# Patient Record
Sex: Female | Born: 1975
Health system: Southern US, Community
[De-identification: ages and names within clinical notes are randomized; demographics above are authoritative.]

## PROBLEM LIST (undated history)

## (undated) DIAGNOSIS — K259 Gastric ulcer, unspecified as acute or chronic, without hemorrhage or perforation: Secondary | ICD-10-CM

## (undated) DIAGNOSIS — R51 Headache: Secondary | ICD-10-CM

## (undated) DIAGNOSIS — K449 Diaphragmatic hernia without obstruction or gangrene: Secondary | ICD-10-CM

## (undated) DIAGNOSIS — J45909 Unspecified asthma, uncomplicated: Secondary | ICD-10-CM

## (undated) DIAGNOSIS — I10 Essential (primary) hypertension: Secondary | ICD-10-CM

## (undated) DIAGNOSIS — M726 Necrotizing fasciitis: Secondary | ICD-10-CM

## (undated) DIAGNOSIS — R519 Headache, unspecified: Secondary | ICD-10-CM

## (undated) DIAGNOSIS — I82409 Acute embolism and thrombosis of unspecified deep veins of unspecified lower extremity: Secondary | ICD-10-CM

## (undated) DIAGNOSIS — F419 Anxiety disorder, unspecified: Secondary | ICD-10-CM

## (undated) DIAGNOSIS — D649 Anemia, unspecified: Secondary | ICD-10-CM

## (undated) DIAGNOSIS — I2699 Other pulmonary embolism without acute cor pulmonale: Secondary | ICD-10-CM

## (undated) DIAGNOSIS — R000 Tachycardia, unspecified: Secondary | ICD-10-CM

## (undated) HISTORY — DX: Headache, unspecified: R51.9

## (undated) HISTORY — PX: CHOLECYSTECTOMY: SHX55

## (undated) HISTORY — DX: Anxiety disorder, unspecified: F41.9

## (undated) HISTORY — DX: Headache: R51

## (undated) SURGERY — IRRIGATION AND DEBRIDEMENT BUTTOCKS
Anesthesia: General | Laterality: Left

---

## 2013-05-11 ENCOUNTER — Emergency Department (HOSPITAL_COMMUNITY)
Admission: EM | Admit: 2013-05-11 | Discharge: 2013-05-11 | Disposition: A | Discharge status: Home / Self Care | Payer: 59 | Attending: Emergency Medicine | Admitting: Emergency Medicine

## 2013-05-11 ENCOUNTER — Encounter (HOSPITAL_COMMUNITY): Payer: Self-pay | Admitting: *Deleted

## 2013-05-11 DIAGNOSIS — Z8711 Personal history of peptic ulcer disease: Secondary | ICD-10-CM | POA: Insufficient documentation

## 2013-05-11 DIAGNOSIS — Z9889 Other specified postprocedural states: Secondary | ICD-10-CM | POA: Insufficient documentation

## 2013-05-11 DIAGNOSIS — I10 Essential (primary) hypertension: Secondary | ICD-10-CM | POA: Insufficient documentation

## 2013-05-11 DIAGNOSIS — Z862 Personal history of diseases of the blood and blood-forming organs and certain disorders involving the immune mechanism: Secondary | ICD-10-CM | POA: Insufficient documentation

## 2013-05-11 DIAGNOSIS — R109 Unspecified abdominal pain: Secondary | ICD-10-CM | POA: Insufficient documentation

## 2013-05-11 DIAGNOSIS — Z872 Personal history of diseases of the skin and subcutaneous tissue: Secondary | ICD-10-CM | POA: Insufficient documentation

## 2013-05-11 DIAGNOSIS — J45909 Unspecified asthma, uncomplicated: Secondary | ICD-10-CM | POA: Insufficient documentation

## 2013-05-11 DIAGNOSIS — K259 Gastric ulcer, unspecified as acute or chronic, without hemorrhage or perforation: Secondary | ICD-10-CM | POA: Insufficient documentation

## 2013-05-11 DIAGNOSIS — Z8719 Personal history of other diseases of the digestive system: Secondary | ICD-10-CM | POA: Insufficient documentation

## 2013-05-11 DIAGNOSIS — Z86711 Personal history of pulmonary embolism: Secondary | ICD-10-CM | POA: Insufficient documentation

## 2013-05-11 DIAGNOSIS — N390 Urinary tract infection, site not specified: Secondary | ICD-10-CM | POA: Insufficient documentation

## 2013-05-11 DIAGNOSIS — Z8679 Personal history of other diseases of the circulatory system: Secondary | ICD-10-CM | POA: Insufficient documentation

## 2013-05-11 DIAGNOSIS — Z9089 Acquired absence of other organs: Secondary | ICD-10-CM | POA: Insufficient documentation

## 2013-05-11 DIAGNOSIS — Z3202 Encounter for pregnancy test, result negative: Secondary | ICD-10-CM | POA: Insufficient documentation

## 2013-05-11 HISTORY — DX: Unspecified asthma, uncomplicated: J45.909

## 2013-05-11 HISTORY — DX: Other pulmonary embolism without acute cor pulmonale: I26.99

## 2013-05-11 HISTORY — DX: Essential (primary) hypertension: I10

## 2013-05-11 HISTORY — DX: Gastric ulcer, unspecified as acute or chronic, without hemorrhage or perforation: K25.9

## 2013-05-11 HISTORY — DX: Diaphragmatic hernia without obstruction or gangrene: K44.9

## 2013-05-11 HISTORY — DX: Tachycardia, unspecified: R00.0

## 2013-05-11 HISTORY — DX: Anemia, unspecified: D64.9

## 2013-05-11 LAB — LIPASE, BLOOD: Lipase: 24 U/L (ref 11–59)

## 2013-05-11 LAB — COMPREHENSIVE METABOLIC PANEL
Albumin: 3.8 g/dL (ref 3.5–5.2)
BUN: 13 mg/dL (ref 6–23)
Creatinine, Ser: 0.63 mg/dL (ref 0.50–1.10)
Total Bilirubin: 0.2 mg/dL — ABNORMAL LOW (ref 0.3–1.2)
Total Protein: 8.2 g/dL (ref 6.0–8.3)

## 2013-05-11 LAB — CBC WITH DIFFERENTIAL/PLATELET
Basophils Relative: 0 % (ref 0–1)
Eosinophils Absolute: 0.4 10*3/uL (ref 0.0–0.7)
HCT: 41.1 % (ref 36.0–46.0)
Hemoglobin: 13.5 g/dL (ref 12.0–15.0)
MCH: 31.8 pg (ref 26.0–34.0)
MCHC: 32.8 g/dL (ref 30.0–36.0)
Monocytes Absolute: 0.7 10*3/uL (ref 0.1–1.0)
Monocytes Relative: 6 % (ref 3–12)

## 2013-05-11 LAB — URINALYSIS, ROUTINE W REFLEX MICROSCOPIC
Ketones, ur: NEGATIVE mg/dL
Leukocytes, UA: NEGATIVE
Nitrite: POSITIVE — AB
Urobilinogen, UA: 0.2 mg/dL (ref 0.0–1.0)
pH: 6 (ref 5.0–8.0)

## 2013-05-11 LAB — URINE MICROSCOPIC-ADD ON

## 2013-05-11 MED ORDER — PROMETHAZINE HCL 25 MG PO TABS: 25.0000 mg | ORAL_TABLET | Freq: Four times a day (QID) | ORAL | Status: DC | PRN

## 2013-05-11 MED ORDER — ONDANSETRON 8 MG PO TBDP
8.0000 mg | ORAL_TABLET | Freq: Once | ORAL | Status: AC
  Administered 2013-05-11: 8 mg via ORAL
  Filled 2013-05-11: qty 1

## 2013-05-11 MED ORDER — PROMETHAZINE HCL 12.5 MG PO TABS
25.0000 mg | ORAL_TABLET | Freq: Once | ORAL | Status: AC
  Administered 2013-05-11: 25 mg via ORAL

## 2013-05-11 MED ORDER — SUCRALFATE 1 GM/10ML PO SUSP
1.0000 g | Freq: Once | ORAL | Status: AC
  Administered 2013-05-11: 1 g via ORAL
  Filled 2013-05-11: qty 10

## 2013-05-11 MED ORDER — CEPHALEXIN 500 MG PO CAPS: 500.0000 mg | ORAL_CAPSULE | Freq: Four times a day (QID) | ORAL | Status: DC

## 2013-05-11 MED ORDER — OXYCODONE-ACETAMINOPHEN 5-325 MG PO TABS
1.0000 | ORAL_TABLET | ORAL | Status: DC | PRN
Start: 2013-05-11 — End: 2014-12-22

## 2013-05-11 MED ORDER — SUCRALFATE 1 GM/10ML PO SUSP: 1.0000 g | Freq: Two times a day (BID) | ORAL | Status: DC

## 2013-05-11 MED ORDER — PROMETHAZINE HCL 25 MG RE SUPP: 25.0000 mg | Freq: Four times a day (QID) | RECTAL | Status: DC | PRN

## 2013-05-11 MED ORDER — OXYCODONE-ACETAMINOPHEN 5-325 MG PO TABS
2.0000 | ORAL_TABLET | Freq: Once | ORAL | Status: AC
  Administered 2013-05-11: 2 via ORAL
  Filled 2013-05-11 (×2): qty 1

## 2013-05-11 MED ORDER — PROMETHAZINE HCL 12.5 MG PO TABS
ORAL_TABLET | ORAL | Status: AC
  Filled 2013-05-11: qty 2

## 2013-05-11 NOTE — ED Notes (Signed)
MD at bedside. 

## 2013-05-11 NOTE — ED Notes (Signed)
Pt c/o nausea abd pain that became worse three days ago, has hx of "stomach ulcers',

## 2013-05-11 NOTE — ED Provider Notes (Signed)
History  This chart was scribed for [name], MD by Era Bumpers, ED scribe. This patient was seen in room APA06/APA06 and the patient's care was started at 1850.  CSN: [id number] Arrival date & time 05/11/13  [id number]  First MD Initiated Contact with Patient 05/11/13 1850     Chief Complaint  Patient presents with  . Abdominal Pain  . Nausea   Patient is a 37 y.o. female presenting with abdominal pain. The history is provided by the patient. No language interpreter was used.  Abdominal Pain This is a recurrent problem. The current episode started more than 2 days ago. The problem occurs constantly. The problem has been gradually worsening. Associated symptoms include abdominal pain (diffuse cramping abdominal pain). Pertinent negatives include no chest pain, no headaches and no shortness of breath. Nothing aggravates the symptoms. Nothing relieves the symptoms. She has tried nothing for the symptoms.   HPI Comments: Megan Ortiz is a 37 y.o. female who presents to the Emergency Department w/hx of stomach ulcers complaining of moderate, diffuse, constant, gradually worsening, cramping abdominal pain onset 3 days ago. She states a hx of ulcers and hx of bleeding ulcers. She states has had signs that her ulcers might be flaring up again since 3 days ago and states diffuse abdominal pain but denies emesis, nausea, black/bloody stool, fever, CP, SOB, cough, dysuria, vaginal bleeding or d/c. She is not on blood thinners (previous h/o PE). She has a hx of cholecystectomy, C-section. She has an upcoming GI appt w/Dr. Collene Mares  Past Medical History  Diagnosis Date  . Gastric ulcer   . Hypertension   . Tachycardia   . PE (pulmonary embolism)   . Asthma   . Anemia   . Hiatal hernia    Past Surgical History  Procedure Laterality Date  . Cholecystectomy    . Cesarean section     No family history on file. History  Substance Use Topics  . Smoking status: Never Smoker   . Smokeless  tobacco: Not on file  . Alcohol Use: Yes   OB History   Grav Para Term Preterm Abortions TAB SAB Ect Mult Living                 Review of Systems  Constitutional: Negative for fever and chills.  HENT: Negative for congestion and rhinorrhea.   Respiratory: Negative for cough and shortness of breath.   Cardiovascular: Negative for chest pain.  Gastrointestinal: Positive for abdominal pain (diffuse cramping abdominal pain). Negative for nausea, vomiting, diarrhea, blood in stool and anal bleeding.  Genitourinary: Negative for dysuria, frequency, flank pain, vaginal bleeding and vaginal discharge.  Skin: Negative for color change and pallor.  Neurological: Negative for syncope, weakness and headaches.  All other systems reviewed and are negative.   A complete 10 system review of systems was obtained and all systems are negative except as noted in the HPI and PMH.   Allergies  Review of patient's allergies indicates no known allergies.  Home Medications  No current outpatient prescriptions on file. Triage Vitals: BP 152/90  Pulse 94  Temp(Src) 97.8 F (36.6 C) (Oral)  Resp 18  Ht 5\' 5"  (1.651 m)  Wt 450 lb (204.119 kg)  BMI 74.88 kg/m2  SpO2 93%  LMP 04/10/2013 BP 141/65  Pulse 65  Temp(Src) 97.8 F (36.6 C) (Oral)  Resp 16  Ht 5\' 5"  (1.651 m)  Wt 450 lb (204.119 kg)  BMI 74.88 kg/m2  SpO2 95%  LMP 04/10/2013  Physical Exam CONSTITUTIONAL: Well developed/well nourished HEAD AND FACE: Normocephalic/atraumatic EYES: EOMI/PERRL ENMT: Mucous membranes moist NECK: supple no meningeal signs SPINE:entire spine nontender CV: S1/S2 noted, no murmurs/rubs/gallops noted LUNGS: Lungs are clear to auscultation bilaterally, no apparent distress ABDOMEN: soft, mild epigastric tenderness, no rebound or guarding, morbidly obese  Well healed scar to RUQ GU:no cva tenderness NEURO: Pt is awake/alert, moves all extremitiesx4 EXTREMITIES: pulses normal, full ROM SKIN: warm, color  normal PSYCH: no abnormalities of mood noted  ED Course  Procedures (including critical care time) DIAGNOSTIC STUDIES: Oxygen Saturation is 93% on room air, adequate by my interpretation.    COORDINATION OF CARE: At 53 PM Discussed treatment plan with patient which includes zofran, blood wok, UA. Patient agrees.   Labs Reviewed  CBC WITH DIFFERENTIAL  COMPREHENSIVE METABOLIC PANEL  LIPASE, BLOOD  URINALYSIS, ROUTINE W REFLEX MICROSCOPIC  LACTIC ACID, PLASMA   Pt improved, no vomiting.  Abdominal pain resolved.  Her abdomen was nontender on repeat assessment She had no lower abdominal tenderness on exam No CP reported She reports similar to prior episodes.  I doubt acute abdominal process She requests phenergen, carafate and pain meds Return precautions given to patient   MDM  Nursing notes including past medical history and social history reviewed and considered in documentation Labs/vital reviewed and considered     Date: 05/11/2013  Rate: 59  Rhythm: sinus bradycardia  QRS Axis: normal  Intervals: normal  ST/T Wave abnormalities: normal  Conduction Disutrbances:none  Narrative Interpretation:   Old EKG Reviewed: none available at time of interpretation    I personally performed the services described in this documentation, which was scribed in my presence. The recorded information has been reviewed and is accurate.        [name], MD 05/12/13 Quentin Mulling

## 2013-05-13 LAB — URINE CULTURE: Colony Count: >=100000

## 2013-05-14 NOTE — ED Notes (Signed)
+   Urine Treated with Cephalexin -STS-Chart appended per protocol MD.

## 2013-06-11 ENCOUNTER — Emergency Department (HOSPITAL_COMMUNITY)
Admission: EM | Admit: 2013-06-11 | Discharge: 2013-06-11 | Disposition: A | Discharge status: Home / Self Care | Payer: 59 | Attending: Emergency Medicine | Admitting: Emergency Medicine

## 2013-06-11 ENCOUNTER — Encounter (HOSPITAL_COMMUNITY): Payer: Self-pay | Admitting: Emergency Medicine

## 2013-06-11 DIAGNOSIS — Z79899 Other long term (current) drug therapy: Secondary | ICD-10-CM | POA: Insufficient documentation

## 2013-06-11 DIAGNOSIS — Z23 Encounter for immunization: Secondary | ICD-10-CM | POA: Insufficient documentation

## 2013-06-11 DIAGNOSIS — Y929 Unspecified place or not applicable: Secondary | ICD-10-CM | POA: Insufficient documentation

## 2013-06-11 DIAGNOSIS — Z8679 Personal history of other diseases of the circulatory system: Secondary | ICD-10-CM | POA: Insufficient documentation

## 2013-06-11 DIAGNOSIS — W260XXA Contact with knife, initial encounter: Secondary | ICD-10-CM | POA: Insufficient documentation

## 2013-06-11 DIAGNOSIS — Z86711 Personal history of pulmonary embolism: Secondary | ICD-10-CM | POA: Insufficient documentation

## 2013-06-11 DIAGNOSIS — Y939 Activity, unspecified: Secondary | ICD-10-CM | POA: Insufficient documentation

## 2013-06-11 DIAGNOSIS — S61209A Unspecified open wound of unspecified finger without damage to nail, initial encounter: Secondary | ICD-10-CM | POA: Insufficient documentation

## 2013-06-11 DIAGNOSIS — I10 Essential (primary) hypertension: Secondary | ICD-10-CM | POA: Insufficient documentation

## 2013-06-11 DIAGNOSIS — S61219A Laceration without foreign body of unspecified finger without damage to nail, initial encounter: Secondary | ICD-10-CM

## 2013-06-11 DIAGNOSIS — Z862 Personal history of diseases of the blood and blood-forming organs and certain disorders involving the immune mechanism: Secondary | ICD-10-CM | POA: Insufficient documentation

## 2013-06-11 DIAGNOSIS — J45909 Unspecified asthma, uncomplicated: Secondary | ICD-10-CM | POA: Insufficient documentation

## 2013-06-11 DIAGNOSIS — Z8719 Personal history of other diseases of the digestive system: Secondary | ICD-10-CM | POA: Insufficient documentation

## 2013-06-11 MED ORDER — TETANUS-DIPHTH-ACELL PERTUSSIS 5-2.5-18.5 LF-MCG/0.5 IM SUSP
0.5000 mL | Freq: Once | INTRAMUSCULAR | Status: AC
  Administered 2013-06-11: 0.5 mL via INTRAMUSCULAR
  Filled 2013-06-11: qty 0.5

## 2013-06-11 MED ORDER — TRAMADOL HCL 50 MG PO TABS: 50.0000 mg | ORAL_TABLET | Freq: Four times a day (QID) | ORAL | Status: DC | PRN

## 2013-06-11 MED ORDER — LIDOCAINE HCL (PF) 1 % IJ SOLN
INTRAMUSCULAR | Status: AC
  Filled 2013-06-11: qty 5

## 2013-06-11 NOTE — ED Provider Notes (Signed)
CSN: [id number]     Arrival date & time 06/11/13  1249 History     First MD Initiated Contact with Patient 06/11/13 1332     Chief Complaint  Patient presents with  . Laceration   (Consider location/radiation/quality/duration/timing/severity/associated sxs/prior Treatment) Patient is a 37 y.o. female presenting with skin laceration. The history is provided by the patient.  Laceration Location:  Finger Finger laceration location:  L thumb Length (cm):  1 cm Depth:  Cutaneous Quality comment:  Flap Bleeding: controlled   Time since incident:  1 hour Laceration mechanism:  Knife Pain details:    Quality:  Throbbing   Severity:  Mild   Timing:  Constant   Progression:  Unchanged Foreign body present:  No foreign bodies Relieved by:  Pressure Worsened by:  Movement Ineffective treatments:  None tried Tetanus status:  Out of date   Past Medical History  Diagnosis Date  . Gastric ulcer   . Hypertension   . Tachycardia   . PE (pulmonary embolism)   . Asthma   . Anemia   . Hiatal hernia    Past Surgical History  Procedure Laterality Date  . Cholecystectomy    . Cesarean section     History reviewed. No pertinent family history. History  Substance Use Topics  . Smoking status: Never Smoker   . Smokeless tobacco: Not on file  . Alcohol Use: Yes     Comment: occa   OB History   Grav Para Term Preterm Abortions TAB SAB Ect Mult Living                 Review of Systems  Constitutional: Negative for fever and chills.  Musculoskeletal: Negative for back pain, joint swelling and arthralgias.  Skin: Positive for wound.       Laceration   Neurological: Negative for dizziness, weakness and numbness.  Hematological: Does not bruise/bleed easily.  All other systems reviewed and are negative.    Allergies  Review of patient's allergies indicates no known allergies.  Home Medications   Current Outpatient Rx  Name  Route  Sig  Dispense  Refill  . albuterol  (VENTOLIN HFA) 108 (90 BASE) MCG/ACT inhaler   Inhalation   Inhale 2 puffs into the lungs every 6 (six) hours as needed for wheezing.         . cephALEXin (KEFLEX) 500 MG capsule   Oral   Take 1 capsule (500 mg total) by mouth 4 (four) times daily.   28 capsule   0   . LISINOPRIL PO   Oral   Take 1 tablet by mouth daily.         . montelukast (SINGULAIR) 10 MG tablet   Oral   Take 10 mg by mouth daily.         . Multiple Vitamin (MULTIVITAMIN WITH MINERALS) TABS   Oral   Take 1 tablet by mouth daily.         Marland Kitchen NORTRIPTYLINE HCL PO   Oral   Take 1 tablet by mouth at bedtime.         Marland Kitchen omeprazole (PRILOSEC) 20 MG capsule   Oral   Take 20-40 mg by mouth 3 (three) times daily.         Marland Kitchen oxyCODONE-acetaminophen (PERCOCET/ROXICET) 5-325 MG per tablet   Oral   Take 1 tablet by mouth every 4 (four) hours as needed for pain.   5 tablet   0   . promethazine (PHENERGAN) 25 MG suppository  Rectal   Place 1 suppository (25 mg total) rectally every 6 (six) hours as needed for nausea.   12 each   0   . promethazine (PHENERGAN) 25 MG tablet   Oral   Take 1 tablet (25 mg total) by mouth every 6 (six) hours as needed for nausea.   30 tablet   0   . sertraline (ZOLOFT) 50 MG tablet   Oral   Take 50 mg by mouth daily.         Marland Kitchen spironolactone (ALDACTONE) 50 MG tablet   Oral   Take 50 mg by mouth daily.         . sucralfate (CARAFATE) 1 GM/10ML suspension   Oral   Take 10 mLs (1 g total) by mouth 2 (two) times daily.   420 mL   0   . Topiramate (TOPAMAX PO)   Oral   Take 2 tablets by mouth at bedtime.          BP 122/80  Pulse 103  Temp(Src) 98.5 F (36.9 C) (Oral)  Resp 20  SpO2 94%  LMP 05/21/2013 Physical Exam  Nursing note and vitals reviewed. Constitutional: She is oriented to person, place, and time. She appears well-developed and well-nourished. No distress.  HENT:  Head: Normocephalic and atraumatic.  Cardiovascular: Normal rate,  regular rhythm, normal heart sounds and intact distal pulses.   No murmur heard. Pulmonary/Chest: Effort normal and breath sounds normal. No respiratory distress.  Musculoskeletal: She exhibits no edema and no tenderness.       Left hand: She exhibits tenderness and laceration. She exhibits normal range of motion, no bony tenderness, normal two-point discrimination, normal capillary refill, no deformity and no swelling. Normal sensation noted. Normal strength noted.       Hands: Neurological: She is alert and oriented to person, place, and time. She exhibits normal muscle tone. Coordination normal.  Skin: Skin is warm. Laceration noted.  See MS exam    ED Course   Procedures (including critical care time)  Labs Reviewed - No data to display No results found. No diagnosis found.  MDM   LACERATION REPAIR Performed by: Taryll Reichenberger L. Authorized by: Hale Bogus Consent: Verbal consent obtained. Risks and benefits: risks, benefits and alternatives were discussed Consent given by: patient Patient identity confirmed: provided demographic data Prepped and Draped in normal sterile fashion Wound explored  Laceration Location:distal left thumb Laceration Length: 1 cm  No Foreign Bodies seen or palpated    Local anesthetic: none   Irrigation method: syringe Amount of cleaning: standard  Skin closure: steri-strips     Patient tolerance: Patient tolerated the procedure well with no immediate complications.   1 cm flap type laceration to the ulnar aspect of distal left thumb.  No edema, NV intact.  Full ROM of the thumb.  Bleeding controlled, dressing applied.  Td updated.     Deontaye Civello L. Reuel Lamadrid, PA-C 06/13/13 1324

## 2013-06-11 NOTE — ED Notes (Signed)
Pt cut left thumb on knife prior to arrival. Bleeding controlled.

## 2013-06-17 NOTE — ED Provider Notes (Signed)
Medical screening examination/treatment/procedure(s) were performed by non-physician practitioner and as supervising physician I was immediately available for consultation/collaboration.  Varney Biles, MD 06/17/13 [phone]

## 2014-12-22 ENCOUNTER — Ambulatory Visit (INDEPENDENT_AMBULATORY_CARE_PROVIDER_SITE_OTHER): Payer: 59 | Admitting: Psychiatry

## 2014-12-22 ENCOUNTER — Encounter (HOSPITAL_COMMUNITY): Payer: Self-pay | Admitting: Psychiatry

## 2014-12-22 VITALS — BP 130/84 | HR 93 | Ht 65.0 in | Wt >= 6400 oz

## 2014-12-22 DIAGNOSIS — F411 Generalized anxiety disorder: Secondary | ICD-10-CM

## 2014-12-22 MED ORDER — SERTRALINE HCL 100 MG PO TABS: 100.0000 mg | ORAL_TABLET | Freq: Two times a day (BID) | ORAL | Status: DC
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
## 2015-01-19 ENCOUNTER — Encounter (HOSPITAL_COMMUNITY): Payer: Self-pay | Admitting: Psychiatry

## 2015-01-19 ENCOUNTER — Ambulatory Visit (INDEPENDENT_AMBULATORY_CARE_PROVIDER_SITE_OTHER): Payer: 59 | Admitting: Psychiatry

## 2015-01-19 VITALS — BP 138/90 | HR 86 | Ht 65.0 in | Wt >= 6400 oz

## 2015-01-19 DIAGNOSIS — F411 Generalized anxiety disorder: Secondary | ICD-10-CM

## 2015-01-19 MED ORDER — SERTRALINE HCL 100 MG PO TABS: 100.0000 mg | ORAL_TABLET | Freq: Two times a day (BID) | ORAL | Status: AC
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
## 2015-02-24 ENCOUNTER — Telehealth (HOSPITAL_COMMUNITY): Payer: Self-pay | Admitting: *Deleted

## 2015-03-19 ENCOUNTER — Ambulatory Visit (HOSPITAL_COMMUNITY): Payer: Self-pay | Admitting: Psychiatry

## 2017-02-15 ENCOUNTER — Encounter (HOSPITAL_COMMUNITY): Payer: Self-pay

## 2017-02-15 ENCOUNTER — Emergency Department (HOSPITAL_COMMUNITY): Payer: Self-pay

## 2017-02-15 ENCOUNTER — Inpatient Hospital Stay (HOSPITAL_COMMUNITY)
Admission: EM | Admit: 2017-02-15 | Discharge: 2017-03-09 | DRG: 853 | Disposition: A | Discharge status: Skilled Nursing Facility | Payer: Self-pay | Attending: Internal Medicine | Admitting: Internal Medicine

## 2017-02-15 DIAGNOSIS — Z8711 Personal history of peptic ulcer disease: Secondary | ICD-10-CM

## 2017-02-15 DIAGNOSIS — Z978 Presence of other specified devices: Secondary | ICD-10-CM

## 2017-02-15 DIAGNOSIS — L304 Erythema intertrigo: Secondary | ICD-10-CM | POA: Diagnosis present

## 2017-02-15 DIAGNOSIS — D509 Iron deficiency anemia, unspecified: Secondary | ICD-10-CM | POA: Diagnosis present

## 2017-02-15 DIAGNOSIS — Z9049 Acquired absence of other specified parts of digestive tract: Secondary | ICD-10-CM

## 2017-02-15 DIAGNOSIS — R6521 Severe sepsis with septic shock: Secondary | ICD-10-CM | POA: Diagnosis present

## 2017-02-15 DIAGNOSIS — E8809 Other disorders of plasma-protein metabolism, not elsewhere classified: Secondary | ICD-10-CM | POA: Diagnosis present

## 2017-02-15 DIAGNOSIS — J9601 Acute respiratory failure with hypoxia: Secondary | ICD-10-CM

## 2017-02-15 DIAGNOSIS — Z818 Family history of other mental and behavioral disorders: Secondary | ICD-10-CM

## 2017-02-15 DIAGNOSIS — T502X5A Adverse effect of carbonic-anhydrase inhibitors, benzothiadiazides and other diuretics, initial encounter: Secondary | ICD-10-CM | POA: Diagnosis present

## 2017-02-15 DIAGNOSIS — F418 Other specified anxiety disorders: Secondary | ICD-10-CM | POA: Diagnosis present

## 2017-02-15 DIAGNOSIS — M726 Necrotizing fasciitis: Secondary | ICD-10-CM | POA: Diagnosis present

## 2017-02-15 DIAGNOSIS — E870 Hyperosmolality and hypernatremia: Secondary | ICD-10-CM | POA: Diagnosis present

## 2017-02-15 DIAGNOSIS — R000 Tachycardia, unspecified: Secondary | ICD-10-CM | POA: Diagnosis present

## 2017-02-15 DIAGNOSIS — E871 Hypo-osmolality and hyponatremia: Secondary | ICD-10-CM | POA: Diagnosis present

## 2017-02-15 DIAGNOSIS — F329 Major depressive disorder, single episode, unspecified: Secondary | ICD-10-CM | POA: Diagnosis present

## 2017-02-15 DIAGNOSIS — K219 Gastro-esophageal reflux disease without esophagitis: Secondary | ICD-10-CM | POA: Diagnosis present

## 2017-02-15 DIAGNOSIS — D72829 Elevated white blood cell count, unspecified: Secondary | ICD-10-CM

## 2017-02-15 DIAGNOSIS — D62 Acute posthemorrhagic anemia: Secondary | ICD-10-CM

## 2017-02-15 DIAGNOSIS — Z811 Family history of alcohol abuse and dependence: Secondary | ICD-10-CM

## 2017-02-15 DIAGNOSIS — E876 Hypokalemia: Secondary | ICD-10-CM | POA: Diagnosis present

## 2017-02-15 DIAGNOSIS — Y95 Nosocomial condition: Secondary | ICD-10-CM | POA: Diagnosis present

## 2017-02-15 DIAGNOSIS — J9602 Acute respiratory failure with hypercapnia: Secondary | ICD-10-CM | POA: Diagnosis present

## 2017-02-15 DIAGNOSIS — Z4659 Encounter for fitting and adjustment of other gastrointestinal appliance and device: Secondary | ICD-10-CM

## 2017-02-15 DIAGNOSIS — E43 Unspecified severe protein-calorie malnutrition: Secondary | ICD-10-CM | POA: Diagnosis present

## 2017-02-15 DIAGNOSIS — E662 Morbid (severe) obesity with alveolar hypoventilation: Secondary | ICD-10-CM | POA: Diagnosis present

## 2017-02-15 DIAGNOSIS — B954 Other streptococcus as the cause of diseases classified elsewhere: Secondary | ICD-10-CM | POA: Diagnosis present

## 2017-02-15 DIAGNOSIS — L03317 Cellulitis of buttock: Secondary | ICD-10-CM | POA: Diagnosis present

## 2017-02-15 DIAGNOSIS — Z6841 Body Mass Index (BMI) 40.0 and over, adult: Secondary | ICD-10-CM

## 2017-02-15 DIAGNOSIS — R739 Hyperglycemia, unspecified: Secondary | ICD-10-CM

## 2017-02-15 DIAGNOSIS — G43909 Migraine, unspecified, not intractable, without status migrainosus: Secondary | ICD-10-CM | POA: Diagnosis present

## 2017-02-15 DIAGNOSIS — L98419 Non-pressure chronic ulcer of buttock with unspecified severity: Secondary | ICD-10-CM | POA: Diagnosis present

## 2017-02-15 DIAGNOSIS — J189 Pneumonia, unspecified organism: Secondary | ICD-10-CM | POA: Diagnosis present

## 2017-02-15 DIAGNOSIS — E872 Acidosis: Secondary | ICD-10-CM | POA: Diagnosis present

## 2017-02-15 DIAGNOSIS — I509 Heart failure, unspecified: Secondary | ICD-10-CM | POA: Diagnosis present

## 2017-02-15 DIAGNOSIS — Z86711 Personal history of pulmonary embolism: Secondary | ICD-10-CM

## 2017-02-15 DIAGNOSIS — A419 Sepsis, unspecified organism: Principal | ICD-10-CM | POA: Diagnosis present

## 2017-02-15 DIAGNOSIS — J9811 Atelectasis: Secondary | ICD-10-CM | POA: Diagnosis present

## 2017-02-15 DIAGNOSIS — Z972 Presence of dental prosthetic device (complete) (partial): Secondary | ICD-10-CM

## 2017-02-15 DIAGNOSIS — Z87891 Personal history of nicotine dependence: Secondary | ICD-10-CM

## 2017-02-15 DIAGNOSIS — F4323 Adjustment disorder with mixed anxiety and depressed mood: Secondary | ICD-10-CM

## 2017-02-15 DIAGNOSIS — J9690 Respiratory failure, unspecified, unspecified whether with hypoxia or hypercapnia: Secondary | ICD-10-CM

## 2017-02-15 DIAGNOSIS — Z79899 Other long term (current) drug therapy: Secondary | ICD-10-CM

## 2017-02-15 DIAGNOSIS — M7989 Other specified soft tissue disorders: Secondary | ICD-10-CM | POA: Diagnosis present

## 2017-02-15 DIAGNOSIS — I10 Essential (primary) hypertension: Secondary | ICD-10-CM

## 2017-02-15 DIAGNOSIS — Z886 Allergy status to analgesic agent status: Secondary | ICD-10-CM

## 2017-02-15 DIAGNOSIS — N179 Acute kidney failure, unspecified: Secondary | ICD-10-CM

## 2017-02-15 DIAGNOSIS — I110 Hypertensive heart disease with heart failure: Secondary | ICD-10-CM | POA: Diagnosis present

## 2017-02-15 DIAGNOSIS — R197 Diarrhea, unspecified: Secondary | ICD-10-CM | POA: Diagnosis present

## 2017-02-15 DIAGNOSIS — J45909 Unspecified asthma, uncomplicated: Secondary | ICD-10-CM

## 2017-02-15 DIAGNOSIS — L0231 Cutaneous abscess of buttock: Secondary | ICD-10-CM | POA: Diagnosis present

## 2017-02-15 LAB — CBC WITH DIFFERENTIAL/PLATELET
BASOS ABS: 0 10*3/uL (ref 0.0–0.1)
Basophils Relative: 0 %
EOS ABS: 0 10*3/uL (ref 0.0–0.7)
EOS PCT: 0 %
HEMATOCRIT: 30.6 % — AB (ref 36.0–46.0)
Hemoglobin: 9.8 g/dL — ABNORMAL LOW (ref 12.0–15.0)
Lymphocytes Relative: 3 %
Lymphs Abs: 1 10*3/uL (ref 0.7–4.0)
MCH: 27.7 pg (ref 26.0–34.0)
MCHC: 32 g/dL (ref 30.0–36.0)
MCV: 86.4 fL (ref 78.0–100.0)
Monocytes Absolute: 1.7 10*3/uL — ABNORMAL HIGH (ref 0.1–1.0)
Monocytes Relative: 4 %
Neutro Abs: 36.6 10*3/uL — ABNORMAL HIGH (ref 1.7–7.7)
Neutrophils Relative %: 93 %
PLATELETS: 343 10*3/uL (ref 150–400)
RBC: 3.54 MIL/uL — AB (ref 3.87–5.11)
RDW: 16.9 % — AB (ref 11.5–15.5)
WBC: 39.2 10*3/uL — AB (ref 4.0–10.5)

## 2017-02-15 LAB — COMPREHENSIVE METABOLIC PANEL
ALBUMIN: 3 g/dL — AB (ref 3.5–5.0)
ALT: 19 U/L (ref 14–54)
ANION GAP: 13 (ref 5–15)
AST: 33 U/L (ref 15–41)
Alkaline Phosphatase: 69 U/L (ref 38–126)
BUN: 31 mg/dL — AB (ref 6–20)
CHLORIDE: 104 mmol/L (ref 101–111)
CO2: 15 mmol/L — AB (ref 22–32)
Calcium: 9 mg/dL (ref 8.9–10.3)
Creatinine, Ser: 2.41 mg/dL — ABNORMAL HIGH (ref 0.44–1.00)
GFR calc Af Amer: 28 mL/min — ABNORMAL LOW (ref >60)
GFR calc non Af Amer: 24 mL/min — ABNORMAL LOW (ref >60)
GLUCOSE: 109 mg/dL — AB (ref 65–99)
POTASSIUM: 3.5 mmol/L (ref 3.5–5.1)
SODIUM: 132 mmol/L — AB (ref 135–145)
TOTAL PROTEIN: 7.3 g/dL (ref 6.5–8.1)
Total Bilirubin: 0.8 mg/dL (ref 0.3–1.2)

## 2017-02-15 LAB — BASIC METABOLIC PANEL
BUN: 31 mg/dL — AB (ref 4–21)
Creatinine: 2.4 mg/dL — AB (ref 0.5–1.1)
GLUCOSE: 109 mg/dL
POTASSIUM: 3.5 mmol/L (ref 3.4–5.3)
SODIUM: 132 mmol/L — AB (ref 137–147)

## 2017-02-15 LAB — CBC AND DIFFERENTIAL
HCT: 31 % — AB (ref 36–46)
HEMOGLOBIN: 9.8 g/dL — AB (ref 12.0–16.0)
Platelets: 343 10*3/uL (ref 150–399)
WBC: 39.2 10^3/mL

## 2017-02-15 LAB — I-STAT BETA HCG BLOOD, ED (MC, WL, AP ONLY): I-stat hCG, quantitative: 8 m[IU]/mL — ABNORMAL HIGH (ref <5)

## 2017-02-15 LAB — HEPATIC FUNCTION PANEL
ALK PHOS: 69 U/L (ref 25–125)
ALT: 19 U/L (ref 7–35)
AST: 33 U/L (ref 13–35)
Bilirubin, Total: 0.8 mg/dL

## 2017-02-15 LAB — I-STAT CG4 LACTIC ACID, ED: Lactic Acid, Venous: 4.69 mmol/L (ref 0.5–1.9)

## 2017-02-15 MED ORDER — SODIUM CHLORIDE 0.9 % IV BOLUS (SEPSIS)
1000.0000 mL | Freq: Once | INTRAVENOUS | Status: AC
  Administered 2017-02-15: 1000 mL via INTRAVENOUS

## 2017-02-15 MED ORDER — VANCOMYCIN HCL IN DEXTROSE 1-5 GM/200ML-% IV SOLN
1000.0000 mg | Freq: Once | INTRAVENOUS | Status: AC
Start: 2017-02-15 — End: 2017-02-15
  Administered 2017-02-15: 1000 mg via INTRAVENOUS
  Filled 2017-02-15: qty 200

## 2017-02-15 MED ORDER — ONDANSETRON 4 MG PO TBDP
ORAL_TABLET | ORAL | Status: AC
  Filled 2017-02-15: qty 1

## 2017-02-15 MED ORDER — NOREPINEPHRINE 4 MG/250ML-% IV SOLN
INTRAVENOUS | Status: AC
  Filled 2017-02-15: qty 250

## 2017-02-15 MED ORDER — HYDROMORPHONE HCL 1 MG/ML IJ SOLN
1.0000 mg | Freq: Once | INTRAMUSCULAR | Status: AC
  Administered 2017-02-15: 1 mg via INTRAVENOUS
  Filled 2017-02-15: qty 1

## 2017-02-15 MED ORDER — CLINDAMYCIN PHOSPHATE 600 MG/50ML IV SOLN
600.0000 mg | Freq: Once | INTRAVENOUS | Status: AC
  Administered 2017-02-15: 600 mg via INTRAVENOUS
  Filled 2017-02-15: qty 50

## 2017-02-15 MED ORDER — ACETAMINOPHEN 500 MG PO TABS
1000.0000 mg | ORAL_TABLET | Freq: Once | ORAL | Status: AC
  Administered 2017-02-15: 1000 mg via ORAL
  Filled 2017-02-15: qty 2

## 2017-02-15 MED ORDER — PIPERACILLIN-TAZOBACTAM 3.375 G IVPB 30 MIN
3.3750 g | Freq: Once | INTRAVENOUS | Status: AC
Start: 2017-02-15 — End: 2017-02-15
  Administered 2017-02-15: 3.375 g via INTRAVENOUS
  Filled 2017-02-15: qty 50

## 2017-02-15 MED ORDER — ONDANSETRON 4 MG PO TBDP
4.0000 mg | ORAL_TABLET | Freq: Once | ORAL | Status: AC | PRN
  Administered 2017-02-15: 4 mg via ORAL

## 2017-02-15 MED ORDER — SODIUM CHLORIDE 0.9 % IV SOLN: INTRAVENOUS | Status: DC | PRN

## 2017-02-15 MED ORDER — DEXTROSE 5 % IV SOLN
0.0000 ug/min | INTRAVENOUS | Status: DC
  Administered 2017-02-16: 2 ug/min via INTRAVENOUS
  Filled 2017-02-15: qty 4

## 2017-02-15 NOTE — Consult Note (Addendum)
SURGICAL CONSULTATION NOTE (initial) - cpt: 99254  HISTORY OF PRESENT ILLNESS (HPI):  41 y.o. supermorbidly obese female presented to [facility] ED with Left buttock pain x 4 days, which she attributed to scrubbing too hard with an abrasive "sugar scrub". 3 days ago, she began experiencing subjective fever and shaking chills. 2 days ago, she first noticed a blister over the painful site, and today she began feeling significantly worse pain, fevers, nausea, and overall poor. The painful blistered site also today began to turn a dark gray/black color, and the affected area grew larger throughout today. Despite weighing 450 lbs, patient denies history of diabetes, and patient likewise denies any prior skin infection except for wound infection following cholecystectomy. Though automated BP cuff initially reported SBP of 63 mmHg, patient denied dizziness or lightheadedness and likewise denied CP, SOB, cough, abdominal pain, or dysuria. ED physician reports CT scan was requested, but patient exceeds the weight limit +/- size limitation of the CT scanner.  Surgery is consulted by ED physician Dr. Oleta Mouse in this context for evaluation and management of likely necrotizing fasciitis.  PAST MEDICAL HISTORY (PMH):  Past Medical History:  Diagnosis Date  . Anemia   . Anxiety   . Asthma   . Gastric ulcer   . Headache   . Hiatal hernia   . Hypertension   . PE (pulmonary embolism)   . Tachycardia      PAST SURGICAL HISTORY [facility]):  Past Surgical History:  Procedure Laterality Date  . CESAREAN SECTION    . CHOLECYSTECTOMY       MEDICATIONS:  Prior to Admission medications   Medication Sig Start Date End Date Taking? Authorizing Provider  acetaminophen (TYLENOL) 500 MG tablet Take 500 mg by mouth every 6 (six) hours as needed for moderate pain.   Yes Historical Provider, MD  albuterol (VENTOLIN HFA) 108 (90 BASE) MCG/ACT inhaler Inhale 2 puffs into the lungs every 6 (six) hours as needed for wheezing.   Yes  Historical Provider, MD  cyclobenzaprine (FLEXERIL) 10 MG tablet Take 10 mg by mouth 3 (three) times daily as needed for muscle spasms.  12/24/16  Yes Historical Provider, MD  diltiazem (CARDIZEM CD) 180 MG 24 hr capsule Take 180 mg by mouth daily. 01/08/15  Yes Historical Provider, MD  lisinopril (PRINIVIL,ZESTRIL) 40 MG tablet Take 80 mg by mouth daily.   Yes Historical Provider, MD  montelukast (SINGULAIR) 10 MG tablet Take 10 mg by mouth daily.   Yes Historical Provider, MD  Multiple Vitamin (MULTIVITAMIN WITH MINERALS) TABS Take 1 tablet by mouth daily.   Yes Historical Provider, MD  nortriptyline (PAMELOR) 25 MG capsule Take 25 mg by mouth at bedtime.  11/24/14  Yes Historical Provider, MD  sertraline (ZOLOFT) 100 MG tablet Take 1 tablet (100 mg total) by mouth 2 (two) times daily. 01/19/15  Yes Cloria Spring, MD  spironolactone (ALDACTONE) 50 MG tablet Take 50 mg by mouth daily.   Yes Historical Provider, MD  sucralfate (CARAFATE) 1 g tablet Take 1 g by mouth 4 (four) times daily.  08/23/16  Yes Historical Provider, MD  topiramate (TOPAMAX) 50 MG tablet Take 50 mg by mouth daily. 01/24/17  Yes Historical Provider, MD     ALLERGIES:  Allergies  Allergen Reactions  . Aspirin     Due to hx of stomach ulcers     SOCIAL HISTORY:  Social History   Social History  . Marital status: Married    Spouse name: N/A  . Number of  children: N/A  . Years of education: N/A   Occupational History  . Not on file.   Social History Main Topics  . Smoking status: Former Research scientist (life sciences)  . Smokeless tobacco: Never Used     Comment: QUIT ABOUT 5 YEARS AGO  . Alcohol use Yes     Comment: occa  . Drug use: No  . Sexual activity: Yes    Birth control/ protection: None, Condom   Other Topics Concern  . Not on file   Social History Narrative  . No narrative on file    The patient currently resides (home / rehab facility / nursing home): Home  The patient normally is (ambulatory / bedbound): Ambulatory    FAMILY HISTORY:  Family History  Problem Relation Age of Onset  . Depression Mother   . Alcohol abuse Mother     REVIEW OF SYSTEMS:  Constitutional: denies weight loss, fever, chills, or sweats  Eyes: denies any other vision changes, history of eye injury  ENT: denies sore throat, hearing problems  Respiratory: denies shortness of breath, wheezing  Cardiovascular: denies chest pain, palpitations  Gastrointestinal: denies abdominal pain or N/V, reports +flatus with some loose BM's over the past few days Genitourinary: denies burning with urination or urinary frequency Musculoskeletal: denies any other joint pains or cramps  Skin: denies any other rashes or skin discolorations except as per HPI Neurological: denies any other headache, dizziness, weakness  Psychiatric: denies any other depression, anxiety   All other review of systems were negative   VITAL SIGNS:  Temp:  [98.1 F (36.7 C)-101.5 F (38.6 C)] 101.5 F (38.6 C) (04/05 1942) Pulse Rate:  [110-132] 129 (04/05 2245) Resp:  [22-31] 24 (04/05 2245) BP: (62-115)/(30-77) 77/43 (04/05 2245) SpO2:  [22 %-100 %] 93 % (04/05 2245) Weight:  [204.1 kg (450 lb)] 204.1 kg (450 lb) (04/05 1928)     Height: 5\' 5"  (165.1 cm) Weight: (!) 204.1 kg (450 lb) BMI (Calculated): 75   INTAKE/OUTPUT:  This shift: Total I/O In: 3050 [IV Piggyback:3050] Out: -   Last 2 shifts: @IOLAST2SHIFTS @   PHYSICAL EXAM:  Constitutional:  -- Supermorbidly obese body habitus  -- Awake, alert, and oriented x3; diaphoretic Eyes:  -- Pupils equally round and reactive to light  -- No scleral icterus  Ear, nose, and throat:  -- No jugular venous distension  Pulmonary:  -- No crackles  -- Equally reduced breath sounds bilaterally, attributable to body habitus -- Breathing non-labored at rest Cardiovascular:  -- S1, S2 present  -- No pericardial rubs Gastrointestinal:  -- Abdomen soft, nontender, supermorbidly obese, no guarding/rebound  -- No  abdominal masses appreciated, pulsatile or otherwise  Musculoskeletal and Integumentary:  -- Wounds or skin discoloration: moderately tender 8 cm x 5 cm grayish-colored Left buttock wound with possibly a small amount of crepitus, though exam is limited by patient's body habitus -- Extremities: B/L UE and LE FROM, hands and feet warm  Neurologic:  -- Motor function: intact and symmetric -- Sensation: intact and symmetric  Pulse/Doppler Exam: (p=palpable; d=doppler signals; 0=none)     Right   Left   Brach  p   p   Rad  p   p   Labs:  CBC:  Lab Results  Component Value Date   WBC 39.2 (H) 02/15/2017   RBC 3.54 (L) 02/15/2017   CMP Latest Ref Rng & Units 02/15/2017 05/11/2013  Glucose 65 - 99 mg/dL 109(H) 105(H)  BUN 6 - 20 mg/dL 31(H) 13  Creatinine  0.44 - 1.00 mg/dL 2.41(H) 0.63  Sodium 135 - 145 mmol/L 132(L) 135  Potassium 3.5 - 5.1 mmol/L 3.5 4.1  Chloride 101 - 111 mmol/L 104 100  CO2 22 - 32 mmol/L 15(L) 22  Calcium 8.9 - 10.3 mg/dL 9.0 9.6  Total Protein 6.5 - 8.1 g/dL 7.3 8.2  Total Bilirubin 0.3 - 1.2 mg/dL 0.8 0.2(L)  Alkaline Phos 38 - 126 U/L 69 75  AST 15 - 41 U/L 33 10  ALT 14 - 54 U/L 19 8    Imaging studies:  No pertinent imaging available to review  Assessment/Plan: (ICD-10's: M72.6, R65.21) 41 y.o. female with septic shock and leukocytosis likely attributable to Left buttock necrotizing soft tissue infection, complicated by pertinent comorbidities including supermorbid obesity (BMI 75), HTN, asthma, prior PE not currently on anticoagulation, GERD with PUD associated with hiatal hernia, and generalized anxiety disorder.   - NPO, IVF rescuscitation, vasopressors as per ED  - all risks, benefits, and alternatives to intra-arterial pressure monitoring catheter discussed with patient, and Right radial intra-arterial catheter sterilely inserted and secured with consistently excellent waveform and SBP of 78 - 84 mmHg  - it was explained to patient and her husband  that necrotizing soft tissue infection can be a fatal problem if not promptly treated and that debridement in the absence of imaging would be both diagnostic/confirmatory and likely therapeutic in this context  - if unable to perform emergent excisional debridement due to anesthesia safety concerns and limited critical care resources, patient will need to be transferred for critical care and surgical management asap  - please call if any questions  - DVT prophylaxis  All of the above findings and recommendations were discussed with the patient, her husband, and ED physician, and all of patient's and her husband's questions were answered to their expressed satisfaction.  Thank you for the opportunity to participate in this patient's care.   -- Marilynne Drivers Rosana Hoes, MD, [place]: [facility] Office: [phone]

## 2017-02-15 NOTE — ED Triage Notes (Signed)
Pt called [facility] for wound on her left buttock that the patient states she noticed 2 days ago.  Pt also c/o nausea and some diarrhea.

## 2017-02-15 NOTE — ED Provider Notes (Addendum)
[place] DEPT Provider Note   CSN: [id number] Arrival date & time: 02/15/17  1925     History   Chief Complaint Chief Complaint  Patient presents with  . Skin Ulcer    HPI Megan Ortiz is a 41 y.o. female.  HPI 41 year old female who presents with right buttock ulcer. She has a history of morbid obesity, migraine headaches, and hypertension. States that 3 days ago she noticed a small boil to her right buttock, and increasing pain. She has been feeling feverish, and yesterday evening began to have night sweats. Denies any nausea or vomiting, abdominal pain, dysuria or urinary frequency, chest pain, difficulty breathing or cough. Has noted mild diarrhea over the past day or 2.   Past Medical History:  Diagnosis Date  . Anemia   . Anxiety   . Asthma   . Gastric ulcer   . Headache   . Hiatal hernia   . Hypertension   . PE (pulmonary embolism)   . Tachycardia     Patient Active Problem List   Diagnosis Date Noted  . Septic shock ([place]) 02/16/2017  . Generalized anxiety disorder 12/22/2014    Past Surgical History:  Procedure Laterality Date  . CESAREAN SECTION    . CHOLECYSTECTOMY      OB History    No data available       Home Medications    Prior to Admission medications   Medication Sig Start Date End Date Taking? Authorizing Provider  acetaminophen (TYLENOL) 500 MG tablet Take 500 mg by mouth every 6 (six) hours as needed for moderate pain.   Yes Historical Provider, MD  albuterol (VENTOLIN HFA) 108 (90 BASE) MCG/ACT inhaler Inhale 2 puffs into the lungs every 6 (six) hours as needed for wheezing.   Yes Historical Provider, MD  cyclobenzaprine (FLEXERIL) 10 MG tablet Take 10 mg by mouth 3 (three) times daily as needed for muscle spasms.  12/24/16  Yes Historical Provider, MD  diltiazem (CARDIZEM CD) 180 MG 24 hr capsule Take 180 mg by mouth daily. 01/08/15  Yes Historical Provider, MD  lisinopril (PRINIVIL,ZESTRIL) 40 MG tablet Take 80 mg by mouth  daily.   Yes Historical Provider, MD  montelukast (SINGULAIR) 10 MG tablet Take 10 mg by mouth daily.   Yes Historical Provider, MD  Multiple Vitamin (MULTIVITAMIN WITH MINERALS) TABS Take 1 tablet by mouth daily.   Yes Historical Provider, MD  nortriptyline (PAMELOR) 25 MG capsule Take 25 mg by mouth at bedtime.  11/24/14  Yes Historical Provider, MD  sertraline (ZOLOFT) 100 MG tablet Take 1 tablet (100 mg total) by mouth 2 (two) times daily. 01/19/15  Yes Cloria Spring, MD  spironolactone (ALDACTONE) 50 MG tablet Take 50 mg by mouth daily.   Yes Historical Provider, MD  sucralfate (CARAFATE) 1 g tablet Take 1 g by mouth 4 (four) times daily.  08/23/16  Yes Historical Provider, MD  topiramate (TOPAMAX) 50 MG tablet Take 50 mg by mouth daily. 01/24/17  Yes Historical Provider, MD    Family History Family History  Problem Relation Age of Onset  . Depression Mother   . Alcohol abuse Mother     Social History Social History  Substance Use Topics  . Smoking status: Former Research scientist (life sciences)  . Smokeless tobacco: Never Used     Comment: QUIT ABOUT 5 YEARS AGO  . Alcohol use Yes     Comment: occa     Allergies   Aspirin   Review of Systems Review of Systems  Constitutional: Positive for fatigue and fever.  HENT: Negative for congestion.   Respiratory: Negative for cough and shortness of breath.   Cardiovascular: Negative for chest pain.  Gastrointestinal: Negative for abdominal pain and vomiting.  Genitourinary: Negative for difficulty urinating and dysuria.  Skin: Positive for wound.  Allergic/Immunologic: Negative for immunocompromised state.  Neurological: Negative for syncope.  Hematological: Does not bruise/bleed easily.  Psychiatric/Behavioral: Negative for confusion.  All other systems reviewed and are negative.    Physical Exam Updated Vital Signs BP (!) 77/43   Pulse (!) 129   Temp (!) 101.5 F (38.6 C) (Rectal)   Resp (!) 24   Ht 5\' 5"  (1.651 m)   Wt (!) 450 lb (204.1  kg)   LMP 02/01/2017 (Approximate)   SpO2 93%   BMI 74.88 kg/m   Physical Exam Physical Exam  Nursing note and vitals reviewed. Constitutional: non-toxic, and in no acute distress Head: Normocephalic and atraumatic.  Mouth/Throat: Oropharynx is clear and moist.  Neck: Normal range of motion. Neck supple.  Cardiovascular: tachycardic rate and regular rhythm.   Pulmonary/Chest: Effort normal and breath sounds normal.  Abdominal: Soft. Morbidly obese. There is no tenderness. There is no rebound and no guarding.  Musculoskeletal: Normal range of motion.  Neurological: Alert, no facial droop, fluent speech, moves all extremities symmetrically Skin: Skin is warm and dry. there is 4 x 4 cm pale colored ulcerated skin , there is surrounding erythema over right buttock Psychiatric: Cooperative   ED Treatments / Results  Labs (all labs ordered are listed, but only abnormal results are displayed) Labs Reviewed  CBC WITH DIFFERENTIAL/PLATELET - Abnormal; Notable for the following:       Result Value   WBC 39.2 (*)    RBC 3.54 (*)    Hemoglobin 9.8 (*)    HCT 30.6 (*)    RDW 16.9 (*)    Neutro Abs 36.6 (*)    Monocytes Absolute 1.7 (*)    All other components within normal limits  COMPREHENSIVE METABOLIC PANEL - Abnormal; Notable for the following:    Sodium 132 (*)    CO2 15 (*)    Glucose, Bld 109 (*)    BUN 31 (*)    Creatinine, Ser 2.41 (*)    Albumin 3.0 (*)    GFR calc non Af Amer 24 (*)    GFR calc Af Amer 28 (*)    All other components within normal limits  LACTIC ACID, PLASMA - Abnormal; Notable for the following:    Lactic Acid, Venous 2.6 (*)    All other components within normal limits  I-STAT CG4 LACTIC ACID, ED - Abnormal; Notable for the following:    Lactic Acid, Venous 4.69 (*)    All other components within normal limits  I-STAT BETA HCG BLOOD, ED (MC, WL, AP ONLY) - Abnormal; Notable for the following:    I-stat hCG, quantitative 8.0 (*)    All other  components within normal limits  CULTURE, BLOOD (ROUTINE X 2)  CULTURE, BLOOD (ROUTINE X 2)  URINALYSIS, ROUTINE W REFLEX MICROSCOPIC  LACTIC ACID, PLASMA  I-STAT CG4 LACTIC ACID, ED    EKG  EKG Interpretation None       Radiology Dg Chest Port 1 View  Result Date: 02/15/2017 CLINICAL DATA:  Possible sepsis. History of asthma, hypertension and pulmonary embolism. History tachycardia. EXAM: PORTABLE CHEST 1 VIEW COMPARISON:  None. FINDINGS: Study limited by the patient's positioning, AP technique and the patient's body habitus. Cardiac silhouette  is mildly enlarged. There is opacity at the left lung base. Mild hazy right mid to lower lung zone opacity is felt be due to the significant overlying soft tissues. No convincing pulmonary edema. No obvious pneumothorax.  No mediastinal or hilar masses. IMPRESSION: 1. Significantly limited study. 2. Mild enlargement of the cardiopericardial silhouette. 3. Possible pneumonia versus atelectasis at the left lung base. Electronically Signed   By: Lajean Manes M.D.   On: 02/15/2017 21:03    Procedures Procedures (including critical care time) Angiocath insertion Performed by: Forde Dandy  Consent: Verbal consent obtained. Risks and benefits: risks, benefits and alternatives were discussed Time out: Immediately prior to procedure a "time out" was called to verify the correct patient, procedure, equipment, support staff and site/side marked as required.  Preparation: Patient was prepped and draped in the usual sterile fashion.  Vein Location: right upper arm  Ultrasound Guided  Gauge: 20G  Normal blood return and flush without difficulty Patient tolerance: Patient tolerated the procedure well with no immediate complications.    CRITICAL CARE Performed by: Forde Dandy   Total critical care time: 60  minutes  Critical care time was exclusive of separately billable procedures and treating other patients.  Critical care was necessary  to treat or prevent imminent or life-threatening deterioration.  Critical care was time spent personally by me on the following activities: development of treatment plan with patient and/or surrogate as well as nursing, discussions with consultants, evaluation of patient's response to treatment, examination of patient, obtaining history from patient or surrogate, ordering and performing treatments and interventions, ordering and review of laboratory studies, ordering and review of radiographic studies, pulse oximetry and re-evaluation of patient's condition.  Medications Ordered in ED Medications  norepinephrine (LEVOPHED) 4 mg in dextrose 5 % 250 mL (0.016 mg/mL) infusion (not administered)  0.9 %  sodium chloride infusion (not administered)  ondansetron (ZOFRAN-ODT) disintegrating tablet 4 mg (4 mg Oral Given 02/15/17 1939)  acetaminophen (TYLENOL) tablet 1,000 mg (1,000 mg Oral Given 02/15/17 2029)  sodium chloride 0.9 % bolus 1,000 mL (0 mLs Intravenous Stopped 02/15/17 2352)  sodium chloride 0.9 % bolus 1,000 mL (0 mLs Intravenous Stopped 02/15/17 2348)  sodium chloride 0.9 % bolus 1,000 mL (0 mLs Intravenous Stopped 02/15/17 2029)  piperacillin-tazobactam (ZOSYN) IVPB 3.375 g (0 g Intravenous Stopped 02/15/17 2131)  vancomycin (VANCOCIN) IVPB 1000 mg/200 mL premix (0 mg Intravenous Stopped 02/15/17 2348)  HYDROmorphone (DILAUDID) injection 1 mg (1 mg Intravenous Given 02/15/17 2029)  sodium chloride 0.9 % bolus 1,000 mL (0 mLs Intravenous Stopped 02/15/17 2345)  clindamycin (CLEOCIN) IVPB 600 mg (0 mg Intravenous Stopped 02/15/17 2347)  sodium chloride 0.9 % bolus 1,000 mL (1,000 mLs Intravenous New Bag/Given 02/15/17 2352)  sodium chloride 0.9 % bolus 1,000 mL (1,000 mLs Intravenous New Bag/Given 02/15/17 2352)     Initial Impression / Assessment and Plan / ED Course  I have reviewed the triage vital signs and the nursing notes.  Pertinent labs & imaging results that were available during my care of the  patient were reviewed by me and considered in my medical decision making (see chart for details).     41 year old female who presents with concern for buttock wound. Presentation concerning for sepsis and cellulitis. She is febrile to 101.5 and tachycardic with heart rate of 129. She is in no respiratory distress. There is significant erythema, induration, and tenderness overlying the right buttock to the groin. There is an area that is 4 x 4  cm that is pale in appearance, but no significant crepitus initially noted.   Covered empirically with vancomycin and Zosyn and given 3 L of IV fluids initially.  On reevaluation of her wound, she has area of necrosis that is enlarging, with no palpable crepitus. Her blood work is concerning for leukocytosis of 39 and lactate of 4. Clindamycin also added.  Concerned that she may be having necrotizing soft tissue infection, and Dr. Rosana Hoes with consultation. Patient unable to fit in CT scanner for evaluation due to weight. He evaluated patient in the emergency department, and plan to take patient to operating room for debridement. However, anesthesia team did not feel comfortable operating on patient here in the emergency department. Spoke with Dr. Patsey Berthold from anesthesia regarding importance of debridment and concern for what delay in source control could mean for her. However, unable to have available anesthesia team who felt comfortable managing patient here.   During ED course does become hypotensive with systolic blood pressures in the 60s to 70s. Discussed with Dr. Rosana Hoes, who requested 2 additional liters of fluids.  She is mentating well, and has strong peripheral pulses. Due to her morbid obesity blood pressure measurements have been taken in her forearm, there is some uncertainty whether this may be true hypotension. After discussion, Dr. Rosana Hoes to place arterial line to obtain true BP before pressors. Initial a-line BPs 80s SBP, and pressors initiated  subsequently.  Spoke with Dr. Grandville Silos from [facility] who has agreed to see her. Discussed with Dr. Jimmy Footman who will admit to ICU at [facility].  Final Clinical Impressions(s) / ED Diagnoses   Final diagnoses:  Septic shock ([place])  Necrotizing soft tissue infection [facility])    New Prescriptions New Prescriptions   No medications on file     Forde Dandy, MD 02/16/17 0101    Forde Dandy, MD 02/16/17 [phone]

## 2017-02-16 ENCOUNTER — Encounter (HOSPITAL_COMMUNITY)
Admission: EM | Disposition: A | Discharge status: Skilled Nursing Facility | Payer: Self-pay | Source: Home / Self Care | Attending: Internal Medicine

## 2017-02-16 ENCOUNTER — Inpatient Hospital Stay (HOSPITAL_COMMUNITY): Payer: Self-pay | Admitting: Certified Registered Nurse Anesthetist

## 2017-02-16 ENCOUNTER — Encounter (HOSPITAL_COMMUNITY): Payer: Self-pay | Admitting: Anesthesiology

## 2017-02-16 ENCOUNTER — Inpatient Hospital Stay (HOSPITAL_COMMUNITY): Payer: Self-pay

## 2017-02-16 DIAGNOSIS — M7989 Other specified soft tissue disorders: Secondary | ICD-10-CM | POA: Diagnosis present

## 2017-02-16 DIAGNOSIS — N179 Acute kidney failure, unspecified: Secondary | ICD-10-CM

## 2017-02-16 DIAGNOSIS — A419 Sepsis, unspecified organism: Principal | ICD-10-CM

## 2017-02-16 DIAGNOSIS — R6521 Severe sepsis with septic shock: Secondary | ICD-10-CM

## 2017-02-16 DIAGNOSIS — L03317 Cellulitis of buttock: Secondary | ICD-10-CM

## 2017-02-16 HISTORY — PX: INCISION AND DRAINAGE ABSCESS: SHX5864

## 2017-02-16 LAB — URINALYSIS, ROUTINE W REFLEX MICROSCOPIC
Bilirubin Urine: NEGATIVE
Glucose, UA: NEGATIVE mg/dL
Hgb urine dipstick: NEGATIVE
Ketones, ur: NEGATIVE mg/dL
LEUKOCYTES UA: NEGATIVE
Nitrite: NEGATIVE
Protein, ur: NEGATIVE mg/dL
SPECIFIC GRAVITY, URINE: 1.026 (ref 1.005–1.030)
pH: 5 (ref 5.0–8.0)

## 2017-02-16 LAB — GLUCOSE, CAPILLARY
GLUCOSE-CAPILLARY: 111 mg/dL — AB (ref 65–99)
GLUCOSE-CAPILLARY: 122 mg/dL — AB (ref 65–99)
GLUCOSE-CAPILLARY: 130 mg/dL — AB (ref 65–99)
GLUCOSE-CAPILLARY: 80 mg/dL (ref 65–99)
Glucose-Capillary: 130 mg/dL — ABNORMAL HIGH (ref 65–99)
Glucose-Capillary: 163 mg/dL — ABNORMAL HIGH (ref 65–99)

## 2017-02-16 LAB — BASIC METABOLIC PANEL
Anion gap: 13 (ref 5–15)
BUN: 32 mg/dL — AB (ref 6–20)
CALCIUM: 8.2 mg/dL — AB (ref 8.9–10.3)
CHLORIDE: 105 mmol/L (ref 101–111)
CO2: 17 mmol/L — ABNORMAL LOW (ref 22–32)
CREATININE: 2.42 mg/dL — AB (ref 0.44–1.00)
GFR, EST AFRICAN AMERICAN: 27 mL/min — AB (ref >60)
GFR, EST NON AFRICAN AMERICAN: 24 mL/min — AB (ref >60)
Glucose, Bld: 95 mg/dL (ref 65–99)
Potassium: 3.7 mmol/L (ref 3.5–5.1)
SODIUM: 135 mmol/L (ref 135–145)

## 2017-02-16 LAB — PROCALCITONIN: PROCALCITONIN: 6.82 ng/mL

## 2017-02-16 LAB — I-STAT CG4 LACTIC ACID, ED: LACTIC ACID, VENOUS: 1.37 mmol/L (ref 0.5–1.9)

## 2017-02-16 LAB — CBC
HCT: 27.5 % — ABNORMAL LOW (ref 36.0–46.0)
HCT: 28.8 % — ABNORMAL LOW (ref 36.0–46.0)
Hemoglobin: 8.5 g/dL — ABNORMAL LOW (ref 12.0–15.0)
Hemoglobin: 8.9 g/dL — ABNORMAL LOW (ref 12.0–15.0)
MCH: 26.9 pg (ref 26.0–34.0)
MCH: 27.4 pg (ref 26.0–34.0)
MCHC: 30.9 g/dL (ref 30.0–36.0)
MCHC: 30.9 g/dL (ref 30.0–36.0)
MCV: 87 fL (ref 78.0–100.0)
MCV: 88.6 fL (ref 78.0–100.0)
PLATELETS: 425 10*3/uL — AB (ref 150–400)
Platelets: 376 10*3/uL (ref 150–400)
RBC: 3.16 MIL/uL — ABNORMAL LOW (ref 3.87–5.11)
RBC: 3.25 MIL/uL — AB (ref 3.87–5.11)
RDW: 17.2 % — AB (ref 11.5–15.5)
RDW: 17.9 % — ABNORMAL HIGH (ref 11.5–15.5)
WBC: 42.6 10*3/uL — ABNORMAL HIGH (ref 4.0–10.5)
WBC: 53.8 10*3/uL (ref 4.0–10.5)

## 2017-02-16 LAB — TRIGLYCERIDES: Triglycerides: 101 mg/dL (ref <150)

## 2017-02-16 LAB — TROPONIN I: TROPONIN I: 0.11 ng/mL — AB (ref <0.03)

## 2017-02-16 LAB — PREPARE RBC (CROSSMATCH)

## 2017-02-16 LAB — PHOSPHORUS: PHOSPHORUS: 2.7 mg/dL (ref 2.5–4.6)

## 2017-02-16 LAB — LACTIC ACID, PLASMA: LACTIC ACID, VENOUS: 2.6 mmol/L — AB (ref 0.5–1.9)

## 2017-02-16 LAB — MAGNESIUM: Magnesium: 1.5 mg/dL — ABNORMAL LOW (ref 1.7–2.4)

## 2017-02-16 LAB — HIV ANTIBODY (ROUTINE TESTING W REFLEX): HIV SCREEN 4TH GENERATION: NONREACTIVE

## 2017-02-16 LAB — MRSA PCR SCREENING: MRSA by PCR: NEGATIVE

## 2017-02-16 LAB — ABO/RH: ABO/RH(D): O POS

## 2017-02-16 SURGERY — INCISION AND DRAINAGE, ABSCESS
Anesthesia: General | Site: Buttocks | Laterality: Left

## 2017-02-16 MED ORDER — LIDOCAINE 2% (20 MG/ML) 5 ML SYRINGE
INTRAMUSCULAR | Status: AC
  Filled 2017-02-16: qty 15

## 2017-02-16 MED ORDER — CLINDAMYCIN PHOSPHATE 600 MG/50ML IV SOLN
600.0000 mg | Freq: Three times a day (TID) | INTRAVENOUS | Status: DC
  Administered 2017-02-16 (×2): 600 mg via INTRAVENOUS
  Filled 2017-02-16 (×3): qty 50

## 2017-02-16 MED ORDER — PROPOFOL 10 MG/ML IV BOLUS
INTRAVENOUS | Status: DC | PRN
  Administered 2017-02-16: 150 mg via INTRAVENOUS
  Administered 2017-02-16: 11:00:00 via INTRAVENOUS

## 2017-02-16 MED ORDER — SODIUM CHLORIDE 0.9 % IV SOLN
80.0000 mg | Freq: Every day | INTRAVENOUS | Status: DC
  Administered 2017-02-16 – 2017-02-21 (×6): 80 mg via INTRAVENOUS
  Filled 2017-02-16 (×10): qty 80

## 2017-02-16 MED ORDER — HEPARIN SODIUM (PORCINE) 5000 UNIT/ML IJ SOLN: 5000.0000 [IU] | Freq: Three times a day (TID) | INTRAMUSCULAR | Status: DC

## 2017-02-16 MED ORDER — PHENYLEPHRINE 40 MCG/ML (10ML) SYRINGE FOR IV PUSH (FOR BLOOD PRESSURE SUPPORT)
PREFILLED_SYRINGE | INTRAVENOUS | Status: AC
  Filled 2017-02-16: qty 50

## 2017-02-16 MED ORDER — METOCLOPRAMIDE HCL 5 MG/ML IJ SOLN
INTRAMUSCULAR | Status: AC
  Filled 2017-02-16: qty 2

## 2017-02-16 MED ORDER — CHLORHEXIDINE GLUCONATE CLOTH 2 % EX PADS
6.0000 | MEDICATED_PAD | Freq: Every day | CUTANEOUS | Status: DC
  Administered 2017-02-16 – 2017-02-20 (×2): 6 via TOPICAL

## 2017-02-16 MED ORDER — NOREPINEPHRINE BITARTRATE 1 MG/ML IV SOLN
0.0000 ug/min | INTRAVENOUS | Status: DC
  Administered 2017-02-16 – 2017-02-17 (×2): 25 ug/min via INTRAVENOUS
  Administered 2017-02-18: 10 ug/min via INTRAVENOUS
  Filled 2017-02-16 (×4): qty 16

## 2017-02-16 MED ORDER — ROCURONIUM BROMIDE 50 MG/5ML IV SOSY
PREFILLED_SYRINGE | INTRAVENOUS | Status: AC
  Filled 2017-02-16: qty 20

## 2017-02-16 MED ORDER — PROPOFOL 500 MG/50ML IV EMUL
INTRAVENOUS | Status: DC | PRN
  Administered 2017-02-16: 50 ug/kg/min via INTRAVENOUS

## 2017-02-16 MED ORDER — SODIUM CHLORIDE 0.9 % IV SOLN
250.0000 mL | INTRAVENOUS | Status: DC | PRN
  Administered 2017-02-16: 07:00:00 via INTRAVENOUS

## 2017-02-16 MED ORDER — NOREPINEPHRINE BITARTRATE 1 MG/ML IV SOLN
0.0000 ug/min | INTRAVENOUS | Status: AC
  Administered 2017-02-16: 4 ug/min via INTRAVENOUS
  Filled 2017-02-16 (×2): qty 4

## 2017-02-16 MED ORDER — PHENYLEPHRINE 40 MCG/ML (10ML) SYRINGE FOR IV PUSH (FOR BLOOD PRESSURE SUPPORT)
PREFILLED_SYRINGE | INTRAVENOUS | Status: DC | PRN
  Administered 2017-02-16: 400 ug via INTRAVENOUS

## 2017-02-16 MED ORDER — ONDANSETRON HCL 4 MG/2ML IJ SOLN
INTRAMUSCULAR | Status: AC
  Filled 2017-02-16: qty 4

## 2017-02-16 MED ORDER — EPHEDRINE 5 MG/ML INJ
INTRAVENOUS | Status: AC
  Filled 2017-02-16: qty 30

## 2017-02-16 MED ORDER — PROPOFOL 10 MG/ML IV BOLUS
INTRAVENOUS | Status: AC
  Filled 2017-02-16: qty 40

## 2017-02-16 MED ORDER — 0.9 % SODIUM CHLORIDE (POUR BTL) OPTIME
TOPICAL | Status: DC | PRN
  Administered 2017-02-16: 1000 mL

## 2017-02-16 MED ORDER — SODIUM CHLORIDE 0.9 % IV SOLN
INTRAVENOUS | Status: DC
  Administered 2017-02-16 (×3): via INTRAVENOUS

## 2017-02-16 MED ORDER — MIDAZOLAM HCL 2 MG/2ML IJ SOLN
INTRAMUSCULAR | Status: DC | PRN
  Administered 2017-02-16 (×2): 1 mg via INTRAVENOUS

## 2017-02-16 MED ORDER — MAGNESIUM SULFATE 2 GM/50ML IV SOLN
2.0000 g | Freq: Once | INTRAVENOUS | Status: AC
  Administered 2017-02-16: 2 g via INTRAVENOUS
  Filled 2017-02-16: qty 50

## 2017-02-16 MED ORDER — CHLORHEXIDINE GLUCONATE 0.12% ORAL RINSE (MEDLINE KIT)
15.0000 mL | Freq: Two times a day (BID) | OROMUCOSAL | Status: DC
Start: 2017-02-16 — End: 2017-03-04
  Administered 2017-02-16 – 2017-03-02 (×22): 15 mL via OROMUCOSAL
  Filled 2017-02-16: qty 15

## 2017-02-16 MED ORDER — VASOPRESSIN 20 UNIT/ML IV SOLN
0.0300 [IU]/min | INTRAVENOUS | Status: DC
  Administered 2017-02-16: 0.03 [IU]/min via INTRAVENOUS

## 2017-02-16 MED ORDER — HYDROMORPHONE HCL 1 MG/ML IJ SOLN
1.0000 mg | INTRAMUSCULAR | Status: DC | PRN
  Administered 2017-02-16: 0.5 mg via INTRAVENOUS
  Filled 2017-02-16: qty 1

## 2017-02-16 MED ORDER — FENTANYL CITRATE (PF) 100 MCG/2ML IJ SOLN
INTRAMUSCULAR | Status: DC | PRN
  Administered 2017-02-16: 50 ug via INTRAVENOUS
  Administered 2017-02-16: 100 ug via INTRAVENOUS
  Administered 2017-02-16 (×2): 50 ug via INTRAVENOUS

## 2017-02-16 MED ORDER — PIPERACILLIN-TAZOBACTAM 3.375 G IVPB
3.3750 g | Freq: Three times a day (TID) | INTRAVENOUS | Status: DC
  Administered 2017-02-16 – 2017-02-22 (×20): 3.375 g via INTRAVENOUS
  Filled 2017-02-16 (×21): qty 50

## 2017-02-16 MED ORDER — SODIUM CHLORIDE 0.9% FLUSH
10.0000 mL | INTRAVENOUS | Status: DC | PRN
  Administered 2017-02-23 – 2017-03-01 (×8): 10 mL
  Administered 2017-03-02: 20 mL
  Filled 2017-02-16 (×9): qty 40

## 2017-02-16 MED ORDER — CLINDAMYCIN PHOSPHATE 900 MG/50ML IV SOLN
900.0000 mg | Freq: Three times a day (TID) | INTRAVENOUS | Status: DC
  Administered 2017-02-16 – 2017-02-20 (×10): 900 mg via INTRAVENOUS
  Filled 2017-02-16 (×12): qty 50

## 2017-02-16 MED ORDER — PROPOFOL 1000 MG/100ML IV EMUL
5.0000 ug/kg/min | INTRAVENOUS | Status: DC
  Administered 2017-02-16: 30 ug/kg/min via INTRAVENOUS
  Administered 2017-02-16: 10 ug/kg/min via INTRAVENOUS
  Administered 2017-02-17: 80 ug/kg/min via INTRAVENOUS
  Administered 2017-02-17: 50 ug/kg/min via INTRAVENOUS
  Administered 2017-02-17 (×2): 40 ug/kg/min via INTRAVENOUS
  Administered 2017-02-17 (×3): 50 ug/kg/min via INTRAVENOUS
  Administered 2017-02-17 (×3): 80 ug/kg/min via INTRAVENOUS
  Administered 2017-02-17: 30 ug/kg/min via INTRAVENOUS
  Administered 2017-02-17: 50 ug/kg/min via INTRAVENOUS
  Administered 2017-02-18 (×2): 20 ug/kg/min via INTRAVENOUS
  Administered 2017-02-18 (×3): 40 ug/kg/min via INTRAVENOUS
  Administered 2017-02-18 (×2): 20 ug/kg/min via INTRAVENOUS
  Administered 2017-02-18: 30 ug/kg/min via INTRAVENOUS
  Administered 2017-02-19 (×2): 15 ug/kg/min via INTRAVENOUS
  Administered 2017-02-19: 13 ug/kg/min via INTRAVENOUS
  Administered 2017-02-19 (×2): 15 ug/kg/min via INTRAVENOUS
  Administered 2017-02-20: 10 ug/kg/min via INTRAVENOUS
  Administered 2017-02-21: 15 ug/kg/min via INTRAVENOUS
  Filled 2017-02-16 (×5): qty 100
  Filled 2017-02-16: qty 200
  Filled 2017-02-16 (×24): qty 100

## 2017-02-16 MED ORDER — LACTATED RINGERS IV SOLN
INTRAVENOUS | Status: DC | PRN
  Administered 2017-02-16 (×2): via INTRAVENOUS

## 2017-02-16 MED ORDER — VANCOMYCIN HCL 10 G IV SOLR
2000.0000 mg | INTRAVENOUS | Status: DC
  Administered 2017-02-16 – 2017-02-19 (×4): 2000 mg via INTRAVENOUS
  Filled 2017-02-16 (×4): qty 2000

## 2017-02-16 MED ORDER — ORAL CARE MOUTH RINSE: 15.0000 mL | Freq: Two times a day (BID) | OROMUCOSAL | Status: DC

## 2017-02-16 MED ORDER — SUCCINYLCHOLINE CHLORIDE 200 MG/10ML IV SOSY
PREFILLED_SYRINGE | INTRAVENOUS | Status: AC
  Filled 2017-02-16: qty 20

## 2017-02-16 MED ORDER — ALBUMIN HUMAN 5 % IV SOLN
INTRAVENOUS | Status: DC | PRN
  Administered 2017-02-16 (×2): via INTRAVENOUS

## 2017-02-16 MED ORDER — SODIUM CHLORIDE 0.9 % IV SOLN
0.0300 [IU]/min | INTRAVENOUS | Status: AC
  Administered 2017-02-16: 0.03 [IU]/min via INTRAVENOUS
  Filled 2017-02-16: qty 2

## 2017-02-16 MED ORDER — CALCIUM CHLORIDE 10 % IV SOLN
INTRAVENOUS | Status: DC | PRN
  Administered 2017-02-16: 800 mg via INTRAVENOUS
  Administered 2017-02-16: 600 mg via INTRAVENOUS
  Administered 2017-02-16 (×3): 200 mg via INTRAVENOUS

## 2017-02-16 MED ORDER — ORAL CARE MOUTH RINSE
15.0000 mL | Freq: Four times a day (QID) | OROMUCOSAL | Status: DC
  Administered 2017-02-16 – 2017-03-03 (×43): 15 mL via OROMUCOSAL

## 2017-02-16 MED ORDER — ROCURONIUM BROMIDE 10 MG/ML (PF) SYRINGE
PREFILLED_SYRINGE | INTRAVENOUS | Status: DC | PRN
  Administered 2017-02-16 (×3): 50 mg via INTRAVENOUS

## 2017-02-16 MED ORDER — CHLORHEXIDINE GLUCONATE CLOTH 2 % EX PADS: 6.0000 | MEDICATED_PAD | Freq: Every day | CUTANEOUS | Status: DC

## 2017-02-16 MED ORDER — SODIUM CHLORIDE 0.9 % IV SOLN
0.0000 ug/min | INTRAVENOUS | Status: DC
  Administered 2017-02-16: 08:00:00 via INTRAVENOUS
  Administered 2017-02-16: 200 ug/min via INTRAVENOUS
  Administered 2017-02-16: 50 ug/min via INTRAVENOUS
  Administered 2017-02-16: 07:00:00 via INTRAVENOUS
  Filled 2017-02-16 (×5): qty 1

## 2017-02-16 MED ORDER — VASOPRESSIN 20 UNIT/ML IV SOLN
0.0300 [IU]/min | INTRAVENOUS | Status: AC
  Administered 2017-02-16: 0.03 [IU]/min via INTRAVENOUS

## 2017-02-16 MED ORDER — SODIUM CHLORIDE 0.9% FLUSH
10.0000 mL | Freq: Two times a day (BID) | INTRAVENOUS | Status: DC
  Administered 2017-02-17 – 2017-02-20 (×2): 10 mL
  Administered 2017-02-20: 20 mL
  Administered 2017-02-21 – 2017-02-24 (×6): 10 mL

## 2017-02-16 MED ORDER — ALBUTEROL SULFATE (2.5 MG/3ML) 0.083% IN NEBU
2.5000 mg | INHALATION_SOLUTION | RESPIRATORY_TRACT | Status: DC | PRN
  Administered 2017-02-18 – 2017-02-20 (×5): 2.5 mg via RESPIRATORY_TRACT
  Filled 2017-02-16 (×5): qty 3

## 2017-02-16 MED ORDER — SODIUM CHLORIDE 0.9 % IV SOLN: Freq: Once | INTRAVENOUS | Status: DC

## 2017-02-16 MED ORDER — SUCCINYLCHOLINE CHLORIDE 200 MG/10ML IV SOSY
PREFILLED_SYRINGE | INTRAVENOUS | Status: DC | PRN
  Administered 2017-02-16: 140 mg via INTRAVENOUS

## 2017-02-16 MED ORDER — ENOXAPARIN SODIUM 100 MG/ML ~~LOC~~ SOLN
0.5000 mg/kg | SUBCUTANEOUS | Status: DC
  Administered 2017-02-17: 100 mg via SUBCUTANEOUS
  Filled 2017-02-16 (×3): qty 1

## 2017-02-16 MED ORDER — FENTANYL CITRATE (PF) 250 MCG/5ML IJ SOLN
INTRAMUSCULAR | Status: AC
  Filled 2017-02-16: qty 5

## 2017-02-16 MED ORDER — MIDAZOLAM HCL 2 MG/2ML IJ SOLN
INTRAMUSCULAR | Status: AC
  Filled 2017-02-16: qty 2

## 2017-02-16 SURGICAL SUPPLY — 31 items
BLADE CLIPPER SURG (BLADE) IMPLANT
BNDG GAUZE ELAST 4 BULKY (GAUZE/BANDAGES/DRESSINGS) ×9 IMPLANT
CANISTER SUCT 3000ML PPV (MISCELLANEOUS) ×3 IMPLANT
CHLORAPREP W/TINT 26ML (MISCELLANEOUS) IMPLANT
COVER SURGICAL LIGHT HANDLE (MISCELLANEOUS) ×3 IMPLANT
DRAPE LAPAROSCOPIC ABDOMINAL (DRAPES) ×3 IMPLANT
DRAPE UTILITY XL STRL (DRAPES) ×6 IMPLANT
ELECT CAUTERY BLADE 6.4 (BLADE) ×3 IMPLANT
ELECT REM PT RETURN 9FT ADLT (ELECTROSURGICAL) ×3
ELECTRODE REM PT RTRN 9FT ADLT (ELECTROSURGICAL) ×1 IMPLANT
GAUZE SPONGE 4X4 12PLY STRL (GAUZE/BANDAGES/DRESSINGS) ×3 IMPLANT
GLOVE BIOGEL PI IND STRL 7.0 (GLOVE) ×1 IMPLANT
GLOVE BIOGEL PI INDICATOR 7.0 (GLOVE) ×2
GLOVE EUDERMIC 7 POWDERFREE (GLOVE) ×6 IMPLANT
GOWN STRL REUS W/ TWL LRG LVL3 (GOWN DISPOSABLE) ×4 IMPLANT
GOWN STRL REUS W/ TWL XL LVL3 (GOWN DISPOSABLE) ×3 IMPLANT
GOWN STRL REUS W/TWL LRG LVL3 (GOWN DISPOSABLE) ×8
GOWN STRL REUS W/TWL XL LVL3 (GOWN DISPOSABLE) ×6
KIT BASIN OR (CUSTOM PROCEDURE TRAY) ×3 IMPLANT
KIT ROOM TURNOVER OR (KITS) ×3 IMPLANT
NS IRRIG 1000ML POUR BTL (IV SOLUTION) ×3 IMPLANT
PACK GENERAL/GYN (CUSTOM PROCEDURE TRAY) ×3 IMPLANT
PAD ABD 8X10 STRL (GAUZE/BANDAGES/DRESSINGS) ×9 IMPLANT
PAD ARMBOARD 7.5X6 YLW CONV (MISCELLANEOUS) ×6 IMPLANT
STAPLER VISISTAT 35W (STAPLE) ×3 IMPLANT
SWAB COLLECTION DEVICE MRSA (MISCELLANEOUS) ×3 IMPLANT
TAPE CLOTH SURG 6X10 WHT LF (GAUZE/BANDAGES/DRESSINGS) ×6 IMPLANT
TOWEL OR 17X24 6PK STRL BLUE (TOWEL DISPOSABLE) ×3 IMPLANT
TOWEL OR 17X26 10 PK STRL BLUE (TOWEL DISPOSABLE) ×3 IMPLANT
TUBE ANAEROBIC SPECIMEN COL (MISCELLANEOUS) ×3 IMPLANT
WATER STERILE IRR 1000ML POUR (IV SOLUTION) ×3 IMPLANT

## 2017-02-16 NOTE — Anesthesia Postprocedure Evaluation (Signed)
Anesthesia Post Note  Patient: Megan Ortiz  Procedure(s) Performed: Procedure(s) (LRB): DEBRIDEMENT LEFT BUTTOCK ABSCESS (Left)  Patient location during evaluation: ICU Anesthesia Type: General Level of consciousness: patient remains intubated per anesthesia plan and obtunded/minimal responses Pain management: pain level controlled Vital Signs Assessment: post-procedure vital signs reviewed and stable Respiratory status: patient remains intubated per anesthesia plan Cardiovascular status: unstable Postop Assessment: no signs of nausea or vomiting Anesthetic complications: no       Last Vitals:  Vitals:   02/16/17 0830 02/16/17 0831  BP:    Pulse: (!) 101 (!) 103  Resp: (!) 25 (!) 25  Temp:      Last Pain:  Vitals:   02/16/17 0540  TempSrc:   PainSc: 5                  Jeromy Borcherding A.

## 2017-02-16 NOTE — Progress Notes (Signed)
RT attempted x2 to insert aline without any success.

## 2017-02-16 NOTE — H&P (Signed)
PULMONARY / CRITICAL CARE MEDICINE   Name: Megan Ortiz MRN: [id number] DOB: December 26, 1975    ADMISSION DATE:  02/15/2017 CONSULTATION DATE:  02/16/2017  REFERRING MD:  Dr. Oleta Mouse, EDP  CHIEF COMPLAINT:  Sepsis   HISTORY OF PRESENT ILLNESS:   41 year old female with PMH of anemia, anxiety/depression, asthma, gastric ulcer, HTN, PE, and morbid obesity. Presents to [facility] ED on 4/5 with hypotension, tachycardia, and temp of 101.5. States that she has had a few days of diarrhea and decreased oral intake. Upon arrival to ED it was revealed that patient had an enlarging necrotizing wound of the buttocks (Patient states she first noted an "blister" about two days ago) with Lactic acid 4 and WBC 39. During Stay in ED patient received 6L NS bolus and started of Levophed. Patient was transferred to [facility] for surgical debridement of wound with Dr.Thompson. PCCM to admit.   PAST MEDICAL HISTORY :  She  has a past medical history of Anemia; Anxiety; Asthma; Gastric ulcer; Headache; Hiatal hernia; Hypertension; PE (pulmonary embolism); and Tachycardia.  PAST SURGICAL HISTORY: She  has a past surgical history that includes Cholecystectomy and Cesarean section.  Allergies  Allergen Reactions  . Aspirin     Due to hx of stomach ulcers    No current facility-administered medications on file prior to encounter.    Current Outpatient Prescriptions on File Prior to Encounter  Medication Sig  . albuterol (VENTOLIN HFA) 108 (90 BASE) MCG/ACT inhaler Inhale 2 puffs into the lungs every 6 (six) hours as needed for wheezing.  . diltiazem (CARDIZEM CD) 180 MG 24 hr capsule Take 180 mg by mouth daily.  . montelukast (SINGULAIR) 10 MG tablet Take 10 mg by mouth daily.  . Multiple Vitamin (MULTIVITAMIN WITH MINERALS) TABS Take 1 tablet by mouth daily.  . nortriptyline (PAMELOR) 25 MG capsule Take 25 mg by mouth at bedtime.   . sertraline (ZOLOFT) 100 MG tablet Take 1 tablet (100 mg total) by mouth 2 (two)  times daily.  Marland Kitchen spironolactone (ALDACTONE) 50 MG tablet Take 50 mg by mouth daily.    FAMILY HISTORY:  Her indicated that the status of her mother is unknown.    SOCIAL HISTORY: She  reports that she has quit smoking. She has never used smokeless tobacco. She reports that she drinks alcohol. She reports that she does not use drugs.  REVIEW OF SYSTEMS:   All negative; except for those that are bolded, which indicate positives.  Constitutional: weight loss, weight gain, night sweats, fevers, chills, fatigue, weakness.  HEENT: headaches, sore throat, sneezing, nasal congestion, post nasal drip, difficulty swallowing, tooth/dental problems, visual complaints, visual changes, ear aches. Neuro: difficulty with speech, weakness, numbness, ataxia. CV:  chest pain, orthopnea, PND, swelling in lower extremities, dizziness, palpitations, syncope.  Resp: cough, hemoptysis, dyspnea, wheezing. GI: heartburn, indigestion, abdominal pain, nausea, vomiting, diarrhea, constipation, change in bowel habits, loss of appetite, hematemesis, melena, hematochezia.  GU: dysuria, change in color of urine, urgency or frequency, flank pain, hematuria. MSK: joint pain or swelling, decreased range of motion. Psych: change in mood or affect, depression, anxiety, suicidal ideations, homicidal ideations. Skin: rash, itching, bruising.   SUBJECTIVE:  Off pressors. Denies Dyspnea. States she has pain to buttocks.   VITAL SIGNS: BP (!) 77/43   Pulse (!) 129   Temp (!) 101.5 F (38.6 C) (Rectal)   Resp (!) 24   Ht 5\' 5"  (1.651 m)   Wt (!) 204.1 kg (450 lb)   LMP  02/01/2017 (Approximate)   SpO2 93%   BMI 74.88 kg/m   HEMODYNAMICS:    VENTILATOR SETTINGS:    INTAKE / OUTPUT: No intake/output data recorded.  PHYSICAL EXAMINATION: General:  Adult female, no distress  Neuro:  Lethargic, oriented, follows commands, moves all extremities  HEENT:  Normocephalic  Cardiovascular:  Tachy, no MRG, NI  S1/S2 Lungs:  Distant breath sounds, non-labored Abdomen:  Obese, non-tender, active bowel sounds  Musculoskeletal:  No acute  Skin:  Warm, dry, open wound to buttocks  LABS:  BMET  Recent Labs Lab 02/15/17 1955  NA 132*  K 3.5  CL 104  CO2 15*  BUN 31*  CREATININE 2.41*  GLUCOSE 109*    Electrolytes  Recent Labs Lab 02/15/17 1955  CALCIUM 9.0    CBC  Recent Labs Lab 02/15/17 1955  WBC 39.2*  HGB 9.8*  HCT 30.6*  PLT 343    Coag's No results for input(s): APTT, INR in the last 168 hours.  Sepsis Markers  Recent Labs Lab 02/15/17 2005 02/15/17 2033  LATICACIDVEN 4.69* 2.6*    ABG No results for input(s): PHART, PCO2ART, PO2ART in the last 168 hours.  Liver Enzymes  Recent Labs Lab 02/15/17 1955  AST 33  ALT 19  ALKPHOS 69  BILITOT 0.8  ALBUMIN 3.0*    Cardiac Enzymes No results for input(s): TROPONINI, PROBNP in the last 168 hours.  Glucose No results for input(s): GLUCAP in the last 168 hours.  Imaging Dg Chest Port 1 View  Result Date: 02/15/2017 CLINICAL DATA:  Possible sepsis. History of asthma, hypertension and pulmonary embolism. History tachycardia. EXAM: PORTABLE CHEST 1 VIEW COMPARISON:  None. FINDINGS: Study limited by the patient's positioning, AP technique and the patient's body habitus. Cardiac silhouette is mildly enlarged. There is opacity at the left lung base. Mild hazy right mid to lower lung zone opacity is felt be due to the significant overlying soft tissues. No convincing pulmonary edema. No obvious pneumothorax.  No mediastinal or hilar masses. IMPRESSION: 1. Significantly limited study. 2. Mild enlargement of the cardiopericardial silhouette. 3. Possible pneumonia versus atelectasis at the left lung base. Electronically Signed   By: Lajean Manes M.D.   On: 02/15/2017 21:03     STUDIES:  CXR 4/5 > Mild enlargement of the cardiopericardial silhouette, pna vs atelectasis at the left base   CULTURES: Blood 4/5  >  ANTIBIOTICS: Clindamycin 4/5 > Vancomycin 4/5 > Zosyn 4/5 >  SIGNIFICANT EVENTS: 4/5 > Presents to ED > sepsis secondary to necrotizing wound   LINES/TUBES: Aline Right Radial 4/6 >  DISCUSSION: 41 year old morbidly obese female presents to ED with septic shock secondary to necrotizing buttock wound. Transfered to [facility] for surgical debridement.   ASSESSMENT / PLAN:  PULMONARY A: H/O Asthma P:   Maintain Oxygen >92 Pulmonary Hygiene  Albuterol PRN  CARDIOVASCULAR A:  Hypotension secondary to septic shock  H/O HTN, PE P:  Cardiac Monitoring  Maintain MAP >65 Wean Neo to achieve MAP goal  Hold home Cardizem, Lisinopril, Spironolactone    RENAL A:   Lactic Acidosis - Clearing  4.69 > 2.6 > 1.37 Acute Kidney Injury  Hyponatremia   P:   Trend BMP Replace electrolytes as needed  NS @ 125 ml/hr   GASTROINTESTINAL A:   Diarrhea  P:   NPO for possible surgery  Send GI panel   HEMATOLOGIC A:   Anemia  P:  Trend CBC Transfuse for Hbg <7  INFECTIOUS A:  Septic Shock secondary to necrotizing wound of buttocks  P:   Trend WBC and Fever Curve  Trend Lactic acid and Procal  Follow culture data Surgery following  Vancomycin, Zosyn, and Clindamycin   ENDOCRINE A:   No issues    P:   Trend Glucose   NEUROLOGIC A:   Acute Pain  H/O Anxiety/Depression  P:   RASS goal: 0 Monitor  PRN Dilaudid   FAMILY  - Updates: husband updated at bedside   - Inter-disciplinary family meet or Palliative Care meeting due by:  4/13  CC Time: 48 minutes   Hayden Pedro, AG-ACNP [facility]  Pgr: [phone]  PCCM Pgr: [phone]

## 2017-02-16 NOTE — Progress Notes (Signed)
Pharmacy Antibiotic Consult Note  Megan Ortiz is a 41 y.o. female admitted on 02/15/2017 with sepsis w/ presumed necrotizing fasciitis.  Pharmacy has been consulted for Vancocin and Zosyn dosing.  Plan: Rec'd vanc 1g and Zosyn 3.375g IV in ED. Vancomycin 2000mg  IV every 24 hours.  Goal trough 15-20 mcg/mL. Zosyn 3.375g IV q8h (4 hour infusion).  Height: 5\' 5"  (165.1 cm) Weight: (!) 450 lb (204.1 kg) IBW/kg (Calculated) : 57  Temp (24hrs), Avg:99.4 F (37.4 C), Min:98.1 F (36.7 C), Max:101.5 F (38.6 C)   Recent Labs Lab 02/15/17 1955 02/15/17 2005 02/15/17 2033 02/16/17 0037  WBC 39.2*  --   --   --   CREATININE 2.41*  --   --   --   LATICACIDVEN  --  4.69* 2.6* 1.37    Estimated Creatinine Clearance: 56.2 mL/min (A) (by C-G formula based on SCr of 2.41 mg/dL (H)).    Allergies  Allergen Reactions  . Aspirin     Due to hx of stomach ulcers    Thank you for allowing pharmacy to be a part of this patient's care.  Wynona Neat, PharmD, BCPS  02/16/2017 3:56 AM

## 2017-02-16 NOTE — Progress Notes (Signed)
General Surgery Attending:  I have interviewed and examined this patient this morning I have reviewed her clinical course I discussed her care with Dr. Georganna Skeans  It appears that she has a complex soft tissue infection of the left gluteal area extending antero-medially This will require debridement in the prone position in the operating room this morning This is high risk due to her body habitus and sepsis The surgery is required to stabilize her condition  I discussed the indications, details, techniques, and numerous risk of surgery with the patient and her husband.  She is aware the risk of bleeding, infection, reoperative surgery over the next day or 2, cardiac pulmonary and thromboembolic problem's.  She understands these issues well.  All of her questions are answered.  She agrees with this plan.  Edsel Petrin. Dalbert Batman, M.D., [facility] General and Minimally invasive Surgery Breast and Colorectal Surgery Office:   [phone] Pager:   [phone]

## 2017-02-16 NOTE — Consult Note (Signed)
Reason for Consult:L buttock infection Referring Physician: Kara Mead  Megan Ortiz is an 41 y.o. female.  HPI: Sakiyah developed pain in her left buttock about 3 days ago. It gradually worsened and she developed blistering on the skin yesterday. She went to the [facility] emergency department for further evaluation. She was found to have a necrotizing soft tissue infection of her left buttock. She was evaluated by Dr. Rosana Hoes from surgery there and in discussions with anesthesia at that location they determined she needed a higher level of care. Subsequently, she became hypotensive and sepsis protocol was begun. She was accepted in transfer by the critical care medicine service at [place]. She has received IV fluid resuscitation and her initial elevated lactate has now cleared. She is awake and alert. She complains of localized pain in this area.  Past Medical History:  Diagnosis Date  . Anemia   . Anxiety   . Asthma   . Gastric ulcer   . Headache   . Hiatal hernia   . Hypertension   . PE (pulmonary embolism)   . Tachycardia     Past Surgical History:  Procedure Laterality Date  . CESAREAN SECTION    . CHOLECYSTECTOMY      Family History  Problem Relation Age of Onset  . Depression Mother   . Alcohol abuse Mother     Social History:  reports that she has quit smoking. She has never used smokeless tobacco. She reports that she drinks alcohol. She reports that she does not use drugs.  Allergies:  Allergies  Allergen Reactions  . Aspirin     Due to hx of stomach ulcers    Medications:  Scheduled: . clindamycin (CLEOCIN) IV  600 mg Intravenous Q8H  . heparin  5,000 Units Subcutaneous Q8H  . piperacillin-tazobactam (ZOSYN)  IV  3.375 g Intravenous Q8H  . vancomycin  2,000 mg Intravenous Q24H   Continuous: . sodium chloride 125 mL/hr at 02/16/17 0417  . phenylephrine (NEO-SYNEPHRINE) Adult infusion 200 mcg/min (02/16/17 0454)   ZOX:WRUEAV chloride, albuterol,  HYDROmorphone (DILAUDID) injection Anti-infectives    Start     Dose/Rate Route Frequency Ordered Stop   02/16/17 0600  clindamycin (CLEOCIN) IVPB 600 mg     600 mg 100 mL/hr over 30 Minutes Intravenous Every 8 hours 02/16/17 0352     02/16/17 0400  piperacillin-tazobactam (ZOSYN) IVPB 3.375 g     3.375 g 12.5 mL/hr over 240 Minutes Intravenous Every 8 hours 02/16/17 0400     02/16/17 0400  vancomycin (VANCOCIN) 2,000 mg in sodium chloride 0.9 % 500 mL IVPB     2,000 mg 250 mL/hr over 120 Minutes Intravenous Every 24 hours 02/16/17 0400     02/15/17 2145  clindamycin (CLEOCIN) IVPB 600 mg     600 mg 100 mL/hr over 30 Minutes Intravenous  Once 02/15/17 2140 02/15/17 2347   02/15/17 2000  piperacillin-tazobactam (ZOSYN) IVPB 3.375 g     3.375 g 100 mL/hr over 30 Minutes Intravenous  Once 02/15/17 1955 02/15/17 2131   02/15/17 2000  vancomycin (VANCOCIN) IVPB 1000 mg/200 mL premix     1,000 mg 200 mL/hr over 60 Minutes Intravenous  Once 02/15/17 1955 02/15/17 2348      Results for orders placed or performed during the hospital encounter of 02/15/17 (from the past 48 hour(s))  CBC with Differential     Status: Abnormal   Collection Time: 02/15/17  7:55 PM  Result Value Ref Range   WBC 39.2 (H)  4.0 - 10.5 K/uL   RBC 3.54 (L) 3.87 - 5.11 MIL/uL   Hemoglobin 9.8 (L) 12.0 - 15.0 g/dL   HCT 30.6 (L) 36.0 - 46.0 %   MCV 86.4 78.0 - 100.0 fL   MCH 27.7 26.0 - 34.0 pg   MCHC 32.0 30.0 - 36.0 g/dL   RDW 16.9 (H) 11.5 - 15.5 %   Platelets 343 150 - 400 K/uL    Comment: SPECIMEN CHECKED FOR CLOTS LARGE PLATELETS PRESENT GIANT PLATELETS SEEN PLATELET COUNT CONFIRMED BY SMEAR    Neutrophils Relative % 93 %   Neutro Abs 36.6 (H) 1.7 - 7.7 K/uL   Lymphocytes Relative 3 %   Lymphs Abs 1.0 0.7 - 4.0 K/uL   Monocytes Relative 4 %   Monocytes Absolute 1.7 (H) 0.1 - 1.0 K/uL   Eosinophils Relative 0 %   Eosinophils Absolute 0.0 0.0 - 0.7 K/uL   Basophils Relative 0 %   Basophils Absolute  0.0 0.0 - 0.1 K/uL   WBC Morphology WHITE COUNT CONFIRMED ON SMEAR     Comment: INCREASED BANDS (>20% BANDS)   RBC Morphology POLYCHROMASIA PRESENT   Comprehensive metabolic panel     Status: Abnormal   Collection Time: 02/15/17  7:55 PM  Result Value Ref Range   Sodium 132 (L) 135 - 145 mmol/L   Potassium 3.5 3.5 - 5.1 mmol/L   Chloride 104 101 - 111 mmol/L   CO2 15 (L) 22 - 32 mmol/L   Glucose, Bld 109 (H) 65 - 99 mg/dL   BUN 31 (H) 6 - 20 mg/dL   Creatinine, Ser 2.41 (H) 0.44 - 1.00 mg/dL   Calcium 9.0 8.9 - 10.3 mg/dL   Total Protein 7.3 6.5 - 8.1 g/dL   Albumin 3.0 (L) 3.5 - 5.0 g/dL   AST 33 15 - 41 U/L   ALT 19 14 - 54 U/L   Alkaline Phosphatase 69 38 - 126 U/L   Total Bilirubin 0.8 0.3 - 1.2 mg/dL   GFR calc non Af Amer 24 (L) >60 mL/min   GFR calc Af Amer 28 (L) >60 mL/min    Comment: (NOTE) The eGFR has been calculated using the CKD EPI equation. This calculation has not been validated in all clinical situations. eGFR's persistently <60 mL/min signify possible Chronic Kidney Disease.    Anion gap 13 5 - 15  I-Stat CG4 Lactic Acid, ED     Status: Abnormal   Collection Time: 02/15/17  8:05 PM  Result Value Ref Range   Lactic Acid, Venous 4.69 (HH) 0.5 - 1.9 mmol/L  Blood culture (routine x 2)     Status: None (Preliminary result)   Collection Time: 02/15/17  8:33 PM  Result Value Ref Range   Specimen Description LEFT ANTECUBITAL    Special Requests      BOTTLES DRAWN AEROBIC AND ANAEROBIC Blood Culture adequate volume   Culture PENDING    Report Status PENDING   Blood culture (routine x 2)     Status: None (Preliminary result)   Collection Time: 02/15/17  8:33 PM  Result Value Ref Range   Specimen Description BLOOD LEFT FOREARM    Special Requests      BOTTLES DRAWN AEROBIC AND ANAEROBIC Blood Culture adequate volume   Culture PENDING    Report Status PENDING   Lactic acid, plasma     Status: Abnormal   Collection Time: 02/15/17  8:33 PM  Result Value Ref  Range   Lactic Acid, Venous 2.6 (HH)  0.5 - 1.9 mmol/L    Comment: CRITICAL RESULT CALLED TO, READ BACK BY AND VERIFIED WITH: NORMAN,B AT 0015 ON 4.6.17 BY ISLEY,B   I-Stat Beta hCG blood, ED ([facility], [facility], [facility] only)     Status: Abnormal   Collection Time: 02/15/17  8:43 PM  Result Value Ref Range   I-stat hCG, quantitative 8.0 (H) <5 mIU/mL   Comment 3            Comment:   GEST. AGE      CONC.  (mIU/mL)   <=1 WEEK        5 - 50     2 WEEKS       50 - 500     3 WEEKS       100 - 10,000     4 WEEKS     1,000 - 30,000        FEMALE AND NON-PREGNANT FEMALE:     LESS THAN 5 mIU/mL   I-Stat CG4 Lactic Acid, ED     Status: None   Collection Time: 02/16/17 12:37 AM  Result Value Ref Range   Lactic Acid, Venous 1.37 0.5 - 1.9 mmol/L  Urinalysis, Routine w reflex microscopic     Status: Abnormal   Collection Time: 02/16/17  1:00 AM  Result Value Ref Range   Color, Urine AMBER (A) YELLOW    Comment: BIOCHEMICALS MAY BE AFFECTED BY COLOR   APPearance HAZY (A) CLEAR   Specific Gravity, Urine 1.026 1.005 - 1.030   pH 5.0 5.0 - 8.0   Glucose, UA NEGATIVE NEGATIVE mg/dL   Hgb urine dipstick NEGATIVE NEGATIVE   Bilirubin Urine NEGATIVE NEGATIVE   Ketones, ur NEGATIVE NEGATIVE mg/dL   Protein, ur NEGATIVE NEGATIVE mg/dL   Nitrite NEGATIVE NEGATIVE   Leukocytes, UA NEGATIVE NEGATIVE  Glucose, capillary     Status: None   Collection Time: 02/16/17  4:15 AM  Result Value Ref Range   Glucose-Capillary 80 65 - 99 mg/dL   Comment 1 Notify RN    Comment 2 Document in Chart     Dg Chest Port 1 View  Result Date: 02/15/2017 CLINICAL DATA:  Possible sepsis. History of asthma, hypertension and pulmonary embolism. History tachycardia. EXAM: PORTABLE CHEST 1 VIEW COMPARISON:  None. FINDINGS: Study limited by the patient's positioning, [facility] technique and the patient's body habitus. Cardiac silhouette is mildly enlarged. There is opacity at the left lung base. Mild hazy right mid to lower lung zone opacity is  felt be due to the significant overlying soft tissues. No convincing pulmonary edema. No obvious pneumothorax.  No mediastinal or hilar masses. IMPRESSION: 1. Significantly limited study. 2. Mild enlargement of the cardiopericardial silhouette. 3. Possible pneumonia versus atelectasis at the left lung base. Electronically Signed   By: Lajean Manes M.D.   On: 02/15/2017 21:03    Review of Systems  Constitutional: Positive for fever and malaise/fatigue.  HENT: Negative.   Eyes: Negative for blurred vision.  Respiratory: Positive for shortness of breath. Negative for cough.   Cardiovascular: Negative for chest pain.  Gastrointestinal: Negative for abdominal pain, nausea and vomiting.  Genitourinary: Negative.   Musculoskeletal:       See HPI  Skin:       See HPI  Neurological: Negative.   Endo/Heme/Allergies: Negative.   Psychiatric/Behavioral: The patient is nervous/anxious.    Blood pressure (!) 69/50, pulse (!) 108, temperature 98.5 F (36.9 C), resp. rate (!) 30, height '5\' 5"'$  (1.651 m), weight Marland Kitchen)  205 kg (452 lb), last menstrual period 02/01/2017, SpO2 91 %. Physical Exam  Constitutional: She is oriented to person, place, and time. She appears well-developed.  HENT:  Head: Normocephalic.  Right Ear: External ear normal.  Left Ear: External ear normal.  Nose: Nose normal.  Mouth/Throat: Oropharynx is clear and moist.  Eyes: EOM are normal. Pupils are equal, round, and reactive to light.  Neck: No tracheal deviation present.  Cardiovascular: Normal heart sounds.   HR 105  Respiratory: She has no wheezes. She has no rales.  RR 30, lungs clear  GI: Soft. She exhibits no distension. There is no tenderness. There is no rebound.  Musculoskeletal:       Legs: 9 cm area of superficial skin necrosis medial left buttock with tenderness and some cellulitis, additionally, more medial down her gluteal cleft is another small area of superficial skin necrosis with intervening cellulitis    Neurological: She is alert and oriented to person, place, and time. She exhibits normal muscle tone.  Skin:  See above  Psychiatric: She has a normal mood and affect.    Assessment/Plan: Necrotizing soft tissue infection of the left buttock - continue IV Zosyn, vancomycin, and clindamycin. This will require surgical debridement this morning. I discussed the procedure, risks, and benefits with her and her husband. I will review her case with Dr. Dalbert Batman and I have made arrangements with the operating room. Morbid obesity - BMI 75.2 Septic shock - agree with ongoing aggressive management by CCM. She is undergoing arterial line placement at this time along with further intravenous access.  Hagen Tidd E 02/16/2017, 5:20 AM

## 2017-02-16 NOTE — Transfer of Care (Signed)
Immediate Anesthesia Transfer of Care Note  Patient: Megan Ortiz  Procedure(s) Performed: Procedure(s): DEBRIDEMENT LEFT BUTTOCK ABSCESS (Left)  Patient Location: ICU   Anesthesia Type:General  Level of Consciousness: sedated and Patient remains intubated per anesthesia plan  Airway & Oxygen Therapy: Patient remains intubated per anesthesia plan and Patient placed on Ventilator (see vital sign flow sheet for setting)  Post-op Assessment: Report given to RN, Post -op Vital signs reviewed and stable and Discussed case with CCM who was at bedside on arrival.   Post vital signs: Reviewed and stable  Last Vitals:  Vitals:   02/16/17 0830 02/16/17 0831  BP:    Pulse: (!) 101 (!) 103  Resp: (!) 25 (!) 25  Temp:      Last Pain:  Vitals:   02/16/17 0540  TempSrc:   PainSc: 5          Complications: No apparent anesthesia complications

## 2017-02-16 NOTE — Procedures (Signed)
INVASIVE PROCEDURE REPORT  DATE OF PROCEDURE: 02/16/2017   ATTENDING SURGEON: Corene Cornea E. Rosana Hoes, MD   ANESTHESIA: None   PRE-PROCEDURAL DIAGNOSIS: Left buttock necrotizing soft tissue infection with septic shock requiring real-time intra-arterial BP monitoring catheter and anticipated repeat arterial blood draws (ICD-10's: M72.6, R65.21)  POST-PROCEDURAL DIAGNOSIS: Left buttock necrotizing soft tissue infection with septic shock requiring real-time intra-arterial BP monitoring catheter and anticipated repeat arterial blood draws (ICD-10's: M72.6, R65.21)  PROCEDURE(S): (cpt: 36561) 1.) Percutaneous access of Right radial artery under ultrasound guidance  2.) Insertion of 34F Right radial intra-arterial monitoring catheter  INTRAOPERATIVE FINDINGS: Patent well-visualized Right radial artery with well-secured intra-arterial monitoring catheter at completion of the procedure  INTRAOPERATIVE FLUIDS: 0 mL crystalloid  ESTIMATED BLOOD LOSS: Minimal (<20 mL)   SPECIMENS: None   IMPLANTS: 34F Right radial intra-arterial monitoring catheter  DRAINS: None   COMPLICATIONS: None apparent   CONDITION AT COMPLETION: Awake, unchanged from pre-procedure, though SBP reads 78 - 84 mmHg via Right radial a-line vs 63 mmHg via B/L upper extremity automated cuff measurement  DISPOSITION: Remaining in ED awaiting transfer to [facility]  INDICATION(S) FOR PROCEDURE:  Patient is a 41 y.o. supermorbidly obese female who presented to [facility] ED with Left buttock pain x 4 days, which she attributed to scrubbing too hard with an abrasive "sugar scrub". 3 days ago, she began experiencing subjective fever and shaking chills. 2 days ago, she first noticed a blister over the painful site, and today she began feeling significantly worse pain, fevers, nausea, and overall poor. The painful blistered site also today began to turn a dark gray/black color, and the affected area grew larger throughout today. Left buttock necrotizing  soft tissue infection is suspected, along with septic shock, and real-time intra-arterial BP monitoring is indicated, particularly considering SBP 63 mmHg via automated BP cuff. All risks, benefits, and alternatives to above elective procedure were discussed with the patient, who elected to proceed, and informed consent was accordingly obtained at that time.  DETAILS OF PROCEDURE:  Accessible Right radial artery access site (due to patient laying on her Left side and unable/unwilling to roll to other side even for exam) was prepped and draped in the usual sterile fashion, and following a brief timeout, limited duplex evaluation of the Right radial artery was performed. Percutaneous Right radial arterial access was obtained under ultrasound guidance using Seldinger technique, by which access needle was inserted under direct ultrasound visualization into the Right radial artery, through which guidewire was advanced, over which access needle was withdrawn. 34F angiocatheter was advance over the guidewire, pulsatile flow was confirmed, and angiocatheter was well-secured with adhesive dressing. Catheter was confirmed to withdraw blood and flush easily, after which arterial BP monitoring tubing was connected and arterial blood pressure was transduced successfully with excellent waveform via Right radial arterial line.  I was present for all aspects of the procedures, and there were no intraprocedural complications apparent.

## 2017-02-16 NOTE — Anesthesia Preprocedure Evaluation (Addendum)
Anesthesia Evaluation  Patient identified by MRN, date of birth, ID band Patient awake    Reviewed: Allergy & Precautions, NPO status , Patient's Chart, lab work & pertinent test results  Airway Mallampati: III       Dental  (+) Edentulous Upper, Edentulous Lower   Pulmonary asthma , former smoker,    Pulmonary exam normal breath sounds clear to auscultation       Cardiovascular hypertension, Normal cardiovascular exam Rhythm:Regular Rate:Normal     Neuro/Psych  Headaches, PSYCHIATRIC DISORDERS Anxiety  Neuromuscular disease    GI/Hepatic Neg liver ROS, hiatal hernia, PUD,   Endo/Other  Morbid obesity  Renal/GU Renal InsufficiencyRenal disease  negative genitourinary   Musculoskeletal Left Buttock abscess   Abdominal (+) + obese,   Peds  Hematology  (+) anemia , Septic Shock   Anesthesia Other Findings   Reproductive/Obstetrics                            Lab Results  Component Value Date   WBC 42.6 (H) 02/16/2017   HGB 8.5 (L) 02/16/2017   HCT 27.5 (L) 02/16/2017   MCV 87.0 02/16/2017   PLT 376 02/16/2017     Chemistry      Component Value Date/Time   NA 135 02/16/2017 0502   K 3.7 02/16/2017 0502   CL 105 02/16/2017 0502   CO2 17 (L) 02/16/2017 0502   BUN 32 (H) 02/16/2017 0502   CREATININE 2.42 (H) 02/16/2017 0502      Component Value Date/Time   CALCIUM 8.2 (L) 02/16/2017 0502   ALKPHOS 69 02/15/2017 1955   AST 33 02/15/2017 1955   ALT 19 02/15/2017 1955   BILITOT 0.8 02/15/2017 1955     EKG 05/12/2013: sinus bradycardia.  Anesthesia Physical Anesthesia Plan  ASA: IV  Anesthesia Plan: General   Post-op Pain Management:    Induction: Intravenous  Airway Management Planned: Oral ETT  Additional Equipment: Arterial line and CVP  Intra-op Plan:   Post-operative Plan: Possible Post-op intubation/ventilation  Informed Consent: I have reviewed the patients  History and Physical, chart, labs and discussed the procedure including the risks, benefits and alternatives for the proposed anesthesia with the patient or authorized representative who has indicated his/her understanding and acceptance.   Dental advisory given  Plan Discussed with: Anesthesiologist, CRNA and Surgeon  Anesthesia Plan Comments:        Anesthesia Quick Evaluation

## 2017-02-16 NOTE — Anesthesia Procedure Notes (Signed)
Central Venous Catheter Insertion Performed by: Duane Boston, anesthesiologist Start/End4/04/2017 7:18 AM, 02/16/2017 7:28 AM Patient location: Pre-op. Preanesthetic checklist: patient identified, IV checked, site marked, risks and benefits discussed, surgical consent, monitors and equipment checked, pre-op evaluation, timeout performed and anesthesia consent Position: Trendelenburg Lidocaine 1% used for infiltration and patient sedated Hand hygiene performed , maximum sterile barriers used  and Seldinger technique used Catheter size: 8 Fr Total catheter length 20. Central line was placed.Triple lumen Procedure performed using ultrasound guided technique. Ultrasound Notes:anatomy identified, needle tip was noted to be adjacent to the nerve/plexus identified, no ultrasound evidence of intravascular and/or intraneural injection and image(s) printed for medical record Attempts: 1 Following insertion, dressing applied, line sutured and Biopatch. Post procedure assessment: blood return through all ports, free fluid flow and no air  Patient tolerated the procedure well with no immediate complications.

## 2017-02-16 NOTE — Op Note (Signed)
Patient Name:           Megan Ortiz   Date of Surgery:        02/16/2017  Pre op Diagnosis:      Necrotizing soft tissue infection left gluteal area and left pararectal space   Post op Diagnosis:    Same  Procedure:                 Debridement of skin, subcutaneous tissue, and muscle left gluteal and pararectal space                                      Obtain cultures  Surgeon:                     Edsel Petrin. Dalbert Batman, M.D., FACS  Assistant:                      OR staff   Indication for Assistant: n/a  Operative Indications:   This is a 41 year old Caucasian female who weighs 450 pounds.  She has a 3 day history of pain and tenderness and swelling in her left buttock.  This has been getting worse.  She was seen in the [facility] emergency department last night.  She was felt to have a necrotizing soft tissue infection.  They felt that she should be transferred to a higher level of care.  She was transferred here and has become hypotensive and sepsis protocol was begun by the critical care medicine service.  Initial lactic acidosis has cleared at this time.  We were asked to see her and noted a complex necrotic soft tissue infection in the left gluteal area extending toward the anus.  The anal sphincters and rectum appeared intact.  She was brought to the operating room for further resuscitation and debridement  Operative Findings:       There appeared to be a complex, synergistic infection involving the scan, subtenons tissue, and medially some of the gluteal muscles.  Tissues were grayish black with foul odor.  Cultures were obtained.  The entire area was debrided back to healthy, well vascularized bleeding tissue.  The dissection came relatively close to the rectum but did not destroy any of the rectal sphincter mechanism.  The wound was 4 cm deep, 12 cm vertically, and 19 cm transversely.  Procedure in Detail:          1.  Progress note or procedure note with a detailed  description of the procedure.(see below and above)  2.  Tool used for debridement (curette, scapel, etc.)  knife, cutting cautery, coagulation cautery.  3.  Frequency of surgical debridement.   Initial debridement  4.  Measurement of total devitalized tissue (wound surface) before and after surgical debridement.    Prior to debridement, wound surface was 19 cm transversely by 8 cm vertically. Following debridement the wound was 4 cm deep by 12 cm vertical by 19 cm transverse   5.  Area and depth of devitalized tissue removed from wound.  12 cm vertically by 19 cm transversely by 4 cm deep  6.  Blood loss and description of tissue removed.  Estimated blood loss 150 cc  7.  Evidence of the progress of the wound's response to treatment.  A.  Current wound volume (current dimensions and depth).  Same as above  B.  Presence (and extent of)  of infection.  Grossly infected with.  Not an necrotic tissue and foul odor  C.  Presence (and extent of) of non viable tissue.  All nonviable tissue debrided  D.  Other material in the wound that is expected to inhibit healing.  N/A  8.  Was there any viable tissue removed (measurements): Minimal  Procedure note:    Following the induction of general endotracheal anesthesia a right internal jugular central venous catheter was placed by anesthesia.  Multiple attempts at arterial line were unsuccessful by anesthesia..     In the operating room the patient was rolled prone and a little bit jackknifed.  This took 10 people because of her large body habitus.  Extensive padding and positioning was performed to protect all pressure points.  The gluteal skin was taped apart.  The gluteal and perianal areas were prepped and draped in a sterile fashion.  Surgical timeout was performed.  She was up-to-date on her sepsis protocol antibiotics.     Using knife and cautery I debrided all of the tissue described above.  Cultures were obtained.  I took the dissection  aggressively down to healthy bleeding tissue in all areas.  I did not see any residual necrotic tissue.  The wound was irrigated.  It was hemostatic.  There was packed with saline Kerlix, ABDs pads and tape.  The patient tolerated the patient's procedure reasonably well was taken back to the intensive care unit sedated and ventilated.  EBL 1 50 mL.  Counts correct.  Complications none.     I informed the patient's husband of the procedure and the extent of debridement.  I informed him that she would need to be returned to the operating room within the next 24-48 hours.  I informed him that she may need a colostomy.  I informed him that this was a life-threatening infection.      Edsel Petrin. Dalbert Batman, M.D., FACS General and Minimally Invasive Surgery Breast and Colorectal Surgery  02/16/2017 9:52 AM

## 2017-02-16 NOTE — Anesthesia Procedure Notes (Addendum)
Procedure Name: Intubation Date/Time: 02/16/2017 8:45 AM Performed by: Mervyn Gay Pre-anesthesia Checklist: Emergency Drugs available, Patient identified, Suction available, Patient being monitored and Timeout performed Patient Re-evaluated:Patient Re-evaluated prior to inductionOxygen Delivery Method: Circle system utilized Preoxygenation: Pre-oxygenation with 100% oxygen Intubation Type: IV induction, Rapid sequence and Cricoid Pressure applied Laryngoscope Size: Mac and 4 Grade View: Grade I Tube type: Subglottic suction tube Tube size: 7.5 mm Number of attempts: 1 Airway Equipment and Method: Stylet Placement Confirmation: ETT inserted through vocal cords under direct vision,  positive ETCO2 and breath sounds checked- equal and bilateral Secured at: 20 cm Tube secured with: Tape Dental Injury: Teeth and Oropharynx as per pre-operative assessment

## 2017-02-16 NOTE — Progress Notes (Signed)
Wasted 0.5mg  of Dilaudid in sink. Witnessed by Ruben Gottron RN.

## 2017-02-17 ENCOUNTER — Inpatient Hospital Stay (HOSPITAL_COMMUNITY): Payer: Self-pay

## 2017-02-17 DIAGNOSIS — J9601 Acute respiratory failure with hypoxia: Secondary | ICD-10-CM

## 2017-02-17 DIAGNOSIS — M726 Necrotizing fasciitis: Secondary | ICD-10-CM

## 2017-02-17 DIAGNOSIS — N179 Acute kidney failure, unspecified: Secondary | ICD-10-CM

## 2017-02-17 LAB — BLOOD GAS, ARTERIAL
ACID-BASE DEFICIT: 11.5 mmol/L — AB (ref 0.0–2.0)
ACID-BASE DEFICIT: 11.8 mmol/L — AB (ref 0.0–2.0)
BICARBONATE: 16.2 mmol/L — AB (ref 20.0–28.0)
Bicarbonate: 16.5 mmol/L — ABNORMAL LOW (ref 20.0–28.0)
DRAWN BY: 25203
DRAWN BY: 44135
FIO2: 60
FIO2: 60
LHR: 25 {breaths}/min
MECHVT: 500 mL
MECHVT: 450 mL
O2 Saturation: 94.1 %
O2 Saturation: 94.8 %
PATIENT TEMPERATURE: 98.6
PEEP/CPAP: 5 cmH2O
PEEP: 5 cmH2O
PH ART: 7.104 — AB (ref 7.350–7.450)
PO2 ART: 85.2 mmHg (ref 83.0–108.0)
Patient temperature: 98.6
RATE: 18 resp/min
pCO2 arterial: 55 mmHg — ABNORMAL HIGH (ref 32.0–48.0)
pCO2 arterial: 54 mmHg — ABNORMAL HIGH (ref 32.0–48.0)
pH, Arterial: 7.104 — CL (ref 7.350–7.450)
pO2, Arterial: 79.9 mmHg — ABNORMAL LOW (ref 83.0–108.0)

## 2017-02-17 LAB — GLUCOSE, CAPILLARY
GLUCOSE-CAPILLARY: 109 mg/dL — AB (ref 65–99)
Glucose-Capillary: 106 mg/dL — ABNORMAL HIGH (ref 65–99)
Glucose-Capillary: 108 mg/dL — ABNORMAL HIGH (ref 65–99)
Glucose-Capillary: 101 mg/dL — ABNORMAL HIGH (ref 65–99)
Glucose-Capillary: 98 mg/dL (ref 65–99)

## 2017-02-17 LAB — BASIC METABOLIC PANEL
Anion gap: 11 (ref 5–15)
BUN: 34 mg/dL — AB (ref 6–20)
CO2: 18 mmol/L — ABNORMAL LOW (ref 22–32)
Calcium: 8.4 mg/dL — ABNORMAL LOW (ref 8.9–10.3)
Chloride: 108 mmol/L (ref 101–111)
Creatinine, Ser: 1.92 mg/dL — ABNORMAL HIGH (ref 0.44–1.00)
GFR, EST AFRICAN AMERICAN: 36 mL/min — AB (ref >60)
GFR, EST NON AFRICAN AMERICAN: 31 mL/min — AB (ref >60)
Glucose, Bld: 106 mg/dL — ABNORMAL HIGH (ref 65–99)
POTASSIUM: 4.3 mmol/L (ref 3.5–5.1)
SODIUM: 137 mmol/L (ref 135–145)

## 2017-02-17 LAB — POCT I-STAT 3, ART BLOOD GAS (G3+)
Acid-base deficit: 13 mmol/L — ABNORMAL HIGH (ref 0.0–2.0)
Acid-base deficit: 11 mmol/L — ABNORMAL HIGH (ref 0.0–2.0)
BICARBONATE: 17 mmol/L — AB (ref 20.0–28.0)
Bicarbonate: 14.7 mmol/L — ABNORMAL LOW (ref 20.0–28.0)
O2 SAT: 98 %
O2 Saturation: 91 %
PCO2 ART: 40.6 mmHg (ref 32.0–48.0)
PCO2 ART: 48 mmHg (ref 32.0–48.0)
PH ART: 7.166 — AB (ref 7.350–7.450)
PO2 ART: 76 mmHg — AB (ref 83.0–108.0)
Patient temperature: 98.6
Patient temperature: 98.4
TCO2: 16 mmol/L (ref 0–100)
TCO2: 18 mmol/L (ref 0–100)
pH, Arterial: 7.158 — CL (ref 7.350–7.450)
pO2, Arterial: 131 mmHg — ABNORMAL HIGH (ref 83.0–108.0)

## 2017-02-17 LAB — CBC
HEMATOCRIT: 27.4 % — AB (ref 36.0–46.0)
Hemoglobin: 8.2 g/dL — ABNORMAL LOW (ref 12.0–15.0)
MCH: 26.7 pg (ref 26.0–34.0)
MCHC: 29.9 g/dL — ABNORMAL LOW (ref 30.0–36.0)
MCV: 89.3 fL (ref 78.0–100.0)
PLATELETS: 386 10*3/uL (ref 150–400)
RBC: 3.07 MIL/uL — AB (ref 3.87–5.11)
RDW: 17.9 % — AB (ref 11.5–15.5)
WBC: 38.9 10*3/uL — AB (ref 4.0–10.5)

## 2017-02-17 LAB — MAGNESIUM: Magnesium: 2 mg/dL (ref 1.7–2.4)

## 2017-02-17 LAB — PROCALCITONIN: PROCALCITONIN: 5.69 ng/mL

## 2017-02-17 LAB — PHOSPHORUS: PHOSPHORUS: 4.8 mg/dL — AB (ref 2.5–4.6)

## 2017-02-17 MED ORDER — SODIUM CHLORIDE 0.9 % IV SOLN
0.0000 ug/kg/h | INTRAVENOUS | Status: DC
  Filled 2017-02-17: qty 2

## 2017-02-17 MED ORDER — SODIUM BICARBONATE 8.4 % IV SOLN
INTRAVENOUS | Status: DC
  Administered 2017-02-17 – 2017-02-20 (×3): via INTRAVENOUS
  Filled 2017-02-17 (×9): qty 100

## 2017-02-17 MED ORDER — IPRATROPIUM BROMIDE 0.02 % IN SOLN
0.5000 mg | Freq: Four times a day (QID) | RESPIRATORY_TRACT | Status: DC
  Administered 2017-02-17 – 2017-02-24 (×27): 0.5 mg via RESPIRATORY_TRACT
  Filled 2017-02-17 (×28): qty 2.5

## 2017-02-17 MED ORDER — SODIUM CHLORIDE 0.9 % IV SOLN: INTRAVENOUS | Status: DC | PRN

## 2017-02-17 MED ORDER — FENTANYL CITRATE (PF) 100 MCG/2ML IJ SOLN
50.0000 ug | INTRAMUSCULAR | Status: DC | PRN
  Administered 2017-02-17: 50 ug via INTRAVENOUS
  Filled 2017-02-17: qty 2

## 2017-02-17 MED ORDER — FENTANYL CITRATE (PF) 100 MCG/2ML IJ SOLN
100.0000 ug | INTRAMUSCULAR | Status: DC | PRN
  Administered 2017-02-18 – 2017-02-20 (×5): 100 ug via INTRAVENOUS
  Filled 2017-02-17: qty 2

## 2017-02-17 MED ORDER — FENTANYL CITRATE (PF) 100 MCG/2ML IJ SOLN
100.0000 ug | INTRAMUSCULAR | Status: DC | PRN
  Administered 2017-02-17 – 2017-02-18 (×2): 100 ug via INTRAVENOUS

## 2017-02-17 MED ORDER — SODIUM BICARBONATE 8.4 % IV SOLN
100.0000 meq | Freq: Once | INTRAVENOUS | Status: AC
  Administered 2017-02-18: 100 meq via INTRAVENOUS
  Filled 2017-02-17: qty 50

## 2017-02-17 MED ORDER — ACETYLCYSTEINE 20 % IN SOLN
4.0000 mL | Freq: Two times a day (BID) | RESPIRATORY_TRACT | Status: DC
  Filled 2017-02-17: qty 4

## 2017-02-17 MED ORDER — FENTANYL 2500MCG IN NS 250ML (10MCG/ML) PREMIX INFUSION
25.0000 ug/h | INTRAVENOUS | Status: DC
  Administered 2017-02-17: 300 ug/h via INTRAVENOUS
  Administered 2017-02-17: 100 ug/h via INTRAVENOUS
  Administered 2017-02-18: 175 ug/h via INTRAVENOUS
  Administered 2017-02-18: 300 ug/h via INTRAVENOUS
  Administered 2017-02-19: 150 ug/h via INTRAVENOUS
  Administered 2017-02-20: 200 ug/h via INTRAVENOUS
  Administered 2017-02-20: 150 ug/h via INTRAVENOUS
  Administered 2017-02-21: 200 ug/h via INTRAVENOUS
  Filled 2017-02-17 (×9): qty 250

## 2017-02-17 MED ORDER — FENTANYL CITRATE (PF) 100 MCG/2ML IJ SOLN: 50.0000 ug | Freq: Once | INTRAMUSCULAR | Status: AC

## 2017-02-17 MED ORDER — BUDESONIDE 0.5 MG/2ML IN SUSP
0.5000 mg | Freq: Two times a day (BID) | RESPIRATORY_TRACT | Status: DC
  Administered 2017-02-17 – 2017-03-09 (×37): 0.5 mg via RESPIRATORY_TRACT
  Filled 2017-02-17 (×40): qty 2

## 2017-02-17 MED ORDER — ENOXAPARIN SODIUM 100 MG/ML ~~LOC~~ SOLN
100.0000 mg | SUBCUTANEOUS | Status: DC
  Administered 2017-02-18 – 2017-03-09 (×19): 100 mg via SUBCUTANEOUS
  Filled 2017-02-17 (×20): qty 1

## 2017-02-17 NOTE — Progress Notes (Signed)
[facility] Progress Note Patient Name: Megan Ortiz DOB: 1975/12/12 MRN: [id number]   Date of Service  02/17/2017  HPI/Events of Note  Ongoing metabolic acidosis with pH 7.16/48/76/17.  On bicarb gtt.  eICU Interventions  Plan: Increase RR on vent to 35 2 amps of bicarb IVP Increase bicarb gtt rate to 125cc/hr Check lactate Recheck ABG in 2 hours Goal pH greater or equal to 7.2     Intervention Category Major Interventions: Acid-Base disturbance - evaluation and management  Joane Postel 02/17/2017, 11:57 PM

## 2017-02-17 NOTE — Progress Notes (Signed)
CRITICAL VALUE ALERT  Critical value received:  pH 7.104  Date of notification:  02/17/2017  Time of notification:  3567  Critical value read back: Yes  Nurse who received alert:  Lonn Georgia RN  MD notified (1st page): Deterding  Time of first page:    MD notified (2nd page):  Time of second page:  Responding MD: Deterding  Time MD responded:  [phone]

## 2017-02-17 NOTE — Progress Notes (Signed)
Arterial line attempted to left radial artery. Unable to thread catheter into artery. Pressure was held with no hematoma.

## 2017-02-17 NOTE — Progress Notes (Signed)
[name] Progress Note 1 Day Post-Op  Subjective: Patient was debrided yesterday.  On Levophed and Propofol.  BP okay currently   Markedly acidotic. BD > 11.  Creatinine 1.92.  On C.diff precautions.    Objective: Vital signs in last 24 hours: Temp:  [98.3 F (36.8 C)-99.1 F (37.3 C)] 98.9 F (37.2 C) (04/07 0345) Pulse Rate:  [98-134] 117 (04/07 0751) Resp:  [16-30] 23 (04/07 0751) BP: (66-139)/(32-84) 126/58 (04/07 0751) SpO2:  [91 %-100 %] 96 % (04/07 0751) FiO2 (%):  [60 %] 60 % (04/07 0751) Weight:  [197.3 kg (435 lb)-200.5 kg (442 lb)] 200.5 kg (442 lb) (04/07 0500) Last BM Date:  (PTA)  Intake/Output from previous day: 04/06 0701 - 04/07 0700 In: 8406.2 [I.V.:6856.2; IV Piggyback:1550] Out: 1850 [Urine:1825; Blood:25] Intake/Output this shift: No intake/output data recorded.  General: Sedated on pressors  Lungs: Clear.  FIO2 60%  Abd: Soft, benign.  No tube feedings.  Extremities/Wound: Large 20 cm diameter wound with most of the necrosis medially towards the anus     Neuro: Sedated.  Lab Results:  @LABLAST2 (wbc:2,hgb:2,hct:2,plt:2) BMET ) Recent Labs  02/16/17 0502 02/17/17 0339  NA 135 137  K 3.7 4.3  CL 105 108  CO2 17* 18*  GLUCOSE 95 106*  BUN 32* 34*  CREATININE 2.42* 1.92*  CALCIUM 8.2* 8.4*   PT/INR No results for input(s): LABPROT, INR in the last 72 hours. ABG  Recent Labs  02/17/17 0415 02/17/17 0645  PHART 7.104* 7.104*  HCO3 16.5* 16.2*    Studies/Results: Dg Chest Port 1 View  Result Date: 02/17/2017 CLINICAL DATA:  Intubation. EXAM: PORTABLE CHEST 1 VIEW COMPARISON:  February 16, 2017 FINDINGS: The ETT has been pulled back somewhat in the interval and terminates 2.8 cm below the thoracic inlet in adequate position. No pneumothorax. A right central line terminates in the central SVC. Increasing bilateral pulmonary opacities, right greater than left suggesting multifocal infiltrate/pneumonia. Cardiomegaly persists. IMPRESSION: 1.  The ET tube has been pulled back but still terminates nearly 3 cm below the thoracic inlet in adequate position. Other support apparatus is stable. 2. Increasing bilateral pulmonary opacities. The appearance is concerning for multifocal pneumonia. Recommend clinical correlation and attention on follow-up. Electronically Signed   By: Dorise Bullion III M.D   On: 02/17/2017 07:44   Dg Chest Port 1 View  Result Date: 02/16/2017 CLINICAL DATA:  Postoperative debridement of gluteal necrotic lesion. Hypoxia. EXAM: PORTABLE CHEST 1 VIEW COMPARISON:  February 15, 2017 FINDINGS: Endotracheal tube tip is 4.7 cm above the carina. Central catheter tip is in the superior vena cava. Nasogastric tube tip is at the gastroesophageal junction. The tube deviates toward the right in the lower chest, likely due to a hiatal hernia. There is no appreciable edema or consolidation. There is right midlung atelectatic change. There is cardiomegaly with pulmonary venous hypertension. No evident adenopathy. No bone lesions. IMPRESSION: Tube and catheter positions as described without pneumothorax. There is pulmonary vascular congestion. There is right midlung atelectasis. No frank edema or consolidation. Electronically Signed   By: Lowella Grip III M.D.   On: 02/16/2017 11:08   Dg Chest Port 1 View  Result Date: 02/15/2017 CLINICAL DATA:  Possible sepsis. History of asthma, hypertension and pulmonary embolism. History tachycardia. EXAM: PORTABLE CHEST 1 VIEW COMPARISON:  None. FINDINGS: Study limited by the patient's positioning, AP technique and the patient's body habitus. Cardiac silhouette is mildly enlarged. There is opacity at the left lung base. Mild hazy right mid to lower  lung zone opacity is felt be due to the significant overlying soft tissues. No convincing pulmonary edema. No obvious pneumothorax.  No mediastinal or hilar masses. IMPRESSION: 1. Significantly limited study. 2. Mild enlargement of the cardiopericardial  silhouette. 3. Possible pneumonia versus atelectasis at the left lung base. Electronically Signed   By: Lajean Manes M.D.   On: 02/15/2017 21:03   Dg Abd Portable 1v  Result Date: 02/16/2017 CLINICAL DATA:  NG tube placement EXAM: PORTABLE ABDOMEN - 1 VIEW COMPARISON:  None. FINDINGS: Nasogastric tube with the tip projecting over the body of the stomach. There is no bowel dilatation to suggest obstruction. There is no evidence of pneumoperitoneum, portal venous gas or pneumatosis. There are no pathologic calcifications along the expected course of the ureters. The osseous structures are unremarkable. IMPRESSION: Nasogastric tube with the tip projecting over the body of the stomach. Electronically Signed   By: Kathreen Devoid   On: 02/16/2017 11:54    Anti-infectives: Anti-infectives    Start     Dose/Rate Route Frequency Ordered Stop   02/16/17 2200  clindamycin (CLEOCIN) IVPB 900 mg     900 mg 100 mL/hr over 30 Minutes Intravenous Every 8 hours 02/16/17 1418     02/16/17 0600  clindamycin (CLEOCIN) IVPB 600 mg  Status:  Discontinued     600 mg 100 mL/hr over 30 Minutes Intravenous Every 8 hours 02/16/17 0352 02/16/17 1418   02/16/17 0400  piperacillin-tazobactam (ZOSYN) IVPB 3.375 g     3.375 g 12.5 mL/hr over 240 Minutes Intravenous Every 8 hours 02/16/17 0400     02/16/17 0400  vancomycin (VANCOCIN) 2,000 mg in sodium chloride 0.9 % 500 mL IVPB     2,000 mg 250 mL/hr over 120 Minutes Intravenous Every 24 hours 02/16/17 0400     02/15/17 2145  clindamycin (CLEOCIN) IVPB 600 mg     600 mg 100 mL/hr over 30 Minutes Intravenous  Once 02/15/17 2140 02/15/17 2347   02/15/17 2000  piperacillin-tazobactam (ZOSYN) IVPB 3.375 g     3.375 g 100 mL/hr over 30 Minutes Intravenous  Once 02/15/17 1955 02/15/17 2131   02/15/17 2000  vancomycin (VANCOCIN) IVPB 1000 mg/200 mL premix     1,000 mg 200 mL/hr over 60 Minutes Intravenous  Once 02/15/17 1955 02/15/17 2348      Assessment/Plan: s/p  Procedure(s): DEBRIDEMENT LEFT BUTTOCK ABSCESS Large necrostic wound of the left gluteal and perianal area without significant tunneling or continued pus.  Will possibly need debridement in the OR again in a few days but the patient is not readdy today, and probably does not require OR tomorrow.  Continue daily dressing changes, but it took three nurses and myself to do the dressing changes.  LOS: 1 day   Kathryne Eriksson. Dahlia Bailiff, MD, FACS [phone] [facility] Surgery 02/17/2017

## 2017-02-17 NOTE — Progress Notes (Signed)
[facility] Progress Note Patient Name: Megan Ortiz DOB: 1976-07-21 MRN: [id number]   Date of Service  02/17/2017  HPI/Events of Note  Mixed resp and metabolic acidosis on vent.  Patient also dyssynchronous with vent.    eICU Interventions  Plan: Increase RR and TV on vent Patient already on max propofol Adjusted prn fentanyl dosing        Eain Mullendore 02/17/2017, 4:57 AM

## 2017-02-17 NOTE — Progress Notes (Signed)
Initial Nutrition Assessment  DOCUMENTATION CODES:  Morbid obesity  INTERVENTION:  On MD authorization, initiate TF via OGT. Due to very high propofol rate and to minimize overfeeding, recommend VHP at goal rate of 35 ml/h (840 ml per day) and Prostat 60  ml TID to provide 1440 kcals, 134 gm protein, 702 ml free water daily.  Would recommend Vit D supplementation given super obesity  NUTRITION DIAGNOSIS:  Increased nutrient needs related to wound healing as evidenced by estimated nutritional requirements for this outcome  GOAL:  Patient will meet greater than or equal to 90% of their needs  MONITOR:  Diet advancement, Vent status, Labs, TF tolerance, Weight trends, Skin  REASON FOR ASSESSMENT:  Ventilator    ASSESSMENT:  41 year old female, PMHx anemia, anxiety/depression, gastric ulcer, HTN, PE and super/morbid obesity. Presented to ED with hypotension, tachycardia and fever. Had few days reported poor PO intake/diarrhea. Found to have septic shock 2/2 enlarging necrotizing wound to buttocks s/p surgical debridement 4/6.   Pt intubated, sedated on fentanyl and very high volume proprol . No family/historians present. RN reports patient likely to be taken back to OR tomorrow. LIkely will remain intubated.   As seen in MD note, pt has extremely large wound. Would benefit from enteral nutrition. Discussed with RN.   Patient is currently intubated on ventilator support MV: 13.4 L/min Temp (24hrs), Avg:98.6 F (37 C), Min:98 F (36.7 C), Max:99.1 F (37.3 C)  Propofol at time of assessment: 41.4 ml/hr =1093 kcals/day  Phsyical exam: Morbidly obese, unable to assess stomach due to amount of adipose tissue  Labs: Glu 90-130mg /dl, renal labs improving, WBC: 38.9, Phos 4.8 Medications: IV abx, PPI, Bicarb, fentanyl, prop, Pressor support: Levo, Neo, Vaso   Recent Labs Lab 02/15/17 1955 02/16/17 0502 02/17/17 0339  NA 132* 135 137  K 3.5 3.7 4.3  CL 104 105 108  CO2 15* 17*  18*  BUN 31* 32* 34*  CREATININE 2.41* 2.42* 1.92*  CALCIUM 9.0 8.2* 8.4*  MG  --  1.5* 2.0  PHOS  --  2.7 4.8*  GLUCOSE 109* 95 106*   Diet Order:  Diet NPO time specified  Skin:  Non pressure wound to left buttock  Last BM:  Unknown  Height:  Ht Readings from Last 1 Encounters:  02/16/17 5\' 5"  (1.651 m)   Weight:  Wt Readings from Last 1 Encounters:  02/17/17 (!) 442 lb (200.5 kg)   Wt Readings from Last 10 Encounters:  02/17/17 (!) 442 lb (200.5 kg)  05/11/13 (!) 450 lb (204.1 kg)  Dosing weight: 435 lbs: 197.73 kg  Ideal Body Weight:  56.82 kg  BMI:  Body mass index is 73.55 kg/m.  Estimated Nutritional Needs:  Kcal:  1250-1420 kcals (22-25 kcal/kg IBW) Protein:  125-140g (2.2-2.5 g/kg ibw) Fluid:  Per MD  EDUCATION NEEDS:  No education needs identified at this time  Burtis Junes RD, LDN, [facility] Pager: [phone] 02/17/2017 12:46 PM

## 2017-02-17 NOTE — ED Notes (Signed)
Lactic acid 4.69 reported to Dr Oleta Mouse.  No new orders

## 2017-02-17 NOTE — Progress Notes (Signed)
RN called regarding patient having abdominal breathing and vent dyssynchrony. Already on 93mcg/kg/min of profolol. Added fentanyl prn.

## 2017-02-17 NOTE — Progress Notes (Signed)
dc'd contact and enteric isolation ordered by Dr Hulen Skains. Patient negative for MRSA. No loose bowel movements.

## 2017-02-17 NOTE — Progress Notes (Signed)
CRITICAL VALUE ALERT  Critical value received:  PH 7.104  Date of notification:  02/17/2017  Time of notification: 0700  Critical value read back: Yes  Nurse who received alert:  Lonn Georgia RN  MD notified (1st page):  Corrie Dandy  Time of first page:    MD notified (2nd page):  Time of second page:  Responding MD:  Corrie Dandy  Time MD responded:  [phone]

## 2017-02-17 NOTE — Progress Notes (Signed)
RT attempted Aline per order, unsuccessfully 2 times. MD aware.

## 2017-02-17 NOTE — Progress Notes (Signed)
PULMONARY / CRITICAL CARE MEDICINE   Name: Megan Ortiz MRN: [id number] DOB: [birth year]    ADMISSION DATE:  02/15/2017 CONSULTATION DATE:  02/16/2017  REFERRING MD:  Dr. Oleta Mouse, EDP  CHIEF COMPLAINT:  Sepsis   HISTORY OF PRESENT ILLNESS:   41 year old female with PMH of anemia, anxiety/depression, asthma, gastric ulcer, HTN, PE, and morbid obesity. Presents to [facility] ED on 4/5 with hypotension, tachycardia, and temp of 101.5. States that she has had a few days of diarrhea and decreased oral intake. Upon arrival to ED it was revealed that patient had an enlarging necrotizing wound of the buttocks (Patient states she first noted an "blister" about two days ago) with Lactic acid 4 and WBC 39. During Stay in ED patient received 6L NS bolus and started of Levophed. Patient was transferred to [facility] for surgical debridement of wound with Dr.Thompson. PCCM to admit.    SUBJECTIVE:  Had surgical debridement.  Still on pressors but less Very uncomfortable on the vent. Air hungry.    VITAL SIGNS: BP (!) 103/37   Pulse (!) 106   Temp 98 F (36.7 C) (Oral)   Resp 18   Ht 5\' 5"  (1.651 m)   Wt (!) 200.5 kg (442 lb)   LMP 02/01/2017 (Approximate)   SpO2 97%   BMI 73.55 kg/m   HEMODYNAMICS:    VENTILATOR SETTINGS: Vent Mode: PRVC FiO2 (%):  [60 %] 60 % Set Rate:  [18 bmp-25 bmp] 20 bmp Vt Set:  [450 mL-600 mL] 600 mL PEEP:  [5 cmH20-8 cmH20] 8 cmH20 Plateau Pressure:  [13 cmH20-25 cmH20] 13 cmH20  INTAKE / OUTPUT: I/O last 3 completed shifts: In: 17784.3 [I.V.:7434.3; IV [id number]:78295] Out: 2120 [Urine:2095; Blood:25]  PHYSICAL EXAMINATION: General:  Adult female, was dysynchronous on the vent. Air hungry.   Neuro: CN grossly intact. Sedated, intubated.  HEENT:  Normocephalic  Cardiovascular:  Tachy, no MRG, NI S1/S2 Lungs:  Distant breath sounds, non-labored Abdomen:  Obese, non-tender, active bowel sounds  Musculoskeletal:  No acute  Skin:  Warm, dry, open wound to  buttocks  LABS:  BMET  Recent Labs Lab 02/15/17 1955 02/16/17 0502 02/17/17 0339  NA 132* 135 137  K 3.5 3.7 4.3  CL 104 105 108  CO2 15* 17* 18*  BUN 31* 32* 34*  CREATININE 2.41* 2.42* 1.92*  GLUCOSE 109* 95 106*    Electrolytes  Recent Labs Lab 02/15/17 1955 02/16/17 0502 02/17/17 0339  CALCIUM 9.0 8.2* 8.4*  MG  --  1.5* 2.0  PHOS  --  2.7 4.8*    CBC  Recent Labs Lab 02/16/17 0502 02/16/17 1315 02/17/17 0339  WBC 42.6* 53.8* 38.9*  HGB 8.5* 8.9* 8.2*  HCT 27.5* 28.8* 27.4*  PLT 376 425* 386    Coag's No results for input(s): APTT, INR in the last 168 hours.  Sepsis Markers  Recent Labs Lab 02/15/17 2005 02/15/17 2033 02/16/17 0037 02/16/17 0502 02/17/17 0339  LATICACIDVEN 4.69* 2.6* 1.37  --   --   PROCALCITON  --   --   --  6.82 5.69    ABG  Recent Labs Lab 02/17/17 0415 02/17/17 0645 02/17/17 1128  PHART 7.104* 7.104* 7.166*  PCO2ART 55.0* 54.0* 40.6  PO2ART 79.9* 85.2 131.0*    Liver Enzymes  Recent Labs Lab 02/15/17 1955  AST 33  ALT 19  ALKPHOS 69  BILITOT 0.8  ALBUMIN 3.0*    Cardiac Enzymes  Recent Labs Lab 02/16/17 0502  TROPONINI 0.11*  Glucose  Recent Labs Lab 02/16/17 1608 02/16/17 2017 02/16/17 2357 02/17/17 0348 02/17/17 0859 02/17/17 1223  GLUCAP 163* 122* 111* 106* 109* 108*    Imaging Dg Chest Port 1 View  Result Date: 02/17/2017 CLINICAL DATA:  Intubation. EXAM: PORTABLE CHEST 1 VIEW COMPARISON:  February 16, 2017 FINDINGS: The ETT has been pulled back somewhat in the interval and terminates 2.8 cm below the thoracic inlet in adequate position. No pneumothorax. A right central line terminates in the central SVC. Increasing bilateral pulmonary opacities, right greater than left suggesting multifocal infiltrate/pneumonia. Cardiomegaly persists. IMPRESSION: 1. The ET tube has been pulled back but still terminates nearly 3 cm below the thoracic inlet in adequate position. Other support  apparatus is stable. 2. Increasing bilateral pulmonary opacities. The appearance is concerning for multifocal pneumonia. Recommend clinical correlation and attention on follow-up. Electronically Signed   By: Dorise Bullion III M.D   On: 02/17/2017 07:44     STUDIES:  CXR 4/5 > Mild enlargement of the cardiopericardial silhouette, pna vs atelectasis at the left base   CULTURES: Blood 4/5 > Wound 4/5 > strep viridans  ANTIBIOTICS: Clindamycin 4/5 > Vancomycin 4/5 > Zosyn 4/5 >  SIGNIFICANT EVENTS: 4/5 > Presents to ED > sepsis secondary to necrotizing wound   LINES/TUBES: Aline Right Radial 4/6 >  DISCUSSION: 41 year old morbidly obese female presents to ED with septic shock secondary to necrotizing buttock wound. Transfered to [facility] for surgical debridement.   ASSESSMENT / PLAN:  PULMONARY A: Acute hypoxemic hypercapnic respiratory failure secondary to sepsis + unable to protect airway + HCAP +/- Pulm edema H/O Asthma P:   She was very dyssynchronous with the vent and was air hungry. Very uncomfortable. I had to sedate her more. Keep her on the ventilator. Not ready for weaning. We'll go back to surgery tomorrow. Maintain Oxygen >90 Pulmonary Hygiene  Neb meds with pulmicort and atrovent   CARDIOVASCULAR A:  Septic shock 2/2 necrotizing fasciitis. H/O HTN, PE P:  Cardiac Monitoring  Maintain MAP >65 Wean Neo to achieve MAP goal > 65 Hold home Cardizem, Lisinopril, Spironolactone     RENAL A:   Acute Kidney Injury  Metabolic acidosis P:   Trend BMP Replace electrolytes as needed  We'll start bicarbonate infusion because of worsening acidosis.   GASTROINTESTINAL A:   Diarrhea  P:   NPO    HEMATOLOGIC A:   Anemia  P:  Trend CBC Transfuse for Hbg <7  INFECTIOUS A:   Septic Shock secondary to necrotizing wound of buttocks. Streptococcus viridans in culture.  S/P debridement of buttocks.  P:   Cont Vancomycin, Zosyn, and Clindamycin  Plan  for repeat debridement in the morning.   ENDOCRINE A:   No issues    P:   Trend Glucose and SSI   NEUROLOGIC A:   Acute Pain  H/O Anxiety/Depression  Vent  dyssynchrony. P:   RASS goal: 0 Will switch to fentanyl drip and Versed pushes.   FAMILY  - Updates: husband updated at bedside on 4/6. No family at bedside today.    I spent  30 minutes of Critical Care time with this patient today.   Monica Becton, MD 02/17/2017, 1:51 PM [facility] Pager [phone] After 3 pm or if no answer, call [phone]

## 2017-02-18 ENCOUNTER — Inpatient Hospital Stay (HOSPITAL_COMMUNITY): Payer: Self-pay

## 2017-02-18 LAB — BLOOD GAS, ARTERIAL
Acid-base deficit: 6.4 mmol/L — ABNORMAL HIGH (ref 0.0–2.0)
Bicarbonate: 18.3 mmol/L — ABNORMAL LOW (ref 20.0–28.0)
Drawn by: 330991
FIO2: 40
O2 Saturation: 96.3 %
PATIENT TEMPERATURE: 98.6
PEEP: 8 cmH2O
RATE: 35 resp/min
VT: 600 mL
pCO2 arterial: 34.8 mmHg (ref 32.0–48.0)
pH, Arterial: 7.34 — ABNORMAL LOW (ref 7.350–7.450)
pO2, Arterial: 79.9 mmHg — ABNORMAL LOW (ref 83.0–108.0)

## 2017-02-18 LAB — BASIC METABOLIC PANEL
ANION GAP: 11 (ref 5–15)
BUN: 31 mg/dL — ABNORMAL HIGH (ref 6–20)
CALCIUM: 8.1 mg/dL — AB (ref 8.9–10.3)
CO2: 19 mmol/L — ABNORMAL LOW (ref 22–32)
Chloride: 109 mmol/L (ref 101–111)
Creatinine, Ser: 1.75 mg/dL — ABNORMAL HIGH (ref 0.44–1.00)
GFR, EST AFRICAN AMERICAN: 41 mL/min — AB (ref >60)
GFR, EST NON AFRICAN AMERICAN: 35 mL/min — AB (ref >60)
GLUCOSE: 123 mg/dL — AB (ref 65–99)
POTASSIUM: 3.2 mmol/L — AB (ref 3.5–5.1)
SODIUM: 139 mmol/L (ref 135–145)

## 2017-02-18 LAB — MAGNESIUM
MAGNESIUM: 2 mg/dL (ref 1.7–2.4)
MAGNESIUM: 1.9 mg/dL (ref 1.7–2.4)
Magnesium: 2.1 mg/dL (ref 1.7–2.4)

## 2017-02-18 LAB — GLUCOSE, CAPILLARY
GLUCOSE-CAPILLARY: 103 mg/dL — AB (ref 65–99)
GLUCOSE-CAPILLARY: 120 mg/dL — AB (ref 65–99)
GLUCOSE-CAPILLARY: 127 mg/dL — AB (ref 65–99)
GLUCOSE-CAPILLARY: 129 mg/dL — AB (ref 65–99)
Glucose-Capillary: 108 mg/dL — ABNORMAL HIGH (ref 65–99)
Glucose-Capillary: 124 mg/dL — ABNORMAL HIGH (ref 65–99)

## 2017-02-18 LAB — PHOSPHORUS
PHOSPHORUS: 2.8 mg/dL (ref 2.5–4.6)
Phosphorus: 2.8 mg/dL (ref 2.5–4.6)
Phosphorus: 2.4 mg/dL — ABNORMAL LOW (ref 2.5–4.6)

## 2017-02-18 LAB — CBC
HCT: 24.5 % — ABNORMAL LOW (ref 36.0–46.0)
Hemoglobin: 7.5 g/dL — ABNORMAL LOW (ref 12.0–15.0)
MCH: 26.2 pg (ref 26.0–34.0)
MCHC: 30.6 g/dL (ref 30.0–36.0)
MCV: 85.7 fL (ref 78.0–100.0)
PLATELETS: 334 10*3/uL (ref 150–400)
RBC: 2.86 MIL/uL — AB (ref 3.87–5.11)
RDW: 17.7 % — AB (ref 11.5–15.5)
WBC: 23.2 10*3/uL — AB (ref 4.0–10.5)

## 2017-02-18 LAB — LACTIC ACID, PLASMA
LACTIC ACID, VENOUS: 1.2 mmol/L (ref 0.5–1.9)
Lactic Acid, Venous: 0.9 mmol/L (ref 0.5–1.9)

## 2017-02-18 LAB — PROCALCITONIN: PROCALCITONIN: 3.3 ng/mL

## 2017-02-18 MED ORDER — ACETYLCYSTEINE 20 % IN SOLN
4.0000 mL | Freq: Two times a day (BID) | RESPIRATORY_TRACT | Status: AC
  Administered 2017-02-18 – 2017-02-20 (×6): 4 mL via RESPIRATORY_TRACT
  Filled 2017-02-18 (×6): qty 4

## 2017-02-18 MED ORDER — SODIUM CHLORIDE 0.9 % IV SOLN
Freq: Once | INTRAVENOUS | Status: AC
  Administered 2017-02-18: 06:00:00 via INTRAVENOUS
  Filled 2017-02-18: qty 1000

## 2017-02-18 MED ORDER — SODIUM BICARBONATE 8.4 % IV SOLN
INTRAVENOUS | Status: AC
  Filled 2017-02-18: qty 50

## 2017-02-18 MED ORDER — VITAL HIGH PROTEIN PO LIQD
1000.0000 mL | ORAL | Status: DC
  Administered 2017-02-18 (×9)
  Administered 2017-02-18: 1000 mL
  Administered 2017-02-19: 09:00:00

## 2017-02-18 MED ORDER — PRO-STAT SUGAR FREE PO LIQD
60.0000 mL | Freq: Three times a day (TID) | ORAL | Status: DC
  Administered 2017-02-18 – 2017-02-19 (×4): 60 mL
  Filled 2017-02-18 (×5): qty 60

## 2017-02-18 NOTE — Progress Notes (Signed)
Brief Nutrition Note  Consult received for enteral/tube feeding initiation and management.  Adult Enteral Nutrition Protocol initiated. Full assessment completed 4/7. This RD placed orders according to recommendations provided.  [facility] RD will follow-up.  Admitting Dx: Septic shock ([place]) [A41.9, R65.21] Sepsis ([place]) [A41.9] Necrotizing soft tissue infection ([place]) [M72.6]  Body mass index is 73.55 kg/m. Pt meets criteria for morbid obesity based on current BMI.  Labs:   Recent Labs Lab 02/16/17 0502 02/17/17 0339 02/18/17 0420  NA 135 137 139  K 3.7 4.3 3.2*  CL 105 108 109  CO2 17* 18* 19*  BUN 32* 34* 31*  CREATININE 2.42* 1.92* 1.75*  CALCIUM 8.2* 8.4* 8.1*  MG 1.5* 2.0 2.0  PHOS 2.7 4.8* 2.8  GLUCOSE 95 106* 123*    Megan Bibles, MS, RD, LDN Pager: [phone] After Hours Pager: [phone]

## 2017-02-18 NOTE — Progress Notes (Signed)
2 Days Post-Op  Subjective: On vent less pressors  Objective: Vital signs in last 24 hours: Temp:  [98 F (36.7 C)-98.7 F (37.1 C)] 98 F (36.7 C) (04/08 0325) Pulse Rate:  [92-117] 97 (04/08 0700) Resp:  [10-35] 35 (04/08 0700) BP: (96-141)/(37-64) 123/55 (04/08 0700) SpO2:  [95 %-100 %] 98 % (04/08 0700) FiO2 (%):  [40 %-60 %] 40 % (04/08 0401) Last BM Date:  (PTA)  Intake/Output from previous day: 04/07 0701 - 04/08 0700 In: 4934.4 [I.V.:4209.4; IV Piggyback:725] Out: 9622 [WLNLG:9211] Intake/Output this shift: No intake/output data recorded.  Incision/Wound:reviewed with nursing staff For change in OR in am   Lab Results:   Recent Labs  02/17/17 0339 02/18/17 0420  WBC 38.9* 23.2*  HGB 8.2* 7.5*  HCT 27.4* 24.5*  PLT 386 334   BMET  Recent Labs  02/17/17 0339 02/18/17 0420  NA 137 139  K 4.3 3.2*  CL 108 109  CO2 18* 19*  GLUCOSE 106* 123*  BUN 34* 31*  CREATININE 1.92* 1.75*  CALCIUM 8.4* 8.1*   PT/INR No results for input(s): LABPROT, INR in the last 72 hours. ABG  Recent Labs  02/17/17 1755 02/18/17 0230  PHART 7.158* 7.340*  HCO3 17.0* 18.3*    Studies/Results: Dg Chest Port 1 View  Result Date: 02/17/2017 CLINICAL DATA:  Intubation. EXAM: PORTABLE CHEST 1 VIEW COMPARISON:  February 16, 2017 FINDINGS: The ETT has been pulled back somewhat in the interval and terminates 2.8 cm below the thoracic inlet in adequate position. No pneumothorax. A right central line terminates in the central SVC. Increasing bilateral pulmonary opacities, right greater than left suggesting multifocal infiltrate/pneumonia. Cardiomegaly persists. IMPRESSION: 1. The ET tube has been pulled back but still terminates nearly 3 cm below the thoracic inlet in adequate position. Other support apparatus is stable. 2. Increasing bilateral pulmonary opacities. The appearance is concerning for multifocal pneumonia. Recommend clinical correlation and attention on follow-up.  Electronically Signed   By: Dorise Bullion III M.D   On: 02/17/2017 07:44   Dg Chest Port 1 View  Result Date: 02/16/2017 CLINICAL DATA:  Postoperative debridement of gluteal necrotic lesion. Hypoxia. EXAM: PORTABLE CHEST 1 VIEW COMPARISON:  February 15, 2017 FINDINGS: Endotracheal tube tip is 4.7 cm above the carina. Central catheter tip is in the superior vena cava. Nasogastric tube tip is at the gastroesophageal junction. The tube deviates toward the right in the lower chest, likely due to a hiatal hernia. There is no appreciable edema or consolidation. There is right midlung atelectatic change. There is cardiomegaly with pulmonary venous hypertension. No evident adenopathy. No bone lesions. IMPRESSION: Tube and catheter positions as described without pneumothorax. There is pulmonary vascular congestion. There is right midlung atelectasis. No frank edema or consolidation. Electronically Signed   By: Lowella Grip III M.D.   On: 02/16/2017 11:08   Dg Abd Portable 1v  Result Date: 02/16/2017 CLINICAL DATA:  NG tube placement EXAM: PORTABLE ABDOMEN - 1 VIEW COMPARISON:  None. FINDINGS: Nasogastric tube with the tip projecting over the body of the stomach. There is no bowel dilatation to suggest obstruction. There is no evidence of pneumoperitoneum, portal venous gas or pneumatosis. There are no pathologic calcifications along the expected course of the ureters. The osseous structures are unremarkable. IMPRESSION: Nasogastric tube with the tip projecting over the body of the stomach. Electronically Signed   By: Kathreen Devoid   On: 02/16/2017 11:54    Anti-infectives: Anti-infectives    Start  Dose/Rate Route Frequency Ordered Stop   02/16/17 2200  clindamycin (CLEOCIN) IVPB 900 mg     900 mg 100 mL/hr over 30 Minutes Intravenous Every 8 hours 02/16/17 1418     02/16/17 0600  clindamycin (CLEOCIN) IVPB 600 mg  Status:  Discontinued     600 mg 100 mL/hr over 30 Minutes Intravenous Every 8 hours  02/16/17 0352 02/16/17 1418   02/16/17 0400  piperacillin-tazobactam (ZOSYN) IVPB 3.375 g     3.375 g 12.5 mL/hr over 240 Minutes Intravenous Every 8 hours 02/16/17 0400     02/16/17 0400  vancomycin (VANCOCIN) 2,000 mg in sodium chloride 0.9 % 500 mL IVPB     2,000 mg 250 mL/hr over 120 Minutes Intravenous Every 24 hours 02/16/17 0400     02/15/17 2145  clindamycin (CLEOCIN) IVPB 600 mg     600 mg 100 mL/hr over 30 Minutes Intravenous  Once 02/15/17 2140 02/15/17 2347   02/15/17 2000  piperacillin-tazobactam (ZOSYN) IVPB 3.375 g     3.375 g 100 mL/hr over 30 Minutes Intravenous  Once 02/15/17 1955 02/15/17 2131   02/15/17 2000  vancomycin (VANCOCIN) IVPB 1000 mg/200 mL premix     1,000 mg 200 mL/hr over 60 Minutes Intravenous  Once 02/15/17 1955 02/15/17 2348      Assessment/Plan: s/p Procedure(s): DEBRIDEMENT LEFT BUTTOCK ABSCESS (Left) OR monday with Dr Donne Hazel for dressing change and possible debridement  Ostomy given her size would be extremely difficult and and may be better off without one  Will see how wound cleans up   LOS: 2 days    Joesphine Schemm A. 02/18/2017

## 2017-02-18 NOTE — Progress Notes (Addendum)
PULMONARY / CRITICAL CARE MEDICINE   Name: Megan Ortiz MRN: [id number] DOB: August 20, 1976    ADMISSION DATE:  02/15/2017 CONSULTATION DATE:  02/16/2017  REFERRING MD:  Dr. Oleta Mouse, EDP  CHIEF COMPLAINT:  Sepsis   HISTORY OF PRESENT ILLNESS:   41 year old female with PMH of anemia, anxiety/depression, asthma, gastric ulcer, HTN, PE, and morbid obesity. Presents to [facility] ED on 4/5 with hypotension, tachycardia, and temp of 101.5. States that she has had a few days of diarrhea and decreased oral intake. Upon arrival to ED it was revealed that patient had an enlarging necrotizing wound of the buttocks (Patient states she first noted an "blister" about two days ago) with Lactic acid 4 and WBC 39. During Stay in ED patient received 6L NS bolus and started of Levophed. Patient was transferred to [facility] for surgical debridement of wound with Dr.Thompson. PCCM to admit.    SUBJECTIVE:  Has been weaned to only Neo; sedated and no longer having vent dyssynchrony or air hunger.    VITAL SIGNS: BP (!) 123/55   Pulse 97   Temp 98 F (36.7 C) (Oral)   Resp (!) 35   Ht 5\' 5"  (1.651 m)   Wt (!) 200.5 kg (442 lb)   LMP 02/01/2017 (Approximate)   SpO2 98%   BMI 73.55 kg/m   HEMODYNAMICS:    VENTILATOR SETTINGS: Vent Mode: PRVC FiO2 (%):  [40 %-60 %] 40 % Set Rate:  [20 bmp-35 bmp] 35 bmp Vt Set:  [600 mL] 600 mL PEEP:  [8 cmH20] 8 cmH20 Plateau Pressure:  [25 OZY24-82 cmH20] 25 cmH20  INTAKE / OUTPUT: I/O last 3 completed shifts: In: 7777.6 [I.V.:6152.6; IV Piggyback:1625] Out: 5003 [BCWUG:8916]  PHYSICAL EXAMINATION: General:  Adult female, NAD, ill appearing   Neuro: Sedated, intubated.  HEENT:  Normocephalic  Cardiovascular:  RRR, no MRG, N S1/S2 Lungs:  Distant breath sounds, non-labored Abdomen:  Obese, non-tender, active bowel sounds  Musculoskeletal:  No acute  Skin:  Warm, dry, open wound to buttocks - not evaluated at this time due to need for multiple  assistance  LABS:  BMET  Recent Labs Lab 02/16/17 0502 02/17/17 0339 02/18/17 0420  NA 135 137 139  K 3.7 4.3 3.2*  CL 105 108 109  CO2 17* 18* 19*  BUN 32* 34* 31*  CREATININE 2.42* 1.92* 1.75*  GLUCOSE 95 106* 123*    Electrolytes  Recent Labs Lab 02/16/17 0502 02/17/17 0339 02/18/17 0420  CALCIUM 8.2* 8.4* 8.1*  MG 1.5* 2.0 2.0  PHOS 2.7 4.8* 2.8    CBC  Recent Labs Lab 02/16/17 1315 02/17/17 0339 02/18/17 0420  WBC 53.8* 38.9* 23.2*  HGB 8.9* 8.2* 7.5*  HCT 28.8* 27.4* 24.5*  PLT 425* 386 334    Coag's No results for input(s): APTT, INR in the last 168 hours.  Sepsis Markers  Recent Labs Lab 02/16/17 0037 02/16/17 0502 02/17/17 0339 02/18/17 0000 02/18/17 0420  LATICACIDVEN 1.37  --   --  0.9 1.2  PROCALCITON  --  6.82 5.69  --  3.30    ABG  Recent Labs Lab 02/17/17 1128 02/17/17 1755 02/18/17 0230  PHART 7.166* 7.158* 7.340*  PCO2ART 40.6 48.0 34.8  PO2ART 131.0* 76.0* 79.9*    Liver Enzymes  Recent Labs Lab 02/15/17 1955  AST 33  ALT 19  ALKPHOS 69  BILITOT 0.8  ALBUMIN 3.0*    Cardiac Enzymes  Recent Labs Lab 02/16/17 0502  TROPONINI 0.11*    Glucose  Recent Labs Lab 02/17/17 0859 02/17/17 1223 02/17/17 1631 02/17/17 1927 02/18/17 0022 02/18/17 0324  GLUCAP 109* 108* 101* 98 103* 108*    Imaging No results found.   STUDIES:  CXR 4/5 > Mild enlargement of the cardiopericardial silhouette, pna vs atelectasis at the left base   CULTURES: Blood 4/5 > Wound 4/5 > strep viridans  ANTIBIOTICS: Clindamycin 4/5 > Vancomycin 4/5 > Zosyn 4/5 >  SIGNIFICANT EVENTS: 4/5 > Presents to ED > sepsis secondary to necrotizing wound  4/6 > OR debridement of wound > remained intubated as plan to go back to OR > 3 pressors; no A line was able to be obtained x7 attempts  LINES/TUBES: CVC 4/6 RIJ  DISCUSSION: 41 year old morbidly obese female presents to ED with septic shock secondary to necrotizing  buttock wound. Transfered to [facility] for surgical debridement.   ASSESSMENT / PLAN:  PULMONARY A: Acute hypoxemic hypercapnic respiratory failure secondary to sepsis + unable to protect airway + HCAP +/- Pulm edema H/O Asthma P:   Intubated FiO2 40, PEEP 8, RR 35, TV 600 - no weaning at this time as going back to OR today likely Maintain Oxygen >90 Pulmonary Hygiene  Neb meds with pulmicort and atrovent   CARDIOVASCULAR A:  Septic shock 2/2 necrotizing fasciitis. H/O HTN, PE P:  Cardiac Monitoring  Maintain MAP >65 Wean Neo to achieve MAP goal > 65 Hold home Cardizem, Lisinopril, Spironolactone     RENAL A:   Acute Kidney Injury  Metabolic acidosis, improving since bicarb added Lactic acid normal this AM Hypokalemia P:   Trend BMP Replace electrolytes as needed  Bicarbonate infusion    GASTROINTESTINAL A:   Diarrhea; no further since admission P:   Tube feeds   HEMATOLOGIC A:   Anemia  Hgb trending down, 7.5 this AM P:  Trend CBC Transfuse for Hbg <7  INFECTIOUS A:   Septic Shock secondary to necrotizing wound of buttocks. Streptococcus viridans in culture.  S/P debridement of buttocks on 4/6.  P:   Cont Vancomycin, Zosyn, and Clindamycin  Plan for repeat debridement today possibly.   ENDOCRINE A:   No issues    P:   Trend Glucose and SSI   NEUROLOGIC A:   Acute Pain  H/O Anxiety/Depression  Vent  dyssynchrony. P:   RASS goal: 0 Fentanyl drip and Versed pushes.  Alphonzo Grieve, MD IMTS - PGY1  ATTENDING NOTE / ATTESTATION NOTE :   I have discussed the case with the resident/APP  Dr. Jari Favre   I agree with the resident/APP's  history, physical examination, assessment, and plans.    I have edited the above note and modified it according to our agreed history, physical examination, assessment and plan.   Patient admitted with septic shock relating to necrotizing fasciitis. Status post fluid resuscitation. Remains critically ill.  Blood pressure is better although still on levo fed. Tachycardic. Sedated. On the ventilator. Morbidly obese. Crackles at the bases. Good S1 and S2. Diminished bowel sounds. Grade 2 edema. Continue ventilatory support. Continue antibiotics. No weaning. Plan for surgery tomorrow or Tuesday. Start tube  feeds. Keep sedated.  I spent  30   minutes of Critical Care time with this patient today. This is my time spent independent of the APP or resident.   Family : No family at bedside.    Monica Becton, MD 02/18/2017, 8:19 PM [facility] Pager [phone] After 3 pm or if no answer,  call [phone]

## 2017-02-18 NOTE — Progress Notes (Signed)
eLink Physician-Brief Progress Note Patient Name: Megan Ortiz DOB: [birth year] MRN: [id number]   Date of Service  02/18/2017  HPI/Events of Note  Hypokalemia  eICU Interventions  Potassium replaced     Intervention Category Intermediate Interventions: Electrolyte abnormality - evaluation and management  Orlinda Slomski 02/18/2017, 5:37 AM

## 2017-02-19 ENCOUNTER — Encounter (HOSPITAL_COMMUNITY): Payer: Self-pay | Admitting: General Surgery

## 2017-02-19 ENCOUNTER — Inpatient Hospital Stay (HOSPITAL_COMMUNITY): Payer: Self-pay | Admitting: Certified Registered Nurse Anesthetist

## 2017-02-19 ENCOUNTER — Encounter (HOSPITAL_COMMUNITY)
Admission: EM | Disposition: A | Discharge status: Skilled Nursing Facility | Payer: Self-pay | Source: Home / Self Care | Attending: Internal Medicine

## 2017-02-19 HISTORY — PX: IRRIGATION AND DEBRIDEMENT BUTTOCKS: SHX6601

## 2017-02-19 LAB — GLUCOSE, CAPILLARY
GLUCOSE-CAPILLARY: 111 mg/dL — AB (ref 65–99)
GLUCOSE-CAPILLARY: 112 mg/dL — AB (ref 65–99)
GLUCOSE-CAPILLARY: 98 mg/dL (ref 65–99)
Glucose-Capillary: 103 mg/dL — ABNORMAL HIGH (ref 65–99)
Glucose-Capillary: 103 mg/dL — ABNORMAL HIGH (ref 65–99)
Glucose-Capillary: 100 mg/dL — ABNORMAL HIGH (ref 65–99)
Glucose-Capillary: 108 mg/dL — ABNORMAL HIGH (ref 65–99)

## 2017-02-19 LAB — CBC
HCT: 20.8 % — ABNORMAL LOW (ref 36.0–46.0)
Hemoglobin: 6.9 g/dL — CL (ref 12.0–15.0)
MCH: 27.7 pg (ref 26.0–34.0)
MCHC: 33.2 g/dL (ref 30.0–36.0)
MCV: 83.5 fL (ref 78.0–100.0)
Platelets: 285 10*3/uL (ref 150–400)
RBC: 2.49 MIL/uL — ABNORMAL LOW (ref 3.87–5.11)
RDW: 17.7 % — AB (ref 11.5–15.5)
WBC: 18.6 10*3/uL — ABNORMAL HIGH (ref 4.0–10.5)

## 2017-02-19 LAB — BASIC METABOLIC PANEL
ANION GAP: 11 (ref 5–15)
BUN: 40 mg/dL — AB (ref 6–20)
CALCIUM: 7.8 mg/dL — AB (ref 8.9–10.3)
CO2: 20 mmol/L — AB (ref 22–32)
Chloride: 110 mmol/L (ref 101–111)
Creatinine, Ser: 1.7 mg/dL — ABNORMAL HIGH (ref 0.44–1.00)
GFR calc Af Amer: 42 mL/min — ABNORMAL LOW (ref >60)
GFR, EST NON AFRICAN AMERICAN: 36 mL/min — AB (ref >60)
GLUCOSE: 99 mg/dL (ref 65–99)
Potassium: 3.2 mmol/L — ABNORMAL LOW (ref 3.5–5.1)
Sodium: 141 mmol/L (ref 135–145)

## 2017-02-19 LAB — PHOSPHORUS
Phosphorus: 2.8 mg/dL (ref 2.5–4.6)
Phosphorus: 4 mg/dL (ref 2.5–4.6)

## 2017-02-19 LAB — POCT I-STAT 3, ART BLOOD GAS (G3+)
ACID-BASE DEFICIT: 4 mmol/L — AB (ref 0.0–2.0)
BICARBONATE: 19.8 mmol/L — AB (ref 20.0–28.0)
O2 Saturation: 97 %
PH ART: 7.391 (ref 7.350–7.450)
TCO2: 21 mmol/L (ref 0–100)
pCO2 arterial: 32.7 mmHg (ref 32.0–48.0)
pO2, Arterial: 91 mmHg (ref 83.0–108.0)

## 2017-02-19 LAB — HEMOGLOBIN AND HEMATOCRIT, BLOOD
HEMATOCRIT: 24.6 % — AB (ref 36.0–46.0)
HEMOGLOBIN: 7.9 g/dL — AB (ref 12.0–15.0)

## 2017-02-19 LAB — MAGNESIUM
Magnesium: 2.1 mg/dL (ref 1.7–2.4)
Magnesium: 2 mg/dL (ref 1.7–2.4)

## 2017-02-19 LAB — PREPARE RBC (CROSSMATCH)

## 2017-02-19 LAB — TRIGLYCERIDES: TRIGLYCERIDES: 143 mg/dL (ref <150)

## 2017-02-19 LAB — VANCOMYCIN, TROUGH: VANCOMYCIN TR: 25 ug/mL — AB (ref 15–20)

## 2017-02-19 SURGERY — IRRIGATION AND DEBRIDEMENT BUTTOCKS
Anesthesia: General | Site: Buttocks | Laterality: Left

## 2017-02-19 MED ORDER — VITAL HIGH PROTEIN PO LIQD
1000.0000 mL | ORAL | Status: DC
  Administered 2017-02-19 (×3): 1000 mL
  Administered 2017-02-19 – 2017-02-20 (×6)

## 2017-02-19 MED ORDER — FENTANYL CITRATE (PF) 250 MCG/5ML IJ SOLN
INTRAMUSCULAR | Status: AC
  Filled 2017-02-19: qty 5

## 2017-02-19 MED ORDER — FUROSEMIDE 10 MG/ML IJ SOLN
40.0000 mg | Freq: Once | INTRAMUSCULAR | Status: AC
  Administered 2017-02-19: 40 mg via INTRAVENOUS
  Filled 2017-02-19: qty 4

## 2017-02-19 MED ORDER — 0.9 % SODIUM CHLORIDE (POUR BTL) OPTIME
TOPICAL | Status: DC | PRN
  Administered 2017-02-19: 1000 mL

## 2017-02-19 MED ORDER — ROCURONIUM BROMIDE 100 MG/10ML IV SOLN
INTRAVENOUS | Status: DC | PRN
  Administered 2017-02-19: 50 mg via INTRAVENOUS

## 2017-02-19 MED ORDER — SALINE SPRAY 0.65 % NA SOLN
1.0000 | NASAL | Status: DC | PRN
  Filled 2017-02-19: qty 44

## 2017-02-19 MED ORDER — FENTANYL CITRATE (PF) 100 MCG/2ML IJ SOLN
INTRAMUSCULAR | Status: DC | PRN
  Administered 2017-02-19: 150 ug via INTRAVENOUS
  Administered 2017-02-19: 100 ug via INTRAVENOUS

## 2017-02-19 MED ORDER — PRO-STAT SUGAR FREE PO LIQD
60.0000 mL | Freq: Four times a day (QID) | ORAL | Status: DC
  Administered 2017-02-19 – 2017-02-20 (×3): 60 mL
  Filled 2017-02-19 (×5): qty 60

## 2017-02-19 MED ORDER — ACETAMINOPHEN 160 MG/5ML PO SOLN
650.0000 mg | Freq: Four times a day (QID) | ORAL | Status: DC | PRN
  Administered 2017-02-19: 650 mg
  Filled 2017-02-19: qty 20.3

## 2017-02-19 MED ORDER — SODIUM CHLORIDE 0.9 % IV SOLN
Freq: Once | INTRAVENOUS | Status: AC
  Administered 2017-02-19: 10:00:00 via INTRAVENOUS

## 2017-02-19 MED ORDER — ROCURONIUM BROMIDE 50 MG/5ML IV SOSY
PREFILLED_SYRINGE | INTRAVENOUS | Status: AC
  Filled 2017-02-19: qty 5

## 2017-02-19 MED ORDER — MIDAZOLAM HCL 2 MG/2ML IJ SOLN
INTRAMUSCULAR | Status: DC | PRN
  Administered 2017-02-19: 2 mg via INTRAVENOUS

## 2017-02-19 MED ORDER — MIDAZOLAM HCL 2 MG/2ML IJ SOLN
INTRAMUSCULAR | Status: AC
  Filled 2017-02-19: qty 2

## 2017-02-19 MED ORDER — LACTATED RINGERS IV SOLN
INTRAVENOUS | Status: DC | PRN
  Administered 2017-02-19: 13:00:00 via INTRAVENOUS

## 2017-02-19 MED ORDER — PROPOFOL 10 MG/ML IV BOLUS
INTRAVENOUS | Status: AC
  Filled 2017-02-19: qty 20

## 2017-02-19 MED ORDER — PHENOL 1.4 % MT LIQD
1.0000 | OROMUCOSAL | Status: DC | PRN
  Administered 2017-02-23: 1 via OROMUCOSAL
  Filled 2017-02-19: qty 177

## 2017-02-19 MED ORDER — SODIUM CHLORIDE 0.9 % IV SOLN
30.0000 meq | Freq: Once | INTRAVENOUS | Status: AC
  Administered 2017-02-19: 30 meq via INTRAVENOUS
  Filled 2017-02-19 (×2): qty 15

## 2017-02-19 SURGICAL SUPPLY — 26 items
COVER SURGICAL LIGHT HANDLE (MISCELLANEOUS) ×3 IMPLANT
DRAPE LAPAROSCOPIC ABDOMINAL (DRAPES) ×3 IMPLANT
DRSG PAD ABDOMINAL 8X10 ST (GAUZE/BANDAGES/DRESSINGS) ×6 IMPLANT
ELECT REM PT RETURN 9FT ADLT (ELECTROSURGICAL) ×3
ELECTRODE REM PT RTRN 9FT ADLT (ELECTROSURGICAL) ×1 IMPLANT
GLOVE BIO SURGEON STRL SZ7 (GLOVE) ×3 IMPLANT
GLOVE BIOGEL PI IND STRL 7.5 (GLOVE) ×1 IMPLANT
GLOVE BIOGEL PI INDICATOR 7.5 (GLOVE) ×2
GOWN STRL REUS W/ TWL LRG LVL3 (GOWN DISPOSABLE) ×2 IMPLANT
GOWN STRL REUS W/TWL LRG LVL3 (GOWN DISPOSABLE) ×4
KIT BASIN OR (CUSTOM PROCEDURE TRAY) ×3 IMPLANT
KIT ROOM TURNOVER OR (KITS) ×3 IMPLANT
NS IRRIG 1000ML POUR BTL (IV SOLUTION) ×3 IMPLANT
PACK SURGICAL SETUP 50X90 (CUSTOM PROCEDURE TRAY) ×3 IMPLANT
PAD ARMBOARD 7.5X6 YLW CONV (MISCELLANEOUS) ×12 IMPLANT
PENCIL BUTTON HOLSTER BLD 10FT (ELECTRODE) ×3 IMPLANT
SPONGE LAP 18X18 X RAY DECT (DISPOSABLE) ×3 IMPLANT
SUT VIC AB 2-0 CT1 27 (SUTURE) ×2
SUT VIC AB 2-0 CT1 TAPERPNT 27 (SUTURE) ×1 IMPLANT
SYR BULB 3OZ (MISCELLANEOUS) ×3 IMPLANT
TAPE CLOTH SURG 6X10 WHT LF (GAUZE/BANDAGES/DRESSINGS) ×3 IMPLANT
TOWEL OR 17X24 6PK STRL BLUE (TOWEL DISPOSABLE) ×3 IMPLANT
TOWEL OR 17X26 10 PK STRL BLUE (TOWEL DISPOSABLE) ×3 IMPLANT
TUBE CONNECTING 12'X1/4 (SUCTIONS) ×1
TUBE CONNECTING 12X1/4 (SUCTIONS) ×2 IMPLANT
YANKAUER SUCT BULB TIP NO VENT (SUCTIONS) ×3 IMPLANT

## 2017-02-19 NOTE — Progress Notes (Signed)
   02/19/17 2316  Respiratory Severity Assessment  Respiratory History 2  Breath Sounds 1  Respiratory Pattern 1  Cough 2  Chest X Ray 3  O2 Requirements 2  Level of Activity 2  Dyspnea 0  Score Total 13  Aerosolized Bronchodilators  Aerosolized bronchodilator indications Aerosolized bronchodilator indicated  Aerosolized bronchodilator goals Reduce WOB;Relief of shortness of SOB;Bronchodilation  Plan of care Hand held neb treatment  Oxygen Therapy  Oxygen therapy indications Oxygen therapy indicated  Oxygen therapy goals Treat hypoxia/hypoxemia  Plan of care (Ventilator)   RT assessed patient per RT protocol assessment. NO changes med to treatments at this time.

## 2017-02-19 NOTE — Op Note (Signed)
Preoperative diagnosis: necrotizing soft tissue infection left gluteal wound s/p debridement Postoperative diagnosis: same as above Procedure: dressing change left gluteal wound, exam under anesthesia Surgeon Dr Serita Grammes  Anesthesia general ebl minimal Complications none Specimens none Drains none dispo to recovery stable  Indications: This is a 42 yof who had gluteal soft tissue infection debrided by Dr Dalbert Batman last week.. She remains in the ICU.  It was recommended by Dr Dalbert Batman due to infection that she return to the or.  She is difficult to examine at bedside given her habitus  Procedure: After informed consent was obtained she was taken to OR. She was on abx.  She was already intubated. She was placed under anesthesia care and then rolled prone and appropriately padded.  She was prepped and draped in standard sterile surgical fashion. Timeout was performed.  I evaluated the entire wound.  This is all viable. I washed this and then packed with a kerlix gauze. Tolerated well.  Will now proceed with bedside dressing changes.

## 2017-02-19 NOTE — Transfer of Care (Signed)
Immediate Anesthesia Transfer of Care Note  Patient: Megan Ortiz  Procedure(s) Performed: Procedure(s): DEBRIDEMENT OF GLUTEAL WOUND (Left)  Patient Location: ICU  Anesthesia Type:General  Level of Consciousness: sedated and unresponsive  Airway & Oxygen Therapy: Patient remains intubated per anesthesia plan and Patient placed on Ventilator (see vital sign flow sheet for setting)  Post-op Assessment: Report given to RN and Post -op Vital signs reviewed and stable  Post vital signs: Reviewed and stable  Last Vitals:  Vitals:   02/19/17 1155 02/19/17 1200  BP:  (!) 101/50  Pulse:  (!) 110  Resp:  (!) 22  Temp: 37.8 C     Last Pain:  Vitals:   02/19/17 1155  TempSrc: Oral  PainSc:          Complications: No apparent anesthesia complications

## 2017-02-19 NOTE — Progress Notes (Signed)
Pharmacy Antibiotic Consult Note  Megan Ortiz is a 41 y.o. female admitted on 02/15/2017 with sepsis w/ presumed necrotizing fasciitis.  Pharmacy has been consulted for Vancocin and Zosyn dosing. Patient is currently afebrile with elevated wbc 18.6 (decreasing). Renal function improving, today's Scr 1.7 correlating to a nCrCl ~50 ml/min. Vancomycin level this morning was 25 (supratherapeutic). Per discussions with Drs. Alva and Svalina, vancomycin will be discontinued today based on wound culture results. Patient is s/p wound debridement 4/6, plans to return to OR today.  Plan: Discontinue Vancomycin  Zosyn 3.375g IV q8h (4 hour infusion) Continue clindamycin for toxin clearance at this time - will readdress tomorrow Monitor culture results and clinical improvement   Height: 5\' 5"  (165.1 cm) Weight: (!) 442 lb (200.5 kg) IBW/kg (Calculated) : 57  Temp (24hrs), Avg:99.9 F (37.7 C), Min:99.1 F (37.3 C), Max:100.1 F (37.8 C)   Recent Labs Lab 02/15/17 1955 02/15/17 2005 02/15/17 2033 02/16/17 0037 02/16/17 0502 02/16/17 1315 02/17/17 0339 02/18/17 0000 02/18/17 0420 02/19/17 0454 02/19/17 0747  WBC 39.2*  --   --   --  42.6* 53.8* 38.9*  --  23.2*  --  18.6*  CREATININE 2.41*  --   --   --  2.42*  --  1.92*  --  1.75* 1.70*  --   LATICACIDVEN  --  4.69* 2.6* 1.37  --   --   --  0.9 1.2  --   --   VANCOTROUGH  --   --   --   --   --   --   --   --   --  25*  --     Estimated Creatinine Clearance: 78.7 mL/min (A) (by C-G formula based on SCr of 1.7 mg/dL (H)).    Allergies  Allergen Reactions  . Aspirin     Due to hx of stomach ulcers    Antimicrobials this admission:  4/5 vanc >> 4/9 4/5 zosyn >> 4/5 clinda>>  Dose adjustments this admission:  4/6 Clindamycin increased to 900 mg TID d/t body habitus, deep tissue involvement 4/9 VT 25 (appropriately drawn) - vanc dc today  Microbiology results:  4/6 MRSA PCR: negative 4/5 BCx x2: ngtd 4/6 wound Cxr: few  viridans strep, possible anaerobe  Thank you for allowing pharmacy to be a part of this patient's care.  Belia Heman, PharmD PGY1 Pharmacy Resident [phone] (Pager) 02/19/2017 1:02 PM

## 2017-02-19 NOTE — Anesthesia Preprocedure Evaluation (Signed)
Anesthesia Evaluation  Patient identified by MRN, date of birth, ID band Patient awake    Reviewed: Allergy & Precautions, NPO status , Patient's Chart, lab work & pertinent test results  Airway Mallampati: Intubated       Dental  (+) Edentulous Upper, Edentulous Lower   Pulmonary asthma , former smoker,    Pulmonary exam normal breath sounds clear to auscultation       Cardiovascular hypertension, Normal cardiovascular exam Rhythm:Regular Rate:Normal     Neuro/Psych  Headaches, PSYCHIATRIC DISORDERS Anxiety  Neuromuscular disease    GI/Hepatic Neg liver ROS, hiatal hernia, PUD,   Endo/Other  Morbid obesity  Renal/GU Renal InsufficiencyRenal disease  negative genitourinary   Musculoskeletal Left Buttock abscess   Abdominal (+) + obese,   Peds  Hematology  (+) anemia , Septic Shock   Anesthesia Other Findings   Reproductive/Obstetrics                             Anesthesia Physical Anesthesia Plan  ASA: IV  Anesthesia Plan: General   Post-op Pain Management:    Induction: Inhalational  Airway Management Planned: Oral ETT  Additional Equipment:   Intra-op Plan:   Post-operative Plan: Post-operative intubation/ventilation  Informed Consent: I have reviewed the patients History and Physical, chart, labs and discussed the procedure including the risks, benefits and alternatives for the proposed anesthesia with the patient or authorized representative who has indicated his/her understanding and acceptance.     Plan Discussed with: CRNA  Anesthesia Plan Comments:         Anesthesia Quick Evaluation

## 2017-02-19 NOTE — Progress Notes (Signed)
Pt returned from OR and was placed back on full vent support

## 2017-02-19 NOTE — Progress Notes (Addendum)
ATTENDING NOTE: I have personally reviewed patient's available data, including medical history, events of note, physical examination and test results as part of my evaluation. I have discussed with resident/NP and other careteam providers such as pharmacist, RN and RRT & co-ordinated with consultants. In addition, I personally evaluated patient and elicited key history of   41 year old morbidly obese woman admitted with necrotizing fasciitis of wound on her left buttock, septic shock and achy I transferred to [facility] for surgical debridement.  On sedated, off pressors, S1-S2 normal, soft nontender abdomen, 1+ bipedal edema, buttock wound as characterized by surgery   Labs- lactate resolved, hyponatremia -improved, decreased leukocytosis, creatinine stable at 1.7-  0.6 in 2014 Chest x-ray-left lower lobe atelectasis/soft tissue shadow Impression/plan  Septic shock- resolved Empiric Zosyn and clindamycin, discontinue vancomycin for cellulitis/necrotizing fasciitis  Acute respiratory failure- ventilator settings were reviewed and adjusted, drop respiratory rate to 25, keep PEEP at 8 AKI - lower bicarbonate drip to 50/hour and discontinue in 24 hours  Hypokalemia will be repleted Anemia- transfuse 1 unit PRBC Updated family at bedside Rest per medical resident whose note is outlined above and that I agree with and edited in full.   The patient is critically ill with multiple organ systems failure and requires high complexity decision making for assessment and support, frequent evaluation and titration of therapies, application of advanced monitoring technologies and extensive interpretation of multiple databases. Critical Care Time devoted to patient care services described in this note independent of resident time is 35 minutes.   Rigoberto Noel MD

## 2017-02-19 NOTE — Progress Notes (Signed)
Nutrition Consult / Follow-up  DOCUMENTATION CODES:   Morbid obesity  INTERVENTION:    Vital High Protein at 10 ml/h (240 ml per day)  Pro-stat 60 ml QID   Provides 1040 kcal, 141 gm protein, 201 ml free water daily  Total intake with Propofol and TF will be 1510 kcal  NUTRITION DIAGNOSIS:   Increased nutrient needs related to wound healing as evidenced by estimated needs.  Ongoing  GOAL:   Provide needs based on ASPEN/SCCM guidelines  Met with TF  MONITOR:   Vent status, TF tolerance, Labs, I & O's, Skin  REASON FOR ASSESSMENT:   Consult Enteral/tube feeding initiation and management  ASSESSMENT:   41 year old female, PMHx anemia, anxiety/depression, gastric ulcer, HTN, PE and super/morbid obesity. Presented to ED with hypotension, tachycardia and fever. Had few days reported poor PO intake/diarrhea. Found to have septic shock 2/2 enlarging necrotizing wound to buttocks s/p surgical debridement 4/6.   Discussed patient in ICU rounds and with RN today. Patient is currently in the OR for debridement of necrotizing soft tissue infection L gluteal wound. Plans for bedside dressing changes from now on. Received MD Consult for TF initiation and management. TF was initiated on 4/8; currently on hold for procedure. Patient is currently intubated on ventilator support Temp (24hrs), Avg:99.9 F (37.7 C), Min:99.1 F (37.3 C), Max:100.1 F (37.8 C)  Propofol: 17.8 ml/hr providing 470 kcal from lipid. Labs reviewed: potassium 3.2 (L) Medications reviewed.  Diet Order:  Diet NPO time specified  Skin:  Wound (see comment) (necrotizing soft tissue infection L gluteal wound)  Last BM:  Unknown  Height:   Ht Readings from Last 1 Encounters:  02/16/17 _0  (1.651 m)    Weight:   Wt Readings from Last 1 Encounters:  02/17/17 (!) 442 lb (200.5 kg)    Ideal Body Weight:  56.82 kg  BMI:  Body mass index is 73.55 kg/m.  Estimated Nutritional Needs:   Kcal:   1250-1420  Protein:  142 gm   Fluid:  2 L  EDUCATION NEEDS:   No education needs identified at this time  Molli Barrows, [address], [place], [place] Pager [phone] After Hours Pager [phone]

## 2017-02-19 NOTE — Progress Notes (Signed)
3 Days Post-Op  Subjective: Intubated sedated  Objective: Vital signs in last 24 hours: Temp:  [98.1 F (36.7 C)-100.1 F (37.8 C)] 99.8 F (37.7 C) (04/09 0421) Pulse Rate:  [95-116] 104 (04/09 0800) Resp:  [28-35] 35 (04/09 0800) BP: (91-121)/(32-52) 112/43 (04/09 0800) SpO2:  [96 %-99 %] 96 % (04/09 0800) FiO2 (%):  [40 %] 40 % (04/09 0800) Last BM Date:  (PTA)  Intake/Output from previous day: 04/08 0701 - 04/09 0700 In: 4138.1 [I.V.:2515.7; NG/GT:809.9; IV Piggyback:812.5] Out: 4166 [Urine:1590] Intake/Output this shift: Total I/O In: 85.3 [I.V.:85.3] Out: 100 [Urine:100]  GI: soft obese  Will evaluate wound in or today  Lab Results:   Recent Labs  02/18/17 0420 02/19/17 0747  WBC 23.2* 18.6*  HGB 7.5* 6.9*  HCT 24.5* 20.8*  PLT 334 285   BMET  Recent Labs  02/18/17 0420 02/19/17 0454  NA 139 141  K 3.2* 3.2*  CL 109 110  CO2 19* 20*  GLUCOSE 123* 99  BUN 31* 40*  CREATININE 1.75* 1.70*  CALCIUM 8.1* 7.8*   PT/INR No results for input(s): LABPROT, INR in the last 72 hours. ABG  Recent Labs  02/17/17 1755 02/18/17 0230  PHART 7.158* 7.340*  HCO3 17.0* 18.3*    Studies/Results: Dg Chest Port 1 View  Result Date: 02/18/2017 CLINICAL DATA:  Respiratory failure  ET tube EXAM: PORTABLE CHEST 1 VIEW COMPARISON:  Chest x-rays dated 02/17/2017 and 02/16/2017. FINDINGS: Endotracheal tube is well positioned with tip just above the level of the carina. Enteric tube passes below the diaphragm. Right IJ central line is stable in position with tip at the level of the upper SVC. Stable cardiomegaly. Overall cardiomediastinal silhouette is stable. Patchy bilateral airspace opacities are stable compared to yesterday's exam, slightly increased compared to the earlier exam of 02/16/2017. No pneumothorax seen. IMPRESSION: 1. Patchy bilateral airspace opacities are stable compared to yesterday's exam, but increased compared to the earlier exam of 02/16/2017,  consistent with multifocal pneumonia versus asymmetric pulmonary edema. 2. Support apparatus, as detailed above. Endotracheal tube may have been advanced slightly since yesterday's study, now well positioned just above the level of the carina. 3. Stable cardiomegaly. Electronically Signed   By: Franki Cabot M.D.   On: 02/18/2017 08:17    Anti-infectives: Anti-infectives    Start     Dose/Rate Route Frequency Ordered Stop   02/16/17 2200  clindamycin (CLEOCIN) IVPB 900 mg     900 mg 100 mL/hr over 30 Minutes Intravenous Every 8 hours 02/16/17 1418     02/16/17 0600  clindamycin (CLEOCIN) IVPB 600 mg  Status:  Discontinued     600 mg 100 mL/hr over 30 Minutes Intravenous Every 8 hours 02/16/17 0352 02/16/17 1418   02/16/17 0400  piperacillin-tazobactam (ZOSYN) IVPB 3.375 g     3.375 g 12.5 mL/hr over 240 Minutes Intravenous Every 8 hours 02/16/17 0400     02/16/17 0400  vancomycin (VANCOCIN) 2,000 mg in sodium chloride 0.9 % 500 mL IVPB     2,000 mg 250 mL/hr over 120 Minutes Intravenous Every 24 hours 02/16/17 0400     02/15/17 2145  clindamycin (CLEOCIN) IVPB 600 mg     600 mg 100 mL/hr over 30 Minutes Intravenous  Once 02/15/17 2140 02/15/17 2347   02/15/17 2000  piperacillin-tazobactam (ZOSYN) IVPB 3.375 g     3.375 g 100 mL/hr over 30 Minutes Intravenous  Once 02/15/17 1955 02/15/17 2131   02/15/17 2000  vancomycin (VANCOCIN) IVPB 1000 mg/200 mL  premix     1,000 mg 200 mL/hr over 60 Minutes Intravenous  Once 02/15/17 1955 02/15/17 2348      Assessment/Plan: POD 3 incision/debridement gluteal wound  Will go to OR today for eua and possible further debridement Needs transfusion due to low hb Continue abx  [facility] 02/19/2017

## 2017-02-19 NOTE — Progress Notes (Signed)
PULMONARY / CRITICAL CARE MEDICINE   Name: Megan Ortiz MRN: [id number] DOB: 03/20/1976    ADMISSION DATE:  02/15/2017 CONSULTATION DATE:  02/16/2017  REFERRING MD:  Dr. Oleta Mouse, EDP  CHIEF COMPLAINT:  Sepsis   HISTORY OF PRESENT ILLNESS:   41 year old female with PMH of anemia, anxiety/depression, asthma, gastric ulcer, HTN, PE, and morbid obesity. Presents to [facility] ED on 4/5 with hypotension, tachycardia, and temp of 101.5. States that she has had a few days of diarrhea and decreased oral intake. Upon arrival to ED it was revealed that patient had an enlarging necrotizing wound of the buttocks (Patient states she first noted an "blister" about two days ago) with Lactic acid 4 and WBC 39. During Stay in ED patient received 6L NS bolus and started of Levophed. Patient was transferred to [facility] for surgical debridement of wound with Dr.Thompson. PCCM to admit.   SUBJECTIVE:  Has been weaned off of pressors. Responding and following some commands.   VITAL SIGNS: BP (!) 112/43 (BP Location: Right Wrist)   Pulse (!) 104   Temp 99.8 F (37.7 C) (Oral)   Resp (!) 35   Ht 5\' 5"  (1.651 m)   Wt (!) 200.5 kg (442 lb)   LMP 02/01/2017 (Approximate)   SpO2 96%   BMI 73.55 kg/m   HEMODYNAMICS:    VENTILATOR SETTINGS: Vent Mode: PRVC FiO2 (%):  [40 %] 40 % Set Rate:  [35 bmp] 35 bmp Vt Set:  [600 mL] 600 mL PEEP:  [8 cmH20] 8 cmH20 Plateau Pressure:  [24 cmH20-26 cmH20] 26 cmH20  INTAKE / OUTPUT: I/O last 3 completed shifts: In: 7736.7 [I.V.:5351.8; NG/GT:809.9; IV Piggyback:1575] Out: 4562 [BWLSL:3734; Emesis/NG output:10]  PHYSICAL EXAMINATION: General:  Adult female, NAD, ill appearing   Neuro: Sedated but able to follow commands, intubated.  HEENT:  Normocephalic  Cardiovascular:  RRR, no MRG, N S1/S2, whole body non pitting edema Lungs:  Distant breath sounds, non-labored Abdomen:  Obese, non-tender, active bowel sounds   Skin:  Warm, dry, open wound to buttocks -  not evaluated at this time due to need for multiple assistance  LABS:  BMET  Recent Labs Lab 02/17/17 0339 02/18/17 0420 02/19/17 0454  NA 137 139 141  K 4.3 3.2* 3.2*  CL 108 109 110  CO2 18* 19* 20*  BUN 34* 31* 40*  CREATININE 1.92* 1.75* 1.70*  GLUCOSE 106* 123* 99    Electrolytes  Recent Labs Lab 02/17/17 0339 02/18/17 0420 02/18/17 0915 02/18/17 1712 02/19/17 0454  CALCIUM 8.4* 8.1*  --   --  7.8*  MG 2.0 2.0 1.9 2.1 2.1  PHOS 4.8* 2.8 2.8 2.4* 2.8    CBC  Recent Labs Lab 02/17/17 0339 02/18/17 0420 02/19/17 0747  WBC 38.9* 23.2* 18.6*  HGB 8.2* 7.5* 6.9*  HCT 27.4* 24.5* 20.8*  PLT 386 334 285    Coag's No results for input(s): APTT, INR in the last 168 hours.  Sepsis Markers  Recent Labs Lab 02/16/17 0037 02/16/17 0502 02/17/17 0339 02/18/17 0000 02/18/17 0420  LATICACIDVEN 1.37  --   --  0.9 1.2  PROCALCITON  --  6.82 5.69  --  3.30    ABG  Recent Labs Lab 02/17/17 1128 02/17/17 1755 02/18/17 0230  PHART 7.166* 7.158* 7.340*  PCO2ART 40.6 48.0 34.8  PO2ART 131.0* 76.0* 79.9*    Liver Enzymes  Recent Labs Lab 02/15/17 1955  AST 33  ALT 19  ALKPHOS 69  BILITOT 0.8  ALBUMIN  3.0*    Cardiac Enzymes  Recent Labs Lab 02/16/17 0502  TROPONINI 0.11*    Glucose  Recent Labs Lab 02/18/17 0838 02/18/17 1251 02/18/17 1658 02/18/17 1928 02/19/17 0028 02/19/17 0420  GLUCAP 129* 124* 127* 120* 112* 103*    Imaging No results found.   STUDIES:  CXR 4/5 > Mild enlargement of the cardiopericardial silhouette, pna vs atelectasis at the left base  CXR 4/8 . Patchy bilateral airspace opacities consistent with multifocal pneumonia vs asymmetric pulm edema  CULTURES: Blood 4/5 > Wound 4/5 > strep viridans  ANTIBIOTICS: Clindamycin 4/5 > Zosyn 4/5 > Vancomycin 4/5 > d/c  SIGNIFICANT EVENTS: 4/5 > Presents to ED > sepsis secondary to necrotizing wound  4/6 > OR debridement of wound > remained intubated as  plan to go back to OR > 3 pressors; no A line was able to be obtained x7 attempts  LINES/TUBES: ETT 4/6 CVC 4/6 RIJ  DISCUSSION: 41 year old morbidly obese female presents to ED with septic shock secondary to necrotizing buttock wound. Transfered to [facility] for surgical debridement.   ASSESSMENT / PLAN:  PULMONARY A: Acute hypoxemic hypercapnic respiratory failure secondary to sepsis + unable to protect airway + HCAP +/- Pulm edema H/O Asthma P:   Intubated as going back to OR for debridement Acidosis resolved today; decrease RR to 25 Maintain Oxygen >90 Pulmonary Hygiene  Neb meds with pulmicort and Atrovent   CARDIOVASCULAR A:  Septic shock 2/2 necrotizing fasciitis. Weaned off of pressors for time being  H/O HTN, PE P:  Cardiac Monitoring  Maintain MAP >65 Hold home Cardizem, Lisinopril, Spironolactone     RENAL A:   Acute Kidney Injury - stable and slowly improving No longer acidotic Hypokalemia P:   Trend BMP Replace electrolytes as needed  Bicarbonate infusion at 72ml/hr for now; likely d/c tomorrow   GASTROINTESTINAL A:   Diarrhea; no further since admission P:   Tube feeds; NPO for now prior to surgery   HEMATOLOGIC A:   Anemia  Hgb trending down, 6.9 this AM likely in setting of sepsis P:  Trend CBC Transfuse for Hbg <7  INFECTIOUS A:   Septic Shock secondary to necrotizing wound of buttocks. Streptococcus viridans in culture.  S/P debridement of buttocks on 4/6.  P:   Cont Zosyn, and Clindamycin  D/c vanc Plan for repeat debridement today.   ENDOCRINE A:   No issues    P:   Trend Glucose and SSI   NEUROLOGIC A:   Acute Pain  H/O Anxiety/Depression  P:   RASS goal: 0 Fentanyl drip and Versed pushes.  Alphonzo Grieve, MD IMTS - PGY1

## 2017-02-20 ENCOUNTER — Encounter (HOSPITAL_COMMUNITY): Payer: Self-pay | Admitting: General Surgery

## 2017-02-20 ENCOUNTER — Inpatient Hospital Stay (HOSPITAL_COMMUNITY): Payer: Self-pay

## 2017-02-20 LAB — CULTURE, BLOOD (ROUTINE X 2)
Culture: NO GROWTH
Culture: NO GROWTH
SPECIAL REQUESTS: ADEQUATE
Special Requests: ADEQUATE

## 2017-02-20 LAB — GLUCOSE, CAPILLARY
GLUCOSE-CAPILLARY: 112 mg/dL — AB (ref 65–99)
GLUCOSE-CAPILLARY: 119 mg/dL — AB (ref 65–99)
Glucose-Capillary: 107 mg/dL — ABNORMAL HIGH (ref 65–99)
Glucose-Capillary: 117 mg/dL — ABNORMAL HIGH (ref 65–99)
Glucose-Capillary: 116 mg/dL — ABNORMAL HIGH (ref 65–99)
Glucose-Capillary: 124 mg/dL — ABNORMAL HIGH (ref 65–99)

## 2017-02-20 LAB — CBC
HCT: 24.2 % — ABNORMAL LOW (ref 36.0–46.0)
HEMOGLOBIN: 7.6 g/dL — AB (ref 12.0–15.0)
MCH: 26.9 pg (ref 26.0–34.0)
MCHC: 31.4 g/dL (ref 30.0–36.0)
MCV: 85.5 fL (ref 78.0–100.0)
Platelets: 320 10*3/uL (ref 150–400)
RBC: 2.83 MIL/uL — AB (ref 3.87–5.11)
RDW: 17.6 % — ABNORMAL HIGH (ref 11.5–15.5)
WBC: 22.7 10*3/uL — ABNORMAL HIGH (ref 4.0–10.5)

## 2017-02-20 LAB — BLOOD GAS, ARTERIAL
ACID-BASE DEFICIT: 2.3 mmol/L — AB (ref 0.0–2.0)
Bicarbonate: 22.2 mmol/L (ref 20.0–28.0)
DRAWN BY: 345601
FIO2: 40
O2 SAT: 95.9 %
PATIENT TEMPERATURE: 98.6
PCO2 ART: 39.7 mmHg (ref 32.0–48.0)
PEEP/CPAP: 8 cmH2O
PH ART: 7.366 (ref 7.350–7.450)
RATE: 25 resp/min
VT: 600 mL
pO2, Arterial: 82.7 mmHg — ABNORMAL LOW (ref 83.0–108.0)

## 2017-02-20 LAB — TYPE AND SCREEN
ABO/RH(D): O POS
ANTIBODY SCREEN: NEGATIVE
UNIT DIVISION: 0
Unit division: 0

## 2017-02-20 LAB — BASIC METABOLIC PANEL
ANION GAP: 10 (ref 5–15)
BUN: 40 mg/dL — ABNORMAL HIGH (ref 6–20)
CHLORIDE: 111 mmol/L (ref 101–111)
CO2: 23 mmol/L (ref 22–32)
Calcium: 7.9 mg/dL — ABNORMAL LOW (ref 8.9–10.3)
Creatinine, Ser: 1.46 mg/dL — ABNORMAL HIGH (ref 0.44–1.00)
GFR calc Af Amer: 51 mL/min — ABNORMAL LOW (ref >60)
GFR calc non Af Amer: 44 mL/min — ABNORMAL LOW (ref >60)
GLUCOSE: 107 mg/dL — AB (ref 65–99)
POTASSIUM: 3.5 mmol/L (ref 3.5–5.1)
Sodium: 144 mmol/L (ref 135–145)

## 2017-02-20 LAB — PHOSPHORUS: Phosphorus: 4.9 mg/dL — ABNORMAL HIGH (ref 2.5–4.6)

## 2017-02-20 LAB — MAGNESIUM: Magnesium: 2.1 mg/dL (ref 1.7–2.4)

## 2017-02-20 LAB — BPAM RBC
BLOOD PRODUCT EXPIRATION DATE: 201805032359
Blood Product Expiration Date: 201805032359
ISSUE DATE / TIME: 201804090952
UNIT TYPE AND RH: 5100
UNIT TYPE AND RH: 5100

## 2017-02-20 MED ORDER — SENNOSIDES-DOCUSATE SODIUM 8.6-50 MG PO TABS
1.0000 | ORAL_TABLET | Freq: Two times a day (BID) | ORAL | Status: DC
  Administered 2017-02-20 – 2017-02-22 (×4): 1
  Filled 2017-02-20 (×9): qty 1

## 2017-02-20 MED ORDER — VITAL HIGH PROTEIN PO LIQD: 1000.0000 mL | ORAL | Status: DC

## 2017-02-20 MED ORDER — FUROSEMIDE 10 MG/ML IJ SOLN
40.0000 mg | Freq: Two times a day (BID) | INTRAMUSCULAR | Status: DC
  Administered 2017-02-20 – 2017-03-02 (×21): 40 mg via INTRAVENOUS
  Filled 2017-02-20 (×23): qty 4

## 2017-02-20 MED ORDER — PRO-STAT SUGAR FREE PO LIQD
60.0000 mL | Freq: Three times a day (TID) | ORAL | Status: DC
  Administered 2017-02-20 – 2017-02-21 (×3): 60 mL
  Filled 2017-02-20 (×5): qty 60

## 2017-02-20 MED ORDER — VITAL HIGH PROTEIN PO LIQD
1000.0000 mL | ORAL | Status: DC
  Administered 2017-02-20: 1000 mL

## 2017-02-20 MED ORDER — POLYETHYLENE GLYCOL 3350 17 G PO PACK
17.0000 g | PACK | Freq: Every day | ORAL | Status: DC
  Filled 2017-02-20 (×4): qty 1

## 2017-02-20 NOTE — Progress Notes (Signed)
1 Day Post-Op  Subjective: Sedated, intubated  Objective: Vital signs in last 24 hours: Temp:  [98.9 F (37.2 C)-100.1 F (37.8 C)] 98.9 F (37.2 C) (04/10 0839) Pulse Rate:  [95-114] 114 (04/10 1030) Resp:  [17-29] 21 (04/10 1030) BP: (101-136)/(34-79) 123/79 (04/10 1030) SpO2:  [88 %-99 %] 88 % (04/10 1030) FiO2 (%):  [40 %-50 %] 50 % (04/10 0823) Weight:  [195 kg (429 lb 14.4 oz)-195 kg (430 lb)] 195 kg (429 lb 14.4 oz) (04/10 0200) Last BM Date:  (PTA)  Intake/Output from previous day: 04/09 0701 - 04/10 0700 In: 4431.4 [I.V.:2532.4; Blood:335; NG/GT:1264.1; IV Piggyback:300] Out: 2935 [Urine:2885; Blood:50] Intake/Output this shift: Total I/O In: 406.3 [I.V.:106.3; NG/GT:300] Out: 600 [Urine:600]  Incision/Wound:large left gluteal wound to just near anus, liquid stool, dressing changed today and all is viable  Lab Results:   Recent Labs  02/19/17 0747 02/19/17 1216 02/20/17 0308  WBC 18.6*  --  22.7*  HGB 6.9* 7.9* 7.6*  HCT 20.8* 24.6* 24.2*  PLT 285  --  320   BMET  Recent Labs  02/19/17 0454 02/20/17 0308  NA 141 144  K 3.2* 3.5  CL 110 111  CO2 20* 23  GLUCOSE 99 107*  BUN 40* 40*  CREATININE 1.70* 1.46*  CALCIUM 7.8* 7.9*   PT/INR No results for input(s): LABPROT, INR in the last 72 hours. ABG  Recent Labs  02/19/17 0856 02/20/17 0300  PHART 7.391 7.366  HCO3 19.8* 22.2    Studies/Results: Dg Chest Port 1 View  Result Date: 02/20/2017 CLINICAL DATA:  Respiratory failure.  ETT placement. EXAM: PORTABLE CHEST 1 VIEW COMPARISON:  02/18/2017.  Global FINDINGS: Stable cardiomegaly. Stable cardiomediastinal silhouette. Unchanged tubes and lines. BILATERAL pulmonary opacities, RIGHT greater than LEFT without significant improvement. IMPRESSION: Stable chest. Electronically Signed   By: Staci Righter M.D.   On: 02/20/2017 07:07    Anti-infectives: Anti-infectives    Start     Dose/Rate Route Frequency Ordered Stop   02/16/17 2200   clindamycin (CLEOCIN) IVPB 900 mg  Status:  Discontinued     900 mg 100 mL/hr over 30 Minutes Intravenous Every 8 hours 02/16/17 1418 02/20/17 0959   02/16/17 0600  clindamycin (CLEOCIN) IVPB 600 mg  Status:  Discontinued     600 mg 100 mL/hr over 30 Minutes Intravenous Every 8 hours 02/16/17 0352 02/16/17 1418   02/16/17 0400  piperacillin-tazobactam (ZOSYN) IVPB 3.375 g     3.375 g 12.5 mL/hr over 240 Minutes Intravenous Every 8 hours 02/16/17 0400     02/16/17 0400  vancomycin (VANCOCIN) 2,000 mg in sodium chloride 0.9 % 500 mL IVPB  Status:  Discontinued     2,000 mg 250 mL/hr over 120 Minutes Intravenous Every 24 hours 02/16/17 0400 02/19/17 0917   02/15/17 2145  clindamycin (CLEOCIN) IVPB 600 mg     600 mg 100 mL/hr over 30 Minutes Intravenous  Once 02/15/17 2140 02/15/17 2347   02/15/17 2000  piperacillin-tazobactam (ZOSYN) IVPB 3.375 g     3.375 g 100 mL/hr over 30 Minutes Intravenous  Once 02/15/17 1955 02/15/17 2131   02/15/17 2000  vancomycin (VANCOCIN) IVPB 1000 mg/200 mL premix     1,000 mg 200 mL/hr over 60 Minutes Intravenous  Once 02/15/17 1955 02/15/17 2348      Assessment/Plan: POD 4/1 debridement of gluteal wound/dressing change under anesthesia- Dr Dalbert Batman  - continue once daily dressing changes  -flexiseal placed today to control contamination -continue abx -does not need return to  or  [facility] 02/20/2017

## 2017-02-20 NOTE — Progress Notes (Signed)
Nutrition Consult / Follow-up  DOCUMENTATION CODES:   Morbid obesity  INTERVENTION:    Vital High Protein at 45 ml/h (1080 ml per day)  Pro-stat 30 ml TID   Provides 1380 kcal, 140 gm protein, 903 ml free water daily  NUTRITION DIAGNOSIS:   Increased nutrient needs related to wound healing as evidenced by estimated needs.  Ongoing  GOAL:   Provide needs based on ASPEN/SCCM guidelines  Met with TF  MONITOR:   Vent status, TF tolerance, Labs, I & O's, Skin  REASON FOR ASSESSMENT:   Consult Enteral/tube feeding initiation and management  ASSESSMENT:   41 year old female, PMHx anemia, anxiety/depression, gastric ulcer, HTN, PE and super/morbid obesity. Presented to ED with hypotension, tachycardia and fever. Had few days reported poor PO intake/diarrhea. Found to have septic shock 2/2 enlarging necrotizing wound to buttocks s/p surgical debridement 4/6.   Discussed patient with RN today. Propofol is off, RD to adjust TF to meet nutrition needs. S/P debridement of necrotizing soft tissue infection L gluteal wound 4/9.  Nutrition-Focused physical exam completed. Findings are no fat depletion, no muscle depletion, and mild edema.   Patient is currently intubated on ventilator support Temp (24hrs), Avg:99.2 F (37.3 C), Min:98.9 F (37.2 C), Max:99.5 F (37.5 C)  Propofol: off Labs reviewed: phosphorus 4.9 (H) CBG's: [phone] Medications reviewed and include Lasix and Miralax.  Diet Order:  Diet NPO time specified  Skin:  Wound (see comment) (necrotizing soft tissue infection L gluteal wound)  Last BM:  Unknown  Height:   Ht Readings from Last 1 Encounters:  02/16/17 '5\' 5"'  (1.651 m)    Weight:   Wt Readings from Last 1 Encounters:  02/20/17 (!) 429 lb 14.4 oz (195 kg)    Ideal Body Weight:  56.82 kg  BMI:  Body mass index is 71.54 kg/m.  Estimated Nutritional Needs:   Kcal:  1250-1420  Protein:  142 gm   Fluid:  2 L  EDUCATION NEEDS:    No education needs identified at this time  Molli Barrows, [place], [place], [place] Pager [phone] After Hours Pager [phone]

## 2017-02-20 NOTE — Progress Notes (Addendum)
PULMONARY / CRITICAL CARE MEDICINE   Name: Megan Ortiz MRN: [id number] DOB: [birth year]    ADMISSION DATE:  02/15/2017 CONSULTATION DATE:  02/16/2017  REFERRING MD:  Dr. Oleta Mouse, EDP  CHIEF COMPLAINT:  Sepsis   HISTORY OF PRESENT ILLNESS:   41 year old female with PMH of anemia, anxiety/depression, asthma, gastric ulcer, HTN, PE, and morbid obesity presented 4/5 with hypotension, tachy, febrile to 101.5, leukocytosis at 39,000 and a large necrotizing wound on left buttocks. Required levophed for pressure support. S/p debridement 4/6 and 4/9. On Clinda + Zosyn since 4/5. Culture growing Strep viridans, possible anerobe?   SUBJECTIVE:  Remains off pressors, still intubated but afebrile overnight. Working on weaning currently. Responding and following commands. Leukocytosis improving 39.2 > 54 > 22.7 today.  VITAL SIGNS: BP (!) 124/55   Pulse (!) 101   Temp 98.9 F (37.2 C) (Oral)   Resp (!) 25   Ht 5\' 5"  (1.651 m)   Wt (!) 429 lb 14.4 oz (195 kg) Comment: Weight from yesterday afternoon; scale reading 155 lbs now  LMP 02/01/2017 (Approximate)   SpO2 91%   BMI 71.54 kg/m   HEMODYNAMICS: CVP:  [9 mmHg-10 mmHg] 9 mmHg  VENTILATOR SETTINGS: Vent Mode: PSV;CPAP FiO2 (%):  [40 %-50 %] 50 % Set Rate:  [25 bmp] 25 bmp Vt Set:  [600 mL] 600 mL PEEP:  [8 cmH20] 8 cmH20 Pressure Support:  [10 cmH20] 10 cmH20 Plateau Pressure:  [18 cmH20-27 cmH20] 22 cmH20  INTAKE / OUTPUT: I/O last 3 completed shifts: In: 6634.1 [I.V.:3832.6; Blood:335; NG/GT:1404.1; IV Piggyback:1062.5] Out: 8756 [Urine:3275; Blood:50]  PHYSICAL EXAMINATION: General:  Adult female, NAD, ill appearing . ETT Neuro: Awake and alert. Responds to questions with head mvmnts HEENT:  Normocephalic  Cardiovascular: RRR, no MRG, N S1/S2, whole body non pitting edema Lungs: Distant breath sounds, non-labored Abdomen:  Obese, non-tender, active bowel sounds   Skin:  Warm, dry, open wound to buttocks - not evaluated at this  time due to need for multiple assistance  LABS:  BMET  Recent Labs Lab 02/18/17 0420 02/19/17 0454 02/20/17 0308  NA 139 141 144  K 3.2* 3.2* 3.5  CL 109 110 111  CO2 19* 20* 23  BUN 31* 40* 40*  CREATININE 1.75* 1.70* 1.46*  GLUCOSE 123* 99 107*    Electrolytes  Recent Labs Lab 02/18/17 0420  02/19/17 0454 02/19/17 1615 02/20/17 0308  CALCIUM 8.1*  --  7.8*  --  7.9*  MG 2.0  < > 2.1 2.0 2.1  PHOS 2.8  < > 2.8 4.0 4.9*  < > = values in this interval not displayed.  CBC  Recent Labs Lab 02/18/17 0420 02/19/17 0747 02/19/17 1216 02/20/17 0308  WBC 23.2* 18.6*  --  22.7*  HGB 7.5* 6.9* 7.9* 7.6*  HCT 24.5* 20.8* 24.6* 24.2*  PLT 334 285  --  320   Coag' No results for input(s): APTT, INR in the last 168 hours.  Sepsis Markers  Recent Labs Lab 02/16/17 0037 02/16/17 0502 02/17/17 0339 02/18/17 0000 02/18/17 0420  LATICACIDVEN 1.37  --   --  0.9 1.2  PROCALCITON  --  6.82 5.69  --  3.30    ABG  Recent Labs Lab 02/18/17 0230 02/19/17 0856 02/20/17 0300  PHART 7.340* 7.391 7.366  PCO2ART 34.8 32.7 39.7  PO2ART 79.9* 91.0 82.7*    Liver Enzymes  Recent Labs Lab 02/15/17 1955  AST 33  ALT 19  ALKPHOS 69  BILITOT 0.8  ALBUMIN  3.0*    Cardiac Enzymes  Recent Labs Lab 02/16/17 0502  TROPONINI 0.11*    Glucose  Recent Labs Lab 02/19/17 1153 02/19/17 1550 02/19/17 1959 02/19/17 2328 02/20/17 0313 02/20/17 0836  GLUCAP 98 100* 108* 111* 107* 117*    Imaging Dg Chest Port 1 View  Result Date: 02/20/2017 CLINICAL DATA:  Respiratory failure.  ETT placement. EXAM: PORTABLE CHEST 1 VIEW COMPARISON:  02/18/2017.  Global FINDINGS: Stable cardiomegaly. Stable cardiomediastinal silhouette. Unchanged tubes and lines. BILATERAL pulmonary opacities, RIGHT greater than LEFT without significant improvement. IMPRESSION: Stable chest. Electronically Signed   By: Staci Righter M.D.   On: 02/20/2017 07:07   STUDIES:  CXR 4/5 > Mild  enlargement of the cardiopericardial silhouette, pna vs atelectasis at the left base  CXR 4/8 . Patchy bilateral airspace opacities consistent with multifocal pneumonia vs asymmetric pulm edema CXR 4/10 > Stable. BL pulmonary opacities, R>L  CULTURES: Blood 4/5 > NGTD Wound 4/5 > strep viridans  ANTIBIOTICS: Clindamycin 4/5 > Zosyn 4/5 > Vancomycin 4/5 x1, d/c  SIGNIFICANT EVENTS: 4/5 > Presents to ED > sepsis secondary to necrotizing wound  4/6 > OR debridement of wound > remained intubated as plan to go back to OR > 3 pressors; no A line was able to be obtained x7 attempts 4/9 > OR debridement of wound 4/10 > attempt weaning  LINES/TUBES: ETT 4/6 CVC 4/6 RIJ  DISCUSSION: 41 year old morbidly obese female presents to ED with septic shock secondary to necrotizing buttock wound. Transfered to [facility] for surgical debridement. Now s/p 2 debridements, weaning off vent. Culture + strep viridans.   ASSESSMENT / PLAN:  PULMONARY A: Acute hypoxemic hypercapnic respiratory failure secondary to sepsis + unable to protect airway + HCAP +/- Pulm edema H/O Asthma P:   Working on weaning today. Was kept on vent due to need for further surg.  Acidosis resolved today Maintain Oxygen >90 Pulmonary Hygiene  Neb meds with pulmicort and Atrovent  CARDIOVASCULAR A:  Septic shock 2/2 necrotizing fasciitis. Weaned off of pressors > 24 hrs H/O HTN, PE P:  Cardiac Monitoring  Maintain MAP >65 Hold home Cardizem, Lisinopril, Spironolactone    RENAL A:   Acute Kidney Injury - stable and slowly improving. Cr 1.4 today, 2.4 on admission No longer acidotic K 3.5, mag 2.1 P:   Trend BMP Replace electrolytes as needed  D/c Bicarbonate infusion as acidosis resolved  GASTROINTESTINAL A:   Diarrhea; no further since admission. Actually, no BM since admission. P:   Tube feeds Start bowel regimen Place rectal tube given pts super morbid obesity, proximity of large wound to rectum.    HEMATOLOGIC A:   Anemia  Hgb 7.6 today s/p transfusion 1 unit yest.  P:  Trend CBC Transfuse for Hbg <7  INFECTIOUS A:   Septic Shock secondary to necrotizing wound of buttocks. Streptococcus viridans in culture.  S/P debridement of buttocks on 4/6, 4/9 Pct 3.3, LA 12 P:   Cont Zosyn, and Clindamycin  D/c vanc Plan for repeat debridement today.   ENDOCRINE A:   No issues    P:   Trend Glucose and SSI   NEUROLOGIC A:   Acute Pain  H/O Anxiety/Depression  P:   RASS goal: 0 Fentanyl drip and Versed pushes.  Alphonzo Grieve, MD IMTS - PGY1   STAFF NOTE: I, Merrie Roof, MD FACP have personally reviewed patient's available data, including medical history, events of note, physical examination and test results as part of my evaluation.  I have discussed with resident/NP and other care providers such as pharmacist, RN and RRT. In addition, I personally evaluated patient and elicited key findings of: awakens and follows commands, wound dressed, abdo soft, Lung sounds anterior clear, fine wet crackles bases, Bp is better over all, assume now toxins are not present - dc clinda, maintain zosyn, bicarb improved, dc bicarb drip, no OR trips expected further , cvp 9, edema , pos balance, likely pulm edema a component to weaning , lasix start, kvo, renal fxn noted likely is ARF ATN, PS 10-12, cpap 8 neede weaning, hope with start of diuresis can improve and reduce needs weaning, I updated pt and husband at bedside in full, repeat pcxr, lytes crt in am on lasix The patient is critically ill with multiple organ systems failure and requires high complexity decision making for assessment and support, frequent evaluation and titration of therapies, application of advanced monitoring technologies and extensive interpretation of multiple databases.   Critical Care Time devoted to patient care services described in this note is 30 Minutes. This time reflects time of care of this signee:  Merrie Roof, MD FACP. This critical care time does not reflect procedure time, or teaching time or supervisory time of PA/NP/Med student/Med Resident etc but could involve care discussion time. Rest per NP/medical resident whose note is outlined above and that I agree with   Lavon Paganini. Titus Mould, MD, [place] Pgr: [facility] 02/20/2017 3:37 PM

## 2017-02-21 ENCOUNTER — Inpatient Hospital Stay (HOSPITAL_COMMUNITY): Payer: Self-pay

## 2017-02-21 LAB — BASIC METABOLIC PANEL
ANION GAP: 8 (ref 5–15)
Anion gap: 10 (ref 5–15)
BUN: 34 mg/dL — AB (ref 6–20)
BUN: 32 mg/dL — AB (ref 6–20)
CALCIUM: 8.3 mg/dL — AB (ref 8.9–10.3)
CHLORIDE: 110 mmol/L (ref 101–111)
CO2: 28 mmol/L (ref 22–32)
CO2: 31 mmol/L (ref 22–32)
CREATININE: 1.25 mg/dL — AB (ref 0.44–1.00)
Calcium: 8.5 mg/dL — ABNORMAL LOW (ref 8.9–10.3)
Chloride: 110 mmol/L (ref 101–111)
Creatinine, Ser: 1.27 mg/dL — ABNORMAL HIGH (ref 0.44–1.00)
GFR calc Af Amer: 60 mL/min — ABNORMAL LOW (ref >60)
GFR calc Af Amer: >60 mL/min (ref >60)
GFR calc non Af Amer: 52 mL/min — ABNORMAL LOW (ref >60)
GFR calc non Af Amer: 53 mL/min — ABNORMAL LOW (ref >60)
GLUCOSE: 118 mg/dL — AB (ref 65–99)
Glucose, Bld: 118 mg/dL — ABNORMAL HIGH (ref 65–99)
POTASSIUM: 2.8 mmol/L — AB (ref 3.5–5.1)
Potassium: 2.6 mmol/L — CL (ref 3.5–5.1)
SODIUM: 146 mmol/L — AB (ref 135–145)
Sodium: 151 mmol/L — ABNORMAL HIGH (ref 135–145)

## 2017-02-21 LAB — CBC
HCT: 25.2 % — ABNORMAL LOW (ref 36.0–46.0)
Hemoglobin: 7.9 g/dL — ABNORMAL LOW (ref 12.0–15.0)
MCH: 27 pg (ref 26.0–34.0)
MCHC: 31.3 g/dL (ref 30.0–36.0)
MCV: 86 fL (ref 78.0–100.0)
PLATELETS: 351 10*3/uL (ref 150–400)
RBC: 2.93 MIL/uL — AB (ref 3.87–5.11)
RDW: 17.5 % — ABNORMAL HIGH (ref 11.5–15.5)
WBC: 24.1 10*3/uL — AB (ref 4.0–10.5)

## 2017-02-21 LAB — PHOSPHORUS: Phosphorus: 3.3 mg/dL (ref 2.5–4.6)

## 2017-02-21 LAB — MAGNESIUM: MAGNESIUM: 1.8 mg/dL (ref 1.7–2.4)

## 2017-02-21 LAB — GLUCOSE, CAPILLARY
GLUCOSE-CAPILLARY: 133 mg/dL — AB (ref 65–99)
GLUCOSE-CAPILLARY: 111 mg/dL — AB (ref 65–99)
Glucose-Capillary: 112 mg/dL — ABNORMAL HIGH (ref 65–99)
Glucose-Capillary: 119 mg/dL — ABNORMAL HIGH (ref 65–99)
Glucose-Capillary: 94 mg/dL (ref 65–99)

## 2017-02-21 LAB — AEROBIC/ANAEROBIC CULTURE W GRAM STAIN (SURGICAL/DEEP WOUND)

## 2017-02-21 LAB — AEROBIC/ANAEROBIC CULTURE (SURGICAL/DEEP WOUND)

## 2017-02-21 MED ORDER — SODIUM CHLORIDE 0.9 % IV SOLN
30.0000 meq | INTRAVENOUS | Status: AC
  Administered 2017-02-21 (×2): 30 meq via INTRAVENOUS
  Filled 2017-02-21 (×2): qty 15

## 2017-02-21 MED ORDER — CHLORHEXIDINE GLUCONATE CLOTH 2 % EX PADS
6.0000 | MEDICATED_PAD | Freq: Every day | CUTANEOUS | Status: DC
  Administered 2017-02-21 – 2017-03-02 (×5): 6 via TOPICAL

## 2017-02-21 MED ORDER — POTASSIUM CHLORIDE 20 MEQ/15ML (10%) PO SOLN
40.0000 meq | Freq: Two times a day (BID) | ORAL | Status: DC
  Administered 2017-02-21 – 2017-02-23 (×4): 40 meq via ORAL
  Filled 2017-02-21 (×6): qty 30

## 2017-02-21 MED ORDER — SODIUM CHLORIDE 0.9 % IV SOLN
30.0000 meq | Freq: Once | INTRAVENOUS | Status: AC
  Administered 2017-02-21: 30 meq via INTRAVENOUS
  Filled 2017-02-21: qty 15

## 2017-02-21 MED ORDER — DEXTROSE 5 % IV SOLN
INTRAVENOUS | Status: DC
  Administered 2017-02-21 – 2017-02-24 (×4): via INTRAVENOUS

## 2017-02-21 MED ORDER — FENTANYL CITRATE (PF) 100 MCG/2ML IJ SOLN
25.0000 ug | INTRAMUSCULAR | Status: DC | PRN
  Administered 2017-02-21 – 2017-02-26 (×34): 25 ug via INTRAVENOUS
  Filled 2017-02-21 (×36): qty 2

## 2017-02-21 NOTE — Progress Notes (Signed)
2 Days Post-Op  Subjective: intubated  Objective: Vital signs in last 24 hours: Temp:  [99 F (37.2 C)-101.1 F (38.4 C)] 100.8 F (38.2 C) (04/11 1124) Pulse Rate:  [98-126] 114 (04/11 1100) Resp:  [12-28] 19 (04/11 1100) BP: (103-142)/(42-83) 120/62 (04/11 1100) SpO2:  [92 %-99 %] 92 % (04/11 1341) FiO2 (%):  [40 %-50 %] 40 % (04/11 1025) Weight:  [216.8 kg (478 lb)-228.2 kg (503 lb)] 216.8 kg (478 lb) (04/11 0330) Last BM Date: 02/21/17  Intake/Output from previous day: 04/10 0701 - 04/11 0700 In: 2826.6 [I.V.:840; NG/GT:1736.6; IV Piggyback:250] Out: 11760 [Urine:10510; Emesis/NG output:1250] Intake/Output this shift: Total I/O In: 463.4 [I.V.:133.4; NG/GT:330] Out: 2350 [Urine:2350]  Wound stable, without infection, dressing changed  Lab Results:   Recent Labs  02/20/17 0308 02/21/17 0342  WBC 22.7* 24.1*  HGB 7.6* 7.9*  HCT 24.2* 25.2*  PLT 320 351   BMET  Recent Labs  02/20/17 0308 02/21/17 0342  NA 144 146*  K 3.5 2.6*  CL 111 110  CO2 23 28  GLUCOSE 107* 118*  BUN 40* 34*  CREATININE 1.46* 1.27*  CALCIUM 7.9* 8.3*   PT/INR No results for input(s): LABPROT, INR in the last 72 hours. ABG  Recent Labs  02/19/17 0856 02/20/17 0300  PHART 7.391 7.366  HCO3 19.8* 22.2    Studies/Results: Dg Chest Port 1 View  Result Date: 02/21/2017 CLINICAL DATA:  41 year old female status post surgical debridement of necrotizing soft tissue infection of the left gluteus. Sepsis. Remains intubated, attempted weaning yesterday. EXAM: PORTABLE CHEST 1 VIEW COMPARISON:  02/20/2017 and earlier. FINDINGS: Portable AP semi upright view at 0836 hours. Endotracheal tube tip projects between the clavicles and carina. Stable right IJ central line and visible enteric tube which courses to the abdomen. The patient is more rotated to the right. Patchy and confluent bibasilar opacity greater on the right, probably not significantly changed since yesterday allowing for  rotation. No pneumothorax. No pulmonary edema. Possible small right pleural effusion. Stable cardiac size and mediastinal contours. IMPRESSION: 1.  Stable lines and tubes. 2. Stable patchy lung base opacity suspicious for pneumonia. Possible small right pleural effusion. Electronically Signed   By: Genevie Ann M.D.   On: 02/21/2017 08:52   Dg Chest Port 1 View  Result Date: 02/20/2017 CLINICAL DATA:  Respiratory failure.  ETT placement. EXAM: PORTABLE CHEST 1 VIEW COMPARISON:  02/18/2017.  Global FINDINGS: Stable cardiomegaly. Stable cardiomediastinal silhouette. Unchanged tubes and lines. BILATERAL pulmonary opacities, RIGHT greater than LEFT without significant improvement. IMPRESSION: Stable chest. Electronically Signed   By: Staci Righter M.D.   On: 02/20/2017 07:07    Anti-infectives: Anti-infectives    Start     Dose/Rate Route Frequency Ordered Stop   02/16/17 2200  clindamycin (CLEOCIN) IVPB 900 mg  Status:  Discontinued     900 mg 100 mL/hr over 30 Minutes Intravenous Every 8 hours 02/16/17 1418 02/20/17 0959   02/16/17 0600  clindamycin (CLEOCIN) IVPB 600 mg  Status:  Discontinued     600 mg 100 mL/hr over 30 Minutes Intravenous Every 8 hours 02/16/17 0352 02/16/17 1418   02/16/17 0400  piperacillin-tazobactam (ZOSYN) IVPB 3.375 g     3.375 g 12.5 mL/hr over 240 Minutes Intravenous Every 8 hours 02/16/17 0400     02/16/17 0400  vancomycin (VANCOCIN) 2,000 mg in sodium chloride 0.9 % 500 mL IVPB  Status:  Discontinued     2,000 mg 250 mL/hr over 120 Minutes Intravenous Every 24 hours 02/16/17  0400 02/19/17 0917   02/15/17 2145  clindamycin (CLEOCIN) IVPB 600 mg     600 mg 100 mL/hr over 30 Minutes Intravenous  Once 02/15/17 2140 02/15/17 2347   02/15/17 2000  piperacillin-tazobactam (ZOSYN) IVPB 3.375 g     3.375 g 100 mL/hr over 30 Minutes Intravenous  Once 02/15/17 1955 02/15/17 2131   02/15/17 2000  vancomycin (VANCOCIN) IVPB 1000 mg/200 mL premix     1,000 mg 200 mL/hr over 60  Minutes Intravenous  Once 02/15/17 1955 02/15/17 2348      Assessment/Plan: POD 5/2 debridement of gluteal wound/dressing change under anesthesia- Dr Dalbert Batman  - continue once daily dressing changes  -flexiseal intact -continue abx -does not need return to or  [facility] 02/21/2017

## 2017-02-21 NOTE — Progress Notes (Signed)
HME and ballard changed

## 2017-02-21 NOTE — Progress Notes (Addendum)
PULMONARY / CRITICAL CARE MEDICINE   Name: Megan Ortiz MRN: [id number] DOB: [birth year]    ADMISSION DATE:  02/15/2017 CONSULTATION DATE:  02/16/2017  REFERRING MD:  Dr. Oleta Mouse, EDP  CHIEF COMPLAINT:  Sepsis   HISTORY OF PRESENT ILLNESS:   41 year old female with PMH of anemia, anxiety/depression, asthma, gastric ulcer, HTN, PE, and morbid obesity presented 4/5 with hypotension, tachy, febrile to 101.5, leukocytosis at 39,000 and a large necrotizing wound on left buttocks. Required levophed for pressure support. S/p debridement 4/6 and 4/9. On Clinda + Zosyn since 4/5, narrowed to just Zosyn 4/10. Culture growing Strep viridans with mixed anerobic flora.   SUBJECTIVE/INTERVAL HISTORY: Remains off pressors. Remains intubated however attempted weaning yesterday. Pt reportedly agitated overnight and sedation was resumed. Working on Careers adviser today.  Diuresed well with addition of lasix yesterday, out 8.7 L yest for a total of +13 L since admission.  -Propofol  -Fentanyl -Vent -Zosyn -TF K 2.6  VITAL SIGNS: BP (!) 116/53   Pulse (!) 111   Temp 100.3 F (37.9 C) (Oral)   Resp (!) 25   Ht 5\' 5"  (1.651 m)   Wt (!) 478 lb (216.8 kg)   LMP 02/01/2017 (Approximate)   SpO2 95%   BMI 79.54 kg/m   VENTILATOR SETTINGS: Vent Mode: PRVC FiO2 (%):  [40 %-50 %] 50 % Set Rate:  [25 bmp] 25 bmp Vt Set:  [600 mL] 600 mL PEEP:  [8 cmH20] 8 cmH20 Pressure Support:  [10 cmH20] 10 cmH20 Plateau Pressure:  [24 cmH20-28 cmH20] 24 cmH20  INTAKE / OUTPUT: I/O last 3 completed shifts: In: 4518.5 [I.V.:1931.9; NG/GT:2036.6; IV Piggyback:550] Out: 78295 [AOZHY:86578; Emesis/NG output:1250]  PHYSICAL EXAMINATION: General:  Adult female, NAD, ill appearing . ETT Neuro: Sedate but arouses to verbal stimuli. Responds to questions with head mvmnts HEENT:  Normocephalic, +ETT, +NG Cardiovascular: RRR, no MRG, N S1/S2, pitting edema BL LE Lungs: Distant breath sounds, non-labored. Some mild coarse breath  sounds. Abdomen:  Obese, non-tender, active bowel sounds . Rectal tube with stool. Skin:  Warm, dry, open wound to buttocks - not evaluated at this time due to need for multiple assistance  LABS:  BMET  Recent Labs Lab 02/19/17 0454 02/20/17 0308 02/21/17 0342  NA 141 144 146*  K 3.2* 3.5 2.6*  CL 110 111 110  CO2 20* 23 28  BUN 40* 40* 34*  CREATININE 1.70* 1.46* 1.27*  GLUCOSE 99 107* 118*   Electrolytes  Recent Labs Lab 02/19/17 0454 02/19/17 1615 02/20/17 0308 02/21/17 0342  CALCIUM 7.8*  --  7.9* 8.3*  MG 2.1 2.0 2.1 1.8  PHOS 2.8 4.0 4.9* 3.3   CBC  Recent Labs Lab 02/19/17 0747 02/19/17 1216 02/20/17 0308 02/21/17 0342  WBC 18.6*  --  22.7* 24.1*  HGB 6.9* 7.9* 7.6* 7.9*  HCT 20.8* 24.6* 24.2* 25.2*  PLT 285  --  320 351   Coag' No results for input(s): APTT, INR in the last 168 hours.  Sepsis Markers  Recent Labs Lab 02/16/17 0037 02/16/17 0502 02/17/17 0339 02/18/17 0000 02/18/17 0420  LATICACIDVEN 1.37  --   --  0.9 1.2  PROCALCITON  --  6.82 5.69  --  3.30    ABG  Recent Labs Lab 02/18/17 0230 02/19/17 0856 02/20/17 0300  PHART 7.340* 7.391 7.366  PCO2ART 34.8 32.7 39.7  PO2ART 79.9* 91.0 82.7*    Liver Enzymes  Recent Labs Lab 02/15/17 1955  AST 33  ALT 19  ALKPHOS 69  BILITOT 0.8  ALBUMIN 3.0*    Cardiac Enzymes  Recent Labs Lab 02/16/17 0502  TROPONINI 0.11*    Glucose  Recent Labs Lab 02/20/17 0313 02/20/17 0836 02/20/17 1217 02/20/17 1617 02/20/17 1941 02/20/17 2335  GLUCAP 107* 117* 116* 124* 119* 112*    Imaging No results found. STUDIES:  CXR 4/5 > Mild enlargement of the cardiopericardial silhouette, pna vs atelectasis at the left base  CXR 4/8 . Patchy bilateral airspace opacities consistent with multifocal pneumonia vs asymmetric pulm edema CXR 4/10 > Stable. BL pulmonary opacities, R>L  CULTURES: Blood 4/5 > NGTD Wound 4/5 > strep viridans + mixed  anerobes  ANTIBIOTICS: Clindamycin 4/5 >4/10 Zosyn 4/5 > Vancomycin 4/5 x1, d/c  SIGNIFICANT EVENTS: 4/5 > Presents to ED > sepsis secondary to necrotizing wound  4/6 > OR debridement of wound > remained intubated as plan to go back to OR > 3 pressors; no A line was able to be obtained x7 attempts 4/9 > OR debridement of wound 4/10 > attempt weaning  LINES/TUBES: ETT 4/6 CVC 4/6 RIJ  DISCUSSION: 41 year old morbidly obese female presents to ED with septic shock secondary to necrotizing buttock wound. Transfered to [facility] for surgical debridement. Now s/p 2 debridements, weaning off vent. Culture + strep viridans + mixed anerobes  ASSESSMENT / PLAN:  PULMONARY A: Acute hypoxemic hypercapnic respiratory failure secondary to sepsis + unable to protect airway + HCAP +/- Pulm edema H/O Asthma  P:   Working on weaning again today. Acidosis resolved Maintain Oxygen >90 Pulmonary Hygiene  Neb meds with pulmicort and Atrovent Zosyn  CARDIOVASCULAR A:  Septic shock 2/2 necrotizing fasciitis. Weaned off of pressors > 48 hrs H/O HTN, PE P:  Cardiac Monitoring  Maintain MAP >65 Hold home Cardizem, Lisinopril, Spironolactone    RENAL A:   Acute Kidney Injury - stable and slowly improving. Cr 1.2 today, 2.4 on admission No longer acidotic, bicarb infusion d/c 4/10 K 2.6, mag 1.8 P:   Trend BMP Replace electrolytes as needed  -44mEq K this AM, then repeat BMET this afternoon. Replace again as needed Holding AM lasix in setting of hypokalemia  GASTROINTESTINAL A:   Rectal tube in place 2/2 patients body habitus, proximity of large wound to rectum and also inability to ambulate currently.  P:   Tube feeds Bowel regimen Place rectal tube given pts super morbid obesity, proximity of large wound to rectum.   HEMATOLOGIC A:   Anemia  Hgb 7.9 today. Transfusion 1 unit 4/9 Leukocytosis 54 > 39 > 23 > 18 > 23> 24 P:  Trend CBC Transfuse for Hbg <7  INFECTIOUS A:    Septic Shock secondary to necrotizing wound of buttocks. Streptococcus viridans + mixed anerobes in culture.  S/P debridement of buttocks on 4/6, 4/9 Febrile to 100.3, leukocytosis 24.  Blood cultures NGTD @ 5 days P:   Cont Zosyn, has broad coverage against strep and anerobes No plans for further debridement at this time  ENDOCRINE A:  No issues    P:   Trend Glucose and SSI  NEUROLOGIC A:   Acute Pain  H/O Anxiety/Depression  P:   RASS goal: 0 Fentanyl, propofol      ATTENDING NOTE: I have personally reviewed patient's available data, including medical history, events of note, physical examination and test results as part of my evaluation. I have discussed with resident/NP and other careteam providers such as pharmacist, RN and RRT &co-ordinated with consultants. In addition, I personally evaluated patient  and elicited key history of   41 year old morbidly obese woman admitted with necrotizing fasciitis of wound on her left buttock, septic shock and AKI transferred to [facility] for surgical debridement.  O/E-  sedated, off pressors, S1-S2 normal, soft nontender abdomen, 1+ bipedal edema, buttock wound as characterized by surgery She diuresed 8 L with Lasix  Labs- lactate resolved,  decreased leukocytosis, creatinine improving to 1.3-  0.6 in 2014, severe hypokalemia 2.6 Chest x-ray-left lower lobe atelectasis/soft tissue shadow Impression/plan  Septic shock- resolved Empiric Zosyn and clindamycin for cellulitis/necrotizing fasciitis  Acute respiratory failure- ventilator settings were reviewed and adjusted, start SBTs, keep PEEP at 8 AKI - resolving, severe hypokalemia will be repleted aggressively before more Lasix was given, goal is negative balance 2 L in the next 24 hours  Propofol and Fent note can be continued for sedation Rest per medical resident whose note is outlined above and that I agree with and edited in full.   The patient is critically ill with  multiple organ systems failure and requires high complexity decision making for assessment and support, frequent evaluation and titration of therapies, application of advanced monitoring technologies and extensive interpretation of multiple databases. Critical Care Time devoted to patient care services described in this note independent of resident time is 32 minutes.   Rigoberto Noel MD

## 2017-02-21 NOTE — Progress Notes (Signed)
CRITICAL VALUE ALERT  Critical value received:  K 2.6  Date of notification:  02/21/2017  Time of notification:  04:35  Critical value read back:Yes.    Nurse who received alert:  BShepherd, RN  MD notified (1st page):  Posey Pronto, [place].  Time of first page:  04:37  MD notified (2nd page):  Time of second page:  Responding MD:  Posey Pronto, Alfonso Patten.  Time MD responded:  04:44

## 2017-02-21 NOTE — Care Management Note (Signed)
Case Management Note  Patient Details  Name: Megan Ortiz MRN: [id number] Date of Birth: [birth year]  Subjective/Objective: Pt admitted with necrotizing soft tissue infection                   Action/Plan:   PTA from home with husband.  Pt was extubated today- remains on tube feeds, IV antibiotics, IV protonix and IV lasix.  CM will continue to follow for discharge needs  Expected Discharge Date:                  Expected Discharge Plan:     In-House Referral:  Clinical Social Work  Discharge planning Services  CM Consult  Post Acute Care Choice:    Choice offered to:     DME Arranged:    DME Agency:     HH Arranged:    [place] Agency:     Status of Service:  In process, will continue to follow  If discussed at Long Length of Stay Meetings, dates discussed:    Additional Comments:  Maryclare Labrador, RN 02/21/2017, 2:23 PM

## 2017-02-21 NOTE — Anesthesia Postprocedure Evaluation (Addendum)
Anesthesia Post Note  Patient: Megan Ortiz  Procedure(s) Performed: Procedure(s) (LRB): DEBRIDEMENT OF GLUTEAL WOUND (Left)  Patient location during evaluation: SICU Anesthesia Type: General Level of consciousness: sedated and patient remains intubated per anesthesia plan Pain management: pain level controlled Vital Signs Assessment: post-procedure vital signs reviewed and stable Respiratory status: patient remains intubated per anesthesia plan Cardiovascular status: stable Anesthetic complications: no       Last Vitals:  Vitals:   02/21/17 0900 02/21/17 1000  BP: (!) 107/54 128/63  Pulse: (!) 109 (!) 113  Resp: (!) 25 17  Temp:      Last Pain:  Vitals:   02/21/17 0849  TempSrc: Oral  PainSc:                  Carmine Carrozza,JAMES TERRILL

## 2017-02-21 NOTE — Progress Notes (Signed)
Fentanytl gtt , 75 mls wasted with Myrla Halsted, RN.

## 2017-02-21 NOTE — Progress Notes (Signed)
[place] Progress Note Patient Name: Megan Ortiz DOB: [birth year] MRN: [id number]   Date of Service  02/21/2017  HPI/Events of Note  Na up k low  eICU Interventions  d5w k     Intervention Category Major Interventions: Electrolyte abnormality - evaluation and management  Raylene Miyamoto. 02/21/2017, 7:24 PM

## 2017-02-21 NOTE — Procedures (Signed)
Extubation Procedure Note  Patient Details:   Name: Megan Ortiz DOB: [birth year] MRN: [id number]   Airway Documentation:     Evaluation  O2 sats: stable throughout Complications: No apparent complications Patient did tolerate procedure well. Bilateral Breath Sounds: Clear, Diminished   Yes  Placed on 6l/min [place], sp02 91-92% Incentive spirometer instructed 1.3L  Revonda Standard 02/21/2017, 12:41 PM

## 2017-02-21 NOTE — Progress Notes (Signed)
CSW engaged with Patient's husband regarding his questions about the disability process as per CSW consult. Per Patient's husband, Patient has been denied in the past and just appealed about 2 weeks ago and he hasn't heard anything back. CSW reports that it can take a few months for the appeal to be processed and that he should contact the [facility] for further updates. CSW encouraged Patient's husband to make sure that he receives release of information so that Disability can obtain information from the hospital and any other medical facilities. Patient's husband presently at dialysis and will not be back to hospital until later this evening. CSW has left information regarding disability process with RN to give to Patient's husband. CSW continues to follow.    Lorrine Kin, MSW, LCSW [facility] ED/20M Clinical Social Worker [phone]

## 2017-02-21 NOTE — Progress Notes (Signed)
[place] Progress Note Patient Name: Megan Ortiz DOB: 02-10-1976 MRN: [id number]   Date of Service  02/21/2017  HPI/Events of Note  Wet cough still  Ice chips okay Get SLP  eICU Interventions       Intervention Category Minor Interventions: Clinical assessment - ordering diagnostic tests  Raylene Miyamoto. 02/21/2017, 4:43 PM

## 2017-02-21 NOTE — Progress Notes (Signed)
Patient with Potassium of 2.8., sodium 151.  Awaiting callback from [facility] to notify MD of results. Will monitor.

## 2017-02-22 LAB — BASIC METABOLIC PANEL
ANION GAP: 8 (ref 5–15)
BUN: 25 mg/dL — AB (ref 6–20)
CALCIUM: 8.2 mg/dL — AB (ref 8.9–10.3)
CO2: 32 mmol/L (ref 22–32)
Chloride: 111 mmol/L (ref 101–111)
Creatinine, Ser: 1.21 mg/dL — ABNORMAL HIGH (ref 0.44–1.00)
GFR calc Af Amer: >60 mL/min (ref >60)
GFR, EST NON AFRICAN AMERICAN: 55 mL/min — AB (ref >60)
Glucose, Bld: 98 mg/dL (ref 65–99)
Potassium: 2.9 mmol/L — ABNORMAL LOW (ref 3.5–5.1)
SODIUM: 151 mmol/L — AB (ref 135–145)

## 2017-02-22 LAB — GLUCOSE, CAPILLARY
GLUCOSE-CAPILLARY: 92 mg/dL (ref 65–99)
GLUCOSE-CAPILLARY: 127 mg/dL — AB (ref 65–99)
GLUCOSE-CAPILLARY: 128 mg/dL — AB (ref 65–99)
GLUCOSE-CAPILLARY: 120 mg/dL — AB (ref 65–99)
Glucose-Capillary: 107 mg/dL — ABNORMAL HIGH (ref 65–99)

## 2017-02-22 LAB — CBC
HCT: 25.2 % — ABNORMAL LOW (ref 36.0–46.0)
Hemoglobin: 7.6 g/dL — ABNORMAL LOW (ref 12.0–15.0)
MCH: 26.6 pg (ref 26.0–34.0)
MCHC: 30.2 g/dL (ref 30.0–36.0)
MCV: 88.1 fL (ref 78.0–100.0)
PLATELETS: 355 10*3/uL (ref 150–400)
RBC: 2.86 MIL/uL — ABNORMAL LOW (ref 3.87–5.11)
RDW: 17.4 % — AB (ref 11.5–15.5)
WBC: 23.3 10*3/uL — AB (ref 4.0–10.5)

## 2017-02-22 MED ORDER — PANTOPRAZOLE SODIUM 40 MG IV SOLR
40.0000 mg | Freq: Every day | INTRAVENOUS | Status: DC
  Administered 2017-02-22 – 2017-02-24 (×3): 40 mg via INTRAVENOUS
  Filled 2017-02-22 (×4): qty 40

## 2017-02-22 MED ORDER — SODIUM CHLORIDE 0.9 % IV SOLN
3.0000 g | Freq: Three times a day (TID) | INTRAVENOUS | Status: DC
  Administered 2017-02-22 – 2017-02-23 (×2): 3 g via INTRAVENOUS
  Filled 2017-02-22 (×5): qty 3

## 2017-02-22 NOTE — Progress Notes (Signed)
Patient transferred to [facility] room 1.  Report given to Faith Rogue, RN.  Pt's husband at bedside.  Will monitor.

## 2017-02-22 NOTE — Progress Notes (Signed)
Pharmacy Antibiotic Consult Note  Megan Ortiz is a 41 y.o. female admitted on 02/15/2017 with sepsis w/ presumed necrotizing fasciitis.  Pharmacy has been consulted for Zosyn dosing, being transitioned to Unasyn. Patient is currently afebrile (Tmax 101.1 in the last 24 hours) with elevated wbc (23.3). Renal function improving, today's Scr 1.21 (improved) correlating to a nCrCl ~70 ml/min. Per discussions with Drs. McQuaid and Molt, Zosyn will be de-escalated to Unasyn.   Plan: Discontinue Zosyn  Unasyn 3 grams IV every 8 hours Monitor renal function, culture results, and clinical improvement Follow-up length of therapy   Height: 5\' 5"  (165.1 cm) Weight: (!) 479 lb (217.3 kg) IBW/kg (Calculated) : 57  Temp (24hrs), Avg:98.6 F (37 C), Min:97.8 F (36.6 C), Max:99.7 F (37.6 C)   Recent Labs Lab 02/15/17 2005 02/15/17 2033 02/16/17 0037  02/18/17 0000 02/18/17 0420 02/19/17 0454 02/19/17 0747 02/20/17 0308 02/21/17 0342 02/21/17 1400 02/22/17 0315  WBC  --   --   --   < >  --  23.2*  --  18.6* 22.7* 24.1*  --  23.3*  CREATININE  --   --   --   < >  --  1.75* 1.70*  --  1.46* 1.27* 1.25* 1.21*  LATICACIDVEN 4.69* 2.6* 1.37  --  0.9 1.2  --   --   --   --   --   --   VANCOTROUGH  --   --   --   --   --   --  25*  --   --   --   --   --   < > = values in this interval not displayed.  Estimated Creatinine Clearance: 117 mL/min (A) (by C-G formula based on SCr of 1.21 mg/dL (H)).    Allergies  Allergen Reactions  . Aspirin     Due to hx of stomach ulcers    Antimicrobials this admission:  4/5 vanc >> 4/9 4/5 zosyn >> 4/12 4/5 clinda>> 4/10 4/12 unasyn >>  Dose adjustments this admission:  4/6 Clindamycin increased to 900 mg TID d/t body habitus, deep tissue involvement 4/9 VT 25 (appropriately drawn) - vanc dc today  Microbiology results:  4/6 MRSA PCR: negative 4/5 BCx x2: ngtd 4/6 wound Cxr: viridans strep, mixed anaerobe coverage  Thank you for allowing  pharmacy to be a part of this patient's care.  Belia Heman, PharmD PGY1 Pharmacy Resident [phone] (Pager) 02/22/2017 11:39 AM

## 2017-02-22 NOTE — Progress Notes (Signed)
3 Days Post-Op  Subjective: extubated  Objective: Vital signs in last 24 hours: Temp:  [97.8 F (36.6 C)-99.7 F (37.6 C)] 99.7 F (37.6 C) (04/12 0835) Pulse Rate:  [87-110] 89 (04/12 0900) Resp:  [13-36] 25 (04/12 0900) BP: (85-133)/(36-86) 133/60 (04/12 0900) SpO2:  [86 %-99 %] 94 % (04/12 0900) Weight:  [217.3 kg (479 lb)] 217.3 kg (479 lb) (04/12 0500) Last BM Date: 02/21/17  Intake/Output from previous day: 04/11 0701 - 04/12 0700 In: 1590.7 [P.O.:90; I.V.:453.4; NG/GT:330; IV Piggyback:717.3] Out: 9450 [TUUEK:8003; Stool:900] Intake/Output this shift: Total I/O In: 172.5 [P.O.:80; I.V.:80; IV Piggyback:12.5] Out: 1400 [Urine:1400]  Incision/Wound:  Lab Results:   Recent Labs  02/21/17 0342 02/22/17 0315  WBC 24.1* 23.3*  HGB 7.9* 7.6*  HCT 25.2* 25.2*  PLT 351 355   BMET  Recent Labs  02/21/17 1400 02/22/17 0315  NA 151* 151*  K 2.8* 2.9*  CL 110 111  CO2 31 32  GLUCOSE 118* 98  BUN 32* 25*  CREATININE 1.25* 1.21*  CALCIUM 8.5* 8.2*   PT/INR No results for input(s): LABPROT, INR in the last 72 hours. ABG  Recent Labs  02/20/17 0300  PHART 7.366  HCO3 22.2    Studies/Results: Dg Chest Port 1 View  Result Date: 02/21/2017 CLINICAL DATA:  41 year old female status post surgical debridement of necrotizing soft tissue infection of the left gluteus. Sepsis. Remains intubated, attempted weaning yesterday. EXAM: PORTABLE CHEST 1 VIEW COMPARISON:  02/20/2017 and earlier. FINDINGS: Portable AP semi upright view at 0836 hours. Endotracheal tube tip projects between the clavicles and carina. Stable right IJ central line and visible enteric tube which courses to the abdomen. The patient is more rotated to the right. Patchy and confluent bibasilar opacity greater on the right, probably not significantly changed since yesterday allowing for rotation. No pneumothorax. No pulmonary edema. Possible small right pleural effusion. Stable cardiac size and  mediastinal contours. IMPRESSION: 1.  Stable lines and tubes. 2. Stable patchy lung base opacity suspicious for pneumonia. Possible small right pleural effusion. Electronically Signed   By: Genevie Ann M.D.   On: 02/21/2017 08:52    Anti-infectives: Anti-infectives    Start     Dose/Rate Route Frequency Ordered Stop   02/16/17 2200  clindamycin (CLEOCIN) IVPB 900 mg  Status:  Discontinued     900 mg 100 mL/hr over 30 Minutes Intravenous Every 8 hours 02/16/17 1418 02/20/17 0959   02/16/17 0600  clindamycin (CLEOCIN) IVPB 600 mg  Status:  Discontinued     600 mg 100 mL/hr over 30 Minutes Intravenous Every 8 hours 02/16/17 0352 02/16/17 1418   02/16/17 0400  piperacillin-tazobactam (ZOSYN) IVPB 3.375 g     3.375 g 12.5 mL/hr over 240 Minutes Intravenous Every 8 hours 02/16/17 0400     02/16/17 0400  vancomycin (VANCOCIN) 2,000 mg in sodium chloride 0.9 % 500 mL IVPB  Status:  Discontinued     2,000 mg 250 mL/hr over 120 Minutes Intravenous Every 24 hours 02/16/17 0400 02/19/17 0917   02/15/17 2145  clindamycin (CLEOCIN) IVPB 600 mg     600 mg 100 mL/hr over 30 Minutes Intravenous  Once 02/15/17 2140 02/15/17 2347   02/15/17 2000  piperacillin-tazobactam (ZOSYN) IVPB 3.375 g     3.375 g 100 mL/hr over 30 Minutes Intravenous  Once 02/15/17 1955 02/15/17 2131   02/15/17 2000  vancomycin (VANCOCIN) IVPB 1000 mg/200 mL premix     1,000 mg 200 mL/hr over 60 Minutes Intravenous  Once 02/15/17  1955 02/15/17 2348      Assessment/Plan: POD 6/3 debridement of gluteal wound/dressing change under anesthesia- Dr Dalbert Batman  - continue once daily dressing changes  -flexiseal intact, not real any soilage to wound, will not do colostomy on her due to risk -continue abx -does not need return to or - eventually may need plastics referral for wound [facility] 02/22/2017

## 2017-02-22 NOTE — Evaluation (Signed)
Clinical/Bedside Swallow Evaluation Patient Details  Name: Rayssa Atha MRN: [id number] Date of Birth: [birth year]  Today's Date: 02/22/2017 Time: SLP Start Time (ACUTE ONLY): 0951 SLP Stop Time (ACUTE ONLY): 1006 SLP Time Calculation (min) (ACUTE ONLY): 15 min  Past Medical History:  Past Medical History:  Diagnosis Date  . Anemia   . Anxiety   . Asthma   . Gastric ulcer   . Headache   . Hiatal hernia   . Hypertension   . PE (pulmonary embolism)   . Tachycardia    Past Surgical History:  Past Surgical History:  Procedure Laterality Date  . CESAREAN SECTION    . CHOLECYSTECTOMY    . INCISION AND DRAINAGE ABSCESS Left 02/16/2017   Procedure: DEBRIDEMENT LEFT BUTTOCK ABSCESS;  Surgeon: Fanny Skates, MD;  Location: [place];  Service: General;  Laterality: Left;  . IRRIGATION AND DEBRIDEMENT BUTTOCKS Left 02/19/2017   Procedure: DEBRIDEMENT OF GLUTEAL WOUND;  Surgeon: Rolm Bookbinder, MD;  Location: [place];  Service: General;  Laterality: Left;   HPI:  41 year old female with PMH of anemia, anxiety/depression, asthma, gastric ulcer, HTN, PE, and morbid obesity presented 4/5 with hypotension, tachy, febrile to 101.5, leukocytosis at 39,000 and a large necrotizing wound on left buttocks. Dx acute hypoxemic hypercapnic respiratory failure secondary to sepsis + unable to protect airway + HCAP +/- Pulm edema, AKI.  ETT 4/6-4/11.   Assessment / Plan / Recommendation Clinical Impression  Pt presents with functional oropharyngeal swallow marked by adequate mastication despite absence of teeth (pt has dentures but doesn't like to wear them), brisk swallow response, adequate coordination of respiratory/swallow cycle with appropriate exhalation post-swallow; no overt s/s of aspiration.  Passed three oz water test.  Recommend resuming PO diet - mechanical soft per pt's preferences.  No SLP f/u warranted.    SLP Visit Diagnosis: Dysphagia, unspecified (R13.10)    Aspiration Risk  No limitations     Diet Recommendation   mechanical soft, thin liquids  Medication Administration: Whole meds with liquid    Other  Recommendations Oral Care Recommendations: Oral care BID   Follow up Recommendations        Frequency and Duration            Prognosis        Swallow Study   General Date of Onset: 02/16/17 HPI: 41 year old female with PMH of anemia, anxiety/depression, asthma, gastric ulcer, HTN, PE, and morbid obesity presented 4/5 with hypotension, tachy, febrile to 101.5, leukocytosis at 39,000 and a large necrotizing wound on left buttocks. Dx acute hypoxemic hypercapnic respiratory failure secondary to sepsis + unable to protect airway + HCAP +/- Pulm edema, AKI.  ETT 4/6-4/11. Type of Study: Bedside Swallow Evaluation Previous Swallow Assessment: no Diet Prior to this Study: NPO Temperature Spikes Noted: No Respiratory Status: Nasal cannula History of Recent Intubation: Yes Length of Intubations (days): 6 days Date extubated: 02/21/17 Behavior/Cognition: Alert;Cooperative Oral Cavity Assessment: Within Functional Limits Oral Cavity - Dentition: Edentulous Vision: Functional for self-feeding Self-Feeding Abilities: Able to feed self Patient Positioning: Partially reclined Baseline Vocal Quality: Low vocal intensity Volitional Cough: Strong Volitional Swallow: Able to elicit    Oral/Motor/Sensory Function Overall Oral Motor/Sensory Function: Within functional limits   Ice Chips Ice chips: Within functional limits   Thin Liquid Thin Liquid: Within functional limits    Nectar Thick Nectar Thick Liquid: Not tested   Honey Thick Honey Thick Liquid: Not tested   Puree Puree: Within functional limits   Solid  GO   Solid: Within functional limits        Juan Quam Laurice 02/22/2017,10:11 AM  Estill Bamberg L. Tivis Ringer, [place] CCC/SLP Pager [phone]

## 2017-02-22 NOTE — Progress Notes (Addendum)
PULMONARY / CRITICAL CARE MEDICINE   Name: Megan Ortiz MRN: [id number] DOB: 1976/03/26    ADMISSION DATE:  02/15/2017 CONSULTATION DATE:  02/16/2017  REFERRING MD:  Dr. Oleta Mouse, EDP  CHIEF COMPLAINT:  Sepsis   HISTORY OF PRESENT ILLNESS:   41 year old female with PMH of anemia, anxiety/depression, asthma, gastric ulcer, HTN, PE, and morbid obesity presented 4/5 with hypotension, tachy, febrile to 101.5, leukocytosis at 39,000 and a large necrotizing wound on left buttocks. Required levophed for pressure support. S/p debridement 4/6 and 4/9. On Clinda + Zosyn since 4/5, narrowed to Zosyn 4/10. Culture growing Strep viridans with mixed anerobic flora.   SUBJECTIVE/INTERVAL HISTORY: Remains off pressors. Successfully extubated yesterday! Sat well on 4L via [place]. Off pressors.  Febrile ON 101.1, leukocytosis 24.  Net out 6.1 L yesterday, now +6.6 L up total since admission.  D5W ordered ON + K replacement (Na 151, K 2.9) -Zosyn  VITAL SIGNS: BP (!) 122/47   Pulse 95   Temp 97.8 F (36.6 C) (Oral)   Resp 14   Ht 5\' 5"  (1.651 m)   Wt (!) 479 lb (217.3 kg)   LMP 02/01/2017 (Approximate)   SpO2 96%   BMI 79.71 kg/m   VENTILATOR SETTINGS: Vent Mode: PSV;CPAP FiO2 (%):  [40 %-50 %] 40 % Set Rate:  [25 bmp] 25 bmp Vt Set:  [600 mL] 600 mL PEEP:  [5 cmH20-8 cmH20] 5 cmH20 Pressure Support:  [5 cmH20] 5 cmH20 Plateau Pressure:  [24 cmH20] 24 cmH20  INTAKE / OUTPUT: I/O last 3 completed shifts: In: 3071.3 [P.O.:90; I.V.:974; NG/GT:1090; IV Piggyback:917.3] Out: 66440 [Urine:9935; Stool:900]  PHYSICAL EXAMINATION: General:  Adult female, NAD, ill appearing . ETT Neuro: Sedate but arouses to verbal stimuli. Responds to questions with head mvmnts HEENT:  Normocephalic, +ETT, +NG Cardiovascular: RRR, no MRG, N S1/S2, pitting edema BL LE Lungs: Distant breath sounds, non-labored. Some mild coarse breath sounds. Abdomen:  Obese, non-tender, active bowel sounds . Rectal tube with  stool. Skin:  Warm, dry, open wound to buttocks - not evaluated at this time due to need for multiple assistance  LABS:  BMET  Recent Labs Lab 02/21/17 0342 02/21/17 1400 02/22/17 0315  NA 146* 151* 151*  K 2.6* 2.8* 2.9*  CL 110 110 111  CO2 28 31 32  BUN 34* 32* 25*  CREATININE 1.27* 1.25* 1.21*  GLUCOSE 118* 118* 98   Electrolytes  Recent Labs Lab 02/19/17 1615 02/20/17 0308 02/21/17 0342 02/21/17 1400 02/22/17 0315  CALCIUM  --  7.9* 8.3* 8.5* 8.2*  MG 2.0 2.1 1.8  --   --   PHOS 4.0 4.9* 3.3  --   --    CBC  Recent Labs Lab 02/20/17 0308 02/21/17 0342 02/22/17 0315  WBC 22.7* 24.1* 23.3*  HGB 7.6* 7.9* 7.6*  HCT 24.2* 25.2* 25.2*  PLT 320 351 355   Coag' No results for input(s): APTT, INR in the last 168 hours.  Sepsis Markers  Recent Labs Lab 02/16/17 0037 02/16/17 0502 02/17/17 0339 02/18/17 0000 02/18/17 0420  LATICACIDVEN 1.37  --   --  0.9 1.2  PROCALCITON  --  6.82 5.69  --  3.30    ABG  Recent Labs Lab 02/18/17 0230 02/19/17 0856 02/20/17 0300  PHART 7.340* 7.391 7.366  PCO2ART 34.8 32.7 39.7  PO2ART 79.9* 91.0 82.7*    Liver Enzymes  Recent Labs Lab 02/15/17 1955  AST 33  ALT 19  ALKPHOS 69  BILITOT 0.8  ALBUMIN 3.0*  Cardiac Enzymes  Recent Labs Lab 02/16/17 0502  TROPONINI 0.11*    Glucose  Recent Labs Lab 02/21/17 0846 02/21/17 1122 02/21/17 1621 02/21/17 2011 02/21/17 2334 02/22/17 0319  GLUCAP 133* 119* 112* 111* 94 92    Imaging Dg Chest Port 1 View  Result Date: 02/21/2017 CLINICAL DATA:  41 year old female status post surgical debridement of necrotizing soft tissue infection of the left gluteus. Sepsis. Remains intubated, attempted weaning yesterday. EXAM: PORTABLE CHEST 1 VIEW COMPARISON:  02/20/2017 and earlier. FINDINGS: Portable AP semi upright view at 0836 hours. Endotracheal tube tip projects between the clavicles and carina. Stable right IJ central line and visible enteric tube  which courses to the abdomen. The patient is more rotated to the right. Patchy and confluent bibasilar opacity greater on the right, probably not significantly changed since yesterday allowing for rotation. No pneumothorax. No pulmonary edema. Possible small right pleural effusion. Stable cardiac size and mediastinal contours. IMPRESSION: 1.  Stable lines and tubes. 2. Stable patchy lung base opacity suspicious for pneumonia. Possible small right pleural effusion. Electronically Signed   By: Genevie Ann M.D.   On: 02/21/2017 08:52   STUDIES:  CXR 4/5 > Mild enlargement of the cardiopericardial silhouette, pna vs atelectasis at the left base  CXR 4/8 . Patchy bilateral airspace opacities consistent with multifocal pneumonia vs asymmetric pulm edema CXR 4/10 > Stable. BL pulmonary opacities, R>L  CULTURES: Blood 4/5 > NGTD Wound 4/5 > strep viridans + mixed anerobes  ANTIBIOTICS: Clindamycin 4/5 >4/10 Zosyn 4/5 > Vancomycin 4/5 x1, d/c  SIGNIFICANT EVENTS: 4/5 > Presents to ED > sepsis secondary to necrotizing wound  4/6 > OR debridement of wound > remained intubated as plan to go back to OR > 3 pressors; no A line was able to be obtained x7 attempts 4/9 > OR debridement of wound 4/10 > attempt weaning 4/11 > Extubated successfully   LINES/TUBES: ETT 4/6 CVC 4/6 RIJ  DISCUSSION: 41 year old morbidly obese female presents to ED with septic shock secondary to necrotizing buttock wound. Transfered to [facility] for [facility] debridement. Now s/p 2 debridements, weaned off vent. Culture + strep viridans + mixed anerobes  ASSESSMENT / PLAN:  PULMONARY A: Acute hypoxemic hypercapnic respiratory failure secondary to sepsis + unable to protect airway + HCAP +/- Pulm edema H/O Asthma  P:   Weaned off vent 4/11, doing well on 4L via [place] Acidosis resolved Maintain Oxygen >90 Pulmonary Hygiene  Neb meds with pulmicort and Atrovent Zosyn  CARDIOVASCULAR A:  Septic shock 2/2 necrotizing  fasciitis. Weaned off of pressors > 48 hrs H/O HTN, PE P:  Cardiac Monitoring  Maintain MAP >65 Hold home Cardizem, Lisinopril, Spironolactone    RENAL A:   Acute Kidney Injury - stable and slowly improving. Cr 1.2 today, 2.4 on admission No longer acidotic, bicarb infusion d/c 4/10 K 2.9 this AM s/p 60 mEq ON, Mag pend. P:   Trend BMP, will repeat this afternoon and replete K + Mg as needed  GASTROINTESTINAL A:   Rectal tube in place 2/2 patients body habitus, proximity of large wound to rectum and also inability to ambulate currently.  P:   Bowel regimen Place rectal tube given pts super morbid obesity, proximity of large wound to rectum.   HEMATOLOGIC A:   Anemia, Hgb 7.6 today. Transfusion 1 unit 4/9 Leukocytosis 54 > 23 P:  Trend CBC Transfuse for Hbg <7  INFECTIOUS A:   Septic Shock secondary to necrotizing wound of buttocks. Streptococcus  viridans + mixed anerobes in culture.  S/P debridement of buttocks on 4/6, 4/9 Febrile to 101.1, leukocytosis 23.  Blood cultures negative FINAL. P:   Cont Zosyn, has broad coverage against strep and anerobes No plans for further debridement at this time Consider narrowing to Unasyn  ENDOCRINE A:  No issues    P:  Trend Glucose and SSI  NEUROLOGIC A:   Acute Pain  H/O Anxiety/Depression  P:  Pain controlled with Fent prn  Bethany Molt, D.O.  Attending:  I have seen and examined the patient with nurse practitioner/resident and agree with the note above.  We formulated the plan together and I elicited the following history.    Ms. Schouten feels better Some pain with turning Eating more  On exam Morbidly obese Lungs clear, normal effort CV: RRR, no mgr Belly soft  Nec fasc: post op wound care per surgery, will narrow antibiotics Asthma: improving Renal: AKI, improving, free water restrict Septic shock: improved  Transfer to SDU, TRH service  Roselie Awkward, MD [facility] Pager: [phone] Cell:  [phone] After 3pm or if no response, call [phone]

## 2017-02-22 NOTE — Progress Notes (Addendum)
Transfer   Arrival Method: stretcher from 65M Mental Orientation: alert and oriented x 4  Telemetry: box 2  Assessment: Completed Skin: see flowsheet  IV: Right IJ  Pain: 10/10 see MAR  Tubes: Flexiseal and foley cath  Safety Measures: Safety Fall Prevention Plan has been discussed  Admission: completed [facility] [place] Orientation: Patient has been orientated to the room, unit and staff.  Family: husband at bedside   Orders have been reviewed and implemented. Will continue to monitor the patient. Call light has been placed within reach and bed alarm has been activated.   Emilio Math, RN [facility]  Phone number: [phone]

## 2017-02-23 LAB — CBC
HEMATOCRIT: 26.1 % — AB (ref 36.0–46.0)
HEMOGLOBIN: 8.2 g/dL — AB (ref 12.0–15.0)
MCH: 27.8 pg (ref 26.0–34.0)
MCHC: 31.4 g/dL (ref 30.0–36.0)
MCV: 88.5 fL (ref 78.0–100.0)
Platelets: 343 10*3/uL (ref 150–400)
RBC: 2.95 MIL/uL — ABNORMAL LOW (ref 3.87–5.11)
RDW: 17.6 % — ABNORMAL HIGH (ref 11.5–15.5)
WBC: 21.9 10*3/uL — AB (ref 4.0–10.5)

## 2017-02-23 LAB — GLUCOSE, CAPILLARY
GLUCOSE-CAPILLARY: 104 mg/dL — AB (ref 65–99)
GLUCOSE-CAPILLARY: 105 mg/dL — AB (ref 65–99)
GLUCOSE-CAPILLARY: 114 mg/dL — AB (ref 65–99)
Glucose-Capillary: 132 mg/dL — ABNORMAL HIGH (ref 65–99)
Glucose-Capillary: 100 mg/dL — ABNORMAL HIGH (ref 65–99)
Glucose-Capillary: 101 mg/dL — ABNORMAL HIGH (ref 65–99)

## 2017-02-23 LAB — BASIC METABOLIC PANEL
ANION GAP: 10 (ref 5–15)
BUN: 19 mg/dL (ref 6–20)
CO2: 32 mmol/L (ref 22–32)
Calcium: 8 mg/dL — ABNORMAL LOW (ref 8.9–10.3)
Chloride: 100 mmol/L — ABNORMAL LOW (ref 101–111)
Creatinine, Ser: 0.96 mg/dL (ref 0.44–1.00)
Glucose, Bld: 103 mg/dL — ABNORMAL HIGH (ref 65–99)
POTASSIUM: 2.5 mmol/L — AB (ref 3.5–5.1)
SODIUM: 142 mmol/L (ref 135–145)

## 2017-02-23 MED ORDER — SODIUM CHLORIDE 0.9 % IV SOLN
3.0000 g | Freq: Three times a day (TID) | INTRAVENOUS | Status: DC
  Administered 2017-02-23 – 2017-02-25 (×7): 3 g via INTRAVENOUS
  Filled 2017-02-23 (×7): qty 3

## 2017-02-23 MED ORDER — POTASSIUM CHLORIDE CRYS ER 20 MEQ PO TBCR
40.0000 meq | EXTENDED_RELEASE_TABLET | Freq: Three times a day (TID) | ORAL | Status: DC
  Administered 2017-02-23 – 2017-03-09 (×40): 40 meq via ORAL
  Filled 2017-02-23 (×45): qty 2

## 2017-02-23 MED ORDER — POTASSIUM CHLORIDE CRYS ER 20 MEQ PO TBCR
40.0000 meq | EXTENDED_RELEASE_TABLET | Freq: Two times a day (BID) | ORAL | Status: DC
  Administered 2017-02-23: 40 meq via ORAL
  Filled 2017-02-23: qty 2

## 2017-02-23 NOTE — Care Management Obs Status (Signed)
[place] NOTIFICATION   Patient Details  Name: Megan Ortiz MRN: [id number] Date of Birth: [birth year]   Medicare Observation Status Notification Given:       Adron Bene, RN 02/23/2017, 5:24 PM

## 2017-02-23 NOTE — Progress Notes (Signed)
PROGRESS NOTE    Megan Ortiz  OAC:[id number] DOB: [birth year] DOA: 02/15/2017 PCP: No PCP Per Patient  Outpatient Specialists:     Brief Narrative:  41 Smoker Bipolar Reflux Iron deficiency anemia Migraine Pulmonary embolism Asthma Super obesity BMI 70 Migraine  Admitted by critical care 02/16/2017 -necrotizing wound of left buttock, admit with MODS [ hypotension, tachycardia, MAXIMUM TEMPERATURE 101, needing sepsis protocol, started on Levaquin 5] History of present illness 13/0.6--31/2.4 and admission Lactic acidosis 4.6, white count 42 hemoglobin 8.5 Transferred for Gen. surgery for debridement-wound grew strep viridans mixed anaerobic flora Eventually diuresed after initial volume resuscitation  Assessment & Plan:   Principal Problem:   Necrotizing soft tissue infection ([place]) Active Problems:   Septic shock ([place])   Acute hypoxemic respiratory failure ([place])   AKI (acute kidney injury) ([place])   Acute type 1/type II respiratory failure secondary to sepsis , underlying diagnosis of asthma and prior smoker   Extubated 4/11 but on 4 L-nebulizer with Pulmicort and Atrovent-no need steroids currently  Buttock abscess/Necrotizing fasciitis with strep viridans, divided 4/6, 02/19/17 + hospital-acquired pneumonia  Continue unasyn started 4/11 [zosyn 4/5-->4/11]. Last x-ray 4/11 and small effusion.  Acute kidney injury-secondary to sepsis physiology Prior hypernatremia Hypokalemia secondary to diuresis  Initially treated with bicarbonate to 4/10 volume resuscitated. Then diuresed. See below.  increasing Kdur 40 bid to TID-labs am, check am magnesium-might need IV K  Probable underlying congestive heart failure versus OHSS as well as pulmonary hypertension  Currently on IV Lasix 40 twice a day. Baseline weight =204 kg 08/2016 and was 204 on arrival to ED 02/15/17.   need continued diuresis to get to goal weight. Creatinine has paradoxically improved since stopping fluid and  diuresis  Anemia secondary to sepsis, blood loss after surgery expected  Transfused 4/9. Stable at present time  Iatrogenic diarrhea  Need to keep stool loose.  Keep flexi-seal-high risk for complications  [place] of OHSS  Monitor for ncoturnal/sleeping O2 sat   Full code Tele Inpatient pending resolution--might need a while for prlim wound healing then plastic surgery inpiut for a flap   Consultants:   surgery  Procedures:   none  Antimicrobials:   Zosyn  unasyn    Subjective:  Awake alert fair In mofd pain Not OOb yet Still on oxygen  Objective: Vitals:   02/22/17 2042 02/23/17 0155 02/23/17 0438 02/23/17 0736  BP: (!) 143/51  (!) 129/49 (!) 123/59  Pulse: 90  83 91  Resp: 20  18 (!) 22  Temp: 98.2 F (36.8 C)  98.4 F (36.9 C) 98.2 F (36.8 C)  TempSrc: Oral  Oral Oral  SpO2: 94% 94% 95% 95%  Weight: (!) 231.3 kg (510 lb)     Height: '5\' 4"'  (1.626 m)       Intake/Output Summary (Last 24 hours) at 02/23/17 0805 Last data filed at 02/23/17 0520  Gross per 24 hour  Intake           1410.5 ml  Output             4250 ml  Net          -2839.5 ml   Filed Weights   02/21/17 0330 02/22/17 0500 02/22/17 2042  Weight: (!) 216.8 kg (478 lb) (!) 217.3 kg (479 lb) (!) 231.3 kg (510 lb)    Examination:  Alert Body mass index is 87.54 kg/m.  No ict no pallor cta b Wound not examined --see Gen surgery pix fromt his am on oxygen  s1 s 2no m/r/g  No kle edeam Flat affect oriented Moving 4 limbs =    Data Reviewed: I have personally reviewed following labs and imaging studies  CBC:  Recent Labs Lab 02/19/17 0747 02/19/17 1216 02/20/17 0308 02/21/17 0342 02/22/17 0315 02/23/17 0507  WBC 18.6*  --  22.7* 24.1* 23.3* 21.9*  HGB 6.9* 7.9* 7.6* 7.9* 7.6* 8.2*  HCT 20.8* 24.6* 24.2* 25.2* 25.2* 26.1*  MCV 83.5  --  85.5 86.0 88.1 88.5  PLT 285  --  320 351 355 542   Basic Metabolic Panel:  Recent Labs Lab 02/18/17 1712 02/19/17 0454  02/19/17 1615 02/20/17 0308 02/21/17 0342 02/21/17 1400 02/22/17 0315 02/23/17 0507  NA  --  141  --  144 146* 151* 151* 142  K  --  3.2*  --  3.5 2.6* 2.8* 2.9* 2.5*  CL  --  110  --  111 110 110 111 100*  CO2  --  20*  --  '23 28 31 ' 32 32  GLUCOSE  --  99  --  107* 118* 118* 98 103*  BUN  --  40*  --  40* 34* 32* 25* 19  CREATININE  --  1.70*  --  1.46* 1.27* 1.25* 1.21* 0.96  CALCIUM  --  7.8*  --  7.9* 8.3* 8.5* 8.2* 8.0*  MG 2.1 2.1 2.0 2.1 1.8  --   --   --   PHOS 2.4* 2.8 4.0 4.9* 3.3  --   --   --    GFR: Estimated Creatinine Clearance: 152.5 mL/min (by C-G formula based on SCr of 0.96 mg/dL). Liver Function Tests: No results for input(s): AST, ALT, ALKPHOS, BILITOT, PROT, ALBUMIN in the last 168 hours. No results for input(s): LIPASE, AMYLASE in the last 168 hours. No results for input(s): AMMONIA in the last 168 hours. Coagulation Profile: No results for input(s): INR, PROTIME in the last 168 hours. Cardiac Enzymes: No results for input(s): CKTOTAL, CKMB, CKMBINDEX, TROPONINI in the last 168 hours. BNP (last 3 results) No results for input(s): PROBNP in the last 8760 hours. HbA1C: No results for input(s): HGBA1C in the last 72 hours. CBG:  Recent Labs Lab 02/22/17 1156 02/22/17 1609 02/22/17 2103 02/23/17 0021 02/23/17 0443  GLUCAP 127* 128* 120* 104* 105*   Lipid Profile: No results for input(s): CHOL, HDL, LDLCALC, TRIG, CHOLHDL, LDLDIRECT in the last 72 hours. Thyroid Function Tests: No results for input(s): TSH, T4TOTAL, FREET4, T3FREE, THYROIDAB in the last 72 hours. Anemia Panel: No results for input(s): VITAMINB12, FOLATE, FERRITIN, TIBC, IRON, RETICCTPCT in the last 72 hours. Urine analysis:    Component Value Date/Time   COLORURINE AMBER (A) 02/16/2017 0100   APPEARANCEUR HAZY (A) 02/16/2017 0100   LABSPEC 1.026 02/16/2017 0100   PHURINE 5.0 02/16/2017 0100   GLUCOSEU NEGATIVE 02/16/2017 0100   HGBUR NEGATIVE 02/16/2017 0100   BILIRUBINUR  NEGATIVE 02/16/2017 0100   KETONESUR NEGATIVE 02/16/2017 0100   PROTEINUR NEGATIVE 02/16/2017 0100   UROBILINOGEN 0.2 05/11/2013 1903   NITRITE NEGATIVE 02/16/2017 0100   LEUKOCYTESUR NEGATIVE 02/16/2017 0100   Sepsis Labs: '@LABRCNTIP' (procalcitonin:4,lacticidven:4)  ) Recent Results (from the past 240 hour(s))  Blood culture (routine x 2)     Status: None   Collection Time: 02/15/17  8:33 PM  Result Value Ref Range Status   Specimen Description LEFT ANTECUBITAL  Final   Special Requests   Final    BOTTLES DRAWN AEROBIC AND ANAEROBIC Blood Culture adequate volume   Culture  NO GROWTH 5 DAYS  Final   Report Status 02/20/2017 FINAL  Final  Blood culture (routine x 2)     Status: None   Collection Time: 02/15/17  8:33 PM  Result Value Ref Range Status   Specimen Description BLOOD LEFT FOREARM  Final   Special Requests   Final    BOTTLES DRAWN AEROBIC AND ANAEROBIC Blood Culture adequate volume   Culture NO GROWTH 5 DAYS  Final   Report Status 02/20/2017 FINAL  Final  MRSA PCR Screening     Status: None   Collection Time: 02/16/17  4:36 AM  Result Value Ref Range Status   MRSA by PCR NEGATIVE NEGATIVE Final    Comment:        The GeneXpert MRSA Assay (FDA approved for NASAL specimens only), is one component of a comprehensive MRSA colonization surveillance program. It is not intended to diagnose MRSA infection nor to guide or monitor treatment for MRSA infections.   Aerobic/Anaerobic Culture (surgical/deep wound)     Status: None   Collection Time: 02/16/17  9:25 AM  Result Value Ref Range Status   Specimen Description ABSCESS  Final   Special Requests LEFT BUTTOCK  Final   Gram Stain   Final    FEW WBC PRESENT, PREDOMINANTLY MONONUCLEAR FEW GRAM POSITIVE COCCI IN PAIRS RARE GRAM NEGATIVE RODS RARE GRAM POSITIVE RODS    Culture   Final    FEW VIRIDANS STREPTOCOCCUS MIXED ANAEROBIC FLORA PRESENT.  CALL LAB IF FURTHER IID REQUIRED.    Report Status 02/21/2017 FINAL   Final         Radiology Studies: Dg Chest Port 1 View  Result Date: 02/21/2017 CLINICAL DATA:  41 year old female status post surgical debridement of necrotizing soft tissue infection of the left gluteus. Sepsis. Remains intubated, attempted weaning yesterday. EXAM: PORTABLE CHEST 1 VIEW COMPARISON:  02/20/2017 and earlier. FINDINGS: Portable AP semi upright view at 0836 hours. Endotracheal tube tip projects between the clavicles and carina. Stable right IJ central line and visible enteric tube which courses to the abdomen. The patient is more rotated to the right. Patchy and confluent bibasilar opacity greater on the right, probably not significantly changed since yesterday allowing for rotation. No pneumothorax. No pulmonary edema. Possible small right pleural effusion. Stable cardiac size and mediastinal contours. IMPRESSION: 1.  Stable lines and tubes. 2. Stable patchy lung base opacity suspicious for pneumonia. Possible small right pleural effusion. Electronically Signed   By: Genevie Ann M.D.   On: 02/21/2017 08:52        Scheduled Meds: . ampicillin-sulbactam (UNASYN) IV  3 g Intravenous Q8H  . budesonide (PULMICORT) nebulizer solution  0.5 mg Nebulization BID  . chlorhexidine gluconate (MEDLINE KIT)  15 mL Mouth Rinse BID  . Chlorhexidine Gluconate Cloth  6 each Topical Q0600  . enoxaparin (LOVENOX) injection  100 mg Subcutaneous Q24H  . furosemide  40 mg Intravenous BID  . ipratropium  0.5 mg Nebulization Q6H  . mouth rinse  15 mL Mouth Rinse QID  . pantoprazole (PROTONIX) IV  40 mg Intravenous QHS  . polyethylene glycol  17 g Per Tube Daily  . potassium chloride  40 mEq Oral BID  . senna-docusate  1 tablet Per Tube BID  . sodium chloride flush  10-40 mL Intracatheter Q12H   Continuous Infusions: . dextrose 30 mL/hr at 02/23/17 0749     LOS: 7 days    Time spent: [place], MD [facility] [phone]  If 7PM-7AM, please contact  night-coverage [email or web address] Password [email or web address] 02/23/2017, 8:05 AM

## 2017-02-23 NOTE — Progress Notes (Signed)
4 Days Post-Op  Subjective: Still leaking from fleixseal    Objective: Vital signs in last 24 hours: Temp:  [98.2 F (36.8 C)-98.5 F (36.9 C)] 98.2 F (36.8 C) (04/13 0736) Pulse Rate:  [83-101] 91 (04/13 0736) Resp:  [16-22] 22 (04/13 0736) BP: (110-143)/(47-64) 123/59 (04/13 0736) SpO2:  [93 %-96 %] 94 % (04/13 0856) Weight:  [231.3 kg (510 lb)] 231.3 kg (510 lb) (04/12 2042) Last BM Date: 02/23/17  Intake/Output from previous day: 04/12 0701 - 04/13 0700 In: 2800 [P.O.:800; I.V.:540.5; IV Piggyback:212.5] Out: 4250 [Urine:4250] Intake/Output this shift: Total I/O In: 239.5 [I.V.:239.5] Out: -   General appearance: alert, cooperative, mild distress and during dressing change Skin: Skin color, texture, turgor normal. No rashes or lesions or see picture     Lab Results:   Recent Labs  02/22/17 0315 02/23/17 0507  WBC 23.3* 21.9*  HGB 7.6* 8.2*  HCT 25.2* 26.1*  PLT 355 343    BMET  Recent Labs  02/22/17 0315 02/23/17 0507  NA 151* 142  K 2.9* 2.5*  CL 111 100*  CO2 32 32  GLUCOSE 98 103*  BUN 25* 19  CREATININE 1.21* 0.96  CALCIUM 8.2* 8.0*   PT/INR No results for input(s): LABPROT, INR in the last 72 hours.  No results for input(s): AST, ALT, ALKPHOS, BILITOT, PROT, ALBUMIN in the last 168 hours.   Lipase     Component Value Date/Time   LIPASE 24 05/11/2013 1912     Studies/Results: No results found.  Medications: . ampicillin-sulbactam (UNASYN) IV  3 g Intravenous Q8H  . budesonide (PULMICORT) nebulizer solution  0.5 mg Nebulization BID  . chlorhexidine gluconate (MEDLINE KIT)  15 mL Mouth Rinse BID  . Chlorhexidine Gluconate Cloth  6 each Topical Q0600  . enoxaparin (LOVENOX) injection  100 mg Subcutaneous Q24H  . furosemide  40 mg Intravenous BID  . ipratropium  0.5 mg Nebulization Q6H  . mouth rinse  15 mL Mouth Rinse QID  . pantoprazole (PROTONIX) IV  40 mg Intravenous QHS  . polyethylene glycol  17 g Per Tube Daily  .  potassium chloride  40 mEq Oral BID  . potassium chloride  40 mEq Oral BID  . senna-docusate  1 tablet Per Tube BID  . sodium chloride flush  10-40 mL Intracatheter Q12H   . dextrose 30 mL/hr at 02/23/17 3491    Assessment/Plan   POD 7/4debridement of gluteal wound/dressing change under anesthesia- Dr Dalbert Batman  - continue once daily dressing changes  -flexiseal intact, not real any soilage to wound, will not do colostomy on her due to risk -continue abx -does not need return to or - eventually may need plastics referral for wound      LOS: 7 days    Kilea Mccarey 02/23/2017 [phone]

## 2017-02-23 NOTE — Progress Notes (Signed)
Physical Therapy Treatment Patient Details Name: Megan Ortiz MRN: [id number] DOB: [birth year] Today's Date: 02/23/2017    History of Present Illness 41 year old female with PMH of anemia, anxiety/depression, asthma, gastric ulcer, HTN, PE, and morbid obesity. Presents to [facility] ED on 4/5 with hypotension, tachycardia, and temp of 101.5. States that she has had a few days of diarrhea and decreased oral intake. Upon arrival to ED it was revealed that patient had an enlarging necrotizing wound of the buttocks (Patient states she first noted an "blister" about two days ago).  Transfered from [facility] to [facility] for further management.  Post I and D of gluteal wound.  Intubated post op, Extubated 4/11.    PT Comments    Assist pt in getting adequately repositioned with a draw sheet under her for further assist as needed.   Follow Up Recommendations  SNF;Other (comment) (will work toward [employer] or HHPT)     Clinical biochemist with 5" wheels    Recommendations for Other Services       Precautions / Restrictions Precautions Precautions: Fall    Mobility  Bed Mobility Overal bed mobility: Needs Assistance Bed Mobility: Rolling Rolling:  (max of 3 or more) Sidelying to sit: Max assist;+2 for physical assistance   Sit to supine: Max assist;Total assist;+2 for physical assistance   General bed mobility comments: pt needed repositioning and boosting up toward [facility] while bed was non functional.  Rolled to add a draw sheet to assist with further rolling and positioning in the bed.  Transfers                 General transfer comment: pt unable to make it all the way to EOB due to buttock pain.  Ambulation/Gait                 Stairs            Wheelchair Mobility    Modified Rankin (Stroke Patients Only)       Balance Overall balance assessment: Needs assistance Sitting-balance support: Bilateral upper extremity supported Sitting  balance-Leahy Scale: Poor Sitting balance - Comments: pt needed full support and bil UE's in sitting due to bed surface, weakness and buttock pain       Standing balance comment: unable to try standing                            Cognition Arousal/Alertness: Awake/alert Behavior During Therapy: Anxious Overall Cognitive Status: Within Functional Limits for tasks assessed                                        Exercises      General Comments General comments (skin integrity, edema, etc.): patient assisted significantly throughout, but still mobility was quite difficult and painful      Pertinent Vitals/Pain Pain Assessment: Faces Faces Pain Scale: Hurts even more Pain Location: L buttock Pain Descriptors / Indicators: Burning;Grimacing;Moaning Pain Intervention(s): Repositioned;Patient requesting pain meds-RN notified    Home Living Family/patient expects to be discharged to:: Private residence Living Arrangements: Spouse/significant other Available Help at Discharge: Family;Available 24 hours/day Type of Home: House Home Access: Stairs to enter   Home Layout: One level Home Equipment: Kasandra Knudsen - single point      Prior Function Level of Independence: Independent      Comments: Did  her ADL's, cooked, light cleaning, no AD, Stayed home most of time   PT Goals (current goals can now be found in the care plan section) Acute Rehab PT Goals Patient Stated Goal: home PT Goal Formulation: With patient Time For Goal Achievement: 03/09/17 Potential to Achieve Goals: Good Progress towards PT goals: Progressing toward goals    Frequency    Min 3X/week      PT Plan Current plan remains appropriate    Co-evaluation             End of Session Equipment Utilized During Treatment: Oxygen;Other (comment) (used paper pads to assist moving without success) Activity Tolerance: Patient limited by fatigue;Patient tolerated treatment well Patient  left: in bed;with call bell/phone within reach;with bed alarm set Nurse Communication: Mobility status PT Visit Diagnosis: Other abnormalities of gait and mobility (R26.89);Muscle weakness (generalized) (M62.81);Difficulty in walking, not elsewhere classified (R26.2)     Time: 4765-4650 PT Time Calculation (min) (ACUTE ONLY): 20 min  Charges:  $Therapeutic Activity: 8-22 mins                    G Codes:       2017-03-10  Megan Ortiz, PT [phone]  (pager)   Megan Ortiz March 10, 2017, 7:28 PM

## 2017-02-23 NOTE — Evaluation (Signed)
Physical Therapy Evaluation Patient Details Name: Megan Ortiz MRN: [id number] DOB: [birth year] Today's Date: 02/23/2017   History of Present Illness  41 year old female with PMH of anemia, anxiety/depression, asthma, gastric ulcer, HTN, PE, and morbid obesity. Presents to [facility] ED on 4/5 with hypotension, tachycardia, and temp of 101.5. States that she has had a few days of diarrhea and decreased oral intake. Upon arrival to ED it was revealed that patient had an enlarging necrotizing wound of the buttocks (Patient states she first noted an "blister" about two days ago).  Transfered from [facility] to [facility] for further management.  Post I and D of gluteal wound.  Intubated post op, Extubated 4/11.  Clinical Impression  Pt admitted with/for above complication.  Pt is unable to move in the bed or up to EOB without significant assist and pain.  Pt currently limited functionally due to the problems listed. ( See problems list.)   Pt will benefit from PT to maximize function and safety in order to get ready for next venue listed below.     Follow Up Recommendations SNF;Other (comment) (but are working toward [employer] or HHPT)    Clinical biochemist with 5" wheels    Recommendations for Other Services       Precautions / Restrictions Precautions Precautions: Fall      Mobility  Bed Mobility Overal bed mobility: Needs Assistance Bed Mobility: Rolling;Sidelying to Sit;Sit to Supine Rolling: Max assist;+2 for physical assistance Sidelying to sit: Max assist;+2 for physical assistance   Sit to supine: Max assist;Total assist;+2 for physical assistance   General bed mobility comments: pt assisted significantly with upper body and alot with Lower boday, but still difficult due to mass in her midsection.  Transfers                 General transfer comment: pt unable to make it all the way to EOB due to buttock pain.  Ambulation/Gait                Stairs            Wheelchair Mobility    Modified Rankin (Stroke Patients Only)       Balance Overall balance assessment: Needs assistance Sitting-balance support: Bilateral upper extremity supported Sitting balance-Leahy Scale: Poor Sitting balance - Comments: pt needed full support and bil UE's in sitting due to bed surface, weakness and buttock pain       Standing balance comment: unable to try standing                             Pertinent Vitals/Pain Pain Assessment: Faces Faces Pain Scale: Hurts even more Pain Location: L buttock Pain Descriptors / Indicators: Burning;Grimacing;Moaning Pain Intervention(s): Limited activity within patient's tolerance;Monitored during session;Repositioned    Home Living Family/patient expects to be discharged to:: Private residence Living Arrangements: Spouse/significant other Available Help at Discharge: Family;Available 24 hours/day Type of Home: House Home Access: Stairs to enter     Home Layout: One level Home Equipment: Kasandra Knudsen - single point      Prior Function Level of Independence: Independent         Comments: Did her ADL's, cooked, light cleaning, no AD, Stayed home most of time     Hand Dominance        Extremity/Trunk Assessment   Upper Extremity Assessment Upper Extremity Assessment: Overall WFL for tasks assessed    Lower Extremity Assessment  Lower Extremity Assessment: RLE deficits/detail;LLE deficits/detail RLE Deficits / Details: decent strength, but limited by pt's weight RLE Coordination: decreased fine motor LLE Deficits / Details: strength grossly >4/5, but difficult to move her mass       Communication   Communication: No difficulties  Cognition Arousal/Alertness: Awake/alert Behavior During Therapy: Anxious Overall Cognitive Status: Within Functional Limits for tasks assessed                                        General Comments General comments (skin integrity,  edema, etc.): pt too tired to fully repostition in supine after session and the bari bed was broken and no power functions worked to assist.    Exercises     Assessment/Plan    PT Assessment Patient needs continued PT services  PT Problem List Decreased strength;Decreased range of motion;Decreased activity tolerance;Decreased balance;Decreased mobility;Obesity;Pain;Decreased skin integrity       PT Treatment Interventions DME instruction;Gait training;Functional mobility training;Therapeutic activities;Balance training;Patient/family education    PT Goals (Current goals can be found in the Care Plan section)  Acute Rehab PT Goals Patient Stated Goal: home PT Goal Formulation: With patient Time For Goal Achievement: 03/09/17 Potential to Achieve Goals: Good    Frequency Min 3X/week   Barriers to discharge        Co-evaluation               End of Session Equipment Utilized During Treatment: Oxygen;Other (comment) (used paper pads to assist moving without success) Activity Tolerance: Patient limited by fatigue;Patient limited by pain Patient left: in bed;with call bell/phone within reach;with bed alarm set Nurse Communication: Mobility status PT Visit Diagnosis: Other abnormalities of gait and mobility (R26.89);Muscle weakness (generalized) (M62.81);Difficulty in walking, not elsewhere classified (R26.2)    Time: 1962-2297 PT Time Calculation (min) (ACUTE ONLY): 39 min   Charges:   PT Evaluation $PT Eval High Complexity: 1 Procedure PT Treatments $Therapeutic Activity: 23-37 mins   PT G Codes:        2017-03-01  Donnella Sham, PT [phone]  (pager)  Tessie Fass Lenix Benoist 2017-03-01, 7:17 PM

## 2017-02-23 NOTE — Progress Notes (Signed)
The patients [place] is listed as a per tube route. MD notified. Will continue to monitor.

## 2017-02-24 DIAGNOSIS — M7989 Other specified soft tissue disorders: Secondary | ICD-10-CM

## 2017-02-24 LAB — COMPREHENSIVE METABOLIC PANEL
ALBUMIN: 1.9 g/dL — AB (ref 3.5–5.0)
ALT: 20 U/L (ref 14–54)
AST: 20 U/L (ref 15–41)
Alkaline Phosphatase: 63 U/L (ref 38–126)
Anion gap: 11 (ref 5–15)
BUN: 14 mg/dL (ref 6–20)
CHLORIDE: 98 mmol/L — AB (ref 101–111)
CO2: 31 mmol/L (ref 22–32)
CREATININE: 0.97 mg/dL (ref 0.44–1.00)
Calcium: 8.2 mg/dL — ABNORMAL LOW (ref 8.9–10.3)
GFR calc Af Amer: >60 mL/min (ref >60)
GFR calc non Af Amer: >60 mL/min (ref >60)
GLUCOSE: 120 mg/dL — AB (ref 65–99)
POTASSIUM: 3 mmol/L — AB (ref 3.5–5.1)
SODIUM: 140 mmol/L (ref 135–145)
Total Bilirubin: 0.5 mg/dL (ref 0.3–1.2)
Total Protein: 6.5 g/dL (ref 6.5–8.1)

## 2017-02-24 LAB — GLUCOSE, CAPILLARY
GLUCOSE-CAPILLARY: 130 mg/dL — AB (ref 65–99)
Glucose-Capillary: 107 mg/dL — ABNORMAL HIGH (ref 65–99)
Glucose-Capillary: 115 mg/dL — ABNORMAL HIGH (ref 65–99)
Glucose-Capillary: 116 mg/dL — ABNORMAL HIGH (ref 65–99)
Glucose-Capillary: 125 mg/dL — ABNORMAL HIGH (ref 65–99)
Glucose-Capillary: 162 mg/dL — ABNORMAL HIGH (ref 65–99)

## 2017-02-24 LAB — MAGNESIUM: MAGNESIUM: 1.5 mg/dL — AB (ref 1.7–2.4)

## 2017-02-24 LAB — CBC
HEMATOCRIT: 27.1 % — AB (ref 36.0–46.0)
Hemoglobin: 8.3 g/dL — ABNORMAL LOW (ref 12.0–15.0)
MCH: 27 pg (ref 26.0–34.0)
MCHC: 30.6 g/dL (ref 30.0–36.0)
MCV: 88.3 fL (ref 78.0–100.0)
Platelets: 345 10*3/uL (ref 150–400)
RBC: 3.07 MIL/uL — ABNORMAL LOW (ref 3.87–5.11)
RDW: 17.3 % — AB (ref 11.5–15.5)
WBC: 18.1 10*3/uL — AB (ref 4.0–10.5)

## 2017-02-24 MED ORDER — SENNOSIDES-DOCUSATE SODIUM 8.6-50 MG PO TABS
1.0000 | ORAL_TABLET | Freq: Every day | ORAL | Status: DC
  Administered 2017-02-26 – 2017-02-28 (×3): 1
  Filled 2017-02-24 (×4): qty 1

## 2017-02-24 MED ORDER — IPRATROPIUM BROMIDE 0.02 % IN SOLN
0.5000 mg | Freq: Three times a day (TID) | RESPIRATORY_TRACT | Status: DC
  Administered 2017-02-25 – 2017-02-26 (×4): 0.5 mg via RESPIRATORY_TRACT
  Filled 2017-02-24 (×4): qty 2.5

## 2017-02-24 MED ORDER — POLYETHYLENE GLYCOL 3350 17 G PO PACK: 17.0000 g | PACK | Freq: Every day | ORAL | Status: DC | PRN

## 2017-02-24 MED ORDER — MAGNESIUM OXIDE 400 (241.3 MG) MG PO TABS
400.0000 mg | ORAL_TABLET | Freq: Two times a day (BID) | ORAL | Status: AC
  Administered 2017-02-24 – 2017-02-25 (×4): 400 mg via ORAL
  Filled 2017-02-24 (×4): qty 1

## 2017-02-24 NOTE — Progress Notes (Signed)
PROGRESS NOTE    Megan Ortiz  BCW:[id number] DOB: 05-07-1976 DOA: 02/15/2017 PCP: No PCP Per Patient  Outpatient Specialists:     Brief Narrative:  64 Smoker Bipolar Reflux Iron deficiency anemia Migraine Pulmonary embolism Asthma Super obesity BMI 70 Migraine  Admitted by critical care 02/16/2017 -necrotizing wound of left buttock, admit with MODS [ hypotension, tachycardia, MAXIMUM TEMPERATURE 101, needing sepsis protocol, started on Levaquin 5] History of present illness 13/0.6--31/2.4 and admission Lactic acidosis 4.6, white count 42 hemoglobin 8.5 Transferred for Gen. surgery for debridement-wound grew strep viridans mixed anaerobic flora Eventually diuresed after initial volume resuscitation  Assessment & Plan:   Principal Problem:   Necrotizing soft tissue infection Active Problems:   Septic shock ([place])   Acute hypoxemic respiratory failure ([place])   AKI (acute kidney injury) ([place])   Acute type 1/type II respiratory failure secondary to sepsis , underlying diagnosis of asthma and prior smoker   Extubated 4/11 but on 4 L-nebulizer with Pulmicort and Atrovent-no need steroids currently--sounds cleasr to chest  Buttock abscess/Necrotizing fasciitis with strep viridans, divided 4/6, 02/19/17 + hospital-acquired pneumonia  Continue unasyn started 4/11 [zosyn 4/5-->4/11]. Last x-ray 4/11 and small effusion.  Monitor-would taper Abx for standard therapy of Aspiraiton 14 days ending 4/19 --de-escalate to PO abx 24-48 hr.  Will ask gen surgery thoughts on the same.  WBc progressively down from 40 on admit-->18  Acute kidney injury-secondary to sepsis physiology Prior hypernatremia Hypokalemia secondary to diuresis  Initially treated with bicarbonate to 4/10 volume resuscitated. Then diuresed. See below.  increasing Kdur 40 bid to TID-labs am,  Magnesium 1.5-started mag ox 40 bid-might need IV K if no better in am  Probable underlying congestive heart failure versus OHSS  as well as pulmonary hypertension  Currently on IV Lasix 40 twice a day. Baseline weight =204 kg 08/2016 and was 204 on arrival to ED 02/15/17.  She is now 231.  Needs relatively aggressive diuresiss need continued diuresis to get to goal weight. Creatinine has paradoxically improved since stopping fluid and diuresis  Anemia secondary to sepsis, blood loss after surgery expected  Transfused 4/9. Stable at present time  Iatrogenic diarrhea  Need to keep stool loose.  Keep flexi-seal-high risk for complications-cutting back laxatives given incontinence as per gen surg  [place] of OHSS  Monitor for ncoturnal/sleeping O2 sat   Full code Tele Inpatient pending resolution--might need a while for prlim wound healing then plastic surgery input for a flap   Consultants:   surgery  Procedures:   none  Antimicrobials:   Zosyn  unasyn    Subjective:  Awake alert fair Pain manageable-wounds bleeding per rn No other real c/o  Asking for indeigestion meds-still on oxygen  Objective: Vitals:   02/23/17 2103 02/24/17 0422 02/24/17 0820 02/24/17 0918  BP: 130/63 130/65 (!) 147/71   Pulse: 93 88 95   Resp: _0 Temp: 98.3 F (36.8 C) 98.8 F (37.1 C) 98.3 F (36.8 C)   TempSrc: Oral Oral Oral   SpO2: 93% 93% 93% 95%  Weight:      Height:        Intake/Output Summary (Last 24 hours) at 02/24/17 1522 Last data filed at 02/24/17 1300  Gross per 24 hour  Intake             1450 ml  Output             5750 ml  Net            -  4300 ml   Filed Weights   02/21/17 0330 02/22/17 0500 02/22/17 2042  Weight: (!) 216.8 kg (478 lb) (!) 217.3 kg (479 lb) (!) 231.3 kg (510 lb)    Examination:  Alert Body mass index is 87.54 kg/m.  No ict no pallor cta b Wound not examined today-had already been dressed on oxygen s1 s 2no m/r/g  No le edeam     Data Reviewed: I have personally reviewed following labs and imaging studies  CBC:  Recent Labs Lab 02/20/17 0308  02/21/17 0342 02/22/17 0315 02/23/17 0507 02/24/17 0457  WBC 22.7* 24.1* 23.3* 21.9* 18.1*  HGB 7.6* 7.9* 7.6* 8.2* 8.3*  HCT 24.2* 25.2* 25.2* 26.1* 27.1*  MCV 85.5 86.0 88.1 88.5 88.3  PLT 320 351 355 343 559   Basic Metabolic Panel:  Recent Labs Lab 02/18/17 1712 02/19/17 0454 02/19/17 1615 02/20/17 0308 02/21/17 0342 02/21/17 1400 02/22/17 0315 02/23/17 0507 02/24/17 0457  NA  --  141  --  144 146* 151* 151* 142 140  K  --  3.2*  --  3.5 2.6* 2.8* 2.9* 2.5* 3.0*  CL  --  110  --  111 110 110 111 100* 98*  CO2  --  20*  --  _0 32 32 31  GLUCOSE  --  99  --  107* 118* 118* 98 103* 120*  BUN  --  40*  --  40* 34* 32* 25* 19 14  CREATININE  --  1.70*  --  1.46* 1.27* 1.25* 1.21* 0.96 0.97  CALCIUM  --  7.8*  --  7.9* 8.3* 8.5* 8.2* 8.0* 8.2*  MG 2.1 2.1 2.0 2.1 1.8  --   --   --  1.5*  PHOS 2.4* 2.8 4.0 4.9* 3.3  --   --   --   --    GFR: Estimated Creatinine Clearance: 151 mL/min (by C-G formula based on SCr of 0.97 mg/dL). Liver Function Tests:  Recent Labs Lab 02/24/17 0457  AST 20  ALT 20  ALKPHOS 63  BILITOT 0.5  PROT 6.5  ALBUMIN 1.9*   No results for input(s): LIPASE, AMYLASE in the last 168 hours. No results for input(s): AMMONIA in the last 168 hours. Coagulation Profile: No results for input(s): INR, PROTIME in the last 168 hours. Cardiac Enzymes: No results for input(s): CKTOTAL, CKMB, CKMBINDEX, TROPONINI in the last 168 hours. BNP (last 3 results) No results for input(s): PROBNP in the last 8760 hours. HbA1C: No results for input(s): HGBA1C in the last 72 hours. CBG:  Recent Labs Lab 02/23/17 2109 02/24/17 0006 02/24/17 0414 02/24/17 0738 02/24/17 1147  GLUCAP 101* 107* 116* 130* 125*   Lipid Profile: No results for input(s): CHOL, HDL, LDLCALC, TRIG, CHOLHDL, LDLDIRECT in the last 72 hours. Thyroid Function Tests: No results for input(s): TSH, T4TOTAL, FREET4, T3FREE, THYROIDAB in the last 72 hours. Anemia Panel: No  results for input(s): VITAMINB12, FOLATE, FERRITIN, TIBC, IRON, RETICCTPCT in the last 72 hours. Urine analysis:    Component Value Date/Time   COLORURINE AMBER (A) 02/16/2017 0100   APPEARANCEUR HAZY (A) 02/16/2017 0100   LABSPEC 1.026 02/16/2017 0100   PHURINE 5.0 02/16/2017 0100   GLUCOSEU NEGATIVE 02/16/2017 0100   HGBUR NEGATIVE 02/16/2017 0100   BILIRUBINUR NEGATIVE 02/16/2017 0100   KETONESUR NEGATIVE 02/16/2017 0100   PROTEINUR NEGATIVE 02/16/2017 0100   UROBILINOGEN 0.2 05/11/2013 1903   NITRITE NEGATIVE 02/16/2017 0100   LEUKOCYTESUR NEGATIVE 02/16/2017 0100   Sepsis Labs: _1 (procalcitonin:4,lacticidven:4)  )  Recent Results (from the past 240 hour(s))  Blood culture (routine x 2)     Status: None   Collection Time: 02/15/17  8:33 PM  Result Value Ref Range Status   Specimen Description LEFT ANTECUBITAL  Final   Special Requests   Final    BOTTLES DRAWN AEROBIC AND ANAEROBIC Blood Culture adequate volume   Culture NO GROWTH 5 DAYS  Final   Report Status 02/20/2017 FINAL  Final  Blood culture (routine x 2)     Status: None   Collection Time: 02/15/17  8:33 PM  Result Value Ref Range Status   Specimen Description BLOOD LEFT FOREARM  Final   Special Requests   Final    BOTTLES DRAWN AEROBIC AND ANAEROBIC Blood Culture adequate volume   Culture NO GROWTH 5 DAYS  Final   Report Status 02/20/2017 FINAL  Final  MRSA PCR Screening     Status: None   Collection Time: 02/16/17  4:36 AM  Result Value Ref Range Status   MRSA by PCR NEGATIVE NEGATIVE Final    Comment:        The GeneXpert MRSA Assay (FDA approved for NASAL specimens only), is one component of a comprehensive MRSA colonization surveillance program. It is not intended to diagnose MRSA infection nor to guide or monitor treatment for MRSA infections.   Aerobic/Anaerobic Culture (surgical/deep wound)     Status: None   Collection Time: 02/16/17  9:25 AM  Result Value Ref Range Status   Specimen  Description ABSCESS  Final   Special Requests LEFT BUTTOCK  Final   Gram Stain   Final    FEW WBC PRESENT, PREDOMINANTLY MONONUCLEAR FEW GRAM POSITIVE COCCI IN PAIRS RARE GRAM NEGATIVE RODS RARE GRAM POSITIVE RODS    Culture   Final    FEW VIRIDANS STREPTOCOCCUS MIXED ANAEROBIC FLORA PRESENT.  CALL LAB IF FURTHER IID REQUIRED.    Report Status 02/21/2017 FINAL  Final         Radiology Studies: No results found.      Scheduled Meds: . ampicillin-sulbactam (UNASYN) IV  3 g Intravenous Q8H  . budesonide (PULMICORT) nebulizer solution  0.5 mg Nebulization BID  . chlorhexidine gluconate (MEDLINE KIT)  15 mL Mouth Rinse BID  . Chlorhexidine Gluconate Cloth  6 each Topical Q0600  . enoxaparin (LOVENOX) injection  100 mg Subcutaneous Q24H  . furosemide  40 mg Intravenous BID  . ipratropium  0.5 mg Nebulization Q6H  . magnesium oxide  400 mg Oral BID  . mouth rinse  15 mL Mouth Rinse QID  . pantoprazole (PROTONIX) IV  40 mg Intravenous QHS  . potassium chloride  40 mEq Oral TID  . [START ON 02/25/2017] senna-docusate  1 tablet Per Tube Daily  . sodium chloride flush  10-40 mL Intracatheter Q12H   Continuous Infusions: . dextrose 30 mL/hr at 02/23/17 0749     LOS: 8 days    Time spent: [place], MD [facility] ([facility]   If 7PM-7AM, please contact night-coverage [email or web address] Password [facility] 02/24/2017, 3:22 PM

## 2017-02-24 NOTE — Progress Notes (Addendum)
SATURATION QUALIFICATIONS: (This note is used to comply with regulatory documentation for home oxygen)  Patient Saturations on Room Air at Rest = 92%  Patient Saturations on Room Air while Ambulating = pt in bed   Patient Saturations on 0 Liters of oxygen while Ambulating = pt in bed   Please briefly explain why patient needs home oxygen:  Pt O2 saturation on RA while at rest is 92% however when pt is asleep and takes off oxygen, O2 saturation drops to 85-88%.

## 2017-02-24 NOTE — Progress Notes (Signed)
[facility] Progress Note  5 Days Post-Op  Subjective: CC: thigh wound Currently receiving dressing change. Pain controlled. Complaining of loose, watery stools that leak around rectal tube with coughing.  Objective: Vital signs in last 24 hours: Temp:  [98.1 F (36.7 C)-98.8 F (37.1 C)] 98.3 F (36.8 C) (04/14 0820) Pulse Rate:  [86-95] 95 (04/14 0820) Resp:  [18-20] 20 (04/14 0820) BP: (120-147)/(49-71) 147/71 (04/14 0820) SpO2:  [93 %-95 %] 95 % (04/14 0918) Last BM Date: 02/23/17  Intake/Output from previous day: 04/13 0701 - 04/14 0700 In: 1449.5 [P.O.:440; I.V.:909.5; IV Piggyback:100] Out: 5926 [Urine:5675; Stool:251] Intake/Output this shift: Total I/O In: 240 [P.O.:240] Out: -   PE: Gen:  Alert, NAD, pleasant Buttock: Skin: Skin color, texture, turgor normal. No rashes or lesions. Wound >95% granulation tissue, bleeding and healing appropriately.  Lab Results:   Recent Labs  02/23/17 0507 02/24/17 0457  WBC 21.9* 18.1*  HGB 8.2* 8.3*  HCT 26.1* 27.1*  PLT 343 345   BMET  Recent Labs  02/23/17 0507 02/24/17 0457  NA 142 140  K 2.5* 3.0*  CL 100* 98*  CO2 32 31  GLUCOSE 103* 120*  BUN 19 14  CREATININE 0.96 0.97  CALCIUM 8.0* 8.2*   PT/INR No results for input(s): LABPROT, INR in the last 72 hours. CMP     Component Value Date/Time   NA 140 02/24/2017 0457   K 3.0 (L) 02/24/2017 0457   CL 98 (L) 02/24/2017 0457   CO2 31 02/24/2017 0457   GLUCOSE 120 (H) 02/24/2017 0457   BUN 14 02/24/2017 0457   CREATININE 0.97 02/24/2017 0457   CALCIUM 8.2 (L) 02/24/2017 0457   PROT 6.5 02/24/2017 0457   ALBUMIN 1.9 (L) 02/24/2017 0457   AST 20 02/24/2017 0457   ALT 20 02/24/2017 0457   ALKPHOS 63 02/24/2017 0457   BILITOT 0.5 02/24/2017 0457   GFRNONAA >60 02/24/2017 0457   GFRAA >60 02/24/2017 0457   Lipase     Component Value Date/Time   LIPASE 24 05/11/2013 1912       Studies/Results: No results  found.  Anti-infectives: Anti-infectives    Start     Dose/Rate Route Frequency Ordered Stop   02/23/17 0800  Ampicillin-Sulbactam (UNASYN) 3 g in sodium chloride 0.9 % 100 mL IVPB     3 g 200 mL/hr over 30 Minutes Intravenous Every 8 hours 02/23/17 0504     02/22/17 1200  Ampicillin-Sulbactam (UNASYN) 3 g in sodium chloride 0.9 % 100 mL IVPB  Status:  Discontinued     3 g 200 mL/hr over 30 Minutes Intravenous Every 8 hours 02/22/17 1149 02/23/17 0504   02/16/17 2200  clindamycin (CLEOCIN) IVPB 900 mg  Status:  Discontinued     900 mg 100 mL/hr over 30 Minutes Intravenous Every 8 hours 02/16/17 1418 02/20/17 0959   02/16/17 0600  clindamycin (CLEOCIN) IVPB 600 mg  Status:  Discontinued     600 mg 100 mL/hr over 30 Minutes Intravenous Every 8 hours 02/16/17 0352 02/16/17 1418   02/16/17 0400  piperacillin-tazobactam (ZOSYN) IVPB 3.375 g  Status:  Discontinued     3.375 g 12.5 mL/hr over 240 Minutes Intravenous Every 8 hours 02/16/17 0400 02/22/17 1148   02/16/17 0400  vancomycin (VANCOCIN) 2,000 mg in sodium chloride 0.9 % 500 mL IVPB  Status:  Discontinued     2,000 mg 250 mL/hr over 120 Minutes Intravenous Every 24 hours 02/16/17 0400 02/19/17 0917   02/15/17 2145  clindamycin (CLEOCIN) IVPB 600 mg     600 mg 100 mL/hr over 30 Minutes Intravenous  Once 02/15/17 2140 02/15/17 2347   02/15/17 2000  piperacillin-tazobactam (ZOSYN) IVPB 3.375 g     3.375 g 100 mL/hr over 30 Minutes Intravenous  Once 02/15/17 1955 02/15/17 2131   02/15/17 2000  vancomycin (VANCOCIN) IVPB 1000 mg/200 mL premix     1,000 mg 200 mL/hr over 60 Minutes Intravenous  Once 02/15/17 1955 02/15/17 2348     Assessment/Plan S/P debridement of gluteal wound  02/16/17 Dr. Dalbert Batman S/P dressing change under anesthesia- 02/19/17 Dr Donne Hazel - continue daily dressing changes, no need for further OR debridement - continue flexiseal, placement of colostomy not recommended in the setting of MMP  - primary team has asked  for surgery team to determine when the patients wound is amenable to consult/intervention by plastic surgery   Will decrease senna/miralax due to watery stool to help decrease leakage    LOS: 8 days    Jill Alexanders , [facility] 02/24/2017, 11:01 AM Pager: [phone] Consults: [phone] Mon-Fri 7:00 am-4:30 pm Sat-Sun 7:00 am-11:30 am

## 2017-02-25 LAB — GLUCOSE, CAPILLARY
GLUCOSE-CAPILLARY: 111 mg/dL — AB (ref 65–99)
GLUCOSE-CAPILLARY: 124 mg/dL — AB (ref 65–99)
Glucose-Capillary: 133 mg/dL — ABNORMAL HIGH (ref 65–99)

## 2017-02-25 MED ORDER — ONDANSETRON HCL 4 MG/2ML IJ SOLN
4.0000 mg | Freq: Four times a day (QID) | INTRAMUSCULAR | Status: DC | PRN
  Administered 2017-02-25 – 2017-03-02 (×9): 4 mg via INTRAVENOUS
  Filled 2017-02-25 (×10): qty 2

## 2017-02-25 MED ORDER — PANTOPRAZOLE SODIUM 40 MG PO TBEC
40.0000 mg | DELAYED_RELEASE_TABLET | Freq: Every day | ORAL | Status: DC
  Administered 2017-02-25 – 2017-03-08 (×12): 40 mg via ORAL
  Filled 2017-02-25 (×14): qty 1

## 2017-02-25 MED ORDER — AMOXICILLIN-POT CLAVULANATE 875-125 MG PO TABS
1.0000 | ORAL_TABLET | Freq: Two times a day (BID) | ORAL | Status: AC
Start: 2017-02-25 — End: 2017-03-01
  Administered 2017-02-25 – 2017-03-01 (×10): 1 via ORAL
  Filled 2017-02-25 (×10): qty 1

## 2017-02-25 NOTE — Consult Note (Signed)
[facility] Nurse wound consult note Reason for Consult: Intertriginous dermatitis (ITD) in the bilateral inframammary and subpannicular skin folds secondary to moisture accumulation. NO evidence of fungal overgrowth at this time Wound type: Moisture associated skin damage Pressure Injury POA: No Measurement:Bilateral inframammary and subpannicular spaces, diffuse, no fungal overgrowth.  Pinpoint open areas Wound KMQ:KMMN, moist Drainage (amount, consistency, odor) scant serous, mild musty odor Periwound:intact, macerated Dressing procedure/placement/frequency:I will provide Nursing with guidance via the orders for use of the product InterDry Ag+ for intertriginous dermatitis.  This moisture wicking, antimicrobial textile will be used in house and 1 package will be sent home so that patient might use as the weather becomes warmer.  She can also obtain this as a consumer without a prescription should she so choose post discharge. [place] nursing team will not follow, but will remain available to this patient, the nursing and medical teams.  Please re-consult if needed. Thanks, Maudie Flakes, MSN, RN, [place], Arther Abbott  Pager# [phone]

## 2017-02-25 NOTE — Progress Notes (Signed)
6 Days Post-Op  Subjective: Alert.  Afebrile.  Pain well controlled. Wound clean and granulating per RN. Stools seem to be slowing down.  Objective: Vital signs in last 24 hours: Temp:  [98 F (36.7 C)-98.7 F (37.1 C)] 98.7 F (37.1 C) (04/15 0830) Pulse Rate:  [88-103] 96 (04/15 0830) Resp:  [18-20] 18 (04/15 0830) BP: (131-146)/(63-78) 131/73 (04/15 0830) SpO2:  [91 %-100 %] 92 % (04/15 0842) Last BM Date: 02/23/17  Intake/Output from previous day: 04/14 0701 - 04/15 0700 In: 1420 [P.O.:960; I.V.:360; IV Piggyback:100] Out: 5250 [Urine:5250] Intake/Output this shift: Total I/O In: 240 [P.O.:240] Out: 750 [Urine:750]    PE: Gen:  Alert, NAD, pleasant Buttock: Skin: Skin color, texture, turgor normal. No rashes or lesions. Wound >95% granulation tissue, bleeding and healing appropriately.   Lab Results:  Results for orders placed or performed during the hospital encounter of 02/15/17 (from the past 24 hour(s))  Glucose, capillary     Status: Abnormal   Collection Time: 02/24/17  4:40 PM  Result Value Ref Range   Glucose-Capillary 162 (H) 65 - 99 mg/dL  Glucose, capillary     Status: Abnormal   Collection Time: 02/24/17  8:20 PM  Result Value Ref Range   Glucose-Capillary 115 (H) 65 - 99 mg/dL  Glucose, capillary     Status: Abnormal   Collection Time: 02/25/17  1:06 AM  Result Value Ref Range   Glucose-Capillary 111 (H) 65 - 99 mg/dL  Glucose, capillary     Status: Abnormal   Collection Time: 02/25/17  4:18 AM  Result Value Ref Range   Glucose-Capillary 124 (H) 65 - 99 mg/dL  Glucose, capillary     Status: Abnormal   Collection Time: 02/25/17  8:18 AM  Result Value Ref Range   Glucose-Capillary 133 (H) 65 - 99 mg/dL   Comment 1 Notify RN    Comment 2 Document in Chart      Studies/Results: No results found.  Marland Kitchen ampicillin-sulbactam (UNASYN) IV  3 g Intravenous Q8H  . budesonide (PULMICORT) nebulizer solution  0.5 mg Nebulization BID  . chlorhexidine  gluconate (MEDLINE KIT)  15 mL Mouth Rinse BID  . Chlorhexidine Gluconate Cloth  6 each Topical Q0600  . enoxaparin (LOVENOX) injection  100 mg Subcutaneous Q24H  . furosemide  40 mg Intravenous BID  . ipratropium  0.5 mg Nebulization TID  . magnesium oxide  400 mg Oral BID  . mouth rinse  15 mL Mouth Rinse QID  . pantoprazole  40 mg Oral QHS  . potassium chloride  40 mEq Oral TID  . senna-docusate  1 tablet Per Tube Daily  . sodium chloride flush  10-40 mL Intracatheter Q12H     Assessment/Plan: s/p Procedure(s): DEBRIDEMENT OF GLUTEAL WOUND  S/P debridement of gluteal wound  02/16/17 Dr. Dalbert Batman S/P dressing change under anesthesia- 02/19/17 Dr Donne Hazel - continue BID  dressing changes, no need for further OR debridement - continue flexiseal, placement of colostomy not recommended in the setting of superobese morbid obesity,  MMP   -We will ask for plastic surgery consultation on Monday to optimize inpatient and outpatient wound care  decreased senna/miralax due to watery stool to help decrease leakage   _0 @  LOS: 9 days    Nelissa Bolduc M 02/25/2017  . .prob

## 2017-02-25 NOTE — Progress Notes (Signed)
PROGRESS NOTE    Megan Ortiz  BJY:[id number] DOB: 21-Nov-1975 DOA: 02/15/2017 PCP: No PCP Per Patient  Outpatient Specialists:     Brief Narrative:  89 Smoker Bipolar Reflux Iron deficiency anemia Migraine Pulmonary embolism Asthma Super obesity BMI 70 Migraine  Admitted by critical care 02/16/2017 -necrotizing wound of left buttock, admit with MODS [ hypotension, tachycardia, MAXIMUM TEMPERATURE 101, needing sepsis protocol, started on Levaquin 5] History of present illness 13/0.6--31/2.4 and admission Lactic acidosis 4.6, white count 42 hemoglobin 8.5 Transferred for Gen. surgery for debridement-wound grew strep viridans mixed anaerobic flora Eventually diuresed after initial volume resuscitation  Assessment & Plan:   Principal Problem:   Necrotizing soft tissue infection Active Problems:   Septic shock ([place])   Acute hypoxemic respiratory failure ([place])   AKI (acute kidney injury) ([place])   Acute type 1/type II respiratory failure secondary to sepsis , underlying diagnosis of asthma and prior smoker   Extubated 4/11 but on 4 L-nebulizer with Pulmicort and Atrovent-no need steroids currently--sounds clear to chest  Buttock abscess/Necrotizing fasciitis with strep viridans, divided 4/6, 02/19/17 + hospital-acquired pneumonia  Continue unasyn started 4/11 [zosyn 4/5-->4/11]. Last x-ray 4/11 and small effusion.  Monitor-would taper Abx for standard therapy of Aspiraiton 14 days ending 4/19 --de-escalate to PO abx 24-48 hr, Augmentin 4/15 WBc progressively down from 40 on admit-->18.  Get cbc am  Acute kidney injury-secondary to sepsis physiology Prior hypernatremia Hypokalemia secondary to diuresis  Initially treated with bicarbonate to 4/10 volume resuscitated. Then diuresed. See below.  increasing Kdur 40 bid to TID-labs am,  Magnesium 1.5-started mag ox 40 bid-rpt labs with Mag in am  Probable underlying congestive heart failure versus OHSS as well as pulmonary  hypertension  Currently on IV Lasix 40 twice a day. Baseline weight =204 kg 08/2016 and was 204 on arrival to ED 02/15/17.  She is now 231.  Needs relatively aggressive diuresiss need continued diuresis to get to goal weight of about 200 Kg. Creatinine has paradoxically improved since stopping fluid and diuresis  Anemia secondary to sepsis, blood loss after surgery expected  Transfused 4/9. Stable at present time  Iatrogenic diarrhea  Need to keep stool loose.  Keep flexi-seal-high risk for complications-cutting back laxatives given incontinence as per gen surg  [place] of OHSS  Monitor for ncoturnal/sleeping O2 sat  Elevated cbg without diagnosis DM  Holding Dextrose.  Hold CBG   Full code Tele Inpatient pending resolution-- plastic surgery 4/16? input for a flap    Consultants:   surgery  Procedures:   none  Antimicrobials:   Zosyn  unasyn    Subjective:  Pleasant Dressing changes-in pain-wound directly visualized-clean bloody Have asked patient to follow 1500 fluid restriction    Objective: Vitals:   02/24/17 2016 02/25/17 0517 02/25/17 0830 02/25/17 0842  BP:  137/78 131/73   Pulse:  (!) 103 96   Resp:  18 18   Temp:  98 F (36.7 C) 98.7 F (37.1 C)   TempSrc:  Oral Oral   SpO2: 100% 91% 95% 92%  Weight:      Height:        Intake/Output Summary (Last 24 hours) at 02/25/17 1159 Last data filed at 02/25/17 0945  Gross per 24 hour  Intake             1420 ml  Output             4750 ml  Net            -  3330 ml   Filed Weights   02/22/17 0500 02/22/17 2042  Weight: (!) 217.3 kg (479 lb) (!) 231.3 kg (510 lb)    Examination:  Alert Body mass index is 87.54 kg/m.  No ict no pallor cta b Wound clean, granulating well.  Flexi-seal in place loose stool on oxygen s1 s 2no m/r/g  No le edeam     Data Reviewed: I have personally reviewed following labs and imaging studies  CBC:  Recent Labs Lab 02/20/17 0308 02/21/17 0342  02/22/17 0315 02/23/17 0507 02/24/17 0457  WBC 22.7* 24.1* 23.3* 21.9* 18.1*  HGB 7.6* 7.9* 7.6* 8.2* 8.3*  HCT 24.2* 25.2* 25.2* 26.1* 27.1*  MCV 85.5 86.0 88.1 88.5 88.3  PLT 320 351 355 343 333   Basic Metabolic Panel:  Recent Labs Lab 02/18/17 1712  02/19/17 0454 02/19/17 1615 02/20/17 0308 02/21/17 0342 02/21/17 1400 02/22/17 0315 02/23/17 0507 02/24/17 0457  NA  --   < > 141  --  144 146* 151* 151* 142 140  K  --   < > 3.2*  --  3.5 2.6* 2.8* 2.9* 2.5* 3.0*  CL  --   < > 110  --  111 110 110 111 100* 98*  CO2  --   < > 20*  --  _0 32 32 31  GLUCOSE  --   < > 99  --  107* 118* 118* 98 103* 120*  BUN  --   < > 40*  --  40* 34* 32* 25* 19 14  CREATININE  --   < > 1.70*  --  1.46* 1.27* 1.25* 1.21* 0.96 0.97  CALCIUM  --   < > 7.8*  --  7.9* 8.3* 8.5* 8.2* 8.0* 8.2*  MG 2.1  --  2.1 2.0 2.1 1.8  --   --   --  1.5*  PHOS 2.4*  --  2.8 4.0 4.9* 3.3  --   --   --   --   < > = values in this interval not displayed. GFR: Estimated Creatinine Clearance: 151 mL/min (by C-G formula based on SCr of 0.97 mg/dL). Liver Function Tests:  Recent Labs Lab 02/24/17 0457  AST 20  ALT 20  ALKPHOS 63  BILITOT 0.5  PROT 6.5  ALBUMIN 1.9*   No results for input(s): LIPASE, AMYLASE in the last 168 hours. No results for input(s): AMMONIA in the last 168 hours. Coagulation Profile: No results for input(s): INR, PROTIME in the last 168 hours. Cardiac Enzymes: No results for input(s): CKTOTAL, CKMB, CKMBINDEX, TROPONINI in the last 168 hours. BNP (last 3 results) No results for input(s): PROBNP in the last 8760 hours. HbA1C: No results for input(s): HGBA1C in the last 72 hours. CBG:  Recent Labs Lab 02/24/17 1640 02/24/17 2020 02/25/17 0106 02/25/17 0418 02/25/17 0818  GLUCAP 162* 115* 111* 124* 133*   Lipid Profile: No results for input(s): CHOL, HDL, LDLCALC, TRIG, CHOLHDL, LDLDIRECT in the last 72 hours. Thyroid Function Tests: No results for input(s): TSH,  T4TOTAL, FREET4, T3FREE, THYROIDAB in the last 72 hours. Anemia Panel: No results for input(s): VITAMINB12, FOLATE, FERRITIN, TIBC, IRON, RETICCTPCT in the last 72 hours. Urine analysis:    Component Value Date/Time   COLORURINE AMBER (A) 02/16/2017 0100   APPEARANCEUR HAZY (A) 02/16/2017 0100   LABSPEC 1.026 02/16/2017 0100   PHURINE 5.0 02/16/2017 0100   GLUCOSEU NEGATIVE 02/16/2017 0100   HGBUR NEGATIVE 02/16/2017 0100   BILIRUBINUR NEGATIVE 02/16/2017 0100  KETONESUR NEGATIVE 02/16/2017 0100   PROTEINUR NEGATIVE 02/16/2017 0100   UROBILINOGEN 0.2 05/11/2013 1903   NITRITE NEGATIVE 02/16/2017 0100   LEUKOCYTESUR NEGATIVE 02/16/2017 0100   Sepsis Labs: _0 (procalcitonin:4,lacticidven:4)  ) Recent Results (from the past 240 hour(s))  Blood culture (routine x 2)     Status: None   Collection Time: 02/15/17  8:33 PM  Result Value Ref Range Status   Specimen Description LEFT ANTECUBITAL  Final   Special Requests   Final    BOTTLES DRAWN AEROBIC AND ANAEROBIC Blood Culture adequate volume   Culture NO GROWTH 5 DAYS  Final   Report Status 02/20/2017 FINAL  Final  Blood culture (routine x 2)     Status: None   Collection Time: 02/15/17  8:33 PM  Result Value Ref Range Status   Specimen Description BLOOD LEFT FOREARM  Final   Special Requests   Final    BOTTLES DRAWN AEROBIC AND ANAEROBIC Blood Culture adequate volume   Culture NO GROWTH 5 DAYS  Final   Report Status 02/20/2017 FINAL  Final  MRSA PCR Screening     Status: None   Collection Time: 02/16/17  4:36 AM  Result Value Ref Range Status   MRSA by PCR NEGATIVE NEGATIVE Final    Comment:        The GeneXpert MRSA Assay (FDA approved for NASAL specimens only), is one component of a comprehensive MRSA colonization surveillance program. It is not intended to diagnose MRSA infection nor to guide or monitor treatment for MRSA infections.   Aerobic/Anaerobic Culture (surgical/deep wound)     Status: None    Collection Time: 02/16/17  9:25 AM  Result Value Ref Range Status   Specimen Description ABSCESS  Final   Special Requests LEFT BUTTOCK  Final   Gram Stain   Final    FEW WBC PRESENT, PREDOMINANTLY MONONUCLEAR FEW GRAM POSITIVE COCCI IN PAIRS RARE GRAM NEGATIVE RODS RARE GRAM POSITIVE RODS    Culture   Final    FEW VIRIDANS STREPTOCOCCUS MIXED ANAEROBIC FLORA PRESENT.  CALL LAB IF FURTHER IID REQUIRED.    Report Status 02/21/2017 FINAL  Final         Radiology Studies: No results found.      Scheduled Meds: . ampicillin-sulbactam (UNASYN) IV  3 g Intravenous Q8H  . budesonide (PULMICORT) nebulizer solution  0.5 mg Nebulization BID  . chlorhexidine gluconate (MEDLINE KIT)  15 mL Mouth Rinse BID  . Chlorhexidine Gluconate Cloth  6 each Topical Q0600  . enoxaparin (LOVENOX) injection  100 mg Subcutaneous Q24H  . furosemide  40 mg Intravenous BID  . ipratropium  0.5 mg Nebulization TID  . magnesium oxide  400 mg Oral BID  . mouth rinse  15 mL Mouth Rinse QID  . pantoprazole  40 mg Oral QHS  . potassium chloride  40 mEq Oral TID  . senna-docusate  1 tablet Per Tube Daily  . sodium chloride flush  10-40 mL Intracatheter Q12H   Continuous Infusions: . dextrose 30 mL/hr at 02/24/17 1838     LOS: 9 days    Time spent: [place], MD [facility] ([facility]   If 7PM-7AM, please contact night-coverage [email or web address] Password [email or web address] 02/25/2017, 11:59 AM

## 2017-02-25 NOTE — Progress Notes (Signed)
Paged MD to inform of vomiting episode, no Prn order for nausea/vomiting.   Will continue to monitor.  Paulla Fore, RN

## 2017-02-26 LAB — COMPREHENSIVE METABOLIC PANEL
ALBUMIN: 2.2 g/dL — AB (ref 3.5–5.0)
ALT: 17 U/L (ref 14–54)
ANION GAP: 11 (ref 5–15)
AST: 21 U/L (ref 15–41)
Alkaline Phosphatase: 59 U/L (ref 38–126)
BILIRUBIN TOTAL: 0.4 mg/dL (ref 0.3–1.2)
BUN: 12 mg/dL (ref 6–20)
CO2: 30 mmol/L (ref 22–32)
Calcium: 9.2 mg/dL (ref 8.9–10.3)
Chloride: 97 mmol/L — ABNORMAL LOW (ref 101–111)
Creatinine, Ser: 1.09 mg/dL — ABNORMAL HIGH (ref 0.44–1.00)
GFR calc Af Amer: >60 mL/min (ref >60)
GFR calc non Af Amer: >60 mL/min (ref >60)
GLUCOSE: 117 mg/dL — AB (ref 65–99)
POTASSIUM: 3.6 mmol/L (ref 3.5–5.1)
SODIUM: 138 mmol/L (ref 135–145)
TOTAL PROTEIN: 7 g/dL (ref 6.5–8.1)

## 2017-02-26 LAB — CBC
HCT: 27.4 % — ABNORMAL LOW (ref 36.0–46.0)
HEMOGLOBIN: 8.6 g/dL — AB (ref 12.0–15.0)
MCH: 27.9 pg (ref 26.0–34.0)
MCHC: 31.4 g/dL (ref 30.0–36.0)
MCV: 89 fL (ref 78.0–100.0)
Platelets: 365 10*3/uL (ref 150–400)
RBC: 3.08 MIL/uL — AB (ref 3.87–5.11)
RDW: 18 % — ABNORMAL HIGH (ref 11.5–15.5)
WBC: 15.6 10*3/uL — ABNORMAL HIGH (ref 4.0–10.5)

## 2017-02-26 LAB — MAGNESIUM: Magnesium: 1.9 mg/dL (ref 1.7–2.4)

## 2017-02-26 LAB — GLUCOSE, CAPILLARY
Glucose-Capillary: 137 mg/dL — ABNORMAL HIGH (ref 65–99)
Glucose-Capillary: 103 mg/dL — ABNORMAL HIGH (ref 65–99)

## 2017-02-26 LAB — PREALBUMIN: PREALBUMIN: 17.2 mg/dL — AB (ref 18–38)

## 2017-02-26 MED ORDER — ENSURE ENLIVE PO LIQD
237.0000 mL | Freq: Three times a day (TID) | ORAL | Status: DC
  Administered 2017-02-26 – 2017-03-08 (×26): 237 mL via ORAL

## 2017-02-26 MED ORDER — IPRATROPIUM-ALBUTEROL 0.5-2.5 (3) MG/3ML IN SOLN: 3.0000 mL | RESPIRATORY_TRACT | Status: DC | PRN

## 2017-02-26 MED ORDER — ACETAMINOPHEN 160 MG/5ML PO SOLN
1000.0000 mg | Freq: Three times a day (TID) | ORAL | Status: DC
  Administered 2017-02-26: 650 mg via ORAL
  Administered 2017-02-27 – 2017-03-04 (×15): 1000 mg via ORAL
  Filled 2017-02-26 (×16): qty 40.6

## 2017-02-26 MED ORDER — SERTRALINE HCL 100 MG PO TABS
100.0000 mg | ORAL_TABLET | Freq: Two times a day (BID) | ORAL | Status: DC
  Administered 2017-02-26 – 2017-03-09 (×22): 100 mg via ORAL
  Filled 2017-02-26 (×22): qty 1

## 2017-02-26 MED ORDER — CHLORHEXIDINE GLUCONATE 0.12 % MT SOLN
OROMUCOSAL | Status: AC
  Administered 2017-02-26: 15 mL
  Filled 2017-02-26: qty 15

## 2017-02-26 MED ORDER — OXYCODONE HCL 5 MG PO TABS
5.0000 mg | ORAL_TABLET | ORAL | Status: DC | PRN
  Administered 2017-02-26 – 2017-02-28 (×9): 10 mg via ORAL
  Administered 2017-02-28 (×2): 5 mg via ORAL
  Administered 2017-03-01 – 2017-03-02 (×7): 10 mg via ORAL
  Administered 2017-03-03: 5 mg via ORAL
  Administered 2017-03-03 – 2017-03-07 (×15): 10 mg via ORAL
  Administered 2017-03-07: 5 mg via ORAL
  Administered 2017-03-07 – 2017-03-09 (×5): 10 mg via ORAL
  Filled 2017-02-26 (×18): qty 2
  Filled 2017-02-26: qty 1
  Filled 2017-02-26: qty 2
  Filled 2017-02-26: qty 1
  Filled 2017-02-26 (×5): qty 2
  Filled 2017-02-26: qty 1
  Filled 2017-02-26 (×13): qty 2

## 2017-02-26 NOTE — Progress Notes (Signed)
PROGRESS NOTE    Deby Adger  CHE:[id number] DOB: 04/12/1976 DOA: 02/15/2017 PCP: No PCP Per Patient  Outpatient Specialists:     Brief Narrative:  34 Smoker Bipolar Reflux Iron deficiency anemia Migraine Pulmonary embolism Asthma Super obesity BMI 70 Migraine  Admitted by critical care 02/16/2017 -necrotizing wound of left buttock, admit with MODS [ hypotension, tachycardia, MAXIMUM TEMPERATURE 101, needing sepsis protocol, started on Levaquin 5] History of present illness 13/0.6--31/2.4 and admission Lactic acidosis 4.6, white count 42 hemoglobin 8.5 Transferred for Gen. surgery for debridement-wound grew strep viridans mixed anaerobic flora Eventually diuresed after initial volume resuscitation  Assessment & Plan:   Principal Problem:   Necrotizing soft tissue infection Active Problems:   Septic shock ([place])   Acute hypoxemic respiratory failure ([place])   AKI (acute kidney injury) ([place])   Acute type 1/type II respiratory failure secondary to sepsis , underlying diagnosis of asthma and prior smoker   Extubated 4/11 but on 4 L-nebulizer with Pulmicort and Atrovent-no need steroids currently--sounds clear to chest Not needing oxygen right now  Buttock abscess/Necrotizing fasciitis with strep viridans, divided 4/6, 02/19/17 + hospital-acquired pneumonia  Continue unasyn started 4/11 [zosyn 4/5-->4/11]. Last x-ray 4/11 and small effusion.  Monitor-would taper Abx for standard therapy of Aspiraiton 14 days ending 4/19 de-escalated Augmentin 4/15 WBc progressively down from 40 on admit-->18-->15.  Acute kidney injury-secondary to sepsis physiology Prior hypernatremia Hypokalemia secondary to diuresis  Initially treated with bicarbonate to 4/10 volume resuscitated. Then diuresed. See below.  increasing Kdur 40 bid to TID-labs am,  Magnesium 1.5-started mag ox 40 bid  Probable underlying congestive heart failure versus OHSS as well as pulmonary hypertension  Currently on IV  Lasix 40 twice a day. Baseline weight =204 kg 08/2016 and was 204 on arrival to ED 02/15/17.  She is now 231.  Needs relatively aggressive diuresiss need continued diuresis to get to goal weight of about 200 Kg. Creatinine has paradoxically improved since stopping fluid and diuresis Needs daily wights-not done for past 3-4 d  Anemia secondary to sepsis, blood loss after surgery expected  Transfused 4/9. Stable at present time  Iatrogenic diarrhea  Need to keep stool loose.  Keep flexi-seal-high risk for complications-cutting back laxatives given incontinence as per gen surg  [place] of OHSS  Monitor for ncoturnal/sleeping O2 sat  Elevated cbg without diagnosis DM  Holding Dextrose.  Hold CBG   Full code Tele Inpatient pending resolution-- plastic surgery 4/16? input for a flap    Consultants:   surgery  Procedures:   none  Antimicrobials:   Zosyn  unasyn   Augemtnin 4/15--->4/19   Subjective:  Fair No acute pain watching Ipad No fever   Objective: Vitals:   02/25/17 1830 02/25/17 2005 02/26/17 0516 02/26/17 0926  BP: (!) 107/59  (!) 125/59 130/64  Pulse: 96  82 94  Resp: '17  17 18  ' Temp: 98.5 F (36.9 C)  98.4 F (36.9 C) 97.6 F (36.4 C)  TempSrc: Oral  Oral Oral  SpO2: 90% 90% 95% 92%  Weight:      Height:        Intake/Output Summary (Last 24 hours) at 02/26/17 1047 Last data filed at 02/26/17 0700  Gross per 24 hour  Intake             1000 ml  Output             3925 ml  Net            -2925 ml  Filed Weights   02/22/17 0500 02/22/17 2042  Weight: (!) 217.3 kg (479 lb) (!) 231.3 kg (510 lb)    Examination:  Alert Body mass index is 87.54 kg/m.  No ict no pallor cta b Wound clean, granulating well.  Flexi-seal in place loose stool on oxygen s1 s 2no m/r/g  No le edeam     Data Reviewed: I have personally reviewed following labs and imaging studies  CBC:  Recent Labs Lab 02/21/17 0342 02/22/17 0315 02/23/17 0507  02/24/17 0457 02/26/17 0452  WBC 24.1* 23.3* 21.9* 18.1* 15.6*  HGB 7.9* 7.6* 8.2* 8.3* 8.6*  HCT 25.2* 25.2* 26.1* 27.1* 27.4*  MCV 86.0 88.1 88.5 88.3 89.0  PLT 351 355 343 345 315   Basic Metabolic Panel:  Recent Labs Lab 02/19/17 1615  02/20/17 0308 02/21/17 0342 02/21/17 1400 02/22/17 0315 02/23/17 0507 02/24/17 0457 02/26/17 0452  NA  --   < > 144 146* 151* 151* 142 140 138  K  --   < > 3.5 2.6* 2.8* 2.9* 2.5* 3.0* 3.6  CL  --   < > 111 110 110 111 100* 98* 97*  CO2  --   < > '23 28 31 ' 32 32 31 30  GLUCOSE  --   < > 107* 118* 118* 98 103* 120* 117*  BUN  --   < > 40* 34* 32* 25* '19 14 12  ' CREATININE  --   < > 1.46* 1.27* 1.25* 1.21* 0.96 0.97 1.09*  CALCIUM  --   < > 7.9* 8.3* 8.5* 8.2* 8.0* 8.2* 9.2  MG 2.0  --  2.1 1.8  --   --   --  1.5* 1.9  PHOS 4.0  --  4.9* 3.3  --   --   --   --   --   < > = values in this interval not displayed. GFR: Estimated Creatinine Clearance: 134.4 mL/min (A) (by C-G formula based on SCr of 1.09 mg/dL (H)). Liver Function Tests:  Recent Labs Lab 02/24/17 0457 02/26/17 0452  AST 20 21  ALT 20 17  ALKPHOS 63 59  BILITOT 0.5 0.4  PROT 6.5 7.0  ALBUMIN 1.9* 2.2*   No results for input(s): LIPASE, AMYLASE in the last 168 hours. No results for input(s): AMMONIA in the last 168 hours. Coagulation Profile: No results for input(s): INR, PROTIME in the last 168 hours. Cardiac Enzymes: No results for input(s): CKTOTAL, CKMB, CKMBINDEX, TROPONINI in the last 168 hours. BNP (last 3 results) No results for input(s): PROBNP in the last 8760 hours. HbA1C: No results for input(s): HGBA1C in the last 72 hours. CBG:  Recent Labs Lab 02/24/17 2020 02/25/17 0106 02/25/17 0418 02/25/17 0818 02/26/17 0750  GLUCAP 115* 111* 124* 133* 137*   Lipid Profile: No results for input(s): CHOL, HDL, LDLCALC, TRIG, CHOLHDL, LDLDIRECT in the last 72 hours. Thyroid Function Tests: No results for input(s): TSH, T4TOTAL, FREET4, T3FREE, THYROIDAB  in the last 72 hours. Anemia Panel: No results for input(s): VITAMINB12, FOLATE, FERRITIN, TIBC, IRON, RETICCTPCT in the last 72 hours. Urine analysis:    Component Value Date/Time   COLORURINE AMBER (A) 02/16/2017 0100   APPEARANCEUR HAZY (A) 02/16/2017 0100   LABSPEC 1.026 02/16/2017 0100   PHURINE 5.0 02/16/2017 0100   GLUCOSEU NEGATIVE 02/16/2017 0100   HGBUR NEGATIVE 02/16/2017 0100   BILIRUBINUR NEGATIVE 02/16/2017 0100   KETONESUR NEGATIVE 02/16/2017 0100   PROTEINUR NEGATIVE 02/16/2017 0100   UROBILINOGEN 0.2 05/11/2013 1903  NITRITE NEGATIVE 02/16/2017 0100   LEUKOCYTESUR NEGATIVE 02/16/2017 0100   Sepsis Labs: '@LABRCNTIP' (procalcitonin:4,lacticidven:4)  ) No results found for this or any previous visit (from the past 240 hour(s)).       Radiology Studies: No results found.      Scheduled Meds: . acetaminophen (TYLENOL) oral liquid 160 mg/5 mL  1,000 mg Oral Q8H  . amoxicillin-clavulanate  1 tablet Oral Q12H  . budesonide (PULMICORT) nebulizer solution  0.5 mg Nebulization BID  . chlorhexidine gluconate (MEDLINE KIT)  15 mL Mouth Rinse BID  . Chlorhexidine Gluconate Cloth  6 each Topical Q0600  . enoxaparin (LOVENOX) injection  100 mg Subcutaneous Q24H  . furosemide  40 mg Intravenous BID  . ipratropium  0.5 mg Nebulization TID  . mouth rinse  15 mL Mouth Rinse QID  . pantoprazole  40 mg Oral QHS  . potassium chloride  40 mEq Oral TID  . senna-docusate  1 tablet Per Tube Daily   Continuous Infusions:    LOS: 10 days    Time spent: [place], MD [facility] ([facility]   If 7PM-7AM, please contact night-coverage [email or web address] Password [facility] 02/26/2017, 10:47 AM

## 2017-02-26 NOTE — Progress Notes (Signed)
Nutrition Follow-up  DOCUMENTATION CODES:   Morbid obesity  INTERVENTION:  Provide Ensure Enlive po TID, each supplement provides 350 kcal and 20 grams of protein.  Encourage adequate PO intake.   NUTRITION DIAGNOSIS:   Increased nutrient needs related to wound healing as evidenced by estimated needs; ongoing  GOAL:   Patient will meet greater than or equal to 90% of their needs; progressing  MONITOR:   PO intake, Supplement acceptance, Labs, Weight trends, Skin, I & O's  REASON FOR ASSESSMENT:   Consult Enteral/tube feeding initiation and management  ASSESSMENT:   41 year old female, PMHx anemia, anxiety/depression, gastric ulcer, HTN, PE and super/morbid obesity. Presented to ED with hypotension, tachycardia and fever. Had few days reported poor PO intake/diarrhea. Found to have septic shock 2/2 enlarging necrotizing wound to buttocks s/p surgical debridement 4/6.  Extubated 4/11.  Pt is currently on a regular diet. Meal completion has been 50-75%. Pt reports she is full vegan however willing and agreeable to consume milk and egg products for increased protein needs to aid in wound healing. RD to order Ensure to aid in caloric and protein needs. Labs and medications reviewed.   Diet Order:  Diet regular Room service appropriate? Yes; Fluid consistency: Thin; Fluid restriction: 1200 mL Fluid  Skin:  Wound (see comment) (Buttock abscess/Necrotizing fasciitis)  Last BM:  4/15  Height:   Ht Readings from Last 1 Encounters:  02/22/17 5\' 4"  (1.626 m)    Weight:   Wt Readings from Last 1 Encounters:  05/11/13 (!) 450 lb (204.1 kg)    Ideal Body Weight:  56.82 kg  BMI:  Body mass index is 87.54 kg/m.  Estimated Nutritional Needs:   Kcal:  2000-2300  Protein:  115-130 grams  Fluid:  1.2 L/day  EDUCATION NEEDS:   Education needs addressed  Corrin Parker, MS, RD, LDN Pager # [phone] After hours/ weekend pager # [phone]

## 2017-02-26 NOTE — Progress Notes (Signed)
Physical Therapy Treatment Patient Details Name: Megan Ortiz MRN: [id number] DOB: 28-Nov-1975 Today's Date: 02/26/2017    History of Present Illness 41 year old female with PMH of anemia, anxiety/depression, asthma, gastric ulcer, HTN, PE, and morbid obesity. Presents to [facility] ED on 4/5 with hypotension, tachycardia, and temp of 101.5. States that she has had a few days of diarrhea and decreased oral intake. Upon arrival to ED it was revealed that patient had an enlarging necrotizing wound of the buttocks (Patient states she first noted an "blister" about two days ago).  Transfered from [facility] to [facility] for further management.  Post I and D of gluteal wound.  Intubated post op, Extubated 4/11.    PT Comments    Pain continues to limit activity tolerance; Will pan to time our ensuing PT sessions not as close to dressing changes; Even with that pain, Megan Ortiz was able to pull to semi-long sit in the bed for brief periods of time;   Took extra time to talk through her PLOF, and how she gets in and out of bed on a normal day; Megan Ortiz does a lot of bed mobility/positioning in bed prone at home (including sleeping); will consider exiting the bed in a prone position next session; Will also consider using the lift to get to [facility], than working on transfers and walking from that chair;   Megan Ortiz was completely independent and walking without an assistive device prior to this hospitalization; if we can manage her pain, I believe walking is achievable;   Took time to validate pt's frustration with the situation, and gain some rapport, with goal of increasing pt's participation in her care; she is sad at the current situation and doesn't feel like herself; I encouraged her to get her husband to bring in her dentures and her makeup, so she could feel more like herself;   Dr. Verlon Au: Megan Ortiz has some questions about her medications for her depression.    Follow Up Recommendations  SNF;Other  (comment) (will work toward [facility] or HHPT)     Clinical biochemist with 5" wheels (bari)    Recommendations for Other Services OT consult;Other (comment) (Will hopefully make some functional progress over next few sessions and then we can request a Rehab Consult)     Precautions / Restrictions Precautions Precautions: Fall    Mobility  Bed Mobility Overal bed mobility: Needs Assistance Bed Mobility: Rolling Rolling: Mod assist;+2 for physical assistance         General bed mobility comments: pt needed repositioning and boosting up toward [facility] while bed was non functional.  Rolled to add a draw sheet to assist with further rolling and positioning in the bed; Megan Ortiz is able to briefly pull shoulders and upper trunk up to almost long sitting for very brief periods of time; also able to attain a L semi-sidelying elbow prop position of relative comfort in the bed  Transfers                 General transfer comment: pt unable to make it all the way to EOB due to buttock pain.  Ambulation/Gait                 Stairs            Wheelchair Mobility    Modified Rankin (Stroke Patients Only)       Balance  Cognition Arousal/Alertness: Awake/alert Behavior During Therapy: Anxious Overall Cognitive Status: Within Functional Limits for tasks assessed                                        Exercises      General Comments        Pertinent Vitals/Pain Pain Assessment: Faces Faces Pain Scale: Hurts whole lot Pain Location: L buttock Pain Descriptors / Indicators: Burning;Grimacing;Moaning Pain Intervention(s): Limited activity within patient's tolerance;Patient requesting pain meds-RN notified;RN gave pain meds during session    Home Living                      Prior Function            PT Goals (current goals can now be found in the care  plan section) Acute Rehab PT Goals Patient Stated Goal: home PT Goal Formulation: With patient Time For Goal Achievement: 03/09/17 Potential to Achieve Goals: Good Progress towards PT goals: Progressing toward goals (slowly)    Frequency    Min 3X/week      PT Plan Current plan remains appropriate    Co-evaluation             End of Session   Activity Tolerance: Patient limited by pain Patient left: in bed;with call bell/phone within reach Nurse Communication: Mobility status;Other (comment) (pt has questions about her depression meds) PT Visit Diagnosis: Other abnormalities of gait and mobility (R26.89);Muscle weakness (generalized) (M62.81);Difficulty in walking, not elsewhere classified (R26.2)     Time: 8182-9937 PT Time Calculation (min) (ACUTE ONLY): 37 min  Charges:  $Therapeutic Activity: 23-37 mins                    G Codes:       Megan Ortiz, PT  Acute Rehabilitation Services Pager [phone] Office [phone]    Megan Ortiz 02/26/2017, 1:03 PM

## 2017-02-26 NOTE — Consult Note (Signed)
Reason for Consult: thigh wound Referring Physician: Dr. Leane Para Date: 4.16.18 Location: [facility]  Megan Ortiz is an 41 y.o. female.  HPI: Morbidly obese female transferred from [facility] on 4.6.18 for necrotiizing infection posterior thigh. Underwent debridement and culture with S. Viridans and anaerobic. On Augmentin orally at this time. No history DM. Prealbumin 17.2. Prior to admission, states independent living with husband, who is dialysis dependent. Has not been OOB since admission. Has rectal tube in place, Foley in place. Has had intermittent diarrhea since admission, surgery has discussed ostomy but given obesity high risk with this. Plastic surgery consulted for coverage. Current wet to dry bid.  Past Medical History:  Diagnosis Date  . Anemia   . Anxiety   . Asthma   . Gastric ulcer   . Headache   . Hiatal hernia   . Hypertension   . PE (pulmonary embolism)   . Tachycardia     Past Surgical History:  Procedure Laterality Date  . CESAREAN SECTION    . CHOLECYSTECTOMY    . INCISION AND DRAINAGE ABSCESS Left 02/16/2017   Procedure: DEBRIDEMENT LEFT BUTTOCK ABSCESS;  Surgeon: Fanny Skates, MD;  Location: [facility];  Service: General;  Laterality: Left;  . IRRIGATION AND DEBRIDEMENT BUTTOCKS Left 02/19/2017   Procedure: DEBRIDEMENT OF GLUTEAL WOUND;  Surgeon: Rolm Bookbinder, MD;  Location: [facility] OR;  Service: General;  Laterality: Left;    Family History  Problem Relation Age of Onset  . Depression Mother   . Alcohol abuse Mother     Social History:  reports that she has quit smoking. She has never used smokeless tobacco. She reports that she drinks alcohol. She reports that she does not use drugs.  Allergies:  Allergies  Allergen Reactions  . Aspirin     Due to hx of stomach ulcers    Medications: I have reviewed the patient's current medications.  Results for orders placed or performed during the hospital encounter of 02/15/17 (from the past 48 hour(s))   Glucose, capillary     Status: Abnormal   Collection Time: 02/24/17  4:40 PM  Result Value Ref Range   Glucose-Capillary 162 (H) 65 - 99 mg/dL  Glucose, capillary     Status: Abnormal   Collection Time: 02/24/17  8:20 PM  Result Value Ref Range   Glucose-Capillary 115 (H) 65 - 99 mg/dL  Glucose, capillary     Status: Abnormal   Collection Time: 02/25/17  1:06 AM  Result Value Ref Range   Glucose-Capillary 111 (H) 65 - 99 mg/dL  Glucose, capillary     Status: Abnormal   Collection Time: 02/25/17  4:18 AM  Result Value Ref Range   Glucose-Capillary 124 (H) 65 - 99 mg/dL  Glucose, capillary     Status: Abnormal   Collection Time: 02/25/17  8:18 AM  Result Value Ref Range   Glucose-Capillary 133 (H) 65 - 99 mg/dL   Comment 1 Notify RN    Comment 2 Document in Chart   Comprehensive metabolic panel     Status: Abnormal   Collection Time: 02/26/17  4:52 AM  Result Value Ref Range   Sodium 138 135 - 145 mmol/L   Potassium 3.6 3.5 - 5.1 mmol/L   Chloride 97 (L) 101 - 111 mmol/L   CO2 30 22 - 32 mmol/L   Glucose, Bld 117 (H) 65 - 99 mg/dL   BUN 12 6 - 20 mg/dL   Creatinine, Ser 1.09 (H) 0.44 - 1.00 mg/dL  Calcium 9.2 8.9 - 10.3 mg/dL   Total Protein 7.0 6.5 - 8.1 g/dL   Albumin 2.2 (L) 3.5 - 5.0 g/dL   AST 21 15 - 41 U/L   ALT 17 14 - 54 U/L   Alkaline Phosphatase 59 38 - 126 U/L   Total Bilirubin 0.4 0.3 - 1.2 mg/dL   GFR calc non Af Amer >60 >60 mL/min   GFR calc Af Amer >60 >60 mL/min    Comment: (NOTE) The eGFR has been calculated using the CKD EPI equation. This calculation has not been validated in all clinical situations. eGFR's persistently <60 mL/min signify possible Chronic Kidney Disease.    Anion gap 11 5 - 15  Magnesium     Status: None   Collection Time: 02/26/17  4:52 AM  Result Value Ref Range   Magnesium 1.9 1.7 - 2.4 mg/dL  CBC     Status: Abnormal   Collection Time: 02/26/17  4:52 AM  Result Value Ref Range   WBC 15.6 (H) 4.0 - 10.5 K/uL   RBC 3.08  (L) 3.87 - 5.11 MIL/uL   Hemoglobin 8.6 (L) 12.0 - 15.0 g/dL   HCT 27.4 (L) 36.0 - 46.0 %   MCV 89.0 78.0 - 100.0 fL   MCH 27.9 26.0 - 34.0 pg   MCHC 31.4 30.0 - 36.0 g/dL   RDW 18.0 (H) 11.5 - 15.5 %   Platelets 365 150 - 400 K/uL  Glucose, capillary     Status: Abnormal   Collection Time: 02/26/17  7:50 AM  Result Value Ref Range   Glucose-Capillary 137 (H) 65 - 99 mg/dL  Prealbumin     Status: Abnormal   Collection Time: 02/26/17  8:33 AM  Result Value Ref Range   Prealbumin 17.2 (L) 18 - 38 mg/dL   ROS Blood pressure 130/64, pulse 94, temperature 97.6 F (36.4 C), temperature source Oral, resp. rate 18, height _0  (1.626 m), weight (!) 231.3 kg (510 lb), last menstrual period 02/01/2017, SpO2 92 %. Physical Exam  Alert, NAD, morbidly obese Left posterior thigh at gluteal crease with approximate open wound 11 x 19 cm with approximately 80 % granulation wound and some minor stool contamination from around rectal tube, no cellulitis Pain with dressing change, moderate drainage on dressing  Assessment/Plan: HbA1c pending.  Counseled patient options healing by secondary intention, attempt to close wound primarily, or skin graft. Latter two options would require surgery. Her habitus, the location wound adjacent to anus, and hygiene will limit her ability to heal or have uncomplicated surgery. Reviewed process of skin graft- reviewed use of VAC or bolster. Given above, I am concerned STSG may not take well as it will be difficult to have VAC placement in this area. Reviewed donor site pain and healing.  Alternative of local wound care- from surgical description has had significant improvement with regards to depth wound already. I expect she would require at least daily dressing change-this includes if she had surgery for closure or graft- and it does not appear her family would be able to assist with this. Will obtain nutrition consult for supplement.   At this time will observe with  local wound care, wrote orders to change to silver alginate daily and PRN contamination. Will check in 3-4 days- revisit skin graft. If patient is ready for discharge at any time this week, I can follow as OP.   Irene Limbo, MD [facility] Plastic [facility] Reconstructive Surgery [phone], pin [phone]

## 2017-02-26 NOTE — Progress Notes (Signed)
[facility] Progress Note  7 Days Post-Op  Subjective: CC: intermittent gluteal pain, gastric reflux Pts pain is improving, currently only taking IV fentanyl for pain. She reports gastric reflux and usually takes nexium BID at home. She reports associated nausea/vomiting yesterday. Denies fever/chills. Patient nervous about the possibility of undergoing general anesthesia if plastic surgery recommends skin graft.  Objective: Vital signs in last 24 hours: Temp:  [97.6 F (36.4 C)-98.5 F (36.9 C)] 97.6 F (36.4 C) (04/16 0926) Pulse Rate:  [82-98] 94 (04/16 0926) Resp:  [17-18] 18 (04/16 0926) BP: (107-130)/(59-64) 130/64 (04/16 0926) SpO2:  [90 %-96 %] 92 % (04/16 0926) Last BM Date: 02/25/17  Intake/Output from previous day: 04/15 0701 - 04/16 0700 In: 1240 [P.O.:980; I.V.:20] Out: 4675 [Urine:4575; Stool:100] Intake/Output this shift: No intake/output data recorded.  PE: Gen:  Alert, NAD, pleasant Card:  Regular rate and rhythm Pulm:  Normal respiratory effort Abd: Soft, obese, non-tender, non-distended  Lab Results:   Recent Labs  02/24/17 0457 02/26/17 0452  WBC 18.1* 15.6*  HGB 8.3* 8.6*  HCT 27.1* 27.4*  PLT 345 365   BMET  Recent Labs  02/24/17 0457 02/26/17 0452  NA 140 138  K 3.0* 3.6  CL 98* 97*  CO2 31 30  GLUCOSE 120* 117*  BUN 14 12  CREATININE 0.97 1.09*  CALCIUM 8.2* 9.2   PT/INR No results for input(s): LABPROT, INR in the last 72 hours. CMP     Component Value Date/Time   NA 138 02/26/2017 0452   K 3.6 02/26/2017 0452   CL 97 (L) 02/26/2017 0452   CO2 30 02/26/2017 0452   GLUCOSE 117 (H) 02/26/2017 0452   BUN 12 02/26/2017 0452   CREATININE 1.09 (H) 02/26/2017 0452   CALCIUM 9.2 02/26/2017 0452   PROT 7.0 02/26/2017 0452   ALBUMIN 2.2 (L) 02/26/2017 0452   AST 21 02/26/2017 0452   ALT 17 02/26/2017 0452   ALKPHOS 59 02/26/2017 0452   BILITOT 0.4 02/26/2017 0452   GFRNONAA >60 02/26/2017 0452   GFRAA >60  02/26/2017 0452   Lipase     Component Value Date/Time   LIPASE 24 05/11/2013 1912       Studies/Results: No results found.  Anti-infectives: Anti-infectives    Start     Dose/Rate Route Frequency Ordered Stop   02/25/17 1300  amoxicillin-clavulanate (AUGMENTIN) 875-125 MG per tablet 1 tablet     1 tablet Oral Every 12 hours 02/25/17 1208     02/23/17 0800  Ampicillin-Sulbactam (UNASYN) 3 g in sodium chloride 0.9 % 100 mL IVPB  Status:  Discontinued     3 g 200 mL/hr over 30 Minutes Intravenous Every 8 hours 02/23/17 0504 02/25/17 1208   02/22/17 1200  Ampicillin-Sulbactam (UNASYN) 3 g in sodium chloride 0.9 % 100 mL IVPB  Status:  Discontinued     3 g 200 mL/hr over 30 Minutes Intravenous Every 8 hours 02/22/17 1149 02/23/17 0504   02/16/17 2200  clindamycin (CLEOCIN) IVPB 900 mg  Status:  Discontinued     900 mg 100 mL/hr over 30 Minutes Intravenous Every 8 hours 02/16/17 1418 02/20/17 0959   02/16/17 0600  clindamycin (CLEOCIN) IVPB 600 mg  Status:  Discontinued     600 mg 100 mL/hr over 30 Minutes Intravenous Every 8 hours 02/16/17 0352 02/16/17 1418   02/16/17 0400  piperacillin-tazobactam (ZOSYN) IVPB 3.375 g  Status:  Discontinued     3.375 g 12.5 mL/hr over 240 Minutes Intravenous Every 8  hours 02/16/17 0400 02/22/17 1148   02/16/17 0400  vancomycin (VANCOCIN) 2,000 mg in sodium chloride 0.9 % 500 mL IVPB  Status:  Discontinued     2,000 mg 250 mL/hr over 120 Minutes Intravenous Every 24 hours 02/16/17 0400 02/19/17 0917   02/15/17 2145  clindamycin (CLEOCIN) IVPB 600 mg     600 mg 100 mL/hr over 30 Minutes Intravenous  Once 02/15/17 2140 02/15/17 2347   02/15/17 2000  piperacillin-tazobactam (ZOSYN) IVPB 3.375 g     3.375 g 100 mL/hr over 30 Minutes Intravenous  Once 02/15/17 1955 02/15/17 2131   02/15/17 2000  vancomycin (VANCOCIN) IVPB 1000 mg/200 mL premix     1,000 mg 200 mL/hr over 60 Minutes Intravenous  Once 02/15/17 1955 02/15/17 2348      Assessment/Plan s/p Procedure(s): DEBRIDEMENT OF GLUTEAL WOUND  S/P debridement of gluteal wound 02/16/17 Dr. Dalbert Batman S/P dressing change under anesthesia- 02/19/17 Dr Donne Hazel - continue BID  dressing changes, no need for further OR debridement - continue flexiseal, placement of colostomy not recommended in the setting of morbid obesity,  MMP   Plan: spoke with Dr. Iran Planas this AM, she agrees to see patient in consultation Schedule tylenol and add PRN oxycodone to maximize PO pain control and decrease frequency of IV fentanyl     LOS: 10 days    Jill Alexanders , [facility] 02/26/2017, 9:55 AM Pager: [phone] Consults: [phone] Mon-Fri 7:00 am-4:30 pm Sat-Sun 7:00 am-11:30 am

## 2017-02-27 DIAGNOSIS — J45909 Unspecified asthma, uncomplicated: Secondary | ICD-10-CM

## 2017-02-27 DIAGNOSIS — F4323 Adjustment disorder with mixed anxiety and depressed mood: Secondary | ICD-10-CM

## 2017-02-27 DIAGNOSIS — R739 Hyperglycemia, unspecified: Secondary | ICD-10-CM

## 2017-02-27 DIAGNOSIS — I10 Essential (primary) hypertension: Secondary | ICD-10-CM

## 2017-02-27 DIAGNOSIS — D72829 Elevated white blood cell count, unspecified: Secondary | ICD-10-CM

## 2017-02-27 DIAGNOSIS — D62 Acute posthemorrhagic anemia: Secondary | ICD-10-CM

## 2017-02-27 LAB — HEMOGLOBIN A1C
HEMOGLOBIN A1C: 5.4 % (ref 4.8–5.6)
MEAN PLASMA GLUCOSE: 108 mg/dL

## 2017-02-27 LAB — GLUCOSE, CAPILLARY: Glucose-Capillary: 116 mg/dL — ABNORMAL HIGH (ref 65–99)

## 2017-02-27 MED ORDER — ALUM & MAG HYDROXIDE-SIMETH 200-200-20 MG/5ML PO SUSP
15.0000 mL | ORAL | Status: DC | PRN
  Administered 2017-03-05 – 2017-03-06 (×2): 15 mL via ORAL
  Filled 2017-02-27 (×2): qty 30

## 2017-02-27 MED ORDER — POLYETHYLENE GLYCOL 3350 17 G PO PACK: 17.0000 g | PACK | Freq: Every day | ORAL | 0 refills | Status: DC | PRN

## 2017-02-27 MED ORDER — SENNOSIDES-DOCUSATE SODIUM 8.6-50 MG PO TABS: 1.0000 | ORAL_TABLET | Freq: Every day | ORAL | Status: DC

## 2017-02-27 MED ORDER — PANTOPRAZOLE SODIUM 40 MG PO TBEC: 40.0000 mg | DELAYED_RELEASE_TABLET | Freq: Every day | ORAL | 0 refills | Status: DC

## 2017-02-27 MED ORDER — OXYCODONE HCL 5 MG PO TABS: 5.0000 mg | ORAL_TABLET | ORAL | 0 refills | Status: DC | PRN

## 2017-02-27 MED ORDER — FUROSEMIDE 40 MG PO TABS: 40.0000 mg | ORAL_TABLET | Freq: Two times a day (BID) | ORAL | Status: DC

## 2017-02-27 NOTE — Progress Notes (Signed)
Pt wound is stable for pt discharge to [facility] or recommended facility from surgical standpoint. She may follow up with plastic surgery for further wound care.   Obie Dredge, PA-C [facility] Pager: [phone] Consults: [phone] Mon-Fri 7:00 am-4:30 pm Sat-Sun 7:00 am-11:30 am

## 2017-02-27 NOTE — Progress Notes (Signed)
Physical Therapy Treatment Patient Details Name: Megan Ortiz MRN: [id number] DOB: 1976/05/14 Today's Date: 02/27/2017    History of Present Illness 41 year old female with PMH of anemia, anxiety/depression, asthma, gastric ulcer, HTN, PE, and morbid obesity. Presents to [facility] ED on 4/5 with hypotension, tachycardia, and temp of 101.5. States that she has had a few days of diarrhea and decreased oral intake. Upon arrival to ED it was revealed that patient had an enlarging necrotizing wound of the buttocks (Patient states she first noted an "blister" about two days ago).  [facility]fered [facility] for further management.  Post I and D of gluteal wound.  Intubated post op, Extubated 4/11.    PT Comments    Continuing work on functional mobility and activity tolerance; Megan Ortiz stood today X 2 and took pivotal steps with RW to recliner; Megan Ortiz to walk  next session; she is young and shows good rehab potential; Husband can give assist at home   Follow Up Recommendations  SNF;Other (comment) (will work toward [employer] or HHPT)     Clinical biochemist with 5" wheels (Bari)    Recommendations for Other Services       Precautions / Restrictions Precautions Precautions: Fall    Mobility  Bed Mobility Overal bed mobility: Needs Assistance Bed Mobility: Supine to Sit     Supine to sit: Mod assist Sit to supine: Max assist;+2 for safety/equipment   General bed mobility comments: Good effort once Mariena decided to get up; Light mod assist to help LEs to clear EOB; max assist to help LEs back in bed to lay down  Transfers Overall transfer level: Needs assistance Equipment used: Rolling walker (2 wheeled) Transfers: Sit to/from Stand Sit to Stand: Min assist;+2 safety/equipment         General transfer comment: Min assist to steady RW during transition as she tends to pull up on RW; stood from bed and chair; very close guard for  safety  Ambulation/Gait Ambulation/Gait assistance: +2 safety/equipment;Min assist Ambulation Distance (Feet):  (Pivotal steps bed to recliner and then back to bed) Assistive device: Rolling walker (2 wheeled) Gait Pattern/deviations: Shuffle     General Gait Details: Cues to self-monitor for activity tolerance; close guard for safety   Stairs            Wheelchair Mobility    Modified Rankin (Stroke Patients Only)       Balance                                            Cognition Arousal/Alertness: Awake/alert Behavior During Therapy: WFL for tasks assessed/performed;Anxious Overall Cognitive Status: Within Functional Limits for tasks assessed                                        Exercises      General Comments General comments (skin integrity, edema, etc.): Did not tolerate sitting in chair and near immediately asked to get back up and back to bed due to buttock pain with sitting      Pertinent Vitals/Pain Pain Assessment: Faces Faces Pain Scale: Hurts little more Pain Location: L buttock Pain Descriptors / Indicators: Grimacing Pain Intervention(s): Monitored during session;Patient requesting pain meds-RN notified;RN gave pain meds during session    Home  Living                      Prior Function            PT Goals (current goals can now be found in the care plan section) Acute Rehab PT Goals Patient Stated Goal: home PT Goal Formulation: With patient Time For Goal Achievement: 03/09/17 Potential to Achieve Goals: Good Progress towards PT goals: Progressing toward goals    Frequency    Min 3X/week      PT Plan Current plan remains appropriate    Co-evaluation             End of Session   Activity Tolerance: Patient limited by pain Patient left: in bed;with call bell/phone within reach Nurse Communication: Mobility status PT Visit Diagnosis: Other abnormalities of gait and mobility  (R26.89);Muscle weakness (generalized) (M62.81);Difficulty in walking, not elsewhere classified (R26.2)     Time: 9242-6834 PT Time Calculation (min) (ACUTE ONLY): 32 min  Charges:  $Gait Training: 8-22 mins $Therapeutic Activity: 8-22 mins                    G Codes:       Roney Marion, PT  Acute Rehabilitation Services Pager [phone] Office [place] 02/27/2017, 4:50 PM

## 2017-02-27 NOTE — Evaluation (Signed)
Occupational Therapy Evaluation Patient Details Name: Megan Ortiz MRN: [id number] DOB: Sep 25, 1976 Today's Date: 02/27/2017    History of Present Illness 41 year old female with PMH of anemia, anxiety/depression, asthma, gastric ulcer, HTN, PE, and morbid obesity. Presents to [facility] ED on 4/5 with hypotension, tachycardia, and temp of 101.5. States that she has had a few days of diarrhea and decreased oral intake. Upon arrival to ED it was revealed that patient had an enlarging necrotizing wound of the buttocks (Patient states she first noted an "blister" about two days ago).  Transfered from [facility] to [facility] for further management.  Post I and D of gluteal wound.  Intubated post op, Extubated 4/11.   Clinical Impression   PTA, pt was living with her husband and was independent with ADLs and IADLs. Pt currently requires Mod A for UB ADLs at bed level and Min A +2 to transfer to recliner using Bari-RW. Pt demonstrates motivation to participate in therapy and would benefit from intense therapy to increase her independence and return to PLOF. Will continue to follow acutely to facilitate safe dc and progress towards established goals. Recommend dc to CIR for further OT to increase independence in ADLs and functional mobility as well as decreased caregiver burden.     Follow Up Recommendations  CIR    Equipment Recommendations  Other (comment) (Defer to next venue)    Recommendations for Other Services Rehab consult     Precautions / Restrictions Precautions Precautions: Fall      Mobility Bed Mobility Overal bed mobility: Needs Assistance Bed Mobility: Supine to Sit;Rolling;Sit to Supine Rolling: Mod assist;+2 for physical assistance   Supine to sit: Mod assist Sit to supine: Max assist;+2 for physical assistance;+2 for safety/equipment   General bed mobility comments: Good effort once Megan Ortiz decided to get up; Light mod assist to help LEs to clear EOB; max assist to help LEs back in  bed to lay down  Transfers Overall transfer level: Needs assistance Equipment used: Rolling walker (2 wheeled) Megan Ortiz Petit) Transfers: Sit to/from [employer] to Stand: [employer] safety/equipment         General transfer comment: Min assist to steady RW during transition as she tends to pull up on RW; stood from bed and chair; very close guard for safety    Balance Overall balance assessment: Needs assistance Sitting-balance support: Bilateral upper extremity supported Sitting balance-Leahy Scale: Poor Sitting balance - Comments: Leaned on Bari-RW for support while at EOB   Standing balance support: During functional activity;Bilateral upper extremity supported Standing balance-Leahy Scale: Poor Standing balance comment: Reliant on RW for UE support                           ADL either performed or assessed with clinical judgement   ADL Overall ADL's : Needs assistance/impaired Eating/Feeding: Set up;Bed level   Grooming: Set up;Bed level Grooming Details (indicate cue type and reason): Pt declined to perform grooming at this time Upper Body Bathing: Moderate assistance;Bed level   Lower Body Bathing: Total assistance;Bed level   Upper Body Dressing : Moderate assistance;Bed level   Lower Body Dressing: Total assistance;Bed level Lower Body Dressing Details (indicate cue type and reason): Pt reports that she wears dresses and converse shoes when at home and does not wear LB clothes.    Toilet Transfer Details (indicate cue type and reason): Flexiseal           General ADL Comments: Pt demonstrated  increased activity tolerance to perform bed mobility and stand pivot transfer to bari-recliner.      Vision         Perception     Praxis      Pertinent Vitals/Pain Pain Assessment: Faces Faces Pain Scale: Hurts little more Pain Location: L buttock Pain Descriptors / Indicators: Grimacing Pain Intervention(s): Monitored during session     Hand  Dominance     Extremity/Trunk Assessment Upper Extremity Assessment Upper Extremity Assessment: Overall WFL for tasks assessed   Lower Extremity Assessment Lower Extremity Assessment: Defer to PT evaluation       Communication Communication Communication: No difficulties   Cognition Arousal/Alertness: Awake/alert Behavior During Therapy: WFL for tasks assessed/performed;Anxious Overall Cognitive Status: Within Functional Limits for tasks assessed                                     General Comments  Did not tolerate sitting in chair and near immediately asked to get back up and back to bed due to buttock pain with sitting    Exercises     Shoulder Instructions      Home Living Family/patient expects to be discharged to:: Private residence Living Arrangements: Spouse/significant other Available Help at Discharge: Family;Available 24 hours/day Type of Home: House Home Access: Stairs to enter     Home Layout: One level     Bathroom Shower/Tub: Corporate investment banker: Standard     Home Equipment: Cane - single point          Prior Functioning/Environment Level of Independence: Independent        Comments: Did her ADL's, cooked, light cleaning, no AD, Stayed home most of time        OT Problem List: Decreased strength;Decreased range of motion;Decreased activity tolerance;Impaired balance (sitting and/or standing);Pain;Decreased knowledge of use of DME or AE;Decreased safety awareness      OT Treatment/Interventions: Self-care/ADL training;Therapeutic exercise;Energy conservation;DME and/or AE instruction;Therapeutic activities;Patient/family education    OT Goals(Current goals can be found in the care plan section) Acute Rehab OT Goals Patient Stated Goal: To go home and be normal again OT Goal Formulation: With patient Time For Goal Achievement: 03/13/17 Potential to Achieve Goals: Good ADL Goals Pt Will Perform  Grooming: with set-up;with supervision;standing Pt Will Perform Upper Body Bathing: with min assist;sitting Pt Will Perform Lower Body Bathing: with mod assist;sit to/from stand Pt Will Perform Upper Body Dressing: with min assist;sitting Pt Will Transfer to Toilet: with min assist;bedside commode;ambulating  OT Frequency: Min 2X/week   Barriers to D/C:            Co-evaluation              End of Session Equipment Utilized During Treatment: Surveyor, mining Communication: Mobility status;Other (comment) (Wound care needed)  Activity Tolerance: Patient tolerated treatment well Patient left: in bed;with call bell/phone within reach;with nursing/sitter in room  OT Visit Diagnosis: Unsteadiness on feet (R26.81);Pain;Muscle weakness (generalized) (M62.81) Pain - Right/Left:  (Buttocks) Pain - part of body:  (Buttocks)                Time: 7124-5809 OT Time Calculation (min): 31 min Charges:  OT General Charges $OT Visit: 1 Procedure OT Evaluation $OT Eval Moderate Complexity: 1 Procedure G-Codes:     Tallia Moehring, OTR/L [phone]  [place] 02/27/2017, 5:09 PM

## 2017-02-27 NOTE — Discharge Summary (Addendum)
Physician Discharge Summary  Megan Ortiz FFM:[id number] DOB: 08/25/1976 DOA: 02/15/2017  PCP: No PCP Per Patient  Admit date: 02/15/2017 Discharge date: 02/27/2017  Time spent: 45 minutes  Recommendations for Outpatient Follow-up:  1. Wound care-Silver Alginate dressings as an OP to area of buttocks  2. Will follow with Plastic surgery as Per AVS  3. Stopped Lisinopril/Cardizem and aldactone this admission 4. Keep Flexiseal in and keep stools loose with mirlax and senna until area of wound does heal 5. New meds of percocet and aldactone this admit  Discharge Diagnoses:  Principal Problem:   Necrotizing soft tissue infection Active Problems:   Septic shock ([place])   Acute hypoxemic respiratory failure (H[facility])   AKI (acute kidney injury) ([place])   Benign essential HTN   Uncomplicated asthma   Super-super obese (HCC)   Adjustment disorder with mixed anxiety and depressed mood   Hyperglycemia   Leukocytosis   Acute blood loss anemia   Discharge Condition: imporved  Diet recommendation:  hh low salt  Filed Weights    History of present illness:  56 Smoker Bipolar Reflux Iron deficiency anemia Migraine Pulmonary embolism Asthma Super obesity BMI 70 Migraine  Admitted by critical care 02/16/2017 -necrotizing wound of left buttock, admit with MODS [hypotension, tachycardia, MAXIMUM TEMPERATURE 101, needing sepsis protocol, started on Levaquin 5] History of present illness 13/0.6--31/2.4 and admission Lactic acidosis 4.6, white count 42 hemoglobin 8.5 Transferred for Gen. surgery for debridement-wound grew strep viridans mixed anaerobic flora Eventually diuresed after initial volume resuscitation  Hospital Course:   Acute type 1/type II respiratory failure secondary to sepsis , underlying diagnosis of asthma and prior smoker.             Extubated 4/11 but on 4 L during initial recovery-weaned to off-nebulizer with Pulmicort and Atrovent-no need steroids currently--sounds  clear to chest Not needing oxygen right now  Buttock abscess/Necrotizing fasciitis with strep viridans, divided 4/6, 02/19/17 + hospital-acquired pneumonia             Continue unasyn started 4/11 [zosyn 4/5-->4/11]. Last x-ray 4/11 and small effusion.  Monitor-would taper Abx for standard therapy of Aspiraiton 14 days ending 4/19 de-escalated Augmentin 4/15 WBc progressively down from 40 on admit-->18-->15. Dress with Silver Alginate daily and whenever contaminated Keep Flexiseal in until reasonably healed over Dr. Iran Planas has indicated would be willing to see patient in 3-4 days at OP follow up and is aware of impending d/c to skilled facility  Acute kidney injury-secondary to sepsis physiology/Iatrogenic 2/2 to ACE/Aldactone Prior hypernatremia Hypokalemia secondary to diuresis             Initially treated with bicarbonate to 4/10 volume resuscitated. Then diuresed. See below.  Increasing Kdur 40 bid to TID-labs am,  Magnesium 1.5-started mag ox 40 bid Follow as OP  Probable underlying congestive heart failure versus OHSS as well as pulmonary hypertension             was on IV Lasix 40 twice a day. Baseline weight =204 kg 08/2016 and was 204 on arrival to ED 02/15/17.  She is now 231.  Needs continued diuresis to get to goal weight of about 200 Kg. Creatinine has paradoxically improved since stopping fluid and diuresis Needs daily wights- at facility  Anemia secondary to sepsis, blood loss after surgery expected             Transfused 4/9. Stable at present time  Iatrogenic diarrhea  Need to keep stool loose.  Keep flexi-seal-high risk for complications-cutting back laxatives given incontinence as per gen surg  [place] of OHSS             Monitor for ncoturnal/sleeping O2 sat  Elevated cbg without diagnosis DM             Holding Dextrose.  Hold CBG  Procedures:   Nec l gluteal space/L pararectal space debridement   Consultations:  Gen Surgery  Plastic  surgery  Discharge Exam: Vitals:   02/27/17 0534 02/27/17 1019  BP: (!) 126/58 132/66  Pulse: 65 (!) 104  Resp: 18 17  Temp: 98.2 F (36.8 C) 98 F (36.7 C)    General: eomi ncat Cardiovascular: s1 s 2no m/r/g Respiratory: clear no added sound, no rales nor rhonchi And soft nt nd nd Wound not reviewed today  Discharge Instructions    Current Discharge Medication List    CONTINUE these medications which have NOT CHANGED   Details  acetaminophen (TYLENOL) 500 MG tablet Take 500 mg by mouth every 6 (six) hours as needed for moderate pain.    albuterol (VENTOLIN HFA) 108 (90 BASE) MCG/ACT inhaler Inhale 2 puffs into the lungs every 6 (six) hours as needed for wheezing.    cyclobenzaprine (FLEXERIL) 10 MG tablet Take 10 mg by mouth 3 (three) times daily as needed for muscle spasms.  Refills: 2    diltiazem (CARDIZEM CD) 180 MG 24 hr capsule Take 180 mg by mouth daily. Refills: 11    lisinopril (PRINIVIL,ZESTRIL) 40 MG tablet Take 80 mg by mouth daily.    montelukast (SINGULAIR) 10 MG tablet Take 10 mg by mouth daily.    Multiple Vitamin (MULTIVITAMIN WITH MINERALS) TABS Take 1 tablet by mouth daily.    nortriptyline (PAMELOR) 25 MG capsule Take 25 mg by mouth at bedtime.     sertraline (ZOLOFT) 100 MG tablet Take 1 tablet (100 mg total) by mouth 2 (two) times daily. Qty: 60 tablet, Refills: 2    spironolactone (ALDACTONE) 50 MG tablet Take 50 mg by mouth daily.    sucralfate (CARAFATE) 1 g tablet Take 1 g by mouth 4 (four) times daily.     topiramate (TOPAMAX) 50 MG tablet Take 50 mg by mouth daily. Refills: 11       Allergies  Allergen Reactions  . Aspirin     Due to hx of stomach ulcers      The results of significant diagnostics from this hospitalization (including imaging, microbiology, ancillary and laboratory) are listed below for reference.    Significant Diagnostic Studies: Dg Chest Port 1 View  Result Date: 02/21/2017 CLINICAL DATA:   41 year old female status post surgical debridement of necrotizing soft tissue infection of the left gluteus. Sepsis. Remains intubated, attempted weaning yesterday. EXAM: PORTABLE CHEST 1 VIEW COMPARISON:  02/20/2017 and earlier. FINDINGS: Portable AP semi upright view at 0836 hours. Endotracheal tube tip projects between the clavicles and carina. Stable right IJ central line and visible enteric tube which courses to the abdomen. The patient is more rotated to the right. Patchy and confluent bibasilar opacity greater on the right, probably not significantly changed since yesterday allowing for rotation. No pneumothorax. No pulmonary edema. Possible small right pleural effusion. Stable cardiac size and mediastinal contours. IMPRESSION: 1.  Stable lines and tubes. 2. Stable patchy lung base opacity suspicious for pneumonia. Possible small right pleural effusion. Electronically Signed   By: Genevie Ann M.D.   On: 02/21/2017 08:52   Dg  Chest Port 1 View  Result Date: 02/20/2017 CLINICAL DATA:  Respiratory failure.  ETT placement. EXAM: PORTABLE CHEST 1 VIEW COMPARISON:  02/18/2017.  Global FINDINGS: Stable cardiomegaly. Stable cardiomediastinal silhouette. Unchanged tubes and lines. BILATERAL pulmonary opacities, RIGHT greater than LEFT without significant improvement. IMPRESSION: Stable chest. Electronically Signed   By: Staci Righter M.D.   On: 02/20/2017 07:07   Dg Chest Port 1 View  Result Date: 02/18/2017 CLINICAL DATA:  Respiratory failure  ET tube EXAM: PORTABLE CHEST 1 VIEW COMPARISON:  Chest x-rays dated 02/17/2017 and 02/16/2017. FINDINGS: Endotracheal tube is well positioned with tip just above the level of the carina. Enteric tube passes below the diaphragm. Right IJ central line is stable in position with tip at the level of the upper SVC. Stable cardiomegaly. Overall cardiomediastinal silhouette is stable. Patchy bilateral airspace opacities are stable compared to yesterday's exam, slightly increased  compared to the earlier exam of 02/16/2017. No pneumothorax seen. IMPRESSION: 1. Patchy bilateral airspace opacities are stable compared to yesterday's exam, but increased compared to the earlier exam of 02/16/2017, consistent with multifocal pneumonia versus asymmetric pulmonary edema. 2. Support apparatus, as detailed above. Endotracheal tube may have been advanced slightly since yesterday's study, now well positioned just above the level of the carina. 3. Stable cardiomegaly. Electronically Signed   By: Franki Cabot M.D.   On: 02/18/2017 08:17   Dg Chest Port 1 View  Result Date: 02/17/2017 CLINICAL DATA:  Intubation. EXAM: PORTABLE CHEST 1 VIEW COMPARISON:  February 16, 2017 FINDINGS: The ETT has been pulled back somewhat in the interval and terminates 2.8 cm below the thoracic inlet in adequate position. No pneumothorax. A right central line terminates in the central SVC. Increasing bilateral pulmonary opacities, right greater than left suggesting multifocal infiltrate/pneumonia. Cardiomegaly persists. IMPRESSION: 1. The ET tube has been pulled back but still terminates nearly 3 cm below the thoracic inlet in adequate position. Other support apparatus is stable. 2. Increasing bilateral pulmonary opacities. The appearance is concerning for multifocal pneumonia. Recommend clinical correlation and attention on follow-up. Electronically Signed   By: Dorise Bullion III M.D   On: 02/17/2017 07:44   Dg Chest Port 1 View  Result Date: 02/16/2017 CLINICAL DATA:  Postoperative debridement of gluteal necrotic lesion. Hypoxia. EXAM: PORTABLE CHEST 1 VIEW COMPARISON:  February 15, 2017 FINDINGS: Endotracheal tube tip is 4.7 cm above the carina. Central catheter tip is in the superior vena cava. Nasogastric tube tip is at the gastroesophageal junction. The tube deviates toward the right in the lower chest, likely due to a hiatal hernia. There is no appreciable edema or consolidation. There is right midlung atelectatic  change. There is cardiomegaly with pulmonary venous hypertension. No evident adenopathy. No bone lesions. IMPRESSION: Tube and catheter positions as described without pneumothorax. There is pulmonary vascular congestion. There is right midlung atelectasis. No frank edema or consolidation. Electronically Signed   By: Lowella Grip III M.D.   On: 02/16/2017 11:08   Dg Chest Port 1 View  Result Date: 02/15/2017 CLINICAL DATA:  Possible sepsis. History of asthma, hypertension and pulmonary embolism. History tachycardia. EXAM: PORTABLE CHEST 1 VIEW COMPARISON:  None. FINDINGS: Study limited by the patient's positioning, AP technique and the patient's body habitus. Cardiac silhouette is mildly enlarged. There is opacity at the left lung base. Mild hazy right mid to lower lung zone opacity is felt be due to the significant overlying soft tissues. No convincing pulmonary edema. No obvious pneumothorax.  No mediastinal or hilar masses. IMPRESSION:  1. Significantly limited study. 2. Mild enlargement of the cardiopericardial silhouette. 3. Possible pneumonia versus atelectasis at the left lung base. Electronically Signed   By: Lajean Manes M.D.   On: 02/15/2017 21:03   Dg Abd Portable 1v  Result Date: 02/16/2017 CLINICAL DATA:  NG tube placement EXAM: PORTABLE ABDOMEN - 1 VIEW COMPARISON:  None. FINDINGS: Nasogastric tube with the tip projecting over the body of the stomach. There is no bowel dilatation to suggest obstruction. There is no evidence of pneumoperitoneum, portal venous gas or pneumatosis. There are no pathologic calcifications along the expected course of the ureters. The osseous structures are unremarkable. IMPRESSION: Nasogastric tube with the tip projecting over the body of the stomach. Electronically Signed   By: Kathreen Devoid   On: 02/16/2017 11:54    Microbiology: No results found for this or any previous visit (from the past 240 hour(s)).   Labs: Basic Metabolic Panel:  Recent Labs Lab  02/21/17 0342 02/21/17 1400 02/22/17 0315 02/23/17 0507 02/24/17 0457 02/26/17 0452  NA 146* 151* 151* 142 140 138  K 2.6* 2.8* 2.9* 2.5* 3.0* 3.6  CL 110 110 111 100* 98* 97*  CO2 28 31 32 32 31 30  GLUCOSE 118* 118* 98 103* 120* 117*  BUN 34* 32* 25* 19 14 12   CREATININE 1.27* 1.25* 1.21* 0.96 0.97 1.09*  CALCIUM 8.3* 8.5* 8.2* 8.0* 8.2* 9.2  MG 1.8  --   --   --  1.5* 1.9  PHOS 3.3  --   --   --   --   --    Liver Function Tests:  Recent Labs Lab 02/24/17 0457 02/26/17 0452  AST 20 21  ALT 20 17  ALKPHOS 63 59  BILITOT 0.5 0.4  PROT 6.5 7.0  ALBUMIN 1.9* 2.2*   No results for input(s): LIPASE, AMYLASE in the last 168 hours. No results for input(s): AMMONIA in the last 168 hours. CBC:  Recent Labs Lab 02/21/17 0342 02/22/17 0315 02/23/17 0507 02/24/17 0457 02/26/17 0452  WBC 24.1* 23.3* 21.9* 18.1* 15.6*  HGB 7.9* 7.6* 8.2* 8.3* 8.6*  HCT 25.2* 25.2* 26.1* 27.1* 27.4*  MCV 86.0 88.1 88.5 88.3 89.0  PLT 351 355 343 345 365   Cardiac Enzymes: No results for input(s): CKTOTAL, CKMB, CKMBINDEX, TROPONINI in the last 168 hours. BNP: BNP (last 3 results) No results for input(s): BNP in the last 8760 hours.  ProBNP (last 3 results) No results for input(s): PROBNP in the last 8760 hours.  CBG:  Recent Labs Lab 02/25/17 0418 02/25/17 0818 02/26/17 0750 02/26/17 2123 02/27/17 0811  GLUCAP 124* 133* 137* 103* 116*       Signed:  Nita Sells MD   [facility] 02/27/2017, 4:01 PM

## 2017-02-27 NOTE — Clinical Social Work Note (Signed)
CSW was advised by nurse case manage Carles Collet that patient is agreeable to ST rehab at a skilled facility. CSW will f/u with patient regarding placement and rehab facilities.  Jacquelyn Antony Givens, MSW, LCSW Licensed Clinical Social Worker [place] [phone]

## 2017-02-27 NOTE — NC FL2 (Signed)
[place] LEVEL OF CARE SCREENING TOOL     IDENTIFICATION  Patient Name: Megan Ortiz Birthdate: Aug 14, 1976 Sex: female Admission Date (Current Location): 02/15/2017  [facility] and [place] Number:  Herbalist and Address:  [facility], [place] [address] [place], [place] [address]      Provider Number: [id number]  Attending Physician Name and Address:  Nita Sells, MD  Relative Name and Phone Number:  [place], [place] [phone] (home), [phone] (mobile); [place],Linda-Mother, [phone] (home)      Current Level of Care: Hospital Recommended Level of Care: [place] Prior Approval Number:    Date Approved/Denied:   PASRR Number:  (02/27/17-Submitted for Juliane Poot HC#[id number])  Discharge Plan: SNF    Current Diagnoses: Patient Active Problem List   Diagnosis Date Noted  . Benign essential HTN   . Uncomplicated asthma   . Super-super obese ([place])   . Adjustment disorder with mixed anxiety and depressed mood   . Hyperglycemia   . Leukocytosis   . Acute blood loss anemia   . Acute hypoxemic respiratory failure ([place])   . AKI (acute kidney injury) ([place])   . Septic shock ([place]) 02/16/2017  . Necrotizing soft tissue infection 02/16/2017  . Generalized anxiety disorder 12/22/2014    Orientation RESPIRATION BLADDER Height & Weight     Self, Time, Situation, Place  Normal Incontinent (Foley catheter inserted) Weight:  (weight on bed scale is way off) Height:  '5\' 4"'  (162.6 cm)  BEHAVIORAL SYMPTOMS/MOOD NEUROLOGICAL BOWEL NUTRITION STATUS      Incontinent (Rectal Tube) Diet (Regular)  AMBULATORY STATUS COMMUNICATION OF NEEDS Skin   Total Care (Patient was unable to ambulate with PT) Verbally Other (Comment) (Necrotiizing infection posterior thigh, Buttock abscess/Necrotizing fasciitis with strep viridans)                       Personal Care Assistance Level of Assistance  Bathing,  Feeding, Dressing Bathing Assistance: Maximum assistance Feeding assistance: Independent Dressing Assistance: Maximum assistance     Functional Limitations Info  Sight, Hearing, Speech Sight Info: Adequate Hearing Info: Adequate Speech Info: Adequate    SPECIAL CARE FACTORS FREQUENCY  PT (By licensed PT), Speech therapy     PT Frequency: Evaluation 4/13 and a minimum of 3X per week therapy recommended       Speech Therapy Frequency: Swallow eval on 4/12      Contractures Contractures Info: Not present    Additional Factors Info  Code Status, Allergies Code Status Info: Full Allergies Info: Aspirin           Current Medications (02/27/2017):  This is the current hospital active medication list Current Facility-Administered Medications  Medication Dose Route Frequency Provider Last Rate Last Dose  . 0.9 %  sodium chloride infusion   Intra-arterial PRN Rush Landmark, MD 10 mL/hr at 02/19/17 1900    . acetaminophen (TYLENOL) solution 1,000 mg  1,000 mg Oral Q8H Darci Current Simaan, PA-C   1,000 mg at 02/27/17 1412  . alum & mag hydroxide-simeth (MAALOX/MYLANTA) 200-200-20 MG/5ML suspension 15 mL  15 mL Oral Q4H PRN Nita Sells, MD      . amoxicillin-clavulanate (AUGMENTIN) 875-125 MG per tablet 1 tablet  1 tablet Oral Q12H Nita Sells, MD   1 tablet at 02/27/17 1034  . budesonide (PULMICORT) nebulizer solution 0.5 mg  0.5 mg Nebulization BID Jose Shirl Harris, MD   0.5 mg at 02/27/17 0800  .  chlorhexidine gluconate (MEDLINE KIT) (PERIDEX) 0.12 % solution 15 mL  15 mL Mouth Rinse BID Kara Mead V, MD   15 mL at 02/26/17 2244  . Chlorhexidine Gluconate Cloth 2 % PADS 6 each  6 each Topical Q0600 Raylene Miyamoto, MD   6 each at 02/22/17 1000  . enoxaparin (LOVENOX) injection 100 mg  100 mg Subcutaneous Q24H Kara Mead V, MD   100 mg at 02/27/17 1121  . feeding supplement (ENSURE ENLIVE) (ENSURE ENLIVE) liquid 237 mL  237 mL Oral TID BM Nita Sells, MD   237 mL at 02/27/17 1412  . fentaNYL (SUBLIMAZE) injection 25 mcg  25 mcg Intravenous Q1H PRN Raylene Miyamoto, MD   25 mcg at 02/26/17 0949  . furosemide (LASIX) injection 40 mg  40 mg Intravenous BID Raylene Miyamoto, MD   40 mg at 02/27/17 1121  . ipratropium-albuterol (DUONEB) 0.5-2.5 (3) MG/3ML nebulizer solution 3 mL  3 mL Nebulization Q4H PRN Nita Sells, MD      . MEDLINE mouth rinse  15 mL Mouth Rinse QID Kara Mead V, MD   15 mL at 02/27/17 1411  . ondansetron (ZOFRAN) injection 4 mg  4 mg Intravenous Q6H PRN Nita Sells, MD   4 mg at 02/26/17 0556  . oxyCODONE (Oxy IR/ROXICODONE) immediate release tablet 5-10 mg  5-10 mg Oral Q4H PRN Jill Alexanders, PA-C   10 mg at 02/27/17 1035  . pantoprazole (PROTONIX) EC tablet 40 mg  40 mg Oral QHS Nita Sells, MD   40 mg at 02/26/17 2209  . phenol (CHLORASEPTIC) mouth spray 1 spray  1 spray Mouth/Throat PRN Earnstine Regal, PA-C   1 spray at 02/23/17 2216  . polyethylene glycol (MIRALAX / GLYCOLAX) packet 17 g  17 g Per Tube Daily PRN Darci Current Simaan, PA-C      . potassium chloride SA (K-DUR,KLOR-CON) CR tablet 40 mEq  40 mEq Oral TID Nita Sells, MD   40 mEq at 02/27/17 1034  . senna-docusate (Senokot-S) tablet 1 tablet  1 tablet Per Tube Daily Jill Alexanders, PA-C   1 tablet at 02/27/17 1035  . sertraline (ZOLOFT) tablet 100 mg  100 mg Oral BID Rhetta Mura Schorr, NP   100 mg at 02/27/17 1035  . sodium chloride (OCEAN) 0.65 % nasal spray 1 spray  1 spray Each Nare PRN Earnstine Regal, PA-C      . sodium chloride flush (NS) 0.9 % injection 10-40 mL  10-40 mL Intracatheter PRN Rigoberto Noel, MD   10 mL at 02/25/17 2139     Discharge Medications: Please see discharge summary for a list of discharge medications.  Relevant Imaging Results:  Relevant Lab Results:   Additional Information [phone].  Patient weighs 510 pounds.  Sable Feil, LCSW

## 2017-02-27 NOTE — Consult Note (Signed)
Physical Medicine and Rehabilitation Consult  Reason for Consult: Debility due to sacral wound and super obesity.   Referring Physician: Dr. Verlon Au.    HPI: Megan Ortiz is a 41 y.o. female with history of HTN, asthma, morbid obesity, anxiety depression, who was admitted via [facility] on 02/17/47 with sepsis due to necrotizing wound on left buttock. She was started on IV antibiotics and required Neo-synephrine as well intubation due to septic shock.  She was taken to OR for  I & D of left gluteal and pararectal space by Dr. Dalbert Batman. Taken back to OR for wound evaluation on 4/9 and tolerated extubation 4/11. Wound is clean but high risk for complication and rectal tube in place and question of diverting colostomy. Dr. Thimmpa/plastics was consulted for input on skin graft v/s VAC. Local care recommended at this time and to revisit input on graft in 3-4 days. Therapy ongoing and working on bed mobility. CIR recommended to decrease burden of care.   She was independent without AD prior to admission. Husband helps with home management and will be able to assist with wound care. No other local family.    Review of Systems  HENT: Negative for hearing loss.   Eyes: Negative for blurred vision and double vision.  Respiratory: Negative for cough, shortness of breath and wheezing.   Cardiovascular: Negative for chest pain.  Gastrointestinal: Positive for diarrhea and heartburn.  Musculoskeletal: Negative for myalgias and neck pain.  Skin: Negative for itching and rash.  Neurological: Positive for weakness and headaches. Negative for sensory change, speech change and focal weakness.  Psychiatric/Behavioral: Positive for depression. The patient is nervous/anxious.   All other systems reviewed and are negative.     Past Medical History:  Diagnosis Date  . Anemia   . Anxiety   . Asthma   . Gastric ulcer   . Headache   . Hiatal hernia   . Hypertension   . PE (pulmonary embolism)   .  Tachycardia     Past Surgical History:  Procedure Laterality Date  . CESAREAN SECTION    . CHOLECYSTECTOMY    . INCISION AND DRAINAGE ABSCESS Left 02/16/2017   Procedure: DEBRIDEMENT LEFT BUTTOCK ABSCESS;  Surgeon: Fanny Skates, MD;  Location: [place];  Service: General;  Laterality: Left;  . IRRIGATION AND DEBRIDEMENT BUTTOCKS Left 02/19/2017   Procedure: DEBRIDEMENT OF GLUTEAL WOUND;  Surgeon: Rolm Bookbinder, MD;  Location[facility];  Service: General;  Laterality: Left;    Family History  Problem Relation Age of Onset  . Depression Mother   . Alcohol abuse Mother     Social History:  Married. Disabled. Husband is dialysis patient. She  reports that she has quit smoking. She has never used smokeless tobacco. She reports that she drinks alcohol 1-2 times a week. She reports that she does not use drugs.    Allergies  Allergen Reactions  . Aspirin     Due to hx of stomach ulcers    Medications Prior to Admission  Medication Sig Dispense Refill  . acetaminophen (TYLENOL) 500 MG tablet Take 500 mg by mouth every 6 (six) hours as needed for moderate pain.    Marland Kitchen albuterol (VENTOLIN HFA) 108 (90 BASE) MCG/ACT inhaler Inhale 2 puffs into the lungs every 6 (six) hours as needed for wheezing.    . cyclobenzaprine (FLEXERIL) 10 MG tablet Take 10 mg by mouth 3 (three) times daily as needed for muscle spasms.   2  . diltiazem (CARDIZEM  CD) 180 MG 24 hr capsule Take 180 mg by mouth daily.  11  . lisinopril (PRINIVIL,ZESTRIL) 40 MG tablet Take 80 mg by mouth daily.    . montelukast (SINGULAIR) 10 MG tablet Take 10 mg by mouth daily.    . Multiple Vitamin (MULTIVITAMIN WITH MINERALS) TABS Take 1 tablet by mouth daily.    . nortriptyline (PAMELOR) 25 MG capsule Take 25 mg by mouth at bedtime.     . sertraline (ZOLOFT) 100 MG tablet Take 1 tablet (100 mg total) by mouth 2 (two) times daily. 60 tablet 2  . spironolactone (ALDACTONE) 50 MG tablet Take 50 mg by mouth daily.    . sucralfate (CARAFATE)  1 g tablet Take 1 g by mouth 4 (four) times daily.     Marland Kitchen topiramate (TOPAMAX) 50 MG tablet Take 50 mg by mouth daily.  11    Home: Home Living Family/patient expects to be discharged to:: Private residence Living Arrangements: Spouse/significant other Available Help at Discharge: Family, Available 24 hours/day Type of Home: House Home Access: Stairs to enter Home Layout: One level Bathroom Shower/Tub: Public librarian, Architectural technologist: Standard Home Equipment: Cane - single point  Functional History: Prior Function Level of Independence: Independent Comments: Did her ADL's, cooked, light cleaning, no AD, Stayed home most of time Functional Status:  Mobility: Bed Mobility Overal bed mobility: Needs Assistance Bed Mobility: Rolling Rolling: Mod assist, +2 for physical assistance Sidelying to sit: Max assist, +2 for physical assistance Sit to supine: Max assist, Total assist, +2 for physical assistance General bed mobility comments: pt needed repositioning and boosting up toward [facility] while bed was non functional.  Rolled to add a draw sheet to assist with further rolling and positioning in the bed; Esti is able to briefly pull shoulders and upper trunk up to almost long sitting for very brief periods of time; also able to attain a L semi-sidelying elbow prop position of relative comfort in the bed Transfers General transfer comment: pt unable to make it all the way to EOB due to buttock pain.      ADL:    Cognition: Cognition Overall Cognitive Status: Within Functional Limits for tasks assessed Orientation Level: Oriented X4 Cognition Arousal/Alertness: Awake/alert Behavior During Therapy: Anxious Overall Cognitive Status: Within Functional Limits for tasks assessed   Blood pressure 132/66, pulse (!) 104, temperature 98 F (36.7 C), temperature source Oral, resp. rate 17, height 5\' 4"  (1.626 m), weight (!) 231.3 kg (510 lb), last menstrual period 02/01/2017, SpO2  92 %. Physical Exam  Nursing note and vitals reviewed. Constitutional: She is oriented to person, place, and time. She appears well-developed.  Obese  HENT:  Head: Normocephalic and atraumatic.  Mouth/Throat: Oropharynx is clear and moist.  edentulous   Eyes: Conjunctivae and EOM are normal. Pupils are equal, round, and reactive to light.  Neck: Normal range of motion. Neck supple.  Right neck with central line  Cardiovascular: Normal rate and regular rhythm.   Respiratory: Effort normal and breath sounds normal. No stridor. No respiratory distress. She has no wheezes.  GI: Soft. Bowel sounds are normal. She exhibits no distension. There is no tenderness.  Large pannus.  Genitourinary:  Genitourinary Comments: Foley and rectal tube in place.   Musculoskeletal: She exhibits no edema or tenderness.  Neurological: She is alert and oriented to person, place, and time.  Speech clear.  Follows commands without difficulty.  Sensation intact to light touch Motor: B/l UE 5/5 proximal to distal B/l LE: HF  3/5, KE 4/5, ADF/PF 5/5  Skin: Skin is warm and dry. No rash noted. No erythema.  Sacral wound not examined  Psychiatric: She has a normal mood and affect. Her behavior is normal.    Results for orders placed or performed during the hospital encounter of 02/15/17 (from the past 24 hour(s))  Glucose, capillary     Status: Abnormal   Collection Time: 02/26/17  9:23 PM  Result Value Ref Range   Glucose-Capillary 103 (H) 65 - 99 mg/dL  Glucose, capillary     Status: Abnormal   Collection Time: 02/27/17  8:11 AM  Result Value Ref Range   Glucose-Capillary 116 (H) 65 - 99 mg/dL   No results found.  Assessment/Plan: Diagnosis: Debility  Labs independently reviewed.  Records reviewed and summated above.  1. Does the need for close, 24 hr/day medical supervision in concert with the patient's rehab needs make it unreasonable for this patient to be served in a less intensive setting?  Potentially  2. Co-Morbidities requiring supervision/potential complications: HTN (monitor and provide prns in accordance with increased physical exertion and pain), asthma (monitor RR and O2 sats with increased mobility), morbid obesity (Body mass index is 87.54 kg/m., diet and exercise education, encourage weight loss to increase endurance and promote overall health), anxiety depression (ensure anxiety and resulting apprehension do not limit functional progress; consider prn medications if warranted), hyperglycemia (Monitor in accordance with exercise and adjust meds as necessary), leukocytosis (cont to monitor for signs and symptoms of infection, further workup if indicated), ABLA (transfuse if necessary to ensure appropriate perfusion for increased activity tolerance) 3. Due to bowel management, safety, skin/wound care, disease management, pain management and patient education, does the patient require 24 hr/day rehab nursing? Yes 4. Does the patient require coordinated care of a physician, rehab nurse, PT (1-2 hrs/day, 5 days/week) and OT (1-2 hrs/day, 5 days/week) to address physical and functional deficits in the context of the above medical diagnosis(es)? Yes Addressing deficits in the following areas: balance, endurance, locomotion, strength, transferring, bowel/bladder control, bathing, dressing and psychosocial support 5. Can the patient actively participate in an intensive therapy program of at least 3 hrs of therapy per day at least 5 days per week? Potentially 6. The potential for patient to make measurable gains while on inpatient rehab is excellent 7. Anticipated functional outcomes upon discharge from inpatient rehab are min assist  with PT, min assist with OT, n/a with SLP. 8. Estimated rehab length of stay to reach the above functional goals is: 10-14 days. 9. Does the patient have adequate social supports and living environment to accommodate these discharge functional goals?  Potentially 10. Anticipated D/C setting: Home 11. Anticipated post D/C treatments: HH therapy and Home excercise program 12. Overall Rehab/Functional Prognosis: good  RECOMMENDATIONS: This patient's condition is appropriate for continued rehabilitative care in the following setting: Will monitor for tolerance of therapies. Anticipate tolerance will improve as pain improves.  If pt continues to have deficits will consider CIR if careiver support avaialbe, once medically stable.  Patient has agreed to participate in recommended program. Yes Note that insurance prior authorization may be required for reimbursement for recommended care.  Comment: Rehab Admissions Coordinator to follow up.  Delice Lesch, MD, [address], [place] 02/27/2017

## 2017-02-27 NOTE — Care Management Note (Addendum)
Case Management Note  Patient Details  Name: Megan Ortiz MRN: [id number] Date of Birth: 1976/06/11  Subjective/Objective:                 [facility] transfer from [facility] for I&D of necrotizing wound to upper posterior thigh/ gluteal region on 02/16/17. Patient mobility currently limited by pain. CIR eval reveals patient unable to tolerate therapies necessary for admission due to pain. If pain lessons and ambulation/ movement becomes tolerable for PT patient could be candidate for CIR. Patient likely to be medically ready for SNF prior to that time.  Currently LTAC not an option due to lack of payer source. Patient requiring BID dressing changes to wound, also requires flexiseal to promote wound healing and skin health. Patient unable to do dressing changes herself to this region of her body. CM will further discuss SNF with patient. Patient in agreement for SNF, notified [place] CSW. She is working on placement. Anticipate DC tomorrow.   Action/Plan:   Expected Discharge Date:                  Expected Discharge Plan:  Skilled Nursing Facility  In-House Referral:  Clinical Social Work  Discharge planning Services  CM Consult  Post Acute Care Choice:    Choice offered to:     DME Arranged:    DME Agency:     HH Arranged:    [place] Agency:     Status of Service:  In process, will continue to follow  If discussed at Long Length of Stay Meetings, dates discussed:    Additional Comments:  Carles Collet, RN 02/27/2017, 3:36 PM

## 2017-02-28 LAB — BASIC METABOLIC PANEL
ANION GAP: 10 (ref 5–15)
BUN: 14 mg/dL (ref 6–20)
CALCIUM: 9 mg/dL (ref 8.9–10.3)
CHLORIDE: 100 mmol/L — AB (ref 101–111)
CO2: 27 mmol/L (ref 22–32)
CREATININE: 1.11 mg/dL — AB (ref 0.44–1.00)
GFR calc non Af Amer: >60 mL/min (ref >60)
Glucose, Bld: 110 mg/dL — ABNORMAL HIGH (ref 65–99)
Potassium: 4.2 mmol/L (ref 3.5–5.1)
Sodium: 137 mmol/L (ref 135–145)

## 2017-02-28 LAB — CBC WITH DIFFERENTIAL/PLATELET
Basophils Absolute: 0 10*3/uL (ref 0.0–0.1)
Basophils Relative: 0 %
EOS PCT: 2 %
Eosinophils Absolute: 0.3 10*3/uL (ref 0.0–0.7)
HEMATOCRIT: 26.5 % — AB (ref 36.0–46.0)
HEMOGLOBIN: 8.3 g/dL — AB (ref 12.0–15.0)
Lymphocytes Relative: 11 %
Lymphs Abs: 1.6 10*3/uL (ref 0.7–4.0)
MCH: 28.2 pg (ref 26.0–34.0)
MCHC: 31.3 g/dL (ref 30.0–36.0)
MCV: 90.1 fL (ref 78.0–100.0)
MONOS PCT: 4 %
Monocytes Absolute: 0.6 10*3/uL (ref 0.1–1.0)
NEUTROS PCT: 83 %
Neutro Abs: 12.2 10*3/uL — ABNORMAL HIGH (ref 1.7–7.7)
Platelets: 448 10*3/uL — ABNORMAL HIGH (ref 150–400)
RBC: 2.94 MIL/uL — AB (ref 3.87–5.11)
RDW: 18.9 % — ABNORMAL HIGH (ref 11.5–15.5)
WBC: 14.7 10*3/uL — AB (ref 4.0–10.5)

## 2017-02-28 LAB — MAGNESIUM: MAGNESIUM: 2.3 mg/dL (ref 1.7–2.4)

## 2017-02-28 NOTE — Progress Notes (Signed)
Inpatient Rehabilitation  Note Dr. Serita Grit consult for full details; however, patient will require long-term medical assist with wound management and 24/7 assist upon discharge, which spouse is unable to provide.  Note team's decision to pursue SNF for post acute rehab.  Will sign off at this time.  Please call if questions.  Carmelia Roller., CCC/SLP Admission Coordinator  [place]  Cell [phone]

## 2017-02-28 NOTE — Clinical Social Work Note (Signed)
CSW talked with Quita Skye, financial counselor with [facility] regarding Ms. Huebsch and learned that they completed paperwork for hospital financial assistance with patient and evaluated her for Medicaid, however they are not sure if patient will meet eligibility criteria for Medicaid.  Patient does not yet have a PASARR number, so cannot discharge until number rec'd, however efforts continue to find placement for Mrs. Hottel. CSW has been unable to secure a skilled facility for patient. Conferred with Surveyor, quantity for social work regarding patient and was advised to contact social work/case Haematologist - Dr. Reynaldo Minium. Call made to him and message left. Attending MD informed that patient not discharging to SNF today. CSW will continue efforts to locate appropriate placement for patient for wound care/rehab.  Kamyra Schroeck [place]ivens, MSW, LCSW Licensed Clinical Social Worker [place] [phone]

## 2017-02-28 NOTE — Progress Notes (Signed)
Bath given, wound care and dressing change done, pt tolerated well.

## 2017-02-28 NOTE — Progress Notes (Signed)
PROGRESS NOTE    Megan Ortiz  ZTI:[id number] DOB: 13-Aug-1976 DOA: 02/15/2017 PCP: No PCP Per Patient    Brief Narrative:  Admitted by critical care 02/16/2017 -necrotizing wound of left buttock, admit with MODS [hypotension, tachycardia, MAXIMUM TEMPERATURE 101, needing sepsis protocol, started on Levaquin 5] History of present illness 13/0.6--31/2.4 and admission Lactic acidosis 4.6, white count 42 hemoglobin 8.5 Transferred for Gen. surgery for debridement-wound grew strep viridans mixed anaerobic flora Eventually diuresed after initial volume resuscitation  Assessment & Plan:   Principal Problem:   Necrotizing soft tissue infection Active Problems:   Septic shock ([place])   Acute hypoxemic respiratory failure (HCC)   AKI (acute kidney injury) ([place])   Benign essential HTN   Uncomplicated asthma   Super-super obese (HCC)   Adjustment disorder with mixed anxiety and depressed mood   Hyperglycemia   Leukocytosis   Acute blood loss anemia  #1 acute respiratory failure secondary to sepsis with underlying diagnosis of asthma and prior tobacco.  Patient extubated 411 but required 4 L during initial recovery and weaned off. Patient on nebulizers of Pulmicort and Atrovent with clinical improvement. Follow.  #2 necrotizing fasciitis with strep very dense/buttocks abscess Patient was followed by general surgery during the hospitalization as well as by plastic surgery. Patient was seen in consultation by general surgery and underwent debridement of skin, subcutaneous tissue, muscle left gluteal and pararectal space and cultures obtained. Patient subsequently underwent dressing change of left gluteal wound under anesthesia per Dr. Donne Hazel on 02/19/2017. Patient was on empiric IV antibiotics IV Zosyn and IV clindamycin and subsequently transitioned to IV Unasyn and then to oral Augmentin. Patient currently afebrile. Patient undergoing wound dressing changes. Patient's WBC has trended down  from 40 on admission down to 14.7. Patient was also seen by plastic surgery and will follow-up in the outpatient setting.  #3 acute kidney injury Secondary to sepsis physiology and in the setting of ACE inhibitor/Aldactone. Patient's acute kidney injury improved and had resolved.  #4 hypokalemia/hypomagnesemia Replete.  #5 CHF vs OHSS versus pulmonary hypertension Patient was placed on Lasix IV with good diuresis. Clinical improvement.  #6 anemia Status post transfusion. H&H stable.  #7 iatrogenic diarrhea Patient needed to keep stools loose. Flexi seal placed to decrease 1 contamination.  #8 habitus of OHSS    DVT prophylaxis: Lovenox Code Status: Full Family Communication: Updated patient. No family at bedside. Disposition Plan: Pending skilled nursing facility placement.   Consultants:   Gen. surgery: Dr. Rosana Hoes 02/15/2017  Trauma surgery Dr. Georganna Skeans 02/16/2017  Plastic surgery Dr. Iran Planas 02/26/2017  Procedures:   Abdominal x-ray 02/16/2017  Chest x-ray 02/21/2017, 02/20/2017, 02/18/2017, 02/17/2017  Debridement of skin, subcutaneous tissue, and muscle left gluteal and pararectal space--02/16/2017 Dr Dalbert Batman -dressing change left gluteal wound, exam under anesthesia Dr Donne Hazel 02/19/2017 Transfuse 1 unit packed red blood cells 02/19/2017  Antimicrobials:   Augmentin 02/25/2017  IV Unasyn 02/22/2017 >>>>02/25/2017  IV Clindamycin 02/16/2017>>>>02/20/2017  IV Zosyn 02/16/2017>>>>> 02/22/2017  IV vancomycin 02/16/2017 x 1   Subjective: Patient with no chest pain. No shortness of breath. No complaints.  Objective: Vitals:   02/27/17 2121 02/28/17 0534 02/28/17 0710 02/28/17 0906  BP:  (!) 119/55  (!) 104/47  Pulse:  67  90  Resp:  17  18  Temp:  98.2 F (36.8 C)  98.1 F (36.7 C)  TempSrc:  Oral  Oral  SpO2: 94% 99% 94% 91%  Height:        Intake/Output Summary (Last 24 hours) at 02/28/17  1230 Last data filed at 02/28/17 1054  Gross per 24 hour    Intake              390 ml  Output             3650 ml  Net            -3260 ml   Filed Weights    Examination:  General exam: Appears calm and comfortable  Respiratory system: Clear to auscultation. Respiratory effort normal. Cardiovascular system: S1 & S2 heard, RRR. No JVD, murmurs, rubs, gallops or clicks. No pedal edema. Gastrointestinal system: Abdomen is nondistended, soft and nontender. No organomegaly or masses felt. Normal bowel sounds heard. Central nervous system: Alert and oriented. No focal neurological deficits. Extremities: Symmetric 5 x 5 power. Skin: Flexion ceiling place. Wound dressing changed.  Psychiatry: Judgement and insight appear normal. Mood & affect appropriate.     Data Reviewed: I have personally reviewed following labs and imaging studies  CBC:  Recent Labs Lab 02/22/17 0315 02/23/17 0507 02/24/17 0457 02/26/17 0452 02/28/17 0908  WBC 23.3* 21.9* 18.1* 15.6* 14.7*  NEUTROABS  --   --   --   --  PENDING  HGB 7.6* 8.2* 8.3* 8.6* 8.3*  HCT 25.2* 26.1* 27.1* 27.4* 26.5*  MCV 88.1 88.5 88.3 89.0 90.1  PLT 355 343 345 365 395*   Basic Metabolic Panel:  Recent Labs Lab 02/22/17 0315 02/23/17 0507 02/24/17 0457 02/26/17 0452 02/28/17 0908  NA 151* 142 140 138 137  K 2.9* 2.5* 3.0* 3.6 4.2  CL 111 100* 98* 97* 100*  CO2 32 32 '31 30 27  ' GLUCOSE 98 103* 120* 117* 110*  BUN 25* '19 14 12 14  ' CREATININE 1.21* 0.96 0.97 1.09* 1.11*  CALCIUM 8.2* 8.0* 8.2* 9.2 9.0  MG  --   --  1.5* 1.9 2.3   GFR: Estimated Creatinine Clearance: 131.9 mL/min (A) (by C-G formula based on SCr of 1.11 mg/dL (H)). Liver Function Tests:  Recent Labs Lab 02/24/17 0457 02/26/17 0452  AST 20 21  ALT 20 17  ALKPHOS 63 59  BILITOT 0.5 0.4  PROT 6.5 7.0  ALBUMIN 1.9* 2.2*   No results for input(s): LIPASE, AMYLASE in the last 168 hours. No results for input(s): AMMONIA in the last 168 hours. Coagulation Profile: No results for input(s): INR, PROTIME in  the last 168 hours. Cardiac Enzymes: No results for input(s): CKTOTAL, CKMB, CKMBINDEX, TROPONINI in the last 168 hours. BNP (last 3 results) No results for input(s): PROBNP in the last 8760 hours. HbA1C:  Recent Labs  02/26/17 0452  HGBA1C 5.4   CBG:  Recent Labs Lab 02/25/17 0418 02/25/17 0818 02/26/17 0750 02/26/17 2123 02/27/17 0811  GLUCAP 124* 133* 137* 103* 116*   Lipid Profile: No results for input(s): CHOL, HDL, LDLCALC, TRIG, CHOLHDL, LDLDIRECT in the last 72 hours. Thyroid Function Tests: No results for input(s): TSH, T4TOTAL, FREET4, T3FREE, THYROIDAB in the last 72 hours. Anemia Panel: No results for input(s): VITAMINB12, FOLATE, FERRITIN, TIBC, IRON, RETICCTPCT in the last 72 hours. Sepsis Labs: No results for input(s): PROCALCITON, LATICACIDVEN in the last 168 hours.  No results found for this or any previous visit (from the past 240 hour(s)).       Radiology Studies: No results found.      Scheduled Meds: . acetaminophen (TYLENOL) oral liquid 160 mg/5 mL  1,000 mg Oral Q8H  . amoxicillin-clavulanate  1 tablet Oral Q12H  . budesonide (PULMICORT) nebulizer  solution  0.5 mg Nebulization BID  . chlorhexidine gluconate (MEDLINE KIT)  15 mL Mouth Rinse BID  . Chlorhexidine Gluconate Cloth  6 each Topical Q0600  . enoxaparin (LOVENOX) injection  100 mg Subcutaneous Q24H  . feeding supplement (ENSURE ENLIVE)  237 mL Oral TID BM  . furosemide  40 mg Intravenous BID  . mouth rinse  15 mL Mouth Rinse QID  . pantoprazole  40 mg Oral QHS  . potassium chloride  40 mEq Oral TID  . senna-docusate  1 tablet Per Tube Daily  . sertraline  100 mg Oral BID   Continuous Infusions: . sodium chloride 10 mL/hr at 02/19/17 1900     LOS: 12 days    Time spent: [place], MD [facility] Pager [phone]  If 7PM-7AM, please contact night-coverage [email or web address] Password [email or web address] 02/28/2017, 12:30 PM

## 2017-02-28 NOTE — Progress Notes (Signed)
V/O Dr. Grandville Silos, hold off on RIJ removal for now.

## 2017-02-28 NOTE — Clinical Social Work Note (Signed)
Clinical Social Work Assessment  Patient Details  Name: Megan Ortiz MRN: [id number] Date of Birth: 11-Feb-1976  Date of referral:  02/27/17               Reason for consult:  Facility Placement                Permission sought to share information with:  Family Supports Permission granted to share information::  Yes, Verbal Permission Granted  Name::     Megan Ortiz   Agency::     Relationship::  Husband  Contact Information:  [phone] (home), [phone] (mobile)   Housing/Transportation Living arrangements for the past 2 months:  Single Family Home Source of Information:  Patient Patient Interpreter Needed:  None Criminal Activity/Legal Involvement Pertinent to Current Situation/Hospitalization:  No - Comment as needed Significant Relationships:  Spouse Lives with:  Spouse Do you feel safe going back to the place where you live?  Yes (Patient feels safe at home post-discharge, but is agreeable to SNF placement for wound care and rehab ) Need for family participation in patient care:  Yes (Comment)  Care giving concerns:  Patient reported that she lives at home with her husband and was independent with mobility prior to hospital admission. Patient is aware that she is weaker and will need assistance with care for her wound at discharge.   Social Worker assessment / plan: CSW talked with patient at the bedside regarding discharge disposition and recommendation of ST rehab.  ST rehab in a skilled facility, not having insurance and her care needs discussed. Mrs. Megan Ortiz understands that [place] may have to [place]ounty, but is hopeful that a facility in [facility] will accept her. Patient reported that her husband receives dialysis 3 times per week since 2016 and is on disability. Mrs. Megan Ortiz also informed CSW that she has applied for SSI before and was denied.  CSW was informed by patient that a man came to her room to complete paperwork, she is just not sure if  it was for disability or Medicaid.  Employment status:  Unemployed Forensic scientist:  Other (Comment Required) (No insurance. Patient reported that someone came to see her and completed a lot of paperwork, but was unable to tell CSW what paperwork was. CSW will f/u with financial counselors to determine if Medicaid and/or Disability application was completed.) PT Recommendations:  [place] / Referral to community resources:  [place] (CSW advised that with no insurance, patient will have to discharge to facility willing to accept her. Patient voiced understanding.)  Patient/Family's Response to care:  No concerns expressed by patient regarding care during hospitalization.  Patient/Family's Understanding of and Emotional Response to Diagnosis, Current Treatment, and Prognosis:  Not discussed.  Emotional Assessment Appearance:  Appears stated age Attitude/Demeanor/Rapport:  Other (Appropriate) Affect (typically observed):  Appropriate, Pleasant Orientation:  Oriented to Self, Oriented to Place, Oriented to  Time, Oriented to Situation Alcohol / Substance use:  Tobacco Use, Alcohol Use, Illicit Drugs (Patient reported that she quit smoking, drinks alcohol and does not use illicit drugs) Psych involvement (Current and /or in the community):  No (Comment)  Discharge Needs  Concerns to be addressed:  Discharge Planning Concerns Readmission within the last 30 days:  No Current discharge risk:  None Barriers to Discharge:  Inadequate or no insurance   Megan Ortiz, [place], LCSW 02/28/2017, 1:27 PM

## 2017-02-28 NOTE — Progress Notes (Signed)
IV team stated RIJ greater than 41 days old.  Paged Dr. Grandville Silos, does he want to remove or replace.

## 2017-03-01 LAB — CBC WITH DIFFERENTIAL/PLATELET
Basophils Absolute: 0 10*3/uL (ref 0.0–0.1)
Basophils Relative: 0 %
Eosinophils Absolute: 0.2 10*3/uL (ref 0.0–0.7)
Eosinophils Relative: 1 %
HEMATOCRIT: 27.1 % — AB (ref 36.0–46.0)
HEMOGLOBIN: 8.1 g/dL — AB (ref 12.0–15.0)
LYMPHS ABS: 1.6 10*3/uL (ref 0.7–4.0)
Lymphocytes Relative: 10 %
MCH: 27.1 pg (ref 26.0–34.0)
MCHC: 29.9 g/dL — AB (ref 30.0–36.0)
MCV: 90.6 fL (ref 78.0–100.0)
Monocytes Absolute: 1 10*3/uL (ref 0.1–1.0)
Monocytes Relative: 6 %
NEUTROS ABS: 13.2 10*3/uL — AB (ref 1.7–7.7)
NEUTROS PCT: 83 %
Platelets: 482 10*3/uL — ABNORMAL HIGH (ref 150–400)
RBC: 2.99 MIL/uL — ABNORMAL LOW (ref 3.87–5.11)
RDW: 19.1 % — ABNORMAL HIGH (ref 11.5–15.5)
WBC: 16.1 10*3/uL — ABNORMAL HIGH (ref 4.0–10.5)

## 2017-03-01 LAB — BASIC METABOLIC PANEL
Anion gap: 10 (ref 5–15)
BUN: 14 mg/dL (ref 6–20)
CHLORIDE: 99 mmol/L — AB (ref 101–111)
CO2: 26 mmol/L (ref 22–32)
CREATININE: 1.15 mg/dL — AB (ref 0.44–1.00)
Calcium: 8.7 mg/dL — ABNORMAL LOW (ref 8.9–10.3)
GFR calc non Af Amer: 58 mL/min — ABNORMAL LOW (ref >60)
Glucose, Bld: 118 mg/dL — ABNORMAL HIGH (ref 65–99)
Potassium: 4.5 mmol/L (ref 3.5–5.1)
Sodium: 135 mmol/L (ref 135–145)

## 2017-03-01 MED ORDER — CHLORHEXIDINE GLUCONATE 0.12 % MT SOLN
OROMUCOSAL | Status: AC
  Filled 2017-03-01: qty 15

## 2017-03-01 NOTE — Progress Notes (Signed)
PROGRESS NOTE    Megan Ortiz  DIY:[id number] DOB: 1976/06/01 DOA: 02/15/2017 PCP: No PCP Per Patient    Brief Narrative:  Admitted by critical care 02/16/2017 -necrotizing wound of left buttock, admit with MODS [hypotension, tachycardia, MAXIMUM TEMPERATURE 101, needing sepsis protocol, started on Levaquin 5] History of present illness 13/0.6--31/2.4 and admission Lactic acidosis 4.6, white count 42 hemoglobin 8.5 Transferred for Gen. surgery for debridement-wound grew strep viridans mixed anaerobic flora Eventually diuresed after initial volume resuscitation  Assessment & Plan:   Principal Problem:   Necrotizing soft tissue infection Active Problems:   Septic shock ([place])   Acute hypoxemic respiratory failure (HCC)   AKI (acute kidney injury) ([place])   Benign essential HTN   Uncomplicated asthma   Super-super obese (HCC)   Adjustment disorder with mixed anxiety and depressed mood   Hyperglycemia   Leukocytosis   Acute blood loss anemia  #1 acute respiratory failure secondary to sepsis with underlying diagnosis of asthma and prior tobacco.  Patient extubated 411 but required 4 L during initial recovery and weaned off. Patient on nebulizers of Pulmicort and Atrovent with clinical improvement. Follow.  #2 necrotizing fasciitis with strep very dense/buttocks abscess Patient was followed by general surgery during the hospitalization as well as by plastic surgery. Patient was seen in consultation by general surgery and underwent debridement of skin, subcutaneous tissue, muscle left gluteal and pararectal space and cultures obtained. Patient subsequently underwent dressing change of left gluteal wound under anesthesia per Dr. Donne Hazel on 02/19/2017. Patient was on empiric IV antibiotics IV Zosyn and IV clindamycin and subsequently transitioned to IV Unasyn and then to oral Augmentin. Patient currently afebrile. Patient undergoing wound dressing changes. Patient's WBC has trended down  from 40 on admission down to 16.1. Patient was also seen by plastic surgery and will follow-up in the outpatient setting.  #3 acute kidney injury Secondary to sepsis physiology and in the setting of ACE inhibitor/Aldactone. Patient's acute kidney injury improved and has resolved.  #4 hypokalemia/hypomagnesemia Repleted.  #5 CHF vs OHSS versus pulmonary hypertension Patient was placed on Lasix IV with good diuresis. Clinical improvement.  #6 anemia Status post transfusion. H&H stable.  #7 iatrogenic diarrhea Patient needed to keep stools loose. Flexi seal placed to decrease wound contamination.  #8 habitus of OHSS    DVT prophylaxis: Lovenox Code Status: Full Family Communication: Updated patient. No family at bedside. Disposition Plan: Skilled nursing facility when bed available.    Consultants:   Gen. surgery: Dr. Rosana Hoes 02/15/2017  Trauma surgery Dr. Georganna Skeans 02/16/2017  Plastic surgery Dr. Iran Planas 02/26/2017  Procedures:   Abdominal x-ray 02/16/2017  Chest x-ray 02/21/2017, 02/20/2017, 02/18/2017, 02/17/2017  Debridement of skin, subcutaneous tissue, and muscle left gluteal and pararectal space--02/16/2017 Dr Dalbert Batman -dressing change left gluteal wound, exam under anesthesia Dr Donne Hazel 02/19/2017 Transfuse 1 unit packed red blood cells 02/19/2017  Antimicrobials:   Augmentin 02/25/2017  IV Unasyn 02/22/2017 >>>>02/25/2017  IV Clindamycin 02/16/2017>>>>02/20/2017  IV Zosyn 02/16/2017>>>>> 02/22/2017  IV vancomycin 02/16/2017 x 1   Subjective: Patient with no chest pain. No shortness of breath. No complaints.  Objective: Vitals:   03/01/17 0826 03/01/17 0831 03/01/17 0941 03/01/17 1041  BP:   (!) 127/53   Pulse:   100   Resp:   18   Temp:   99.1 F (37.3 C)   TempSrc:   Oral   SpO2: (!) 86% 95% (!) 85% 91%  Height:        Intake/Output Summary (Last 24 hours) at 03/01/17  [place] filed at 03/01/17 1000  Gross per 24 hour  Intake               630 ml  Output             1200 ml  Net             -570 ml   Filed Weights    Examination:  General exam: Appears calm and comfortable  Respiratory system: Clear to auscultation. Respiratory effort normal. Cardiovascular system: S1 & S2 heard, RRR. No JVD, murmurs, rubs, gallops or clicks. No pedal edema. Gastrointestinal system: Abdomen is nondistended, soft and nontender. No organomegaly or masses felt. Normal bowel sounds heard. Central nervous system: Alert and oriented. No focal neurological deficits. Extremities: Symmetric 5 x 5 power. Skin: Flexion ceiling place. Wound dressing changed.  Psychiatry: Judgement and insight appear normal. Mood & affect appropriate.     Data Reviewed: I have personally reviewed following labs and imaging studies  CBC:  Recent Labs Lab 02/23/17 0507 02/24/17 0457 02/26/17 0452 02/28/17 0908 03/01/17 0436  WBC 21.9* 18.1* 15.6* 14.7* 16.1*  NEUTROABS  --   --   --  12.2* 13.2*  HGB 8.2* 8.3* 8.6* 8.3* 8.1*  HCT 26.1* 27.1* 27.4* 26.5* 27.1*  MCV 88.5 88.3 89.0 90.1 90.6  PLT 343 345 365 448* 546*   Basic Metabolic Panel:  Recent Labs Lab 02/23/17 0507 02/24/17 0457 02/26/17 0452 02/28/17 0908 03/01/17 0436  NA 142 140 138 137 135  K 2.5* 3.0* 3.6 4.2 4.5  CL 100* 98* 97* 100* 99*  CO2 32 '31 30 27 26  ' GLUCOSE 103* 120* 117* 110* 118*  BUN '19 14 12 14 14  ' CREATININE 0.96 0.97 1.09* 1.11* 1.15*  CALCIUM 8.0* 8.2* 9.2 9.0 8.7*  MG  --  1.5* 1.9 2.3  --    GFR: Estimated Creatinine Clearance: 127.3 mL/min (A) (by C-G formula based on SCr of 1.15 mg/dL (H)). Liver Function Tests:  Recent Labs Lab 02/24/17 0457 02/26/17 0452  AST 20 21  ALT 20 17  ALKPHOS 63 59  BILITOT 0.5 0.4  PROT 6.5 7.0  ALBUMIN 1.9* 2.2*   No results for input(s): LIPASE, AMYLASE in the last 168 hours. No results for input(s): AMMONIA in the last 168 hours. Coagulation Profile: No results for input(s): INR, PROTIME in the last 168  hours. Cardiac Enzymes: No results for input(s): CKTOTAL, CKMB, CKMBINDEX, TROPONINI in the last 168 hours. BNP (last 3 results) No results for input(s): PROBNP in the last 8760 hours. HbA1C: No results for input(s): HGBA1C in the last 72 hours. CBG:  Recent Labs Lab 02/25/17 0418 02/25/17 0818 02/26/17 0750 02/26/17 2123 02/27/17 0811  GLUCAP 124* 133* 137* 103* 116*   Lipid Profile: No results for input(s): CHOL, HDL, LDLCALC, TRIG, CHOLHDL, LDLDIRECT in the last 72 hours. Thyroid Function Tests: No results for input(s): TSH, T4TOTAL, FREET4, T3FREE, THYROIDAB in the last 72 hours. Anemia Panel: No results for input(s): VITAMINB12, FOLATE, FERRITIN, TIBC, IRON, RETICCTPCT in the last 72 hours. Sepsis Labs: No results for input(s): PROCALCITON, LATICACIDVEN in the last 168 hours.  No results found for this or any previous visit (from the past 240 hour(s)).       Radiology Studies: No results found.      Scheduled Meds: . acetaminophen (TYLENOL) oral liquid 160 mg/5 mL  1,000 mg Oral Q8H  . amoxicillin-clavulanate  1 tablet Oral Q12H  . budesonide (PULMICORT) nebulizer solution  0.5  mg Nebulization BID  . chlorhexidine gluconate (MEDLINE KIT)  15 mL Mouth Rinse BID  . Chlorhexidine Gluconate Cloth  6 each Topical Q0600  . enoxaparin (LOVENOX) injection  100 mg Subcutaneous Q24H  . feeding supplement (ENSURE ENLIVE)  237 mL Oral TID BM  . furosemide  40 mg Intravenous BID  . mouth rinse  15 mL Mouth Rinse QID  . pantoprazole  40 mg Oral QHS  . potassium chloride  40 mEq Oral TID  . senna-docusate  1 tablet Per Tube Daily  . sertraline  100 mg Oral BID   Continuous Infusions: . sodium chloride 10 mL/hr at 02/19/17 1900     LOS: 13 days    Time spent: [place], MD [facility] Pager [phone]  If 7PM-7AM, please contact night-coverage [email or web address] Password [email or web address] 03/01/2017, 1:56 PM

## 2017-03-01 NOTE — Progress Notes (Signed)
PT Cancellation Note  Patient Details Name: Shamyia Grandpre MRN: [id number] DOB: [birth year]   Cancelled Treatment:    Reason Eval/Treat Not Completed: Other (comment). Spoke with RN about seeing pt this afternoon. Nursing reports that they just finished changing the patients dressing and requests that PT not see pt at this time. PT will coordinate with nursing next session in order to improve mobility.    Scheryl Marten PT, DPT  [phone]  03/01/2017, 1:29 PM

## 2017-03-01 NOTE — Care Management Note (Addendum)
Case Management Note  Patient Details  Name: Laquinta Hazell MRN: [id number] Date of Birth: 1976/03/04  Subjective/Objective:        CM following for progression and d/c planning.             Action/Plan: 03/01/2017 Attempting to find cost of dressing materials currently being used for this pt as we attempt to find placement for this pt.  Await surgery decision re VAC use. Await pt RN with info re number of Interdry sheets needed per dressing change. Cost per sheet is $35.45.   Expected Discharge Date:               Expected Discharge Plan:  Skilled Nursing Facility  In-House Referral:  Clinical Social Work  Discharge planning Services  CM Consult  Post Acute Care Choice:   NA Choice offered to:   NA  DME Arranged:   NA DME Agency:   NA  HH Arranged:    [place] Agency:     Status of Service:  In process, will continue to follow  If discussed at Long Length of Stay Meetings, dates discussed:    Additional Comments:  Adron Bene, RN 03/01/2017, 3:02 PM

## 2017-03-02 LAB — CREATININE, SERUM
CREATININE: 1.23 mg/dL — AB (ref 0.44–1.00)
GFR calc Af Amer: >60 mL/min (ref >60)
GFR calc non Af Amer: 54 mL/min — ABNORMAL LOW (ref >60)

## 2017-03-02 MED ORDER — FUROSEMIDE 40 MG PO TABS
40.0000 mg | ORAL_TABLET | Freq: Two times a day (BID) | ORAL | Status: DC
  Administered 2017-03-03 – 2017-03-09 (×13): 40 mg via ORAL
  Filled 2017-03-02 (×14): qty 1

## 2017-03-02 MED ORDER — CHLORHEXIDINE GLUCONATE 0.12 % MT SOLN
OROMUCOSAL | Status: AC
  Filled 2017-03-02: qty 15

## 2017-03-02 NOTE — Progress Notes (Addendum)
Physical Therapy Treatment Patient Details Name: Megan Ortiz MRN: [id number] DOB: 07/25/1976 Today's Date: 03/02/2017    History of Present Illness 41 year old female with PMH of anemia, anxiety/depression, asthma, gastric ulcer, HTN, PE, and morbid obesity. Presents to [facility] ED on 4/5 with hypotension, tachycardia, and temp of 101.5. States that she has had a few days of diarrhea and decreased oral intake. Upon arrival to ED it was revealed that patient had an enlarging necrotizing wound of the buttocks (Patient states she first noted an "blister" about two days ago).  Transfered from [facility] to [facility] for further management.  Post I and D of gluteal wound.  Intubated post op, Extubated 4/11.    PT Comments    Pt continues to make progress with therapy. Pt is able to perform short distance gait from bed to recliner and from recliner back to bed. Pt becomes very anxious about sitting edge of recliner due to significant fear of falling. Pt is used to mobilizing without an AD and if fearful of using any equipment on wheels. Pt is reassured that she is making good progress and to continue trying with mobility. O2 sats remain about 90% this session at 92-94% on RA.     Follow Up Recommendations  SNF;Supervision/Assistance - 24 hour     Equipment Recommendations  Rolling walker with 5" wheels (bariatric)    Recommendations for Other Services       Precautions / Restrictions Precautions Precautions: Fall Restrictions Weight Bearing Restrictions: No    Mobility  Bed Mobility Overal bed mobility: Needs Assistance Bed Mobility: Rolling;Supine to Sit;Sit to Sidelying Rolling: Min guard Sidelying to sit: Min assist     Sit to sidelying: Max assist General bed mobility comments: Good efford to get up at EOB, Max A to bring Le's back into bed.   Transfers Overall transfer level: Needs assistance Equipment used: Rolling walker (2 wheeled) (bariatric) Transfers: Sit to/from Stand Sit to  Stand: Min assist;+2 safety/equipment         General transfer comment: Min A to stand from EOB and from [place] chair. Pt becomes anxious with standing and mobility.   Ambulation/Gait Ambulation/Gait assistance: Min assist;+2 physical assistance Ambulation Distance (Feet): 10 Feet (5x2) Assistive device: Rolling walker (2 wheeled) Gait Pattern/deviations: Shuffle;Trunk flexed Gait velocity: decreased Gait velocity interpretation: Below normal speed for age/gender General Gait Details: Cues to stay within walker and to slow down when attempting to sit in recliner. Close guard for safety   Stairs            Wheelchair Mobility    Modified Rankin (Stroke Patients Only)       Balance Overall balance assessment: Needs assistance Sitting-balance support: Single extremity supported;Feet supported Sitting balance-Leahy Scale: Fair     Standing balance support: During functional activity;Bilateral upper extremity supported Standing balance-Leahy Scale: Poor Standing balance comment: Reliant on RW for UE support                            Cognition Arousal/Alertness: Awake/alert Behavior During Therapy: WFL for tasks assessed/performed;Anxious Overall Cognitive Status: Within Functional Limits for tasks assessed                                        Exercises      General Comments        Pertinent Vitals/Pain Pain  Assessment: No/denies pain    Home Living                      Prior Function            PT Goals (current goals can now be found in the care plan section) Acute Rehab PT Goals Patient Stated Goal: To go home and be normal again Progress towards PT goals: Progressing toward goals    Frequency    Min 3X/week      PT Plan Current plan remains appropriate    Co-evaluation             End of Session   Activity Tolerance: Patient tolerated treatment well;Other (comment) (limited due to  anxiety) Patient left: in bed;with call bell/phone within reach;with nursing/sitter in room Nurse Communication: Mobility status PT Visit Diagnosis: Other abnormalities of gait and mobility (R26.89);Muscle weakness (generalized) (M62.81);Difficulty in walking, not elsewhere classified (R26.2)     Time: 0258-5277 PT Time Calculation (min) (ACUTE ONLY): 42 min  Charges:  $Gait Training: 8-22 mins $Therapeutic Activity: 23-37 mins                    G Codes:       Scheryl Marten PT, DPT  [phone]    Jacqulyn Liner Sloan Leiter 03/02/2017, 12:31 PM

## 2017-03-02 NOTE — Progress Notes (Signed)
Dear Doctor: This patient continues to lose her IV's but, her central line IJ needs to be removed. If she will contiue to need intravenous treatment please consider ordering a PICC line. Thanks. [facility] VAS/IV Team

## 2017-03-02 NOTE — Progress Notes (Signed)
  Plastic Surgery 4.20.2018 [place]-inpatient  Temp:  [98.3 F (36.8 C)-99.3 F (37.4 C)] 98.9 F (37.2 C) (04/20 0921) Pulse Rate:  [95-106] 95 (04/20 0921) Resp:  [18-20] 19 (04/20 0921) BP: (122-129)/(53-87) 122/53 (04/20 0921) SpO2:  [90 %-98 %] 94 % (04/20 0921)   Patient alert and cooperative, no distress. Able to get OOB and to chair with PT. Continues with rectal tube, RN states dressing changes have to be done several times per day due to contamination.  PE Wound clean, 100% granulation and flush with skin margins No significant contraction wound Stool contamination wound   A/P left thigh wound, morbid obesity  In setting of multiple dressing changes per day and stool contamination, silver alginate not warranted. Will return to moist dressings daily and PRN.  Discussed with patient surgery. In present setting with multiple BM contamination, a skin graft will likely not be successful and not worth surgical risk to her given her obesity. Patient herself states she feels like a skin graft would be creating a similar problem to what she has. No plan for surgery or VAC at this time.  Patient notes she is a vegan at home and baseline has soft stool once daily. Unclear why she continues with watery stool here, senna d/c today. I discussed that this BM contamination and several BM per day is limiting what can be done with wound care or surgery. If this can resolve to a more manageable 1-2 times of semisolid stool then we can revisit surgery or other wound care options.   Patient preference is to return home. States she feels safer there and has had wounds in past (reports abdominal incision packing post cholecystectomy) and completed care. If patient is able to stand to shower post each BM, and if she and husband can do dressing over wound, she could return home from my perspective. I do not feel she can do that today, but she has made progress over last few days.  Will reexamine  early next week.   Irene Limbo, MD [facility] Plastic & Reconstructive Surgery [phone], pin [phone]

## 2017-03-02 NOTE — Clinical Social Work Note (Signed)
CSW continuing to work on placement for patient. 03/01/17 - continuing conversation with assistant social work Database administrator regarding placement and patient will now be considered difficult to place (DTP). Information transmitted to [place] and [employer] on the 19 th. Followed up with facilities: message left with Tammi at [facility], talked with Suanne Marker, admissions director at [place], admissions director at [facility], and both informed CSW that Edwyna Ready would need to talk with SNF administrator regarding placement.   4/20 - Talked again with Zack regarding results of contacts with [facility], [place] and [employer] and was advised to send information to [employer]. Efforts to locate placement will continue.  Rogelio Waynick Givens, MSW, LCSW Licensed Clinical Social Worker [place] [phone]

## 2017-03-02 NOTE — Progress Notes (Signed)
PROGRESS NOTE    Megan Ortiz  XAJ:[id number] DOB: 05-28-1976 DOA: 02/15/2017 PCP: No PCP Per Patient    Brief Narrative:  Admitted by critical care 02/16/2017 -necrotizing wound of left buttock, admit with MODS [hypotension, tachycardia, MAXIMUM TEMPERATURE 101, needing sepsis protocol, started on Levaquin 5] History of present illness 13/0.6--31/2.4 and admission Lactic acidosis 4.6, white count 42 hemoglobin 8.5 Transferred for Gen. surgery for debridement-wound grew strep viridans mixed anaerobic flora Eventually diuresed after initial volume resuscitation  Assessment & Plan:   Principal Problem:   Necrotizing soft tissue infection Active Problems:   Septic shock ([place])   Acute hypoxemic respiratory failure (HCC)   AKI (acute kidney injury) ([place])   Benign essential HTN   Uncomplicated asthma   Super-super obese (HCC)   Adjustment disorder with mixed anxiety and depressed mood   Hyperglycemia   Leukocytosis   Acute blood loss anemia  #1 acute respiratory failure secondary to sepsis with underlying diagnosis of asthma and prior tobacco.  Patient extubated 411 but required 4 L during initial recovery and weaned off. Patient on nebulizers of Pulmicort and Atrovent with clinical improvement. Follow.  #2 necrotizing fasciitis with strep very dense/buttocks abscess Patient was followed by general surgery during the hospitalization as well as by plastic surgery. Patient was seen in consultation by general surgery and underwent debridement of skin, subcutaneous tissue, muscle left gluteal and pararectal space and cultures obtained. Patient subsequently underwent dressing change of left gluteal wound under anesthesia per Dr. Donne Hazel on 02/19/2017. Patient was on empiric IV antibiotics IV Zosyn and IV clindamycin and subsequently transitioned to IV Unasyn and then to oral Augmentin. Patient currently afebrile. Patient undergoing wound dressing changes. Patient's WBC has trended down  from 40 on admission down to 16.1. Patient was also seen by plastic surgery and will follow-up in the outpatient setting.  #3 acute kidney injury Secondary to sepsis physiology and in the setting of ACE inhibitor/Aldactone. Patient's acute kidney injury improved and has resolved.  #4 hypokalemia/hypomagnesemia Repleted.  #5 CHF vs OHSS versus pulmonary hypertension Patient was placed on Lasix IV with good diuresis. Change IV Lasix to oral Lasix. Clinical improvement.  #6 anemia Status post transfusion. H&H stable.  #7 iatrogenic diarrhea Patient noted to have stools loose. Discontinue stool softener. Flexi seal placed to decrease wound contamination.  #8 habitus of OHSS    DVT prophylaxis: Lovenox Code Status: Full Family Communication: Updated patient. No family at bedside. Disposition Plan: Skilled nursing facility when bed available.    Consultants:   Gen. surgery: Dr. Rosana Hoes 02/15/2017  Trauma surgery Dr. Georganna Skeans 02/16/2017  Plastic surgery Dr. Iran Planas 02/26/2017  Procedures:   Abdominal x-ray 02/16/2017  Chest x-ray 02/21/2017, 02/20/2017, 02/18/2017, 02/17/2017  Debridement of skin, subcutaneous tissue, and muscle left gluteal and pararectal space--02/16/2017 Dr Dalbert Batman -dressing change left gluteal wound, exam under anesthesia Dr Donne Hazel 02/19/2017 Transfuse 1 unit packed red blood cells 02/19/2017  Antimicrobials:   Augmentin 02/25/2017  IV Unasyn 02/22/2017 >>>>02/25/2017  IV Clindamycin 02/16/2017>>>>02/20/2017  IV Zosyn 02/16/2017>>>>> 02/22/2017  IV vancomycin 02/16/2017 x 1   Subjective: Patient with no chest pain. No shortness of breath.   Objective: Vitals:   03/01/17 2005 03/01/17 2205 03/02/17 0624 03/02/17 0921  BP:  127/64 129/87 (!) 122/53  Pulse:  (!) 104 (!) 106 95  Resp:  _0 Temp:  98.7 F (37.1 C) 98.3 F (36.8 C) 98.9 F (37.2 C)  TempSrc:  Oral Oral Oral  SpO2: 94% 90% 98% 94%  Height:  Intake/Output Summary  (Last 24 hours) at 03/02/17 1401 Last data filed at 03/02/17 1011  Gross per 24 hour  Intake              240 ml  Output             2825 ml  Net            -2585 ml   Filed Weights    Examination:  General exam: Appears calm and comfortable  Respiratory system: Clear to auscultation. Respiratory effort normal. Cardiovascular system: S1 & S2 heard, RRR. No JVD, murmurs, rubs, gallops or clicks. No pedal edema. Gastrointestinal system: Abdomen is nondistended, soft and nontender. No organomegaly or masses felt. Normal bowel sounds heard. Central nervous system: Alert and oriented. No focal neurological deficits. Extremities: Symmetric 5 x 5 power. Skin: Flex seal in place. Wound dressing changed.  Psychiatry: Judgement and insight appear normal. Mood & affect appropriate.     Data Reviewed: I have personally reviewed following labs and imaging studies  CBC:  Recent Labs Lab 02/24/17 0457 02/26/17 0452 02/28/17 0908 03/01/17 0436  WBC 18.1* 15.6* 14.7* 16.1*  NEUTROABS  --   --  12.2* 13.2*  HGB 8.3* 8.6* 8.3* 8.1*  HCT 27.1* 27.4* 26.5* 27.1*  MCV 88.3 89.0 90.1 90.6  PLT 345 365 448* 240*   Basic Metabolic Panel:  Recent Labs Lab 02/24/17 0457 02/26/17 0452 02/28/17 0908 03/01/17 0436 03/02/17 0340  NA 140 138 137 135  --   K 3.0* 3.6 4.2 4.5  --   CL 98* 97* 100* 99*  --   CO2 _0 --   GLUCOSE 120* 117* 110* 118*  --   BUN _1 --   CREATININE 0.97 1.09* 1.11* 1.15* 1.23*  CALCIUM 8.2* 9.2 9.0 8.7*  --   MG 1.5* 1.9 2.3  --   --    GFR: Estimated Creatinine Clearance: 119.1 mL/min (A) (by C-G formula based on SCr of 1.23 mg/dL (H)). Liver Function Tests:  Recent Labs Lab 02/24/17 0457 02/26/17 0452  AST 20 21  ALT 20 17  ALKPHOS 63 59  BILITOT 0.5 0.4  PROT 6.5 7.0  ALBUMIN 1.9* 2.2*   No results for input(s): LIPASE, AMYLASE in the last 168 hours. No results for input(s): AMMONIA in the last 168 hours. Coagulation  Profile: No results for input(s): INR, PROTIME in the last 168 hours. Cardiac Enzymes: No results for input(s): CKTOTAL, CKMB, CKMBINDEX, TROPONINI in the last 168 hours. BNP (last 3 results) No results for input(s): PROBNP in the last 8760 hours. HbA1C: No results for input(s): HGBA1C in the last 72 hours. CBG:  Recent Labs Lab 02/25/17 0418 02/25/17 0818 02/26/17 0750 02/26/17 2123 02/27/17 0811  GLUCAP 124* 133* 137* 103* 116*   Lipid Profile: No results for input(s): CHOL, HDL, LDLCALC, TRIG, CHOLHDL, LDLDIRECT in the last 72 hours. Thyroid Function Tests: No results for input(s): TSH, T4TOTAL, FREET4, T3FREE, THYROIDAB in the last 72 hours. Anemia Panel: No results for input(s): VITAMINB12, FOLATE, FERRITIN, TIBC, IRON, RETICCTPCT in the last 72 hours. Sepsis Labs: No results for input(s): PROCALCITON, LATICACIDVEN in the last 168 hours.  No results found for this or any previous visit (from the past 240 hour(s)).       Radiology Studies: No results found.      Scheduled Meds: . acetaminophen (TYLENOL) oral liquid 160 mg/5 mL  1,000 mg Oral Q8H  . budesonide (PULMICORT) nebulizer  solution  0.5 mg Nebulization BID  . chlorhexidine gluconate (MEDLINE KIT)  15 mL Mouth Rinse BID  . Chlorhexidine Gluconate Cloth  6 each Topical Q0600  . enoxaparin (LOVENOX) injection  100 mg Subcutaneous Q24H  . feeding supplement (ENSURE ENLIVE)  237 mL Oral TID BM  . furosemide  40 mg Oral BID  . mouth rinse  15 mL Mouth Rinse QID  . pantoprazole  40 mg Oral QHS  . potassium chloride  40 mEq Oral TID  . senna-docusate  1 tablet Per Tube Daily  . sertraline  100 mg Oral BID   Continuous Infusions: . sodium chloride 10 mL/hr at 02/19/17 1900     LOS: 14 days    Time spent: [place], MD [facility] Pager [phone]  If 7PM-7AM, please contact night-coverage [email or web address] Password [email or web address] 03/02/2017, 2:01 PM

## 2017-03-03 MED ORDER — LOPERAMIDE HCL 2 MG PO CAPS
4.0000 mg | ORAL_CAPSULE | Freq: Once | ORAL | Status: AC
  Administered 2017-03-03: 4 mg via ORAL
  Filled 2017-03-03: qty 2

## 2017-03-03 MED ORDER — ONDANSETRON HCL 4 MG PO TABS
4.0000 mg | ORAL_TABLET | Freq: Three times a day (TID) | ORAL | Status: DC | PRN
  Administered 2017-03-03 – 2017-03-04 (×2): 4 mg via ORAL
  Administered 2017-03-06 – 2017-03-09 (×6): 8 mg via ORAL
  Filled 2017-03-03: qty 1
  Filled 2017-03-03: qty 2
  Filled 2017-03-03: qty 1
  Filled 2017-03-03 (×2): qty 2
  Filled 2017-03-03: qty 1
  Filled 2017-03-03 (×2): qty 2
  Filled 2017-03-03: qty 1
  Filled 2017-03-03: qty 2

## 2017-03-03 NOTE — Progress Notes (Signed)
PROGRESS NOTE    Megan Ortiz  FYB:[id number] DOB: Jan 25, 1976 DOA: 02/15/2017 PCP: No PCP Per Patient    Brief Narrative:  Admitted by critical care 02/16/2017 -necrotizing wound of left buttock, admit with MODS [hypotension, tachycardia, MAXIMUM TEMPERATURE 101, needing sepsis protocol, started on Levaquin 5] History of present illness 13/0.6--31/2.4 and admission Lactic acidosis 4.6, white count 42 hemoglobin 8.5 Transferred for Gen. surgery for debridement-wound grew strep viridans mixed anaerobic flora Eventually diuresed after initial volume resuscitation  Assessment & Plan:   Principal Problem:   Necrotizing soft tissue infection Active Problems:   Septic shock ([place])   Acute hypoxemic respiratory failure (HCC)   AKI (acute kidney injury) ([place])   Benign essential HTN   Uncomplicated asthma   Super-super obese (HCC)   Adjustment disorder with mixed anxiety and depressed mood   Hyperglycemia   Leukocytosis   Acute blood loss anemia  #1 acute respiratory failure secondary to sepsis with underlying diagnosis of asthma and prior tobacco.  Patient extubated 411 but required 4 L during initial recovery and weaned off. Patient on nebulizers of Pulmicort and Atrovent with clinical improvement. Follow.  #2 necrotizing fasciitis with strep very dense/buttocks abscess Patient was followed by general surgery during the hospitalization as well as by plastic surgery. Patient was seen in consultation by general surgery and underwent debridement of skin, subcutaneous tissue, muscle left gluteal and pararectal space and cultures obtained. Patient subsequently underwent dressing change of left gluteal wound under anesthesia per Dr. Donne Hazel on 02/19/2017. Patient was on empiric IV antibiotics IV Zosyn and IV clindamycin and subsequently transitioned to IV Unasyn and then to oral Augmentin. Patient currently afebrile. Patient undergoing wound dressing changes. Patient's WBC has trended down  from 40 on admission down to 16.1. Patient was also seen by plastic surgery and will follow-up in the outpatient setting.  #3 acute kidney injury Secondary to sepsis physiology and in the setting of ACE inhibitor/Aldactone. Patient's acute kidney injury improved and has resolved.  #4 hypokalemia/hypomagnesemia Repleted.  #5 CHF vs OHSS versus pulmonary hypertension Patient was placed on Lasix IV with good diuresis. Changed IV Lasix to oral Lasix. Clinical improvement.  #6 anemia Status post transfusion. H&H stable.  #7 iatrogenic diarrhea Patient noted to have stools loose. Discontinued stool softener. Flexi seal placed to decrease wound contamination. Imodium x 1.  #8 habitus of OHSS    DVT prophylaxis: Lovenox Code Status: Full Family Communication: Updated patient. No family at bedside. Disposition Plan: Skilled nursing facility when bed available.    Consultants:   Gen. surgery: Dr. Rosana Hoes 02/15/2017  Trauma surgery Dr. Georganna Skeans 02/16/2017  Plastic surgery Dr. Iran Planas 02/26/2017  Procedures:   Abdominal x-ray 02/16/2017  Chest x-ray 02/21/2017, 02/20/2017, 02/18/2017, 02/17/2017  Debridement of skin, subcutaneous tissue, and muscle left gluteal and pararectal space--02/16/2017 Dr Dalbert Batman -dressing change left gluteal wound, exam under anesthesia Dr Donne Hazel 02/19/2017 Transfuse 1 unit packed red blood cells 02/19/2017  Antimicrobials:   Augmentin 02/25/2017  IV Unasyn 02/22/2017 >>>>02/25/2017  IV Clindamycin 02/16/2017>>>>02/20/2017  IV Zosyn 02/16/2017>>>>> 02/22/2017  IV vancomycin 02/16/2017 x 1   Subjective: Patient with no chest pain. No shortness of breath.   Objective: Vitals:   03/02/17 2140 03/03/17 0626 03/03/17 0834 03/03/17 0957  BP: (!) 117/42 119/65  (!) 112/54  Pulse: 100 87  90  Resp: '20 16  16  ' Temp: 98.1 F (36.7 C) 97.4 F (36.3 C)  98.2 F (36.8 C)  TempSrc: Oral Oral  Oral  SpO2: 91% 94% 96% 94%  Height:        Intake/Output  Summary (Last 24 hours) at 03/03/17 1429 Last data filed at 03/03/17 0900  Gross per 24 hour  Intake              850 ml  Output              675 ml  Net              175 ml   Filed Weights    Examination:  General exam: Appears calm and comfortable  Respiratory system: Clear to auscultation. Respiratory effort normal. Cardiovascular system: S1 & S2 heard, RRR. No JVD, murmurs, rubs, gallops or clicks. No pedal edema. Gastrointestinal system: Abdomen is nondistended, soft and nontender. No organomegaly or masses felt. Normal bowel sounds heard. Central nervous system: Alert and oriented. No focal neurological deficits. Extremities: Symmetric 5 x 5 power. Skin: Flex seal in place. Wound dressing changed.  Psychiatry: Judgement and insight appear normal. Mood & affect appropriate.     Data Reviewed: I have personally reviewed following labs and imaging studies  CBC:  Recent Labs Lab 02/26/17 0452 02/28/17 0908 03/01/17 0436  WBC 15.6* 14.7* 16.1*  NEUTROABS  --  12.2* 13.2*  HGB 8.6* 8.3* 8.1*  HCT 27.4* 26.5* 27.1*  MCV 89.0 90.1 90.6  PLT 365 448* 212*   Basic Metabolic Panel:  Recent Labs Lab 02/26/17 0452 02/28/17 0908 03/01/17 0436 03/02/17 0340  NA 138 137 135  --   K 3.6 4.2 4.5  --   CL 97* 100* 99*  --   CO2 '30 27 26  ' --   GLUCOSE 117* 110* 118*  --   BUN '12 14 14  ' --   CREATININE 1.09* 1.11* 1.15* 1.23*  CALCIUM 9.2 9.0 8.7*  --   MG 1.9 2.3  --   --    GFR: Estimated Creatinine Clearance: 119.1 mL/min (A) (by C-G formula based on SCr of 1.23 mg/dL (H)). Liver Function Tests:  Recent Labs Lab 02/26/17 0452  AST 21  ALT 17  ALKPHOS 59  BILITOT 0.4  PROT 7.0  ALBUMIN 2.2*   No results for input(s): LIPASE, AMYLASE in the last 168 hours. No results for input(s): AMMONIA in the last 168 hours. Coagulation Profile: No results for input(s): INR, PROTIME in the last 168 hours. Cardiac Enzymes: No results for input(s): CKTOTAL, CKMB,  CKMBINDEX, TROPONINI in the last 168 hours. BNP (last 3 results) No results for input(s): PROBNP in the last 8760 hours. HbA1C: No results for input(s): HGBA1C in the last 72 hours. CBG:  Recent Labs Lab 02/25/17 0418 02/25/17 0818 02/26/17 0750 02/26/17 2123 02/27/17 0811  GLUCAP 124* 133* 137* 103* 116*   Lipid Profile: No results for input(s): CHOL, HDL, LDLCALC, TRIG, CHOLHDL, LDLDIRECT in the last 72 hours. Thyroid Function Tests: No results for input(s): TSH, T4TOTAL, FREET4, T3FREE, THYROIDAB in the last 72 hours. Anemia Panel: No results for input(s): VITAMINB12, FOLATE, FERRITIN, TIBC, IRON, RETICCTPCT in the last 72 hours. Sepsis Labs: No results for input(s): PROCALCITON, LATICACIDVEN in the last 168 hours.  No results found for this or any previous visit (from the past 240 hour(s)).       Radiology Studies: No results found.      Scheduled Meds: . acetaminophen (TYLENOL) oral liquid 160 mg/5 mL  1,000 mg Oral Q8H  . budesonide (PULMICORT) nebulizer solution  0.5 mg Nebulization BID  . chlorhexidine gluconate (MEDLINE KIT)  15 mL Mouth Rinse BID  .  Chlorhexidine Gluconate Cloth  6 each Topical Q0600  . enoxaparin (LOVENOX) injection  100 mg Subcutaneous Q24H  . feeding supplement (ENSURE ENLIVE)  237 mL Oral TID BM  . furosemide  40 mg Oral BID  . mouth rinse  15 mL Mouth Rinse QID  . pantoprazole  40 mg Oral QHS  . potassium chloride  40 mEq Oral TID  . sertraline  100 mg Oral BID   Continuous Infusions:    LOS: 15 days    Time spent: [place], MD [facility] Pager [phone]  If 7PM-7AM, please contact night-coverage [email or web address] Password [facility] 03/03/2017, 2:29 PM

## 2017-03-04 LAB — BASIC METABOLIC PANEL
ANION GAP: 9 (ref 5–15)
BUN: 10 mg/dL (ref 4–21)
BUN: 10 mg/dL (ref 6–20)
CALCIUM: 8.6 mg/dL — AB (ref 8.9–10.3)
CO2: 23 mmol/L (ref 22–32)
CREATININE: 0.9 mg/dL (ref 0.5–1.1)
Chloride: 100 mmol/L — ABNORMAL LOW (ref 101–111)
Creatinine, Ser: 0.89 mg/dL (ref 0.44–1.00)
GFR calc Af Amer: >60 mL/min (ref >60)
GFR calc non Af Amer: >60 mL/min (ref >60)
GLUCOSE: 117 mg/dL — AB (ref 65–99)
Glucose: 117 mg/dL
POTASSIUM: 3.9 mmol/L (ref 3.4–5.3)
POTASSIUM: 3.9 mmol/L (ref 3.5–5.1)
Sodium: 132 mmol/L — AB (ref 137–147)
Sodium: 132 mmol/L — ABNORMAL LOW (ref 135–145)

## 2017-03-04 LAB — CBC
HCT: 27.9 % — ABNORMAL LOW (ref 36.0–46.0)
Hemoglobin: 8.1 g/dL — ABNORMAL LOW (ref 12.0–15.0)
MCH: 26.3 pg (ref 26.0–34.0)
MCHC: 29 g/dL — ABNORMAL LOW (ref 30.0–36.0)
MCV: 90.6 fL (ref 78.0–100.0)
Platelets: 565 10*3/uL — ABNORMAL HIGH (ref 150–400)
RBC: 3.08 MIL/uL — ABNORMAL LOW (ref 3.87–5.11)
RDW: 18.6 % — AB (ref 11.5–15.5)
WBC: 10 10*3/uL (ref 4.0–10.5)

## 2017-03-04 LAB — MAGNESIUM: Magnesium: 2.3 mg/dL (ref 1.7–2.4)

## 2017-03-04 MED ORDER — ORAL CARE MOUTH RINSE
15.0000 mL | Freq: Two times a day (BID) | OROMUCOSAL | Status: DC
  Administered 2017-03-04 – 2017-03-07 (×5): 15 mL via OROMUCOSAL

## 2017-03-04 MED ORDER — ACETAMINOPHEN 500 MG PO TABS
1000.0000 mg | ORAL_TABLET | Freq: Three times a day (TID) | ORAL | Status: DC
  Administered 2017-03-05 – 2017-03-09 (×11): 1000 mg via ORAL
  Filled 2017-03-04 (×11): qty 2

## 2017-03-04 MED ORDER — CHOLESTYRAMINE LIGHT 4 G PO PACK
4.0000 g | PACK | Freq: Two times a day (BID) | ORAL | Status: DC
  Administered 2017-03-05 – 2017-03-07 (×5): 4 g via ORAL
  Filled 2017-03-04 (×7): qty 1

## 2017-03-04 NOTE — Progress Notes (Signed)
PROGRESS NOTE    Megan Ortiz  MEB:[id number] DOB: [birth year] DOA: 02/15/2017 PCP: No PCP Per Patient    Brief Narrative:  Admitted by critical care 02/16/2017 -necrotizing wound of left buttock, admit with MODS [hypotension, tachycardia, MAXIMUM TEMPERATURE 101, needing sepsis protocol, started on Levaquin 5] History of present illness 13/0.6--31/2.4 and admission Lactic acidosis 4.6, white count 42 hemoglobin 8.5 Transferred for Gen. surgery for debridement-wound grew strep viridans mixed anaerobic flora Eventually diuresed after initial volume resuscitation  Assessment & Plan:   Principal Problem:   Necrotizing soft tissue infection Active Problems:   Septic shock ([place])   Acute hypoxemic respiratory failure (HCC)   AKI (acute kidney injury) ([place])   Benign essential HTN   Uncomplicated asthma   Super-super obese (HCC)   Adjustment disorder with mixed anxiety and depressed mood   Hyperglycemia   Leukocytosis   Acute blood loss anemia  #1 acute respiratory failure secondary to sepsis with underlying diagnosis of asthma and prior tobacco.  Patient extubated 411 but required 4 L during initial recovery and weaned off. Patient on nebulizers of Pulmicort and Atrovent with clinical improvement. Follow.  #2 necrotizing fasciitis with strep very dense/buttocks abscess Patient was followed by general surgery during the hospitalization as well as by plastic surgery. Patient was seen in consultation by general surgery and underwent debridement of skin, subcutaneous tissue, muscle left gluteal and pararectal space and cultures obtained. Patient subsequently underwent dressing change of left gluteal wound under anesthesia per Dr. Donne Hazel on 02/19/2017. Patient was on empiric IV antibiotics IV Zosyn and IV clindamycin and subsequently transitioned to IV Unasyn and then to oral Augmentin. Patient currently afebrile. Patient undergoing wound dressing changes. Patient's WBC has trended down  from 40 on admission down to 10. Patient was also seen by plastic surgery and will follow-up in the outpatient setting.  #3 acute kidney injury Secondary to sepsis physiology and in the setting of ACE inhibitor/Aldactone. Patient's acute kidney injury improved and has resolved.  #4 hypokalemia/hypomagnesemia Repleted.  #5 CHF vs OHSS versus pulmonary hypertension Patient was placed on Lasix IV with good diuresis. Changed IV Lasix to oral Lasix. Clinical improvement.  #6 anemia Status post transfusion. H&H stable.  #7 iatrogenic diarrhea Patient noted to have stools loose. Discontinued stool softener. Flexi seal placed to decrease wound contamination. Imodium x 1.Place on questran.  #8 habitus of OHSS    DVT prophylaxis: Lovenox Code Status: Full Family Communication: Updated patient. No family at bedside. Disposition Plan: Skilled nursing facility when bed available.    Consultants:   Gen. surgery: Dr. Rosana Hoes 02/15/2017  Trauma surgery Dr. Georganna Skeans 02/16/2017  Plastic surgery Dr. Iran Planas 02/26/2017  Procedures:   Abdominal x-ray 02/16/2017  Chest x-ray 02/21/2017, 02/20/2017, 02/18/2017, 02/17/2017  Debridement of skin, subcutaneous tissue, and muscle left gluteal and pararectal space--02/16/2017 Dr Dalbert Batman -dressing change left gluteal wound, exam under anesthesia Dr Donne Hazel 02/19/2017 Transfuse 1 unit packed red blood cells 02/19/2017  Antimicrobials:   Augmentin 02/25/2017  IV Unasyn 02/22/2017 >>>>02/25/2017  IV Clindamycin 02/16/2017>>>>02/20/2017  IV Zosyn 02/16/2017>>>>> 02/22/2017  IV vancomycin 02/16/2017 x 1   Subjective: Patient with no chest pain. No shortness of breath. Patient asking whether she could go home instead of SNF.  Objective: Vitals:   03/03/17 2051 03/04/17 0444 03/04/17 0906 03/04/17 1049  BP:  130/69  (!) 122/42  Pulse: 95 92  97  Resp: '16 17  18  ' Temp:  98.5 F (36.9 C)  97.7 F (36.5 C)  TempSrc:  Oral  Oral  SpO2: 95% 96%  94% 92%  Height:        Intake/Output Summary (Last 24 hours) at 03/04/17 1428 Last data filed at 03/04/17 1002  Gross per 24 hour  Intake              240 ml  Output             2050 ml  Net            -1810 ml   Filed Weights    Examination:  General exam: Appears calm and comfortable  Respiratory system: Clear to auscultation. Respiratory effort normal. Cardiovascular system: S1 & S2 heard, RRR. No JVD, murmurs, rubs, gallops or clicks. No pedal edema. Gastrointestinal system: Abdomen is nondistended, soft and nontender. No organomegaly or masses felt. Normal bowel sounds heard. Central nervous system: Alert and oriented. No focal neurological deficits. Extremities: Symmetric 5 x 5 power. Skin: Flex seal in place. Wound dressing changed.  Psychiatry: Judgement and insight appear normal. Mood & affect appropriate.     Data Reviewed: I have personally reviewed following labs and imaging studies  CBC:  Recent Labs Lab 02/26/17 0452 02/28/17 0908 03/01/17 0436 03/04/17 0723  WBC 15.6* 14.7* 16.1* 10.0  NEUTROABS  --  12.2* 13.2*  --   HGB 8.6* 8.3* 8.1* 8.1*  HCT 27.4* 26.5* 27.1* 27.9*  MCV 89.0 90.1 90.6 90.6  PLT 365 448* 482* 128*   Basic Metabolic Panel:  Recent Labs Lab 02/26/17 0452 02/28/17 0908 03/01/17 0436 03/02/17 0340 03/04/17 0723  NA 138 137 135  --  132*  K 3.6 4.2 4.5  --  3.9  CL 97* 100* 99*  --  100*  CO2 '30 27 26  ' --  23  GLUCOSE 117* 110* 118*  --  117*  BUN '12 14 14  ' --  10  CREATININE 1.09* 1.11* 1.15* 1.23* 0.89  CALCIUM 9.2 9.0 8.7*  --  8.6*  MG 1.9 2.3  --   --  2.3   GFR: Estimated Creatinine Clearance: 164.5 mL/min (by C-G formula based on SCr of 0.89 mg/dL). Liver Function Tests:  Recent Labs Lab 02/26/17 0452  AST 21  ALT 17  ALKPHOS 59  BILITOT 0.4  PROT 7.0  ALBUMIN 2.2*   No results for input(s): LIPASE, AMYLASE in the last 168 hours. No results for input(s): AMMONIA in the last 168 hours. Coagulation  Profile: No results for input(s): INR, PROTIME in the last 168 hours. Cardiac Enzymes: No results for input(s): CKTOTAL, CKMB, CKMBINDEX, TROPONINI in the last 168 hours. BNP (last 3 results) No results for input(s): PROBNP in the last 8760 hours. HbA1C: No results for input(s): HGBA1C in the last 72 hours. CBG:  Recent Labs Lab 02/26/17 0750 02/26/17 2123 02/27/17 0811  GLUCAP 137* 103* 116*   Lipid Profile: No results for input(s): CHOL, HDL, LDLCALC, TRIG, CHOLHDL, LDLDIRECT in the last 72 hours. Thyroid Function Tests: No results for input(s): TSH, T4TOTAL, FREET4, T3FREE, THYROIDAB in the last 72 hours. Anemia Panel: No results for input(s): VITAMINB12, FOLATE, FERRITIN, TIBC, IRON, RETICCTPCT in the last 72 hours. Sepsis Labs: No results for input(s): PROCALCITON, LATICACIDVEN in the last 168 hours.  No results found for this or any previous visit (from the past 240 hour(s)).       Radiology Studies: No results found.      Scheduled Meds: . acetaminophen (TYLENOL) oral liquid 160 mg/5 mL  1,000 mg Oral Q8H  . budesonide (PULMICORT) nebulizer  solution  0.5 mg Nebulization BID  . chlorhexidine gluconate (MEDLINE KIT)  15 mL Mouth Rinse BID  . enoxaparin (LOVENOX) injection  100 mg Subcutaneous Q24H  . feeding supplement (ENSURE ENLIVE)  237 mL Oral TID BM  . furosemide  40 mg Oral BID  . pantoprazole  40 mg Oral QHS  . potassium chloride  40 mEq Oral TID  . sertraline  100 mg Oral BID   Continuous Infusions:    LOS: 16 days    Time spent: [place], MD [facility] Pager [phone]  If 7PM-7AM, please contact night-coverage [email or web address] Password [facility] 03/04/2017, 2:28 PM

## 2017-03-05 LAB — BASIC METABOLIC PANEL
Anion gap: 8 (ref 5–15)
BUN: 10 mg/dL (ref 6–20)
CHLORIDE: 101 mmol/L (ref 101–111)
CO2: 27 mmol/L (ref 22–32)
CREATININE: 0.87 mg/dL (ref 0.44–1.00)
Calcium: 8.9 mg/dL (ref 8.9–10.3)
GFR calc Af Amer: >60 mL/min (ref >60)
GFR calc non Af Amer: >60 mL/min (ref >60)
GLUCOSE: 105 mg/dL — AB (ref 65–99)
POTASSIUM: 3.6 mmol/L (ref 3.5–5.1)
Sodium: 136 mmol/L (ref 135–145)

## 2017-03-05 NOTE — Progress Notes (Signed)
Nutrition Follow-up  DOCUMENTATION CODES:   Morbid obesity  INTERVENTION:  Continue Ensure Enlive po TID, each supplement provides 350 kcal and 20 grams of protein.  Encourage adequate PO intake.  NUTRITION DIAGNOSIS:   Increased nutrient needs related to wound healing as evidenced by estimated needs; ongoing  GOAL:   Patient will meet greater than or equal to 90% of their needs; met  MONITOR:   PO intake, Supplement acceptance, Labs, Weight trends, Skin, I & O's  REASON FOR ASSESSMENT:   Consult Enteral/tube feeding initiation and management  ASSESSMENT:   41 year old female, PMHx anemia, anxiety/depression, gastric ulcer, HTN, PE and super/morbid obesity. Presented to ED with hypotension, tachycardia and fever. Had few days reported poor PO intake/diarrhea. Found to have septic shock 2/2 enlarging necrotizing wound to buttocks s/p surgical debridement 4/6.   Meal completion has been 50-100%. Pt reports having a good appetite with no other difficulties. Pt currently has Ensure ordered and has been consuming them. RD to continue with current orders.  Labs and medications reviewed.   Diet Order:  Diet regular Room service appropriate? Yes; Fluid consistency: Thin; Fluid restriction: 1200 mL Fluid Diet - low sodium heart healthy  Skin:  Wound (see comment) (Buttock abscess/Necrotizing fasciitis)  Last BM:  4/23  Height:   Ht Readings from Last 1 Encounters:  02/22/17 '5\' 4"'  (1.626 m)    Weight:   Wt Readings from Last 1 Encounters:  03/05/17 (!) 424 lb (192.3 kg)    Ideal Body Weight:  56.82 kg  BMI:  Body mass index is 72.78 kg/m.  Estimated Nutritional Needs:   Kcal:  2000-2300  Protein:  115-130 grams  Fluid:  1.2 L/day  EDUCATION NEEDS:   Education needs addressed  Corrin Parker, MS, RD, LDN Pager # [phone] After hours/ weekend pager # [phone]

## 2017-03-05 NOTE — Progress Notes (Signed)
Occupational Therapy Treatment Patient Details Name: Megan Ortiz MRN: [id number] DOB: 03-04-1976 Today's Date: 03/05/2017    History of present illness 41 year old female with PMH of anemia, anxiety/depression, asthma, gastric ulcer, HTN, PE, and morbid obesity. Presents to [facility] ED on 4/5 with hypotension, tachycardia, and temp of 101.5. States that she has had a few days of diarrhea and decreased oral intake. Upon arrival to ED it was revealed that patient had an enlarging necrotizing wound of the buttocks (Patient states she first noted an "blister" about two days ago).  Transfered from [facility] to [facility] for further management.  Post I and D of gluteal wound.  Intubated post op, Extubated 4/11.   OT comments  Pt demonstrates progress towards increased activity tolerance as seen by performing functional mobility to sink and back to bed with Min guard A and Bari-RW; pt required three rest breaks during mobility. Focused session on increasing pt's standing tolerance and decrease her anxiety with activity. Pt declined to perform grooming at this time; encouraged pt to have husband bring personal grooming items and makeup to perform ADL in which pt is interested in to increase motivation. Will continue to follow acutely to increase pt's independence in ADLs and functional mobility.    Follow Up Recommendations  CIR    Equipment Recommendations   (Defer to next venue)    Recommendations for Other Services      Precautions / Restrictions Precautions Precautions: Fall Restrictions Weight Bearing Restrictions: No       Mobility Bed Mobility Overal bed mobility: Needs Assistance Bed Mobility: Rolling;Sidelying to Sit;Supine to Sit;Sit to Supine Rolling: Min guard Sidelying to sit: Min guard Supine to sit: Min guard Sit to supine: Mod assist;+2 for physical assistance (Pt required +2 for BLE)   General bed mobility comments: Demonstrates good effort to get EOB. Pt continues to require A  for LB. May benefit from step due to height with EOB >supine  Transfers Overall transfer level: Needs assistance Equipment used: Rolling walker (2 wheeled) (Bari RW) Transfers: Sit to/from Stand Sit to Stand: Min guard;+2 safety/equipment         General transfer comment: Min guard for safety due to increase force to sit<>Stand (Dependent on momentum during tf)    Balance Overall balance assessment: Needs assistance Sitting-balance support: Feet supported;Single extremity supported Sitting balance-Leahy Scale: Fair Sitting balance - Comments: Leaned on Bari-RW for support while at EOB   Standing balance support: During functional activity;Bilateral upper extremity supported Standing balance-Leahy Scale: Poor Standing balance comment: Reliant on RW for UE support                           ADL either performed or assessed with clinical judgement   ADL Overall ADL's : Needs assistance/impaired       Grooming Details (indicate cue type and reason): Pt declined to perform grooming at this time             Lower Body Dressing: Total assistance;Bed level Lower Body Dressing Details (indicate cue type and reason): Donned socks               General ADL Comments: Pt declined to perform ADLs      Vision       Perception     Praxis      Cognition Arousal/Alertness: Awake/alert Behavior During Therapy: WFL for tasks assessed/performed;Anxious Overall Cognitive Status: Within Functional Limits for tasks assessed  Exercises     Shoulder Instructions       General Comments Pt has increase anxiety when feeling unsteady.     Pertinent Vitals/ Pain       Pain Assessment: 0-10 Pain Score: 4  Pain Location: L breast and wound area at back Pain Descriptors / Indicators: Grimacing Pain Intervention(s): Monitored during session  Home Living                                           Prior Functioning/Environment              Frequency  Min 2X/week        Progress Toward Goals  OT Goals(current goals can now be found in the care plan section)  Progress towards OT goals: Progressing toward goals  Acute Rehab OT Goals Patient Stated Goal: To go home and be normal again OT Goal Formulation: With patient Time For Goal Achievement: 03/13/17 Potential to Achieve Goals: Good ADL Goals Pt Will Perform Grooming: with set-up;with supervision;standing Pt Will Perform Upper Body Bathing: with min assist;sitting Pt Will Perform Lower Body Bathing: with mod assist;sit to/from stand Pt Will Perform Upper Body Dressing: with min assist;sitting Pt Will Transfer to Toilet: with min assist;bedside commode;ambulating  Plan Discharge plan remains appropriate    Co-evaluation    PT/OT/SLP Co-Evaluation/Treatment: Yes Reason for Co-Treatment: For patient/therapist safety;To address functional/ADL transfers PT goals addressed during session: Mobility/safety with mobility OT goals addressed during session: ADL's and self-care      End of Session Equipment Utilized During Treatment: Rolling walker  OT Visit Diagnosis: Unsteadiness on feet (R26.81);Muscle weakness (generalized) (M62.81);Pain;Other abnormalities of gait and mobility (R26.89) Pain - Right/Left:  (Wound area) Pain - part of body:  (Back)   Activity Tolerance Patient tolerated treatment well   Patient Left in bed;with call bell/phone within reach   Nurse Communication Mobility status        Time: 3419-3790 OT Time Calculation (min): 31 min  Charges: OT General Charges $OT Visit: 1 Procedure OT Treatments $Therapeutic Activity: 8-22 mins  [place], OTR/L [phone]   [place] 03/05/2017, 10:01 AM

## 2017-03-05 NOTE — Progress Notes (Signed)
PROGRESS NOTE    Megan Ortiz  YYT:[id number] DOB: 17-Apr-1976 DOA: 02/15/2017 PCP: No PCP Per Patient    Brief Narrative:  Admitted by critical care 02/16/2017 -necrotizing wound of left buttock, admit with MODS [hypotension, tachycardia, MAXIMUM TEMPERATURE 101, needing sepsis protocol, started on Levaquin 5] History of present illness 13/0.6--31/2.4 and admission Lactic acidosis 4.6, white count 42 hemoglobin 8.5 Transferred for Gen. surgery for debridement-wound grew strep viridans mixed anaerobic flora Eventually diuresed after initial volume resuscitation  Assessment & Plan:   Principal Problem:   Necrotizing soft tissue infection Active Problems:   Septic shock ([place])   Acute hypoxemic respiratory failure (HCC)   AKI (acute kidney injury) ([place])   Benign essential HTN   Uncomplicated asthma   Super-super obese (HCC)   Adjustment disorder with mixed anxiety and depressed mood   Hyperglycemia   Leukocytosis   Acute blood loss anemia  #1 acute respiratory failure secondary to sepsis with underlying diagnosis of asthma and prior tobacco.  Patient extubated 411 but required 4 L during initial recovery and weaned off. Patient on nebulizers of Pulmicort and Atrovent with clinical improvement. Follow.  #2 necrotizing fasciitis with strep very dense/buttocks abscess Patient was followed by general surgery during the hospitalization as well as by plastic surgery. Patient was seen in consultation by general surgery and underwent debridement of skin, subcutaneous tissue, muscle left gluteal and pararectal space and cultures obtained. Patient subsequently underwent dressing change of left gluteal wound under anesthesia per Dr. Donne Hazel on 02/19/2017. Patient was on empiric IV antibiotics IV Zosyn and IV clindamycin and subsequently transitioned to IV Unasyn and then to oral Augmentin. Patient currently afebrile. Patient undergoing wound dressing changes. Patient's WBC has trended down  from 40 on admission down to 10. Patient was also seen by plastic surgery and will follow-up in the outpatient setting.  #3 acute kidney injury Secondary to sepsis physiology and in the setting of ACE inhibitor/Aldactone. Patient's acute kidney injury improved and has resolved.  #4 hypokalemia/hypomagnesemia Repleted.  #5 CHF vs OHSS versus pulmonary hypertension Patient was placed on Lasix IV with good diuresis. Continue oral Lasix. Clinical improvement.  #6 anemia Status post transfusion. H&H stable.  #7 iatrogenic diarrhea Patient noted to have stools loose. Discontinued stool softener. Flexi seal placed to decrease wound contamination. Imodium x 1.Placed on questran.  #8 habitus of OHSS    DVT prophylaxis: Lovenox Code Status: Full Family Communication: Updated patient. No family at bedside. Disposition Plan: Skilled nursing facility when bed available.    Consultants:   Gen. surgery: Dr. Rosana Hoes 02/15/2017  Trauma surgery Dr. Georganna Skeans 02/16/2017  Plastic surgery Dr. Iran Planas 02/26/2017  Procedures:   Abdominal x-ray 02/16/2017  Chest x-ray 02/21/2017, 02/20/2017, 02/18/2017, 02/17/2017  Debridement of skin, subcutaneous tissue, and muscle left gluteal and pararectal space--02/16/2017 Dr Dalbert Batman -dressing change left gluteal wound, exam under anesthesia Dr Donne Hazel 02/19/2017 Transfuse 1 unit packed red blood cells 02/19/2017  Antimicrobials:   Augmentin 02/25/2017  IV Unasyn 02/22/2017 >>>>02/25/2017  IV Clindamycin 02/16/2017>>>>02/20/2017  IV Zosyn 02/16/2017>>>>> 02/22/2017  IV vancomycin 02/16/2017 x 1   Subjective: Patient with no chest pain. No shortness of breath.   Objective: Vitals:   03/04/17 2024 03/05/17 0500 03/05/17 0521 03/05/17 0825  BP: 134/69  130/74 (!) 128/47  Pulse: 95  94 74  Resp: 18  19 20   Temp: 98.5 F (36.9 C)  98.5 F (36.9 C) 98.7 F (37.1 C)  TempSrc: Oral  Oral   SpO2: 92%  94% 95%  Weight:  Marland Kitchen)  192.3 kg (424 lb)      Height:        Intake/Output Summary (Last 24 hours) at 03/05/17 1239 Last data filed at 03/05/17 0526  Gross per 24 hour  Intake              462 ml  Output             2500 ml  Net            -2038 ml   Filed Weights   03/05/17 0500  Weight: (!) 192.3 kg (424 lb)    Examination:  General exam: Appears calm and comfortable  Respiratory system: Clear to auscultation. Respiratory effort normal. Cardiovascular system: S1 & S2 heard, RRR. No JVD, murmurs, rubs, gallops or clicks. No pedal edema. Gastrointestinal system: Abdomen is nondistended, soft and nontender. No organomegaly or masses felt. Normal bowel sounds heard. Central nervous system: Alert and oriented. No focal neurological deficits. Extremities: Symmetric 5 x 5 power. Skin: Flex seal in place. Wound dressing changed.  Psychiatry: Judgement and insight appear normal. Mood & affect appropriate.     Data Reviewed: I have personally reviewed following labs and imaging studies  CBC:  Recent Labs Lab 02/28/17 0908 03/01/17 0436 03/04/17 0723  WBC 14.7* 16.1* 10.0  NEUTROABS 12.2* 13.2*  --   HGB 8.3* 8.1* 8.1*  HCT 26.5* 27.1* 27.9*  MCV 90.1 90.6 90.6  PLT 448* 482* 629*   Basic Metabolic Panel:  Recent Labs Lab 02/28/17 0908 03/01/17 0436 03/02/17 0340 03/04/17 0723 03/05/17 0619  NA 137 135  --  132* 136  K 4.2 4.5  --  3.9 3.6  CL 100* 99*  --  100* 101  CO2 27 26  --  23 27  GLUCOSE 110* 118*  --  117* 105*  BUN 14 14  --  10 10  CREATININE 1.11* 1.15* 1.23* 0.89 0.87  CALCIUM 9.0 8.7*  --  8.6* 8.9  MG 2.3  --   --  2.3  --    GFR: Estimated Creatinine Clearance: 147.4 mL/min (by C-G formula based on SCr of 0.87 mg/dL). Liver Function Tests: No results for input(s): AST, ALT, ALKPHOS, BILITOT, PROT, ALBUMIN in the last 168 hours. No results for input(s): LIPASE, AMYLASE in the last 168 hours. No results for input(s): AMMONIA in the last 168 hours. Coagulation Profile: No results for  input(s): INR, PROTIME in the last 168 hours. Cardiac Enzymes: No results for input(s): CKTOTAL, CKMB, CKMBINDEX, TROPONINI in the last 168 hours. BNP (last 3 results) No results for input(s): PROBNP in the last 8760 hours. HbA1C: No results for input(s): HGBA1C in the last 72 hours. CBG:  Recent Labs Lab 02/26/17 2123 02/27/17 0811  GLUCAP 103* 116*   Lipid Profile: No results for input(s): CHOL, HDL, LDLCALC, TRIG, CHOLHDL, LDLDIRECT in the last 72 hours. Thyroid Function Tests: No results for input(s): TSH, T4TOTAL, FREET4, T3FREE, THYROIDAB in the last 72 hours. Anemia Panel: No results for input(s): VITAMINB12, FOLATE, FERRITIN, TIBC, IRON, RETICCTPCT in the last 72 hours. Sepsis Labs: No results for input(s): PROCALCITON, LATICACIDVEN in the last 168 hours.  No results found for this or any previous visit (from the past 240 hour(s)).       Radiology Studies: No results found.      Scheduled Meds: . acetaminophen  1,000 mg Oral Q8H  . budesonide (PULMICORT) nebulizer solution  0.5 mg Nebulization BID  . cholestyramine light  4 g Oral BID  .  enoxaparin (LOVENOX) injection  100 mg Subcutaneous Q24H  . feeding supplement (ENSURE ENLIVE)  237 mL Oral TID BM  . furosemide  40 mg Oral BID  . mouth rinse  15 mL Mouth Rinse BID  . pantoprazole  40 mg Oral QHS  . potassium chloride  40 mEq Oral TID  . sertraline  100 mg Oral BID   Continuous Infusions:    LOS: 17 days    Time spent: [place], MD [facility] Pager [phone]  If 7PM-7AM, please contact night-coverage [email or web address] Password [facility] 03/05/2017, 12:39 PM

## 2017-03-05 NOTE — Progress Notes (Signed)
Physical Therapy Treatment Patient Details Name: Megan Ortiz MRN: [id number] DOB: 31-Mar-1976 Today's Date: 03/05/2017    History of Present Illness 41 year old female with PMH of anemia, anxiety/depression, asthma, gastric ulcer, HTN, PE, and morbid obesity. Presents to [facility] ED on 4/5 with hypotension, tachycardia, and temp of 101.5. States that she has had a few days of diarrhea and decreased oral intake. Upon arrival to ED it was revealed that patient had an enlarging necrotizing wound of the buttocks (Patient states she first noted an "blister" about two days ago).  Transfered from [facility] to [facility] for further management.  Post I and D of gluteal wound.  Intubated post op, Extubated 4/11.    PT Comments    Continuing work on functional mobility and activity tolerance; Showing slow, but steady progress, walking a bit more each session; Difficulty feeling secure in bariatric recliner as she has a difficult time getting her hips all the way back in the recliner; Will pay careful attention to getting the seat to its lowest position next session before getting into recliner; Will also consider using the Bari step (lay it down in front of chair after she sits) to give her feet a stable surface to help push her feet back;   Discussed Ms. Barritt's situation with [name], LCSW; Pt is aware of plan for SNF post-acutely, and agreeable to plan in her conversations with SW; She is also very difficult to place at [facility]; During sessions with PT, pt tells me she just wants to get home;  I think it might be worth reconsidering CIR; she is young, was independent managing in her home (and occasionally getting out to festivals) PTA, making progress with PT (slow, but steady), and if given the opportunity for intensive therapies at CIR, would likely make enough functional progress to be able to get home with occasional assist from her husband (he has ESRD and is on HD); If the plan remains for SNF, is there a way to  ensure that she will receive PT and OT there?   Follow Up Recommendations  Other (comment) (Worth re-considering CIR, see above)     Equipment Recommendations  Rolling walker with 5" wheels (bariatric)    Recommendations for Other Services       Precautions / Restrictions Precautions Precautions: Fall Restrictions Weight Bearing Restrictions: No    Mobility  Bed Mobility Overal bed mobility: Needs Assistance Bed Mobility: Rolling;Sidelying to Sit;Supine to Sit;Sit to Supine Rolling: Min guard Sidelying to sit: Min guard Supine to sit: Min guard Sit to supine: Mod assist;+2 for physical assistance (Pt required +2 for BLE)   General bed mobility comments: Demonstrates good effort to get EOB. Noting dependnt on momentum with rolling; Pt continues to require A for LB. May benefit from step due to height with EOB >supine  Transfers Overall transfer level: Needs assistance Equipment used: Rolling walker (2 wheeled) (Bari RW) Transfers: Sit to/from Stand Sit to Stand: Min guard;+2 safety/equipment         General transfer comment: Min guard for safety due to increase dependence on momentum to sit<>Stand; Stabilized RW  Ambulation/Gait Ambulation/Gait assistance: Min assist;+2 physical assistance Ambulation Distance (Feet): 15 Feet (10+5) Assistive device: Rolling walker (2 wheeled) Gait Pattern/deviations: Shuffle;Trunk flexed Gait velocity: decreased Gait velocity interpretation: Below normal speed for age/gender General Gait Details: Cues to stay within walker and to slow down when attempting to sit in recliner. Close guard for safety; two standing rest breaks during which pt propped elbows on  recliner   Stairs            Wheelchair Mobility    Modified Rankin (Stroke Patients Only)       Balance Overall balance assessment: Needs assistance Sitting-balance support: Feet supported;Single extremity supported Sitting balance-Leahy Scale: Fair Sitting  balance - Comments: Leaned on Bari-RW for support while at EOB   Standing balance support: During functional activity;Bilateral upper extremity supported Standing balance-Leahy Scale: Poor Standing balance comment: Reliant on RW for UE support                            Cognition Arousal/Alertness: Awake/alert Behavior During Therapy: WFL for tasks assessed/performed;Anxious Overall Cognitive Status: Within Functional Limits for tasks assessed                                        Exercises      General Comments General comments (skin integrity, edema, etc.): Difficulty getting pt to a position in the [place] when she felt secure      Pertinent Vitals/Pain Pain Assessment: 0-10 Pain Score: 4  Pain Location: L breast and wound area at back Pain Descriptors / Indicators: Grimacing Pain Intervention(s): Monitored during session    Home Living                      Prior Function            PT Goals (current goals can now be found in the care plan section) Acute Rehab PT Goals Patient Stated Goal: To go home and be normal again PT Goal Formulation: With patient Time For Goal Achievement: 03/09/17 Potential to Achieve Goals: Good Progress towards PT goals: Progressing toward goals (Slowly)    Frequency    Min 3X/week      PT Plan Current plan remains appropriate;Other (comment)    Co-evaluation PT/OT/SLP Co-Evaluation/Treatment: Yes Reason for Co-Treatment: For patient/therapist safety PT goals addressed during session: Mobility/safety with mobility OT goals addressed during session: ADL's and self-care     End of Session   Activity Tolerance: Patient tolerated treatment well Patient left: in bed;with call bell/phone within reach Nurse Communication: Mobility status PT Visit Diagnosis: Other abnormalities of gait and mobility (R26.89);Muscle weakness (generalized) (M62.81);Difficulty in walking, not elsewhere  classified (R26.2)     Time: 4235-3614 PT Time Calculation (min) (ACUTE ONLY): 31 min  Charges:  $Gait Training: 8-22 mins                    G Codes:       Roney Marion, [place]  Acute Rehabilitation Services Pager [phone] Office [place] 03/05/2017, 12:02 PM

## 2017-03-06 LAB — BASIC METABOLIC PANEL
Anion gap: 9 (ref 5–15)
BUN: 12 mg/dL (ref 6–20)
CO2: 28 mmol/L (ref 22–32)
Calcium: 8.9 mg/dL (ref 8.9–10.3)
Chloride: 98 mmol/L — ABNORMAL LOW (ref 101–111)
Creatinine, Ser: 0.82 mg/dL (ref 0.44–1.00)
GFR calc Af Amer: >60 mL/min (ref >60)
GFR calc non Af Amer: >60 mL/min (ref >60)
GLUCOSE: 106 mg/dL — AB (ref 65–99)
POTASSIUM: 3.7 mmol/L (ref 3.5–5.1)
Sodium: 135 mmol/L (ref 135–145)

## 2017-03-06 LAB — CBC WITH DIFFERENTIAL/PLATELET
Basophils Absolute: 0 10*3/uL (ref 0.0–0.1)
Basophils Relative: 0 %
EOS PCT: 3 %
Eosinophils Absolute: 0.2 10*3/uL (ref 0.0–0.7)
HCT: 27.2 % — ABNORMAL LOW (ref 36.0–46.0)
Hemoglobin: 7.9 g/dL — ABNORMAL LOW (ref 12.0–15.0)
LYMPHS ABS: 1.5 10*3/uL (ref 0.7–4.0)
Lymphocytes Relative: 19 %
MCH: 26.1 pg (ref 26.0–34.0)
MCHC: 29 g/dL — ABNORMAL LOW (ref 30.0–36.0)
MCV: 89.8 fL (ref 78.0–100.0)
MONO ABS: 0.7 10*3/uL (ref 0.1–1.0)
Monocytes Relative: 9 %
Neutro Abs: 5.4 10*3/uL (ref 1.7–7.7)
Neutrophils Relative %: 69 %
PLATELETS: 576 10*3/uL — AB (ref 150–400)
RBC: 3.03 MIL/uL — ABNORMAL LOW (ref 3.87–5.11)
RDW: 18.2 % — ABNORMAL HIGH (ref 11.5–15.5)
WBC: 7.8 10*3/uL (ref 4.0–10.5)

## 2017-03-06 NOTE — Progress Notes (Signed)
PROGRESS NOTE    Megan Ortiz  RFF:[id number] DOB: [birth year] DOA: 02/15/2017 PCP: No PCP Per Patient    Brief Narrative:  Admitted by critical care 02/16/2017 -necrotizing wound of left buttock, admit with MODS [hypotension, tachycardia, MAXIMUM TEMPERATURE 101, needing sepsis protocol, started on Levaquin 5] History of present illness 13/0.6--31/2.4 and admission Lactic acidosis 4.6, white count 42 hemoglobin 8.5 Transferred for Gen. surgery for debridement-wound grew strep viridans mixed anaerobic flora Eventually diuresed after initial volume resuscitation  Assessment & Plan:   Principal Problem:   Necrotizing soft tissue infection Active Problems:   Septic shock ([place])   Acute hypoxemic respiratory failure (HCC)   AKI (acute kidney injury) ([place])   Benign essential HTN   Uncomplicated asthma   Super-super obese (HCC)   Adjustment disorder with mixed anxiety and depressed mood   Hyperglycemia   Leukocytosis   Acute blood loss anemia  #1 acute respiratory failure secondary to sepsis with underlying diagnosis of asthma and prior tobacco.  Patient extubated 411 but required 4 L during initial recovery and weaned off. Patient on nebulizers of Pulmicort and Atrovent with clinical improvement. Follow.  #2 necrotizing fasciitis with strep very dense/buttocks abscess Patient was followed by general surgery during the hospitalization as well as by plastic surgery. Patient was seen in consultation by general surgery and underwent debridement of skin, subcutaneous tissue, muscle left gluteal and pararectal space and cultures obtained. Patient subsequently underwent dressing change of left gluteal wound under anesthesia per Dr. Donne Hazel on 02/19/2017. Patient was on empiric IV antibiotics IV Zosyn and IV clindamycin and subsequently transitioned to IV Unasyn and then to oral Augmentin. Patient finished course of antibiotics. Patient currently afebrile. Patient undergoing wound dressing  changes. Patient's WBC has trended down from 40 on admission down to 7.8. Patient was also seen by plastic surgery and will follow-up in the outpatient setting.  #3 acute kidney injury Secondary to sepsis physiology and in the setting of ACE inhibitor/Aldactone. Patient's acute kidney injury improved and has resolved.  #4 hypokalemia/hypomagnesemia Repleted.  #5 CHF vs OHSS versus pulmonary hypertension Patient was placed on Lasix IV with good diuresis. Continue oral Lasix. Clinical improvement.  #6 anemia Status post transfusion. H&H stable.  #7 iatrogenic diarrhea Patient noted to have stools loose. Discontinued stool softener. Flexi seal placed to decrease wound contamination. Will give another dose of Imodium. Continue Questran.   #8 habitus of OHSS    DVT prophylaxis: Lovenox Code Status: Full Family Communication: Updated patient and husband at bedside. Disposition Plan: Skilled nursing facility when bed available.    Consultants:   Gen. surgery: Dr. Rosana Hoes 02/15/2017  Trauma surgery Dr. Georganna Skeans 02/16/2017  Plastic surgery Dr. Iran Planas 02/26/2017  Procedures:   Abdominal x-ray 02/16/2017  Chest x-ray 02/21/2017, 02/20/2017, 02/18/2017, 02/17/2017  Debridement of skin, subcutaneous tissue, and muscle left gluteal and pararectal space--02/16/2017 Dr Dalbert Batman -dressing change left gluteal wound, exam under anesthesia Dr Donne Hazel 02/19/2017 Transfuse 1 unit packed red blood cells 02/19/2017  Antimicrobials:   Augmentin 02/25/2017>>>> 03/01/2017  IV Unasyn 02/22/2017 >>>>02/25/2017  IV Clindamycin 02/16/2017>>>>02/20/2017  IV Zosyn 02/16/2017>>>>> 02/22/2017  IV vancomycin 02/16/2017 x 1   Subjective: Patient denies any shortness of breath. Patient denies any chest pain.   Objective: Vitals:   03/05/17 1926 03/05/17 2015 03/06/17 0527 03/06/17 1040  BP: 133/63  (!) 139/59 136/82  Pulse: 85  86 (!) 103  Resp: 17  18 17   Temp: 98.2 F (36.8 C)  97.2 F (36.2  C) 97.9 F (36.6 C)  TempSrc: Oral  Oral Oral  SpO2: 95% 95% 95% 94%  Weight:      Height:        Intake/Output Summary (Last 24 hours) at 03/06/17 1412 Last data filed at 03/06/17 1409  Gross per 24 hour  Intake              840 ml  Output             2650 ml  Net            -1810 ml   Filed Weights   03/05/17 0500  Weight: (!) 192.3 kg (424 lb)    Examination:  General exam: NAD Respiratory system: Clear to auscultation.Distant breath sounds due to body habitus. Respiratory effort normal. Cardiovascular system: S1 & S2 heard, RRR. Distant heart sounds secondary to body habitus. No JVD, murmurs, rubs, gallops or clicks. No pedal edema. Gastrointestinal system: Abdomen is nondistended, soft and nontender. Obese. No organomegaly or masses felt. Normal bowel sounds heard. Central nervous system: Alert and oriented. No focal neurological deficits. Extremities: Symmetric 5 x 5 power. Skin: Flex seal in place. Wound dressing changed.  Psychiatry: Judgement and insight appear normal. Mood & affect appropriate.     Data Reviewed: I have personally reviewed following labs and imaging studies  CBC:  Recent Labs Lab 02/28/17 0908 03/01/17 0436 03/04/17 0723 03/06/17 0330  WBC 14.7* 16.1* 10.0 7.8  NEUTROABS 12.2* 13.2*  --  5.4  HGB 8.3* 8.1* 8.1* 7.9*  HCT 26.5* 27.1* 27.9* 27.2*  MCV 90.1 90.6 90.6 89.8  PLT 448* 482* 565* 073*   Basic Metabolic Panel:  Recent Labs Lab 02/28/17 0908 03/01/17 0436 03/02/17 0340 03/04/17 0723 03/05/17 0619 03/06/17 0330  NA 137 135  --  132* 136 135  K 4.2 4.5  --  3.9 3.6 3.7  CL 100* 99*  --  100* 101 98*  CO2 27 26  --  23 27 28   GLUCOSE 110* 118*  --  117* 105* 106*  BUN 14 14  --  10 10 12   CREATININE 1.11* 1.15* 1.23* 0.89 0.87 0.82  CALCIUM 9.0 8.7*  --  8.6* 8.9 8.9  MG 2.3  --   --  2.3  --   --    GFR: Estimated Creatinine Clearance: 156.4 mL/min (by C-G formula based on SCr of 0.82 mg/dL). Liver Function  Tests: No results for input(s): AST, ALT, ALKPHOS, BILITOT, PROT, ALBUMIN in the last 168 hours. No results for input(s): LIPASE, AMYLASE in the last 168 hours. No results for input(s): AMMONIA in the last 168 hours. Coagulation Profile: No results for input(s): INR, PROTIME in the last 168 hours. Cardiac Enzymes: No results for input(s): CKTOTAL, CKMB, CKMBINDEX, TROPONINI in the last 168 hours. BNP (last 3 results) No results for input(s): PROBNP in the last 8760 hours. HbA1C: No results for input(s): HGBA1C in the last 72 hours. CBG: No results for input(s): GLUCAP in the last 168 hours. Lipid Profile: No results for input(s): CHOL, HDL, LDLCALC, TRIG, CHOLHDL, LDLDIRECT in the last 72 hours. Thyroid Function Tests: No results for input(s): TSH, T4TOTAL, FREET4, T3FREE, THYROIDAB in the last 72 hours. Anemia Panel: No results for input(s): VITAMINB12, FOLATE, FERRITIN, TIBC, IRON, RETICCTPCT in the last 72 hours. Sepsis Labs: No results for input(s): PROCALCITON, LATICACIDVEN in the last 168 hours.  No results found for this or any previous visit (from the past 240 hour(s)).       Radiology Studies: No results found.  Scheduled Meds: . acetaminophen  1,000 mg Oral Q8H  . budesonide (PULMICORT) nebulizer solution  0.5 mg Nebulization BID  . cholestyramine light  4 g Oral BID  . enoxaparin (LOVENOX) injection  100 mg Subcutaneous Q24H  . feeding supplement (ENSURE ENLIVE)  237 mL Oral TID BM  . furosemide  40 mg Oral BID  . mouth rinse  15 mL Mouth Rinse BID  . pantoprazole  40 mg Oral QHS  . potassium chloride  40 mEq Oral TID  . sertraline  100 mg Oral BID   Continuous Infusions:    LOS: 18 days    Time spent: [place], MD [facility] Pager [phone]  If 7PM-7AM, please contact night-coverage [email or web address] Password [email or web address] 03/06/2017, 2:12 PM

## 2017-03-06 NOTE — Progress Notes (Signed)
  Plastic Surgery 4.24.2018 [place]-inpatient  Temp:  [97.2 F (36.2 C)-98.5 F (36.9 C)] 97.9 F (36.6 C) (04/24 1040) Pulse Rate:  [85-103] 103 (04/24 1040) Resp:  [17-18] 17 (04/24 1040) BP: (133-140)/(59-82) 136/82 (04/24 1040) SpO2:  [94 %-96 %] 94 % (04/24 1040)   Patient's husband at bedside. Patient reports still has loose stools, feels less. Completed antibiotic course.   PE Wound clean, 100% granulation and flush with skin margins No significant neoepithelization of wound margins Stool contamination wound   A/P left thigh wound, morbid obesity  Patient much more independent, able to position in bed with minimal assist at this time.   Patient has redundancy soft tissue and much less edema at this time, can manually approximate wound margins. Discussed primary closure wound- my concerns are similar to our discussion of skin graft, namely iIn present setting of multiple BM contamination per day, any surgical closure will be high risk not healing and may not worth surgical risk to her given her obesity.   Continued loose stools - unclear source as off antibiotics, stool softeners for several days.  Patient has progressed with PT considerably, would recommend we target home if patient able to stand to shower post each BM, and if she and husband can do dressing over wound. Encouraged husband to watch staff complete dressing change while here today, and to let team know if he could complete this.  Irene Limbo, MD [facility] Plastic & Reconstructive Surgery [phone], pin [phone]

## 2017-03-06 NOTE — Clinical Social Work Note (Signed)
Discharge recommendation is SNF placement as patient has wounds that she and spouse cannot care for at home. Ms. Meinhart has been classified as "Difficult to Place (DTP)" and CSW continuing to work on an appropriate placement for patient with the assistance of Assistant Social Work Mudlogger, Passenger transport manager and Market researcher Dr. Reynaldo Minium.  Damichael Hofman Givens, MSW, LCSW Licensed Clinical Social Worker [place] [phone]

## 2017-03-06 NOTE — Progress Notes (Addendum)
Physical Therapy Treatment Patient Details Name: Megan Ortiz MRN: [id number] DOB: 1976/09/27 Today's Date: 03/06/2017    History of Present Illness 41 year old female with PMH of anemia, anxiety/depression, asthma, gastric ulcer, HTN, PE, and morbid obesity. Presents to [facility] ED on 4/5 with hypotension, tachycardia, and temp of 101.5. States that she has had a few days of diarrhea and decreased oral intake. Upon arrival to ED it was revealed that patient had an enlarging necrotizing wound of the buttocks (Patient states she first noted an "blister" about two days ago).  Transfered from [facility] to [facility] for further management.  Post I and D of gluteal wound.  Intubated post op, Extubated 4/11.    PT Comments    Continuing work on functional mobility and activity tolerance; Focus on today's session was giving Ms. Drouillard options for positioning other than supine in bed; Initially, Kaliana was hesitant to move, stating she feels lousy, and requesting heartburn medication; After discussion of plans, dc considerations, and gentle encouragement, Lulu agreed to work on transfers; ultimately, we ended session with her sitting at EOB, feet propped on Bari-step, and rolling table close (took some time to problem-solve positioning); Encouraged pt to eat meals sitting up.  I continue to pose that CIR is worth consideration; she is making progress (slow, but steady); per Dr. Para Skeans note on 4/20: "Patient preference is to return home. States she feels safer there and has had wounds in past (reports abdominal incision packing post cholecystectomy) and completed care. If patient is able to stand to shower post each BM, and if she and husband can do dressing over wound, she could return home from my perspective. I do not feel she can do that today [4/20], but she has made progress over last few days." This is a very appropriate functional goal that she could work toward at [employer]   Follow Up  Recommendations  Other (comment) (post-acute rehab)     Equipment Recommendations  Rolling walker with 5" wheels (bariatric)    Recommendations for Other Services       Precautions / Restrictions Precautions Precautions: Fall Precaution Comments: Slso with flexiseal and foley Restrictions Weight Bearing Restrictions: No    Mobility  Bed Mobility Overal bed mobility: Needs Assistance Bed Mobility: Rolling;Sidelying to Sit;Supine to Sit Rolling: Min guard Sidelying to sit: Min guard Supine to sit: Min guard     General bed mobility comments: Demonstrates good effort to get EOB; reliant on rails; Noting dependence on momentum with rolling; Overall good trunk mobility and rotation, given body habitus  Transfers Overall transfer level: Needs assistance Equipment used:  Warden/ranger facing pt, used bilateral poles to stabilize) Transfers: Sit to/from Stand Sit to Stand: Min assist;+2 safety/equipment         General transfer comment: Min assist of 2 and use of bed pad to help with rise of hips; ; stood on bari-step; performed 3 reps; very anxious, but still participating with gentle encouragement  Ambulation/Gait Ambulation/Gait assistance: +2 physical assistance;Min guard Ambulation Distance (Feet):  (Sidesteps on bari-step to head of bed) Assistive device:  (holding poles of bari-chair) Gait Pattern/deviations: Shuffle     General Gait Details: sidestep for better positioning back to bed   Stairs            Wheelchair Mobility    Modified Rankin (Stroke Patients Only)       Balance     Sitting balance-Leahy Scale: Good       Standing balance-Leahy Scale: Poor Standing  balance comment: Reliant on bil UE support                            Cognition Arousal/Alertness: Awake/alert Behavior During Therapy: WFL for tasks assessed/performed;Anxious Overall Cognitive Status: Within Functional Limits for tasks assessed                                         Exercises      General Comments        Pertinent Vitals/Pain Pain Assessment: 0-10 Pain Score: 2  Pain Location: L breast and wound area at back (reports heartburn as well; RN notified) Pain Descriptors / Indicators: Grimacing Pain Intervention(s): Monitored during session    Home Living                      Prior Function            PT Goals (current goals can now be found in the care plan section) Acute Rehab PT Goals PT Goal Formulation: With patient Time For Goal Achievement: 03/09/17 Potential to Achieve Goals: Good Progress towards PT goals: Progressing toward goals (slowly)    Frequency    Min 3X/week      PT Plan Current plan remains appropriate    Co-evaluation             End of Session   Activity Tolerance: Patient tolerated treatment well;Other (comment) (though a bit nauseated and with heartburn) Patient left: in bed;with call bell/phone within reach;Other (comment) (Sitting EOB with feet supoprted on Bari-step) Nurse Communication: Mobility status PT Visit Diagnosis: Other abnormalities of gait and mobility (R26.89);Muscle weakness (generalized) (M62.81);Difficulty in walking, not elsewhere classified (R26.2)     Time: 5681-2751 PT Time Calculation (min) (ACUTE ONLY): 45 min  Charges:  $Therapeutic Activity: 38-52 mins                    G Codes:       Roney Marion, PT  Acute Rehabilitation Services Pager [phone] Office [phone]    [name] 03/06/2017, 10:37 AM

## 2017-03-07 MED ORDER — CHOLESTYRAMINE LIGHT 4 G PO PACK
4.0000 g | PACK | Freq: Four times a day (QID) | ORAL | Status: DC
  Administered 2017-03-07 – 2017-03-09 (×7): 4 g via ORAL
  Filled 2017-03-07 (×10): qty 1

## 2017-03-07 MED ORDER — LOPERAMIDE HCL 2 MG PO CAPS
4.0000 mg | ORAL_CAPSULE | Freq: Once | ORAL | Status: AC
  Administered 2017-03-07: 4 mg via ORAL
  Filled 2017-03-07: qty 2

## 2017-03-07 NOTE — Progress Notes (Signed)
PROGRESS NOTE    Megan Ortiz  ZOX:[id number] DOB: 1976/10/10 DOA: 02/15/2017 PCP: No PCP Per Patient    Brief Narrative:  Admitted by critical care 02/16/2017 -necrotizing wound of left buttock, admit with MODS [hypotension, tachycardia, MAXIMUM TEMPERATURE 101, needing sepsis protocol, started on Levaquin 5] History of present illness 13/0.6--31/2.4 and admission Lactic acidosis 4.6, white count 42 hemoglobin 8.5 Transferred for Gen. surgery for debridement-wound grew strep viridans mixed anaerobic flora Eventually diuresed after initial volume resuscitation  Assessment & Plan:   Principal Problem:   Necrotizing soft tissue infection Active Problems:   Septic shock ([place])   Acute hypoxemic respiratory failure (HCC)   AKI (acute kidney injury) ([place])   Benign essential HTN   Uncomplicated asthma   Super-super obese (HCC)   Adjustment disorder with mixed anxiety and depressed mood   Hyperglycemia   Leukocytosis   Acute blood loss anemia  #1 acute respiratory failure secondary to sepsis with underlying diagnosis of asthma and prior tobacco.  Patient extubated 4/11 but required 4 L during initial recovery and weaned off. Patient on nebulizers and Pulmicort and Atrovent with clinical improvement. Improved. Follow.  #2 necrotizing fasciitis with strep very dense/buttocks abscess Patient was followed by general surgery during the hospitalization as well as by plastic surgery. Patient was seen in consultation by general surgery and underwent debridement of skin, subcutaneous tissue, muscle left gluteal and pararectal space and cultures obtained. Patient subsequently underwent dressing change of left gluteal wound under anesthesia per Dr. Donne Hazel on 02/19/2017. Patient was on empiric IV antibiotics IV Zosyn and IV clindamycin and subsequently transitioned to IV Unasyn and then to oral Augmentin. Patient finished course of antibiotics. Patient currently afebrile. Patient undergoing wound  dressing changes. Patient's WBC has trended down from 40 on admission down to 7.8. Patient was also seen by plastic surgery and will follow-up in the outpatient setting.  #3 acute kidney injury Secondary to sepsis physiology and in the setting of ACE inhibitor/Aldactone. Patient's acute kidney injury improved and has resolved.  #4 hypokalemia/hypomagnesemia Repleted.  #5 CHF vs OHSS versus pulmonary hypertension Patient was placed on Lasix IV with good diuresis. IV Lasix changed to oral Lasix. Clinical improvement.  #6 anemia Status post transfusion. H&H stable.  #7 iatrogenic diarrhea Patient noted to have stools loose. Discontinued stool softener. Flexi seal placed to decrease wound contamination. Will give another dose of Imodium. Continue Questran.   #8 habitus of OHSS    DVT prophylaxis: Lovenox Code Status: Full Family Communication: Updated patient and husband at bedside. Disposition Plan: Skilled nursing facility when bed available.    Consultants:   Gen. surgery: Dr. Rosana Hoes 02/15/2017  Trauma surgery Dr. Georganna Skeans 02/16/2017  Plastic surgery Dr. Iran Planas 02/26/2017  Procedures:   Abdominal x-ray 02/16/2017  Chest x-ray 02/21/2017, 02/20/2017, 02/18/2017, 02/17/2017  Debridement of skin, subcutaneous tissue, and muscle left gluteal and pararectal space--02/16/2017 Dr Dalbert Batman -dressing change left gluteal wound, exam under anesthesia Dr Donne Hazel 02/19/2017 Transfuse 1 unit packed red blood cells 02/19/2017  Antimicrobials:   Augmentin 02/25/2017>>>> 03/01/2017  IV Unasyn 02/22/2017 >>>>02/25/2017  IV Clindamycin 02/16/2017>>>>02/20/2017  IV Zosyn 02/16/2017>>>>> 02/22/2017  IV vancomycin 02/16/2017 x 1   Subjective: Patient denies SOB. No CP.   Objective: Vitals:   03/06/17 2044 03/06/17 2148 03/07/17 0601 03/07/17 0831  BP:  126/85 135/68   Pulse:  78 74   Resp:  18 19   Temp:  98.2 F (36.8 C) 98.6 F (37 C)   TempSrc:  Oral Oral   SpO2:  96% 97%  98% 93%  Weight:      Height:        Intake/Output Summary (Last 24 hours) at 03/07/17 0954 Last data filed at 03/07/17 0601  Gross per 24 hour  Intake              840 ml  Output             1500 ml  Net             -660 ml   Filed Weights   03/05/17 0500  Weight: (!) 192.3 kg (424 lb)    Examination:  General exam: NAD Respiratory system: Clear to auscultation. Distant breath sounds due to body habitus. Respiratory effort normal. Cardiovascular system: S1 & S2 heard, RRR. Distant heart sounds secondary to body habitus. No JVD, murmurs, rubs, gallops or clicks. No pedal edema. Gastrointestinal system: Abdomen is nondistended, soft and nontender. Obese. No organomegaly or masses felt. Normal bowel sounds heard. Central nervous system: Alert and oriented. No focal neurological deficits. Extremities: Symmetric 5 x 5 power. Skin: Flex seal in place. Wound dressing changed.  Psychiatry: Judgement and insight appear normal. Mood & affect appropriate.     Data Reviewed: I have personally reviewed following labs and imaging studies  CBC:  Recent Labs Lab 03/01/17 0436 03/04/17 0723 03/06/17 0330  WBC 16.1* 10.0 7.8  NEUTROABS 13.2*  --  5.4  HGB 8.1* 8.1* 7.9*  HCT 27.1* 27.9* 27.2*  MCV 90.6 90.6 89.8  PLT 482* 565* 048*   Basic Metabolic Panel:  Recent Labs Lab 03/01/17 0436 03/02/17 0340 03/04/17 0723 03/05/17 0619 03/06/17 0330  NA 135  --  132* 136 135  K 4.5  --  3.9 3.6 3.7  CL 99*  --  100* 101 98*  CO2 26  --  23 27 28   GLUCOSE 118*  --  117* 105* 106*  BUN 14  --  10 10 12   CREATININE 1.15* 1.23* 0.89 0.87 0.82  CALCIUM 8.7*  --  8.6* 8.9 8.9  MG  --   --  2.3  --   --    GFR: Estimated Creatinine Clearance: 156.4 mL/min (by C-G formula based on SCr of 0.82 mg/dL). Liver Function Tests: No results for input(s): AST, ALT, ALKPHOS, BILITOT, PROT, ALBUMIN in the last 168 hours. No results for input(s): LIPASE, AMYLASE in the last 168 hours. No  results for input(s): AMMONIA in the last 168 hours. Coagulation Profile: No results for input(s): INR, PROTIME in the last 168 hours. Cardiac Enzymes: No results for input(s): CKTOTAL, CKMB, CKMBINDEX, TROPONINI in the last 168 hours. BNP (last 3 results) No results for input(s): PROBNP in the last 8760 hours. HbA1C: No results for input(s): HGBA1C in the last 72 hours. CBG: No results for input(s): GLUCAP in the last 168 hours. Lipid Profile: No results for input(s): CHOL, HDL, LDLCALC, TRIG, CHOLHDL, LDLDIRECT in the last 72 hours. Thyroid Function Tests: No results for input(s): TSH, T4TOTAL, FREET4, T3FREE, THYROIDAB in the last 72 hours. Anemia Panel: No results for input(s): VITAMINB12, FOLATE, FERRITIN, TIBC, IRON, RETICCTPCT in the last 72 hours. Sepsis Labs: No results for input(s): PROCALCITON, LATICACIDVEN in the last 168 hours.  No results found for this or any previous visit (from the past 240 hour(s)).       Radiology Studies: No results found.      Scheduled Meds: . acetaminophen  1,000 mg Oral Q8H  . budesonide (PULMICORT) nebulizer solution  0.5 mg  Nebulization BID  . cholestyramine light  4 g Oral BID  . enoxaparin (LOVENOX) injection  100 mg Subcutaneous Q24H  . feeding supplement (ENSURE ENLIVE)  237 mL Oral TID BM  . furosemide  40 mg Oral BID  . mouth rinse  15 mL Mouth Rinse BID  . pantoprazole  40 mg Oral QHS  . potassium chloride  40 mEq Oral TID  . sertraline  100 mg Oral BID   Continuous Infusions:    LOS: 19 days    Time spent: S[place], MD [facility] Pager [phone]  If 7PM-7AM, please contact night-coverage [email or web address] Password [facility] 03/07/2017, 9:54 AM

## 2017-03-07 NOTE — Progress Notes (Signed)
Occupational Therapy Treatment Patient Details Name: Megan Ortiz MRN: [id number] DOB: 10/21/1976 Today's Date: 03/07/2017    History of present illness 41 year old female with PMH of anemia, anxiety/depression, asthma, gastric ulcer, HTN, PE, and morbid obesity. Presents to [facility] ED on 4/5 with hypotension, tachycardia, and temp of 101.5. States that she has had a few days of diarrhea and decreased oral intake. Upon arrival to ED it was revealed that patient had an enlarging necrotizing wound of the buttocks (Patient states she first noted an "blister" about two days ago).  Transfered from [facility] to [facility] for further management.  Post I and D of gluteal wound.  Intubated post op, Extubated 4/11.   OT comments  Pt demonstrating progress to increasing occupational performance and participation. Pt performed grooming at sink with set up A while seated on bari-bsc. Pt performed UB bathing while seated at sink with Mod A to bath back. Will continue to follow acutely to increase pt independence with ADLs. Continue to recommend dc to post-acute rehab to increase pt's safety and independence with ADLs and functional mobility.   Follow Up Recommendations  Other (post-acute rehab)    Equipment Recommendations  Other (comment) (Defer to next venue)    Recommendations for Other Services      Precautions / Restrictions Precautions Precautions: Fall Restrictions Weight Bearing Restrictions: No       Mobility Bed Mobility Overal bed mobility: Needs Assistance Bed Mobility: Rolling;Sidelying to Sit;Supine to Sit;Sit to Supine Rolling: Min guard Sidelying to sit: Min guard Supine to sit: Min guard Sit to supine: Mod assist;+2 for physical assistance (Used step to provided pt with BLE support to push herself up)   General bed mobility comments: Demonstrates good effort to get EOB. Pt continues to require A for LB. Used step to A with EOB >supine  Transfers Overall transfer level: Needs  assistance Equipment used: Rolling walker (2 wheeled) (Bari RW) Transfers: Sit to/from Stand Sit to Stand: Min guard;+2 safety/equipment         General transfer comment: Min guard for safety due to increase force to sit<>Stand (Dependent on momentum during tf)    Balance Overall balance assessment: Needs assistance Sitting-balance support: Feet supported;Single extremity supported Sitting balance-Leahy Scale: Fair Sitting balance - Comments: Leaned on Bari-RW for support while at EOB   Standing balance support: During functional activity;Bilateral upper extremity supported Standing balance-Leahy Scale: Poor Standing balance comment: Reliant on RW for UE support                           ADL either performed or assessed with clinical judgement   ADL Overall ADL's : Needs assistance/impaired     Grooming: Sitting;Brushing hair;Oral care;Wash/dry face;Set up;Supervision/safety;Applying deodorant Grooming Details (indicate cue type and reason): Pt performed grooming at sink while seated on Bari-BSC. Pt relied heavily on sink for BUE support during bilateral tasks such as oral care. Pt reports that she feels more secure on BSC than bari-recliner. Pt making steady progress Upper Body Bathing: Moderate assistance;Sitting Upper Body Bathing Details (indicate cue type and reason): Pt performed UB bathing of front while seated on bari-BSC at sink. provided A for back.          Lower Body Dressing: Total assistance;Bed level Lower Body Dressing Details (indicate cue type and reason): Donned socks Toilet Transfer: Min guard;+2 for safety/equipment;Ambulation;RW;BSC Toilet Transfer Details (indicate cue type and reason): Demonstrated sit<>Stand transfer to [facility] using sink at UB support.  Pt did not report feeling fearful on bari-bsc           General ADL Comments: Pt demonstrated progress towards goals and performed groomign and UB bathing at sink. Hope to progress to [facility] in  shower for bathing.     Vision       Perception     Praxis      Cognition Arousal/Alertness: Awake/alert Behavior During Therapy: WFL for tasks assessed/performed;Anxious Overall Cognitive Status: Within Functional Limits for tasks assessed                                          Exercises     Shoulder Instructions       General Comments Pt did not demonstrate anxiety untill performing bed mobility from EOB to supine. Pt states "and this is when I feel like I am going to fall on the floor."    Pertinent Vitals/ Pain       Pain Assessment: Faces Faces Pain Scale: Hurts a little bit Pain Location: L breast and wound area at back Pain Descriptors / Indicators: Grimacing Pain Intervention(s): Monitored during session  Home Living                                          Prior Functioning/Environment              Frequency  Min 2X/week        Progress Toward Goals  OT Goals(current goals can now be found in the care plan section)  Progress towards OT goals: Progressing toward goals  Acute Rehab OT Goals Patient Stated Goal: To go home and be normal again OT Goal Formulation: With patient Time For Goal Achievement: 03/13/17 Potential to Achieve Goals: Good ADL Goals Pt Will Perform Grooming: with set-up;with supervision;standing Pt Will Perform Upper Body Bathing: with min assist;sitting Pt Will Perform Lower Body Bathing: with mod assist;sit to/from stand Pt Will Perform Upper Body Dressing: with min assist;sitting Pt Will Transfer to Toilet: with min assist;bedside commode;ambulating  Plan Discharge plan remains appropriate    Co-evaluation                 End of Session Equipment Utilized During Treatment: Rolling walker  OT Visit Diagnosis: Unsteadiness on feet (R26.81);Muscle weakness (generalized) (M62.81);Pain;Other abnormalities of gait and mobility (R26.89) Pain - Right/Left:  (Wound area) Pain -  part of body:  (Back)   Activity Tolerance Patient tolerated treatment well   Patient Left in bed;with call bell/phone within reach   Nurse Communication Mobility status        Time: 9024-0973 OT Time Calculation (min): 34 min  Charges: OT General Charges $OT Visit: 1 Procedure OT Treatments $Self Care/Home Management : 23-37 mins  [place], OTR/L [place] 03/07/2017, 12:42 PM

## 2017-03-08 LAB — BASIC METABOLIC PANEL
Anion gap: 8 (ref 5–15)
BUN: 10 mg/dL (ref 4–21)
BUN: 10 mg/dL (ref 6–20)
CHLORIDE: 98 mmol/L — AB (ref 101–111)
CO2: 27 mmol/L (ref 22–32)
CREATININE: 0.88 mg/dL (ref 0.44–1.00)
Calcium: 9 mg/dL (ref 8.9–10.3)
Creatinine: 0.9 mg/dL (ref 0.5–1.1)
GFR calc Af Amer: >60 mL/min (ref >60)
GFR calc non Af Amer: >60 mL/min (ref >60)
GLUCOSE: 104 mg/dL
GLUCOSE: 104 mg/dL — AB (ref 65–99)
Potassium: 3.4 mmol/L (ref 3.4–5.3)
Potassium: 3.4 mmol/L — ABNORMAL LOW (ref 3.5–5.1)
Sodium: 133 mmol/L — AB (ref 137–147)
Sodium: 133 mmol/L — ABNORMAL LOW (ref 135–145)

## 2017-03-08 LAB — C DIFFICILE QUICK SCREEN W PCR REFLEX
C Diff antigen: NEGATIVE
C Diff interpretation: NOT DETECTED
C Diff toxin: NEGATIVE

## 2017-03-08 MED ORDER — GUAIFENESIN-DM 100-10 MG/5ML PO SYRP: 5.0000 mL | ORAL_SOLUTION | ORAL | Status: DC | PRN

## 2017-03-08 MED ORDER — LOPERAMIDE HCL 2 MG PO CAPS
2.0000 mg | ORAL_CAPSULE | Freq: Two times a day (BID) | ORAL | Status: DC
  Administered 2017-03-08: 2 mg via ORAL
  Filled 2017-03-08: qty 1

## 2017-03-08 MED ORDER — GUAIFENESIN ER 600 MG PO TB12
600.0000 mg | ORAL_TABLET | Freq: Two times a day (BID) | ORAL | Status: DC
  Administered 2017-03-08 – 2017-03-09 (×3): 600 mg via ORAL
  Filled 2017-03-08 (×3): qty 1

## 2017-03-08 MED ORDER — POTASSIUM CHLORIDE CRYS ER 20 MEQ PO TBCR
20.0000 meq | EXTENDED_RELEASE_TABLET | Freq: Once | ORAL | Status: AC
  Administered 2017-03-08: 20 meq via ORAL
  Filled 2017-03-08: qty 1

## 2017-03-08 NOTE — Clinical Social Work Note (Addendum)
Patient received PASRR number - MUST ID #[id number]; PASRR number [id number] E, effective 4/22 - 04/03/17.  Tourist information centre manager continuing to work more placement for Difficult to Place patient. Patient will be discharged once appropriate SNF placement arranged.  Megan Ortiz, MSW, LCSW Licensed Clinical Social Worker [place] [phone]

## 2017-03-08 NOTE — Evaluation (Signed)
Physical Therapy Evaluation Patient Details Name: Megan Ortiz MRN: [id number] DOB: Apr 16, 1976 Today's Date: 03/08/2017   History of Present Illness  41 year old female with PMH of anemia, anxiety/depression, asthma, gastric ulcer, HTN, PE, and morbid obesity. Presents to [facility] ED on 4/5 with hypotension, tachycardia, and temp of 101.5. States that she has had a few days of diarrhea and decreased oral intake. Upon arrival to ED it was revealed that patient had an enlarging necrotizing wound of the buttocks (Patient states she first noted an "blister" about two days ago).  Transfered from [facility] to [facility] for further management.  Post I and D of gluteal wound.  Intubated post op, Extubated 4/11.  Clinical Impression  Today's therapy session focused on observation and education of caregiver on bed mobility with pt. Pt refused any OOB mobility this session due to fatigue from being moved multiple times throughout the day. Pt was able to sit EOB with support of her spouse who is her PCG. Pt and spouse have managed pt at home multiple times after hospitalizations and husband has set up the home with chairs in proper placement throughout home. Pt and spouse will benefit from additional instruction from therapy on safe transfers, bed mobs, and short distance gait as well as ambulation to bathroom in order to maximize her outcomes if she does return home. Pt also reports that she was vegan prior to hospital admission and has been eating a different diet since being admitted. RN notified and to update MD.    Follow Up Recommendations Home health PT;Supervision/Assistance - 24 hour    Equipment Recommendations  Rolling walker with 5" wheels;3in1 (PT);Other (comment) (bariatic equipment, baratric 3n1 with drop arm. )    Recommendations for Other Services       Precautions / Restrictions Precautions Precautions: Fall Precaution Comments: flexiseal and foley, buttocks wound Restrictions Weight Bearing  Restrictions: No      Mobility  Bed Mobility Overal bed mobility: Needs Assistance Bed Mobility: Rolling;Sidelying to Sit Rolling: Min assist Sidelying to sit: Min assist Supine to sit: Supervision     General bed mobility comments: heavy reliance on rail with caregiver assist to pull pt up, increased time. Pt struggling with the surface of the bariatric bed.  Transfers                    Ambulation/Gait                Stairs            Wheelchair Mobility    Modified Rankin (Stroke Patients Only)       Balance Overall balance assessment: Needs assistance   Sitting balance-Leahy Scale: Fair Sitting balance - Comments: No LOB in static sitting at EOB.                                     Pertinent Vitals/Pain Pain Assessment: No/denies pain    Home Living                        Prior Function                 Hand Dominance        Extremity/Trunk Assessment                Communication      Cognition Arousal/Alertness: Awake/alert Behavior During Therapy: [facility]  for tasks assessed/performed;Anxious Overall Cognitive Status: Within Functional Limits for tasks assessed                                        General Comments General comments (skin integrity, edema, etc.): Husband present throughout session and assisted with bed mobs as he does at home.     Exercises     Assessment/Plan    PT Assessment    PT Problem List         PT Treatment Interventions      PT Goals (Current goals can be found in the Care Plan section)  Acute Rehab PT Goals Patient Stated Goal: To go home and be normal again    Frequency Min 3X/week   Barriers to discharge        Co-evaluation PT/OT/SLP Co-Evaluation/Treatment: Yes Reason for Co-Treatment: For patient/therapist safety PT goals addressed during session: Mobility/safety with mobility OT goals addressed during session: ADL's and  self-care       End of Session   Activity Tolerance: Patient tolerated treatment well;Patient limited by fatigue Patient left: in bed;with call bell/phone within reach;with family/visitor present Nurse Communication: Mobility status PT Visit Diagnosis: Other abnormalities of gait and mobility (R26.89);Muscle weakness (generalized) (M62.81);Difficulty in walking, not elsewhere classified (R26.2)    Time: 7505-1833 PT Time Calculation (min) (ACUTE ONLY): 29 min   Charges:     PT Treatments $Therapeutic Activity: 8-22 mins   PT G Codes:        Scheryl Marten PT, DPT  [phone]   Jacqulyn Liner Sloan Leiter 03/08/2017, 4:44 PM

## 2017-03-08 NOTE — Progress Notes (Signed)
Occupational Therapy Treatment Patient Details Name: Megan Ortiz MRN: [id number] DOB: May 28, 1976 Today's Date: 03/08/2017    History of present illness 41 year old female with PMH of anemia, anxiety/depression, asthma, gastric ulcer, HTN, PE, and morbid obesity. Presents to [facility] ED on 4/5 with hypotension, tachycardia, and temp of 101.5. States that she has had a few days of diarrhea and decreased oral intake. Upon arrival to ED it was revealed that patient had an enlarging necrotizing wound of the buttocks (Patient states she first noted an "blister" about two days ago).  Transfered from [facility] to [facility] for further management.  Post I and D of gluteal wound.  Intubated post op, Extubated 4/11.   OT comments  Pt seen with her caregiver participating in session. Pt willing to demonstrate her ability to perform bed mobility and sit EOB, declined ambulation. Pt and caregiver have managed at home following multiple hospitalizations and are confident about returning home. Educated in use of shower seat for safety and energy conservation. Pt also agreeable to RW for home. Caregiver's only concern is managing pt's wound and requests more education. Pt informing therapy staff that she has been a vegen for 5 years and has not been maintaining this diet since admission which she thinks may be contributing to loose stools. RN notified. Encouraged pt to participate in ADL sitting and standing at EOB instead of at bed level with nursing staff so she may prepare for going home.  Follow Up Recommendations  No OT follow up;Supervision/Assistance - 24 hour    Equipment Recommendations  Tub/shower bench (or drop arm bariatric commode)    Recommendations for Other Services      Precautions / Restrictions Precautions Precautions: Fall Precaution Comments: flexiseal and foley, buttocks wound       Mobility Bed Mobility Overal bed mobility: Needs Assistance Bed Mobility: Rolling;Sidelying to Sit Rolling:  Min assist Sidelying to sit: Min assist Supine to sit: Supervision     General bed mobility comments: heavy reliance on rail with caregiver assist to pull pt up, increased time. Pt struggling with the surface of the bariatric bed.  Transfers                      Balance Overall balance assessment: Needs assistance   Sitting balance-Leahy Scale: Fair Sitting balance - Comments: No LOB in static sitting at EOB.                                   ADL either performed or assessed with clinical judgement   ADL                                         General ADL Comments: Educated pt on importance of participating in bathing and dressing at EOB and in standing with nursing staff. Instructed in benefits of seated showering for safety and energy conservation. Pt is declining a BSC, but does want a RW and seat for showering. Pt's caregiver observed and participated in session.      Vision       Perception     Praxis      Cognition Arousal/Alertness: Awake/alert Behavior During Therapy: WFL for tasks assessed/performed;Anxious Overall Cognitive Status: Within Functional Limits for tasks assessed  Exercises     Shoulder Instructions       General Comments      Pertinent Vitals/ Pain       Pain Assessment: No/denies pain  Home Living                                          Prior Functioning/Environment              Frequency  Min 2X/week        Progress Toward Goals  OT Goals(current goals can now be found in the care plan section)  Progress towards OT goals: Progressing toward goals  Acute Rehab OT Goals Patient Stated Goal: To go home and be normal again Time For Goal Achievement: 03/13/17 Potential to Achieve Goals: Good  Plan Discharge plan needs to be updated    Co-evaluation    PT/OT/SLP Co-Evaluation/Treatment: Yes Reason for  Co-Treatment: For patient/therapist safety   OT goals addressed during session: ADL's and self-care      End of Session    OT Visit Diagnosis: Unsteadiness on feet (R26.81);Muscle weakness (generalized) (M62.81);Pain;Other abnormalities of gait and mobility (R26.89)   Activity Tolerance Patient tolerated treatment well   Patient Left in bed;with call bell/phone within reach;with family/visitor present   Nurse Communication  (pt is a vegan)        Time: 1504-1364 OT Time Calculation (min): 29 min  Charges: OT General Charges $OT Visit: 1 Procedure OT Treatments $Self Care/Home Management : 8-22 mins     Malka So 03/08/2017, 4:22 PM  [phone]

## 2017-03-08 NOTE — Progress Notes (Signed)
PT Cancellation Note  Patient Details Name: Megan Ortiz MRN: [id number] DOB: 1976-04-05   Cancelled Treatment:    Reason Eval/Treat Not Completed: Patient declined, no reason specified. Pt reports that she feels "nasty" and wants to have her dressing changed before therapy. Reports her husband will be in later and requests PT come back when her husband is available. Will check back later as time allows.    Scheryl Marten PT, DPT  [phone]  03/08/2017, 9:38 AM

## 2017-03-08 NOTE — Progress Notes (Signed)
[facility]                                                                              Patient Demographics  Megan Ortiz, is a 41 y.o. female, DOB - Dec 09, 1975, ZJQ:[id number]  Admit date - 02/15/2017   Admitting Physician Rigoberto Noel, MD  Outpatient Primary MD for the patient is No PCP Per Patient  Outpatient specialists:   LOS - 20  days    Chief Complaint  Patient presents with  . Skin Ulcer       Brief summary   Admitted by critical care 02/16/2017 -necrotizing wound of left buttock, admit with MODS [hypotension, tachycardia, MAXIMUM TEMPERATURE 101, needing sepsis protocol, started on Levaquin 5] History of present illness 13/0.6--31/2.4 and admission Lactic acidosis 4.6, white count 42 hemoglobin 8.5 Transferred for Gen. surgery for debridement-wound grew strep viridans mixed anaerobic flora Eventually diuresed after initial volume resuscitation   Assessment & Plan    Principal problem acute respiratory failure secondary to sepsis with underlying diagnosis of asthma and prior tobacco. - Patient was extubated 4/11 but required 4 L during initial recovery and weaned off. Currently O2 sats 96% on room air - Patient on nebulizers and Pulmicort and Atrovent with clinical improvement, follow closely.   Active problems  necrotizing fasciitis with strep very dense/buttocks abscess - Seen by general surgery and plastic surgery during hospitalization  -Underwent debridement of skin, subcutaneous tissue, muscle left gluteal and pararectal space and cultures obtained.  -  underwent dressing change of left gluteal wound under anesthesia per Dr. Donne Hazel on 02/19/2017.  - was on empiric IV antibiotics IV Zosyn and IV clindamycin and subsequently transitioned to IV Unasyn and then to oral Augmentin. Patient finished course of antibiotics. Patient currently afebrile. Patient undergoing wound dressing changes. - Outpatient follow-up with plastic surgery    acute kidney injury - Secondary to sepsis physiology and in the setting of ACE inhibitor/Aldactone.  -Resolved, creatinine function has normalized  hypokalemia/hypomagnesemia Replaced   CHF vs OHSS versus pulmonary hypertension Patient was placed on Lasix IV with good diuresis. IV Lasix changed to oral Lasix. Clinical improvement.  anemia Status post transfusion. H&H stable.   iatrogenic diarrhea Patient noted to have stools loose. Discontinued stool softener. Flexi seal placed to decrease wound contamination. -Will give another dose of Imodium. Continue Questran.    habitus of OHSS  Code Status: Full CODE STATUS DVT Prophylaxis:  Lovenox Family Communication: Discussed in detail with the patient, all imaging results, lab results explained to the patient   Disposition Plan: Awaiting skilled nursing facility  Time Spent in minutes   25 minutes  Procedures:  Debridement of the subcutaneous tissue, muscle left gluteal and pararectal space 02/16/17 Dressing change left gluteal wound, exam under anesthesia Dr. Donne Hazel 4/9  Consultants:   General surgery Marcello Moores surgery Plastic surgery  Antimicrobials:      Medications  Scheduled Meds: . acetaminophen  1,000 mg Oral Q8H  . budesonide (PULMICORT) nebulizer solution  0.5 mg Nebulization BID  . cholestyramine light  4 g Oral QID  . enoxaparin (LOVENOX) injection  100 mg Subcutaneous Q24H  . feeding supplement (ENSURE  ENLIVE)  237 mL Oral TID BM  . furosemide  40 mg Oral BID  . guaiFENesin  600 mg Oral BID  . loperamide  2 mg Oral BID  . pantoprazole  40 mg Oral QHS  . potassium chloride  40 mEq Oral TID  . sertraline  100 mg Oral BID   Continuous Infusions: PRN Meds:.alum & mag hydroxide-simeth, fentaNYL (SUBLIMAZE) injection, guaiFENesin-dextromethorphan, ipratropium-albuterol, ondansetron (ZOFRAN) IV, ondansetron, oxyCODONE, phenol, polyethylene glycol, sodium chloride, sodium chloride flush   Antibiotics    Anti-infectives    Start     Dose/Rate Route Frequency Ordered Stop   02/25/17 1300  amoxicillin-clavulanate (AUGMENTIN) 875-125 MG per tablet 1 tablet     1 tablet Oral Every 12 hours 02/25/17 1208 03/01/17 2126   02/23/17 0800  Ampicillin-Sulbactam (UNASYN) 3 g in sodium chloride 0.9 % 100 mL IVPB  Status:  Discontinued     3 g 200 mL/hr over 30 Minutes Intravenous Every 8 hours 02/23/17 0504 02/25/17 1208   02/22/17 1200  Ampicillin-Sulbactam (UNASYN) 3 g in sodium chloride 0.9 % 100 mL IVPB  Status:  Discontinued     3 g 200 mL/hr over 30 Minutes Intravenous Every 8 hours 02/22/17 1149 02/23/17 0504   02/16/17 2200  clindamycin (CLEOCIN) IVPB 900 mg  Status:  Discontinued     900 mg 100 mL/hr over 30 Minutes Intravenous Every 8 hours 02/16/17 1418 02/20/17 0959   02/16/17 0600  clindamycin (CLEOCIN) IVPB 600 mg  Status:  Discontinued     600 mg 100 mL/hr over 30 Minutes Intravenous Every 8 hours 02/16/17 0352 02/16/17 1418   02/16/17 0400  piperacillin-tazobactam (ZOSYN) IVPB 3.375 g  Status:  Discontinued     3.375 g 12.5 mL/hr over 240 Minutes Intravenous Every 8 hours 02/16/17 0400 02/22/17 1148   02/16/17 0400  vancomycin (VANCOCIN) 2,000 mg in sodium chloride 0.9 % 500 mL IVPB  Status:  Discontinued     2,000 mg 250 mL/hr over 120 Minutes Intravenous Every 24 hours 02/16/17 0400 02/19/17 0917   02/15/17 2145  clindamycin (CLEOCIN) IVPB 600 mg     600 mg 100 mL/hr over 30 Minutes Intravenous  Once 02/15/17 2140 02/15/17 2347   02/15/17 2000  piperacillin-tazobactam (ZOSYN) IVPB 3.375 g     3.375 g 100 mL/hr over 30 Minutes Intravenous  Once 02/15/17 1955 02/15/17 2131   02/15/17 2000  vancomycin (VANCOCIN) IVPB 1000 mg/200 mL premix     1,000 mg 200 mL/hr over 60 Minutes Intravenous  Once 02/15/17 1955 02/15/17 2348        Subjective:   Megan Ortiz was seen and examined today. Patient denies dizziness, chest pain, shortness of breath, abdominal pain, N/V/D/C,  new weakness, numbess, tingling. Still has diarrhea, flex seal eaking  Objective:   Vitals:   03/07/17 1644 03/07/17 2157 03/08/17 0603 03/08/17 0900  BP: 136/72 (!) 131/56 (!) 163/71 (!) 155/65  Pulse: 77 (!) 106 (!) 106 (!) 101  Resp: 20 20 20 20   Temp: 98.7 F (37.1 C) 98 F (36.7 C) 97.7 F (36.5 C) 98.4 F (36.9 C)  TempSrc: Oral Oral Oral Oral  SpO2: 96% 92% 93% 96%  Weight:      Height:        Intake/Output Summary (Last 24 hours) at 03/08/17 1528 Last data filed at 03/08/17 1000  Gross per 24 hour  Intake              840 ml  Output  1325 ml  Net             -485 ml     Wt Readings from Last 3 Encounters:  03/05/17 (!) 192.3 kg (424 lb)  05/11/13 (!) 204.1 kg (450 lb)     Exam  General: Alert and oriented x 3, NAD  HEENT:  PERRLA, EOMI, Anicteric Sclera, mucous membranes moist.   Neck: Supple, no JVD, no masses  Cardiovascular: S1 S2 auscultated, no rubs, murmurs or gallops. Regular rate and rhythm.  Respiratory: Clear to auscultation bilaterally, no wheezing, rales or rhonchi  Gastrointestinal: Morbidly obese Soft, nontender, nondistended, + bowel sounds  Ext: no cyanosis clubbing or edema  Neuro: AAOx3, Cr N's II- XII. Strength 5/5 upper and lower extremities bilaterally  Skin: Flexi seal in place, wound dressings  Psych: Normal affect and demeanor, alert and oriented x3    Data Reviewed:  I have personally reviewed following labs and imaging studies  Micro Results No results found for this or any previous visit (from the past 240 hour(s)).  Radiology Reports Dg Chest Port 1 View  Result Date: 02/21/2017 CLINICAL DATA:  41 year old female status post surgical debridement of necrotizing soft tissue infection of the left gluteus. Sepsis. Remains intubated, attempted weaning yesterday. EXAM: PORTABLE CHEST 1 VIEW COMPARISON:  02/20/2017 and earlier. FINDINGS: Portable AP semi upright view at 0836 hours. Endotracheal tube tip projects  between the clavicles and carina. Stable right IJ central line and visible enteric tube which courses to the abdomen. The patient is more rotated to the right. Patchy and confluent bibasilar opacity greater on the right, probably not significantly changed since yesterday allowing for rotation. No pneumothorax. No pulmonary edema. Possible small right pleural effusion. Stable cardiac size and mediastinal contours. IMPRESSION: 1.  Stable lines and tubes. 2. Stable patchy lung base opacity suspicious for pneumonia. Possible small right pleural effusion. Electronically Signed   By: Genevie Ann M.D.   On: 02/21/2017 08:52   Dg Chest Port 1 View  Result Date: 02/20/2017 CLINICAL DATA:  Respiratory failure.  ETT placement. EXAM: PORTABLE CHEST 1 VIEW COMPARISON:  02/18/2017.  Global FINDINGS: Stable cardiomegaly. Stable cardiomediastinal silhouette. Unchanged tubes and lines. BILATERAL pulmonary opacities, RIGHT greater than LEFT without significant improvement. IMPRESSION: Stable chest. Electronically Signed   By: Staci Righter M.D.   On: 02/20/2017 07:07   Dg Chest Port 1 View  Result Date: 02/18/2017 CLINICAL DATA:  Respiratory failure  ET tube EXAM: PORTABLE CHEST 1 VIEW COMPARISON:  Chest x-rays dated 02/17/2017 and 02/16/2017. FINDINGS: Endotracheal tube is well positioned with tip just above the level of the carina. Enteric tube passes below the diaphragm. Right IJ central line is stable in position with tip at the level of the upper SVC. Stable cardiomegaly. Overall cardiomediastinal silhouette is stable. Patchy bilateral airspace opacities are stable compared to yesterday's exam, slightly increased compared to the earlier exam of 02/16/2017. No pneumothorax seen. IMPRESSION: 1. Patchy bilateral airspace opacities are stable compared to yesterday's exam, but increased compared to the earlier exam of 02/16/2017, consistent with multifocal pneumonia versus asymmetric pulmonary edema. 2. Support apparatus, as  detailed above. Endotracheal tube may have been advanced slightly since yesterday's study, now well positioned just above the level of the carina. 3. Stable cardiomegaly. Electronically Signed   By: Franki Cabot M.D.   On: 02/18/2017 08:17   Dg Chest Port 1 View  Result Date: 02/17/2017 CLINICAL DATA:  Intubation. EXAM: PORTABLE CHEST 1 VIEW COMPARISON:  February 16, 2017 FINDINGS: The  ETT has been pulled back somewhat in the interval and terminates 2.8 cm below the thoracic inlet in adequate position. No pneumothorax. A right central line terminates in the central SVC. Increasing bilateral pulmonary opacities, right greater than left suggesting multifocal infiltrate/pneumonia. Cardiomegaly persists. IMPRESSION: 1. The ET tube has been pulled back but still terminates nearly 3 cm below the thoracic inlet in adequate position. Other support apparatus is stable. 2. Increasing bilateral pulmonary opacities. The appearance is concerning for multifocal pneumonia. Recommend clinical correlation and attention on follow-up. Electronically Signed   By: Dorise Bullion III M.D   On: 02/17/2017 07:44   Dg Chest Port 1 View  Result Date: 02/16/2017 CLINICAL DATA:  Postoperative debridement of gluteal necrotic lesion. Hypoxia. EXAM: PORTABLE CHEST 1 VIEW COMPARISON:  February 15, 2017 FINDINGS: Endotracheal tube tip is 4.7 cm above the carina. Central catheter tip is in the superior vena cava. Nasogastric tube tip is at the gastroesophageal junction. The tube deviates toward the right in the lower chest, likely due to a hiatal hernia. There is no appreciable edema or consolidation. There is right midlung atelectatic change. There is cardiomegaly with pulmonary venous hypertension. No evident adenopathy. No bone lesions. IMPRESSION: Tube and catheter positions as described without pneumothorax. There is pulmonary vascular congestion. There is right midlung atelectasis. No frank edema or consolidation. Electronically Signed   By:  Lowella Grip III M.D.   On: 02/16/2017 11:08   Dg Chest Port 1 View  Result Date: 02/15/2017 CLINICAL DATA:  Possible sepsis. History of asthma, hypertension and pulmonary embolism. History tachycardia. EXAM: PORTABLE CHEST 1 VIEW COMPARISON:  None. FINDINGS: Study limited by the patient's positioning, AP technique and the patient's body habitus. Cardiac silhouette is mildly enlarged. There is opacity at the left lung base. Mild hazy right mid to lower lung zone opacity is felt be due to the significant overlying soft tissues. No convincing pulmonary edema. No obvious pneumothorax.  No mediastinal or hilar masses. IMPRESSION: 1. Significantly limited study. 2. Mild enlargement of the cardiopericardial silhouette. 3. Possible pneumonia versus atelectasis at the left lung base. Electronically Signed   By: Lajean Manes M.D.   On: 02/15/2017 21:03   Dg Abd Portable 1v  Result Date: 02/16/2017 CLINICAL DATA:  NG tube placement EXAM: PORTABLE ABDOMEN - 1 VIEW COMPARISON:  None. FINDINGS: Nasogastric tube with the tip projecting over the body of the stomach. There is no bowel dilatation to suggest obstruction. There is no evidence of pneumoperitoneum, portal venous gas or pneumatosis. There are no pathologic calcifications along the expected course of the ureters. The osseous structures are unremarkable. IMPRESSION: Nasogastric tube with the tip projecting over the body of the stomach. Electronically Signed   By: Kathreen Devoid   On: 02/16/2017 11:54    Lab Data:  CBC:  Recent Labs Lab 03/04/17 0723 03/06/17 0330  WBC 10.0 7.8  NEUTROABS  --  5.4  HGB 8.1* 7.9*  HCT 27.9* 27.2*  MCV 90.6 89.8  PLT 565* 544*   Basic Metabolic Panel:  Recent Labs Lab 03/02/17 0340 03/04/17 0723 03/05/17 0619 03/06/17 0330 03/08/17 0519  NA  --  132* 136 135 133*  K  --  3.9 3.6 3.7 3.4*  CL  --  100* 101 98* 98*  CO2  --  23 27 28 27   GLUCOSE  --  117* 105* 106* 104*  BUN  --  10 10 12 10     CREATININE 1.23* 0.89 0.87 0.82 0.88  CALCIUM  --  8.6* 8.9 8.9 9.0  MG  --  2.3  --   --   --    GFR: Estimated Creatinine Clearance: 145.7 mL/min (by C-G formula based on SCr of 0.88 mg/dL). Liver Function Tests: No results for input(s): AST, ALT, ALKPHOS, BILITOT, PROT, ALBUMIN in the last 168 hours. No results for input(s): LIPASE, AMYLASE in the last 168 hours. No results for input(s): AMMONIA in the last 168 hours. Coagulation Profile: No results for input(s): INR, PROTIME in the last 168 hours. Cardiac Enzymes: No results for input(s): CKTOTAL, CKMB, CKMBINDEX, TROPONINI in the last 168 hours. BNP (last 3 results) No results for input(s): PROBNP in the last 8760 hours. HbA1C: No results for input(s): HGBA1C in the last 72 hours. CBG: No results for input(s): GLUCAP in the last 168 hours. Lipid Profile: No results for input(s): CHOL, HDL, LDLCALC, TRIG, CHOLHDL, LDLDIRECT in the last 72 hours. Thyroid Function Tests: No results for input(s): TSH, T4TOTAL, FREET4, T3FREE, THYROIDAB in the last 72 hours. Anemia Panel: No results for input(s): VITAMINB12, FOLATE, FERRITIN, TIBC, IRON, RETICCTPCT in the last 72 hours. Urine analysis:    Component Value Date/Time   COLORURINE AMBER (A) 02/16/2017 0100   APPEARANCEUR HAZY (A) 02/16/2017 0100   LABSPEC 1.026 02/16/2017 0100   PHURINE 5.0 02/16/2017 0100   GLUCOSEU NEGATIVE 02/16/2017 0100   HGBUR NEGATIVE 02/16/2017 0100   BILIRUBINUR NEGATIVE 02/16/2017 0100   KETONESUR NEGATIVE 02/16/2017 0100   PROTEINUR NEGATIVE 02/16/2017 0100   UROBILINOGEN 0.2 05/11/2013 1903   NITRITE NEGATIVE 02/16/2017 0100   LEUKOCYTESUR NEGATIVE 02/16/2017 0100     Dequante Tremaine M.D. [facility] 03/08/2017, 3:28 PM  Pager: [phone] Between 7am to 7pm - call Pager - [phone]  After 7pm go to [email or web address] - password [email or web address]  Call night coverage person covering after 7pm

## 2017-03-09 ENCOUNTER — Emergency Department (HOSPITAL_COMMUNITY)
Admission: EM | Admit: 2017-03-09 | Discharge: 2017-03-10 | Disposition: A | Discharge status: Home / Self Care | Payer: Self-pay | Attending: Emergency Medicine | Admitting: Emergency Medicine

## 2017-03-09 ENCOUNTER — Encounter (HOSPITAL_COMMUNITY): Payer: Self-pay | Admitting: *Deleted

## 2017-03-09 DIAGNOSIS — I10 Essential (primary) hypertension: Secondary | ICD-10-CM | POA: Insufficient documentation

## 2017-03-09 DIAGNOSIS — L8990 Pressure ulcer of unspecified site, unspecified stage: Secondary | ICD-10-CM

## 2017-03-09 DIAGNOSIS — L89100 Pressure ulcer of unspecified part of back, unstageable: Secondary | ICD-10-CM | POA: Insufficient documentation

## 2017-03-09 DIAGNOSIS — Z87891 Personal history of nicotine dependence: Secondary | ICD-10-CM | POA: Insufficient documentation

## 2017-03-09 LAB — CBC
HCT: 25.3 % — ABNORMAL LOW (ref 36.0–46.0)
Hemoglobin: 7.7 g/dL — ABNORMAL LOW (ref 12.0–15.0)
MCH: 27.1 pg (ref 26.0–34.0)
MCHC: 30.4 g/dL (ref 30.0–36.0)
MCV: 89.1 fL (ref 78.0–100.0)
Platelets: 419 10*3/uL — ABNORMAL HIGH (ref 150–400)
RBC: 2.84 MIL/uL — ABNORMAL LOW (ref 3.87–5.11)
RDW: 18.1 % — ABNORMAL HIGH (ref 11.5–15.5)
WBC: 9.6 10*3/uL (ref 4.0–10.5)

## 2017-03-09 LAB — BASIC METABOLIC PANEL
Anion gap: 9 (ref 5–15)
BUN: 10 mg/dL (ref 4–21)
BUN: 10 mg/dL (ref 6–20)
CHLORIDE: 99 mmol/L — AB (ref 101–111)
CO2: 24 mmol/L (ref 22–32)
CREATININE: 0.73 mg/dL (ref 0.44–1.00)
CREATININE: 0.7 mg/dL (ref 0.5–1.1)
Calcium: 8.9 mg/dL (ref 8.9–10.3)
GFR calc Af Amer: >60 mL/min (ref >60)
GLUCOSE: 100 mg/dL — AB (ref 65–99)
Glucose: 100 mg/dL
Potassium: 4.5 mmol/L (ref 3.5–5.1)
SODIUM: 132 mmol/L — AB (ref 135–145)
SODIUM: 132 mmol/L — AB (ref 137–147)

## 2017-03-09 LAB — CBC AND DIFFERENTIAL: WBC: 9.6 10^3/mL

## 2017-03-09 MED ORDER — SERTRALINE HCL 100 MG PO TABS
100.0000 mg | ORAL_TABLET | Freq: Two times a day (BID) | ORAL | Status: DC
  Administered 2017-03-10 (×2): 100 mg via ORAL
  Filled 2017-03-09 (×3): qty 1

## 2017-03-09 MED ORDER — FUROSEMIDE 20 MG PO TABS
40.0000 mg | ORAL_TABLET | Freq: Two times a day (BID) | ORAL | Status: DC
  Administered 2017-03-10 (×2): 40 mg via ORAL
  Filled 2017-03-09 (×2): qty 2

## 2017-03-09 MED ORDER — OXYCODONE HCL 5 MG PO TABS: 5.0000 mg | ORAL_TABLET | Freq: Four times a day (QID) | ORAL | 0 refills | Status: DC | PRN

## 2017-03-09 MED ORDER — LOPERAMIDE HCL 2 MG PO CAPS: 4.0000 mg | ORAL_CAPSULE | Freq: Two times a day (BID) | ORAL | 0 refills | Status: DC

## 2017-03-09 MED ORDER — FUROSEMIDE 40 MG PO TABS: 40.0000 mg | ORAL_TABLET | Freq: Two times a day (BID) | ORAL | 2 refills | Status: DC

## 2017-03-09 MED ORDER — PANTOPRAZOLE SODIUM 40 MG PO TBEC
40.0000 mg | DELAYED_RELEASE_TABLET | Freq: Every day | ORAL | Status: DC
  Administered 2017-03-10: 40 mg via ORAL
  Filled 2017-03-09: qty 1

## 2017-03-09 MED ORDER — ALBUTEROL SULFATE HFA 108 (90 BASE) MCG/ACT IN AERS: 2.0000 | INHALATION_SPRAY | Freq: Four times a day (QID) | RESPIRATORY_TRACT | Status: DC | PRN

## 2017-03-09 MED ORDER — ACETAMINOPHEN 500 MG PO TABS: 500.0000 mg | ORAL_TABLET | Freq: Four times a day (QID) | ORAL | Status: DC | PRN

## 2017-03-09 MED ORDER — CHOLESTYRAMINE LIGHT 4 G PO PACK: 4.0000 g | PACK | Freq: Four times a day (QID) | ORAL | 1 refills | Status: DC

## 2017-03-09 MED ORDER — OXYCODONE HCL 5 MG PO TABS
5.0000 mg | ORAL_TABLET | Freq: Four times a day (QID) | ORAL | Status: DC | PRN
  Administered 2017-03-10 (×2): 10 mg via ORAL
  Filled 2017-03-09 (×2): qty 2

## 2017-03-09 MED ORDER — LOPERAMIDE HCL 2 MG PO CAPS
4.0000 mg | ORAL_CAPSULE | Freq: Two times a day (BID) | ORAL | Status: DC
  Administered 2017-03-09: 4 mg via ORAL
  Filled 2017-03-09: qty 2

## 2017-03-09 MED ORDER — ONDANSETRON HCL 4 MG PO TABS: 4.0000 mg | ORAL_TABLET | Freq: Three times a day (TID) | ORAL | 0 refills | Status: AC | PRN

## 2017-03-09 MED ORDER — GUAIFENESIN ER 600 MG PO TB12
600.0000 mg | ORAL_TABLET | Freq: Two times a day (BID) | ORAL | 0 refills | Status: DC
Start: 2017-03-09 — End: 2019-05-24

## 2017-03-09 MED ORDER — ONDANSETRON HCL 4 MG PO TABS
4.0000 mg | ORAL_TABLET | Freq: Three times a day (TID) | ORAL | Status: DC | PRN
  Administered 2017-03-10: 4 mg via ORAL
  Filled 2017-03-09: qty 1

## 2017-03-09 MED ORDER — MONTELUKAST SODIUM 10 MG PO TABS
10.0000 mg | ORAL_TABLET | Freq: Every day | ORAL | Status: DC
  Filled 2017-03-09: qty 1

## 2017-03-09 MED ORDER — OXYCODONE-ACETAMINOPHEN 5-325 MG PO TABS
2.0000 | ORAL_TABLET | Freq: Once | ORAL | Status: AC
  Administered 2017-03-09: 2 via ORAL
  Filled 2017-03-09: qty 2

## 2017-03-09 NOTE — ED Notes (Signed)
Pt asking for pain med 

## 2017-03-09 NOTE — Progress Notes (Signed)
Pt d/c today to [facility] after la engthy hospitalization.  Pt's bariatric equipment was delivered to the facility prior to her arrival, with the exception of an overlay mattress.  Pt concerned about being on a regular mattress due to her wound and requested transport back to the [facility] ED.  RNCM consulted.  Pt returning home with HHC vs returning to the NH (upon receipt of overlay) discussed.  It is not clear whether or not pt would be eligible for [facility] (pt uniisured) program at this time, but [facility] [facility] is willing to accept pt back at d/c. [facility] will order overlay for pt's bed at the [facility].  CSW will continue to follow for disposition.

## 2017-03-09 NOTE — Progress Notes (Signed)
Inpatient Rehabilitation  Received a re-consult for IP Rehab from MD.  Please refer to my note from 02/28/17 for background.  Since this time patient with inconsistent participation with therapies and OT is no longer recommending follow-up.  Also note that wound care and management continues to be most significant concern.  Given that patient has received a PASRR number SNF with a longer course for medical management with less intensive therapies is what we continue to recommend.  Will sign off at this time.   Carmelia Roller., CCC/SLP Admission Coordinator  [place]  Cell [phone]

## 2017-03-09 NOTE — Discharge Summary (Addendum)
Physician Discharge Summary   Patient ID: Megan Ortiz MRN: [id number] DOB/AGE: 1976/09/08 41 y.o.  Admit date: 02/15/2017 Discharge date: 03/09/2017  Primary Care Physician:  Larene Beach, MD  Discharge Diagnoses:    . Septic shock ([place]) acute respiratory failure  With hypoxia necrotizing fasciitis with strep /buttocks abscess acute kidney injury Hypokalemia Hypomagnesemia Anemia Iatrogenic diarrhea Obesity hypoventilation syndrome   Moderate to severe protein calorie nutrition, hypo-albuminemia  Consults:   Gen. Surgery Plastic surgery  Recommendations for Outpatient Follow-up:  1. Outpatient follow-up with plastic surgery 2. Please repeat CBC/BMET at next visit 3. Skin care to areas of intertriginous dermatitis in the bilateral inframammary and subpannicular (abdominal) skin folds:  Cleanse with house skin cleanser (Bedside Care) and gently, but thoroughly dry.  Apply house antimicrobial textile according to the instructions below: 4. Measure and cut length of InterDry Ag+ to fit in skin folds that have skin breakdown 5. Tuck InterDry  Ag+ fabric into skin folds in a single layer, allow for 2 inches of overhang from skin edges to allow for wicking to occur 6. May remove to bathe; dry area thoroughly and then tuck into affected areas again  7. Do not apply any creams or ointments when using InterDry Ag+ 8. DO NOT THROW AWAY FOR 5 DAYS unless soiled with stool 9. DO NOT [facility] product, this will inactivate the silver in the material  10. New sheet of Interdry Ag+ should be applied after 5 days of use if patient continues to have skin breakdown 11. Keep flexiseal in until diarrhea stops. Then discontinue cholestyramine and Imodium  12. Please make follow-up appointment with plastic surgery ASAP   DIET: Heart healthy diet,vegan diet     Allergies:   Allergies  Allergen Reactions  . Aspirin     Due to hx of stomach ulcers     DISCHARGE MEDICATIONS: Current  Discharge Medication List    START taking these medications   Details  cholestyramine light (PREVALITE) 4 g packet Take 1 packet (4 g total) by mouth 4 (four) times daily. Continue until diarrhea has stopped Qty: 120 packet, Refills: 1    furosemide (LASIX) 40 MG tablet Take 1 tablet (40 mg total) by mouth 2 (two) times daily. Qty: 60 tablet, Refills: 2    guaiFENesin (MUCINEX) 600 MG 12 hr tablet Take 1 tablet (600 mg total) by mouth 2 (two) times daily. Qty: 30 tablet, Refills: 0    loperamide (IMODIUM) 2 MG capsule Take 2 capsules (4 mg total) by mouth 2 (two) times daily. Continue until the diarrhea stops Qty: 30 capsule, Refills: 0    ondansetron (ZOFRAN) 4 MG tablet Take 1 tablet (4 mg total) by mouth every 8 (eight) hours as needed for nausea or vomiting. Qty: 20 tablet, Refills: 0    oxyCODONE (OXY IR/ROXICODONE) 5 MG immediate release tablet Take 1-2 tablets (5-10 mg total) by mouth every 6 (six) hours as needed for severe pain. Qty: 10 tablet, Refills: 0    pantoprazole (PROTONIX) 40 MG tablet Take 1 tablet (40 mg total) by mouth at bedtime. Qty: 30 tablet, Refills: 0      CONTINUE these medications which have NOT CHANGED   Details  acetaminophen (TYLENOL) 500 MG tablet Take 500 mg by mouth every 6 (six) hours as needed for moderate pain.    albuterol (VENTOLIN HFA) 108 (90 BASE) MCG/ACT inhaler Inhale 2 puffs into the lungs every 6 (six) hours as needed for wheezing.    montelukast (SINGULAIR) 10 MG tablet Take 10  mg by mouth daily.    Multiple Vitamin (MULTIVITAMIN WITH MINERALS) TABS Take 1 tablet by mouth daily.    sertraline (ZOLOFT) 100 MG tablet Take 1 tablet (100 mg total) by mouth 2 (two) times daily. Qty: 60 tablet, Refills: 2    topiramate (TOPAMAX) 50 MG tablet Take 50 mg by mouth daily. Refills: 11      STOP taking these medications     cyclobenzaprine (FLEXERIL) 10 MG tablet      diltiazem (CARDIZEM CD) 180 MG 24 hr capsule      lisinopril  (PRINIVIL,ZESTRIL) 40 MG tablet      nortriptyline (PAMELOR) 25 MG capsule      spironolactone (ALDACTONE) 50 MG tablet      sucralfate (CARAFATE) 1 g tablet          Brief H and P: For complete details please refer to admission H and P, but in brief Admitted by critical care service on 02/16/2017. Briefly 41 year old morbidly obese female admitted for generalized weakness and blister on her left buttock. To be hypotensive febrile with lactate of [place] [facility]. Examination showed enlarging necrotizing wound of left buttock. She was transferred to [facility] for surgical debridement. Patient was found to have  multiorgan dysfunction MODS/ [hypotension, tachycardia, MAXIMUM TEMPERATURE 101, needing sepsis protocol. She was placed on pressors, labs showed renal failure with creatinine of 2.4.  wound grew strep viridans mixed anaerobic flora Eventually diuresed after initial volume resuscitation. Patient was transferred to [facility] hospital[facility] service on 02/22/17   Hospital Course:  Acute respiratory failure secondary to sepsis with underlying diagnosis of asthma andprior tobacco. -Patient was extubated on 4/11 but required 4 L during initial recovery and weaned off. Currently O2 sats 97% on room air - Patient on nebulizers andPulmicort and Atrovent with clinical improvement    necrotizing fasciitis with strep very dense/buttocks abscess - Seen by general surgery and plastic surgery during hospitalization  -Underwent debridement of skin, subcutaneous tissue, muscle left gluteal and pararectal space and cultures obtained.  -  underwent dressing change of left gluteal wound under anesthesia per Dr. Donne Hazel on 02/19/2017.  - was on empiric IV antibiotics with IV Zosyn and IV clindamycin and subsequently transitioned to IV Unasyn and then to oral Augmentin. Patient has finished course of antibiotics. Patient currently afebrile. Patient undergoing wound dressing changes. - Outpatient follow-up  with plastic surgery with Dr Leland Johns. Dr. Iran Planas has indicated, that she would be willing to see patient in 3-4 days at OP follow up and is aware of impending d/c to skilled nursing facility    acute kidney injury - Secondary to sepsis physiology and in the setting of ACE inhibitor/Aldactone.  - Resolved, creatinine function has normalized. Discontinue lisinopril, aldactone.    hypokalemia/hypomagnesemia Replaced  OHSS versus volume overload Patient was placed on Lasix IV with good diuresis. IV Lasix changed tooral Lasix with clinical improvement. Weight 450lbs at the time of admission, down to 425 lbs at discharge, negative balance of 27 L.  anemia Status post transfusion. H&H stable.   iatrogenic diarrhea Patient noted to have stools loose. Discontinued stool softener. Flexi seal placed to decrease wound contamination. -C. difficile negative, placed on scheduled Imodium and continue Questran. Please dc Imodium and Questran once diarrhea is improved    Day of Discharge BP 124/65 (BP Location: Left Wrist)   Pulse 97   Temp 97.8 F (36.6 C) (Oral)   Resp 18   Ht 5\' 4"  (1.626 m)   Wt (!) 192.8  kg (425 lb)   LMP 02/01/2017 (Approximate)   SpO2 97%   BMI 72.95 kg/m   Physical Exam: General: Alert and awake oriented x3 not in any acute distress. HEENT: anicteric sclera, pupils reactive to light and accommodation CVS: S1-S2 clear no murmur rubs or gallops Chest: clear to auscultation bilaterally, no wheezing rales or rhonchi Abdomen:  morbidly obese, soft nontender, nondistended, normal bowel sounds Extremities: no cyanosis, clubbing or edema noted bilaterally Neuro: Cranial nerves II-XII intact, no focal neurological deficits Skin: flexi seal in place, wound dressings on buttocks   The results of significant diagnostics from this hospitalization (including imaging, microbiology, ancillary and laboratory) are listed below for reference.    LAB RESULTS: Basic  Metabolic Panel:  Recent Labs Lab 03/04/17 0723  03/08/17 0519 03/09/17 0752  NA 132*  < > 133* 132*  K 3.9  < > 3.4* 4.5  CL 100*  < > 98* 99*  CO2 23  < > 27 24  GLUCOSE 117*  < > 104* 100*  BUN 10  < > 10 10  CREATININE 0.89  < > 0.88 0.73  CALCIUM 8.6*  < > 9.0 8.9  MG 2.3  --   --   --   < > = values in this interval not displayed. Liver Function Tests: No results for input(s): AST, ALT, ALKPHOS, BILITOT, PROT, ALBUMIN in the last 168 hours. No results for input(s): LIPASE, AMYLASE in the last 168 hours. No results for input(s): AMMONIA in the last 168 hours. CBC:  Recent Labs Lab 03/06/17 0330 03/09/17 1208  WBC 7.8 9.6  NEUTROABS 5.4  --   HGB 7.9* 7.7*  HCT 27.2* 25.3*  MCV 89.8 89.1  PLT 576* 419*   Cardiac Enzymes: No results for input(s): CKTOTAL, CKMB, CKMBINDEX, TROPONINI in the last 168 hours. BNP: Invalid input(s): POCBNP CBG: No results for input(s): GLUCAP in the last 168 hours.  Significant Diagnostic Studies:  Dg Chest Port 1 View  Result Date: 02/16/2017 CLINICAL DATA:  Postoperative debridement of gluteal necrotic lesion. Hypoxia. EXAM: PORTABLE CHEST 1 VIEW COMPARISON:  February 15, 2017 FINDINGS: Endotracheal tube tip is 4.7 cm above the carina. Central catheter tip is in the superior vena cava. Nasogastric tube tip is at the gastroesophageal junction. The tube deviates toward the right in the lower chest, likely due to a hiatal hernia. There is no appreciable edema or consolidation. There is right midlung atelectatic change. There is cardiomegaly with pulmonary venous hypertension. No evident adenopathy. No bone lesions. IMPRESSION: Tube and catheter positions as described without pneumothorax. There is pulmonary vascular congestion. There is right midlung atelectasis. No frank edema or consolidation. Electronically Signed   By: Lowella Grip III M.D.   On: 02/16/2017 11:08   Dg Chest Port 1 View  Result Date: 02/15/2017 CLINICAL DATA:  Possible  sepsis. History of asthma, hypertension and pulmonary embolism. History tachycardia. EXAM: PORTABLE CHEST 1 VIEW COMPARISON:  None. FINDINGS: Study limited by the patient's positioning, AP technique and the patient's body habitus. Cardiac silhouette is mildly enlarged. There is opacity at the left lung base. Mild hazy right mid to lower lung zone opacity is felt be due to the significant overlying soft tissues. No convincing pulmonary edema. No obvious pneumothorax.  No mediastinal or hilar masses. IMPRESSION: 1. Significantly limited study. 2. Mild enlargement of the cardiopericardial silhouette. 3. Possible pneumonia versus atelectasis at the left lung base. Electronically Signed   By: Lajean Manes M.D.   On: 02/15/2017 21:03  Dg Abd Portable 1v  Result Date: 02/16/2017 CLINICAL DATA:  NG tube placement EXAM: PORTABLE ABDOMEN - 1 VIEW COMPARISON:  None. FINDINGS: Nasogastric tube with the tip projecting over the body of the stomach. There is no bowel dilatation to suggest obstruction. There is no evidence of pneumoperitoneum, portal venous gas or pneumatosis. There are no pathologic calcifications along the expected course of the ureters. The osseous structures are unremarkable. IMPRESSION: Nasogastric tube with the tip projecting over the body of the stomach. Electronically Signed   By: Kathreen Devoid   On: 02/16/2017 11:54    2D ECHO:   Disposition and Follow-up: Discharge Instructions    Diet - low sodium heart healthy    Complete by:  As directed    Increase activity slowly    Complete by:  As directed        DISPOSITION: SNF    DISCHARGE FOLLOW-UP Follow-up Information    THIMMAPPA, BRINDA, MD. Schedule an appointment as soon as possible for a visit in 2 week(s).   Specialty:  Plastic Surgery Contact information: [address] [place] [address] [phone]        Larene Beach, MD. Schedule an appointment as soon as possible for a visit in 2 week(s).    Specialty:  Family Medicine Contact information: [address] [place] [address] [phone]            Time spent on Discharge: 45 mins  Signed:   Estill Cotta M.D. [facility] 03/09/2017, 4:08 PM Pager: [phone]  Addendum  Please note per Dr. Gerhard Munch note on 3/20, recommendation by plastic surgery about the wound dressings   left thigh wound/gluteal, morbid obesity In setting of multiple dressing changes per day and stool contamination, silver alginate not warranted. Will return to moist dressings daily and PRN.   Estill Cotta M.D. [facility] 03/10/2017, 12:47 PM  Pager: [phone]

## 2017-03-09 NOTE — Clinical Social Work Note (Addendum)
Received notification from Surveyor, quantity of social work that patient has been accepted at [facility] as a "difficult to place." The bariatric bed and lift have to be delivered and it may not occur today. Surveyor, quantity will follow up with CSW later.  Dayton Scrape, [place] [phone]  3:55 pm Equipment has arrived to SNF. Patient can discharge there today. CSW paged MD.  Dayton Scrape, [place]

## 2017-03-09 NOTE — ED Provider Notes (Signed)
41 year old morbidly obese female who was discharged today after extended stay after septic shock secondary to necrotizing buttocks wound who returns from nursing home after they did not have a large enough that for her. Patient was unable to sit and regular bed facility and they did not have a bariatric bed. For this they sent her back to the emergency department for further management. Social work was available and aware of situation upon arrival in emergency department. They are arranging a bed to come to facility tomorrow and patient will stay in emergency department until a bed is available. No further intervention from emergency medicine standpoint and patient stable at time of transfer of care.   Esaw Grandchild, MD 03/09/17 8826    Virgel Manifold, MD 03/14/17 [phone]

## 2017-03-09 NOTE — Clinical Social Work Note (Signed)
CSW facilitated patient discharge including contacting patient family and facility to confirm patient discharge plans. Clinical information faxed to facility and family agreeable with plan. CSW arranged ambulance transport via [facility] to [facility] at 5:30 pm. RN to call report prior to discharge. [phone])  CSW will sign off for now as social work intervention is no longer needed. Please consult [place] again if new needs arise.  Dayton Scrape, [place]

## 2017-03-09 NOTE — ED Notes (Signed)
The npt wants her flexaseal bag emptied bags irdered from spd

## 2017-03-09 NOTE — Clinical Social Work Placement (Signed)
   CLINICAL SOCIAL WORK PLACEMENT  NOTE  Date:  03/09/2017  Patient Details  Name: Megan Ortiz MRN: [id number] Date of Birth: Sep 09, 1976  Clinical Social Work is seeking post-discharge placement for this patient at the [place] level of care (*CSW will initial, date and re-position this form in  chart as items are completed):      Patient/family provided with [facility] Work Department's list of facilities offering this level of care within the geographic area requested by the patient (or if unable, by the patient's family).      Patient/family informed of their freedom to choose among providers that offer the needed level of care, that participate in Medicare, Medicaid or managed care program needed by the patient, have an available bed and are willing to accept the patient.      Patient/family informed of [facility]'s ownership interest in [facility] and [facility], as well as of the fact that they are under no obligation to receive care at these facilities.  PASRR submitted to EDS on 02/27/17     PASRR number received on       Existing PASRR number confirmed on       FL2 transmitted to all facilities in geographic area requested by pt/family on 02/27/17     FL2 transmitted to all facilities within larger geographic area on       Patient informed that his/her managed care company has contracts with or will negotiate with certain facilities, including the following:        Yes   Patient/family informed of bed offers received.  Patient chooses bed at [facility]     Physician recommends and patient chooses bed at      Patient to be transferred to [facility] on 03/09/17.  Patient to be transferred to facility by [facility]     Patient family notified on 03/09/17 of transfer.  Name of family member notified:        PHYSICIAN       Additional Comment:     _______________________________________________ Candie Chroman, LCSW 03/09/2017, 4:24 PM

## 2017-03-09 NOTE — ED Notes (Signed)
Pain med given 

## 2017-03-09 NOTE — Care Management CHF Note (Addendum)
Patient was discharged today to [facility] by [facility], and upon arrival patient requested that [facility] return to her  [facility] ED stating the mattress was very thin and not suitable for her wound healing.  CM and CSW met with patient and hiusband at bedside to discuss care transition plan and to explain patient is no longer needing acute care and the  options to be discharged home with charity care [facility] services, or She could return to [facility] also discussed  the order for the Bariatric overlay mattress. Patient and husband agreeable to return tomorrow.  Updated EDP and Recardo Evangelist. This CM will handoff to weekend ED CM to contact [facility] in the morning concerning DME needs.

## 2017-03-09 NOTE — ED Triage Notes (Signed)
The pt arrived by [facility]  The pt was discharged from upstairs 1700 today  She went to [facility] and when p[facility] took the pt there she did not think that the bed  Would hold her.  p[tgar brought her back  Because the bed was not adequate for her weight of 400 plus poumds.  Social worker judy called on the pts arrival here.  Pt alert  Foley in p[lace

## 2017-03-09 NOTE — Progress Notes (Signed)
[facility]                                                                              Patient Demographics  Megan Ortiz, is a 41 y.o. female, DOB - [birth year], PZW:[id number]  Admit date - 02/15/2017   Admitting Physician Rigoberto Noel, MD  Outpatient Primary MD for the patient is No PCP Per Patient  Outpatient specialists:   LOS - 21  days    Chief Complaint  Patient presents with  . Skin Ulcer       Brief summary   Admitted by critical care 02/16/2017 -necrotizing wound of left buttock, admit with MODS [hypotension, tachycardia, MAXIMUM TEMPERATURE 101, needing sepsis protocol, started on Levaquin 5] History of present illness 13/0.6--31/2.4 and admission Lactic acidosis 4.6, white count 42 hemoglobin 8.5 Transferred for Gen. surgery for debridement-wound grew strep viridans mixed anaerobic flora Eventually diuresed after initial volume resuscitation   Assessment & Plan    Principal problem acute respiratory failure secondary to sepsis with underlying diagnosis of asthma and prior tobacco. - Patient was extubated 4/11 but required 4 L during initial recovery and weaned off. Currently O2 sats 96% on room air - Patient on nebulizers and Pulmicort and Atrovent with clinical improvement, follow closely.   Active problems  necrotizing fasciitis with strep very dense/buttocks abscess - Seen by general surgery and plastic surgery during hospitalization  -Underwent debridement of skin, subcutaneous tissue, muscle left gluteal and pararectal space and cultures obtained.  -  underwent dressing change of left gluteal wound under anesthesia per Dr. Donne Hazel on 02/19/2017.  - was on empiric IV antibiotics IV Zosyn and IV clindamycin and subsequently transitioned to IV Unasyn and then to oral Augmentin. Patient finished course of antibiotics. Patient currently afebrile. Patient undergoing wound dressing changes. - Outpatient follow-up with plastic surgery    acute kidney injury - Secondary to sepsis physiology and in the setting of ACE inhibitor/Aldactone.  -Resolved, creatinine function has normalized  hypokalemia/hypomagnesemia Replaced   CHF vs OHSS versus pulmonary hypertension Patient was placed on Lasix IV with good diuresis. IV Lasix changed to oral Lasix. Clinical improvement.  anemia Status post transfusion. H&H stable.   iatrogenic diarrhea Patient noted to have stools loose. Discontinued stool softener. Flexi seal placed to decrease wound contamination. -C. difficile negative, placed on scheduled Imodium and continue Questran.    habitus of OHSS  Code Status: Full CODE STATUS DVT Prophylaxis:  Lovenox Family Communication: Discussed in detail with the patient, all imaging results, lab results explained to the patient   Disposition Plan: Awaiting skilled nursing facility, the patient accepted to [facility] from, possible DC today or tomorrow once confirmed by the Education officer, museum. Patient however now wondering why can't she go home and her husband can try to do the dressing changes.  Time Spent in minutes   25 minutes  Procedures:  Debridement of the subcutaneous tissue, muscle left gluteal and pararectal space 02/16/17 Dressing change left gluteal wound, exam under anesthesia Dr. Donne Hazel 4/9  Consultants:   General surgery Marcello Moores surgery Plastic surgery  Antimicrobials:      Medications  Scheduled Meds: . acetaminophen  1,000  mg Oral Q8H  . budesonide (PULMICORT) nebulizer solution  0.5 mg Nebulization BID  . cholestyramine light  4 g Oral QID  . enoxaparin (LOVENOX) injection  100 mg Subcutaneous Q24H  . furosemide  40 mg Oral BID  . guaiFENesin  600 mg Oral BID  . loperamide  4 mg Oral BID  . pantoprazole  40 mg Oral QHS  . potassium chloride  40 mEq Oral TID  . sertraline  100 mg Oral BID   Continuous Infusions: PRN Meds:.alum & mag hydroxide-simeth, fentaNYL (SUBLIMAZE) injection,  guaiFENesin-dextromethorphan, ipratropium-albuterol, ondansetron (ZOFRAN) IV, ondansetron, oxyCODONE, phenol, sodium chloride, sodium chloride flush   Antibiotics   Anti-infectives    Start     Dose/Rate Route Frequency Ordered Stop   02/25/17 1300  amoxicillin-clavulanate (AUGMENTIN) 875-125 MG per tablet 1 tablet     1 tablet Oral Every 12 hours 02/25/17 1208 03/01/17 2126   02/23/17 0800  Ampicillin-Sulbactam (UNASYN) 3 g in sodium chloride 0.9 % 100 mL IVPB  Status:  Discontinued     3 g 200 mL/hr over 30 Minutes Intravenous Every 8 hours 02/23/17 0504 02/25/17 1208   02/22/17 1200  Ampicillin-Sulbactam (UNASYN) 3 g in sodium chloride 0.9 % 100 mL IVPB  Status:  Discontinued     3 g 200 mL/hr over 30 Minutes Intravenous Every 8 hours 02/22/17 1149 02/23/17 0504   02/16/17 2200  clindamycin (CLEOCIN) IVPB 900 mg  Status:  Discontinued     900 mg 100 mL/hr over 30 Minutes Intravenous Every 8 hours 02/16/17 1418 02/20/17 0959   02/16/17 0600  clindamycin (CLEOCIN) IVPB 600 mg  Status:  Discontinued     600 mg 100 mL/hr over 30 Minutes Intravenous Every 8 hours 02/16/17 0352 02/16/17 1418   02/16/17 0400  piperacillin-tazobactam (ZOSYN) IVPB 3.375 g  Status:  Discontinued     3.375 g 12.5 mL/hr over 240 Minutes Intravenous Every 8 hours 02/16/17 0400 02/22/17 1148   02/16/17 0400  vancomycin (VANCOCIN) 2,000 mg in sodium chloride 0.9 % 500 mL IVPB  Status:  Discontinued     2,000 mg 250 mL/hr over 120 Minutes Intravenous Every 24 hours 02/16/17 0400 02/19/17 0917   02/15/17 2145  clindamycin (CLEOCIN) IVPB 600 mg     600 mg 100 mL/hr over 30 Minutes Intravenous  Once 02/15/17 2140 02/15/17 2347   02/15/17 2000  piperacillin-tazobactam (ZOSYN) IVPB 3.375 g     3.375 g 100 mL/hr over 30 Minutes Intravenous  Once 02/15/17 1955 02/15/17 2131   02/15/17 2000  vancomycin (VANCOCIN) IVPB 1000 mg/200 mL premix     1,000 mg 200 mL/hr over 60 Minutes Intravenous  Once 02/15/17 1955 02/15/17  2348        Subjective:   Megan Ortiz was seen and examined today. Patient denies dizziness, chest pain, shortness of breath, abdominal pain, N/V/D/C, new weakness, numbess, tingling. Still has diarrhea. Patient reports that she's ambulating in the room to the bathroom  Objective:   Vitals:   03/08/17 2100 03/09/17 0619 03/09/17 0913 03/09/17 0940  BP: 117/81 139/63  124/65  Pulse: 89 90  97  Resp:  20  18  Temp: 98.8 F (37.1 C) 98.4 F (36.9 C)  97.8 F (36.6 C)  TempSrc: Oral Oral  Oral  SpO2: 98% 93% 93% 97%  Weight: (!) 192.8 kg (425 lb)     Height:        Intake/Output Summary (Last 24 hours) at 03/09/17 1307 Last data filed at 03/09/17 1010  Gross per 24 hour  Intake             1080 ml  Output             2500 ml  Net            -1420 ml     Wt Readings from Last 3 Encounters:  03/08/17 (!) 192.8 kg (425 lb)  05/11/13 (!) 204.1 kg (450 lb)     Exam  General: Alert and oriented x 3, NAD  HEENT:    Neck: Supple, no JVD  Cardiovascular: S1 S2 auscultated, no rubs, murmurs or gallops. Regular rate and rhythm.  Respiratory: Clear to auscultation bilaterally, no wheezing, rales or rhonchi  Gastrointestinal: Morbidly obese Soft, nontender, nondistended, + bowel sounds  Ext: no cyanosis clubbing or edema  Neuro: No new deficits  Skin: Flexi seal in place, wound dressings  Psych: Normal affect and demeanor, alert and oriented x3    Data Reviewed:  I have personally reviewed following labs and imaging studies  Micro Results Recent Results (from the past 240 hour(s))  C difficile quick scan w PCR reflex     Status: None   Collection Time: 03/08/17 11:04 AM  Result Value Ref Range Status   C Diff antigen NEGATIVE NEGATIVE Final   C Diff toxin NEGATIVE NEGATIVE Final   C Diff interpretation No C. difficile detected.  Final    Radiology Reports Dg Chest Port 1 View  Result Date: 02/21/2017 CLINICAL DATA:  41 year old female status post  surgical debridement of necrotizing soft tissue infection of the left gluteus. Sepsis. Remains intubated, attempted weaning yesterday. EXAM: PORTABLE CHEST 1 VIEW COMPARISON:  02/20/2017 and earlier. FINDINGS: Portable AP semi upright view at 0836 hours. Endotracheal tube tip projects between the clavicles and carina. Stable right IJ central line and visible enteric tube which courses to the abdomen. The patient is more rotated to the right. Patchy and confluent bibasilar opacity greater on the right, probably not significantly changed since yesterday allowing for rotation. No pneumothorax. No pulmonary edema. Possible small right pleural effusion. Stable cardiac size and mediastinal contours. IMPRESSION: 1.  Stable lines and tubes. 2. Stable patchy lung base opacity suspicious for pneumonia. Possible small right pleural effusion. Electronically Signed   By: Genevie Ann M.D.   On: 02/21/2017 08:52   Dg Chest Port 1 View  Result Date: 02/20/2017 CLINICAL DATA:  Respiratory failure.  ETT placement. EXAM: PORTABLE CHEST 1 VIEW COMPARISON:  02/18/2017.  Global FINDINGS: Stable cardiomegaly. Stable cardiomediastinal silhouette. Unchanged tubes and lines. BILATERAL pulmonary opacities, RIGHT greater than LEFT without significant improvement. IMPRESSION: Stable chest. Electronically Signed   By: Staci Righter M.D.   On: 02/20/2017 07:07   Dg Chest Port 1 View  Result Date: 02/18/2017 CLINICAL DATA:  Respiratory failure  ET tube EXAM: PORTABLE CHEST 1 VIEW COMPARISON:  Chest x-rays dated 02/17/2017 and 02/16/2017. FINDINGS: Endotracheal tube is well positioned with tip just above the level of the carina. Enteric tube passes below the diaphragm. Right IJ central line is stable in position with tip at the level of the upper SVC. Stable cardiomegaly. Overall cardiomediastinal silhouette is stable. Patchy bilateral airspace opacities are stable compared to yesterday's exam, slightly increased compared to the earlier exam of  02/16/2017. No pneumothorax seen. IMPRESSION: 1. Patchy bilateral airspace opacities are stable compared to yesterday's exam, but increased compared to the earlier exam of 02/16/2017, consistent with multifocal pneumonia versus asymmetric pulmonary edema. 2. Support apparatus, as detailed above.  Endotracheal tube may have been advanced slightly since yesterday's study, now well positioned just above the level of the carina. 3. Stable cardiomegaly. Electronically Signed   By: Franki Cabot M.D.   On: 02/18/2017 08:17   Dg Chest Port 1 View  Result Date: 02/17/2017 CLINICAL DATA:  Intubation. EXAM: PORTABLE CHEST 1 VIEW COMPARISON:  February 16, 2017 FINDINGS: The ETT has been pulled back somewhat in the interval and terminates 2.8 cm below the thoracic inlet in adequate position. No pneumothorax. A right central line terminates in the central SVC. Increasing bilateral pulmonary opacities, right greater than left suggesting multifocal infiltrate/pneumonia. Cardiomegaly persists. IMPRESSION: 1. The ET tube has been pulled back but still terminates nearly 3 cm below the thoracic inlet in adequate position. Other support apparatus is stable. 2. Increasing bilateral pulmonary opacities. The appearance is concerning for multifocal pneumonia. Recommend clinical correlation and attention on follow-up. Electronically Signed   By: Dorise Bullion III M.D   On: 02/17/2017 07:44   Dg Chest Port 1 View  Result Date: 02/16/2017 CLINICAL DATA:  Postoperative debridement of gluteal necrotic lesion. Hypoxia. EXAM: PORTABLE CHEST 1 VIEW COMPARISON:  February 15, 2017 FINDINGS: Endotracheal tube tip is 4.7 cm above the carina. Central catheter tip is in the superior vena cava. Nasogastric tube tip is at the gastroesophageal junction. The tube deviates toward the right in the lower chest, likely due to a hiatal hernia. There is no appreciable edema or consolidation. There is right midlung atelectatic change. There is cardiomegaly with  pulmonary venous hypertension. No evident adenopathy. No bone lesions. IMPRESSION: Tube and catheter positions as described without pneumothorax. There is pulmonary vascular congestion. There is right midlung atelectasis. No frank edema or consolidation. Electronically Signed   By: Lowella Grip III M.D.   On: 02/16/2017 11:08   Dg Chest Port 1 View  Result Date: 02/15/2017 CLINICAL DATA:  Possible sepsis. History of asthma, hypertension and pulmonary embolism. History tachycardia. EXAM: PORTABLE CHEST 1 VIEW COMPARISON:  None. FINDINGS: Study limited by the patient's positioning, AP technique and the patient's body habitus. Cardiac silhouette is mildly enlarged. There is opacity at the left lung base. Mild hazy right mid to lower lung zone opacity is felt be due to the significant overlying soft tissues. No convincing pulmonary edema. No obvious pneumothorax.  No mediastinal or hilar masses. IMPRESSION: 1. Significantly limited study. 2. Mild enlargement of the cardiopericardial silhouette. 3. Possible pneumonia versus atelectasis at the left lung base. Electronically Signed   By: Lajean Manes M.D.   On: 02/15/2017 21:03   Dg Abd Portable 1v  Result Date: 02/16/2017 CLINICAL DATA:  NG tube placement EXAM: PORTABLE ABDOMEN - 1 VIEW COMPARISON:  None. FINDINGS: Nasogastric tube with the tip projecting over the body of the stomach. There is no bowel dilatation to suggest obstruction. There is no evidence of pneumoperitoneum, portal venous gas or pneumatosis. There are no pathologic calcifications along the expected course of the ureters. The osseous structures are unremarkable. IMPRESSION: Nasogastric tube with the tip projecting over the body of the stomach. Electronically Signed   By: Kathreen Devoid   On: 02/16/2017 11:54    Lab Data:  CBC:  Recent Labs Lab 03/04/17 0723 03/06/17 0330  WBC 10.0 7.8  NEUTROABS  --  5.4  HGB 8.1* 7.9*  HCT 27.9* 27.2*  MCV 90.6 89.8  PLT 565* 576*   Basic  Metabolic Panel:  Recent Labs Lab 03/04/17 0723 03/05/17 0619 03/06/17 0330 03/08/17 0519 03/09/17 0752  NA  132* 136 135 133* 132*  K 3.9 3.6 3.7 3.4* 4.5  CL 100* 101 98* 98* 99*  CO2 23 27 28 27 24   GLUCOSE 117* 105* 106* 104* 100*  BUN 10 10 12 10 10   CREATININE 0.89 0.87 0.82 0.88 0.73  CALCIUM 8.6* 8.9 8.9 9.0 8.9  MG 2.3  --   --   --   --    GFR: Estimated Creatinine Clearance: 160.6 mL/min (by C-G formula based on SCr of 0.73 mg/dL). Liver Function Tests: No results for input(s): AST, ALT, ALKPHOS, BILITOT, PROT, ALBUMIN in the last 168 hours. No results for input(s): LIPASE, AMYLASE in the last 168 hours. No results for input(s): AMMONIA in the last 168 hours. Coagulation Profile: No results for input(s): INR, PROTIME in the last 168 hours. Cardiac Enzymes: No results for input(s): CKTOTAL, CKMB, CKMBINDEX, TROPONINI in the last 168 hours. BNP (last 3 results) No results for input(s): PROBNP in the last 8760 hours. HbA1C: No results for input(s): HGBA1C in the last 72 hours. CBG: No results for input(s): GLUCAP in the last 168 hours. Lipid Profile: No results for input(s): CHOL, HDL, LDLCALC, TRIG, CHOLHDL, LDLDIRECT in the last 72 hours. Thyroid Function Tests: No results for input(s): TSH, T4TOTAL, FREET4, T3FREE, THYROIDAB in the last 72 hours. Anemia Panel: No results for input(s): VITAMINB12, FOLATE, FERRITIN, TIBC, IRON, RETICCTPCT in the last 72 hours. Urine analysis:    Component Value Date/Time   COLORURINE AMBER (A) 02/16/2017 0100   APPEARANCEUR HAZY (A) 02/16/2017 0100   LABSPEC 1.026 02/16/2017 0100   PHURINE 5.0 02/16/2017 0100   GLUCOSEU NEGATIVE 02/16/2017 0100   HGBUR NEGATIVE 02/16/2017 0100   BILIRUBINUR NEGATIVE 02/16/2017 0100   KETONESUR NEGATIVE 02/16/2017 0100   PROTEINUR NEGATIVE 02/16/2017 0100   UROBILINOGEN 0.2 05/11/2013 1903   NITRITE NEGATIVE 02/16/2017 0100   LEUKOCYTESUR NEGATIVE 02/16/2017 0100     Loretta Doutt  M.D. [facility] 03/09/2017, 1:07 PM  Pager: [phone] Between 7am to 7pm - call Pager - [phone]  After 7pm go to [email or web address] - password [email or web address]  Call night coverage person covering after 7pm

## 2017-03-09 NOTE — Progress Notes (Signed)
   Events noted. Pending transfer to [facility]. She notes continued loose stools and asks when she can have Foley and rectal tube removed.   Discussed with SW and patient that I am unable to see her in my office as she is over our exam room tables' weight limit. If wound care needs management outside of [facility] recommend [facility] or [facility]. Discussed with patient latter has surgeons on staff. Patient herself appears committed at this time to no surgery and goal to return home.   Irene Limbo, MD [facility] [phone], pin [phone]

## 2017-03-10 MED ORDER — LOPERAMIDE HCL 2 MG PO CAPS
4.0000 mg | ORAL_CAPSULE | Freq: Once | ORAL | Status: AC
  Administered 2017-03-10: 4 mg via ORAL
  Filled 2017-03-10: qty 2

## 2017-03-10 NOTE — ED Notes (Signed)
Spoke with ED resident who saw patient yesterday in the ED. Patients current plan of care that was determined with her, her husband and case management was for her to be discharged to home with home health care and bariatric walker. Pt currently has rectal tube and foley catheter in place, pt concerned wanting to have these removed. MD to follow up.

## 2017-03-10 NOTE — ED Notes (Signed)
Case management at bedside.

## 2017-03-10 NOTE — Progress Notes (Signed)
Spoke with spouse and pt to discuss options and make final disposition plan. Spouse not willing to have pt transferred to [facility] with the bed that is presently at the facility and understands that he is accepting responsibility for her care. He believes he can and will care for her better at home if he can have dressing supplies and Bariatric walker for her to use. Spoke to [place], The rep for [facility] who will speak to the pt and spouse about charity DME/HHRN for wound care as soon as he is able.

## 2017-03-10 NOTE — Progress Notes (Signed)
CSW and RNCM went and spoke with patient's husband regarding plans for disposition. Husband expressed great concern regarding the patient going back to the facility with no additional support. RNCM made husband aware of options available. Husband reported he would like to take the patient home with hopes that he could get homehealth assistance. CSW and RNCM provided brief counseling to the patient and husband. At this time, there are no other CSW needs. CSW is appreciative of the consult and advised that CSW be re-consulted if further CSW needs arise.   CSW to sign off.   Lucius Conn, [place] [facility] Ph: [phone]

## 2017-03-10 NOTE — Care Management Note (Addendum)
Case Management Note  Patient Details  Name: Natayah Warmack MRN: [id number] Date of Birth: 25-Oct-1976  Subjective/Objective:  41 y.o. F returned to the ED 03/10/2017 by Corey Harold after refusing to stay at [facility] where the Bariatric Bed available was found unsuitable for her body Mass of 425 lbs and necrotizing Buttocks wound. Pt also has diarrhea stools which have interfered with wound healing and currently has Flexiseal in place. Understandably unable to tolerate hard inflexible mattress. CM discussed alternative measures such as the possibility that spouse could purchase egg crate mattress like she uses at home to be used at [facility]. Agreeable to return to SNF. CM also suggested Donut to be used for sitting. Discouraged prolonged sitting.                    Action/Plan:CM will sign off for now but will be available should additional discharge needs arise or disposition change. CSW will arrange transport when Donut delivered and Egg Crate purchased by spouse.    Expected Discharge Date:                  Expected Discharge Plan:  [place] (Return to [facility])  In-House Referral:  Clinical Social Work  Discharge planning Services  CM Consult  Post Acute Care Choice:  Durable Medical Equipment Choice offered to:  Patient  DME Arranged:  N/A (consult for Gel overlay for Bariatric Bed to be delivered to SNF) DME Agency:  NA  HH Arranged:  NA HH Agency:  NA  Status of Service:  Completed, signed off  If discussed at [place] of Stay Meetings, dates discussed:    Additional Comments:  Delrae Sawyers, RN 03/10/2017, 10:31 AM

## 2017-03-10 NOTE — ED Notes (Signed)
Changed patient's dressing. Removed rectal tube and foley and cleaned patient with incontinence spray.

## 2017-03-10 NOTE — ED Notes (Signed)
Diet tray ordered for patient.

## 2017-03-10 NOTE — ED Notes (Signed)
Large wound noted to right gluteal. Wound covered in stool leaking from rectal tube. Very small amount of bleeding noted. Foul smell from wound.

## 2017-03-10 NOTE — Progress Notes (Signed)
Investigated situation for this unfortunate pt who was transferred to SNF from hospital on 03/09/2017 only to be returned to ED because pt refused to stay without gel overlay mattress for her Bariatric bed. Pt is "difficult to place" pt and also has buttock wounds, weighs 425 lbs and is near charity care. SW director has been involved in placement and has made it clear that pt can choose between Home or back to [facility] with amenities available. Will work with [employer], [place] to return this pt to SNF or home.

## 2017-03-10 NOTE — Progress Notes (Signed)
CSW and RNCM spoke with patient regarding plans for disposition. Patient is alert and oriented and very pleasant in assessment. Per patient, she was told by the facility to return to the hospital for further assistance. Patient reports concerns with bed at facility. CSW and RNCM provided patient with supportive feedback and expresssed understanding of her feelings. RNCM informed patient that RNCM and CSW have followed up with leadership regarding alternatives. Patient made aware that there are no other options but to return to the facility or return home. Patient expressed great concern about returning home without the assistance needed and putting more work on her husband. RNCM offered suggestions that the patient can recommend at the facility. Patient reports she believes she would like to return back to the facility, and have her husband purchase an egg mattress. CSW and RNCM provided support to patient. Patient reports she will follow up with CSW once she has spoken to her husband. No other concerns to report at this time.   CSW will follow up with Rober Minion to inform. Med Necessity printed and placed in patient room. CSW will assist with patient transport back to facility once ready for discharge. CSW will also continue to follow and provide support to patient and family while in [place], [place] [facility] Ph: [phone]

## 2017-03-10 NOTE — Care Management Note (Signed)
Case Management Note  Patient Details  Name: Megan Ortiz MRN: [id number] Date of Birth: May 06, 1976  Subjective/Objective:  Multiple calls re: Gluteal Wound dressing changes so this can be included in [facility] orders. Discussed with Dr. Tana Coast, MD as she was the attending on the day of discharge to SNF. She has addended  her final DC summary to include Wound Care to Gluteal Area:  Moist dressing changes daily (due to fecal contamination) and prn.                   Action/Plan: Will continue to follow   Expected Discharge Date:                  Expected Discharge Plan:  [place] (Return to [facility])  In-House Referral:  Clinical Social Work  Discharge planning Services  CM Consult  Post Acute Care Choice:  Durable Medical Equipment Choice offered to:  Patient  DME Arranged:  N/A (consult for Gel overlay for Bariatric Bed to be delivered to SNF) DME Agency:  NA  HH Arranged:  NA HH Agency:  NA  Status of Service:  Completed, signed off  If discussed at [place] of Stay Meetings, dates discussed:    Additional Comments:  Delrae Sawyers, RN 03/10/2017, 1:12 PM

## 2017-03-10 NOTE — ED Notes (Signed)
The pt c/o nausea  zofran given

## 2017-03-12 ENCOUNTER — Encounter: Payer: Self-pay | Admitting: Internal Medicine

## 2017-03-12 NOTE — Progress Notes (Signed)
: Provider:  Noah Delaine. Sheppard Coil, MD Location:  [facility] Room Number: [phone] Place of Service:  SNF (31)  PCP: Larene Beach, MD Patient Care Team: Larene Beach, MD as PCP - General (Family Medicine)  Extended Emergency Contact Information Primary Emergency Contact: Brickner,Beattie Address: [address]          [place], [place] [address] Johnnette Litter of [place] Phone: [phone] Mobile Phone: [phone] Relation: Spouse Secondary Emergency Contact: Linda of [place] Phone: [phone] Relation: Mother     Allergies: Aspirin  Chief Complaint  Patient presents with  . New Admit To SNF    Admit to Facility    HPI: Patient is 41 y.o. female who  Past Medical History:  Diagnosis Date  . Anemia   . Anxiety   . Asthma   . Gastric ulcer   . Headache   . Hiatal hernia   . Hypertension   . PE (pulmonary embolism)   . Tachycardia     Past Surgical History:  Procedure Laterality Date  . CESAREAN SECTION    . CHOLECYSTECTOMY    . INCISION AND DRAINAGE ABSCESS Left 02/16/2017   Procedure: DEBRIDEMENT LEFT BUTTOCK ABSCESS;  Surgeon: Fanny Skates, MD;  Location: [facility];  Service: General;  Laterality: Left;  . IRRIGATION AND DEBRIDEMENT BUTTOCKS Left 02/19/2017   Procedure: DEBRIDEMENT OF GLUTEAL WOUND;  Surgeon: Rolm Bookbinder, MD;  Location: [facility];  Service: General;  Laterality: Left;    Allergies as of 03/12/2017      Reactions   Aspirin    Due to hx of stomach ulcers      Medication List       Accurate as of 03/12/17  2:09 PM. Always use your most recent med list.          acetaminophen 500 MG tablet Commonly known as:  TYLENOL Take 500 mg by mouth every 6 (six) hours as needed for moderate pain.   cholestyramine light 4 g packet Commonly known as:  PREVALITE Take 1 packet (4 g total) by mouth 4 (four) times daily. Continue until diarrhea has stopped   furosemide 40 MG  tablet Commonly known as:  LASIX Take 1 tablet (40 mg total) by mouth 2 (two) times daily.   guaiFENesin 600 MG 12 hr tablet Commonly known as:  MUCINEX Take 1 tablet (600 mg total) by mouth 2 (two) times daily.   loperamide 2 MG capsule Commonly known as:  IMODIUM Take 2 capsules (4 mg total) by mouth 2 (two) times daily. Continue until the diarrhea stops   montelukast 10 MG tablet Commonly known as:  SINGULAIR Take 10 mg by mouth daily.   multivitamin with minerals Tabs tablet Take 1 tablet by mouth daily.   ondansetron 4 MG tablet Commonly known as:  ZOFRAN Take 1 tablet (4 mg total) by mouth every 8 (eight) hours as needed for nausea or vomiting.   oxyCODONE 5 MG immediate release tablet Commonly known as:  Oxy IR/ROXICODONE Take 1-2 tablets (5-10 mg total) by mouth every 6 (six) hours as needed for severe pain.   pantoprazole 40 MG tablet Commonly known as:  PROTONIX Take 1 tablet (40 mg total) by mouth at bedtime.   sertraline 100 MG tablet Commonly known as:  ZOLOFT Take 1 tablet (100 mg total) by mouth 2 (two) times daily.   topiramate 50 MG tablet Commonly known as:  TOPAMAX Take 50 mg by mouth daily.   VENTOLIN HFA  108 (90 Base) MCG/ACT inhaler Generic drug:  albuterol Inhale 2 puffs into the lungs every 6 (six) hours as needed for wheezing.       No orders of the defined types were placed in this encounter.   Immunization History  Administered Date(s) Administered  . Influenza,inj,Quad PF,36+ Mos 09/29/2013  . Influenza,inj,quad, With Preservative 08/13/2015  . Td 05/13/2013  . Tdap 06/11/2013    Social History  Substance Use Topics  . Smoking status: Former Research scientist (life sciences)  . Smokeless tobacco: Never Used     Comment: QUIT ABOUT 5 YEARS AGO  . Alcohol use Yes     Comment: occa    Family history is   Family History  Problem Relation Age of Onset  . Depression Mother   . Alcohol abuse Mother       Review of Systems  DATA OBTAINED: from  patient, nurse, medical record, family member GENERAL:  no fevers, fatigue, appetite changes SKIN: No itching, or rash EYES: No eye pain, redness, discharge EARS: No earache, tinnitus, change in hearing NOSE: No congestion, drainage or bleeding  MOUTH/THROAT: No mouth or tooth pain, No sore throat RESPIRATORY: No cough, wheezing, SOB CARDIAC: No chest pain, palpitations, lower extremity edema  GI: No abdominal pain, No N/V/D or constipation, No heartburn or reflux  GU: No dysuria, frequency or urgency, or incontinence  MUSCULOSKELETAL: No unrelieved bone/joint pain NEUROLOGIC: No headache, dizziness or focal weakness PSYCHIATRIC: No c/o anxiety or sadness   There were no vitals filed for this visit.  SpO2 Readings from Last 1 Encounters:  03/10/17 100%   Body mass index is 72.95 kg/m.     Physical Exam  GENERAL APPEARANCE: Alert, conversant,  No acute distress.  SKIN: No diaphoresis rash HEAD: Normocephalic, atraumatic  EYES: Conjunctiva/lids clear. Pupils round, reactive. EOMs intact.  EARS: External exam WNL, canals clear. Hearing grossly normal.  NOSE: No deformity or discharge.  MOUTH/THROAT: Lips w/o lesions  RESPIRATORY: Breathing is even, unlabored. Lung sounds are clear   CARDIOVASCULAR: Heart RRR no murmurs, rubs or gallops. No peripheral edema.   GASTROINTESTINAL: Abdomen is soft, non-tender, not distended w/ normal bowel sounds. GENITOURINARY: Bladder non tender, not distended  MUSCULOSKELETAL: No abnormal joints or musculature NEUROLOGIC:  Cranial nerves 2-12 grossly intact. Moves all extremities  PSYCHIATRIC: Mood and affect appropriate to situation, no behavioral issues  Patient Active Problem List   Diagnosis Date Noted  . Benign essential HTN   . Uncomplicated asthma   . Super-super obese ([facility])   . Adjustment disorder with mixed anxiety and depressed mood   . Hyperglycemia   . Leukocytosis   . Acute blood loss anemia   . Acute hypoxemic respiratory  failure ([facility])   . AKI (acute kidney injury) ([facility])   . Septic shock ([facility]) 02/16/2017  . Necrotizing soft tissue infection 02/16/2017  . Generalized anxiety disorder 12/22/2014      Labs reviewed: Basic Metabolic Panel:    Component Value Date/Time   NA 132 (L) 03/09/2017 0752   NA 132 (A) 03/09/2017   K 4.5 03/09/2017 0752   CL 99 (L) 03/09/2017 0752   CO2 24 03/09/2017 0752   GLUCOSE 100 (H) 03/09/2017 0752   BUN 10 03/09/2017 0752   BUN 10 03/09/2017   CREATININE 0.73 03/09/2017 0752   CALCIUM 8.9 03/09/2017 0752   PROT 7.0 02/26/2017 0452   ALBUMIN 2.2 (L) 02/26/2017 0452   AST 21 02/26/2017 0452   ALT 17 02/26/2017 0452   ALKPHOS 59 02/26/2017 0452  BILITOT 0.4 02/26/2017 0452   GFRNONAA >60 03/09/2017 0752   GFRAA >60 03/09/2017 0752     Recent Labs  02/19/17 1615 02/20/17 0308 02/21/17 0342  02/26/17 0452 02/28/17 0908  03/04/17 0723  03/06/17 0330 03/08/17 03/08/17 0519 03/09/17 03/09/17 0752  NA  --  144 146*  < > 138 137  < > 132*  < > 135 133* 133* 132* 132*  K  --  3.5 2.6*  < > 3.6 4.2  < > 3.9  < > 3.7 3.4 3.4*  --  4.5  CL  --  111 110  < > 97* 100*  < > 100*  < > 98*  --  98*  --  99*  CO2  --  23 28  < > 30 27  < > 23  < > 28  --  27  --  24  GLUCOSE  --  107* 118*  < > 117* 110*  < > 117*  < > 106*  --  104*  --  100*  BUN  --  40* 34*  < > 12 14  < > 10  < > 12 10 10 10 10   CREATININE  --  1.46* 1.27*  < > 1.09* 1.11*  < > 0.89  < > 0.82 0.9 0.88 0.7 0.73  CALCIUM  --  7.9* 8.3*  < > 9.2 9.0  < > 8.6*  < > 8.9  --  9.0  --  8.9  MG 2.0 2.1 1.8  < > 1.9 2.3  --  2.3  --   --   --   --   --   --   PHOS 4.0 4.9* 3.3  --   --   --   --   --   --   --   --   --   --   --   < > = values in this interval not displayed. Liver Function Tests:  Recent Labs  02/15/17 1955 02/24/17 0457 02/26/17 0452  AST 33 20 21  ALT 19 20 17   ALKPHOS 69 63 59  BILITOT 0.8 0.5 0.4  PROT 7.3 6.5 7.0  ALBUMIN 3.0* 1.9* 2.2*   No results for input(s): LIPASE,  AMYLASE in the last 8760 hours. No results for input(s): AMMONIA in the last 8760 hours. CBC:  Recent Labs  02/28/17 0908 03/01/17 0436 03/04/17 0723 03/06/17 0330 03/09/17 03/09/17 1208  WBC 14.7* 16.1* 10.0 7.8 9.6 9.6  NEUTROABS 12.2* 13.2*  --  5.4  --   --   HGB 8.3* 8.1* 8.1* 7.9*  --  7.7*  HCT 26.5* 27.1* 27.9* 27.2*  --  25.3*  MCV 90.1 90.6 90.6 89.8  --  89.1  PLT 448* 482* 565* 576*  --  419*   Lipid  Recent Labs  02/16/17 1228 02/19/17 1216  TRIG 101 143    Cardiac Enzymes:  Recent Labs  02/16/17 0502  TROPONINI 0.11*   BNP: No results for input(s): BNP in the last 8760 hours. No results found for: [facility] Lab Results  Component Value Date   HGBA1C 5.4 02/26/2017   No results found for: TSH No results found for: VITAMINB12 No results found for: FOLATE No results found for: IRON, TIBC, FERRITIN  Imaging and Procedures obtained prior to SNF admission: No results found.   Not all labs, radiology exams or other studies done during hospitalization come through on my EPIC note; however they are reviewed by me.  Assessment and Plan  No problem-specific Assessment & Plan notes found for this encounter.   Noah Delaine. Sheppard Coil, MD   This encounter was created in error - please disregard.

## 2017-04-13 NOTE — Addendum Note (Signed)
Addendum  created 04/13/17 1330 by Rica Koyanagi, MD   Sign clinical note

## 2019-05-24 ENCOUNTER — Emergency Department (HOSPITAL_COMMUNITY): Payer: Self-pay

## 2019-05-24 ENCOUNTER — Other Ambulatory Visit: Payer: Self-pay

## 2019-05-24 ENCOUNTER — Encounter (HOSPITAL_COMMUNITY): Payer: Self-pay | Admitting: Emergency Medicine

## 2019-05-24 ENCOUNTER — Inpatient Hospital Stay (HOSPITAL_COMMUNITY)
Admission: EM | Admit: 2019-05-24 | Discharge: 2019-06-03 | DRG: 299 | Disposition: A | Discharge status: Home Health | Payer: Self-pay | Attending: Internal Medicine | Admitting: Internal Medicine

## 2019-05-24 ENCOUNTER — Inpatient Hospital Stay (HOSPITAL_COMMUNITY): Payer: Self-pay

## 2019-05-24 DIAGNOSIS — I82409 Acute embolism and thrombosis of unspecified deep veins of unspecified lower extremity: Secondary | ICD-10-CM

## 2019-05-24 DIAGNOSIS — E872 Acidosis: Secondary | ICD-10-CM | POA: Diagnosis present

## 2019-05-24 DIAGNOSIS — J45909 Unspecified asthma, uncomplicated: Secondary | ICD-10-CM | POA: Diagnosis present

## 2019-05-24 DIAGNOSIS — G9341 Metabolic encephalopathy: Secondary | ICD-10-CM | POA: Diagnosis present

## 2019-05-24 DIAGNOSIS — D638 Anemia in other chronic diseases classified elsewhere: Secondary | ICD-10-CM

## 2019-05-24 DIAGNOSIS — I82411 Acute embolism and thrombosis of right femoral vein: Principal | ICD-10-CM | POA: Diagnosis present

## 2019-05-24 DIAGNOSIS — D649 Anemia, unspecified: Secondary | ICD-10-CM

## 2019-05-24 DIAGNOSIS — Z87891 Personal history of nicotine dependence: Secondary | ICD-10-CM

## 2019-05-24 DIAGNOSIS — D509 Iron deficiency anemia, unspecified: Secondary | ICD-10-CM | POA: Diagnosis present

## 2019-05-24 DIAGNOSIS — R7989 Other specified abnormal findings of blood chemistry: Secondary | ICD-10-CM | POA: Diagnosis present

## 2019-05-24 DIAGNOSIS — F411 Generalized anxiety disorder: Secondary | ICD-10-CM | POA: Diagnosis present

## 2019-05-24 DIAGNOSIS — E860 Dehydration: Secondary | ICD-10-CM | POA: Diagnosis present

## 2019-05-24 DIAGNOSIS — I82401 Acute embolism and thrombosis of unspecified deep veins of right lower extremity: Secondary | ICD-10-CM | POA: Diagnosis present

## 2019-05-24 DIAGNOSIS — Z86718 Personal history of other venous thrombosis and embolism: Secondary | ICD-10-CM

## 2019-05-24 DIAGNOSIS — F4323 Adjustment disorder with mixed anxiety and depressed mood: Secondary | ICD-10-CM | POA: Diagnosis present

## 2019-05-24 DIAGNOSIS — N179 Acute kidney failure, unspecified: Secondary | ICD-10-CM | POA: Diagnosis present

## 2019-05-24 DIAGNOSIS — Z95828 Presence of other vascular implants and grafts: Secondary | ICD-10-CM

## 2019-05-24 DIAGNOSIS — I2699 Other pulmonary embolism without acute cor pulmonale: Secondary | ICD-10-CM | POA: Diagnosis present

## 2019-05-24 DIAGNOSIS — A419 Sepsis, unspecified organism: Secondary | ICD-10-CM

## 2019-05-24 DIAGNOSIS — Z20828 Contact with and (suspected) exposure to other viral communicable diseases: Secondary | ICD-10-CM | POA: Diagnosis present

## 2019-05-24 DIAGNOSIS — E662 Morbid (severe) obesity with alveolar hypoventilation: Secondary | ICD-10-CM | POA: Diagnosis present

## 2019-05-24 DIAGNOSIS — R933 Abnormal findings on diagnostic imaging of other parts of digestive tract: Secondary | ICD-10-CM

## 2019-05-24 DIAGNOSIS — M79604 Pain in right leg: Secondary | ICD-10-CM | POA: Diagnosis present

## 2019-05-24 DIAGNOSIS — D72829 Elevated white blood cell count, unspecified: Secondary | ICD-10-CM | POA: Diagnosis present

## 2019-05-24 DIAGNOSIS — E162 Hypoglycemia, unspecified: Secondary | ICD-10-CM | POA: Diagnosis not present

## 2019-05-24 DIAGNOSIS — I890 Lymphedema, not elsewhere classified: Secondary | ICD-10-CM | POA: Diagnosis present

## 2019-05-24 DIAGNOSIS — J181 Lobar pneumonia, unspecified organism: Secondary | ICD-10-CM

## 2019-05-24 DIAGNOSIS — Z7901 Long term (current) use of anticoagulants: Secondary | ICD-10-CM

## 2019-05-24 DIAGNOSIS — G9340 Encephalopathy, unspecified: Secondary | ICD-10-CM

## 2019-05-24 DIAGNOSIS — I9589 Other hypotension: Secondary | ICD-10-CM

## 2019-05-24 DIAGNOSIS — E861 Hypovolemia: Secondary | ICD-10-CM

## 2019-05-24 DIAGNOSIS — I10 Essential (primary) hypertension: Secondary | ICD-10-CM | POA: Diagnosis present

## 2019-05-24 DIAGNOSIS — J189 Pneumonia, unspecified organism: Secondary | ICD-10-CM | POA: Diagnosis present

## 2019-05-24 DIAGNOSIS — Z6841 Body Mass Index (BMI) 40.0 and over, adult: Secondary | ICD-10-CM

## 2019-05-24 DIAGNOSIS — K219 Gastro-esophageal reflux disease without esophagitis: Secondary | ICD-10-CM | POA: Diagnosis present

## 2019-05-24 DIAGNOSIS — J9601 Acute respiratory failure with hypoxia: Secondary | ICD-10-CM | POA: Diagnosis not present

## 2019-05-24 DIAGNOSIS — Z86711 Personal history of pulmonary embolism: Secondary | ICD-10-CM

## 2019-05-24 DIAGNOSIS — I959 Hypotension, unspecified: Secondary | ICD-10-CM | POA: Diagnosis present

## 2019-05-24 LAB — CBC WITH DIFFERENTIAL/PLATELET
Abs Immature Granulocytes: 0.21 10*3/uL — ABNORMAL HIGH (ref 0.00–0.07)
Abs Immature Granulocytes: 0.18 10*3/uL — ABNORMAL HIGH (ref 0.00–0.07)
Basophils Absolute: 0.1 10*3/uL (ref 0.0–0.1)
Basophils Absolute: 0.1 10*3/uL (ref 0.0–0.1)
Basophils Relative: 1 %
Basophils Relative: 1 %
Eosinophils Absolute: 0.4 10*3/uL (ref 0.0–0.5)
Eosinophils Absolute: 0.2 10*3/uL (ref 0.0–0.5)
Eosinophils Relative: 2 %
Eosinophils Relative: 2 %
HCT: 27 % — ABNORMAL LOW (ref 36.0–46.0)
HCT: 24.1 % — ABNORMAL LOW (ref 36.0–46.0)
Hemoglobin: 7.2 g/dL — ABNORMAL LOW (ref 12.0–15.0)
Hemoglobin: 6.5 g/dL — CL (ref 12.0–15.0)
Immature Granulocytes: 1 %
Immature Granulocytes: 1 %
Lymphocytes Relative: 10 %
Lymphocytes Relative: 16 %
Lymphs Abs: 1.9 10*3/uL (ref 0.7–4.0)
Lymphs Abs: 2.3 10*3/uL (ref 0.7–4.0)
MCH: 19.9 pg — ABNORMAL LOW (ref 26.0–34.0)
MCH: 19.6 pg — ABNORMAL LOW (ref 26.0–34.0)
MCHC: 26.7 g/dL — ABNORMAL LOW (ref 30.0–36.0)
MCHC: 27 g/dL — ABNORMAL LOW (ref 30.0–36.0)
MCV: 74.8 fL — ABNORMAL LOW (ref 80.0–100.0)
MCV: 72.8 fL — ABNORMAL LOW (ref 80.0–100.0)
Monocytes Absolute: 0.8 10*3/uL (ref 0.1–1.0)
Monocytes Absolute: 0.8 10*3/uL (ref 0.1–1.0)
Monocytes Relative: 5 %
Monocytes Relative: 5 %
Neutro Abs: 14.9 10*3/uL — ABNORMAL HIGH (ref 1.7–7.7)
Neutro Abs: 10.7 10*3/uL — ABNORMAL HIGH (ref 1.7–7.7)
Neutrophils Relative %: 81 %
Neutrophils Relative %: 75 %
Platelets: 386 10*3/uL (ref 150–400)
Platelets: 333 10*3/uL (ref 150–400)
RBC: 3.61 MIL/uL — ABNORMAL LOW (ref 3.87–5.11)
RBC: 3.31 MIL/uL — ABNORMAL LOW (ref 3.87–5.11)
RDW: 20.7 % — ABNORMAL HIGH (ref 11.5–15.5)
RDW: 20.4 % — ABNORMAL HIGH (ref 11.5–15.5)
WBC: 18.3 10*3/uL — ABNORMAL HIGH (ref 4.0–10.5)
WBC: 14.3 10*3/uL — ABNORMAL HIGH (ref 4.0–10.5)
nRBC: 0.4 % — ABNORMAL HIGH (ref 0.0–0.2)
nRBC: 0.3 % — ABNORMAL HIGH (ref 0.0–0.2)

## 2019-05-24 LAB — COMPREHENSIVE METABOLIC PANEL
ALT: 10 U/L (ref 0–44)
AST: 11 U/L — ABNORMAL LOW (ref 15–41)
Albumin: 3.9 g/dL (ref 3.5–5.0)
Alkaline Phosphatase: 60 U/L (ref 38–126)
Anion gap: 12 (ref 5–15)
BUN: 19 mg/dL (ref 6–20)
CO2: 21 mmol/L — ABNORMAL LOW (ref 22–32)
Calcium: 10.3 mg/dL (ref 8.9–10.3)
Chloride: 100 mmol/L (ref 98–111)
Creatinine, Ser: 1.35 mg/dL — ABNORMAL HIGH (ref 0.44–1.00)
GFR calc Af Amer: 55 mL/min — ABNORMAL LOW (ref >60)
GFR calc non Af Amer: 48 mL/min — ABNORMAL LOW (ref >60)
Glucose, Bld: 140 mg/dL — ABNORMAL HIGH (ref 70–99)
Potassium: 4 mmol/L (ref 3.5–5.1)
Sodium: 133 mmol/L — ABNORMAL LOW (ref 135–145)
Total Bilirubin: 0.5 mg/dL (ref 0.3–1.2)
Total Protein: 8.7 g/dL — ABNORMAL HIGH (ref 6.5–8.1)

## 2019-05-24 LAB — SARS CORONAVIRUS 2 BY RT PCR (HOSPITAL ORDER, PERFORMED IN ~~LOC~~ HOSPITAL LAB): SARS Coronavirus 2: NEGATIVE

## 2019-05-24 LAB — LACTIC ACID, PLASMA
Lactic Acid, Venous: 2.9 mmol/L (ref 0.5–1.9)
Lactic Acid, Venous: 1.8 mmol/L (ref 0.5–1.9)

## 2019-05-24 LAB — APTT: aPTT: 26 seconds (ref 24–36)

## 2019-05-24 LAB — D-DIMER, QUANTITATIVE (NOT AT ARMC): D-Dimer, Quant: 17.1 ug/mL-FEU — ABNORMAL HIGH (ref 0.00–0.50)

## 2019-05-24 LAB — PROTIME-INR
INR: 1.1 (ref 0.8–1.2)
Prothrombin Time: 13.8 seconds (ref 11.4–15.2)

## 2019-05-24 MED ORDER — MONTELUKAST SODIUM 10 MG PO TABS
10.0000 mg | ORAL_TABLET | Freq: Every day | ORAL | Status: DC
  Administered 2019-05-24 – 2019-06-03 (×10): 10 mg via ORAL
  Filled 2019-05-24 (×10): qty 1

## 2019-05-24 MED ORDER — FENTANYL CITRATE (PF) 100 MCG/2ML IJ SOLN
100.0000 ug | INTRAMUSCULAR | Status: AC | PRN
  Administered 2019-05-24 (×3): 100 ug via INTRAVENOUS
  Filled 2019-05-24 (×3): qty 2

## 2019-05-24 MED ORDER — HEPARIN (PORCINE) 25000 UT/250ML-% IV SOLN
2000.0000 [IU]/h | INTRAVENOUS | Status: DC
  Administered 2019-05-24: 2000 [IU]/h via INTRAVENOUS
  Filled 2019-05-24: qty 250

## 2019-05-24 MED ORDER — TOPIRAMATE 25 MG PO TABS
50.0000 mg | ORAL_TABLET | Freq: Every day | ORAL | Status: DC
  Administered 2019-05-25 – 2019-06-03 (×9): 50 mg via ORAL
  Filled 2019-05-24 (×9): qty 2

## 2019-05-24 MED ORDER — VANCOMYCIN HCL IN DEXTROSE 1-5 GM/200ML-% IV SOLN
1000.0000 mg | Freq: Once | INTRAVENOUS | Status: AC
  Administered 2019-05-24: 1000 mg via INTRAVENOUS
  Filled 2019-05-24: qty 200

## 2019-05-24 MED ORDER — SODIUM CHLORIDE 0.9% FLUSH
3.0000 mL | Freq: Two times a day (BID) | INTRAVENOUS | Status: DC
  Administered 2019-05-24 – 2019-06-03 (×13): 3 mL via INTRAVENOUS

## 2019-05-24 MED ORDER — ZOLPIDEM TARTRATE 5 MG PO TABS: 5.0000 mg | ORAL_TABLET | Freq: Every evening | ORAL | Status: DC | PRN

## 2019-05-24 MED ORDER — OXYCODONE HCL 5 MG PO TABS
5.0000 mg | ORAL_TABLET | ORAL | Status: DC | PRN
  Administered 2019-05-25 – 2019-05-27 (×8): 10 mg via ORAL
  Filled 2019-05-24 (×8): qty 2

## 2019-05-24 MED ORDER — DILTIAZEM HCL ER BEADS 180 MG PO CP24: 180.0000 mg | ORAL_CAPSULE | Freq: Every day | ORAL | Status: DC

## 2019-05-24 MED ORDER — IOHEXOL 350 MG/ML SOLN
100.0000 mL | Freq: Once | INTRAVENOUS | Status: AC | PRN
  Administered 2019-05-24: 100 mL via INTRAVENOUS

## 2019-05-24 MED ORDER — CEFEPIME HCL 2 G IJ SOLR
2.0000 g | Freq: Once | INTRAMUSCULAR | Status: AC
  Administered 2019-05-24: 2 g via INTRAVENOUS
  Filled 2019-05-24: qty 2

## 2019-05-24 MED ORDER — SODIUM CHLORIDE 0.9 % IV SOLN
50.0000 ug/h | INTRAVENOUS | Status: DC
  Administered 2019-05-24 – 2019-05-26 (×5): 50 ug/h via INTRAVENOUS
  Filled 2019-05-24 (×6): qty 1

## 2019-05-24 MED ORDER — NORTRIPTYLINE HCL 25 MG PO CAPS
25.0000 mg | ORAL_CAPSULE | Freq: Every day | ORAL | Status: DC
  Administered 2019-05-24 – 2019-05-27 (×4): 25 mg via ORAL
  Filled 2019-05-24 (×6): qty 1

## 2019-05-24 MED ORDER — IPRATROPIUM-ALBUTEROL 0.5-2.5 (3) MG/3ML IN SOLN
3.0000 mL | Freq: Three times a day (TID) | RESPIRATORY_TRACT | Status: DC
  Filled 2019-05-24: qty 3

## 2019-05-24 MED ORDER — HEPARIN SODIUM (PORCINE) 5000 UNIT/ML IJ SOLN
4000.0000 [IU] | Freq: Once | INTRAMUSCULAR | Status: AC
  Administered 2019-05-24: 4000 [IU] via INTRAVENOUS

## 2019-05-24 MED ORDER — SERTRALINE HCL 100 MG PO TABS
100.0000 mg | ORAL_TABLET | Freq: Two times a day (BID) | ORAL | Status: DC
  Administered 2019-05-24 – 2019-05-28 (×8): 100 mg via ORAL
  Filled 2019-05-24 (×9): qty 1

## 2019-05-24 MED ORDER — ONDANSETRON HCL 4 MG PO TABS
4.0000 mg | ORAL_TABLET | Freq: Three times a day (TID) | ORAL | Status: DC | PRN
  Administered 2019-05-26 – 2019-06-03 (×8): 4 mg via ORAL
  Filled 2019-05-24 (×8): qty 1

## 2019-05-24 MED ORDER — SODIUM CHLORIDE 0.9% IV SOLUTION
Freq: Once | INTRAVENOUS | Status: AC
  Administered 2019-05-25: 05:00:00 via INTRAVENOUS

## 2019-05-24 MED ORDER — SODIUM CHLORIDE 0.9% FLUSH: 3.0000 mL | INTRAVENOUS | Status: DC | PRN

## 2019-05-24 MED ORDER — SODIUM CHLORIDE 0.9 % IV SOLN
2.0000 g | Freq: Three times a day (TID) | INTRAVENOUS | Status: AC
  Administered 2019-05-24 – 2019-05-27 (×10): 2 g via INTRAVENOUS
  Filled 2019-05-24 (×10): qty 2

## 2019-05-24 MED ORDER — DOCUSATE SODIUM 100 MG PO CAPS
100.0000 mg | ORAL_CAPSULE | Freq: Two times a day (BID) | ORAL | Status: DC
  Administered 2019-05-25 – 2019-06-01 (×10): 100 mg via ORAL
  Filled 2019-05-24 (×15): qty 1

## 2019-05-24 MED ORDER — SODIUM CHLORIDE 0.9 % IV SOLN
INTRAVENOUS | Status: DC
  Administered 2019-05-24 – 2019-05-25 (×3): via INTRAVENOUS

## 2019-05-24 MED ORDER — DILTIAZEM HCL ER COATED BEADS 180 MG PO CP24
180.0000 mg | ORAL_CAPSULE | Freq: Every day | ORAL | Status: DC
  Administered 2019-05-25 – 2019-05-27 (×3): 180 mg via ORAL
  Filled 2019-05-24 (×3): qty 1

## 2019-05-24 MED ORDER — VANCOMYCIN HCL 1.5 G IV SOLR
1500.0000 mg | Freq: Three times a day (TID) | INTRAVENOUS | Status: DC
  Administered 2019-05-24 – 2019-05-25 (×2): 1500 mg via INTRAVENOUS
  Filled 2019-05-24 (×10): qty 1500

## 2019-05-24 MED ORDER — METRONIDAZOLE IN NACL 5-0.79 MG/ML-% IV SOLN
500.0000 mg | Freq: Once | INTRAVENOUS | Status: AC
  Administered 2019-05-24: 500 mg via INTRAVENOUS
  Filled 2019-05-24: qty 100

## 2019-05-24 MED ORDER — PANTOPRAZOLE SODIUM 40 MG PO TBEC
40.0000 mg | DELAYED_RELEASE_TABLET | Freq: Two times a day (BID) | ORAL | Status: DC
  Administered 2019-05-24: 40 mg via ORAL
  Filled 2019-05-24: qty 1

## 2019-05-24 MED ORDER — LISINOPRIL 10 MG PO TABS
20.0000 mg | ORAL_TABLET | Freq: Every day | ORAL | Status: DC
  Administered 2019-05-25 – 2019-05-26 (×2): 20 mg via ORAL
  Filled 2019-05-24 (×2): qty 2

## 2019-05-24 MED ORDER — FENTANYL CITRATE (PF) 100 MCG/2ML IJ SOLN
25.0000 ug | Freq: Once | INTRAMUSCULAR | Status: AC
  Administered 2019-05-24: 25 ug via INTRAVENOUS
  Filled 2019-05-24: qty 2

## 2019-05-24 MED ORDER — ALBUTEROL SULFATE (2.5 MG/3ML) 0.083% IN NEBU
3.0000 mL | INHALATION_SOLUTION | Freq: Four times a day (QID) | RESPIRATORY_TRACT | Status: DC | PRN
  Administered 2019-06-01: 3 mL via RESPIRATORY_TRACT
  Filled 2019-05-24: qty 3

## 2019-05-24 MED ORDER — SODIUM CHLORIDE 0.9 % IV SOLN: 250.0000 mL | INTRAVENOUS | Status: DC | PRN

## 2019-05-24 MED ORDER — SODIUM CHLORIDE 0.9 % IV BOLUS
1000.0000 mL | Freq: Once | INTRAVENOUS | Status: AC
  Administered 2019-05-24: 1000 mL via INTRAVENOUS

## 2019-05-24 MED ORDER — ACETAMINOPHEN 650 MG RE SUPP: 650.0000 mg | Freq: Four times a day (QID) | RECTAL | Status: DC | PRN

## 2019-05-24 MED ORDER — ACETAMINOPHEN 325 MG PO TABS
650.0000 mg | ORAL_TABLET | Freq: Four times a day (QID) | ORAL | Status: DC | PRN
  Administered 2019-05-31 – 2019-06-01 (×5): 650 mg via ORAL
  Filled 2019-05-24 (×5): qty 2

## 2019-05-24 NOTE — ED Notes (Signed)
Hospitalist at bedside 

## 2019-05-24 NOTE — Progress Notes (Signed)
Pt stated she hasn't urinated since this am. Bladder scan showed >65 ml. When asked if she feels any pressure in bladder area, pt stated only when we are pushing for bladder scan. Will continue to monitor.

## 2019-05-24 NOTE — ED Triage Notes (Signed)
Patient states about 2 days ago she was getting out of the shower when she was walking her legs bumped together and she felt a pop in her right leg. Patient complaining of right leg pain. Patient was able to stand and ambulate to the bed. Patient took 2 Ibuprofen at home a few hours ago and states that it did help with pain but pain level is a 5 currently.

## 2019-05-24 NOTE — ED Notes (Signed)
Spoke with Megan Ortiz in [place]. Pt is unsure of her current weight. She was last weighed over a year ago and was 450lbs. She is unable to stand on a scale currently and her bed does not weigh. Megan Ortiz states pt's girth also exceeds limitations of scanner. MD aware.

## 2019-05-24 NOTE — ED Provider Notes (Signed)
[facility] EMERGENCY DEPARTMENT Provider Note   CSN: [id number] Arrival date & time: 05/24/19  0551     History   Chief Complaint Chief Complaint  Patient presents with   Leg Pain    right leg    HPI Megan Ortiz is a 43 y.o. female.     Patient had a history deep vein thrombosis and pulmonary embolus in the past and has an IVC filter.  Approximately 4 days ago patient heard a pop and got pain in the proximal aspect of her knee.  Swelling developed and got bruising and some of the bruising moved down below the knee since then.  Pain got worse today plus patient was feeling fatigued and feeling a little confused.  Patient did receive 1 L of fluid prior to me seeing her and she stated that she felt much better on that.  She did arrive hypotensive.  But blood pressures came up after 1 L up above 90 systolic.  Patient denied any fever or upper respiratory symptoms.  Denied any chest pain or shortness of breath.     Past Medical History:  Diagnosis Date   Anemia    Anxiety    Asthma    Gastric ulcer    Headache    Hiatal hernia    Hypertension    PE (pulmonary embolism)    Tachycardia     Patient Active Problem List   Diagnosis Date Noted   Benign essential HTN    Uncomplicated asthma    Super-super obese ([place])    Adjustment disorder with mixed anxiety and depressed mood    Hyperglycemia    Leukocytosis    Acute blood loss anemia    Acute hypoxemic respiratory failure (HCC)    AKI (acute kidney injury) ([place])    Septic shock ([place]) 02/16/2017   Necrotizing soft tissue infection 02/16/2017   Generalized anxiety disorder 12/22/2014    Past Surgical History:  Procedure Laterality Date   CESAREAN SECTION     CHOLECYSTECTOMY     INCISION AND DRAINAGE ABSCESS Left 02/16/2017   Procedure: DEBRIDEMENT LEFT BUTTOCK ABSCESS;  Surgeon: Fanny Skates, MD;  Location: [place];  Service: General;  Laterality: Left;   IRRIGATION AND DEBRIDEMENT  BUTTOCKS Left 02/19/2017   Procedure: DEBRIDEMENT OF GLUTEAL WOUND;  Surgeon: Rolm Bookbinder, MD;  Location: [place];  Service: General;  Laterality: Left;     OB History   No obstetric history on file.      Home Medications    Prior to Admission medications   Medication Sig Start Date End Date Taking? Authorizing Provider  acetaminophen (TYLENOL) 500 MG tablet Take 500 mg by mouth every 6 (six) hours as needed for moderate pain.   Yes [provider]  albuterol (VENTOLIN HFA) 108 (90 BASE) MCG/ACT inhaler Inhale 2 puffs into the lungs every 6 (six) hours as needed for wheezing.   Yes [provider]  diltiazem (TIAZAC) 180 MG 24 hr capsule Take 180 mg by mouth daily.   Yes [provider]  esomeprazole (NEXIUM) 20 MG capsule Take 20 mg by mouth daily at 12 noon.   Yes [provider]  lisinopril (ZESTRIL) 20 MG tablet Take 20 mg by mouth daily.   Yes [provider]  montelukast (SINGULAIR) 10 MG tablet Take 10 mg by mouth daily.   Yes [provider]  nortriptyline (PAMELOR) 25 MG capsule Take 25 mg by mouth at bedtime.   Yes [provider]  ondansetron (ZOFRAN) 4  MG tablet Take 1 tablet (4 mg total) by mouth every 8 (eight) hours as needed for nausea or vomiting. 03/09/17  Yes Rai, Ripudeep K, MD  sertraline (ZOLOFT) 100 MG tablet Take 1 tablet (100 mg total) by mouth 2 (two) times daily. 01/19/15  Yes Cloria Spring, MD  topiramate (TOPAMAX) 50 MG tablet Take 50 mg by mouth daily. 01/24/17  Yes [provider]  cholestyramine light (PREVALITE) 4 g packet Take 1 packet (4 g total) by mouth 4 (four) times daily. Continue until diarrhea has stopped Patient not taking: Reported on 05/24/2019 03/09/17   Rai, Vernelle Emerald, MD  furosemide (LASIX) 40 MG tablet Take 1 tablet (40 mg total) by mouth 2 (two) times daily. Patient not taking: Reported on 05/24/2019 03/09/17   Rai, Vernelle Emerald, MD  guaiFENesin (MUCINEX) 600 MG 12 hr  tablet Take 1 tablet (600 mg total) by mouth 2 (two) times daily. Patient not taking: Reported on 05/24/2019 03/09/17   Rai, Vernelle Emerald, MD  loperamide (IMODIUM) 2 MG capsule Take 2 capsules (4 mg total) by mouth 2 (two) times daily. Continue until the diarrhea stops Patient not taking: Reported on 05/24/2019 03/09/17   Rai, Vernelle Emerald, MD  oxyCODONE (OXY IR/ROXICODONE) 5 MG immediate release tablet Take 1-2 tablets (5-10 mg total) by mouth every 6 (six) hours as needed for severe pain. Patient not taking: Reported on 05/24/2019 03/09/17   Rai, Vernelle Emerald, MD  pantoprazole (PROTONIX) 40 MG tablet Take 1 tablet (40 mg total) by mouth at bedtime. Patient not taking: Reported on 05/24/2019 02/27/17   Nita Sells, MD    Family History Family History  Problem Relation Age of Onset   Depression Mother    Alcohol abuse Mother     Social History Social History   Tobacco Use   Smoking status: Former Smoker   Smokeless tobacco: Never Used   Tobacco comment: QUIT ABOUT 5 YEARS AGO  Substance Use Topics   Alcohol use: Yes    Comment: occa   Drug use: No     Allergies   Aspirin   Review of Systems Review of Systems  Constitutional: Positive for fatigue. Negative for chills and fever.  HENT: Negative for congestion, rhinorrhea and sore throat.   Eyes: Negative for visual disturbance.  Respiratory: Negative for cough and shortness of breath.   Cardiovascular: Negative for chest pain and leg swelling.  Gastrointestinal: Negative for abdominal pain, diarrhea, nausea and vomiting.  Genitourinary: Negative for dysuria.  Musculoskeletal: Positive for arthralgias. Negative for back pain and neck pain.  Skin: Negative for rash.  Neurological: Negative for dizziness, light-headedness and headaches.  Hematological: Does not bruise/bleed easily.  Psychiatric/Behavioral: Positive for confusion.     Physical Exam Updated Vital Signs BP (!) 100/56    Pulse 84    Temp 98.3 F (36.8  C) (Oral)    Resp 17    Ht 1.651 m (5\' 5" )    Wt (!) 233.5 kg    LMP 05/05/2019 (Approximate)    SpO2 99%    BMI 85.66 kg/m   Physical Exam Vitals signs and nursing note reviewed.  Constitutional:      General: She is not in acute distress.    Appearance: Normal appearance. She is well-developed.  HENT:     Head: Normocephalic and atraumatic.  Eyes:     Extraocular Movements: Extraocular movements intact.     Conjunctiva/sclera: Conjunctivae normal.     Pupils: Pupils are equal, round, and reactive to light.  Neck:     Musculoskeletal: Neck supple.  Cardiovascular:     Rate and Rhythm: Normal rate and regular rhythm.     Heart sounds: No murmur.  Pulmonary:     Effort: Pulmonary effort is normal. No respiratory distress.     Breath sounds: Normal breath sounds.  Abdominal:     Palpations: Abdomen is soft.     Tenderness: There is no abdominal tenderness.  Musculoskeletal:        General: Swelling present.     Comments: Patient morbidly obese.  But does have bruising and skin discoloration around the right knee.  Looks more like a contusion.  But there is maybe a hint of erythema.  No increased warmth.  Not tender to palpation.  Skin:    General: Skin is warm and dry.     Capillary Refill: Capillary refill takes less than 2 seconds.  Neurological:     General: No focal deficit present.     Mental Status: She is alert and oriented to person, place, and time.      ED Treatments / Results  Labs (all labs ordered are listed, but only abnormal results are displayed) Labs Reviewed  COMPREHENSIVE METABOLIC PANEL - Abnormal; Notable for the following components:      Result Value   Sodium 133 (*)    CO2 21 (*)    Glucose, Bld 140 (*)    Creatinine, Ser 1.35 (*)    Total Protein 8.7 (*)    AST 11 (*)    GFR calc non Af Amer 48 (*)    GFR calc Af Amer 55 (*)    All other components within normal limits  CBC WITH DIFFERENTIAL/PLATELET - Abnormal; Notable for the following  components:   WBC 18.3 (*)    RBC 3.61 (*)    Hemoglobin 7.2 (*)    HCT 27.0 (*)    MCV 74.8 (*)    MCH 19.9 (*)    MCHC 26.7 (*)    RDW 20.7 (*)    nRBC 0.4 (*)    Neutro Abs 14.9 (*)    Abs Immature Granulocytes 0.21 (*)    All other components within normal limits  D-DIMER, QUANTITATIVE (NOT AT [facility]) - Abnormal; Notable for the following components:   D-Dimer, Quant 17.10 (*)    All other components within normal limits  LACTIC ACID, PLASMA - Abnormal; Notable for the following components:   Lactic Acid, Venous 2.9 (*)    All other components within normal limits  CULTURE, BLOOD (ROUTINE X 2)  CULTURE, BLOOD (ROUTINE X 2)  SARS CORONAVIRUS 2 (HOSPITAL ORDER, [place] [facility])  URINE CULTURE  LACTIC ACID, PLASMA  PROTIME-INR  APTT  URINALYSIS, ROUTINE W REFLEX MICROSCOPIC  POC URINE PREG, ED    EKG EKG Interpretation  Date/Time:  Saturday May 24 2019 06:30:37 EDT Ventricular Rate:  95 PR Interval:    QRS Duration: 99 QT Interval:  344 QTC Calculation: 433 R Axis:   50 Text Interpretation:  Sinus rhythm Baseline wander in lead(s) V3 Confirmed by Fredia Sorrow [id number]) on 05/24/2019 8:35:45 AM   Radiology US Venous Img Lower Right (dvt Study)  Result Date: 05/24/2019 CLINICAL DATA:  Right leg pain for 2 days. EXAM: Right LOWER EXTREMITY VENOUS DOPPLER ULTRASOUND TECHNIQUE: Gray-scale sonography with graded compression, as well as color Doppler and duplex ultrasound were performed to evaluate the lower extremity deep venous systems from the level of the common femoral vein and including  the common femoral, femoral, profunda femoral, popliteal and calf veins including the posterior tibial, peroneal and gastrocnemius veins when visible. The superficial great saphenous vein was also interrogated. Spectral Doppler was utilized to evaluate flow at rest and with distal augmentation maneuvers in the common femoral, femoral and popliteal veins. COMPARISON:   None. FINDINGS: Contralateral Common Femoral Vein: Noncompressible acute appearing thrombus. Common Femoral Vein: Noncompressible acute appearing thrombus. Saphenofemoral Junction: Noncompressible acute appearing thrombus. Profunda Femoral Vein: Noncompressible acute appearing thrombus. Femoral Vein: Noncompressible acute appearing thrombus. Popliteal Vein: Noncompressible acute appearing thrombus. Calf Veins: Thrombus evident in the posterior tibial and peroneal veins. Superficial Great Saphenous Vein: Evidence of thrombus. Venous Reflux:  None. Other Findings:  None. IMPRESSION: Diffuse acute deep venous thrombosis extending from the right common femoral vein through the calf veins. Electronically Signed   By: Lajean Manes M.D.   On: 05/24/2019 10:07   Dg Chest Port 1 View  Result Date: 05/24/2019 CLINICAL DATA:  Hypotension EXAM: PORTABLE CHEST 1 VIEW COMPARISON:  February 21 2017 FINDINGS: Airspace opacity is noted in the medial left base. There is cardiomegaly with pulmonary vascularity within normal limits. No adenopathy. No bone lesions. IMPRESSION: Opacity medial left base concerning for consolidation/pneumonia. Lungs elsewhere clear. There is cardiomegaly. No adenopathy evident. Electronically Signed   By: Lowella Grip III M.D.   On: 05/24/2019 09:08   Dg Femur Portable Min 2 Views Right  Result Date: 05/24/2019 CLINICAL DATA:  Reason for exam:Patient states about 2 days ago she was getting out of the shower when she was walking her legs bumped together and she felt a pop in her right leg. Patient complaining of right leg pain. Patient was able to stand and ambulate to the bed. Patient took 2 Ibuprofen at home a few hours ago and states that it did help with pain but pain level is a 5 currently EXAM: RIGHT FEMUR PORTABLE 2 VIEW COMPARISON:  None. FINDINGS: Exam limited by patient's body habitus. Allowing for this, there is no convincing fracture. No bone lesion. Hip and knee joints appear  normally aligned. IMPRESSION: Limited exam.  No convincing fracture.  No dislocation. Electronically Signed   By: Lajean Manes M.D.   On: 05/24/2019 08:05    Procedures Procedures (including critical care time)   CRITICAL CARE Performed by: Fredia Sorrow Total critical care time: 30 minutes Critical care time was exclusive of separately billable procedures and treating other patients. Critical care was necessary to treat or prevent imminent or life-threatening deterioration. Critical care was time spent personally by me on the following activities: development of treatment plan with patient and/or surrogate as well as nursing, discussions with consultants, evaluation of patient's response to treatment, examination of patient, obtaining history from patient or surrogate, ordering and performing treatments and interventions, ordering and review of laboratory studies, ordering and review of radiographic studies, pulse oximetry and re-evaluation of patient's condition.  Medications Ordered in ED Medications  metroNIDAZOLE (FLAGYL) IVPB 500 mg (has no administration in time range)  vancomycin (VANCOCIN) IVPB 1000 mg/200 mL premix (has no administration in time range)  Vancomycin (VANCOCIN) 1,500 mg in sodium chloride 0.9 % 500 mL IVPB (has no administration in time range)  ceFEPIme (MAXIPIME) 2 g in sodium chloride 0.9 % 100 mL IVPB (has no administration in time range)  sodium chloride 0.9 % bolus 1,000 mL (0 mLs Intravenous Stopped 05/24/19 0907)  sodium chloride 0.9 % bolus 1,000 mL (1,000 mLs Intravenous New Bag/Given 05/24/19 0840)  fentaNYL (SUBLIMAZE) injection 25  mcg (25 mcg Intravenous Given 05/24/19 0912)  ceFEPIme (MAXIPIME) 2 g in sodium chloride 0.9 % 100 mL IVPB (2 g Intravenous New Bag/Given 05/24/19 0911)  vancomycin (VANCOCIN) IVPB 1000 mg/200 mL premix ( Intravenous Stopped 05/24/19 0936)     Initial Impression / Assessment and Plan / ED Course  I have reviewed the triage  vital signs and the nursing notes.  Pertinent labs & imaging results that were available during my care of the patient were reviewed by me and considered in my medical decision making (see chart for details).      Hospitalist agree with going and starting her on heparin.  Also we are being told that the CT scan can or could handle her size at [facility] so they are planning to admit her at [facility].  Patient with extensive right leg deep vein thrombosis.  Patient with a past history of either pulmonary embolus or DVT.  But not currently on any blood thinners.  Patient never hypoxic but did have hypotension.  So CT Angie of the chest would be helpful to further evaluate possibility of pulmonary embolus.  Patient initially was hypotensive on arrival.  So code sepsis was started based on that.  And due to some questionable erythema at the right leg where there ended up being a DVT being cellulitis or fat necrosis patient was started on broad-spectrum antibiotics.  Patient received a total of 2 L of normal saline.  After  the first liter blood pressures were up above 90.  Patient was given a second liter.  But did not feel the patient warranted the full 30 cc/kg fluid bolus.  Although lactic acid was elevated was less than 4.  And eventually just with the 2 L came back to normal.  I think the main concern has been the DVT.  In addition patient does have an IVC filter.  So making PE unlikely.  Patient does not have complaint of shortness of breath or chest pain.     Final Clinical Impressions(s) / ED Diagnoses   Final diagnoses:  Hypotension, unspecified hypotension type  Sepsis, due to unspecified organism, unspecified whether acute organ dysfunction present [facility])    ED Discharge Orders    None       Fredia Sorrow, MD 05/24/19 1604

## 2019-05-24 NOTE — Progress Notes (Signed)
Pt received from [facility] via [facility]. VSS. Telemetry applied, CHG complete. Pt oriented to room and unit. Call light in reach, will continue to monitor.  Clyde Canterbury, RN

## 2019-05-24 NOTE — ED Notes (Signed)
81/43 BP on L wrist verified twice w/ small cuff & med cuff

## 2019-05-24 NOTE — ED Provider Notes (Signed)
MSE was initiated and I personally evaluated the patient and placed orders (if any) at  6:25 AM on May 24, 2019.  The patient appears stable so that the remainder of the MSE may be completed by another provider.  Patient states she felt a pop in her mid right inner thigh about 2 days ago since then she has had some pain in her right middle thigh and on the medial aspect of her right knee.  She denies any pain below her knee.  She denies fever.  She states she has a history of PE however she is not on blood thinners anymore.  She denies chest pain or shortness of breath.  Patient is morbidly obese.  She is noted to be hypotensive on arrival.  When I examine her right lower extremity it is enlarged compared to the left.  She has some faint bruising seen on the medial right knee.  When I palpate her right thigh there is no obvious indurations or lumpiness.    Patient was given IV fluids for her hypotension.  Pulmonary blood work was done.  Ultrasound was ordered to look for DVT in her leg.  Plain x-rays were done of her femur.  Patient turned over at change of shift to Dr. Rogene Houston.     Megan Porter, MD 05/24/19 [phone]

## 2019-05-24 NOTE — Progress Notes (Signed)
Radiology called RN to report that CT showed possible active hemorrhage. [place], NP paged. Will report to nightshift RN.  Clyde Canterbury, RN

## 2019-05-24 NOTE — Progress Notes (Signed)
Notified provider of need to order more fluid based off of her weight or document a progress note if this is not appropriate for this patient.

## 2019-05-24 NOTE — Plan of Care (Signed)
Poc progressing.  

## 2019-05-24 NOTE — Progress Notes (Signed)
Pharmacy Antibiotic Note  Neelie Welshans is a 43 y.o. female admitted on 05/24/2019 with sepsis.  Pharmacy has been consulted for vancomycin and cefepime dosing.  Plan: Vancomycin 1500mg  IV every 8 hours.  Goal trough 15-20 mcg/mL. cefepime 2gm iv q8h  Height: 5\' 5"  (165.1 cm) Weight: (!) 514 lb 12.4 oz (233.5 kg) IBW/kg (Calculated) : 57  Temp (24hrs), Avg:98.3 F (36.8 C), Min:98.3 F (36.8 C), Max:98.3 F (36.8 C)  Recent Labs  Lab 05/24/19 0629 05/24/19 0644  WBC 18.3*  --   CREATININE 1.35*  --   LATICACIDVEN  --  2.9*    Estimated Creatinine Clearance: 107.1 mL/min (A) (by C-G formula based on SCr of 1.35 mg/dL (H)).    Allergies  Allergen Reactions  . Aspirin     Due to hx of stomach ulcers    Antimicrobials this admission: 7/11 cefepime >>  7/11 vancomycin >>   Microbiology results: 7/11 BCx: sent  7/11 UCx: sent 7/11 Covid 19: sent     Thank you for allowing pharmacy to be a part of this patient's care.  [name] Marleny Faller 05/24/2019 8:55 AM

## 2019-05-24 NOTE — ED Notes (Signed)
Date and time results received: 05/24/19 0721 (use smartphrase ".now" to insert current time)  Test: Lactic Acid Critical Value: 2.9  Name of Provider Notified: Zackowski  Orders Received? Or Actions Taken?: Orders Received - See Orders for details

## 2019-05-24 NOTE — H&P (Signed)
History and Physical  [name] SWH:[id number] DOB: Jul 09, 1976 DOA: 05/24/2019  Referring physician: Rogene Houston MD  PCP: Larene Beach, MD   Chief Complaint: right leg pain   HPI: Megan Ortiz is a 43 y.o. morbidly obese female with a complex past medical history detailed below including history of pulmonary embolus, history of bleeding gastric ulcers, history of necrotizing fasciitis who has been living at home presented to the emergency department complaining of 2 days of right leg pain.  She reports that upon getting out of the shower 2 days ago she noticed a sharp pain in her right leg that was out of the ordinary.  She says that she took ibuprofen to help relieve the symptoms but the pain persisted.  She denies having cough fever chest pain and shortness of breath.  She denies palpitations.  She says that ambulation makes the leg pain worse.  Pain is 9 out of 10 in severity.  ED course: The patient was hypotensive on arrival with an initial BP of 81/43.  She was started on a sepsis protocol and received bolus IV fluids with improvement in blood pressure to normal.  Her right lower extremity was noted to be markedly enlarged compared to the left.  The patient had a markedly elevated d-dimer of 17.10.  X-rays of the leg did not show any obvious fractures.  Patient had an elevated white blood cell count of 18.3.  Her hemoglobin was 7.2.  Her platelet counts 386.  Her initial lactic acid was 2.9 and improved to 1.8 after fluid boluses.  Doppler ultrasound of the lower extremity reveals a extensive DVT in the right leg.  The patient was sent for CTA but due to her size she was not able to fit into the CT scanner at [facility] and arrangements were made for patient to go to [facility] as they have a scanner that will accommodate patient.  The patient was started on IV heparin infusion and the patient is being admitted to [facility] telemetry.  The patient SARS 2 coronavirus test was  negative.  Chest x-ray showed findings of left basilar consolidation/pneumonia.  Given her history of PE there is concern that she does have a recurrent PE in addition to the DVT.  Review of Systems: All systems reviewed and apart from history of presenting illness, are negative.  Past Medical History:  Diagnosis Date   Anemia    Anxiety    Asthma    Gastric ulcer    Headache    Hiatal hernia    Hypertension    PE (pulmonary embolism)    Tachycardia    Past Surgical History:  Procedure Laterality Date   CESAREAN SECTION     CHOLECYSTECTOMY     INCISION AND DRAINAGE ABSCESS Left 02/16/2017   Procedure: DEBRIDEMENT LEFT BUTTOCK ABSCESS;  Surgeon: Fanny Skates, MD;  Location: [place];  Service: General;  Laterality: Left;   IRRIGATION AND DEBRIDEMENT BUTTOCKS Left 02/19/2017   Procedure: DEBRIDEMENT OF GLUTEAL WOUND;  Surgeon: Rolm Bookbinder, MD;  Location: [place];  Service: General;  Laterality: Left;   Social History:  reports that she has quit smoking. She has never used smokeless tobacco. She reports current alcohol use. She reports that she does not use drugs.  Allergies  Allergen Reactions   Aspirin     Due to hx of stomach ulcers    Family History  Problem Relation Age of Onset   Depression Mother    Alcohol abuse Mother  Prior to Admission medications   Medication Sig Start Date End Date Taking? Authorizing Provider  acetaminophen (TYLENOL) 500 MG tablet Take 500 mg by mouth every 6 (six) hours as needed for moderate pain.   Yes [provider]  albuterol (VENTOLIN HFA) 108 (90 BASE) MCG/ACT inhaler Inhale 2 puffs into the lungs every 6 (six) hours as needed for wheezing.   Yes [provider]  diltiazem (TIAZAC) 180 MG 24 hr capsule Take 180 mg by mouth daily.   Yes [provider]  esomeprazole (NEXIUM) 20 MG capsule Take 20 mg by mouth daily at 12 noon.   Yes [provider]  lisinopril (ZESTRIL) 20 MG tablet  Take 20 mg by mouth daily.   Yes [provider]  montelukast (SINGULAIR) 10 MG tablet Take 10 mg by mouth daily.   Yes [provider]  nortriptyline (PAMELOR) 25 MG capsule Take 25 mg by mouth at bedtime.   Yes [provider]  ondansetron (ZOFRAN) 4 MG tablet Take 1 tablet (4 mg total) by mouth every 8 (eight) hours as needed for nausea or vomiting. 03/09/17  Yes Rai, Ripudeep K, MD  sertraline (ZOLOFT) 100 MG tablet Take 1 tablet (100 mg total) by mouth 2 (two) times daily. 01/19/15  Yes Cloria Spring, MD  topiramate (TOPAMAX) 50 MG tablet Take 50 mg by mouth daily. 01/24/17  Yes [provider]   Physical Exam: Vitals:   05/24/19 1100 05/24/19 1130 05/24/19 1200 05/24/19 1230  BP: (!) 105/59 103/63 106/61 97/66  Pulse: 79 85 82 79  Resp: 17 16 19 18   Temp:      TempSrc:      SpO2: 95% 96% 98% 90%  Weight:      Height:         General exam: Morbidly obese female awake alert and oriented, lying comfortably supine on the gurney in no obvious distress.  Head, eyes and ENT: Nontraumatic and normocephalic. Pupils equally reacting to light and accommodation. Oral mucosa moist.  Neck: Supple. No JVD, carotid bruit or thyromegaly.  Lymphatics: No lymphadenopathy.  Respiratory system: Diminished breath sounds in left lower lobe with basilar Rales heard left lower lobe, no increased work of breathing.  Cardiovascular system: S1 and S2 heard, RRR. No JVD, murmurs.  Bilateral pedal edema.  Gastrointestinal system: Abdomen is obese, soft and nontender. Normal bowel sounds heard. No organomegaly or masses appreciated.   Central nervous system: Alert and oriented. No focal neurological deficits.  Extremities: Swollen and tender right lower extremity from knee to ankle with 2+ pedal edema right lower extremity, left lower extremity less swollen, peripheral pulses symmetrically felt.   Skin: No rashes or acute findings.  Musculoskeletal system: Markedly  swollen right lower extremity as noted above.  Psychiatry: Pleasant and cooperative.  Labs on Admission:  Basic Metabolic Panel: Recent Labs  Lab 05/24/19 0629  NA 133*  K 4.0  CL 100  CO2 21*  GLUCOSE 140*  BUN 19  CREATININE 1.35*  CALCIUM 10.3   Liver Function Tests: Recent Labs  Lab 05/24/19 0629  AST 11*  ALT 10  ALKPHOS 60  BILITOT 0.5  PROT 8.7*  ALBUMIN 3.9   No results for input(s): LIPASE, AMYLASE in the last 168 hours. No results for input(s): AMMONIA in the last 168 hours. CBC: Recent Labs  Lab 05/24/19 0629  WBC 18.3*  NEUTROABS 14.9*  HGB 7.2*  HCT 27.0*  MCV 74.8*  PLT 386   Cardiac Enzymes: No results  for input(s): CKTOTAL, CKMB, CKMBINDEX, TROPONINI in the last 168 hours.  BNP (last 3 results) No results for input(s): PROBNP in the last 8760 hours. CBG: No results for input(s): GLUCAP in the last 168 hours.  Radiological Exams on Admission: US Venous Img Lower Right (dvt Study)  Result Date: 05/24/2019 CLINICAL DATA:  Right leg pain for 2 days. EXAM: Right LOWER EXTREMITY VENOUS DOPPLER ULTRASOUND TECHNIQUE: Gray-scale sonography with graded compression, as well as color Doppler and duplex ultrasound were performed to evaluate the lower extremity deep venous systems from the level of the common femoral vein and including the common femoral, femoral, profunda femoral, popliteal and calf veins including the posterior tibial, peroneal and gastrocnemius veins when visible. The superficial great saphenous vein was also interrogated. Spectral Doppler was utilized to evaluate flow at rest and with distal augmentation maneuvers in the common femoral, femoral and popliteal veins. COMPARISON:  None. FINDINGS: Contralateral Common Femoral Vein: Noncompressible acute appearing thrombus. Common Femoral Vein: Noncompressible acute appearing thrombus. Saphenofemoral Junction: Noncompressible acute appearing thrombus. Profunda Femoral Vein: Noncompressible acute  appearing thrombus. Femoral Vein: Noncompressible acute appearing thrombus. Popliteal Vein: Noncompressible acute appearing thrombus. Calf Veins: Thrombus evident in the posterior tibial and peroneal veins. Superficial Great Saphenous Vein: Evidence of thrombus. Venous Reflux:  None. Other Findings:  None. IMPRESSION: Diffuse acute deep venous thrombosis extending from the right common femoral vein through the calf veins. Electronically Signed   By: Lajean Manes M.D.   On: 05/24/2019 10:07   Dg Chest Port 1 View  Result Date: 05/24/2019 CLINICAL DATA:  Hypotension EXAM: PORTABLE CHEST 1 VIEW COMPARISON:  February 21 2017 FINDINGS: Airspace opacity is noted in the medial left base. There is cardiomegaly with pulmonary vascularity within normal limits. No adenopathy. No bone lesions. IMPRESSION: Opacity medial left base concerning for consolidation/pneumonia. Lungs elsewhere clear. There is cardiomegaly. No adenopathy evident. Electronically Signed   By: Lowella Grip III M.D.   On: 05/24/2019 09:08   Dg Femur Portable Min 2 Views Right  Result Date: 05/24/2019 CLINICAL DATA:  Reason for exam:Patient states about 2 days ago she was getting out of the shower when she was walking her legs bumped together and she felt a pop in her right leg. Patient complaining of right leg pain. Patient was able to stand and ambulate to the bed. Patient took 2 Ibuprofen at home a few hours ago and states that it did help with pain but pain level is a 5 currently EXAM: RIGHT FEMUR PORTABLE 2 VIEW COMPARISON:  None. FINDINGS: Exam limited by patient's body habitus. Allowing for this, there is no convincing fracture. No bone lesion. Hip and knee joints appear normally aligned. IMPRESSION: Limited exam.  No convincing fracture.  No dislocation. Electronically Signed   By: Lajean Manes M.D.   On: 05/24/2019 08:05   EKG: Personally reviewed. NSR, no acute ST-T wave abnormalities  Assessment/Plan Principal Problem:   Leg DVT  (deep venous thromboembolism), acute, right (HCC) Active Problems:   AKI (acute kidney injury) ([place])   Benign essential HTN   Morbid Obesity    Adjustment disorder with mixed anxiety and depressed mood   Leukocytosis   Right leg pain   Suspected Pulmonary embolus   Community acquired pneumonia   Positive D dimer   Left lower lobe pneumonia (HCC)   Hypotension  1. Acute painful left leg DVT- patient has extensive DVT in the left leg and she has been started on IV heparin for full anticoagulation.  Patient likely  will need to be on lifetime anticoagulation given her immobility and prior venous thromboembolism.  Monitor closely for bleeding complications.  2. Suspected acute PE -given the patient's prior history and the extensive nature of the right leg DVT I am concerned of recurrent PE.  We were unable to accommodate patient in CT scanner at [facility] but can have it done at [facility].  Patient is being admitted to [facility] so that she can have the CTA chest to rule out PE.  The patient has been fully anticoagulated with heparin per pharmacy.  Continue with supplemental oxygen and supportive therapy.  Pt remains hemodynamically stable at this time.  3. Left lower lobe pneumonia- the patient has been started on broad-spectrum IV antibiotics.  She does have a leukocytosis but denies fever.  Her COVID-19 testing has been negative. 4. Leukocytosis-likely secondary to pneumonia-follow CBC with differential. 5. Hypotension-this has resolved with fluid bolus.  Follow. 6. Sepsis secondary to pneumonia- treating as noted above.  Follow clinically.  Lactic acid has trended down to normal. 7. AKI -likely secondary to dehydration-treated with IV fluid boluses as noted above. 8. Right leg pain-secondary to DVT-IV pain management ordered severe pain.  Oral pain management for moderate pain. 9. Microcytic anemia- patient denies black stools but does have a history of bleeding ulcers.  She reports that this  was many years ago.  She reports that she continues to take proton pump inhibitors and sucralfate.  I do worry about occult GI bleed and I have ordered for Hemoccult testing of her stools.  Also will check an anemia panel.  Protonix ordered for GI protection.    DVT Prophylaxis: Heparin infusion Code Status: Full Family Communication: Patient updated at bedside Disposition Plan: Admit to [facility] telemetry  Time spent: 63 minutes  Irwin Brakeman, MD [facility] How to contact the [facility] Attending or Consulting provider [place] or covering provider during after hours [facility], for this patient?  1. Check the care team in [facility] and look for a) attending/consulting [facility] provider listed and b) the [facility] team listed 2. Log into [email or web address] and use [place]'s universal password to access. If you do not have the password, please contact the hospital operator. 3. Locate the [facility] provider you are looking for under [facility] and page to a number that you can be directly reached. 4. If you still have difficulty reaching the provider, please page the [facility] (Director on Call) for the Hospitalists listed on amion for assistance.

## 2019-05-24 NOTE — Progress Notes (Signed)
CRITICAL VALUE ALERT  Critical Value:  Hemoglobin 6.5  Date & Time Notied:  05/24/2019, 2222  Provider Notified: x Blount NP   Orders Received/Actions taken: order received for type and screen and 2 units PRBC.   Also notified that pt has not urinated since this am, and bladder scan showed >65 ml. Will continue to monitor.

## 2019-05-24 NOTE — Progress Notes (Signed)
ANTICOAGULATION CONSULT NOTE - Initial Consult  Pharmacy Consult for heparin gtt  Indication: DVT  Allergies  Allergen Reactions  . Aspirin     Due to hx of stomach ulcers    Patient Measurements: Height: 5\' 5"  (165.1 cm) Weight: (!) 514 lb 12.4 oz (233.5 kg) IBW/kg (Calculated) : 57 Heparin Dosing Weight:     Vital Signs: Temp: 98.3 F (36.8 C) (07/11 0603) Temp Source: Oral (07/11 0603) BP: 97/66 (07/11 1230) Pulse Rate: 79 (07/11 1230)  Labs: Recent Labs    05/24/19 0629  HGB 7.2*  HCT 27.0*  PLT 386  APTT 26  LABPROT 13.8  INR 1.1  CREATININE 1.35*    Estimated Creatinine Clearance: 107.1 mL/min (A) (by C-G formula based on SCr of 1.35 mg/dL (H)).   Medical History: Past Medical History:  Diagnosis Date  . Anemia   . Anxiety   . Asthma   . Gastric ulcer   . Headache   . Hiatal hernia   . Hypertension   . PE (pulmonary embolism)   . Tachycardia     Medications:  (Not in a hospital admission)  Scheduled:  . heparin  4,000 Units Intravenous Once   Infusions:  . ceFEPime (MAXIPIME) IV    . heparin    . metronidazole 500 mg (05/24/19 1238)  . vancomycin (VANCOCIN) 1500 mg/574mL IVPB (Vial-Mate [employer])    . vancomycin     PRN:  Anti-infectives (From admission, onward)   Start     Dose/Rate Route Frequency Ordered Stop   05/24/19 1600  Vancomycin (VANCOCIN) 1,500 mg in sodium chloride 0.9 % 500 mL IVPB     1,500 mg 250 mL/hr over 120 Minutes Intravenous Every 8 hours 05/24/19 0855     05/24/19 1600  ceFEPIme (MAXIPIME) 2 g in sodium chloride 0.9 % 100 mL IVPB     2 g 200 mL/hr over 30 Minutes Intravenous Every 8 hours 05/24/19 0855     05/24/19 0900  vancomycin (VANCOCIN) IVPB 1000 mg/200 mL premix     1,000 mg 200 mL/hr over 60 Minutes Intravenous  Once 05/24/19 0854     05/24/19 0830  ceFEPIme (MAXIPIME) 2 g in sodium chloride 0.9 % 100 mL IVPB     2 g 200 mL/hr over 30 Minutes Intravenous  Once 05/24/19 0822 05/24/19 0941   05/24/19  0830  metroNIDAZOLE (FLAGYL) IVPB 500 mg     500 mg 100 mL/hr over 60 Minutes Intravenous  Once 05/24/19 0822     05/24/19 0830  vancomycin (VANCOCIN) IVPB 1000 mg/200 mL premix     1,000 mg 200 mL/hr over 60 Minutes Intravenous  Once 05/24/19 4403 05/24/19 4742      Assessment: Megan Ortiz a 43 y.o. female requires anticoagulation with a heparin iv infusion for the indication of  DVT. Heparin gtt will be started following pharmacy protocol per pharmacy consult. Patient is not on previous oral anticoagulant that will require aPTT/HL correlation before transitioning to only HL monitoring.   Per Dr Rogene Houston, start patient on heparin 4000 unit bolus and 2000 units/hr adjust from there, abnormal body composition due to being over 500lbs. Patient also has possible PE per MD, patient likely being transferred to [facility] due to our scanner not being large enough for her.   Goal of Therapy:  Heparin level 0.3-0.7 units/ml Monitor platelets by anticoagulation protocol: Yes   Plan:  Give 2000 units bolus x 1 Start heparin infusion at 2000 units/hr Check anti-Xa level in 6 hours and  daily while on heparin Continue to monitor H&H and platelets  Heparin level to be drawn in 6 hours [name] [name] 05/24/2019,12:58 PM

## 2019-05-24 NOTE — ED Notes (Signed)
ED TO INPATIENT HANDOFF REPORT  ED Nurse Name and Phone #: Gaspar Bidding [phone]  S Name/Age/Gender Megan Ortiz 43 y.o. female Room/Bed: APA19/APA19  Code Status   Code Status: Prior  Home/SNF/Other Home Patient oriented to: self, place, time and situation Is this baseline? Yes   Triage Complete: Triage complete  Chief Complaint leg pain  Triage Note Patient states about 2 days ago she was getting out of the shower when she was walking her legs bumped together and she felt a pop in her right leg. Patient complaining of right leg pain. Patient was able to stand and ambulate to the bed. Patient took 2 Ibuprofen at home a few hours ago and states that it did help with pain but pain level is a 5 currently.   Allergies Allergies  Allergen Reactions  . Aspirin     Due to hx of stomach ulcers    Level of Care/Admitting Diagnosis ED Disposition    ED Disposition Condition [facility] Area: [place] [[id number]]  Level of Care: Telemetry Medical [104]  Covid Evaluation: Confirmed COVID Negative  Diagnosis: Leg DVT (deep venous thromboembolism), acute, right [facility]) [[id number]]  Admitting Physician: Murlean Iba [[id number]]  Attending Physician: Murlean Iba [[id number]]  Estimated length of stay: past midnight tomorrow  Certification:: I certify this patient will need inpatient services for at least 2 midnights  PT Class (Do Not Modify): Inpatient [101]  PT Acc Code (Do Not Modify): Private [1]       B Medical/Surgery History Past Medical History:  Diagnosis Date  . Anemia   . Anxiety   . Asthma   . Gastric ulcer   . Headache   . Hiatal hernia   . Hypertension   . PE (pulmonary embolism)   . Tachycardia    Past Surgical History:  Procedure Laterality Date  . CESAREAN SECTION    . CHOLECYSTECTOMY    . INCISION AND DRAINAGE ABSCESS Left 02/16/2017   Procedure: DEBRIDEMENT LEFT BUTTOCK ABSCESS;  Surgeon: Fanny Skates, MD;  Location:  [facility];  Service: General;  Laterality: Left;  . IRRIGATION AND DEBRIDEMENT BUTTOCKS Left 02/19/2017   Procedure: DEBRIDEMENT OF GLUTEAL WOUND;  Surgeon: Rolm Bookbinder, MD;  Location: [facility];  Service: General;  Laterality: Left;     A IV Location/Drains/Wounds Patient Lines/Drains/Airways Status   Active Line/Drains/Airways    Name:   Placement date:   Placement time:   Site:   Days:   Peripheral IV 05/24/19 Right Arm   05/24/19    0635    Arm   less than 1   Peripheral IV 05/24/19 Left Antecubital   05/24/19    0841    Antecubital   less than 1   External Urinary Catheter   05/24/19    1017    -   less than 1   Wound / Incision (Open or Dehisced) 02/16/17 Non-pressure wound Buttocks Left   02/16/17    0400    Buttocks   827          Intake/Output Last 24 hours  Intake/Output Summary (Last 24 hours) at 05/24/2019 1604 Last data filed at 05/24/2019 1428 Gross per 24 hour  Intake 2477.94 ml  Output -  Net 2477.94 ml    Labs/Imaging Results for orders placed or performed during the hospital encounter of 05/24/19 (from the past 48 hour(s))  Comprehensive metabolic panel     Status: Abnormal   Collection Time: 05/24/19  6:29  AM  Result Value Ref Range   Sodium 133 (L) 135 - 145 mmol/L   Potassium 4.0 3.5 - 5.1 mmol/L   Chloride 100 98 - 111 mmol/L   CO2 21 (L) 22 - 32 mmol/L   Glucose, Bld 140 (H) 70 - 99 mg/dL   BUN 19 6 - 20 mg/dL   Creatinine, Ser 1.35 (H) 0.44 - 1.00 mg/dL   Calcium 10.3 8.9 - 10.3 mg/dL   Total Protein 8.7 (H) 6.5 - 8.1 g/dL   Albumin 3.9 3.5 - 5.0 g/dL   AST 11 (L) 15 - 41 U/L   ALT 10 0 - 44 U/L   Alkaline Phosphatase 60 38 - 126 U/L   Total Bilirubin 0.5 0.3 - 1.2 mg/dL   GFR calc non Af Amer 48 (L) >60 mL/min   GFR calc Af Amer 55 (L) >60 mL/min   Anion gap 12 5 - 15    Comment: Performed at [facility], [address], [place], [place] [address]  CBC with Differential     Status: Abnormal   Collection Time: 05/24/19  6:29 AM  Result Value Ref  Range   WBC 18.3 (H) 4.0 - 10.5 K/uL   RBC 3.61 (L) 3.87 - 5.11 MIL/uL   Hemoglobin 7.2 (L) 12.0 - 15.0 g/dL    Comment: Reticulocyte Hemoglobin testing may be clinically indicated, consider ordering this additional test [id number]    HCT 27.0 (L) 36.0 - 46.0 %   MCV 74.8 (L) 80.0 - 100.0 fL   MCH 19.9 (L) 26.0 - 34.0 pg   MCHC 26.7 (L) 30.0 - 36.0 g/dL   RDW 20.7 (H) 11.5 - 15.5 %   Platelets 386 150 - 400 K/uL   nRBC 0.4 (H) 0.0 - 0.2 %   Neutrophils Relative % 81 %   Neutro Abs 14.9 (H) 1.7 - 7.7 K/uL   Lymphocytes Relative 10 %   Lymphs Abs 1.9 0.7 - 4.0 K/uL   Monocytes Relative 5 %   Monocytes Absolute 0.8 0.1 - 1.0 K/uL   Eosinophils Relative 2 %   Eosinophils Absolute 0.4 0.0 - 0.5 K/uL   Basophils Relative 1 %   Basophils Absolute 0.1 0.0 - 0.1 K/uL   Immature Granulocytes 1 %   Abs Immature Granulocytes 0.21 (H) 0.00 - 0.07 K/uL    Comment: Performed at [facility], [address], [place], [place] [address]  D-dimer, quantitative     Status: Abnormal   Collection Time: 05/24/19  6:29 AM  Result Value Ref Range   D-Dimer, Quant 17.10 (H) 0.00 - 0.50 ug/mL-FEU    Comment: (NOTE) At the manufacturer cut-off of 0.50 ug/mL FEU, this assay has been documented to exclude PE with a sensitivity and negative predictive value of 97 to 99%.  At this time, this assay has not been approved by the FDA to exclude DVT/VTE. Results should be correlated with clinical presentation. Performed at [facility], [address], [place], [place] [address]   Protime-INR     Status: None   Collection Time: 05/24/19  6:29 AM  Result Value Ref Range   Prothrombin Time 13.8 11.4 - 15.2 seconds   INR 1.1 0.8 - 1.2    Comment: (NOTE) INR goal varies based on device and disease states. Performed at [facility], [address] [place], [place] [address]   APTT     Status: None   Collection Time: 05/24/19  6:29 AM  Result Value Ref Range   aPTT 26  24 - 36 seconds    Comment: Performed at  [facility], [address], [place], [place] [address]  Lactic acid, plasma     Status: Abnormal   Collection Time: 05/24/19  6:44 AM  Result Value Ref Range   Lactic Acid, Venous 2.9 (HH) 0.5 - 1.9 mmol/L    Comment: CRITICAL RESULT CALLED TO, READ BACK BY AND VERIFIED WITH: B.[email or web address]  BY MATTHEWS,B 7.11.2020 Performed at [facility], [address] [place], [place] [address]   Lactic acid, plasma     Status: None   Collection Time: 05/24/19  8:23 AM  Result Value Ref Range   Lactic Acid, Venous 1.8 0.5 - 1.9 mmol/L    Comment: Performed at [facility], [address] [place], [place] [address]  Blood Culture (routine x 2)     Status: None (Preliminary result)   Collection Time: 05/24/19  8:35 AM   Specimen: Left Antecubital; Blood  Result Value Ref Range   Specimen Description LEFT ANTECUBITAL Blood Culture adequate volume    Special Requests      BOTTLES DRAWN AEROBIC AND ANAEROBIC Performed at [facility], [address], [place], [place] [address]    Culture PENDING    Report Status PENDING   SARS Coronavirus 2 (CEPHEID - Performed in [employer] hospital lab), Hosp Order     Status: None   Collection Time: 05/24/19  8:35 AM   Specimen: Nasopharyngeal Swab  Result Value Ref Range   SARS Coronavirus 2 NEGATIVE NEGATIVE    Comment: (NOTE) If result is NEGATIVE SARS-CoV-2 target nucleic acids are NOT DETECTED. The SARS-CoV-2 RNA is generally detectable in upper and lower  respiratory specimens during the acute phase of infection. The lowest  concentration of SARS-CoV-2 viral copies this assay can detect is 250  copies / mL. A negative result does not preclude SARS-CoV-2 infection  and should not be used as the sole basis for treatment or other  patient management decisions.  A negative result may occur with  improper specimen collection / handling, submission of specimen other  than nasopharyngeal swab, presence of viral mutation(s) within the  areas targeted by this  assay, and inadequate number of viral copies  (<250 copies / mL). A negative result must be combined with clinical  observations, patient history, and epidemiological information. If result is POSITIVE SARS-CoV-2 target nucleic acids are DETECTED. The SARS-CoV-2 RNA is generally detectable in upper and lower  respiratory specimens dur ing the acute phase of infection.  Positive  results are indicative of active infection with SARS-CoV-2.  Clinical  correlation with patient history and other diagnostic information is  necessary to determine patient infection status.  Positive results do  not rule out bacterial infection or co-infection with other viruses. If result is PRESUMPTIVE POSTIVE SARS-CoV-2 nucleic acids MAY BE PRESENT.   A presumptive positive result was obtained on the submitted specimen  and confirmed on repeat testing.  While 2019 novel coronavirus  (SARS-CoV-2) nucleic acids may be present in the submitted sample  additional confirmatory testing may be necessary for epidemiological  and / or clinical management purposes  to differentiate between  SARS-CoV-2 and other Sarbecovirus currently known to infect humans.  If clinically indicated additional testing with an alternate test  methodology [phone]) is advised. The SARS-CoV-2 RNA is generally  detectable in upper and lower respiratory sp ecimens during the acute  phase of infection. The expected result is Negative. Fact Sheet for Patients:  StrictlyIdeas.no Fact Sheet for Healthcare Providers: BankingDealers.co.za  This test is not yet approved or cleared by the [place] and has been authorized for detection and/or diagnosis of SARS-CoV-2 by FDA under an Emergency Use Authorization (EUA).  This EUA will remain in effect (meaning this test can be used) for the duration of the COVID-19 declaration under Section 564(b)(1) of the Act, 21 U.S.C. section 360bbb-3(b)(1),  unless the authorization is terminated or revoked sooner. Performed at [facility], [address], [place], [place] [address]   Blood Culture (routine x 2)     Status: None (Preliminary result)   Collection Time: 05/24/19  8:36 AM   Specimen: BLOOD LEFT HAND  Result Value Ref Range   Specimen Description BLOOD LEFT HAND Blood Culture adequate volume    Special Requests      BOTTLES DRAWN AEROBIC AND ANAEROBIC Performed at [facility], [address] [place], [place] [address]    Culture PENDING    Report Status PENDING    US Venous Img Lower Right (dvt Study)  Result Date: 05/24/2019 CLINICAL DATA:  Right leg pain for 2 days. EXAM: Right LOWER EXTREMITY VENOUS DOPPLER ULTRASOUND TECHNIQUE: Gray-scale sonography with graded compression, as well as color Doppler and duplex ultrasound were performed to evaluate the lower extremity deep venous systems from the level of the common femoral vein and including the common femoral, femoral, profunda femoral, popliteal and calf veins including the posterior tibial, peroneal and gastrocnemius veins when visible. The superficial great saphenous vein was also interrogated. Spectral Doppler was utilized to evaluate flow at rest and with distal augmentation maneuvers in the common femoral, femoral and popliteal veins. COMPARISON:  None. FINDINGS: Contralateral Common Femoral Vein: Noncompressible acute appearing thrombus. Common Femoral Vein: Noncompressible acute appearing thrombus. Saphenofemoral Junction: Noncompressible acute appearing thrombus. Profunda Femoral Vein: Noncompressible acute appearing thrombus. Femoral Vein: Noncompressible acute appearing thrombus. Popliteal Vein: Noncompressible acute appearing thrombus. Calf Veins: Thrombus evident in the posterior tibial and peroneal veins. Superficial Great Saphenous Vein: Evidence of thrombus. Venous Reflux:  None. Other Findings:  None. IMPRESSION: Diffuse acute deep venous thrombosis extending from the  right common femoral vein through the calf veins. Electronically Signed   By: Lajean Manes M.D.   On: 05/24/2019 10:07   Dg Chest Port 1 View  Result Date: 05/24/2019 CLINICAL DATA:  Hypotension EXAM: PORTABLE CHEST 1 VIEW COMPARISON:  February 21 2017 FINDINGS: Airspace opacity is noted in the medial left base. There is cardiomegaly with pulmonary vascularity within normal limits. No adenopathy. No bone lesions. IMPRESSION: Opacity medial left base concerning for consolidation/pneumonia. Lungs elsewhere clear. There is cardiomegaly. No adenopathy evident. Electronically Signed   By: Lowella Grip III M.D.   On: 05/24/2019 09:08   Dg Femur Portable Min 2 Views Right  Result Date: 05/24/2019 CLINICAL DATA:  Reason for exam:Patient states about 2 days ago she was getting out of the shower when she was walking her legs bumped together and she felt a pop in her right leg. Patient complaining of right leg pain. Patient was able to stand and ambulate to the bed. Patient took 2 Ibuprofen at home a few hours ago and states that it did help with pain but pain level is a 5 currently EXAM: RIGHT FEMUR PORTABLE 2 VIEW COMPARISON:  None. FINDINGS: Exam limited by patient's body habitus. Allowing for this, there is no convincing fracture. No bone lesion. Hip and knee joints appear normally aligned. IMPRESSION: Limited exam.  No convincing fracture.  No dislocation. Electronically Signed   By: Shanon Brow  Ormond M.D.   On: 05/24/2019 08:05    Pending Labs Unresulted Labs (From admission, onward)    Start     Ordered   05/25/19 0500  CBC  Daily,   R     05/24/19 1258   05/25/19 0500  Heparin level (unfractionated)  Daily,   R     05/24/19 1258   05/24/19 1900  Heparin level (unfractionated)  Once-Timed,   STAT     05/24/19 1257   05/24/19 0820  Urinalysis, Routine w reflex microscopic  ONCE - STAT,   STAT     05/24/19 0822   05/24/19 0820  Urine culture  ONCE - STAT,   STAT     05/24/19 0822   Signed and Held   Rapid urine drug screen (hospital performed)  ONCE - STAT,   R     Signed and Held   Signed and Held  HIV antibody (Routine Testing)  Add-on,   R     Signed and Held   Signed and Held  Comprehensive metabolic panel  Daily,   R     Signed and Held   Signed and Held  Magnesium  Daily,   R     Signed and Held   Signed and Held  CBC WITH DIFFERENTIAL  Daily,   R     Signed and Held   Signed and Held  Occult blood card to lab, stool RN will collect  As needed,   R    Question:  Specimen to be collected by?  Answer:  RN will collect   Signed and Held          Vitals/Pain Today's Vitals   05/24/19 1300 05/24/19 1330 05/24/19 1415 05/24/19 1430  BP: 111/62 110/69  (!) 100/58  Pulse: 80 84 86 92  Resp: 18 16 (!) 21 20  Temp:      TempSrc:      SpO2: 93% 92% 94% 97%  Weight:      Height:      PainSc:        Isolation Precautions No active isolations  Medications Medications  Vancomycin (VANCOCIN) 1,500 mg in sodium chloride 0.9 % 500 mL IVPB (has no administration in time range)  ceFEPIme (MAXIPIME) 2 g in sodium chloride 0.9 % 100 mL IVPB (has no administration in time range)  heparin ADULT infusion 100 units/mL (25000 units/275mL sodium chloride 0.45%) (2,000 Units/hr Intravenous New Bag/Given 05/24/19 1336)  fentaNYL (SUBLIMAZE) injection 100 mcg (100 mcg Intravenous Given 05/24/19 1324)  sodium chloride 0.9 % bolus 1,000 mL (0 mLs Intravenous Stopped 05/24/19 0907)  sodium chloride 0.9 % bolus 1,000 mL (0 mLs Intravenous Stopped 05/24/19 1240)  fentaNYL (SUBLIMAZE) injection 25 mcg (25 mcg Intravenous Given 05/24/19 0912)  ceFEPIme (MAXIPIME) 2 g in sodium chloride 0.9 % 100 mL IVPB (0 g Intravenous Stopped 05/24/19 0941)  metroNIDAZOLE (FLAGYL) IVPB 500 mg (0 mg Intravenous Stopped 05/24/19 1338)  vancomycin (VANCOCIN) IVPB 1000 mg/200 mL premix ( Intravenous Stopped 05/24/19 0936)  vancomycin (VANCOCIN) IVPB 1000 mg/200 mL premix (0 mg Intravenous Stopped 05/24/19 1428)  heparin  injection 4,000 Units (4,000 Units Intravenous Given 05/24/19 1337)    Mobility non-ambulatory Low fall risk   Focused Assessments Cardiac Assessment Handoff:  Cardiac Rhythm: Normal sinus rhythm Lab Results  Component Value Date   TROPONINI 0.11 (HH) 02/16/2017   Lab Results  Component Value Date   DDIMER 17.10 (H) 05/24/2019   Does the Patient currently have chest pain? No  ,  Pulmonary Assessment Handoff:  Lung sounds:   O2 Device: Room Air        R Recommendations: See Admitting Provider Note  Report given to:   Additional Notes: Patient obese and will need extra help when being moved.

## 2019-05-25 ENCOUNTER — Inpatient Hospital Stay (HOSPITAL_COMMUNITY): Payer: Self-pay | Admitting: Anesthesiology

## 2019-05-25 ENCOUNTER — Encounter (HOSPITAL_COMMUNITY)
Admission: EM | Disposition: A | Discharge status: Home Health | Payer: Self-pay | Source: Home / Self Care | Attending: Internal Medicine

## 2019-05-25 ENCOUNTER — Inpatient Hospital Stay (HOSPITAL_COMMUNITY): Payer: Self-pay

## 2019-05-25 ENCOUNTER — Encounter (HOSPITAL_COMMUNITY): Payer: Self-pay | Admitting: Radiology

## 2019-05-25 DIAGNOSIS — N179 Acute kidney failure, unspecified: Secondary | ICD-10-CM

## 2019-05-25 DIAGNOSIS — I82409 Acute embolism and thrombosis of unspecified deep veins of unspecified lower extremity: Secondary | ICD-10-CM

## 2019-05-25 DIAGNOSIS — D649 Anemia, unspecified: Secondary | ICD-10-CM

## 2019-05-25 DIAGNOSIS — R12 Heartburn: Secondary | ICD-10-CM

## 2019-05-25 DIAGNOSIS — I959 Hypotension, unspecified: Secondary | ICD-10-CM

## 2019-05-25 DIAGNOSIS — K922 Gastrointestinal hemorrhage, unspecified: Secondary | ICD-10-CM

## 2019-05-25 DIAGNOSIS — K449 Diaphragmatic hernia without obstruction or gangrene: Secondary | ICD-10-CM

## 2019-05-25 DIAGNOSIS — R933 Abnormal findings on diagnostic imaging of other parts of digestive tract: Secondary | ICD-10-CM

## 2019-05-25 DIAGNOSIS — D638 Anemia in other chronic diseases classified elsewhere: Secondary | ICD-10-CM

## 2019-05-25 DIAGNOSIS — I2602 Saddle embolus of pulmonary artery with acute cor pulmonale: Secondary | ICD-10-CM

## 2019-05-25 DIAGNOSIS — I82411 Acute embolism and thrombosis of right femoral vein: Principal | ICD-10-CM

## 2019-05-25 HISTORY — PX: ESOPHAGOGASTRODUODENOSCOPY (EGD) WITH PROPOFOL: SHX5813

## 2019-05-25 LAB — URINALYSIS, ROUTINE W REFLEX MICROSCOPIC
Bilirubin Urine: NEGATIVE
Glucose, UA: NEGATIVE mg/dL
Hgb urine dipstick: NEGATIVE
Ketones, ur: 5 mg/dL — AB
Leukocytes,Ua: NEGATIVE
Nitrite: NEGATIVE
Protein, ur: 30 mg/dL — AB
Specific Gravity, Urine: >1.046 — ABNORMAL HIGH (ref 1.005–1.030)
pH: 5 (ref 5.0–8.0)

## 2019-05-25 LAB — CBC WITH DIFFERENTIAL/PLATELET
Abs Immature Granulocytes: 0.15 10*3/uL — ABNORMAL HIGH (ref 0.00–0.07)
Basophils Absolute: 0.1 10*3/uL (ref 0.0–0.1)
Basophils Relative: 1 %
Eosinophils Absolute: 0.3 10*3/uL (ref 0.0–0.5)
Eosinophils Relative: 2 %
HCT: 25.9 % — ABNORMAL LOW (ref 36.0–46.0)
Hemoglobin: 7.1 g/dL — ABNORMAL LOW (ref 12.0–15.0)
Immature Granulocytes: 1 %
Lymphocytes Relative: 14 %
Lymphs Abs: 1.7 10*3/uL (ref 0.7–4.0)
MCH: 20.2 pg — ABNORMAL LOW (ref 26.0–34.0)
MCHC: 27.4 g/dL — ABNORMAL LOW (ref 30.0–36.0)
MCV: 73.8 fL — ABNORMAL LOW (ref 80.0–100.0)
Monocytes Absolute: 0.8 10*3/uL (ref 0.1–1.0)
Monocytes Relative: 6 %
Neutro Abs: 9 10*3/uL — ABNORMAL HIGH (ref 1.7–7.7)
Neutrophils Relative %: 76 %
Platelets: 295 10*3/uL (ref 150–400)
RBC: 3.51 MIL/uL — ABNORMAL LOW (ref 3.87–5.11)
RDW: 21.2 % — ABNORMAL HIGH (ref 11.5–15.5)
WBC: 11.9 10*3/uL — ABNORMAL HIGH (ref 4.0–10.5)
nRBC: 0.3 % — ABNORMAL HIGH (ref 0.0–0.2)

## 2019-05-25 LAB — RAPID URINE DRUG SCREEN, HOSP PERFORMED
Amphetamines: NOT DETECTED
Barbiturates: NOT DETECTED
Benzodiazepines: NOT DETECTED
Cocaine: NOT DETECTED
Opiates: NOT DETECTED
Tetrahydrocannabinol: NOT DETECTED

## 2019-05-25 LAB — COMPREHENSIVE METABOLIC PANEL
ALT: 11 U/L (ref 0–44)
AST: 14 U/L — ABNORMAL LOW (ref 15–41)
Albumin: 3.2 g/dL — ABNORMAL LOW (ref 3.5–5.0)
Alkaline Phosphatase: 67 U/L (ref 38–126)
Anion gap: 12 (ref 5–15)
BUN: 26 mg/dL — ABNORMAL HIGH (ref 6–20)
CO2: 19 mmol/L — ABNORMAL LOW (ref 22–32)
Calcium: 10.1 mg/dL (ref 8.9–10.3)
Chloride: 102 mmol/L (ref 98–111)
Creatinine, Ser: 2.07 mg/dL — ABNORMAL HIGH (ref 0.44–1.00)
GFR calc Af Amer: 33 mL/min — ABNORMAL LOW (ref >60)
GFR calc non Af Amer: 28 mL/min — ABNORMAL LOW (ref >60)
Glucose, Bld: 155 mg/dL — ABNORMAL HIGH (ref 70–99)
Potassium: 4.4 mmol/L (ref 3.5–5.1)
Sodium: 133 mmol/L — ABNORMAL LOW (ref 135–145)
Total Bilirubin: 1 mg/dL (ref 0.3–1.2)
Total Protein: 7.4 g/dL (ref 6.5–8.1)

## 2019-05-25 LAB — PREPARE RBC (CROSSMATCH)

## 2019-05-25 LAB — MAGNESIUM: Magnesium: 1.7 mg/dL (ref 1.7–2.4)

## 2019-05-25 LAB — CREATININE, URINE, RANDOM: Creatinine, Urine: 178.46 mg/dL

## 2019-05-25 LAB — SODIUM, URINE, RANDOM: Sodium, Ur: 48 mmol/L

## 2019-05-25 LAB — HEMOGLOBIN AND HEMATOCRIT, BLOOD
HCT: 29.2 % — ABNORMAL LOW (ref 36.0–46.0)
Hemoglobin: 8.3 g/dL — ABNORMAL LOW (ref 12.0–15.0)

## 2019-05-25 LAB — HIV ANTIBODY (ROUTINE TESTING W REFLEX): HIV Screen 4th Generation wRfx: NONREACTIVE

## 2019-05-25 SURGERY — ESOPHAGOGASTRODUODENOSCOPY (EGD) WITH PROPOFOL
Anesthesia: General

## 2019-05-25 MED ORDER — PROPOFOL 10 MG/ML IV BOLUS
INTRAVENOUS | Status: DC | PRN
  Administered 2019-05-25: 120 mg via INTRAVENOUS

## 2019-05-25 MED ORDER — PANTOPRAZOLE SODIUM 40 MG PO TBEC
40.0000 mg | DELAYED_RELEASE_TABLET | Freq: Two times a day (BID) | ORAL | Status: DC
  Administered 2019-05-25 – 2019-06-03 (×15): 40 mg via ORAL
  Filled 2019-05-25 (×14): qty 1

## 2019-05-25 MED ORDER — PERFLUTREN LIPID MICROSPHERE
1.0000 mL | INTRAVENOUS | Status: AC | PRN
  Administered 2019-05-25: 2 mL via INTRAVENOUS
  Filled 2019-05-25: qty 10

## 2019-05-25 MED ORDER — FUROSEMIDE 10 MG/ML IJ SOLN
20.0000 mg | Freq: Once | INTRAMUSCULAR | Status: AC
  Administered 2019-05-25: 20 mg via INTRAVENOUS
  Filled 2019-05-25: qty 2

## 2019-05-25 MED ORDER — SODIUM CHLORIDE 0.9% IV SOLUTION
Freq: Once | INTRAVENOUS | Status: AC
  Administered 2019-05-25: 08:00:00 via INTRAVENOUS

## 2019-05-25 MED ORDER — SODIUM CHLORIDE 0.9% FLUSH: 10.0000 mL | INTRAVENOUS | Status: DC | PRN

## 2019-05-25 MED ORDER — SODIUM CHLORIDE 0.9 % IV SOLN
80.0000 mg | Freq: Once | INTRAVENOUS | Status: AC
  Administered 2019-05-25: 80 mg via INTRAVENOUS
  Filled 2019-05-25: qty 80

## 2019-05-25 MED ORDER — SUCCINYLCHOLINE CHLORIDE 20 MG/ML IJ SOLN
INTRAMUSCULAR | Status: DC | PRN
  Administered 2019-05-25: 120 mg via INTRAVENOUS

## 2019-05-25 MED ORDER — PERFLUTREN LIPID MICROSPHERE
INTRAVENOUS | Status: AC
  Filled 2019-05-25: qty 10

## 2019-05-25 MED ORDER — DIPHENHYDRAMINE HCL 25 MG PO CAPS
25.0000 mg | ORAL_CAPSULE | Freq: Once | ORAL | Status: AC
  Administered 2019-05-25: 25 mg via ORAL
  Filled 2019-05-25: qty 1

## 2019-05-25 MED ORDER — LACTATED RINGERS IV SOLN
INTRAVENOUS | Status: DC
  Administered 2019-05-25: 13:00:00 via INTRAVENOUS

## 2019-05-25 MED ORDER — SODIUM CHLORIDE 0.9 % IV SOLN
8.0000 mg/h | INTRAVENOUS | Status: DC
  Administered 2019-05-25: 8 mg/h via INTRAVENOUS
  Filled 2019-05-25: qty 80

## 2019-05-25 MED ORDER — LIDOCAINE 2% (20 MG/ML) 5 ML SYRINGE
INTRAMUSCULAR | Status: DC | PRN
  Administered 2019-05-25: 50 mg via INTRAVENOUS

## 2019-05-25 MED ORDER — ACETAMINOPHEN 325 MG PO TABS
650.0000 mg | ORAL_TABLET | Freq: Once | ORAL | Status: AC
  Administered 2019-05-25: 650 mg via ORAL
  Filled 2019-05-25: qty 2

## 2019-05-25 MED ORDER — SODIUM CHLORIDE 0.9 % IV SOLN: INTRAVENOUS | Status: DC

## 2019-05-25 MED ORDER — PANTOPRAZOLE SODIUM 40 MG IV SOLR: 40.0000 mg | Freq: Two times a day (BID) | INTRAVENOUS | Status: DC

## 2019-05-25 MED ORDER — HEPARIN (PORCINE) 25000 UT/250ML-% IV SOLN
2800.0000 [IU]/h | INTRAVENOUS | Status: DC
  Administered 2019-05-25: 1600 [IU]/h via INTRAVENOUS
  Administered 2019-05-26: 2350 [IU]/h via INTRAVENOUS
  Administered 2019-05-26: 2000 [IU]/h via INTRAVENOUS
  Administered 2019-05-27 – 2019-05-30 (×9): 2650 [IU]/h via INTRAVENOUS
  Administered 2019-05-31 – 2019-06-03 (×8): 2800 [IU]/h via INTRAVENOUS
  Filled 2019-05-25 (×22): qty 250

## 2019-05-25 MED ORDER — SODIUM CHLORIDE 0.9% FLUSH
10.0000 mL | Freq: Two times a day (BID) | INTRAVENOUS | Status: DC
  Administered 2019-05-25 (×2): 20 mL
  Administered 2019-05-26 – 2019-05-27 (×3): 10 mL
  Administered 2019-05-27: 20 mL
  Administered 2019-05-28 – 2019-06-01 (×5): 10 mL

## 2019-05-25 MED ORDER — MAGNESIUM SULFATE 4 GM/100ML IV SOLN
4.0000 g | Freq: Once | INTRAVENOUS | Status: AC
  Administered 2019-05-25: 4 g via INTRAVENOUS
  Filled 2019-05-25: qty 100

## 2019-05-25 SURGICAL SUPPLY — 15 items

## 2019-05-25 NOTE — Progress Notes (Signed)
PROGRESS NOTE    Megan Ortiz  ATF:[id number] DOB: 07/08/1976 DOA: 05/24/2019 PCP: Larene Beach, MD    Brief Narrative:  Patient is a pleasant 43 year old female with morbid obesity, asthma, hypertension, hiatal hernia, bleeding gastric ulcer over 10 years ago while living in [place], history of PE approximately 10 years ago per patient was on anticoagulation that has subsequently been discontinued, history of necrotizing fasciitis who presented to the ED with complaints of 2 days of right leg pain.  Work-up consistent with extensive right lower extremity DVT from right common femoral vein down to the calf veins.  Patient noted to have a leukocytosis of 18.3, hemoglobin of 7.2.  Patient was sent for CT angiogram at [facility] however due to her size unable to fit into the [place] subsequently sent to [facility].  CT angiogram chest which was done was negative for PE however did show a large hiatal hernia with dependent density within the hiatal hernia suggestive of active hemorrhage.  Patient noted to have a prior history of IVC filter placement.  Patient initially placed on heparin however due to concerns for GI bleed heparin discontinued.  Patient noted to have a hemoglobin drop as low as 6.5 and being transfused 2 units of packed red blood cells.  Patient also placed empirically on IV antibiotics due to concern for pneumonia noted on chest x-ray on presentation to the ED.  Patient placed on octreotide drip, started on Protonix drip and GI consulted for further evaluation and management.     Assessment & Plan:   Principal Problem:   Leg DVT (deep venous thromboembolism), acute, right (HCC) Active Problems:   AKI (acute kidney injury) ([place])   Benign essential HTN   Morbid Obesity    Adjustment disorder with mixed anxiety and depressed mood   Leukocytosis   Right leg pain   Suspected Pulmonary embolus   Community acquired pneumonia   Positive D dimer   Left lower lobe  pneumonia (HCC)   Hypotension  1 acute right extensive DVT Patient noted to have presented with right lower extremity swelling and pain, Doppler ultrasound consistent with an extensive DVT extending from the right common femoral vein down to the calf veins.  CT angiogram chest negative for PE however did show a large hiatal hernia with concerns for gastric hemorrhage.  Patient initially placed on IV heparin which has subsequently been discontinued.  Patient noted to have IVC filter placed.  GI consulted for evaluation of concern for gastric hemorrhage and patient with history of gastric ulcers.  Hold off on anticoagulation.  Follow.  2.  Anemia/??  GI hemorrhage Patient noted to be anemic on admission with hemoglobin of 6.5.  CT angiogram of chest which was done for evaluation for PE noted large hiatal hernia with dependent density within the hiatal hernia suggestive of active hemorrhage.  Patient noted to have hemoglobin ranging from 7.7-8.1 from labs in April 2018.  Heparin discontinued due to concern for GI hemorrhage.  GI consulted patient seen in consultation by Dr. Ardis Hughs who is planning on upper endoscopy later on today.  Continue octreotide drip.  Patient has been started on a Protonix drip which we will continue for now.  Keep n.p.o.  Patient currently receiving second unit of packed red blood cells.  Check CBC 2 hours posttransfusion.  Appreciate GI input and recommendations.  3.  Hypotension Patient noted to be hypotensive on admission.  Questionable etiology.  Concern for GI hemorrhage.  CT angiogram of chest which  was done was negative for PE however concerning for possible gastric hemorrhage noted within a large hiatal hernia.  Blood pressure improving with hydration and transfusion.  Place on gentle hydration.  Follow.  4.  Acute renal failure Likely secondary to a prerenal azotemia.  Creatinine at 2.07 from 1.35 on admission.  Patient noted to have some hypotension.  Check a fractional  excretion of sodium.  Check a renal ultrasound.  Patient being transfused packed red blood cells.  Follow.  5. ??  Left lower lobe pneumonia Initially noted on chest x-ray on admission.  CT angiogram chest which was done was negative for any acute infiltrate and PE.  Leukocytosis trending down.  Patient with no respiratory symptoms.  COVID-19 negative.  Discontinue IV vancomycin.  Continue IV cefepime pending blood culture results.   6.  Leukocytosis Questionable etiology.  Likely reactive leukocytosis.  Initial chest x-ray concerning for left lower lobe pneumonia however CT angiogram chest which was done was negative for any acute infiltrate.  Patient with no pulmonary symptoms.  Urinalysis done nitrite negative, leukocytes negative.  Blood cultures pending.  Leukocytosis trending down.  Will discontinue IV vancomycin for now.  Continue empiric IV cefepime pending blood culture results.  If blood cultures remain negative after 3 days we will discontinue IV antibiotics.  7.  Morbid obesity     DVT prophylaxis: SCD Code Status: Full Family Communication: Updated patient.  No family at bedside. Disposition Plan: Likely back home when clinically improved and work-up is completed.   Consultants:   Gastroenterology: Dr. Ardis Hughs 05/25/2019  Procedures:   CT angiogram chest 05/24/2019  2 units packed red blood cell transfusion 05/24/2019>> 05/25/2019  Right lower extremity Doppler 05/24/2019  Chest x-ray 05/24/2019, 05/25/2019  Renal ultrasound pending 05/24/2019  Antimicrobials:   IV cefepime 05/24/2019  IV vancomycin 05/24/2019>>>>> 05/25/2019   Subjective: Patient laying in bed.  Patient denies any chest pain.  No shortness of breath.  Patient denies any melanotic stools prior to admission.  Denies any hematemesis.  Denies any shortness of breath.  No chest pain.  Complains of right upper thigh pain and swelling.  Objective: Vitals:   05/25/19 0501 05/25/19 0507 05/25/19 0745 05/25/19  0820  BP: (!) 111/47  (!) 100/46 (!) 116/57  Pulse: 85 85 89 84  Resp: 17  16 17   Temp: 97.7 F (36.5 C)  97.7 F (36.5 C) 97.7 F (36.5 C)  TempSrc: Oral  Oral Oral  SpO2: 95%  96% 96%  Weight:      Height:        Intake/Output Summary (Last 24 hours) at 05/25/2019 1022 Last data filed at 05/25/2019 0501 Gross per 24 hour  Intake 2726.51 ml  Output 340 ml  Net 2386.51 ml   Filed Weights   05/24/19 0853  Weight: (!) 233.5 kg    Examination:  General exam: No acute distress. Respiratory system: Clear to auscultation.  No wheezes, no crackles, no rhonchi.  Respiratory effort normal. Cardiovascular system: S1 & S2 heard, RRR. No JVD, murmurs, rubs, gallops or clicks.  Right lower extremity swelling. Gastrointestinal system: Abdomen is nondistended, soft and nontender, obese. No organomegaly or masses felt. Normal bowel sounds heard. Central nervous system: Alert and oriented. No focal neurological deficits. Extremities: Right lower extremity swelling greater than left lower extremity.  Symmetric 5 x 5 power. Skin: No rashes, lesions or ulcers Psychiatry: Judgement and insight appear normal. Mood & affect appropriate.     Data Reviewed: I have personally reviewed following  labs and imaging studies  CBC: Recent Labs  Lab 05/24/19 0629 05/24/19 2045 05/25/19 0718  WBC 18.3* 14.3* 11.9*  NEUTROABS 14.9* 10.7* 9.0*  HGB 7.2* 6.5* 7.1*  HCT 27.0* 24.1* 25.9*  MCV 74.8* 72.8* 73.8*  PLT 386 333 376   Basic Metabolic Panel: Recent Labs  Lab 05/24/19 0629 05/25/19 0718  NA 133* 133*  K 4.0 4.4  CL 100 102  CO2 21* 19*  GLUCOSE 140* 155*  BUN 19 26*  CREATININE 1.35* 2.07*  CALCIUM 10.3 10.1  MG  --  1.7   GFR: Estimated Creatinine Clearance: 69.9 mL/min (A) (by C-G formula based on SCr of 2.07 mg/dL (H)). Liver Function Tests: Recent Labs  Lab 05/24/19 0629 05/25/19 0718  AST 11* 14*  ALT 10 11  ALKPHOS 60 67  BILITOT 0.5 1.0  PROT 8.7* 7.4  ALBUMIN  3.9 3.2*   No results for input(s): LIPASE, AMYLASE in the last 168 hours. No results for input(s): AMMONIA in the last 168 hours. Coagulation Profile: Recent Labs  Lab 05/24/19 0629  INR 1.1   Cardiac Enzymes: No results for input(s): CKTOTAL, CKMB, CKMBINDEX, TROPONINI in the last 168 hours. BNP (last 3 results) No results for input(s): PROBNP in the last 8760 hours. HbA1C: No results for input(s): HGBA1C in the last 72 hours. CBG: No results for input(s): GLUCAP in the last 168 hours. Lipid Profile: No results for input(s): CHOL, HDL, LDLCALC, TRIG, CHOLHDL, LDLDIRECT in the last 72 hours. Thyroid Function Tests: No results for input(s): TSH, T4TOTAL, FREET4, T3FREE, THYROIDAB in the last 72 hours. Anemia Panel: No results for input(s): VITAMINB12, FOLATE, FERRITIN, TIBC, IRON, RETICCTPCT in the last 72 hours. Sepsis Labs: Recent Labs  Lab 05/24/19 0644 05/24/19 0823  LATICACIDVEN 2.9* 1.8    Recent Results (from the past 240 hour(s))  Blood Culture (routine x 2)     Status: None (Preliminary result)   Collection Time: 05/24/19  8:35 AM   Specimen: Left Antecubital; Blood  Result Value Ref Range Status   Specimen Description LEFT ANTECUBITAL Blood Culture adequate volume  Final   Special Requests BOTTLES DRAWN AEROBIC AND ANAEROBIC  Final   Culture   Final    NO GROWTH < 24 HOURS Performed at [facility], [address] [place], [place] [address]    Report Status PENDING  Incomplete  SARS Coronavirus 2 (CEPHEID - Performed in [place] hospital lab), Hosp Order     Status: None   Collection Time: 05/24/19  8:35 AM   Specimen: Nasopharyngeal Swab  Result Value Ref Range Status   SARS Coronavirus 2 NEGATIVE NEGATIVE Final    Comment: (NOTE) If result is NEGATIVE SARS-CoV-2 target nucleic acids are NOT DETECTED. The SARS-CoV-2 RNA is generally detectable in upper and lower  respiratory specimens during the acute phase of infection. The lowest  concentration of  SARS-CoV-2 viral copies this assay can detect is 250  copies / mL. A negative result does not preclude SARS-CoV-2 infection  and should not be used as the sole basis for treatment or other  patient management decisions.  A negative result may occur with  improper specimen collection / handling, submission of specimen other  than nasopharyngeal swab, presence of viral mutation(s) within the  areas targeted by this assay, and inadequate number of viral copies  (<250 copies / mL). A negative result must be combined with clinical  observations, patient history, and epidemiological information. If result is POSITIVE SARS-CoV-2 target nucleic acids are DETECTED.  The SARS-CoV-2 RNA is generally detectable in upper and lower  respiratory specimens dur ing the acute phase of infection.  Positive  results are indicative of active infection with SARS-CoV-2.  Clinical  correlation with patient history and other diagnostic information is  necessary to determine patient infection status.  Positive results do  not rule out bacterial infection or co-infection with other viruses. If result is PRESUMPTIVE POSTIVE SARS-CoV-2 nucleic acids MAY BE PRESENT.   A presumptive positive result was obtained on the submitted specimen  and confirmed on repeat testing.  While 2019 novel coronavirus  (SARS-CoV-2) nucleic acids may be present in the submitted sample  additional confirmatory testing may be necessary for epidemiological  and / or clinical management purposes  to differentiate between  SARS-CoV-2 and other Sarbecovirus currently known to infect humans.  If clinically indicated additional testing with an alternate test  methodology [phone]) is advised. The SARS-CoV-2 RNA is generally  detectable in upper and lower respiratory sp ecimens during the acute  phase of infection. The expected result is Negative. Fact Sheet for Patients:  StrictlyIdeas.no Fact Sheet for Healthcare  Providers: BankingDealers.co.za This test is not yet approved or cleared by the [place] FDA and has been authorized for detection and/or diagnosis of SARS-CoV-2 by FDA under an Emergency Use Authorization (EUA).  This EUA will remain in effect (meaning this test can be used) for the duration of the COVID-19 declaration under Section 564(b)(1) of the Act, 21 U.S.C. section 360bbb-3(b)(1), unless the authorization is terminated or revoked sooner. Performed at [facility], [address] [place], [place] [address]   Blood Culture (routine x 2)     Status: None (Preliminary result)   Collection Time: 05/24/19  8:36 AM   Specimen: BLOOD LEFT HAND  Result Value Ref Range Status   Specimen Description BLOOD LEFT HAND Blood Culture adequate volume  Final   Special Requests BOTTLES DRAWN AEROBIC AND ANAEROBIC  Final   Culture   Final    NO GROWTH < 24 HOURS Performed at [facility], [address], [place], [place] [address]    Report Status PENDING  Incomplete         Radiology Studies: Ct Angio Chest Pe W Or Wo Contrast  Result Date: 05/24/2019 CLINICAL DATA:  Chest pain EXAM: CT ANGIOGRAPHY CHEST WITH CONTRAST TECHNIQUE: Multidetector CT imaging of the chest was performed using the standard protocol during bolus administration of intravenous contrast. Multiplanar CT image reconstructions and MIPs were obtained to evaluate the vascular anatomy. CONTRAST:  161mL OMNIPAQUE IOHEXOL 350 MG/ML SOLN COMPARISON:  None. FINDINGS: Cardiovascular: The thoracic aorta shows no aneurysmal dilatation or evidence of dissection. No cardiac enlargement is seen. No coronary calcifications are noted. Pulmonary artery shows a normal branching pattern without intraluminal filling defect to suggest pulmonary embolism. Mediastinum/Nodes: Large hiatal hernia is noted with approximately 50% of the stomach within the chest cavity. Thoracic inlet is within normal limits. No hilar or  mediastinal adenopathy is noted. Lungs/Pleura: Lungs are well aerated bilaterally. No focal infiltrate or sizable effusion is seen. No parenchymal nodules are noted. Upper Abdomen: Visualized upper abdomen shows the large hiatal hernia. There is a focus of increased attenuation identified posteriorly best seen on image number 177 of series 7. It would be difficult to exclude the possibility of gastric hemorrhage based on this density. Musculoskeletal: No acute bony abnormality is noted. Review of the MIP images confirms the above findings. IMPRESSION: No evidence of pulmonary emboli. Large hiatal hernia. There is a  dependent density within the hiatal hernia suspicious for active hemorrhage. Clinical correlation is recommended. These results will be called to the ordering clinician or representative by the Radiologist Assistant, and communication documented in the PACS or zVision Dashboard. Electronically Signed   By: Inez Catalina M.D.   On: 05/24/2019 19:02   US Venous Img Lower Right (dvt Study)  Result Date: 05/24/2019 CLINICAL DATA:  Right leg pain for 2 days. EXAM: Right LOWER EXTREMITY VENOUS DOPPLER ULTRASOUND TECHNIQUE: Gray-scale sonography with graded compression, as well as color Doppler and duplex ultrasound were performed to evaluate the lower extremity deep venous systems from the level of the common femoral vein and including the common femoral, femoral, profunda femoral, popliteal and calf veins including the posterior tibial, peroneal and gastrocnemius veins when visible. The superficial great saphenous vein was also interrogated. Spectral Doppler was utilized to evaluate flow at rest and with distal augmentation maneuvers in the common femoral, femoral and popliteal veins. COMPARISON:  None. FINDINGS: Contralateral Common Femoral Vein: Noncompressible acute appearing thrombus. Common Femoral Vein: Noncompressible acute appearing thrombus. Saphenofemoral Junction: Noncompressible acute appearing  thrombus. Profunda Femoral Vein: Noncompressible acute appearing thrombus. Femoral Vein: Noncompressible acute appearing thrombus. Popliteal Vein: Noncompressible acute appearing thrombus. Calf Veins: Thrombus evident in the posterior tibial and peroneal veins. Superficial Great Saphenous Vein: Evidence of thrombus. Venous Reflux:  None. Other Findings:  None. IMPRESSION: Diffuse acute deep venous thrombosis extending from the right common femoral vein through the calf veins. Electronically Signed   By: Lajean Manes M.D.   On: 05/24/2019 10:07   Portable Chest 1 View  Result Date: 05/25/2019 CLINICAL DATA:  Pneumonia.  Follow-up exam. EXAM: PORTABLE CHEST 1 VIEW COMPARISON:  CTA chest, 05/24/2019. FINDINGS: Exam limited by the semi-erect, rotated AP technique. The left lung base is obscured. The visualized portions of the lungs are clear. IMPRESSION: 1. No acute cardiopulmonary disease. Left lower lung is not well assessed on this limited AP study. Electronically Signed   By: Lajean Manes M.D.   On: 05/25/2019 09:01   Dg Chest Port 1 View  Result Date: 05/24/2019 CLINICAL DATA:  Hypotension EXAM: PORTABLE CHEST 1 VIEW COMPARISON:  February 21 2017 FINDINGS: Airspace opacity is noted in the medial left base. There is cardiomegaly with pulmonary vascularity within normal limits. No adenopathy. No bone lesions. IMPRESSION: Opacity medial left base concerning for consolidation/pneumonia. Lungs elsewhere clear. There is cardiomegaly. No adenopathy evident. Electronically Signed   By: Lowella Grip III M.D.   On: 05/24/2019 09:08   Dg Femur Portable Min 2 Views Right  Result Date: 05/24/2019 CLINICAL DATA:  Reason for exam:Patient states about 2 days ago she was getting out of the shower when she was walking her legs bumped together and she felt a pop in her right leg. Patient complaining of right leg pain. Patient was able to stand and ambulate to the bed. Patient took 2 Ibuprofen at home a few hours ago  and states that it did help with pain but pain level is a 5 currently EXAM: RIGHT FEMUR PORTABLE 2 VIEW COMPARISON:  None. FINDINGS: Exam limited by patient's body habitus. Allowing for this, there is no convincing fracture. No bone lesion. Hip and knee joints appear normally aligned. IMPRESSION: Limited exam.  No convincing fracture.  No dislocation. Electronically Signed   By: Lajean Manes M.D.   On: 05/24/2019 08:05        Scheduled Meds:  sodium chloride   Intravenous Once   acetaminophen  650 mg  Oral Once   diltiazem  180 mg Oral Daily   diphenhydrAMINE  25 mg Oral Once   docusate sodium  100 mg Oral BID   furosemide  20 mg Intravenous Once   lisinopril  20 mg Oral Daily   montelukast  10 mg Oral Daily   nortriptyline  25 mg Oral QHS   [START ON 05/28/2019] pantoprazole  40 mg Intravenous Q12H   sertraline  100 mg Oral BID   sodium chloride flush  10-40 mL Intracatheter Q12H   sodium chloride flush  3 mL Intravenous Q12H   topiramate  50 mg Oral Daily   Continuous Infusions:  sodium chloride     sodium chloride 100 mL/hr at 05/25/19 0952   ceFEPime (MAXIPIME) IV 2 g (05/25/19 0327)   magnesium sulfate bolus IVPB 4 g (05/25/19 1021)   octreotide  (SANDOSTATIN)    IV infusion 50 mcg/hr (05/25/19 1000)   pantoprazole (PROTONIX) IVPB     pantoprozole (PROTONIX) infusion     vancomycin (VANCOCIN) 1500 mg/53mL IVPB (Vial-Mate [employer]) 1,500 mg (05/25/19 0612)     LOS: 1 day    Time spent: 92 minutes    Irine Seal, MD [facility]  If 7PM-7AM, please contact night-coverage [email or web address] 05/25/2019, 10:22 AM

## 2019-05-25 NOTE — Progress Notes (Signed)
First unit of blood started. Pt tolerating well. See flow-sheet for documentation.

## 2019-05-25 NOTE — Plan of Care (Signed)
Poc progressing.  

## 2019-05-25 NOTE — Progress Notes (Signed)
PT Cancellation Note  Patient Details Name: Megan Ortiz MRN: [id number] DOB: 1976/03/30   Cancelled Treatment:    Reason Eval/Treat Not Completed: Other (comment)   Received communication from Dr. Grandville Silos to wait on mobilizing and check back tomorrow;   Roney Marion, [place] Pager [phone] Office [phone]    [name] 05/25/2019, 8:49 AM

## 2019-05-25 NOTE — Progress Notes (Signed)
OT Cancellation Note  Patient Details Name: Megan Ortiz MRN: [id number] DOB: November 05, 1976   Cancelled Treatment:    Reason Eval/Treat Not Completed: Other (comment)-- Received communication from Dr. Grandville Silos to wait on mobilizing and check back tomorrow.    Delight Stare, OT Acute Rehabilitation Services Pager [phone] Office [phone]   Delight Stare 05/25/2019, 9:15 AM

## 2019-05-25 NOTE — Op Note (Signed)
[facility] Patient Name: Megan Ortiz Procedure Date : 05/25/2019 MRN: [id number] Attending MD: [name] , MD Date of Birth: December 06, 1975 CSN: [id number] Age: 43 Admit Type: Inpatient Procedure:                Upper GI endoscopy Indications:              CT angio of chest suggested possible active                            bleeding in her stomach, BMI 87, acute on chronic                            anemia, no overt bleeding clinically Providers:                [name], MD, Vista Lawman, RN, Ladona Ridgel, Technician, Cherylynn Ridges, Technician Referring MD:              Medicines:                General Anesthesia Complications:            No immediate complications. Estimated blood loss:                            None. Estimated Blood Loss:     Estimated blood loss: none. Procedure:                Pre-Anesthesia Assessment:                           - Prior to the procedure, a History and Physical                            was performed, and patient medications and                            allergies were reviewed. The patient's tolerance of                            previous anesthesia was also reviewed. The risks                            and benefits of the procedure and the sedation                            options and risks were discussed with the patient.                            All questions were answered, and informed consent                            was obtained. Prior Anticoagulants: The patient has  taken no previous anticoagulant or antiplatelet                            agents. ASA Grade Assessment: IV - A patient with                            severe systemic disease that is a constant threat                            to life. After reviewing the risks and benefits,                            the patient was deemed in satisfactory condition to    undergo the procedure.                           After obtaining informed consent, the endoscope was                            passed under direct vision. Throughout the                            procedure, the patient's blood pressure, pulse, and                            oxygen saturations were monitored continuously. The                            GIF-H190 ([id number]) Olympus gastroscope was                            introduced through the mouth, and advanced to the                            second part of duodenum. The upper GI endoscopy was                            accomplished without difficulty. The patient                            tolerated the procedure well. Scope In: Scope Out: Findings:      A large hiatal hernia was present which was partially filled with solid       and liquid food. The retained food precluded visualization of the entire       stomach but the majority was visible and there were no findings that       would account for any GI bleeding.      No old or recent blood in the stomach.      Gastric contents freely refluxed up into her mid esophagus.      The mucosa along the region of the diaphragmatic hiatus was chronically       inflamed appearing.      The exam was otherwise without abnormality. Impression:               A large  hiatal hernia was present which was                            partially filled with solid and liquid food. The                            retained food precluded visualization of the entire                            stomach but the majority was visible and there were                            no findings that would account for any GI bleeding.                           No old or recent blood in the stomach.                           Gastric contents freely refluxed up into her mid                            esophagus.                           The mucosa along the region of the diaphragmatic                            hiatus  was chronically inflamed appearing. Recommendation:           - Return patient to hospital ward for ongoing care.                           - I am not sure what the CT angiogram finding                            represents however she's had no clinical GI                            bleeding (melena, hematemesis), there was no blood                            in her stomach and no findings that would account                            for any bleeding.                           - Feel free to use whatever bloodthinners are                            needed to treat her comorbid conditions and call if                            she has overt  GI bleeding. Procedure Code(s):        --- Professional ---                           [phone], Esophagogastroduodenoscopy, flexible,                            transoral; diagnostic, including collection of                            specimen(s) by brushing or washing, when performed                            (separate procedure) Diagnosis Code(s):        --- Professional ---                           K44.9, Diaphragmatic hernia without obstruction or                            gangrene                           R12, Heartburn CPT copyright 2019 American [employer] Association. All rights reserved. The codes documented in this report are preliminary and upon coder review may  be revised to meet current compliance requirements. [name], MD 05/25/2019 1:36:06 PM This report has been signed electronically. Number of Addenda: 0

## 2019-05-25 NOTE — Progress Notes (Signed)
In and out cath was 340 cc. Will continue to monitor.

## 2019-05-25 NOTE — Progress Notes (Signed)
Pt received from PACU. VSS. Call light in reach.  Clyde Canterbury, RN

## 2019-05-25 NOTE — H&P (View-Only) (Signed)
Referring Provider: Dr. Irine Seal Primary Care Physician:  Larene Beach, MD Primary Gastroenterologist:  Althia Forts   Reason for Consultation: Anemia   HPI: Megan Ortiz is a 43 y.o. female with a past medical history of morbid obesity, asthma, anxiety, HTN, necrotizing fasciitis, anemia, heavy menses, hiatal hernia, bleeding gastric ulcer 10 years ago while in living in [place].She was hospitalized with UGI bleed at that time, she was  found to have bleeding gastric ulcers. She was discharged home when stabilized then readmitted later the same day  Chest pain/SOB. She was diagnosed with a pulmonary embolism. She was on anticoagulants for several months. She reported having 5 EGDs over the next few years for follow up. Last EGD was approximately 8 yrs ago, no recurrence of bleeding ulcers. Past C-section and cholecystectomy.  She presented to [facility] ED on 7/11 after feeling a pop in the right mid inner thigh with associated pain.   ED Course: She was hypotensive on arrival to [facility] ED.  BP 81/43.  She received 2 L of normal saline and her blood pressure stabilized.   Labs 7/11: Sodium 133.  Potassium 4.0.  BUN 19.  Creatinine 1.35.  Anion gap 12.  Alk phos 60.  Albumin 3.9.  AST 11.  ALT 10.  Total bili 0.5.  Lactic acid 2.9.  WBC 18.3.  Hemoglobin 7.2.  Hematocrit 27.0.  MCV 74.8.  Platelet 386.  INR 1.1.   SARS coronavirus 2 negative.  Repeat CBC 7/11: WBC 14.3.  Hemoglobin 6.5.  Hematocrit 24.1.Marland Kitchen  Elevated  d-dimer of 17.10.  Labs 7/12: Sodium 133.  Potassium 4.4.  BUN 26.  Creatinine 2.07.  Alk phos 67.  Albumin 3.2.  AST 14.  ALT 11.  Total bili 1.0.  WBC 11.9.  Hemoglobin 7.1.  Hematocrit 25.9.  MCV 73.8.  Platelet 295.  Doppler ultrasound of the lower extremity identified a diffuse acute deep venous thrombosis extending from the right common femoral vein through the calf veins.  IV heparin infusion was initiated.    She was transferred for from [facility] to  [facility].  Chest x-ray showed left bibasilar consolidation/pneumonia.  CTA chest: No evidence of pulmonary emboli. Large hiatal hernia. There is a dependent density within the hiatal hernia suspicious for active hemorrhage. Clinical correlation is Recommended.  Hg 6.5  Heparin infusion dc'd  Octreotide infusion initiated. 2 units of PRBCs ordered, 2nd transfusion infusing now   She takes Nexium bid, infrequent heartburn. No dysphagia. No upper or lower abdominal pain. No NSAID use. She passes a normal BM most days. No rectal bleeding or melena.  Heavy menstrual bleeding 4 to 5 days every 30 days. LMP 2 weeks ago.  Past Medical History:  Diagnosis Date   Anemia    Anxiety    Asthma    Gastric ulcer    Headache    Hiatal hernia    Hypertension    PE (pulmonary embolism)    Tachycardia     Past Surgical History:  Procedure Laterality Date   CESAREAN SECTION     CHOLECYSTECTOMY     INCISION AND DRAINAGE ABSCESS Left 02/16/2017   Procedure: DEBRIDEMENT LEFT BUTTOCK ABSCESS;  Surgeon: Fanny Skates, MD;  Location: [place];  Service: General;  Laterality: Left;   IRRIGATION AND DEBRIDEMENT BUTTOCKS Left 02/19/2017   Procedure: DEBRIDEMENT OF GLUTEAL WOUND;  Surgeon: Rolm Bookbinder, MD;  Location: [place];  Service: General;  Laterality: Left;    Prior to Admission medications  Medication Sig Start Date End Date Taking? Authorizing Provider  acetaminophen (TYLENOL) 500 MG tablet Take 500 mg by mouth every 6 (six) hours as needed for moderate pain.   Yes [provider]  albuterol (VENTOLIN HFA) 108 (90 BASE) MCG/ACT inhaler Inhale 2 puffs into the lungs every 6 (six) hours as needed for wheezing.   Yes [provider]  diltiazem (TIAZAC) 180 MG 24 hr capsule Take 180 mg by mouth daily.   Yes [provider]  esomeprazole (NEXIUM) 20 MG capsule Take 20 mg by mouth daily at 12 noon.   Yes [provider]  lisinopril  (ZESTRIL) 20 MG tablet Take 20 mg by mouth daily.   Yes [provider]  montelukast (SINGULAIR) 10 MG tablet Take 10 mg by mouth daily.   Yes [provider]  nortriptyline (PAMELOR) 25 MG capsule Take 25 mg by mouth at bedtime.   Yes [provider]  ondansetron (ZOFRAN) 4 MG tablet Take 1 tablet (4 mg total) by mouth every 8 (eight) hours as needed for nausea or vomiting. 03/09/17  Yes Rai, Ripudeep K, MD  sertraline (ZOLOFT) 100 MG tablet Take 1 tablet (100 mg total) by mouth 2 (two) times daily. 01/19/15  Yes Cloria Spring, MD  topiramate (TOPAMAX) 50 MG tablet Take 50 mg by mouth daily. 01/24/17  Yes [provider]    Current Facility-Administered Medications  Medication Dose Route Frequency Provider Last Rate Last Dose   0.9 %  sodium chloride infusion  250 mL Intravenous PRN Johnson, Clanford L, MD       0.9 %  sodium chloride infusion   Intravenous Continuous Eugenie Filler, MD 50 mL/hr at 05/24/19 1847     acetaminophen (TYLENOL) tablet 650 mg  650 mg Oral Q6H PRN Johnson, Clanford L, MD       Or   acetaminophen (TYLENOL) suppository 650 mg  650 mg Rectal Q6H PRN Johnson, Clanford L, MD       albuterol (PROVENTIL) (2.5 MG/3ML) 0.083% nebulizer solution 3 mL  3 mL Inhalation Q6H PRN Johnson, Clanford L, MD       ceFEPIme (MAXIPIME) 2 g in sodium chloride 0.9 % 100 mL IVPB  2 g Intravenous Q8H Johnson, Clanford L, MD 200 mL/hr at 05/25/19 0327 2 g at 05/25/19 0327   diltiazem (CARDIZEM CD) 24 hr capsule 180 mg  180 mg Oral Daily Johnson, Clanford L, MD       docusate sodium (COLACE) capsule 100 mg  100 mg Oral BID Johnson, Clanford L, MD       lisinopril (ZESTRIL) tablet 20 mg  20 mg Oral Daily Johnson, Clanford L, MD       montelukast (SINGULAIR) tablet 10 mg  10 mg Oral Daily Johnson, Clanford L, MD   10 mg at 05/24/19 2143   nortriptyline (PAMELOR) capsule 25 mg  25 mg Oral QHS Johnson, Clanford L, MD   25 mg at 05/24/19 2233    octreotide (SANDOSTATIN) 500 mcg in sodium chloride 0.9 % 250 mL (2 mcg/mL) infusion  50 mcg/hr Intravenous Continuous Lovey Newcomer T, NP 25 mL/hr at 05/24/19 2237 50 mcg/hr at 05/24/19 2237   ondansetron (ZOFRAN) tablet 4 mg  4 mg Oral Q8H PRN Johnson, Clanford L, MD       oxyCODONE (Oxy IR/ROXICODONE) immediate release tablet 5-10 mg  5-10 mg Oral Q4H PRN Johnson, Clanford L, MD   10 mg at 05/25/19 0537   pantoprazole (PROTONIX) 80 mg in sodium  chloride 0.9 % 100 mL IVPB  80 mg Intravenous Once Eugenie Filler, MD       pantoprazole (PROTONIX) 80 mg in sodium chloride 0.9 % 250 mL (0.32 mg/mL) infusion  8 mg/hr Intravenous Continuous Eugenie Filler, MD       [START ON 05/28/2019] pantoprazole (PROTONIX) injection 40 mg  40 mg Intravenous Q12H Eugenie Filler, MD       sertraline (ZOLOFT) tablet 100 mg  100 mg Oral BID Wynetta Emery, Clanford L, MD   100 mg at 05/24/19 2142   sodium chloride flush (NS) 0.9 % injection 10-40 mL  10-40 mL Intracatheter Q12H Johnson, Clanford L, MD   20 mL at 05/25/19 0522   sodium chloride flush (NS) 0.9 % injection 10-40 mL  10-40 mL Intracatheter PRN Johnson, Clanford L, MD       sodium chloride flush (NS) 0.9 % injection 3 mL  3 mL Intravenous Q12H Johnson, Clanford L, MD   3 mL at 05/24/19 2147   sodium chloride flush (NS) 0.9 % injection 3 mL  3 mL Intravenous PRN Johnson, Clanford L, MD       topiramate (TOPAMAX) tablet 50 mg  50 mg Oral Daily Johnson, Clanford L, MD       Vancomycin (VANCOCIN) 1,500 mg in sodium chloride 0.9 % 500 mL IVPB  1,500 mg Intravenous Q8H Johnson, Clanford L, MD 250 mL/hr at 05/25/19 0612 1,500 mg at 05/25/19 0612   zolpidem (AMBIEN) tablet 5 mg  5 mg Oral QHS PRN Murlean Iba, MD        Allergies as of 05/24/2019 - Review Complete 05/24/2019  Allergen Reaction Noted   Aspirin  02/15/2017    Family History  Problem Relation Age of Onset   Depression Mother    Alcohol abuse Mother     Social History    Socioeconomic History   Marital status: Married    Spouse name: Not on file   Number of children: Not on file   Years of education: Not on file   Highest education level: Not on file  Occupational History   Not on file  Social Needs   Financial resource strain: Not on file   Food insecurity    Worry: Not on file    Inability: Not on file   Transportation needs    Medical: Not on file    Non-medical: Not on file  Tobacco Use   Smoking status: Former Smoker   Smokeless tobacco: Never Used   Tobacco comment: QUIT ABOUT 5 YEARS AGO  Substance and Sexual Activity   Alcohol use: Yes    Comment: occa   Drug use: No   Sexual activity: Yes    Birth control/protection: None, Condom  Lifestyle   Physical activity    Days per week: Not on file    Minutes per session: Not on file   Stress: Not on file  Relationships   Social connections    Talks on phone: Not on file    Gets together: Not on file    Attends religious service: Not on file    Active member of club or organization: Not on file    Attends meetings of clubs or organizations: Not on file    Relationship status: Not on file   Intimate partner violence    Fear of current or ex partner: Not on file    Emotionally abused: Not on file    Physically abused: Not on file  Forced sexual activity: Not on file  Other Topics Concern   Not on file  Social History Narrative   Not on file    Review of Systems: See HPI, all other systems reviewed and negative   Physical Exam: Vital signs in last 24 hours: Temp:  [97.7 F (36.5 C)-98.4 F (36.9 C)] 97.7 F (36.5 C) (07/12 0820) Pulse Rate:  [79-108] 84 (07/12 0820) Resp:  [16-22] 17 (07/12 0820) BP: (91-116)/(46-69) 116/57 (07/12 0820) SpO2:  [90 %-99 %] 96 % (07/12 0820) Weight:  [233.5 kg] 233.5 kg (07/11 0853) Last BM Date: 05/24/19 General: Morbidly obese female in NAD, Alert.  Head:  Normocephalic and atraumatic. Eyes:  Sclera clear, no  icterus. Conjunctiva pink. Ears:  Normal auditory acuity. Nose:  No deformity, discharge or lesions. Mouth: Poor dentition.  Neck:  Supple. Lungs:  Clear throughout lung fields.  Heart:  RRR, no murmurs. Abdomen:  Obese, soft, nontender, + BS x 4 quads. Rectal:  Patient declined rectal exam. Msk:  Symmetrical without gross deformities. Right thigh edematous with slight purplish discoloration. Pulses:  Normal pulses noted. Bilateral LEs equally warm to touch. Extremities:  Without clubbing or edema. Neurologic:  Alert and  oriented x4;  grossly normal neurologically. Skin:  Intact without significant lesions or rashes.. Psych:  Alert and cooperative. Normal mood and affect.  Intake/Output from previous day: 07/11 0701 - 07/12 0700 In: 3826.5 [I.V.:225.6; Blood:423; IV Piggyback:3177.9] Out: 340 [Urine:340] Intake/Output this shift: No intake/output data recorded.  Lab Results: Recent Labs    05/24/19 0629 05/24/19 2045 05/25/19 0718  WBC 18.3* 14.3* 11.9*  HGB 7.2* 6.5* 7.1*  HCT 27.0* 24.1* 25.9*  PLT 386 333 295   BMET Recent Labs    05/24/19 0629 05/25/19 0718  NA 133* 133*  K 4.0 4.4  CL 100 102  CO2 21* 19*  GLUCOSE 140* 155*  BUN 19 26*  CREATININE 1.35* 2.07*  CALCIUM 10.3 10.1   LFT Recent Labs    05/25/19 0718  PROT 7.4  ALBUMIN 3.2*  AST 14*  ALT 11  ALKPHOS 67  BILITOT 1.0   PT/INR Recent Labs    05/24/19 0629  LABPROT 13.8  INR 1.1   Hepatitis Panel No results for input(s): HEPBSAG, HCVAB, HEPAIGM, HEPBIGM in the last 72 hours.    Studies/Results: Ct Angio Chest Pe W Or Wo Contrast  Result Date: 05/24/2019 CLINICAL DATA:  Chest pain EXAM: CT ANGIOGRAPHY CHEST WITH CONTRAST TECHNIQUE: Multidetector CT imaging of the chest was performed using the standard protocol during bolus administration of intravenous contrast. Multiplanar CT image reconstructions and MIPs were obtained to evaluate the vascular anatomy. CONTRAST:  172m OMNIPAQUE  IOHEXOL 350 MG/ML SOLN COMPARISON:  None. FINDINGS: Cardiovascular: The thoracic aorta shows no aneurysmal dilatation or evidence of dissection. No cardiac enlargement is seen. No coronary calcifications are noted. Pulmonary artery shows a normal branching pattern without intraluminal filling defect to suggest pulmonary embolism. Mediastinum/Nodes: Large hiatal hernia is noted with approximately 50% of the stomach within the chest cavity. Thoracic inlet is within normal limits. No hilar or mediastinal adenopathy is noted. Lungs/Pleura: Lungs are well aerated bilaterally. No focal infiltrate or sizable effusion is seen. No parenchymal nodules are noted. Upper Abdomen: Visualized upper abdomen shows the large hiatal hernia. There is a focus of increased attenuation identified posteriorly best seen on image number 177 of series 7. It would be difficult to exclude the possibility of gastric hemorrhage based on this density. Musculoskeletal: No acute bony abnormality is  noted. Review of the MIP images confirms the above findings. IMPRESSION: No evidence of pulmonary emboli. Large hiatal hernia. There is a dependent density within the hiatal hernia suspicious for active hemorrhage. Clinical correlation is recommended. These results will be called to the ordering clinician or representative by the Radiologist Assistant, and communication documented in the PACS or zVision Dashboard. Electronically Signed   By: Inez Catalina M.D.   On: 05/24/2019 19:02   US Venous Img Lower Right (dvt Study)  Result Date: 05/24/2019 CLINICAL DATA:  Right leg pain for 2 days. EXAM: Right LOWER EXTREMITY VENOUS DOPPLER ULTRASOUND TECHNIQUE: Gray-scale sonography with graded compression, as well as color Doppler and duplex ultrasound were performed to evaluate the lower extremity deep venous systems from the level of the common femoral vein and including the common femoral, femoral, profunda femoral, popliteal and calf veins including the  posterior tibial, peroneal and gastrocnemius veins when visible. The superficial great saphenous vein was also interrogated. Spectral Doppler was utilized to evaluate flow at rest and with distal augmentation maneuvers in the common femoral, femoral and popliteal veins. COMPARISON:  None. FINDINGS: Contralateral Common Femoral Vein: Noncompressible acute appearing thrombus. Common Femoral Vein: Noncompressible acute appearing thrombus. Saphenofemoral Junction: Noncompressible acute appearing thrombus. Profunda Femoral Vein: Noncompressible acute appearing thrombus. Femoral Vein: Noncompressible acute appearing thrombus. Popliteal Vein: Noncompressible acute appearing thrombus. Calf Veins: Thrombus evident in the posterior tibial and peroneal veins. Superficial Great Saphenous Vein: Evidence of thrombus. Venous Reflux:  None. Other Findings:  None. IMPRESSION: Diffuse acute deep venous thrombosis extending from the right common femoral vein through the calf veins. Electronically Signed   By: Lajean Manes M.D.   On: 05/24/2019 10:07   Dg Chest Port 1 View  Result Date: 05/24/2019 CLINICAL DATA:  Hypotension EXAM: PORTABLE CHEST 1 VIEW COMPARISON:  February 21 2017 FINDINGS: Airspace opacity is noted in the medial left base. There is cardiomegaly with pulmonary vascularity within normal limits. No adenopathy. No bone lesions. IMPRESSION: Opacity medial left base concerning for consolidation/pneumonia. Lungs elsewhere clear. There is cardiomegaly. No adenopathy evident. Electronically Signed   By: Lowella Grip III M.D.   On: 05/24/2019 09:08   Dg Femur Portable Min 2 Views Right  Result Date: 05/24/2019 CLINICAL DATA:  Reason for exam:Patient states about 2 days ago she was getting out of the shower when she was walking her legs bumped together and she felt a pop in her right leg. Patient complaining of right leg pain. Patient was able to stand and ambulate to the bed. Patient took 2 Ibuprofen at home a few  hours ago and states that it did help with pain but pain level is a 5 currently EXAM: RIGHT FEMUR PORTABLE 2 VIEW COMPARISON:  None. FINDINGS: Exam limited by patient's body habitus. Allowing for this, there is no convincing fracture. No bone lesion. Hip and knee joints appear normally aligned. IMPRESSION: Limited exam.  No convincing fracture.  No dislocation. Electronically Signed   By: Lajean Manes M.D.   On: 05/24/2019 08:05    IMPRESSION/PLAN:  1.  Acute on Chronic Anemia. CTA chest identified a large hiatal hernia. There is a dependent density within the hiatal hernia suspicious for active hemorrhage. Hg 7.1 up from 6.5. BUN 26. 2 units of PRBCs ordered, 2nd unit infusing now. No hematemesis or melena.  -NPO -EGD, most likely will need intubated for procedure  -continue Octreotide 42m/min -continue PPI infusion  2. DVT to the right lower extremity, off heparin secondary to GIB  3. ? Sepsis, Pneumonia on Cefepime/Vanco/Flagyl  4. Leukocytosis. WBC 11.9 down from 14.3.  5. AKI Cr. 2.07  Further recommendations per Dr. Wilma Flavin Dorathy Daft  05/25/2019, 8:41 AM   ________________________________________________________________________  Velora Heckler GI MD note:  I personally examined the patient, reviewed the data and agree with the assessment and plan described above.  SHe's had no overt bleeding however with extreme obesity (BMI 86) she may not be able to tell if she's been having dark, melenic stools.  CT angio suggests bleeding in her stomach.  After 2 units PRBC her hb only bumped from 6.5 to 7.1 and so I have concern that she truly is having UGI bleeding.  She is on both PPI and octreotide infusions already. I recommended EGD today with anesthesia assistance and she agreed. She understands she is at higher than usual risks for procedure related complications given her serious comorbid conditions.   Owens Loffler, MD [facility] Pager [phone]

## 2019-05-25 NOTE — Interval H&P Note (Signed)
History and Physical Interval Note:  05/25/2019 12:23 PM  Megan Ortiz  has presented today for surgery, with the diagnosis of gi bleed.  The various methods of treatment have been discussed with the patient and family. After consideration of risks, benefits and other options for treatment, the patient has consented to  Procedure(s): ESOPHAGOGASTRODUODENOSCOPY (EGD) WITH PROPOFOL (N/A) as a surgical intervention.  The patient's history has been reviewed, patient examined, no change in status, stable for surgery.  I have reviewed the patient's chart and labs.  Questions were answered to the patient's satisfaction.     [name]

## 2019-05-25 NOTE — Progress Notes (Signed)
  Echocardiogram 2D Echocardiogram has been performed.  Megan Ortiz 05/25/2019, 3:50 PM

## 2019-05-25 NOTE — Anesthesia Postprocedure Evaluation (Signed)
Anesthesia Post Note  Patient: Megan Ortiz  Procedure(s) Performed: ESOPHAGOGASTRODUODENOSCOPY (EGD) WITH PROPOFOL (N/A )     Patient location during evaluation: PACU Anesthesia Type: General Level of consciousness: awake and alert Pain management: pain level controlled Vital Signs Assessment: post-procedure vital signs reviewed and stable Respiratory status: spontaneous breathing, nonlabored ventilation, respiratory function stable and patient connected to nasal cannula oxygen Cardiovascular status: blood pressure returned to baseline and stable Postop Assessment: no apparent nausea or vomiting Anesthetic complications: no    Last Vitals:  Vitals:   05/25/19 1332 05/25/19 1347  BP:  (!) 96/55  Pulse:  (!) 104  Resp:  16  Temp: (!) 36.2 C   SpO2:  97%    Last Pain:  Vitals:   05/25/19 1332  TempSrc:   PainSc: 0-No pain                 Effie Berkshire

## 2019-05-25 NOTE — Progress Notes (Signed)
Text paged on call NP X Blount as pt has not voided yet per pt  She last voided at 0430 yesterday morning. Bladder scan was <60 ml. Pt did say she feels pressure and has urge to void but cant. Received order for in and out cath. Will continue to monitor.

## 2019-05-25 NOTE — Progress Notes (Signed)
  Echocardiogram 2D Echocardiogram was attempted but patient was in Endo.   Jennette Dubin 05/25/2019, 12:33 PM

## 2019-05-25 NOTE — Anesthesia Preprocedure Evaluation (Addendum)
Anesthesia Evaluation  Patient identified by MRN, date of birth, ID band Patient awake    Reviewed: Allergy & Precautions, NPO status , Patient's Chart, lab work & pertinent test results  Airway Mallampati: I  TM Distance: >3 FB Neck ROM: Full    Dental  (+) Edentulous Upper, Edentulous Lower   Pulmonary asthma , former smoker,    breath sounds clear to auscultation       Cardiovascular hypertension, Pt. on medications  Rhythm:Regular Rate:Normal     Neuro/Psych  Headaches, Anxiety    GI/Hepatic Neg liver ROS, hiatal hernia, PUD, GERD  Medicated,  Endo/Other  negative endocrine ROS  Renal/GU      Musculoskeletal   Abdominal (+) + obese,   Peds  Hematology   Anesthesia Other Findings   Reproductive/Obstetrics                            Anesthesia Physical Anesthesia Plan  ASA: IV  Anesthesia Plan: General   Post-op Pain Management:    Induction: Rapid sequence, Cricoid pressure planned and Intravenous  PONV Risk Score and Plan: Ondansetron, Dexamethasone and Midazolam  Airway Management Planned: Oral ETT  Additional Equipment: None  Intra-op Plan:   Post-operative Plan: Extubation in OR  Informed Consent: I have reviewed the patients History and Physical, chart, labs and discussed the procedure including the risks, benefits and alternatives for the proposed anesthesia with the patient or authorized representative who has indicated his/her understanding and acceptance.       Plan Discussed with: CRNA  Anesthesia Plan Comments:        Anesthesia Quick Evaluation

## 2019-05-25 NOTE — Consult Note (Addendum)
Referring Provider: Dr. Irine Seal Primary Care Physician:  Larene Beach, MD Primary Gastroenterologist:  Althia Forts   Reason for Consultation: Anemia   HPI: Megan Ortiz is a 43 y.o. female with a past medical history of morbid obesity, asthma, anxiety, HTN, necrotizing fasciitis, anemia, heavy menses, hiatal hernia, bleeding gastric ulcer 10 years ago while in living in [place].She was hospitalized with UGI bleed at that time, she was  found to have bleeding gastric ulcers. She was discharged home when stabilized then readmitted later the same day  Chest pain/SOB. She was diagnosed with a pulmonary embolism. She was on anticoagulants for several months. She reported having 5 EGDs over the next few years for follow up. Last EGD was approximately 8 yrs ago, no recurrence of bleeding ulcers. Past C-section and cholecystectomy.  She presented to [facility] ED on 7/11 after feeling a pop in the right mid inner thigh with associated pain.   ED Course: She was hypotensive on arrival to [facility] ED.  BP 81/43.  She received 2 L of normal saline and her blood pressure stabilized.   Labs 7/11: Sodium 133.  Potassium 4.0.  BUN 19.  Creatinine 1.35.  Anion gap 12.  Alk phos 60.  Albumin 3.9.  AST 11.  ALT 10.  Total bili 0.5.  Lactic acid 2.9.  WBC 18.3.  Hemoglobin 7.2.  Hematocrit 27.0.  MCV 74.8.  Platelet 386.  INR 1.1.   SARS coronavirus 2 negative.  Repeat CBC 7/11: WBC 14.3.  Hemoglobin 6.5.  Hematocrit 24.1.Marland Kitchen  Elevated  d-dimer of 17.10.  Labs 7/12: Sodium 133.  Potassium 4.4.  BUN 26.  Creatinine 2.07.  Alk phos 67.  Albumin 3.2.  AST 14.  ALT 11.  Total bili 1.0.  WBC 11.9.  Hemoglobin 7.1.  Hematocrit 25.9.  MCV 73.8.  Platelet 295.  Doppler ultrasound of the lower extremity identified a diffuse acute deep venous thrombosis extending from the right common femoral vein through the calf veins.  IV heparin infusion was initiated.    She was transferred for from [facility] to  [facility].  Chest x-ray showed left bibasilar consolidation/pneumonia.  CTA chest: No evidence of pulmonary emboli. Large hiatal hernia. There is a dependent density within the hiatal hernia suspicious for active hemorrhage. Clinical correlation is Recommended.  Hg 6.5  Heparin infusion dc'd  Octreotide infusion initiated. 2 units of PRBCs ordered, 2nd transfusion infusing now   She takes Nexium bid, infrequent heartburn. No dysphagia. No upper or lower abdominal pain. No NSAID use. She passes a normal BM most days. No rectal bleeding or melena.  Heavy menstrual bleeding 4 to 5 days every 30 days. LMP 2 weeks ago.  Past Medical History:  Diagnosis Date   Anemia    Anxiety    Asthma    Gastric ulcer    Headache    Hiatal hernia    Hypertension    PE (pulmonary embolism)    Tachycardia     Past Surgical History:  Procedure Laterality Date   CESAREAN SECTION     CHOLECYSTECTOMY     INCISION AND DRAINAGE ABSCESS Left 02/16/2017   Procedure: DEBRIDEMENT LEFT BUTTOCK ABSCESS;  Surgeon: Fanny Skates, MD;  Location: [place];  Service: General;  Laterality: Left;   IRRIGATION AND DEBRIDEMENT BUTTOCKS Left 02/19/2017   Procedure: DEBRIDEMENT OF GLUTEAL WOUND;  Surgeon: Rolm Bookbinder, MD;  Location: [place];  Service: General;  Laterality: Left;    Prior to Admission medications  Medication Sig Start Date End Date Taking? Authorizing Provider  acetaminophen (TYLENOL) 500 MG tablet Take 500 mg by mouth every 6 (six) hours as needed for moderate pain.   Yes [provider]  albuterol (VENTOLIN HFA) 108 (90 BASE) MCG/ACT inhaler Inhale 2 puffs into the lungs every 6 (six) hours as needed for wheezing.   Yes [provider]  diltiazem (TIAZAC) 180 MG 24 hr capsule Take 180 mg by mouth daily.   Yes [provider]  esomeprazole (NEXIUM) 20 MG capsule Take 20 mg by mouth daily at 12 noon.   Yes [provider]  lisinopril  (ZESTRIL) 20 MG tablet Take 20 mg by mouth daily.   Yes [provider]  montelukast (SINGULAIR) 10 MG tablet Take 10 mg by mouth daily.   Yes [provider]  nortriptyline (PAMELOR) 25 MG capsule Take 25 mg by mouth at bedtime.   Yes [provider]  ondansetron (ZOFRAN) 4 MG tablet Take 1 tablet (4 mg total) by mouth every 8 (eight) hours as needed for nausea or vomiting. 03/09/17  Yes Rai, Ripudeep K, MD  sertraline (ZOLOFT) 100 MG tablet Take 1 tablet (100 mg total) by mouth 2 (two) times daily. 01/19/15  Yes Cloria Spring, MD  topiramate (TOPAMAX) 50 MG tablet Take 50 mg by mouth daily. 01/24/17  Yes [provider]    Current Facility-Administered Medications  Medication Dose Route Frequency Provider Last Rate Last Dose   0.9 %  sodium chloride infusion  250 mL Intravenous PRN Johnson, Clanford L, MD       0.9 %  sodium chloride infusion   Intravenous Continuous Eugenie Filler, MD 50 mL/hr at 05/24/19 1847     acetaminophen (TYLENOL) tablet 650 mg  650 mg Oral Q6H PRN Johnson, Clanford L, MD       Or   acetaminophen (TYLENOL) suppository 650 mg  650 mg Rectal Q6H PRN Johnson, Clanford L, MD       albuterol (PROVENTIL) (2.5 MG/3ML) 0.083% nebulizer solution 3 mL  3 mL Inhalation Q6H PRN Johnson, Clanford L, MD       ceFEPIme (MAXIPIME) 2 g in sodium chloride 0.9 % 100 mL IVPB  2 g Intravenous Q8H Johnson, Clanford L, MD 200 mL/hr at 05/25/19 0327 2 g at 05/25/19 0327   diltiazem (CARDIZEM CD) 24 hr capsule 180 mg  180 mg Oral Daily Johnson, Clanford L, MD       docusate sodium (COLACE) capsule 100 mg  100 mg Oral BID Johnson, Clanford L, MD       lisinopril (ZESTRIL) tablet 20 mg  20 mg Oral Daily Johnson, Clanford L, MD       montelukast (SINGULAIR) tablet 10 mg  10 mg Oral Daily Johnson, Clanford L, MD   10 mg at 05/24/19 2143   nortriptyline (PAMELOR) capsule 25 mg  25 mg Oral QHS Johnson, Clanford L, MD   25 mg at 05/24/19 2233    octreotide (SANDOSTATIN) 500 mcg in sodium chloride 0.9 % 250 mL (2 mcg/mL) infusion  50 mcg/hr Intravenous Continuous Lovey Newcomer T, NP 25 mL/hr at 05/24/19 2237 50 mcg/hr at 05/24/19 2237   ondansetron (ZOFRAN) tablet 4 mg  4 mg Oral Q8H PRN Johnson, Clanford L, MD       oxyCODONE (Oxy IR/ROXICODONE) immediate release tablet 5-10 mg  5-10 mg Oral Q4H PRN Johnson, Clanford L, MD   10 mg at 05/25/19 0537   pantoprazole (PROTONIX) 80 mg in sodium  chloride 0.9 % 100 mL IVPB  80 mg Intravenous Once Eugenie Filler, MD       pantoprazole (PROTONIX) 80 mg in sodium chloride 0.9 % 250 mL (0.32 mg/mL) infusion  8 mg/hr Intravenous Continuous Eugenie Filler, MD       [START ON 05/28/2019] pantoprazole (PROTONIX) injection 40 mg  40 mg Intravenous Q12H Eugenie Filler, MD       sertraline (ZOLOFT) tablet 100 mg  100 mg Oral BID Wynetta Emery, Clanford L, MD   100 mg at 05/24/19 2142   sodium chloride flush (NS) 0.9 % injection 10-40 mL  10-40 mL Intracatheter Q12H Johnson, Clanford L, MD   20 mL at 05/25/19 0522   sodium chloride flush (NS) 0.9 % injection 10-40 mL  10-40 mL Intracatheter PRN Johnson, Clanford L, MD       sodium chloride flush (NS) 0.9 % injection 3 mL  3 mL Intravenous Q12H Johnson, Clanford L, MD   3 mL at 05/24/19 2147   sodium chloride flush (NS) 0.9 % injection 3 mL  3 mL Intravenous PRN Johnson, Clanford L, MD       topiramate (TOPAMAX) tablet 50 mg  50 mg Oral Daily Johnson, Clanford L, MD       Vancomycin (VANCOCIN) 1,500 mg in sodium chloride 0.9 % 500 mL IVPB  1,500 mg Intravenous Q8H Johnson, Clanford L, MD 250 mL/hr at 05/25/19 0612 1,500 mg at 05/25/19 0612   zolpidem (AMBIEN) tablet 5 mg  5 mg Oral QHS PRN Murlean Iba, MD        Allergies as of 05/24/2019 - Review Complete 05/24/2019  Allergen Reaction Noted   Aspirin  02/15/2017    Family History  Problem Relation Age of Onset   Depression Mother    Alcohol abuse Mother     Social History    Socioeconomic History   Marital status: Married    Spouse name: Not on file   Number of children: Not on file   Years of education: Not on file   Highest education level: Not on file  Occupational History   Not on file  Social Needs   Financial resource strain: Not on file   Food insecurity    Worry: Not on file    Inability: Not on file   Transportation needs    Medical: Not on file    Non-medical: Not on file  Tobacco Use   Smoking status: Former Smoker   Smokeless tobacco: Never Used   Tobacco comment: QUIT ABOUT 5 YEARS AGO  Substance and Sexual Activity   Alcohol use: Yes    Comment: occa   Drug use: No   Sexual activity: Yes    Birth control/protection: None, Condom  Lifestyle   Physical activity    Days per week: Not on file    Minutes per session: Not on file   Stress: Not on file  Relationships   Social connections    Talks on phone: Not on file    Gets together: Not on file    Attends religious service: Not on file    Active member of club or organization: Not on file    Attends meetings of clubs or organizations: Not on file    Relationship status: Not on file   Intimate partner violence    Fear of current or ex partner: Not on file    Emotionally abused: Not on file    Physically abused: Not on file  Forced sexual activity: Not on file  Other Topics Concern   Not on file  Social History Narrative   Not on file    Review of Systems: See HPI, all other systems reviewed and negative   Physical Exam: Vital signs in last 24 hours: Temp:  [97.7 F (36.5 C)-98.4 F (36.9 C)] 97.7 F (36.5 C) (07/12 0820) Pulse Rate:  [79-108] 84 (07/12 0820) Resp:  [16-22] 17 (07/12 0820) BP: (91-116)/(46-69) 116/57 (07/12 0820) SpO2:  [90 %-99 %] 96 % (07/12 0820) Weight:  [233.5 kg] 233.5 kg (07/11 0853) Last BM Date: 05/24/19 General: Morbidly obese female in NAD, Alert.  Head:  Normocephalic and atraumatic. Eyes:  Sclera clear, no  icterus. Conjunctiva pink. Ears:  Normal auditory acuity. Nose:  No deformity, discharge or lesions. Mouth: Poor dentition.  Neck:  Supple. Lungs:  Clear throughout lung fields.  Heart:  RRR, no murmurs. Abdomen:  Obese, soft, nontender, + BS x 4 quads. Rectal:  Patient declined rectal exam. Msk:  Symmetrical without gross deformities. Right thigh edematous with slight purplish discoloration. Pulses:  Normal pulses noted. Bilateral LEs equally warm to touch. Extremities:  Without clubbing or edema. Neurologic:  Alert and  oriented x4;  grossly normal neurologically. Skin:  Intact without significant lesions or rashes.. Psych:  Alert and cooperative. Normal mood and affect.  Intake/Output from previous day: 07/11 0701 - 07/12 0700 In: 3826.5 [I.V.:225.6; Blood:423; IV Piggyback:3177.9] Out: 340 [Urine:340] Intake/Output this shift: No intake/output data recorded.  Lab Results: Recent Labs    05/24/19 0629 05/24/19 2045 05/25/19 0718  WBC 18.3* 14.3* 11.9*  HGB 7.2* 6.5* 7.1*  HCT 27.0* 24.1* 25.9*  PLT 386 333 295   BMET Recent Labs    05/24/19 0629 05/25/19 0718  NA 133* 133*  K 4.0 4.4  CL 100 102  CO2 21* 19*  GLUCOSE 140* 155*  BUN 19 26*  CREATININE 1.35* 2.07*  CALCIUM 10.3 10.1   LFT Recent Labs    05/25/19 0718  PROT 7.4  ALBUMIN 3.2*  AST 14*  ALT 11  ALKPHOS 67  BILITOT 1.0   PT/INR Recent Labs    05/24/19 0629  LABPROT 13.8  INR 1.1   Hepatitis Panel No results for input(s): HEPBSAG, HCVAB, HEPAIGM, HEPBIGM in the last 72 hours.    Studies/Results: Ct Angio Chest Pe W Or Wo Contrast  Result Date: 05/24/2019 CLINICAL DATA:  Chest pain EXAM: CT ANGIOGRAPHY CHEST WITH CONTRAST TECHNIQUE: Multidetector CT imaging of the chest was performed using the standard protocol during bolus administration of intravenous contrast. Multiplanar CT image reconstructions and MIPs were obtained to evaluate the vascular anatomy. CONTRAST:  156m OMNIPAQUE  IOHEXOL 350 MG/ML SOLN COMPARISON:  None. FINDINGS: Cardiovascular: The thoracic aorta shows no aneurysmal dilatation or evidence of dissection. No cardiac enlargement is seen. No coronary calcifications are noted. Pulmonary artery shows a normal branching pattern without intraluminal filling defect to suggest pulmonary embolism. Mediastinum/Nodes: Large hiatal hernia is noted with approximately 50% of the stomach within the chest cavity. Thoracic inlet is within normal limits. No hilar or mediastinal adenopathy is noted. Lungs/Pleura: Lungs are well aerated bilaterally. No focal infiltrate or sizable effusion is seen. No parenchymal nodules are noted. Upper Abdomen: Visualized upper abdomen shows the large hiatal hernia. There is a focus of increased attenuation identified posteriorly best seen on image number 177 of series 7. It would be difficult to exclude the possibility of gastric hemorrhage based on this density. Musculoskeletal: No acute bony abnormality is  noted. Review of the MIP images confirms the above findings. IMPRESSION: No evidence of pulmonary emboli. Large hiatal hernia. There is a dependent density within the hiatal hernia suspicious for active hemorrhage. Clinical correlation is recommended. These results will be called to the ordering clinician or representative by the Radiologist Assistant, and communication documented in the PACS or zVision Dashboard. Electronically Signed   By: Inez Catalina M.D.   On: 05/24/2019 19:02   US Venous Img Lower Right (dvt Study)  Result Date: 05/24/2019 CLINICAL DATA:  Right leg pain for 2 days. EXAM: Right LOWER EXTREMITY VENOUS DOPPLER ULTRASOUND TECHNIQUE: Gray-scale sonography with graded compression, as well as color Doppler and duplex ultrasound were performed to evaluate the lower extremity deep venous systems from the level of the common femoral vein and including the common femoral, femoral, profunda femoral, popliteal and calf veins including the  posterior tibial, peroneal and gastrocnemius veins when visible. The superficial great saphenous vein was also interrogated. Spectral Doppler was utilized to evaluate flow at rest and with distal augmentation maneuvers in the common femoral, femoral and popliteal veins. COMPARISON:  None. FINDINGS: Contralateral Common Femoral Vein: Noncompressible acute appearing thrombus. Common Femoral Vein: Noncompressible acute appearing thrombus. Saphenofemoral Junction: Noncompressible acute appearing thrombus. Profunda Femoral Vein: Noncompressible acute appearing thrombus. Femoral Vein: Noncompressible acute appearing thrombus. Popliteal Vein: Noncompressible acute appearing thrombus. Calf Veins: Thrombus evident in the posterior tibial and peroneal veins. Superficial Great Saphenous Vein: Evidence of thrombus. Venous Reflux:  None. Other Findings:  None. IMPRESSION: Diffuse acute deep venous thrombosis extending from the right common femoral vein through the calf veins. Electronically Signed   By: Lajean Manes M.D.   On: 05/24/2019 10:07   Dg Chest Port 1 View  Result Date: 05/24/2019 CLINICAL DATA:  Hypotension EXAM: PORTABLE CHEST 1 VIEW COMPARISON:  February 21 2017 FINDINGS: Airspace opacity is noted in the medial left base. There is cardiomegaly with pulmonary vascularity within normal limits. No adenopathy. No bone lesions. IMPRESSION: Opacity medial left base concerning for consolidation/pneumonia. Lungs elsewhere clear. There is cardiomegaly. No adenopathy evident. Electronically Signed   By: Lowella Grip III M.D.   On: 05/24/2019 09:08   Dg Femur Portable Min 2 Views Right  Result Date: 05/24/2019 CLINICAL DATA:  Reason for exam:Patient states about 2 days ago she was getting out of the shower when she was walking her legs bumped together and she felt a pop in her right leg. Patient complaining of right leg pain. Patient was able to stand and ambulate to the bed. Patient took 2 Ibuprofen at home a few  hours ago and states that it did help with pain but pain level is a 5 currently EXAM: RIGHT FEMUR PORTABLE 2 VIEW COMPARISON:  None. FINDINGS: Exam limited by patient's body habitus. Allowing for this, there is no convincing fracture. No bone lesion. Hip and knee joints appear normally aligned. IMPRESSION: Limited exam.  No convincing fracture.  No dislocation. Electronically Signed   By: Lajean Manes M.D.   On: 05/24/2019 08:05    IMPRESSION/PLAN:  1.  Acute on Chronic Anemia. CTA chest identified a large hiatal hernia. There is a dependent density within the hiatal hernia suspicious for active hemorrhage. Hg 7.1 up from 6.5. BUN 26. 2 units of PRBCs ordered, 2nd unit infusing now. No hematemesis or melena.  -NPO -EGD, most likely will need intubated for procedure  -continue Octreotide 62m/min -continue PPI infusion  2. DVT to the right lower extremity, off heparin secondary to GIB  3. ? Sepsis, Pneumonia on Cefepime/Vanco/Flagyl  4. Leukocytosis. WBC 11.9 down from 14.3.  5. AKI Cr. 2.07  Further recommendations per Dr. Wilma Flavin Dorathy Daft  05/25/2019, 8:41 AM   ________________________________________________________________________  Velora Heckler GI MD note:  I personally examined the patient, reviewed the data and agree with the assessment and plan described above.  SHe's had no overt bleeding however with extreme obesity (BMI 86) she may not be able to tell if she's been having dark, melenic stools.  CT angio suggests bleeding in her stomach.  After 2 units PRBC her hb only bumped from 6.5 to 7.1 and so I have concern that she truly is having UGI bleeding.  She is on both PPI and octreotide infusions already. I recommended EGD today with anesthesia assistance and she agreed. She understands she is at higher than usual risks for procedure related complications given her serious comorbid conditions.   Owens Loffler, MD [facility] Pager [phone]

## 2019-05-25 NOTE — Transfer of Care (Signed)
Immediate Anesthesia Transfer of Care Note  Patient: Megan Ortiz  Procedure(s) Performed: ESOPHAGOGASTRODUODENOSCOPY (EGD) WITH PROPOFOL (N/A )  Patient Location: PACU  Anesthesia Type:General  Level of Consciousness: drowsy and patient cooperative  Airway & Oxygen Therapy: Patient Spontanous Breathing  Post-op Assessment: Report given to RN, Post -op Vital signs reviewed and stable and Patient moving all extremities X 4  Post vital signs: Reviewed and stable  Last Vitals:  Vitals Value Taken Time  BP    Temp    Pulse    Resp    SpO2      Last Pain:  Vitals:   05/25/19 1228  TempSrc: Temporal  PainSc: 0-No pain      Patients Stated Pain Goal: 0 (78/24/23 5361)  Complications: No apparent anesthesia complications

## 2019-05-25 NOTE — Anesthesia Procedure Notes (Signed)
Procedure Name: Intubation Date/Time: 05/25/2019 1:03 PM Performed by: Julieta Bellini, CRNA Pre-anesthesia Checklist: Patient identified, Emergency Drugs available, Suction available and Patient being monitored Patient Re-evaluated:Patient Re-evaluated prior to induction Oxygen Delivery Method: Circle system utilized Preoxygenation: Pre-oxygenation with 100% oxygen Induction Type: IV induction and Rapid sequence Laryngoscope Size: Mac and 3 Grade View: Grade I Tube type: Oral Tube size: 7.5 mm Number of attempts: 1 Airway Equipment and Method: Stylet and Oral airway Placement Confirmation: ETT inserted through vocal cords under direct vision,  positive ETCO2 and breath sounds checked- equal and bilateral Secured at: 22 cm Tube secured with: Tape Dental Injury: Teeth and Oropharynx as per pre-operative assessment

## 2019-05-25 NOTE — Progress Notes (Signed)
ANTICOAGULATION CONSULT NOTE - Initial Consult  Pharmacy Consult for heparin  Indication: DVT  Allergies  Allergen Reactions  . Aspirin     Due to hx of stomach ulcers    Patient Measurements: Height: 5\' 5"  (165.1 cm) Weight: (!) 514 lb 12.4 oz (233.5 kg) IBW/kg (Calculated) : 57 Heparin Dosing Weight: 120kg  Vital Signs: Temp: 97.9 F (36.6 C) (07/12 1412) Temp Source: Oral (07/12 1412) BP: 124/63 (07/12 1600) Pulse Rate: 104 (07/12 1347)  Labs: Recent Labs    05/24/19 0629 05/24/19 2045 05/25/19 0718 05/25/19 1511  HGB 7.2* 6.5* 7.1* 8.3*  HCT 27.0* 24.1* 25.9* 29.2*  PLT 386 333 295  --   APTT 26  --   --   --   LABPROT 13.8  --   --   --   INR 1.1  --   --   --   CREATININE 1.35*  --  2.07*  --     Estimated Creatinine Clearance: 69.9 mL/min (A) (by C-G formula based on SCr of 2.07 mg/dL (H)).   Medical History: Past Medical History:  Diagnosis Date  . Anemia   . Anxiety   . Asthma   . Gastric ulcer   . Headache   . Hiatal hernia   . Hypertension   . PE (pulmonary embolism)   . Tachycardia     Medications:  Medications Prior to Admission  Medication Sig Dispense Refill Last Dose  . acetaminophen (TYLENOL) 500 MG tablet Take 500 mg by mouth every 6 (six) hours as needed for moderate pain.     Marland Kitchen albuterol (VENTOLIN HFA) 108 (90 BASE) MCG/ACT inhaler Inhale 2 puffs into the lungs every 6 (six) hours as needed for wheezing.   05/23/2019 at Unknown time  . diltiazem (TIAZAC) 180 MG 24 hr capsule Take 180 mg by mouth daily.   05/23/2019 at Unknown time  . esomeprazole (NEXIUM) 20 MG capsule Take 20 mg by mouth daily at 12 noon.   05/23/2019 at Unknown time  . lisinopril (ZESTRIL) 20 MG tablet Take 20 mg by mouth daily.   05/23/2019 at Unknown time  . montelukast (SINGULAIR) 10 MG tablet Take 10 mg by mouth daily.   05/23/2019 at Unknown time  . nortriptyline (PAMELOR) 25 MG capsule Take 25 mg by mouth at bedtime.   Past Week at Unknown time  . ondansetron  (ZOFRAN) 4 MG tablet Take 1 tablet (4 mg total) by mouth every 8 (eight) hours as needed for nausea or vomiting. 20 tablet 0   . sertraline (ZOLOFT) 100 MG tablet Take 1 tablet (100 mg total) by mouth 2 (two) times daily. 60 tablet 2 05/23/2019 at Unknown time  . topiramate (TOPAMAX) 50 MG tablet Take 50 mg by mouth daily.  11    Scheduled:  . diltiazem  180 mg Oral Daily  . docusate sodium  100 mg Oral BID  . lisinopril  20 mg Oral Daily  . montelukast  10 mg Oral Daily  . nortriptyline  25 mg Oral QHS  . pantoprazole  40 mg Oral BID AC  . perflutren lipid microspheres (DEFINITY) IV suspension      . sertraline  100 mg Oral BID  . sodium chloride flush  10-40 mL Intracatheter Q12H  . sodium chloride flush  3 mL Intravenous Q12H  . topiramate  50 mg Oral Daily    Assessment: 43 yo female with RE DVT (and history of DVT). There was concern for GIB (hg 6.5 on 7/11)  and she is s/p endoscopy with no bleeding found. Pharmacy consulted for heparin.   Goal of Therapy:  Heparin level 0.3-0.5 Monitor platelets by anticoagulation protocol: Yes   Plan:  -No heparin bolus with recent low Hg -Begin heparin at 1600 units/hr -Heparin level in 6 hours and daily wth CBC daily   Hildred Laser, PharmD Clinical Pharmacist **Pharmacist phone directory can now be found on [email or web address] ([email or web address]).  Listed under [place].

## 2019-05-26 ENCOUNTER — Encounter (HOSPITAL_COMMUNITY): Payer: Self-pay | Admitting: Gastroenterology

## 2019-05-26 LAB — COMPREHENSIVE METABOLIC PANEL
ALT: 13 U/L (ref 0–44)
AST: 12 U/L — ABNORMAL LOW (ref 15–41)
Albumin: 3.2 g/dL — ABNORMAL LOW (ref 3.5–5.0)
Alkaline Phosphatase: 68 U/L (ref 38–126)
Anion gap: 13 (ref 5–15)
BUN: 28 mg/dL — ABNORMAL HIGH (ref 6–20)
CO2: 16 mmol/L — ABNORMAL LOW (ref 22–32)
Calcium: 9.8 mg/dL (ref 8.9–10.3)
Chloride: 104 mmol/L (ref 98–111)
Creatinine, Ser: 1.93 mg/dL — ABNORMAL HIGH (ref 0.44–1.00)
GFR calc Af Amer: 36 mL/min — ABNORMAL LOW (ref >60)
GFR calc non Af Amer: 31 mL/min — ABNORMAL LOW (ref >60)
Glucose, Bld: 150 mg/dL — ABNORMAL HIGH (ref 70–99)
Potassium: 4.5 mmol/L (ref 3.5–5.1)
Sodium: 133 mmol/L — ABNORMAL LOW (ref 135–145)
Total Bilirubin: 0.8 mg/dL (ref 0.3–1.2)
Total Protein: 7.7 g/dL (ref 6.5–8.1)

## 2019-05-26 LAB — PROTIME-INR
INR: 1.2 (ref 0.8–1.2)
Prothrombin Time: 14.8 seconds (ref 11.4–15.2)

## 2019-05-26 LAB — CBC WITH DIFFERENTIAL/PLATELET
Abs Immature Granulocytes: 0.18 10*3/uL — ABNORMAL HIGH (ref 0.00–0.07)
Basophils Absolute: 0.1 10*3/uL (ref 0.0–0.1)
Basophils Relative: 1 %
Eosinophils Absolute: 0.5 10*3/uL (ref 0.0–0.5)
Eosinophils Relative: 3 %
HCT: 29.2 % — ABNORMAL LOW (ref 36.0–46.0)
Hemoglobin: 8.1 g/dL — ABNORMAL LOW (ref 12.0–15.0)
Immature Granulocytes: 1 %
Lymphocytes Relative: 12 %
Lymphs Abs: 1.7 10*3/uL (ref 0.7–4.0)
MCH: 21.1 pg — ABNORMAL LOW (ref 26.0–34.0)
MCHC: 27.7 g/dL — ABNORMAL LOW (ref 30.0–36.0)
MCV: 76 fL — ABNORMAL LOW (ref 80.0–100.0)
Monocytes Absolute: 0.8 10*3/uL (ref 0.1–1.0)
Monocytes Relative: 5 %
Neutro Abs: 11.4 10*3/uL — ABNORMAL HIGH (ref 1.7–7.7)
Neutrophils Relative %: 78 %
Platelets: 325 10*3/uL (ref 150–400)
RBC: 3.84 MIL/uL — ABNORMAL LOW (ref 3.87–5.11)
RDW: 21.5 % — ABNORMAL HIGH (ref 11.5–15.5)
WBC: 14.7 10*3/uL — ABNORMAL HIGH (ref 4.0–10.5)
nRBC: 0.4 % — ABNORMAL HIGH (ref 0.0–0.2)

## 2019-05-26 LAB — URINE CULTURE: Culture: NO GROWTH

## 2019-05-26 LAB — HEPARIN LEVEL (UNFRACTIONATED)
Heparin Unfractionated: <0.1 IU/mL — ABNORMAL LOW (ref 0.30–0.70)
Heparin Unfractionated: 0.19 IU/mL — ABNORMAL LOW (ref 0.30–0.70)
Heparin Unfractionated: 0.22 IU/mL — ABNORMAL LOW (ref 0.30–0.70)

## 2019-05-26 LAB — MAGNESIUM: Magnesium: 2.3 mg/dL (ref 1.7–2.4)

## 2019-05-26 MED ORDER — WARFARIN SODIUM 7.5 MG PO TABS
7.5000 mg | ORAL_TABLET | Freq: Once | ORAL | Status: AC
  Administered 2019-05-26: 7.5 mg via ORAL
  Filled 2019-05-26: qty 1

## 2019-05-26 MED ORDER — SODIUM CHLORIDE 0.9 % IV BOLUS
500.0000 mL | Freq: Once | INTRAVENOUS | Status: AC
  Administered 2019-05-26: 500 mL via INTRAVENOUS

## 2019-05-26 MED ORDER — WARFARIN - PHARMACIST DOSING INPATIENT
Freq: Every day | Status: DC
  Administered 2019-05-26 – 2019-05-28 (×3)

## 2019-05-26 MED ORDER — SODIUM BICARBONATE 8.4 % IV SOLN
INTRAVENOUS | Status: DC
  Administered 2019-05-26 – 2019-05-27 (×4): via INTRAVENOUS
  Filled 2019-05-26 (×4): qty 150

## 2019-05-26 NOTE — Progress Notes (Signed)
  RD consulted for nutrition education.   Body mass index is 85.85 kg/m. Pt meets criteria for morbid obesity based on current BMI.  Emphasized the importance of serving sizes and provided examples of correct portions of common foods. Discussed importance of controlled and consistent intake throughout the day. Provided examples of ways to balance meals/snacks and encouraged intake of high-fiber, whole grain complex carbohydrates. Emphasized the importance of hydration with calorie-free beverages and limiting sugar-sweetened beverages. Teach back method used.  Pt reports her and her husband are "foodies." They enjoy shopping at the international market and to try new foods. She typically eats two meals daily that consist of meats, vegetables, and some type of grain. She snacks 3-4 times daily on mostly fruits. She explains she has tried multiple diets and states "at this point surgery is likely the only thing left to try." RD encouraged pt to continue with her balanced meals and meal prepping.   Expect fair compliance.  Current diet order is heart healthy, patient is consuming approximately 85% of meals at this time. Labs and medications reviewed. No further nutrition interventions warranted at this time. RD contact information provided. If additional nutrition issues arise, please re-consult RD.  Mariana Single RD, LDN Clinical Nutrition Pager # - [phone]'

## 2019-05-26 NOTE — Evaluation (Signed)
Physical Therapy Evaluation Patient Details Name: Megan Ortiz MRN: [id number] DOB: 09-29-1976 Today's Date: 05/26/2019   History of Present Illness  Patient is a pleasant 43 year old female with morbid obesity, asthma, hypertension, hiatal hernia, bleeding gastric ulcer over 10 years ago while living in [place], history of PE approximately 10 years ago per patient was on anticoagulation that has subsequently been discontinued, history of necrotizing fasciitis who presented to the ED with complaints of 2 days of right leg pain.  Work-up consistent with extensive right lower extremity DVT from right common femoral vein down to the calf veins.  Patient noted to have a leukocytosis of 18.3, hemoglobin of 7.2.  Has IVC filter.  Also underwent Esophagogastroduodenoscopy.    Clinical Impression  Pt admitted with above diagnosis. Pt currently with functional limitations due to the deficits listed below (see PT Problem List). Pt was able to stand at side of bed with supervision for safety.  Pt moves fairly well. Will have her husband to help at all times.  Should progress well.   Pt will benefit from skilled PT to increase their independence and safety with mobility to allow discharge to the venue listed below.      Follow Up Recommendations Home health PT;Supervision/Assistance - 24 hour(safety eval)    Equipment Recommendations  None recommended by PT    Recommendations for Other Services       Precautions / Restrictions Precautions Precautions: Fall Restrictions Weight Bearing Restrictions: No      Mobility  Bed Mobility Overal bed mobility: Needs Assistance Bed Mobility: Supine to Sit;Sit to Supine     Supine to sit: Independent Sit to supine: Min assist   General bed mobility comments: uses momentum to come to eOB and was able to perform to EOB without assist.  Needed assist to put on socks.  Pt needed assist to get right LE into bed on return to bed.   Transfers Overall transfer  level: Needs assistance Equipment used: None Transfers: Sit to/from Stand Sit to Stand: Supervision         General transfer comment: Pt stood and took a step forward and then back to bed.  Pt states she did not want to walk to door.  States she moves little at home as well.  Did have her stand back up and take steps to [facility] so that she would be high enough in bed.    Ambulation/Gait                Stairs            Wheelchair Mobility    Modified Rankin (Stroke Patients Only)       Balance Overall balance assessment: Needs assistance Sitting-balance support: No upper extremity supported;Feet supported Sitting balance-Leahy Scale: Fair     Standing balance support: No upper extremity supported;During functional activity Standing balance-Leahy Scale: Fair Standing balance comment: No challenges given but pt can stand statically wihtout UE support                              Pertinent Vitals/Pain Pain Assessment: No/denies pain    Home Living Family/patient expects to be discharged to:: Private residence Living Arrangements: Spouse/significant other Available Help at Discharge: Family;Available 24 hours/day Type of Home: House Home Access: Stairs to enter   [employer] of Steps: 1 Home Layout: One level Home Equipment: Cane - single point;Walker - 2 wheels;Hand held shower head(has bariatric RW)  Prior Function Level of Independence: Independent         Comments: Husband assist pt prn in shower but most of time she showered herself.  The dressed herself as well. States they share cleaning and cooking duties.      Hand Dominance        Extremity/Trunk Assessment   Upper Extremity Assessment Upper Extremity Assessment: Defer to OT evaluation    Lower Extremity Assessment Lower Extremity Assessment: Generalized weakness    Cervical / Trunk Assessment Cervical / Trunk Assessment: Normal  Communication    Communication: No difficulties  Cognition Arousal/Alertness: Awake/alert Behavior During Therapy: WFL for tasks assessed/performed Overall Cognitive Status: Within Functional Limits for tasks assessed                                        General Comments      Exercises General Exercises - Lower Extremity Ankle Circles/Pumps: AROM;Both;5 reps;Supine Heel Slides: AROM;Both;5 reps;Supine   Assessment/Plan    PT Assessment Patient needs continued PT services  PT Problem List Decreased activity tolerance;Decreased balance;Decreased mobility;Decreased knowledge of use of DME;Decreased safety awareness;Decreased knowledge of precautions       PT Treatment Interventions DME instruction;Gait training;Functional mobility training;Therapeutic activities;Therapeutic exercise;Balance training;Patient/family education;Stair training    PT Goals (Current goals can be found in the Care Plan section)  Acute Rehab PT Goals Patient Stated Goal: to go home PT Goal Formulation: With patient Time For Goal Achievement: 06/09/19 Potential to Achieve Goals: Good    Frequency Min 3X/week   Barriers to discharge        Co-evaluation               AM-PAC PT "6 Clicks" Mobility  Outcome Measure Help needed turning from your back to your side while in a flat bed without using bedrails?: None Help needed moving from lying on your back to sitting on the side of a flat bed without using bedrails?: None Help needed moving to and from a bed to a chair (including a wheelchair)?: A Little Help needed standing up from a chair using your arms (e.g., wheelchair or bedside chair)?: A Little Help needed to walk in hospital room?: A Lot Help needed climbing 3-5 steps with a railing? : A Lot 6 Click Score: 18    End of Session Equipment Utilized During Treatment: Gait belt Activity Tolerance: Patient limited by fatigue Patient left: in bed;with call bell/phone within reach Nurse  Communication: Mobility status PT Visit Diagnosis: Unsteadiness on feet (R26.81);Muscle weakness (generalized) (M62.81)    Time: 7408-1448 PT Time Calculation (min) (ACUTE ONLY): 15 min   Charges:   PT Evaluation $PT Eval Moderate Complexity: [place] Pager:  [phone]  Office:  [phone]    Denice Paradise 05/26/2019, 10:35 AM

## 2019-05-26 NOTE — Progress Notes (Signed)
ANTICOAGULATION CONSULT NOTE - Follow Up Consult  Pharmacy Consult for Heparin Indication: DVT  Allergies  Allergen Reactions  . Aspirin     Due to hx of stomach ulcers    Patient Measurements: Height: 5\' 5"  (165.1 cm) Weight: (!) 515 lb 14 oz (234 kg) IBW/kg (Calculated) : 57 Heparin Dosing Weight: 120 kg  Vital Signs: Temp: 97.9 F (36.6 C) (07/13 2033) Temp Source: Oral (07/13 2033) BP: 94/35 (07/13 2000) Pulse Rate: 84 (07/13 2033)  Labs: Recent Labs    05/24/19 0629 05/24/19 2045 05/25/19 0718 05/25/19 1511 05/26/19 0046 05/26/19 1033 05/26/19 1905  HGB 7.2* 6.5* 7.1* 8.3* 8.1*  --   --   HCT 27.0* 24.1* 25.9* 29.2* 29.2*  --   --   PLT 386 333 295  --  325  --   --   APTT 26  --   --   --   --   --   --   LABPROT 13.8  --   --   --   --  14.8  --   INR 1.1  --   --   --   --  1.2  --   HEPARINUNFRC  --   --   --   --  <0.10* 0.19* 0.22*  CREATININE 1.35*  --  2.07*  --  1.93*  --   --     Estimated Creatinine Clearance: 75 mL/min (A) (by C-G formula based on SCr of 1.93 mg/dL (H)).  Assessment: 43 yo female with extensive RLE DVT. Pharmacy consulted for heparin dosing.   Heparin level this evening remains SUBtherapeutic despite a rate increase earlier today (HL 0.22 << 0.19, goal of 0.3-0.5). Per discussion with the RN - no issues with the drip or bleeding have been noted.   Goal of Therapy:  INR 2-3 Heparin level 0.3-0.5 units/ml Monitor platelets by anticoagulation protocol: Yes   Plan:  - Increase Heparin to 2650 units/hr (26.5 ml/hr) - Will continue to monitor for any signs/symptoms of bleeding and will follow up with heparin level in 6 hours   Thank you for allowing pharmacy to be a part of this patient's care.  Alycia Rossetti, PharmD, BCPS Clinical Pharmacist Clinical phone for 05/26/2019: [phone] 05/26/2019 9:33 PM   **Pharmacist phone directory can now be found on [email or web address] ([email or web address]).  Listed under [place].

## 2019-05-26 NOTE — Progress Notes (Signed)
PROGRESS NOTE    Megan Ortiz  WVP:[id number] DOB: 1976/08/21 DOA: 05/24/2019 PCP: Larene Beach, MD    Brief Narrative:  Patient is a pleasant 43 year old female with morbid obesity, asthma, hypertension, hiatal hernia, bleeding gastric ulcer over 10 years ago while living in [place], history of PE approximately 10 years ago per patient was on anticoagulation that has subsequently been discontinued, history of necrotizing fasciitis who presented to the ED with complaints of 2 days of right leg pain.  Work-up consistent with extensive right lower extremity DVT from right common femoral vein down to the calf veins.  Patient noted to have a leukocytosis of 18.3, hemoglobin of 7.2.  Patient was sent for CT angiogram at [facility] however due to her size unable to fit into the [facility] subsequently sent to [facility].  CT angiogram chest which was done was negative for PE however did show a large hiatal hernia with dependent density within the hiatal hernia suggestive of active hemorrhage.  Patient noted to have a prior history of IVC filter placement.  Patient initially placed on heparin however due to concerns for GI bleed heparin discontinued.  Patient noted to have a hemoglobin drop as low as 6.5 and being transfused 2 units of packed red blood cells.  Patient also placed empirically on IV antibiotics due to concern for pneumonia noted on chest x-ray on presentation to the ED.  Patient placed on octreotide drip, started on Protonix drip and GI consulted for further evaluation and management.     Assessment & Plan:   Principal Problem:   Leg DVT (deep venous thromboembolism), acute, right (HCC) Active Problems:   AKI (acute kidney injury) ([place])   Benign essential HTN   Morbid Obesity    Adjustment disorder with mixed anxiety and depressed mood   Leukocytosis   Right leg pain   Suspected Pulmonary embolus   Community acquired pneumonia   Positive D dimer   Left lower lobe  pneumonia ([place])   Hypotension   ARF (acute renal failure) (HCC)   DVT (deep venous thrombosis) (HCC)   Anemia   Morbid obesity (HCC)   Abnormal CT scan, stomach  1 acute right extensive DVT Patient noted to have presented with right lower extremity swelling and pain, Doppler ultrasound consistent with an extensive DVT extending from the right common femoral vein down to the calf veins.  CT angiogram chest negative for PE however did show a large hiatal hernia with concerns for gastric hemorrhage.  Patient initially placed on IV heparin which has subsequently been discontinued.  Patient noted to have IVC filter placed.  GI consulted for evaluation of concern for gastric hemorrhage and patient with history of gastric ulcers.  Patient underwent upper endoscopy on 05/25/2019 which showed a large hiatal hernia filled with solid and liquid food, no old or recent blood noted, gastric contents freely reflux to mid esophagus.  Patient subsequently started on IV heparin yesterday.  Will start patient on Coumadin as patient will require 5-day overlap.  Monitor closely for signs of bleeding.   2.  Anemia/GI hemorrhage ruled out. Patient noted to be anemic on admission with hemoglobin of 6.5.  CT angiogram of chest which was done for evaluation for PE noted large hiatal hernia with dependent density within the hiatal hernia suggestive of active hemorrhage.  Patient noted to have hemoglobin ranging from 7.7-8.1 from labs in April 2018.  Heparin discontinued due to concern for GI hemorrhage.  GI consulted patient seen in consultation by  Dr. Ardis Hughs and patient subsequently underwent upper endoscopy  on 05/25/2019 which showed a large hiatal hernia filled with solid and liquid food, no old or recent blood noted, gastric contents freely reflux to mid esophagus.octreotide drip was discontinued yesterday evening.  Protonix drip can be discontinued yesterday evening.  Continue PPI twice daily.  Status post transfusion of 2  units packed red blood cells with hemoglobin currently at 8.1 from 6.5 on admission.  Follow H&H.  Appreciate GI input and recommendations.  3.  Hypotension Patient noted to be hypotensive on admission.  Questionable etiology.  Concern for GI hemorrhage.  CT angiogram of chest which was done was negative for PE however concerning for possible gastric hemorrhage noted within a large hiatal hernia.  Blood pressure improving with hydration and transfusion.  Continue gentle hydration.  ??  Whether right blood pressure cuff.  Patient currently asymptomatic.  Patient underwent upper endoscopy on 05/25/2019 which was negative for any bleed.    4.  Acute renal failure Likely secondary to a prerenal azotemia.  Creatinine at 1.93 from 2.07 from 1.35 on admission.  Patient noted to have some hypotension.  Urine sodium of 48, urine creatinine of 178.46.  Renal ultrasound with degree of size decrease Krupinski between kidneys, significance of finding uncertain could potentially indicate degree of renal artery stenosis on the left in this regard question whether patient is hypertensive otherwise kidneys are unremarkable.  Continue IV fluids.  Patient transfused a total of 2 units packed red blood cells.  Follow.   5. ??  Left lower lobe pneumonia Initially noted on chest x-ray on admission.  CT angiogram chest which was done was negative for any acute infiltrate and PE.  Leukocytosis fluctuating.  Patient with no respiratory symptoms.  COVID-19 negative.  IV vancomycin has been discontinued.  Blood cultures pending.  Continue IV cefepime.    6.  Leukocytosis Questionable etiology.  Likely reactive leukocytosis.  Initial chest x-ray concerning for left lower lobe pneumonia however CT angiogram chest which was done was negative for any acute infiltrate.  Patient with no pulmonary symptoms.  Urinalysis done nitrite negative, leukocytes negative.  Blood cultures pending.  Leukocytosis fluctuating fluctuating.  Patient  currently afebrile.  IV vancomycin is discontinued.  Continue empiric IV cefepime for now pending blood culture results.   7.  Morbid obesity     DVT prophylaxis: Heparin Code Status: Full Family Communication: Updated patient.  No family at bedside. Disposition Plan: Likely back home when clinically improved and work-up is completed and INR therapeutic versus home with Lovenox bridge..   Consultants:   Gastroenterology: Dr. Ardis Hughs 05/25/2019  Procedures:   CT angiogram chest 05/24/2019  2 units packed red blood cell transfusion 05/24/2019>> 05/25/2019  Right lower extremity Doppler 05/24/2019  Chest x-ray 05/24/2019, 05/25/2019  Renal ultrasound 05/24/2019  Upper endoscopy 05/25/2019 per Dr. Ardis Hughs  Antimicrobials:   IV cefepime 05/24/2019  IV vancomycin 05/24/2019>>>>> 05/25/2019   Subjective: Patient laying in bed.  Denies any chest pain or shortness of breath.  No melanotic stools.  No hematemesis.  States had a little bit of emesis early this morning by very minor.  No chest pain.  No shortness of breath.  Feels right upper thigh pain and swelling is slowly improving.  Patient feels she has not been drinking enough oral liquids.  Objective: Vitals:   05/25/19 1700 05/25/19 1950 05/26/19 0400 05/26/19 0808  BP: 115/64 (!) 100/56 105/61 (!) 102/55  Pulse:  (!) 104 89   Resp: (!) 23 Marland Kitchen)  24 17 16   Temp:  98.3 F (36.8 C) 97.8 F (36.6 C) 97.7 F (36.5 C)  TempSrc:  Oral Oral Oral  SpO2: 93% 90% 94% 97%  Weight:   (!) 234 kg   Height:        Intake/Output Summary (Last 24 hours) at 05/26/2019 0953 Last data filed at 05/26/2019 0600 Gross per 24 hour  Intake 3364.78 ml  Output 0 ml  Net 3364.78 ml   Filed Weights   05/24/19 0853 05/26/19 0400  Weight: (!) 233.5 kg (!) 234 kg    Examination:  General exam: No acute distress. Respiratory system: CTA B.  No wheezes, no crackles, no rhonchi.  Normal respiratory effort.  Speaking in full sentences.  Cardiovascular  system: Regular rate rhythm no murmurs rubs or gallops.  No JVD.  Right lower extremity swelling.   Gastrointestinal system: Abdomen is soft, nontender, nondistended, positive bowel sounds.  No rebound.  No guarding.  Central nervous system: Alert and oriented. No focal neurological deficits. Extremities: Right lower extremity swelling greater than left lower extremity.  Symmetric 5 x 5 power. Skin: No rashes, lesions or ulcers Psychiatry: Judgement and insight appear normal. Mood & affect appropriate.     Data Reviewed: I have personally reviewed following labs and imaging studies  CBC: Recent Labs  Lab 05/24/19 0629 05/24/19 2045 05/25/19 0718 05/25/19 1511 05/26/19 0046  WBC 18.3* 14.3* 11.9*  --  14.7*  NEUTROABS 14.9* 10.7* 9.0*  --  11.4*  HGB 7.2* 6.5* 7.1* 8.3* 8.1*  HCT 27.0* 24.1* 25.9* 29.2* 29.2*  MCV 74.8* 72.8* 73.8*  --  76.0*  PLT 386 333 295  --  683   Basic Metabolic Panel: Recent Labs  Lab 05/24/19 0629 05/25/19 0718 05/26/19 0046  NA 133* 133* 133*  K 4.0 4.4 4.5  CL 100 102 104  CO2 21* 19* 16*  GLUCOSE 140* 155* 150*  BUN 19 26* 28*  CREATININE 1.35* 2.07* 1.93*  CALCIUM 10.3 10.1 9.8  MG  --  1.7 2.3   GFR: Estimated Creatinine Clearance: 75 mL/min (A) (by C-G formula based on SCr of 1.93 mg/dL (H)). Liver Function Tests: Recent Labs  Lab 05/24/19 0629 05/25/19 0718 05/26/19 0046  AST 11* 14* 12*  ALT 10 11 13   ALKPHOS 60 67 68  BILITOT 0.5 1.0 0.8  PROT 8.7* 7.4 7.7  ALBUMIN 3.9 3.2* 3.2*   No results for input(s): LIPASE, AMYLASE in the last 168 hours. No results for input(s): AMMONIA in the last 168 hours. Coagulation Profile: Recent Labs  Lab 05/24/19 0629  INR 1.1   Cardiac Enzymes: No results for input(s): CKTOTAL, CKMB, CKMBINDEX, TROPONINI in the last 168 hours. BNP (last 3 results) No results for input(s): PROBNP in the last 8760 hours. HbA1C: No results for input(s): HGBA1C in the last 72 hours. CBG: No results  for input(s): GLUCAP in the last 168 hours. Lipid Profile: No results for input(s): CHOL, HDL, LDLCALC, TRIG, CHOLHDL, LDLDIRECT in the last 72 hours. Thyroid Function Tests: No results for input(s): TSH, T4TOTAL, FREET4, T3FREE, THYROIDAB in the last 72 hours. Anemia Panel: No results for input(s): VITAMINB12, FOLATE, FERRITIN, TIBC, IRON, RETICCTPCT in the last 72 hours. Sepsis Labs: Recent Labs  Lab 05/24/19 0644 05/24/19 0823  LATICACIDVEN 2.9* 1.8    Recent Results (from the past 240 hour(s))  Blood Culture (routine x 2)     Status: None (Preliminary result)   Collection Time: 05/24/19  8:35 AM   Specimen: Left  Antecubital; Blood  Result Value Ref Range Status   Specimen Description LEFT ANTECUBITAL Blood Culture adequate volume  Final   Special Requests BOTTLES DRAWN AEROBIC AND ANAEROBIC  Final   Culture   Final    NO GROWTH < 24 HOURS Performed at [facility], [address], [place], [place] [address]    Report Status PENDING  Incomplete  SARS Coronavirus 2 (CEPHEID - Performed in [place] hospital lab), Hosp Order     Status: None   Collection Time: 05/24/19  8:35 AM   Specimen: Nasopharyngeal Swab  Result Value Ref Range Status   SARS Coronavirus 2 NEGATIVE NEGATIVE Final    Comment: (NOTE) If result is NEGATIVE SARS-CoV-2 target nucleic acids are NOT DETECTED. The SARS-CoV-2 RNA is generally detectable in upper and lower  respiratory specimens during the acute phase of infection. The lowest  concentration of SARS-CoV-2 viral copies this assay can detect is 250  copies / mL. A negative result does not preclude SARS-CoV-2 infection  and should not be used as the sole basis for treatment or other  patient management decisions.  A negative result may occur with  improper specimen collection / handling, submission of specimen other  than nasopharyngeal swab, presence of viral mutation(s) within the  areas targeted by this assay, and inadequate number of viral  copies  (<250 copies / mL). A negative result must be combined with clinical  observations, patient history, and epidemiological information. If result is POSITIVE SARS-CoV-2 target nucleic acids are DETECTED. The SARS-CoV-2 RNA is generally detectable in upper and lower  respiratory specimens dur ing the acute phase of infection.  Positive  results are indicative of active infection with SARS-CoV-2.  Clinical  correlation with patient history and other diagnostic information is  necessary to determine patient infection status.  Positive results do  not rule out bacterial infection or co-infection with other viruses. If result is PRESUMPTIVE POSTIVE SARS-CoV-2 nucleic acids MAY BE PRESENT.   A presumptive positive result was obtained on the submitted specimen  and confirmed on repeat testing.  While 2019 novel coronavirus  (SARS-CoV-2) nucleic acids may be present in the submitted sample  additional confirmatory testing may be necessary for epidemiological  and / or clinical management purposes  to differentiate between  SARS-CoV-2 and other Sarbecovirus currently known to infect humans.  If clinically indicated additional testing with an alternate test  methodology [phone]) is advised. The SARS-CoV-2 RNA is generally  detectable in upper and lower respiratory sp ecimens during the acute  phase of infection. The expected result is Negative. Fact Sheet for Patients:  StrictlyIdeas.no Fact Sheet for Healthcare Providers: BankingDealers.co.za This test is not yet approved or cleared by the [place] FDA and has been authorized for detection and/or diagnosis of SARS-CoV-2 by FDA under an Emergency Use Authorization (EUA).  This EUA will remain in effect (meaning this test can be used) for the duration of the COVID-19 declaration under Section 564(b)(1) of the Act, 21 U.S.C. section 360bbb-3(b)(1), unless the authorization is terminated  or revoked sooner. Performed at [facility], [address], [place], [place] [address]   Blood Culture (routine x 2)     Status: None (Preliminary result)   Collection Time: 05/24/19  8:36 AM   Specimen: BLOOD LEFT HAND  Result Value Ref Range Status   Specimen Description BLOOD LEFT HAND Blood Culture adequate volume  Final   Special Requests BOTTLES DRAWN AEROBIC AND ANAEROBIC  Final   Culture   Final  NO GROWTH < 24 HOURS Performed at [facility], [address] [place], [place] [address]    Report Status PENDING  Incomplete  Urine Culture     Status: None   Collection Time: 05/25/19  3:50 AM   Specimen: Urine, Catheterized  Result Value Ref Range Status   Specimen Description URINE, CATHETERIZED  Final   Special Requests NONE  Final   Culture   Final    NO GROWTH Performed at [facility], [address] [place], [place] [address]    Report Status 05/26/2019 FINAL  Final         Radiology Studies: Ct Angio Chest Pe W Or Wo Contrast  Result Date: 05/24/2019 CLINICAL DATA:  Chest pain EXAM: CT ANGIOGRAPHY CHEST WITH CONTRAST TECHNIQUE: Multidetector CT imaging of the chest was performed using the standard protocol during bolus administration of intravenous contrast. Multiplanar CT image reconstructions and MIPs were obtained to evaluate the vascular anatomy. CONTRAST:  123mL OMNIPAQUE IOHEXOL 350 MG/ML SOLN COMPARISON:  None. FINDINGS: Cardiovascular: The thoracic aorta shows no aneurysmal dilatation or evidence of dissection. No cardiac enlargement is seen. No coronary calcifications are noted. Pulmonary artery shows a normal branching pattern without intraluminal filling defect to suggest pulmonary embolism. Mediastinum/Nodes: Large hiatal hernia is noted with approximately 50% of the stomach within the chest cavity. Thoracic inlet is within normal limits. No hilar or mediastinal adenopathy is noted. Lungs/Pleura: Lungs are well aerated bilaterally. No focal  infiltrate or sizable effusion is seen. No parenchymal nodules are noted. Upper Abdomen: Visualized upper abdomen shows the large hiatal hernia. There is a focus of increased attenuation identified posteriorly best seen on image number 177 of series 7. It would be difficult to exclude the possibility of gastric hemorrhage based on this density. Musculoskeletal: No acute bony abnormality is noted. Review of the MIP images confirms the above findings. IMPRESSION: No evidence of pulmonary emboli. Large hiatal hernia. There is a dependent density within the hiatal hernia suspicious for active hemorrhage. Clinical correlation is recommended. These results will be called to the ordering clinician or representative by the Radiologist Assistant, and communication documented in the PACS or zVision Dashboard. Electronically Signed   By: Inez Catalina M.D.   On: 05/24/2019 19:02   US Renal  Result Date: 05/25/2019 CLINICAL DATA:  Acute renal failure EXAM: RENAL ULTRASOUND COMPARISON:  None. FINDINGS: Right Kidney: Renal measurements: 13.3 x 5.3 x 5.5 cm = volume: 193.80 mL . Echogenicity and renal cortical thickness are within normal limits. No mass, perinephric fluid, or hydronephrosis visualized. No sonographically demonstrable calculus or ureterectasis. Left Kidney: Renal measurements: 10.8 x 5.3 x 5.7 cm = volume: 169.9 mL. Echogenicity and renal cortical thickness are within normal limits. No mass, perinephric fluid, or hydronephrosis visualized. No sonographically demonstrable calculus or ureterectasis. Bladder: Unable to visualize; suspect empty. IMPRESSION: There appears to be a degree of size discrepancy between the kidneys. Significance of this finding is uncertain. This finding potentially could indicate a degree of renal artery stenosis on left. In this regard, question whether patient is hypertensive. Kidneys otherwise appear unremarkable. Urinary bladder not visualized and presumed empty. Electronically Signed    By: Lowella Grip III M.D.   On: 05/25/2019 10:31   Portable Chest 1 View  Result Date: 05/25/2019 CLINICAL DATA:  Pneumonia.  Follow-up exam. EXAM: PORTABLE CHEST 1 VIEW COMPARISON:  CTA chest, 05/24/2019. FINDINGS: Exam limited by the semi-erect, rotated AP technique. The left lung base is obscured. The visualized portions of the  lungs are clear. IMPRESSION: 1. No acute cardiopulmonary disease. Left lower lung is not well assessed on this limited AP study. Electronically Signed   By: Lajean Manes M.D.   On: 05/25/2019 09:01        Scheduled Meds:  diltiazem  180 mg Oral Daily   docusate sodium  100 mg Oral BID   lisinopril  20 mg Oral Daily   montelukast  10 mg Oral Daily   nortriptyline  25 mg Oral QHS   pantoprazole  40 mg Oral BID AC   sertraline  100 mg Oral BID   sodium chloride flush  10-40 mL Intracatheter Q12H   sodium chloride flush  3 mL Intravenous Q12H   topiramate  50 mg Oral Daily   Continuous Infusions:  sodium chloride     ceFEPime (MAXIPIME) IV 2 g (05/26/19 0443)   heparin 2,000 Units/hr (05/26/19 0856)   octreotide  (SANDOSTATIN)    IV infusion 50 mcg/hr (05/26/19 0802)    sodium bicarbonate  infusion 1000 mL 125 mL/hr at 05/26/19 0900     LOS: 2 days    Time spent: 45 minutes    Irine Seal, MD [facility]  If 7PM-7AM, please contact night-coverage [email or web address] 05/26/2019, 9:53 AM

## 2019-05-26 NOTE — TOC Initial Note (Signed)
Transition of Care (TOC) - Initial/Assessment Note  Marvetta Gibbons RN, BSN Transitions of Care Unit 4E- RN Case Manager [phone]   Patient Details  Name: Megan Ortiz MRN: [id number] Date of Birth: [birth year]  Transition of Care [facility]) CM/SW Contact:    Dawayne Patricia, RN Phone Number: 05/26/2019, 2:30 PM  Clinical Narrative:                 Pt admitted with right leg DVT, referral received for coumadin needs/INR checks post transition home. CM spoke with pt at bedside. Pt lives at home with spouse, per pt she has all needed DME at home and states her husband will provide transport home. Pt reports that she has been going to Dr. Loa Socks for PCP needs and in speaking of other options pt reports she would prefer to stay with Dr. Loa Socks for her INR needs- pt reports that he managed her coumadin in the past and is very comfortable with him. CM will plan to call Dr. Madlyn Frankel office once timing known of when INR check is needed post transition (pt prefers an afternoon appointment if available), pt currently remains on IV heparin.  Pt also reports that she pays out of pocket for medications and cost for coumadin should not be a problem.   Expected Discharge Plan: Home/Self Care Barriers to Discharge: Continued Medical Work up   Patient Goals and CMS Choice Patient states their goals for this hospitalization and ongoing recovery are:: "to get rid of these blood clots and never get them again" CMS Medicare.gov Compare Post Acute Care list provided to:: Patient Choice offered to / list presented to : NA  Expected Discharge Plan and Services Expected Discharge Plan: Home/Self Care   Discharge Planning Services: CM Consult Post Acute Care Choice: NA Living arrangements for the past 2 months: Single Family Home                 DME Arranged: N/A DME Agency: NA       HH Arranged: NA HH Agency: NA        Prior Living Arrangements/Services Living arrangements for the past 2  months: Single Family Home Lives with:: Spouse Patient language and need for interpreter reviewed:: Yes Do you feel safe going back to the place where you live?: Yes      Need for Family Participation in Patient Care: Yes (Comment) Care giver support system in place?: Yes (comment) Current home services: DME Criminal Activity/Legal Involvement Pertinent to Current Situation/Hospitalization: No - Comment as needed  Activities of Daily Living      Permission Sought/Granted Permission sought to share information with : Case Manager Permission granted to share information with : Yes, Verbal Permission Granted              Emotional Assessment Appearance:: Appears stated age Attitude/Demeanor/Rapport: Engaged Affect (typically observed): Appropriate, Pleasant Orientation: : Oriented to Self, Oriented to Place, Oriented to  Time, Oriented to Situation Alcohol / Substance Use: Alcohol Use(former smoker) Psych Involvement: No (comment)  Admission diagnosis:  Hypotension, unspecified hypotension type [I95.9] Sepsis, due to unspecified organism, unspecified whether acute organ dysfunction present [facility]) [A41.9] Patient Active Problem List   Diagnosis Date Noted  . ARF (acute renal failure) ([place])   . DVT (deep venous thrombosis) ([place])   . Anemia   . Morbid obesity ([place])   . Abnormal CT scan, stomach   . Leg DVT (deep venous thromboembolism), acute, right ([place]) 05/24/2019  . Right leg pain 05/24/2019  . Suspected  Pulmonary embolus 05/24/2019  . Community acquired pneumonia 05/24/2019  . Positive D dimer 05/24/2019  . Left lower lobe pneumonia ([place]) 05/24/2019  . Hypotension 05/24/2019  . Benign essential HTN   . Uncomplicated asthma   . Morbid Obesity    . Adjustment disorder with mixed anxiety and depressed mood   . Hyperglycemia   . Leukocytosis   . Acute blood loss anemia   . Acute hypoxemic respiratory failure ([place])   . AKI (acute kidney injury) ([place])   . Septic shock  ([place]) 02/16/2017  . Necrotizing soft tissue infection 02/16/2017  . Generalized anxiety disorder 12/22/2014   PCP:  Larene Beach, MD Pharmacy:   [facility], [place] [address] AT [address] [place] [address] Phone: [phone] Fax: [phone]     Social Determinants of Health (SDOH) Interventions    Readmission Risk Interventions No flowsheet data found.

## 2019-05-26 NOTE — Progress Notes (Signed)
Occupational Therapy Evaluation Patient Details Name: Megan Ortiz MRN: [id number] DOB: 27-Mar-1976 Today's Date: 05/26/2019    History of Present Illness Patient is a pleasant 43 year old female with morbid obesity, asthma, hypertension, hiatal hernia, bleeding gastric ulcer over 10 years ago while living in [place], history of PE approximately 10 years ago per patient was on anticoagulation that has subsequently been discontinued, history of necrotizing fasciitis who presented to the ED with complaints of 2 days of right leg pain.  Work-up consistent with extensive right lower extremity DVT from right common femoral vein down to the calf veins.  Patient noted to have a leukocytosis of 18.3, hemoglobin of 7.2.  Has IVC filter.  Also underwent Esophagogastroduodenoscopy.     Clinical Impression   PTA, pt modified independent with ADL and mobility without an AD. Pt complains of RLE "soreness", but moves well. She adapts and has made modifications to her ADL tasks to compensate for her body habitus. Recommend bariatric BSC for use in her room to work on mobility while in the hospital. Will follow acutely to facilitate safe DC home. Do not anticipate need for follow up OT.     Follow Up Recommendations  No OT follow up;Supervision - Intermittent    Equipment Recommendations  None recommended by OT    Recommendations for Other Services       Precautions / Restrictions Precautions Precautions: Fall Restrictions Weight Bearing Restrictions: No      Mobility Bed Mobility Overal bed mobility: Needs Assistance Bed Mobility: Supine to Sit;Sit to Supine     Supine to sit: Independent       Transfers Overall transfer level: Needs assistance Equipment used: None Transfers: Sit to/from Stand Sit to Stand: Supervision              Balance Overall balance assessment: Needs assistance Sitting-balance support: No upper extremity supported;Feet supported Sitting balance-Leahy  Scale: Fair     Standing balance support: No upper extremity supported;During functional activity Standing balance-Leahy Scale: Fair                             ADL either performed or assessed with clinical judgement   ADL Overall ADL's : Needs assistance/impaired                                     Functional mobility during ADLs: Supervision/safety General ADL Comments: Pt states she "has her way" of doing things and her husband assists her if needed. Does not wear socks and only wears slip on shoes; does not wear panties and only wears dresses; has a reacher  that she uses to retrieve items from floor     Vision         Perception     Praxis      Pertinent Vitals/Pain Pain Assessment: Faces Faces Pain Scale: Hurts a little bit Pain Location: RLE Pain Descriptors / Indicators: Sore Pain Intervention(s): Limited activity within patient's tolerance     Hand Dominance Right   Extremity/Trunk Assessment Upper Extremity Assessment Upper Extremity Assessment: Overall WFL for tasks assessed   Lower Extremity Assessment Lower Extremity Assessment: Defer to PT evaluation(Able to move RLE, complains of it being stiff)   Cervical / Trunk Assessment Cervical / Trunk Assessment: Normal   Communication Communication Communication: No difficulties   Cognition Arousal/Alertness: Awake/alert Behavior During Therapy: WFL for tasks assessed/performed Overall  Cognitive Status: Within Functional Limits for tasks assessed                                     General Comments  Educated pt on the availability of Interdry if needed. Pt states she does not have any skin issues at this time    Exercises Exercises: Other exercises Other Exercises Other Exercises: Educated on ankle pumps, elevating RLE adn moving RLE as tolerated to reduce edema   Shoulder Instructions      Home Living Family/patient expects to be discharged to:: Private  residence Living Arrangements: Spouse/significant other Available Help at Discharge: Family;Available 24 hours/day Type of Home: House Home Access: Stairs to enter [employer] of Steps: 1   Home Layout: One level     Bathroom Shower/Tub: Corporate investment banker: Standard Bathroom Accessibility: Yes How Accessible: Accessible via walker Home Equipment: [place] - single point;Walker - 2 wheels;Hand held shower head(has bariatric RW)          Prior Functioning/Environment Level of Independence: Independent        Comments: Husband assist pt prn in shower but most of time she showered herself.  The dressed herself as well. States they share cleaning and cooking duties. Pt does not wear socks or panties and only wears dresses        OT Problem List: Decreased knowledge of use of DME or AE;Obesity;Pain;Decreased range of motion      OT Treatment/Interventions: Self-care/ADL training;Therapeutic exercise;DME and/or AE instruction;Therapeutic activities;Patient/family education    OT Goals(Current goals can be found in the care plan section) Acute Rehab OT Goals Patient Stated Goal: to go home OT Goal Formulation: With patient Time For Goal Achievement: 06/09/19 Potential to Achieve Goals: Good  OT Frequency: Min 2X/week   Barriers to D/C:            Co-evaluation              AM-PAC OT "6 Clicks" Daily Activity     Outcome Measure Help from another person eating meals?: None Help from another person taking care of personal grooming?: A Little Help from another person toileting, which includes using toliet, bedpan, or urinal?: A Little Help from another person bathing (including washing, rinsing, drying)?: A Little Help from another person to put on and taking off regular upper body clothing?: A Little Help from another person to put on and taking off regular lower body clothing?: A Little 6 Click Score: 19   End of Session Nurse  Communication: Mobility status;Other (comment)(Needs bari BSC for room)  Activity Tolerance: Patient tolerated treatment well Patient left: in bed;with call bell/phone within reach  OT Visit Diagnosis: Unsteadiness on feet (R26.81);Muscle weakness (generalized) (M62.81);Pain Pain - Right/Left: Right Pain - part of body: Leg                Time: 7035-0093 OT Time Calculation (min): 17 min Charges:  OT General Charges $OT Visit: 1 Visit OT Evaluation $OT Eval Moderate Complexity: [place], OT/L   Acute OT Clinical Specialist [place] Pager [phone] Office [phone]   [facility] 05/26/2019, 3:01 PM

## 2019-05-26 NOTE — Progress Notes (Signed)
ANTICOAGULATION CONSULT NOTE - Follow Up Consult  Pharmacy Consult for heparin and warfarin Indication: DVT  Allergies  Allergen Reactions  . Aspirin     Due to hx of stomach ulcers    Patient Measurements: Height: 5\' 5"  (165.1 cm) Weight: (!) 515 lb 14 oz (234 kg) IBW/kg (Calculated) : 57 Heparin Dosing Weight: 120 kg  Vital Signs: Temp: 97.7 F (36.5 C) (07/13 0808) Temp Source: Oral (07/13 0808) BP: 102/55 (07/13 0808) Pulse Rate: 89 (07/13 0400)  Labs: Recent Labs    05/24/19 0629 05/24/19 2045 05/25/19 0718 05/25/19 1511 05/26/19 0046 05/26/19 1033  HGB 7.2* 6.5* 7.1* 8.3* 8.1*  --   HCT 27.0* 24.1* 25.9* 29.2* 29.2*  --   PLT 386 333 295  --  325  --   APTT 26  --   --   --   --   --   LABPROT 13.8  --   --   --   --  14.8  INR 1.1  --   --   --   --  1.2  HEPARINUNFRC  --   --   --   --  <0.10* 0.19*  CREATININE 1.35*  --  2.07*  --  1.93*  --     Estimated Creatinine Clearance: 75 mL/min (A) (by C-G formula based on SCr of 1.93 mg/dL (H)).  Assessment: 43 yo female with extensive RLE DVT. Heparin level of 0.19 on heparin 2000 units/hr is not at goal. Will increase heparin by 3-4 units/kg/hr and will not bolus due to previous concern of GI bleed/anemia.   Anticoag:   - Hgb 8.1, plt stable; IVC filter placement.  - CT angio negative for PE - 2 units PRBC on 7/12, Hgb responded  - On 7/11 heparin was DC due to GIB and resumed 7/12 s/p endoscopy indicating no active bleed - Warfarin is preferable due to obesity   Goal of Therapy:  INR 2-3 Heparin level 0.3-0.5 units/ml Monitor platelets by anticoagulation protocol: Yes   Plan:  Inc heparin to 2350 units/hr  Check heparin level at 1830 on 7/13 Monitor daily heparin level and CBC Start warfarin 7.5mg  x1 on 7/13  Check PT/INR daily   Cristela Felt, PharmD PGY1 Pharmacy Resident [employer]: [phone]  05/26/2019,12:17 PM

## 2019-05-26 NOTE — Progress Notes (Signed)
ANTICOAGULATION CONSULT NOTE - Follow Up Consult  Pharmacy Consult for heparin Indication: DVT in setting of anemia  Allergies  Allergen Reactions  . Aspirin     Due to hx of stomach ulcers   Labs: Recent Labs    05/24/19 0629 05/24/19 2045 05/25/19 0718 05/25/19 1511 05/26/19 0046  HGB 7.2* 6.5* 7.1* 8.3* 8.1*  HCT 27.0* 24.1* 25.9* 29.2* 29.2*  PLT 386 333 295  --  325  APTT 26  --   --   --   --   LABPROT 13.8  --   --   --   --   INR 1.1  --   --   --   --   HEPARINUNFRC  --   --   --   --  <0.10*  CREATININE 1.35*  --  2.07*  --  1.93*    Assessment: 43yo female subtherapeutic on heparin with initial dosing for DVT; no gtt issues or signs of bleeding per RN.  Goal of Therapy:  Heparin level 0.3-0.5 units/ml   Plan:  Will increase heparin gtt by 3 units/kgABW/hr to 2000 units/hr and check level in 6 hours.    Wynona Neat, PharmD, BCPS  05/26/2019,4:47 AM

## 2019-05-26 NOTE — Progress Notes (Signed)
Patient has not had any urine output on the shift, bladder scan showed 10cc, night shift reported very little urine out output, Thompson MD paged, in & out cath done, 400 cc of clear yellow colored urine returned , will continue to monitor.

## 2019-05-26 NOTE — Progress Notes (Signed)
Pt voided very little. Bladder scan <20 cc. Pt stated she doesn't feel any pressure and doesn't feel like she has to void. Will continue to monitor.

## 2019-05-27 LAB — CBC WITH DIFFERENTIAL/PLATELET
Abs Immature Granulocytes: 0 10*3/uL (ref 0.00–0.07)
Basophils Absolute: 0 10*3/uL (ref 0.0–0.1)
Basophils Relative: 0 %
Eosinophils Absolute: 0.4 10*3/uL (ref 0.0–0.5)
Eosinophils Relative: 3 %
HCT: 25.4 % — ABNORMAL LOW (ref 36.0–46.0)
Hemoglobin: 7 g/dL — ABNORMAL LOW (ref 12.0–15.0)
Lymphocytes Relative: 15 %
Lymphs Abs: 2 10*3/uL (ref 0.7–4.0)
MCH: 21.1 pg — ABNORMAL LOW (ref 26.0–34.0)
MCHC: 27.6 g/dL — ABNORMAL LOW (ref 30.0–36.0)
MCV: 76.5 fL — ABNORMAL LOW (ref 80.0–100.0)
Monocytes Absolute: 0.1 10*3/uL (ref 0.1–1.0)
Monocytes Relative: 1 %
Neutro Abs: 10.7 10*3/uL — ABNORMAL HIGH (ref 1.7–7.7)
Neutrophils Relative %: 81 %
Platelets: 344 10*3/uL (ref 150–400)
RBC: 3.32 MIL/uL — ABNORMAL LOW (ref 3.87–5.11)
RDW: 21.8 % — ABNORMAL HIGH (ref 11.5–15.5)
WBC: 13.2 10*3/uL — ABNORMAL HIGH (ref 4.0–10.5)
nRBC: 0 /100 WBC
nRBC: 0.3 % — ABNORMAL HIGH (ref 0.0–0.2)

## 2019-05-27 LAB — PROTEIN / CREATININE RATIO, URINE
Creatinine, Urine: 306.04 mg/dL
Protein Creatinine Ratio: 0.1 mg/mg{Cre} (ref 0.00–0.15)
Total Protein, Urine: 32 mg/dL

## 2019-05-27 LAB — COMPREHENSIVE METABOLIC PANEL
ALT: 13 U/L (ref 0–44)
AST: 13 U/L — ABNORMAL LOW (ref 15–41)
Albumin: 2.7 g/dL — ABNORMAL LOW (ref 3.5–5.0)
Alkaline Phosphatase: 58 U/L (ref 38–126)
Anion gap: 11 (ref 5–15)
BUN: 35 mg/dL — ABNORMAL HIGH (ref 6–20)
CO2: 24 mmol/L (ref 22–32)
Calcium: 9.9 mg/dL (ref 8.9–10.3)
Chloride: 99 mmol/L (ref 98–111)
Creatinine, Ser: 2.75 mg/dL — ABNORMAL HIGH (ref 0.44–1.00)
GFR calc Af Amer: 23 mL/min — ABNORMAL LOW (ref >60)
GFR calc non Af Amer: 20 mL/min — ABNORMAL LOW (ref >60)
Glucose, Bld: 131 mg/dL — ABNORMAL HIGH (ref 70–99)
Potassium: 4.3 mmol/L (ref 3.5–5.1)
Sodium: 134 mmol/L — ABNORMAL LOW (ref 135–145)
Total Bilirubin: 0.7 mg/dL (ref 0.3–1.2)
Total Protein: 6.7 g/dL (ref 6.5–8.1)

## 2019-05-27 LAB — URINALYSIS, ROUTINE W REFLEX MICROSCOPIC
Bilirubin Urine: NEGATIVE
Glucose, UA: NEGATIVE mg/dL
Hgb urine dipstick: NEGATIVE
Ketones, ur: 5 mg/dL — AB
Leukocytes,Ua: NEGATIVE
Nitrite: NEGATIVE
Protein, ur: NEGATIVE mg/dL
Specific Gravity, Urine: 1.032 — ABNORMAL HIGH (ref 1.005–1.030)
pH: 5 (ref 5.0–8.0)

## 2019-05-27 LAB — MAGNESIUM: Magnesium: 2.3 mg/dL (ref 1.7–2.4)

## 2019-05-27 LAB — PROTIME-INR
INR: 1.2 (ref 0.8–1.2)
Prothrombin Time: 14.6 seconds (ref 11.4–15.2)

## 2019-05-27 LAB — HEPARIN LEVEL (UNFRACTIONATED)
Heparin Unfractionated: 0.49 IU/mL (ref 0.30–0.70)
Heparin Unfractionated: 0.58 IU/mL (ref 0.30–0.70)

## 2019-05-27 LAB — CREATININE, URINE, RANDOM: Creatinine, Urine: 302.55 mg/dL

## 2019-05-27 LAB — SODIUM, URINE, RANDOM: Sodium, Ur: 11 mmol/L

## 2019-05-27 LAB — HEMOGLOBIN AND HEMATOCRIT, BLOOD
HCT: 29.4 % — ABNORMAL LOW (ref 36.0–46.0)
Hemoglobin: 8.1 g/dL — ABNORMAL LOW (ref 12.0–15.0)

## 2019-05-27 MED ORDER — SODIUM CHLORIDE 0.9 % IV SOLN
510.0000 mg | Freq: Once | INTRAVENOUS | Status: AC
  Administered 2019-05-27: 510 mg via INTRAVENOUS
  Filled 2019-05-27: qty 17

## 2019-05-27 MED ORDER — SODIUM CHLORIDE 0.9 % IV BOLUS
500.0000 mL | Freq: Once | INTRAVENOUS | Status: AC
  Administered 2019-05-27: 500 mL via INTRAVENOUS

## 2019-05-27 MED ORDER — WARFARIN SODIUM 7.5 MG PO TABS
7.5000 mg | ORAL_TABLET | Freq: Once | ORAL | Status: AC
  Administered 2019-05-27: 7.5 mg via ORAL
  Filled 2019-05-27: qty 1

## 2019-05-27 NOTE — Consult Note (Addendum)
[facility]  INPATIENT CONSULTATION  Reason for Consultation: AKI Requesting Provider: Dr. Grandville Silos  HPI: Megan Ortiz is an 43 y.o. female with morbid obesity, asthma, HTN, hiatal hernia, remote h/o PE not on chronic anticoagulation who is seen for evaluation and management of AKI.   Pt initially presented to [facility] last week with 2d h/o RLE pain and swelling.  D dimer elevated, doppler + extensive DVT and she was initiation on anticoagulation. Initial BP in 80/50s. LL infiltrate on CXR so vanc + cefepime initiated. Unable to have CTA at [facility] due to size so she transferred to [facility] and underwent study on 7/11 with 170mL contrast.  She was anemic to 6.5 that day, BPs as low as 90/50s.  CTA negative for PE but had findings concerning for GIB and in setting of anemia GI consulted and heparin gtt held.  EGD without bleeding (food in hiatal hernia).  Rec'd 2u pRBC. Resumed heparin 7/12 and now started coumadin.   Lisinopril 20mg  was stopped 7/13 but today is 1st day not having dose. Today is last day of cefepime, no vanc levels checked.  It appears she rec'd 1g here on 7/11.    She tells me today she had taken a few NSAIDs prior to presentation but not heavy use.  Due to pain and leg swelling wasn't getting around well and knows she was dehydrated on presentation.  She has no systemic symptoms including oral ulcers, new joint pains, rashes, changes to urine character that preceded presentation.  No h/o kidney issues.   Appetite ok.  Able to hydrate here.   Creatinine trend 7/11 1.35 > 7/12 2.07 > 7/13 1.93 > 7/14 2.75.   Net I/Os for hospitalization +10L - UOP 7/13 500.   7/12 UA 1.046, + protein, neg blood.  Una 48, Ucr 178.  7/12 renal US: R 13.3, L 10.8cm, normal echogenicity and cortical thickness.   PMH: Past Medical History:  Diagnosis Date  . Anemia   . Anxiety   . Asthma   . Gastric ulcer   . Headache   . Hiatal hernia   . Hypertension   . PE (pulmonary embolism)   .  Tachycardia    PSH: Past Surgical History:  Procedure Laterality Date  . CESAREAN SECTION    . CHOLECYSTECTOMY    . ESOPHAGOGASTRODUODENOSCOPY (EGD) WITH PROPOFOL N/A 05/25/2019   Procedure: ESOPHAGOGASTRODUODENOSCOPY (EGD) WITH PROPOFOL;  Surgeon: [name], MD;  Location[facility] ENDOSCOPY;  Service: Endoscopy;  Laterality: N/A;  . INCISION AND DRAINAGE ABSCESS Left 02/16/2017   Procedure: DEBRIDEMENT LEFT BUTTOCK ABSCESS;  Surgeon: Fanny Skates, MD;  Location: [place];  Service: General;  Laterality: Left;  . IRRIGATION AND DEBRIDEMENT BUTTOCKS Left 02/19/2017   Procedure: DEBRIDEMENT OF GLUTEAL WOUND;  Surgeon: Rolm Bookbinder, MD;  Location: [place];  Service: General;  Laterality: Left;    Past Medical History:  Diagnosis Date  . Anemia   . Anxiety   . Asthma   . Gastric ulcer   . Headache   . Hiatal hernia   . Hypertension   . PE (pulmonary embolism)   . Tachycardia     Medications:  I have reviewed the patient's current medications. Cefepime, doculsate, feraheme, heparin gtt, nortriptyline, zofran, oxycodone, protonix, zoloft, Bicarb gtt 125/hr, topamax 50, coumadin  Medications Prior to Admission  Medication Sig Dispense Refill  . acetaminophen (TYLENOL) 500 MG tablet Take 500 mg by mouth every 6 (six) hours as needed for moderate pain.    Marland Kitchen albuterol (  VENTOLIN HFA) 108 (90 BASE) MCG/ACT inhaler Inhale 2 puffs into the lungs every 6 (six) hours as needed for wheezing.    . diltiazem (TIAZAC) 180 MG 24 hr capsule Take 180 mg by mouth daily.    Marland Kitchen esomeprazole (NEXIUM) 20 MG capsule Take 20 mg by mouth daily at 12 noon.    Marland Kitchen lisinopril (ZESTRIL) 20 MG tablet Take 20 mg by mouth daily.    . montelukast (SINGULAIR) 10 MG tablet Take 10 mg by mouth daily.    . nortriptyline (PAMELOR) 25 MG capsule Take 25 mg by mouth at bedtime.    . ondansetron (ZOFRAN) 4 MG tablet Take 1 tablet (4 mg total) by mouth every 8 (eight) hours as needed for nausea or vomiting. 20 tablet 0  .  sertraline (ZOLOFT) 100 MG tablet Take 1 tablet (100 mg total) by mouth 2 (two) times daily. 60 tablet 2  . topiramate (TOPAMAX) 50 MG tablet Take 50 mg by mouth daily.  11    ALLERGIES:   Allergies  Allergen Reactions  . Aspirin     Due to hx of stomach ulcers    FAM HX: Family History  Problem Relation Age of Onset  . Depression Mother   . Alcohol abuse Mother     Social History:   reports that she has quit smoking. She has never used smokeless tobacco. She reports current alcohol use. She reports that she does not use drugs.  ROS: 12 system ROS neg except per HPI above  Blood pressure (!) 105/44, pulse 84, temperature 97.9 F (36.6 C), temperature source Oral, resp. rate 14, height 5\' 5"  (1.651 m), weight (!) 241.7 kg, last menstrual period 05/05/2019, SpO2 95 %. PHYSICAL EXAM: Gen: morbidly obese woman comfortable in bed  Eyes:  anicteric ENT: MM tacky, no oral ulcers Neck: supple, thick CV:  RRR Abd:  Soft, obese Lungs: clear, normal WOB GU: foley with amber urine Extr:  RLE about 2x size of LLE with 2+ pitting edema, trace edema LLE Neuro: nonfocal Skin: no rashes or lesion   Results for orders placed or performed during the hospital encounter of 05/24/19 (from the past 48 hour(s))  Hemoglobin and hematocrit, blood     Status: Abnormal   Collection Time: 05/25/19  3:11 PM  Result Value Ref Range   Hemoglobin 8.3 (L) 12.0 - 15.0 g/dL   HCT 29.2 (L) 36.0 - 46.0 %    Comment: Performed at [facility], [address], [place], [place] [address]  Sodium, urine, random     Status: None   Collection Time: 05/25/19  5:50 PM  Result Value Ref Range   Sodium, Ur 48 mmol/L    Comment: Performed at [facility] [address] [place], [place] [address]  Creatinine, urine, random     Status: None   Collection Time: 05/25/19  5:50 PM  Result Value Ref Range   Creatinine, Urine 178.46 mg/dL    Comment: Performed at [facility] [address],  [place], [place] [address]  Comprehensive metabolic panel     Status: Abnormal   Collection Time: 05/26/19 12:46 AM  Result Value Ref Range   Sodium 133 (L) 135 - 145 mmol/L   Potassium 4.5 3.5 - 5.1 mmol/L   Chloride 104 98 - 111 mmol/L   CO2 16 (L) 22 - 32 mmol/L   Glucose, Bld 150 (H) 70 - 99 mg/dL   BUN 28 (H) 6 - 20 mg/dL  Creatinine, Ser 1.93 (H) 0.44 - 1.00 mg/dL   Calcium 9.8 8.9 - 10.3 mg/dL   Total Protein 7.7 6.5 - 8.1 g/dL   Albumin 3.2 (L) 3.5 - 5.0 g/dL   AST 12 (L) 15 - 41 U/L   ALT 13 0 - 44 U/L   Alkaline Phosphatase 68 38 - 126 U/L   Total Bilirubin 0.8 0.3 - 1.2 mg/dL   GFR calc non Af Amer 31 (L) >60 mL/min   GFR calc Af Amer 36 (L) >60 mL/min   Anion gap 13 5 - 15    Comment: Performed at [facility] [address], [place], [place] [address]  Magnesium     Status: None   Collection Time: 05/26/19 12:46 AM  Result Value Ref Range   Magnesium 2.3 1.7 - 2.4 mg/dL    Comment: Performed at [facility] [address] [place], [place] [address]  CBC WITH DIFFERENTIAL     Status: Abnormal   Collection Time: 05/26/19 12:46 AM  Result Value Ref Range   WBC 14.7 (H) 4.0 - 10.5 K/uL   RBC 3.84 (L) 3.87 - 5.11 MIL/uL   Hemoglobin 8.1 (L) 12.0 - 15.0 g/dL    Comment: Reticulocyte Hemoglobin testing may be clinically indicated, consider ordering this additional test [id number]    HCT 29.2 (L) 36.0 - 46.0 %   MCV 76.0 (L) 80.0 - 100.0 fL   MCH 21.1 (L) 26.0 - 34.0 pg   MCHC 27.7 (L) 30.0 - 36.0 g/dL   RDW 21.5 (H) 11.5 - 15.5 %   Platelets 325 150 - 400 K/uL   nRBC 0.4 (H) 0.0 - 0.2 %   Neutrophils Relative % 78 %   Neutro Abs 11.4 (H) 1.7 - 7.7 K/uL   Lymphocytes Relative 12 %   Lymphs Abs 1.7 0.7 - 4.0 K/uL   Monocytes Relative 5 %   Monocytes Absolute 0.8 0.1 - 1.0 K/uL   Eosinophils Relative 3 %   Eosinophils Absolute 0.5 0.0 - 0.5 K/uL   Basophils Relative 1 %   Basophils Absolute 0.1 0.0 - 0.1 K/uL   Immature Granulocytes 1 %   Abs  Immature Granulocytes 0.18 (H) 0.00 - 0.07 K/uL    Comment: Performed at [facility] [address], [place], [place] [address]  Heparin level (unfractionated)     Status: Abnormal   Collection Time: 05/26/19 12:46 AM  Result Value Ref Range   Heparin Unfractionated <0.10 (L) 0.30 - 0.70 IU/mL    Comment: (NOTE) If heparin results are below expected values, and patient dosage has  been confirmed, suggest follow up testing of antithrombin III levels. Performed at [facility], [place] [address], [place], [place] [address]   Heparin level (unfractionated)     Status: Abnormal   Collection Time: 05/26/19 10:33 AM  Result Value Ref Range   Heparin Unfractionated 0.19 (L) 0.30 - 0.70 IU/mL    Comment: (NOTE) If heparin results are below expected values, and patient dosage has  been confirmed, suggest follow up testing of antithrombin III levels. Performed at [facility], [place] [address], [place],  [address]   Protime-INR     Status: None   Collection Time: 05/26/19 10:33 AM  Result Value Ref Range   Prothrombin Time 14.8 11.4 - 15.2 seconds   INR 1.2 0.8 - 1.2    Comment: (NOTE) INR goal varies based on device and disease states. Performed at [facility], [place] [address] [place], [place] [address]   Heparin level (unfractionated)     Status: Abnormal   Collection Time: 05/26/19  7:05 PM  Result Value Ref Range   Heparin Unfractionated 0.22 (L) 0.30 - 0.70 IU/mL    Comment: (NOTE) If heparin results are below expected values, and patient dosage has  been confirmed, suggest follow up testing of antithrombin III levels. Performed at [facility], [place] [address] [place], [place] [address]   Comprehensive metabolic panel     Status: Abnormal   Collection Time: 05/27/19  4:12 AM  Result Value Ref Range   Sodium 134 (L) 135 - 145 mmol/L   Potassium 4.3 3.5 - 5.1 mmol/L   Chloride 99 98 - 111 mmol/L   CO2 24 22 - 32 mmol/L   Glucose, Bld 131  (H) 70 - 99 mg/dL   BUN 35 (H) 6 - 20 mg/dL   Creatinine, Ser 2.75 (H) 0.44 - 1.00 mg/dL   Calcium 9.9 8.9 - 10.3 mg/dL   Total Protein 6.7 6.5 - 8.1 g/dL   Albumin 2.7 (L) 3.5 - 5.0 g/dL   AST 13 (L) 15 - 41 U/L   ALT 13 0 - 44 U/L   Alkaline Phosphatase 58 38 - 126 U/L   Total Bilirubin 0.7 0.3 - 1.2 mg/dL   GFR calc non Af Amer 20 (L) >60 mL/min   GFR calc Af Amer 23 (L) >60 mL/min   Anion gap 11 5 - 15    Comment: Performed at [facility], [place] [address], [place], [place] [address]  Magnesium     Status: None   Collection Time: 05/27/19  4:12 AM  Result Value Ref Range   Magnesium 2.3 1.7 - 2.4 mg/dL    Comment: Performed at [facility] [address], [place], [place] [address]  CBC WITH DIFFERENTIAL     Status: Abnormal   Collection Time: 05/27/19  4:12 AM  Result Value Ref Range   WBC 13.2 (H) 4.0 - 10.5 K/uL   RBC 3.32 (L) 3.87 - 5.11 MIL/uL   Hemoglobin 7.0 (L) 12.0 - 15.0 g/dL    Comment: Reticulocyte Hemoglobin testing may be clinically indicated, consider ordering this additional test [id number]    HCT 25.4 (L) 36.0 - 46.0 %   MCV 76.5 (L) 80.0 - 100.0 fL   MCH 21.1 (L) 26.0 - 34.0 pg   MCHC 27.6 (L) 30.0 - 36.0 g/dL   RDW 21.8 (H) 11.5 - 15.5 %   Platelets 344 150 - 400 K/uL   nRBC 0.3 (H) 0.0 - 0.2 %   Neutrophils Relative % 81 %   Neutro Abs 10.7 (H) 1.7 - 7.7 K/uL   Lymphocytes Relative 15 %   Lymphs Abs 2.0 0.7 - 4.0 K/uL   Monocytes Relative 1 %   Monocytes Absolute 0.1 0.1 - 1.0 K/uL   Eosinophils Relative 3 %   Eosinophils Absolute 0.4 0.0 - 0.5 K/uL   Basophils Relative 0 %   Basophils Absolute 0.0 0.0 - 0.1 K/uL   nRBC 0 0 /100 WBC   Abs Immature Granulocytes 0.00 0.00 - 0.07 K/uL   Tear Drop Cells PRESENT     Comment: Performed at [facility], [place] [address], [place], [place] [address]  Heparin level (unfractionated)     Status: None   Collection Time: 05/27/19  4:12 AM  Result Value Ref Range   Heparin Unfractionated  0.49 0.30 - 0.70 IU/mL    Comment: (NOTE) If heparin results are below expected values, and patient dosage has  been confirmed, suggest follow up testing of antithrombin III levels. Performed at [facility], [place] [address], [place], [place] [address]   Heparin level (unfractionated)     Status: None   Collection Time: 05/27/19  8:52 AM  Result Value Ref Range   Heparin Unfractionated 0.58 0.30 - 0.70 IU/mL    Comment: (NOTE) If heparin results are below expected values, and patient dosage has  been confirmed, suggest follow up testing of antithrombin III levels. Performed at [facility], [place] [address], [place], [place] [address]   Protime-INR     Status: None   Collection Time: 05/27/19  8:52 AM  Result Value Ref Range   Prothrombin Time 14.6 11.4 - 15.2 seconds   INR 1.2 0.8 - 1.2    Comment: (NOTE) INR goal varies based on device and disease states. Performed at [facility], [place] [address], [place],  [address]     No results found.  Assessment/Plan **AKI:  I suspect this is multifactorial AKI with contributions of volume depletion + modest hypotension + contrast administration + anemia.  She has received adequate volume resuscitation and I don't see a need for ongoing fluids in setting of net + 10L and edema as long as she can maintain oral intake - discussed and she will. UA repeat already ordered. Quantify proteinuria (certainly at risk for secondary FSGS with obesity, but time course doesn't fit that).  I think with time and supportive care this will improve.    **Metabolic acidosis: serum bicarb 7/13 was 16; currently on bicarb gtt but holding IVF today.  Will monitor and can start po bicarb if needed should level trend down.   **DVT:  RLE.  On heparin gtt bridging to coumadin.  Has old IVC filter noted on imaging.    **anemia, microcytic:  Hb appears stable after 2u pRBC here. Has chronic anemia in setting of heavy menstrual cycles.  Receiving  IV iron feraheme here as well.   Justin Mend 05/27/2019, 12:26 PM

## 2019-05-27 NOTE — Progress Notes (Signed)
Patient urinate 100 ml,and  refused in and out cath as ordered by K. Schoor. Total Urine output for 14 hours is 100 ml since the last in and out catheterization.Patient was able to get out of the bed to checked her weight.Will continue to monitor.

## 2019-05-27 NOTE — Discharge Instructions (Signed)

## 2019-05-27 NOTE — Progress Notes (Addendum)
PROGRESS NOTE    Megan Ortiz  TZG:[id number] DOB: [birth year] DOA: 05/24/2019 PCP: Larene Beach, MD    Brief Narrative:  Patient is a pleasant 43 year old female with morbid obesity, asthma, hypertension, hiatal hernia, bleeding gastric ulcer over 10 years ago while living in [place], history of PE approximately 10 years ago per patient was on anticoagulation that has subsequently been discontinued, history of necrotizing fasciitis who presented to the ED with complaints of 2 days of right leg pain.  Work-up consistent with extensive right lower extremity DVT from right common femoral vein down to the calf veins.  Patient noted to have a leukocytosis of 18.3, hemoglobin of 7.2.  Patient was sent for CT angiogram at [facility] however due to her size unable to fit into the [place] subsequently sent to [facility].  CT angiogram chest which was done was negative for PE however did show a large hiatal hernia with dependent density within the hiatal hernia suggestive of active hemorrhage.  Patient noted to have a prior history of IVC filter placement.  Patient initially placed on heparin however due to concerns for GI bleed heparin discontinued.  Patient noted to have a hemoglobin drop as low as 6.5 and being transfused 2 units of packed red blood cells.  Patient also placed empirically on IV antibiotics due to concern for pneumonia noted on chest x-ray on presentation to the ED.  Patient placed on octreotide drip, started on Protonix drip and GI consulted for further evaluation and management.     Assessment & Plan:   Principal Problem:   Leg DVT (deep venous thromboembolism), acute, right (HCC) Active Problems:   AKI (acute kidney injury) ([place])   Benign essential HTN   Morbid Obesity    Adjustment disorder with mixed anxiety and depressed mood   Leukocytosis   Right leg pain   Suspected Pulmonary embolus   Community acquired pneumonia   Positive D dimer   Left lower lobe  pneumonia ([place])   Hypotension   ARF (acute renal failure) (HCC)   DVT (deep venous thrombosis) (HCC)   Anemia   Morbid obesity (HCC)   Abnormal CT scan, stomach  1 acute right extensive DVT Patient noted to have presented with right lower extremity swelling and pain, Doppler ultrasound consistent with an extensive DVT extending from the right common femoral vein down to the calf veins.  CT angiogram chest negative for PE however did show a large hiatal hernia with concerns for gastric hemorrhage.  Patient initially placed on IV heparin which has subsequently been discontinued.  Patient noted to have IVC filter placed.  GI consulted for evaluation of concern for gastric hemorrhage and patient with history of gastric ulcers.  Patient underwent upper endoscopy on 05/25/2019 which showed a large hiatal hernia filled with solid and liquid food, no old or recent blood noted, gastric contents freely reflux to mid esophagus.  Patient subsequently started on IV heparin the evening of 05/25/2019.  Coumadin started yesterday per pharmacy.  Patient will need at least 5 days overlap with heparin and Coumadin.  If continued stability could likely transition to Lovenox bridge in order to expedite discharge once renal function has improved.  INR today 1.2.  Monitor closely for signs of bleeding.  2.  Microcytic anemia/GI hemorrhage ruled out. Patient noted to be anemic on admission with hemoglobin of 6.5.  CT angiogram of chest which was done for evaluation for PE noted large hiatal hernia with dependent density within the hiatal hernia suggestive  of active hemorrhage.  Patient noted to have hemoglobin ranging from 7.7-8.1 from labs in April 2018.  Heparin discontinued due to concern for GI hemorrhage.  GI consulted patient seen in consultation by Dr. Ardis Hughs and patient subsequently underwent upper endoscopy  on 05/25/2019 which showed a large hiatal hernia filled with solid and liquid food, no old or recent blood noted,  gastric contents freely reflux to mid esophagus.octreotide drip was discontinued yesterday evening.  Protonix drip discontinued and patient now on oral PPI twice daily.  Status post 2 units packed red blood cells hemoglobin trended back down may be dilutional effect however currently at 7.0 from 8.1 from 6.5 on admission.  Repeat H&H this afternoon.  Transfusion threshold hemoglobin less than 7.  Patient does state has extremely heavy periods and states has been on oral iron supplementation in the past intermittently however due to GI side effects cannot take them on a daily basis.  Patient transfused and as such unable to get an anemia panel at this time.  Will empirically give a dose of IV Feraheme x1.  GI was following but has subsequently signed off.  3.  Hypotension Patient noted to be hypotensive on admission.  Questionable etiology.  Concern for GI hemorrhage.  CT angiogram of chest which was done was negative for PE however concerning for possible gastric hemorrhage noted within a large hiatal hernia.  Blood pressure improving with hydration and transfusion.  Continue gentle hydration.  ?? right blood pressure cuff.  Patient currently asymptomatic.  Patient underwent upper endoscopy on 05/25/2019 which was negative for any bleed.  Lisinopril discontinued yesterday evening.  Will DC diltiazem.  IV fluids.  Follow.  4.  Acute renal failure Likely secondary to a prerenal azotemia.  Creatinine trending back up and now currently at 2.75 from 1.93 from 2.07 from 1.35 on admission.  Patient noted to have some hypotension.  Urine sodium of 48, urine creatinine of 178.46.  Renal ultrasound with degree of size discrepancy between kidneys, significance of finding uncertain could potentially indicate degree of renal artery stenosis on the left in this regard question whether patient is hypertensive otherwise kidneys are unremarkable.  Continue IV fluids.  Patient transfused a total of 2 units packed red blood  cells.  Renal function worsening.  Repeat UA with cultures and sensitivities.  Check a urine sodium.  Check a urine creatinine.  Patient's lisinopril was discontinued yesterday.  Will discontinue diltiazem as blood pressure borderline.  Place Foley catheter.  Strict I's and O's.  Daily weights.  Due to worsening renal function will consult with nephrology for further evaluation and management.  Follow.   5. ??  Left lower lobe pneumonia Initially noted on chest x-ray on admission.  CT angiogram chest which was done was negative for any acute infiltrate and PE.  Leukocytosis fluctuating.  Patient with no respiratory symptoms.  COVID-19 negative.  IV vancomycin has been discontinued.  Blood cultures pending with no growth today.  Will DC IV cefepime after today's doses.  6.  Leukocytosis Questionable etiology.  Likely reactive leukocytosis.  Initial chest x-ray concerning for left lower lobe pneumonia however CT angiogram chest which was done was negative for any acute infiltrate.  Patient with no pulmonary symptoms.  Urinalysis done nitrite negative, leukocytes negative.  Blood cultures pending with no growth to date.  Leukocytosis fluctuating.  Patient afebrile.  IV vancomycin has been discontinued.  Will DC IV cefepime after today's doses.   7.  Morbid obesity  DVT prophylaxis: Heparin Code Status: Full Family Communication: Updated patient.  No family at bedside. Disposition Plan: Likely back home when clinically improved and work-up is completed and INR therapeutic versus home with Lovenox bridge..   Consultants:   Gastroenterology: Dr. Ardis Hughs 05/25/2019  Procedures:   CT angiogram chest 05/24/2019  2 units packed red blood cell transfusion 05/24/2019>> 05/25/2019  Right lower extremity Doppler 05/24/2019  Chest x-ray 05/24/2019, 05/25/2019  Renal ultrasound 05/24/2019  Upper endoscopy 05/25/2019 per Dr. Ardis Hughs  Antimicrobials:   IV cefepime 05/24/2019>>>>> 05/27/2019  IV  vancomycin 05/24/2019>>>>> 05/25/2019   Subjective: Patient in bed.  Denies any chest pain.  Denies any shortness of breath.  Denies any overt bleeding.  States she usually has very heavy menses last menses was about 2 weeks ago.  States try to ambulate and had some swelling in the right leg.  Feels right lower extremity pain and swelling slowly improving.  Patient states has taken oral iron before in the past however has to take it intermittently due to GI side effects.  Objective: Vitals:   05/27/19 0300 05/27/19 0438 05/27/19 0620 05/27/19 0752  BP:  116/89  (!) 105/44  Pulse:      Resp:  16  14  Temp:  98.1 F (36.7 C)  97.9 F (36.6 C)  TempSrc:  Oral  Oral  SpO2: 94% 94%  95%  Weight:   (!) 241.7 kg   Height:        Intake/Output Summary (Last 24 hours) at 05/27/2019 1017 Last data filed at 05/27/2019 0736 Gross per 24 hour  Intake 3607.35 ml  Output 550 ml  Net 3057.35 ml   Filed Weights   05/24/19 0853 05/26/19 0400 05/27/19 0620  Weight: (!) 233.5 kg (!) 234 kg (!) 241.7 kg    Examination:  General exam: No acute distress. Respiratory system: Lung tract auscultation bilaterally.  No wheezes, no crackles, no rhonchi.  Speaking in full sentences.  Normal respiratory effort. Cardiovascular system: RRR no murmurs rubs or gallops.  No JVD.  Right lower extremity swelling.   Gastrointestinal system: Abdomen is obese, soft, nontender, nondistended, positive bowel sounds.  No rebound.  No guarding.  Central nervous system: Alert and oriented. No focal neurological deficits. Extremities: Right lower extremity swelling greater than left lower extremity.  Symmetric 5 x 5 power. Skin: No rashes, lesions or ulcers Psychiatry: Judgement and insight appear normal. Mood & affect appropriate.     Data Reviewed: I have personally reviewed following labs and imaging studies  CBC: Recent Labs  Lab 05/24/19 0629 05/24/19 2045 05/25/19 0718 05/25/19 1511 05/26/19 0046 05/27/19  0412  WBC 18.3* 14.3* 11.9*  --  14.7* 13.2*  NEUTROABS 14.9* 10.7* 9.0*  --  11.4* 10.7*  HGB 7.2* 6.5* 7.1* 8.3* 8.1* 7.0*  HCT 27.0* 24.1* 25.9* 29.2* 29.2* 25.4*  MCV 74.8* 72.8* 73.8*  --  76.0* 76.5*  PLT 386 333 295  --  325 824   Basic Metabolic Panel: Recent Labs  Lab 05/24/19 0629 05/25/19 0718 05/26/19 0046 05/27/19 0412  NA 133* 133* 133* 134*  K 4.0 4.4 4.5 4.3  CL 100 102 104 99  CO2 21* 19* 16* 24  GLUCOSE 140* 155* 150* 131*  BUN 19 26* 28* 35*  CREATININE 1.35* 2.07* 1.93* 2.75*  CALCIUM 10.3 10.1 9.8 9.9  MG  --  1.7 2.3 2.3   GFR: Estimated Creatinine Clearance: 53.9 mL/min (A) (by C-G formula based on SCr of 2.75 mg/dL (H)). Liver Function  Tests: Recent Labs  Lab 05/24/19 0629 05/25/19 0718 05/26/19 0046 05/27/19 0412  AST 11* 14* 12* 13*  ALT 10 11 13 13   ALKPHOS 60 67 68 58  BILITOT 0.5 1.0 0.8 0.7  PROT 8.7* 7.4 7.7 6.7  ALBUMIN 3.9 3.2* 3.2* 2.7*   No results for input(s): LIPASE, AMYLASE in the last 168 hours. No results for input(s): AMMONIA in the last 168 hours. Coagulation Profile: Recent Labs  Lab 05/24/19 0629 05/26/19 1033 05/27/19 0852  INR 1.1 1.2 1.2   Cardiac Enzymes: No results for input(s): CKTOTAL, CKMB, CKMBINDEX, TROPONINI in the last 168 hours. BNP (last 3 results) No results for input(s): PROBNP in the last 8760 hours. HbA1C: No results for input(s): HGBA1C in the last 72 hours. CBG: No results for input(s): GLUCAP in the last 168 hours. Lipid Profile: No results for input(s): CHOL, HDL, LDLCALC, TRIG, CHOLHDL, LDLDIRECT in the last 72 hours. Thyroid Function Tests: No results for input(s): TSH, T4TOTAL, FREET4, T3FREE, THYROIDAB in the last 72 hours. Anemia Panel: No results for input(s): VITAMINB12, FOLATE, FERRITIN, TIBC, IRON, RETICCTPCT in the last 72 hours. Sepsis Labs: Recent Labs  Lab 05/24/19 0644 05/24/19 0823  LATICACIDVEN 2.9* 1.8    Recent Results (from the past 240 hour(s))  Blood  Culture (routine x 2)     Status: None (Preliminary result)   Collection Time: 05/24/19  8:35 AM   Specimen: Left Antecubital; Blood  Result Value Ref Range Status   Specimen Description LEFT ANTECUBITAL Blood Culture adequate volume  Final   Special Requests BOTTLES DRAWN AEROBIC AND ANAEROBIC  Final   Culture   Final    NO GROWTH 3 DAYS Performed at [facility] [address], [place], [place] [address]    Report Status PENDING  Incomplete  SARS Coronavirus 2 (CEPHEID - Performed in [place] hospital lab), Hosp Order     Status: None   Collection Time: 05/24/19  8:35 AM   Specimen: Nasopharyngeal Swab  Result Value Ref Range Status   SARS Coronavirus 2 NEGATIVE NEGATIVE Final    Comment: (NOTE) If result is NEGATIVE SARS-CoV-2 target nucleic acids are NOT DETECTED. The SARS-CoV-2 RNA is generally detectable in upper and lower  respiratory specimens during the acute phase of infection. The lowest  concentration of SARS-CoV-2 viral copies this assay can detect is 250  copies / mL. A negative result does not preclude SARS-CoV-2 infection  and should not be used as the sole basis for treatment or other  patient management decisions.  A negative result may occur with  improper specimen collection / handling, submission of specimen other  than nasopharyngeal swab, presence of viral mutation(s) within the  areas targeted by this assay, and inadequate number of viral copies  (<250 copies / mL). A negative result must be combined with clinical  observations, patient history, and epidemiological information. If result is POSITIVE SARS-CoV-2 target nucleic acids are DETECTED. The SARS-CoV-2 RNA is generally detectable in upper and lower  respiratory specimens dur ing the acute phase of infection.  Positive  results are indicative of active infection with SARS-CoV-2.  Clinical  correlation with patient history and other diagnostic information is  necessary to determine patient  infection status.  Positive results do  not rule out bacterial infection or co-infection with other viruses. If result is PRESUMPTIVE POSTIVE SARS-CoV-2 nucleic acids MAY BE PRESENT.   A presumptive positive result was obtained on the submitted specimen  and confirmed on repeat testing.  While 2019  novel coronavirus  (SARS-CoV-2) nucleic acids may be present in the submitted sample  additional confirmatory testing may be necessary for epidemiological  and / or clinical management purposes  to differentiate between  SARS-CoV-2 and other Sarbecovirus currently known to infect humans.  If clinically indicated additional testing with an alternate test  methodology [phone]) is advised. The SARS-CoV-2 RNA is generally  detectable in upper and lower respiratory sp ecimens during the acute  phase of infection. The expected result is Negative. Fact Sheet for Patients:  StrictlyIdeas.no Fact Sheet for Healthcare Providers: BankingDealers.co.za This test is not yet approved or cleared by the [place] FDA and has been authorized for detection and/or diagnosis of SARS-CoV-2 by FDA under an Emergency Use Authorization (EUA).  This EUA will remain in effect (meaning this test can be used) for the duration of the COVID-19 declaration under Section 564(b)(1) of the Act, 21 U.S.C. section 360bbb-3(b)(1), unless the authorization is terminated or revoked sooner. Performed at [facility], [address] [place], [place] [address]   Blood Culture (routine x 2)     Status: None (Preliminary result)   Collection Time: 05/24/19  8:36 AM   Specimen: BLOOD LEFT HAND  Result Value Ref Range Status   Specimen Description BLOOD LEFT HAND Blood Culture adequate volume  Final   Special Requests BOTTLES DRAWN AEROBIC AND ANAEROBIC  Final   Culture   Final    NO GROWTH 3 DAYS Performed at [facility], [address] [place], [place] [address]    Report  Status PENDING  Incomplete  Urine Culture     Status: None   Collection Time: 05/25/19  3:50 AM   Specimen: Urine, Catheterized  Result Value Ref Range Status   Specimen Description URINE, CATHETERIZED  Final   Special Requests NONE  Final   Culture   Final    NO GROWTH Performed at [facility] [address], [place], [place] [address]    Report Status 05/26/2019 FINAL  Final         Radiology Studies: US Renal  Result Date: 05/25/2019 CLINICAL DATA:  Acute renal failure EXAM: RENAL ULTRASOUND COMPARISON:  None. FINDINGS: Right Kidney: Renal measurements: 13.3 x 5.3 x 5.5 cm = volume: 193.80 mL . Echogenicity and renal cortical thickness are within normal limits. No mass, perinephric fluid, or hydronephrosis visualized. No sonographically demonstrable calculus or ureterectasis. Left Kidney: Renal measurements: 10.8 x 5.3 x 5.7 cm = volume: 169.9 mL. Echogenicity and renal cortical thickness are within normal limits. No mass, perinephric fluid, or hydronephrosis visualized. No sonographically demonstrable calculus or ureterectasis. Bladder: Unable to visualize; suspect empty. IMPRESSION: There appears to be a degree of size discrepancy between the kidneys. Significance of this finding is uncertain. This finding potentially could indicate a degree of renal artery stenosis on left. In this regard, question whether patient is hypertensive. Kidneys otherwise appear unremarkable. Urinary bladder not visualized and presumed empty. Electronically Signed   By: Lowella Grip III M.D.   On: 05/25/2019 10:31        Scheduled Meds: . diltiazem  180 mg Oral Daily  . docusate sodium  100 mg Oral BID  . montelukast  10 mg Oral Daily  . nortriptyline  25 mg Oral QHS  . pantoprazole  40 mg Oral BID AC  . sertraline  100 mg Oral BID  . sodium chloride flush  10-40 mL Intracatheter Q12H  . sodium chloride flush  3 mL Intravenous Q12H  . topiramate  50 mg Oral Daily  . Warfarin -  Pharmacist Dosing Inpatient   Does not apply q1800   Continuous Infusions: . sodium chloride    . ceFEPime (MAXIPIME) IV 2 g (05/27/19 0303)  . heparin 2,650 Units/hr (05/27/19 0630)  .  sodium bicarbonate  infusion 1000 mL 125 mL/hr at 05/27/19 0630     LOS: 3 days    Time spent: 40 minutes    Irine Seal, MD [facility]  If 7PM-7AM, please contact night-coverage [email or web address] 05/27/2019, 10:17 AM

## 2019-05-27 NOTE — Progress Notes (Signed)
ANTICOAGULATION AND ANTIBIOTIC CONSULT NOTE - Follow Up Consult  Pharmacy Consult for heparin, warfarin, and cefepime Indication: DVT, PNA  Allergies  Allergen Reactions  . Aspirin     Due to hx of stomach ulcers    Patient Measurements: Height: 5\' 5"  (165.1 cm) Weight: (!) 532 lb 13.6 oz (241.7 kg) IBW/kg (Calculated) : 57 Heparin Dosing Weight: 120 kg  Vital Signs: Temp: 97.9 F (36.6 C) (07/14 0752) Temp Source: Oral (07/14 0752) BP: 105/44 (07/14 0752)  Labs: Recent Labs    05/25/19 0718 05/25/19 1511  05/26/19 0046 05/26/19 1033 05/26/19 1905 05/27/19 0412 05/27/19 0852  HGB 7.1* 8.3*  --  8.1*  --   --  7.0*  --   HCT 25.9* 29.2*  --  29.2*  --   --  25.4*  --   PLT 295  --   --  325  --   --  344  --   LABPROT  --   --   --   --  14.8  --   --  14.6  INR  --   --   --   --  1.2  --   --  1.2  HEPARINUNFRC  --   --    < > <0.10* 0.19* 0.22* 0.49 0.58  CREATININE 2.07*  --   --  1.93*  --   --  2.75*  --    < > = values in this interval not displayed.    Estimated Creatinine Clearance: 53.9 mL/min (A) (by C-G formula based on SCr of 2.75 mg/dL (H)).   Assessment: 43 yo female with extensive RLE DVT and concern for pneumonia.  Anticoag:  Heparin level 0.58 on heparin 2650 units/hr. INR 1.2 on day 2 after first dose of warfarin 7.5mg . Hgb decreased from 8.1 to 7.0. Plts stable. No signs of active bleeding.    ID: Concerns for PNA on initial chest x-ray. CT negative for infiltrate. WBC decreased from 14.7 to 13.2. Afebrile. On day 4 of cefepime.   7/11 cefepime >>  7/11 vancomycin >> 7/12  Microbiology results: 7/12 Ucx: ngtd 7/11 BCx: ngtd 7/11 UCx: negative 7/11 Covid 19: negative  Goal of Therapy:  INR 2-3 Heparin level 0.3-0.7 units/ml Monitor platelets by anticoagulation protocol: Yes   Plan:  Continue heparin 2650 units/hr Monitor daily heparin level and CBC Warfarin 7.5mg  x1  Monitor for S&S of bleeding  Follow Dr. Biagio Borg plan to  DC cefepime after last dose on 7/14   Cristela Felt, PharmD PGY1 Pharmacy Resident [employer]: [phone]   05/27/2019,11:36 AM

## 2019-05-27 NOTE — Progress Notes (Signed)
ANTICOAGULATION CONSULT NOTE - Follow Up Consult  Pharmacy Consult for Heparin Indication: DVT  Allergies  Allergen Reactions  . Aspirin     Due to hx of stomach ulcers    Patient Measurements: Height: 5\' 5"  (165.1 cm) Weight: (!) 515 lb 14 oz (234 kg) IBW/kg (Calculated) : 57 Heparin Dosing Weight: 120 kg  Vital Signs: Temp: 98.1 F (36.7 C) (07/14 0438) Temp Source: Oral (07/14 0438) BP: 116/89 (07/14 0438) Pulse Rate: 84 (07/13 2033)  Labs: Recent Labs    05/24/19 0629  05/25/19 0718 05/25/19 1511  05/26/19 0046 05/26/19 1033 05/26/19 1905 05/27/19 0412  HGB 7.2*   < > 7.1* 8.3*  --  8.1*  --   --  7.0*  HCT 27.0*   < > 25.9* 29.2*  --  29.2*  --   --  25.4*  PLT 386   < > 295  --   --  325  --   --  344  APTT 26  --   --   --   --   --   --   --   --   LABPROT 13.8  --   --   --   --   --  14.8  --   --   INR 1.1  --   --   --   --   --  1.2  --   --   HEPARINUNFRC  --   --   --   --    < > <0.10* 0.19* 0.22* 0.49  CREATININE 1.35*  --  2.07*  --   --  1.93*  --   --  2.75*   < > = values in this interval not displayed.    Estimated Creatinine Clearance: 52.7 mL/min (A) (by C-G formula based on SCr of 2.75 mg/dL (H)).  Assessment: 43 yo female with extensive RLE DVT. Pharmacy consulted for heparin dosing.   Heparin level 0.49 units/ml  Goal of Therapy:  INR 2-3 Heparin level 0.3-0.5 units/ml Monitor platelets by anticoagulation protocol: Yes   Plan:  - Continue Heparin at 2650 units/hr (26.5 ml/hr) - Will continue to monitor for any signs/symptoms of bleeding  Thanks for allowing pharmacy to be a part of this patient's care.  Excell Seltzer, PharmD Clinical Pharmacist

## 2019-05-28 LAB — CBC WITH DIFFERENTIAL/PLATELET
Abs Immature Granulocytes: 0 10*3/uL (ref 0.00–0.07)
Basophils Absolute: 0.1 10*3/uL (ref 0.0–0.1)
Basophils Relative: 1 %
Eosinophils Absolute: 0.8 10*3/uL — ABNORMAL HIGH (ref 0.0–0.5)
Eosinophils Relative: 6 %
HCT: 26.8 % — ABNORMAL LOW (ref 36.0–46.0)
Hemoglobin: 7.4 g/dL — ABNORMAL LOW (ref 12.0–15.0)
Lymphocytes Relative: 13 %
Lymphs Abs: 1.8 10*3/uL (ref 0.7–4.0)
MCH: 21.1 pg — ABNORMAL LOW (ref 26.0–34.0)
MCHC: 27.6 g/dL — ABNORMAL LOW (ref 30.0–36.0)
MCV: 76.6 fL — ABNORMAL LOW (ref 80.0–100.0)
Monocytes Absolute: 0 10*3/uL — ABNORMAL LOW (ref 0.1–1.0)
Monocytes Relative: 0 %
Neutro Abs: 10.8 10*3/uL — ABNORMAL HIGH (ref 1.7–7.7)
Neutrophils Relative %: 80 %
Platelets: 369 10*3/uL (ref 150–400)
RBC: 3.5 MIL/uL — ABNORMAL LOW (ref 3.87–5.11)
RDW: 22.5 % — ABNORMAL HIGH (ref 11.5–15.5)
WBC: 13.5 10*3/uL — ABNORMAL HIGH (ref 4.0–10.5)
nRBC: 0 /100 WBC
nRBC: 0.5 % — ABNORMAL HIGH (ref 0.0–0.2)

## 2019-05-28 LAB — BLOOD GAS, ARTERIAL
Acid-base deficit: 4 mmol/L — ABNORMAL HIGH (ref 0.0–2.0)
Bicarbonate: 21.3 mmol/L (ref 20.0–28.0)
Drawn by: 34560
FIO2: 1
O2 Saturation: 100 %
Patient temperature: 98.3
pCO2 arterial: 44.2 mmHg (ref 32.0–48.0)
pH, Arterial: 7.304 — ABNORMAL LOW (ref 7.350–7.450)
pO2, Arterial: 382 mmHg — ABNORMAL HIGH (ref 83.0–108.0)

## 2019-05-28 LAB — TYPE AND SCREEN
ABO/RH(D): O POS
Antibody Screen: NEGATIVE
Unit division: 0
Unit division: 0
Unit division: 0
Unit division: 0

## 2019-05-28 LAB — URINE CULTURE: Culture: NO GROWTH

## 2019-05-28 LAB — COMPREHENSIVE METABOLIC PANEL
ALT: 13 U/L (ref 0–44)
AST: 14 U/L — ABNORMAL LOW (ref 15–41)
Albumin: 2.8 g/dL — ABNORMAL LOW (ref 3.5–5.0)
Alkaline Phosphatase: 66 U/L (ref 38–126)
Anion gap: 12 (ref 5–15)
BUN: 41 mg/dL — ABNORMAL HIGH (ref 6–20)
CO2: 22 mmol/L (ref 22–32)
Calcium: 11 mg/dL — ABNORMAL HIGH (ref 8.9–10.3)
Chloride: 99 mmol/L (ref 98–111)
Creatinine, Ser: 3.84 mg/dL — ABNORMAL HIGH (ref 0.44–1.00)
GFR calc Af Amer: 16 mL/min — ABNORMAL LOW (ref >60)
GFR calc non Af Amer: 13 mL/min — ABNORMAL LOW (ref >60)
Glucose, Bld: 110 mg/dL — ABNORMAL HIGH (ref 70–99)
Potassium: 4.3 mmol/L (ref 3.5–5.1)
Sodium: 133 mmol/L — ABNORMAL LOW (ref 135–145)
Total Bilirubin: 0.9 mg/dL (ref 0.3–1.2)
Total Protein: 7.6 g/dL (ref 6.5–8.1)

## 2019-05-28 LAB — BPAM RBC
Blood Product Expiration Date: 202007142359
Blood Product Expiration Date: 202007182359
Blood Product Expiration Date: 202008122359
Blood Product Expiration Date: 202008122359
ISSUE DATE / TIME: 202007120152
ISSUE DATE / TIME: 202007120758
ISSUE DATE / TIME: 202007140532
Unit Type and Rh: 5100
Unit Type and Rh: 5100
Unit Type and Rh: 5100
Unit Type and Rh: 9500

## 2019-05-28 LAB — PROTIME-INR
INR: 1.1 (ref 0.8–1.2)
Prothrombin Time: 14.5 seconds (ref 11.4–15.2)

## 2019-05-28 LAB — CBC
HCT: 26.2 % — ABNORMAL LOW (ref 36.0–46.0)
Hemoglobin: 7.1 g/dL — ABNORMAL LOW (ref 12.0–15.0)
MCH: 21.1 pg — ABNORMAL LOW (ref 26.0–34.0)
MCHC: 27.1 g/dL — ABNORMAL LOW (ref 30.0–36.0)
MCV: 78 fL — ABNORMAL LOW (ref 80.0–100.0)
Platelets: 341 10*3/uL (ref 150–400)
RBC: 3.36 MIL/uL — ABNORMAL LOW (ref 3.87–5.11)
RDW: 22.9 % — ABNORMAL HIGH (ref 11.5–15.5)
WBC: 15 10*3/uL — ABNORMAL HIGH (ref 4.0–10.5)
nRBC: 0.3 % — ABNORMAL HIGH (ref 0.0–0.2)

## 2019-05-28 LAB — GLUCOSE, CAPILLARY
Glucose-Capillary: 68 mg/dL — ABNORMAL LOW (ref 70–99)
Glucose-Capillary: 96 mg/dL (ref 70–99)
Glucose-Capillary: 67 mg/dL — ABNORMAL LOW (ref 70–99)
Glucose-Capillary: 94 mg/dL (ref 70–99)
Glucose-Capillary: 114 mg/dL — ABNORMAL HIGH (ref 70–99)
Glucose-Capillary: 91 mg/dL (ref 70–99)

## 2019-05-28 LAB — ECHOCARDIOGRAM COMPLETE
Height: 65 in
Weight: 8236.39 oz

## 2019-05-28 LAB — BASIC METABOLIC PANEL
Anion gap: 13 (ref 5–15)
BUN: 43 mg/dL — ABNORMAL HIGH (ref 6–20)
CO2: 20 mmol/L — ABNORMAL LOW (ref 22–32)
Calcium: 10.9 mg/dL — ABNORMAL HIGH (ref 8.9–10.3)
Chloride: 98 mmol/L (ref 98–111)
Creatinine, Ser: 4.25 mg/dL — ABNORMAL HIGH (ref 0.44–1.00)
GFR calc Af Amer: 14 mL/min — ABNORMAL LOW (ref >60)
GFR calc non Af Amer: 12 mL/min — ABNORMAL LOW (ref >60)
Glucose, Bld: 150 mg/dL — ABNORMAL HIGH (ref 70–99)
Potassium: 4.2 mmol/L (ref 3.5–5.1)
Sodium: 131 mmol/L — ABNORMAL LOW (ref 135–145)

## 2019-05-28 LAB — HEPARIN LEVEL (UNFRACTIONATED): Heparin Unfractionated: 0.5 IU/mL (ref 0.30–0.70)

## 2019-05-28 MED ORDER — DEXTROSE 50 % IV SOLN
INTRAVENOUS | Status: AC
  Administered 2019-05-28: 25 mL
  Filled 2019-05-28: qty 50

## 2019-05-28 MED ORDER — DEXTROSE 10 % IV SOLN
INTRAVENOUS | Status: DC
  Administered 2019-05-28 – 2019-06-03 (×11): via INTRAVENOUS

## 2019-05-28 MED ORDER — WARFARIN SODIUM 10 MG PO TABS
10.0000 mg | ORAL_TABLET | Freq: Once | ORAL | Status: AC
  Administered 2019-05-28: 10 mg via ORAL
  Filled 2019-05-28: qty 1

## 2019-05-28 MED ORDER — DEXTROSE-NACL 5-0.9 % IV SOLN
INTRAVENOUS | Status: DC
  Administered 2019-05-28: 18:00:00 via INTRAVENOUS

## 2019-05-28 MED ORDER — DEXTROSE 50 % IV SOLN
INTRAVENOUS | Status: AC
  Administered 2019-05-28: 50 mL
  Filled 2019-05-28: qty 50

## 2019-05-28 NOTE — Progress Notes (Signed)
Patient had 50cc urine output since the beginning of this shift, had about 120cc of water intake,  patient encouraged to increase  water intake,water provided in a pitcher, she verbalized understanding,patient in no distress, will continue to monitor and reinforce teachings .

## 2019-05-28 NOTE — Progress Notes (Signed)
Patients urine output since foley was inserted is 130 ml.,40 cc in the last 5 hours.K. Schoor and Dr. Marval Regal (Neprology on call) made aware without orders made.Will continue to monitor.

## 2019-05-28 NOTE — Progress Notes (Signed)
NP paged by RRRN due to hypoglycemic event accompanied by altered mental status and hypoxia. One amp D50 had already been given and NP ordered another 1/2 amp. Changed IVF to D10. Pt's sugar had returned to normal, but she was still somewhat unresponsive. NP went to bedside. When NP arrived, pt was alert. She was talking to this NP. She knew her name and that she is in the hospital.  -hypoglycemia-continue to check sugars. D10 at 75cc/hr.  -hypoxia-resolved. Probably due to low sugar/altered mental status combined with obesity and hypoventilation syndrome. KJKG, NP [facility]

## 2019-05-28 NOTE — Progress Notes (Signed)
Hypoglycemic Event  CBG: 68 @ 1743  Treatment: 294ml of soda Symptoms: patient was confused, drowsy  Follow-up CBG: Time:1610 CBG Result:96  Possible Reasons for Event: inadequate meal intake  Comments/MD notified  [place] Crane MD   See new orders   Renee Rival

## 2019-05-28 NOTE — Progress Notes (Signed)
PROGRESS NOTE  Megan Ortiz PNT:[id number] DOB: 1976-09-24 DOA: 05/24/2019 PCP: Larene Beach, MD  HPI/Recap of past 24 hours: Patient is a pleasant 43 year old female with morbid obesity, asthma, hypertension, hiatal hernia, bleeding gastric ulcer over 10 years ago while living in [place], history of PE approximately 10 years ago per patient was on anticoagulation that has subsequently been discontinued, history of necrotizing fasciitis who presented to the ED with complaints of 2 days of right leg pain.  Work-up consistent with extensive right lower extremity DVT from right common femoral vein down to the calf veins.  Patient noted to have a leukocytosis of 18.3, hemoglobin of 7.2.  Patient was sent for CT angiogram at [facility] however due to her size unable to fit into the [place] subsequently sent to [facility].  CT angiogram chest which was done was negative for PE however did show a large hiatal hernia with dependent density within the hiatal hernia suggestive of active hemorrhage.  Patient noted to have a prior history of IVC filter placement.  Patient initially placed on heparin however due to concerns for GI bleed heparin discontinued.  Patient noted to have a hemoglobin drop as low as 6.5 and being transfused 2 units of packed red blood cells.  Patient also placed empirically on IV antibiotics due to concern for pneumonia noted on chest x-ray on presentation to the ED.  Patient placed on octreotide drip, started on Protonix drip and GI consulted for further evaluation and management.    05/28/19: Patient seen and examined at her bedside.  Reports left lower extremity tenderness from her DVT.  She denies any chest pain, dyspnea, cough or hemoptysis.   Assessment/Plan: Principal Problem:   Leg DVT (deep venous thromboembolism), acute, right (HCC) Active Problems:   AKI (acute kidney injury) ([place])   Benign essential HTN   Morbid Obesity    Adjustment disorder with mixed  anxiety and depressed mood   Leukocytosis   Right leg pain   Suspected Pulmonary embolus   Community acquired pneumonia   Positive D dimer   Left lower lobe pneumonia (HCC)   Hypotension   ARF (acute renal failure) (HCC)   DVT (deep venous thrombosis) (HCC)   Anemia   Morbid obesity (HCC)   Abnormal CT scan, stomach   Acute left lower extremity DVT Currently on Coumadin started on 05/26/2019 and bridged with heparin drip with subtherapeutic INR Pharmacy following Optimize pain control  Hypotension Maintain map greater than 65 Latest MAP 50, unclear if accurate  AKI with oliguria Presented with creatinine of 1.35 and GFR 48 Creatinine continues to trend up 3.84 on 05/28/2019 Nephrology following Avoid nephrotoxins She has minimal urine output recorded of 200 cc in the last 24 hours She has an indwelling Foley catheter in place Repeat BMP in the morning  Microcytic anemia Hemoglobin this morning 7.4 dropped from yesterday of 8.1 No sign of obvious bleeding MCV 76 Obtain iron studies  Generalized anxiety/depression Continue Zoloft  GERD Continue Protonix p.o. 40 mg twice daily  Severe morbid obesity Patient has a BMI of 88 She would greatly benefit from weight loss  Leukocytosis, persistent No sign of active infective process If persistent may consider procalcitonin and lactic acid in the morning Afebrile WBC 13 K from 13 K yesterday  . ??  Left lower lobe pneumonia Initially noted on chest x-ray on admission.  CT angiogram chest which was done was negative for any acute infiltrate and PE.  Leukocytosis fluctuating.  Patient with  no respiratory symptoms.  COVID-19 negative.  IV vancomycin has been discontinued.  Blood cultures no growth.  cefepime DC'd on 05/27/2019.   DVT prophylaxis: Heparin Code Status: Full Family Communication: Updated patient.  No family at bedside. Disposition Plan: Likely back home when clinically improved and work-up is completed and  INR therapeutic versus home with Lovenox bridge..   Consultants:   Gastroenterology: Dr. Ardis Hughs 05/25/2019  Procedures:   CT angiogram chest 05/24/2019  2 units packed red blood cell transfusion 05/24/2019>> 05/25/2019  Right lower extremity Doppler 05/24/2019  Chest x-ray 05/24/2019, 05/25/2019  Renal ultrasound 05/24/2019  Upper endoscopy 05/25/2019 per Dr. Ardis Hughs  Antimicrobials:   IV cefepime 05/24/2019>>>>> 05/27/2019  IV vancomycin 05/24/2019>>>>> 05/25/2019   Objective: Vitals:   05/27/19 1959 05/27/19 2108 05/28/19 0355 05/28/19 0755  BP: (!) 90/39 (!) 99/40 98/82 (!) 101/55  Pulse: 82     Resp: 16 20 19  (!) 24  Temp: 98.5 F (36.9 C)  98.3 F (36.8 C) 97.7 F (36.5 C)  TempSrc: Oral  Oral Oral  SpO2:   94% 99%  Weight:      Height:        Intake/Output Summary (Last 24 hours) at 05/28/2019 1118 Last data filed at 05/28/2019 0656 Gross per 24 hour  Intake 941.64 ml  Output 100 ml  Net 841.64 ml   Filed Weights   05/24/19 0853 05/26/19 0400 05/27/19 0620  Weight: (!) 233.5 kg (!) 234 kg (!) 241.7 kg    Exam:   General: 43 y.o. year-old female severe morbid obesity alert and oriented x2.   Cardiovascular: Regular rate and rhythm with no rubs or gallops.  No thyromegaly or JVD noted.    Respiratory: Clear to auscultation with no wheezes or rales. Good inspiratory effort.  Abdomen: Severe morbid obesity with hypoactive bowel sounds.  Nontender.  Psychiatry: Mood is appropriate for condition and setting   Data Reviewed: CBC: Recent Labs  Lab 05/24/19 2045 05/25/19 0718 05/25/19 1511 05/26/19 0046 05/27/19 0412 05/27/19 1458 05/28/19 0343  WBC 14.3* 11.9*  --  14.7* 13.2*  --  13.5*  NEUTROABS 10.7* 9.0*  --  11.4* 10.7*  --  10.8*  HGB 6.5* 7.1* 8.3* 8.1* 7.0* 8.1* 7.4*  HCT 24.1* 25.9* 29.2* 29.2* 25.4* 29.4* 26.8*  MCV 72.8* 73.8*  --  76.0* 76.5*  --  76.6*  PLT 333 295  --  325 344  --  182   Basic Metabolic Panel: Recent Labs  Lab  05/24/19 0629 05/25/19 0718 05/26/19 0046 05/27/19 0412 05/28/19 0343  NA 133* 133* 133* 134* 133*  K 4.0 4.4 4.5 4.3 4.3  CL 100 102 104 99 99  CO2 21* 19* 16* 24 22  GLUCOSE 140* 155* 150* 131* 110*  BUN 19 26* 28* 35* 41*  CREATININE 1.35* 2.07* 1.93* 2.75* 3.84*  CALCIUM 10.3 10.1 9.8 9.9 11.0*  MG  --  1.7 2.3 2.3  --    GFR: Estimated Creatinine Clearance: 38.6 mL/min (A) (by C-G formula based on SCr of 3.84 mg/dL (H)). Liver Function Tests: Recent Labs  Lab 05/24/19 0629 05/25/19 0718 05/26/19 0046 05/27/19 0412 05/28/19 0343  AST 11* 14* 12* 13* 14*  ALT 10 11 13 13 13   ALKPHOS 60 67 68 58 66  BILITOT 0.5 1.0 0.8 0.7 0.9  PROT 8.7* 7.4 7.7 6.7 7.6  ALBUMIN 3.9 3.2* 3.2* 2.7* 2.8*   No results for input(s): LIPASE, AMYLASE in the last 168 hours. No results for input(s): AMMONIA  in the last 168 hours. Coagulation Profile: Recent Labs  Lab 05/24/19 0629 05/26/19 1033 05/27/19 0852 05/28/19 0343  INR 1.1 1.2 1.2 1.1   Cardiac Enzymes: No results for input(s): CKTOTAL, CKMB, CKMBINDEX, TROPONINI in the last 168 hours. BNP (last 3 results) No results for input(s): PROBNP in the last 8760 hours. HbA1C: No results for input(s): HGBA1C in the last 72 hours. CBG: No results for input(s): GLUCAP in the last 168 hours. Lipid Profile: No results for input(s): CHOL, HDL, LDLCALC, TRIG, CHOLHDL, LDLDIRECT in the last 72 hours. Thyroid Function Tests: No results for input(s): TSH, T4TOTAL, FREET4, T3FREE, THYROIDAB in the last 72 hours. Anemia Panel: No results for input(s): VITAMINB12, FOLATE, FERRITIN, TIBC, IRON, RETICCTPCT in the last 72 hours. Urine analysis:    Component Value Date/Time   COLORURINE AMBER (A) 05/27/2019 1300   APPEARANCEUR CLOUDY (A) 05/27/2019 1300   LABSPEC 1.032 (H) 05/27/2019 1300   PHURINE 5.0 05/27/2019 1300   GLUCOSEU NEGATIVE 05/27/2019 1300   HGBUR NEGATIVE 05/27/2019 1300   BILIRUBINUR NEGATIVE 05/27/2019 1300   KETONESUR 5  (A) 05/27/2019 1300   PROTEINUR NEGATIVE 05/27/2019 1300   UROBILINOGEN 0.2 05/11/2013 1903   NITRITE NEGATIVE 05/27/2019 1300   LEUKOCYTESUR NEGATIVE 05/27/2019 1300   Sepsis Labs: @LABRCNTIP (procalcitonin:4,lacticidven:4)  ) Recent Results (from the past 240 hour(s))  Blood Culture (routine x 2)     Status: None (Preliminary result)   Collection Time: 05/24/19  8:35 AM   Specimen: Left Antecubital; Blood  Result Value Ref Range Status   Specimen Description LEFT ANTECUBITAL Blood Culture adequate volume  Final   Special Requests BOTTLES DRAWN AEROBIC AND ANAEROBIC  Final   Culture   Final    NO GROWTH 4 DAYS Performed at [facility], [address] [place], [place] [address]    Report Status PENDING  Incomplete  SARS Coronavirus 2 (CEPHEID - Performed in [place] hospital lab), Hosp Order     Status: None   Collection Time: 05/24/19  8:35 AM   Specimen: Nasopharyngeal Swab  Result Value Ref Range Status   SARS Coronavirus 2 NEGATIVE NEGATIVE Final    Comment: (NOTE) If result is NEGATIVE SARS-CoV-2 target nucleic acids are NOT DETECTED. The SARS-CoV-2 RNA is generally detectable in upper and lower  respiratory specimens during the acute phase of infection. The lowest  concentration of SARS-CoV-2 viral copies this assay can detect is 250  copies / mL. A negative result does not preclude SARS-CoV-2 infection  and should not be used as the sole basis for treatment or other  patient management decisions.  A negative result may occur with  improper specimen collection / handling, submission of specimen other  than nasopharyngeal swab, presence of viral mutation(s) within the  areas targeted by this assay, and inadequate number of viral copies  (<250 copies / mL). A negative result must be combined with clinical  observations, patient history, and epidemiological information. If result is POSITIVE SARS-CoV-2 target nucleic acids are DETECTED. The SARS-CoV-2 RNA is generally  detectable in upper and lower  respiratory specimens dur ing the acute phase of infection.  Positive  results are indicative of active infection with SARS-CoV-2.  Clinical  correlation with patient history and other diagnostic information is  necessary to determine patient infection status.  Positive results do  not rule out bacterial infection or co-infection with other viruses. If result is PRESUMPTIVE POSTIVE SARS-CoV-2 nucleic acids MAY BE PRESENT.   A presumptive positive result was obtained on the submitted  specimen  and confirmed on repeat testing.  While 2019 novel coronavirus  (SARS-CoV-2) nucleic acids may be present in the submitted sample  additional confirmatory testing may be necessary for epidemiological  and / or clinical management purposes  to differentiate between  SARS-CoV-2 and other Sarbecovirus currently known to infect humans.  If clinically indicated additional testing with an alternate test  methodology [phone]) is advised. The SARS-CoV-2 RNA is generally  detectable in upper and lower respiratory sp ecimens during the acute  phase of infection. The expected result is Negative. Fact Sheet for Patients:  StrictlyIdeas.no Fact Sheet for Healthcare Providers: BankingDealers.co.za This test is not yet approved or cleared by the [place] FDA and has been authorized for detection and/or diagnosis of SARS-CoV-2 by FDA under an Emergency Use Authorization (EUA).  This EUA will remain in effect (meaning this test can be used) for the duration of the COVID-19 declaration under Section 564(b)(1) of the Act, 21 U.S.C. section 360bbb-3(b)(1), unless the authorization is terminated or revoked sooner. Performed at [facility], [address], [place], [place] [address]   Blood Culture (routine x 2)     Status: None (Preliminary result)   Collection Time: 05/24/19  8:36 AM   Specimen: BLOOD LEFT HAND  Result Value Ref  Range Status   Specimen Description BLOOD LEFT HAND Blood Culture adequate volume  Final   Special Requests BOTTLES DRAWN AEROBIC AND ANAEROBIC  Final   Culture   Final    NO GROWTH 4 DAYS Performed at [facility], [address], [place], [place] [address]    Report Status PENDING  Incomplete  Urine Culture     Status: None   Collection Time: 05/25/19  3:50 AM   Specimen: Urine, Catheterized  Result Value Ref Range Status   Specimen Description URINE, CATHETERIZED  Final   Special Requests NONE  Final   Culture   Final    NO GROWTH Performed at [facility] [address], [place], [place] [address]    Report Status 05/26/2019 FINAL  Final  Urine Culture     Status: None   Collection Time: 05/27/19  8:12 AM   Specimen: Urine, Random  Result Value Ref Range Status   Specimen Description URINE, RANDOM  Final   Special Requests NONE  Final   Culture   Final    NO GROWTH Performed at [facility], [place] [address] [place], [place] [address]    Report Status 05/28/2019 FINAL  Final      Studies: No results found.  Scheduled Meds:  docusate sodium  100 mg Oral BID   montelukast  10 mg Oral Daily   nortriptyline  25 mg Oral QHS   pantoprazole  40 mg Oral BID AC   sertraline  100 mg Oral BID   sodium chloride flush  10-40 mL Intracatheter Q12H   sodium chloride flush  3 mL Intravenous Q12H   topiramate  50 mg Oral Daily   warfarin  10 mg Oral ONCE-1800   Warfarin - Pharmacist Dosing Inpatient   Does not apply q1800    Continuous Infusions:  sodium chloride     heparin 2,650 Units/hr (05/28/19 0646)     LOS: 4 days     Kayleen Memos, MD [facility] Pager [phone]  If 7PM-7AM, please contact night-coverage [email or web address] Password [email or web address] 05/28/2019, 11:18 AM

## 2019-05-28 NOTE — Progress Notes (Addendum)
[facility] Progress Note   Subjective:   Reviewing notes decreased LOC yesterday, poor po intake.  Overnight episode of hypoxia, hypoglycemia, AMS.  She was given dextrose and placed on D10.   I/Os 2.0/  70mL UOP yesterday  Objective Vitals:   05/27/19 2108 05/28/19 0355 05/28/19 0755 05/28/19 1134  BP: (!) 99/40 98/82 (!) 101/55 (!) 101/30  Pulse:    80  Resp: 20 19 (!) 24 12  Temp:  98.3 F (36.8 C) 97.7 F (36.5 C) 97.9 F (36.6 C)  TempSrc:  Oral Oral Oral  SpO2:  94% 99% 94%  Weight:      Height:       Physical Exam Gen: morbidly obese woman in bed in no distress but really only responding to pain Eyes:  anicteric ENT: MM tacky, no oral ulcers, poor dentition Neck: supple, thick CV:  RRR Abd:  Soft, obese Lungs: clear, normal WOB GU: foley with amber urine, appears to be draining Extr:  RLE about 2x size of LLE with 2+ pitting edema, trace edema LLE Neuro: responds to pain and RN says she was saying curse words when IV was placed but she will not answer any questions for me or follow commands Skin: no rashes or lesion  Additional Objective Labs: Basic Metabolic Panel: Recent Labs  Lab 05/26/19 0046 05/27/19 0412 05/28/19 0343  NA 133* 134* 133*  K 4.5 4.3 4.3  CL 104 99 99  CO2 16* 24 22  GLUCOSE 150* 131* 110*  BUN 28* 35* 41*  CREATININE 1.93* 2.75* 3.84*  CALCIUM 9.8 9.9 11.0*   Liver Function Tests: Recent Labs  Lab 05/26/19 0046 05/27/19 0412 05/28/19 0343  AST 12* 13* 14*  ALT 13 13 13   ALKPHOS 68 58 66  BILITOT 0.8 0.7 0.9  PROT 7.7 6.7 7.6  ALBUMIN 3.2* 2.7* 2.8*   No results for input(s): LIPASE, AMYLASE in the last 168 hours. CBC: Recent Labs  Lab 05/24/19 2045 05/25/19 0718  05/26/19 0046 05/27/19 0412 05/27/19 1458 05/28/19 0343  WBC 14.3* 11.9*  --  14.7* 13.2*  --  13.5*  NEUTROABS 10.7* 9.0*  --  11.4* 10.7*  --  10.8*  HGB 6.5* 7.1*   < > 8.1* 7.0* 8.1* 7.4*  HCT 24.1* 25.9*   < > 29.2* 25.4* 29.4*  26.8*  MCV 72.8* 73.8*  --  76.0* 76.5*  --  76.6*  PLT 333 295  --  325 344  --  369   < > = values in this interval not displayed.   Blood Culture    Component Value Date/Time   SDES URINE, RANDOM 05/27/2019 0812   SPECREQUEST NONE 05/27/2019 0812   CULT  05/27/2019 0812    NO GROWTH Performed at [facility], [place] [address] [place], [place] [address]    REPTSTATUS 05/28/2019 FINAL 05/27/2019 7253    Cardiac Enzymes: No results for input(s): CKTOTAL, CKMB, CKMBINDEX, TROPONINI in the last 168 hours. CBG: No results for input(s): GLUCAP in the last 168 hours. Iron Studies: No results for input(s): IRON, TIBC, TRANSFERRIN, FERRITIN in the last 72 hours. @lablastinr3 @ Studies/Results: No results found. Medications: . sodium chloride    . heparin 2,650 Units/hr (05/28/19 0646)   . docusate sodium  100 mg Oral BID  . montelukast  10 mg Oral Daily  . nortriptyline  25 mg Oral QHS  . pantoprazole  40 mg Oral BID AC  . sertraline  100 mg Oral BID  .  sodium chloride flush  10-40 mL Intracatheter Q12H  . sodium chloride flush  3 mL Intravenous Q12H  . topiramate  50 mg Oral Daily  . warfarin  10 mg Oral ONCE-1800  . Warfarin - Pharmacist Dosing Inpatient   Does not apply q1800    Assessment/Plan: **AKI:  I suspect this is multifactorial AKI with contributions of volume depletion + modest hypotension + contrast administration + anemia.  UA repeat shows no blood or protein, Up/C 0.1.  Her renal US did show discrepant size of kidneys (~13 and ~10cm) but I don't this this is causing an acute issue -- likely it's congenital or at least a chronic issue; no indication for RAS stenosis at this time).    In light of poor po intake and AMS she's receiving additional IV NS boluses this AM and I've added NS 125/hr through the day.  Will recheck a FeNa today in light of oliguria - may support additional IVF administration.   **AMS:  Really only responding to pain at this time.  Not  uremia - BUN only in 40s.  No opioids since 7/14.  D/w primary - she plans to obtain ABG, considering head CT given anticoagulation.    **Metabolic acidosis: resolved, now off bicarb gtt.  CTM.   **DVT:  RLE.  On heparin gtt bridging to coumadin.  In light of worsening renal function and possible need for catheter placement will hold coumadin; d/w hospitalist.  Has old IVC filter noted on imaging.    **anemia, microcytic:  Hb appears stable after 2u pRBC here. Has chronic anemia in setting of heavy menstrual cycles.  Receiving IV iron feraheme here as well.  No indication for ESA given this is AKI.    **super morbid obesity: BMI 89.   Jannifer Hick MD 05/28/2019, 12:19 PM  [facility] Pager: [phone]

## 2019-05-28 NOTE — Progress Notes (Signed)
Physical Therapy Treatment Patient Details Name: Megan Ortiz MRN: [id number] DOB: 07-09-1976 Today's Date: 05/28/2019    History of Present Illness Patient is a pleasant 43 year old female with morbid obesity, asthma, hypertension, hiatal hernia, bleeding gastric ulcer over 10 years ago while living in [place], history of PE approximately 10 years ago per patient was on anticoagulation that has subsequently been discontinued, history of necrotizing fasciitis who presented to the ED with complaints of 2 days of right leg pain.  Work-up consistent with extensive right lower extremity DVT from right common femoral vein down to the calf veins.  Patient noted to have a leukocytosis of 18.3, hemoglobin of 7.2.  Has IVC filter.  Also underwent Esophagogastroduodenoscopy.      PT Comments    Pt found supine in bed with container of salad on chest eating pieces of lettuce. Pt with minimal response to questions from PT. PT encouraged pt to get to recliner to eat, however pt refused. Pt eventually agreed to get to EoB to eat. Pt then came to longsitting in bed. Attempted to move legs and then laid back down. With increased encouragement pt again came to longsitting and then became very tearful and laid back down. Pt reports she "I don't know what is happening to me.". Pt came to longsitting again and again laid back down. On 4th attempt pt rolled over on side and propped herself up and reached for bedside table. PT set up lunch and placed pillows behind back for support.   Follow Up Recommendations  Home health PT;Supervision/Assistance - 24 hour(safety eval)     Equipment Recommendations  None recommended by PT       Precautions / Restrictions Precautions Precautions: Fall Restrictions Weight Bearing Restrictions: No    Mobility  Bed Mobility Overal bed mobility: Needs Assistance Bed Mobility: Supine to Sit     Supine to sit: Independent     General bed mobility comments: independent with  longsitting in bed x4 refused additional movement to EoB                             Balance Overall balance assessment: Needs assistance Sitting-balance support: No upper extremity supported;Feet supported Sitting balance-Leahy Scale: Fair                                      Cognition Arousal/Alertness: Awake/alert Behavior During Therapy: WFL for tasks assessed/performed Overall Cognitive Status: Difficult to assess                                 General Comments: slow to respond to questions, tearful, repeating "I don't know what's happening."         General Comments  VSS      Pertinent Vitals/Pain Pain Assessment: Faces Faces Pain Scale: Hurts a little bit Pain Location: RLE Pain Descriptors / Indicators: Sore Pain Intervention(s): Monitored during session;Repositioned           PT Goals (current goals can now be found in the care plan section) Acute Rehab PT Goals Patient Stated Goal: to go home PT Goal Formulation: With patient Time For Goal Achievement: 06/09/19 Potential to Achieve Goals: Good Progress towards PT goals: PT to reassess next treatment    Frequency    Min 3X/week      PT  Plan Current plan remains appropriate       AM-PAC PT "6 Clicks" Mobility   Outcome Measure  Help needed turning from your back to your side while in a flat bed without using bedrails?: None Help needed moving from lying on your back to sitting on the side of a flat bed without using bedrails?: None Help needed moving to and from a bed to a chair (including a wheelchair)?: A Little Help needed standing up from a chair using your arms (e.g., wheelchair or bedside chair)?: A Little Help needed to walk in hospital room?: A Lot Help needed climbing 3-5 steps with a railing? : A Lot 6 Click Score: 18    End of Session Equipment Utilized During Treatment: Gait belt Activity Tolerance: Patient limited by fatigue Patient  left: in bed;with call bell/phone within reach Nurse Communication: Mobility status PT Visit Diagnosis: Unsteadiness on feet (R26.81);Muscle weakness (generalized) (M62.81)     Time: 8616-8372 PT Time Calculation (min) (ACUTE ONLY): 10 min  Charges:  $Therapeutic Activity: 8-22 mins                     Jaylean Buenaventura B. Migdalia Dk PT, DPT Acute Rehabilitation Services Pager [phone] Office [phone]    [place] 05/28/2019, 5:30 PM

## 2019-05-28 NOTE — Progress Notes (Signed)
ANTICOAGULATION AND ANTIBIOTIC CONSULT NOTE - Follow Up Consult  Pharmacy Consult for heparin, warfarin, and cefepime Indication: DVT, PNA  Allergies  Allergen Reactions  . Aspirin     Due to hx of stomach ulcers    Patient Measurements: Height: 5\' 5"  (165.1 cm) Weight: (!) 532 lb 13.6 oz (241.7 kg) IBW/kg (Calculated) : 57 Heparin Dosing Weight: 120 kg  Vital Signs: Temp: 97.7 F (36.5 C) (07/15 0755) Temp Source: Oral (07/15 0755) BP: 101/55 (07/15 0755)  Labs: Recent Labs    05/26/19 0046 05/26/19 1033  05/27/19 0412 05/27/19 0852 05/27/19 1458 05/28/19 0343  HGB 8.1*  --   --  7.0*  --  8.1* 7.4*  HCT 29.2*  --   --  25.4*  --  29.4* 26.8*  PLT 325  --   --  344  --   --  369  LABPROT  --  14.8  --   --  14.6  --  14.5  INR  --  1.2  --   --  1.2  --  1.1  HEPARINUNFRC <0.10* 0.19*   < > 0.49 0.58  --  0.50  CREATININE 1.93*  --   --  2.75*  --   --  3.84*   < > = values in this interval not displayed.    Estimated Creatinine Clearance: 38.6 mL/min (A) (by C-G formula based on SCr of 3.84 mg/dL (H)).   Assessment: 43 yo female with extensive RLE DVT and concern for pneumonia.  Anticoag:  Heparin level 0.50 on heparin 2650 units/hr. INR 1.1 on day 3 after second dose of warfarin 7.5mg . Hgb decreased from 8.1 to 7.4. Plts stable. No signs of active bleeding.    ID: Concerns for PNA on initial chest x-ray. CT negative for infiltrate. WBC 13.5. Afebrile.  7/11 cefepime >> 7/14 7/11 vancomycin >> 7/12  Microbiology results: 7/12 Ucx: ngtd 7/11 BCx: ngtd 7/11 UCx: negative 7/11 Covid 19: negative  Goal of Therapy:  INR 2-3 Heparin level 0.3-0.7 units/ml Monitor platelets by anticoagulation protocol: Yes   Plan:  Continue heparin 2650 units/hr Monitor daily heparin level and CBC Warfarin 10 mg x1  Monitor for S&S of bleeding    Lorel Monaco, PharmD PGY1 Ambulatory Care Resident [employer] # [phone]   05/28/2019,9:18 AM

## 2019-05-28 NOTE — Significant Event (Signed)
Rapid Response Event Note  Overview: Time Called: 2152 Arrival Time: 2158 Event Type: Neurologic, Other (Comment)(hypoglycemic, AMS)  Called by bedside RN for pt with AMS, CBG 67, 1/2 amp d50 given.   Initial Focused Assessment: On arrival, pt opens eyes to voice, was able to say her name only. With minimal command following. She did have good strength BUE and could move BLE without difficulty. spO2 placed on pt showing 76% on RA. Placed on NRB with improvement to 95%. abg 7.3/44/382/21. Pt began to wake up more and became more verbal, repeat CBG 96. Placed on [place]. Provider called and ordered another amp D50 and changed fluids to D10. Pt slowly more alert and answering questions. TRH NP at bedside to assess.   Interventions:  O2, abg, D50, change fluids to D10, BMP, CBC  Plan of Care (if not transferred): Continue to monitor pt neurologic status. Continue to check CBGs regularly.   Event Summary:  called  at  2152    event ended at  [place]

## 2019-05-28 NOTE — Progress Notes (Signed)
Occupational Therapy Treatment Patient Details Name: Megan Ortiz MRN: [id number] DOB: 02-10-1976 Today's Date: 05/28/2019    History of present illness Patient is a pleasant 43 year old female with morbid obesity, asthma, hypertension, hiatal hernia, bleeding gastric ulcer over 10 years ago while living in [place], history of PE approximately 10 years ago per patient was on anticoagulation that has subsequently been discontinued, history of necrotizing fasciitis who presented to the ED with complaints of 2 days of right leg pain.  Work-up consistent with extensive right lower extremity DVT from right common femoral vein down to the calf veins.  Patient noted to have a leukocytosis of 18.3, hemoglobin of 7.2.  Has IVC filter.  Also underwent Esophagogastroduodenoscopy.     OT comments  Pt not progressing towards OT goals due to decrease in cognition. Pt unable to follow one step commands in conjunction with AE education (Pt provided with AE kit including toilet aide). Pt unable to answer questions appropriately, unable to state where she was, blank stare. RN and NT notified. In room with Patient at end of session. Pt will need continued education for use of AE as Pt likely did not retain any of it from this session.    Follow Up Recommendations  No OT follow up;Supervision - Intermittent    Equipment Recommendations  Other (comment)(provided with AE kit)    Recommendations for Other Services      Precautions / Restrictions Precautions Precautions: Fall Restrictions Weight Bearing Restrictions: No       Mobility Bed Mobility Overal bed mobility: Needs Assistance Bed Mobility: Supine to Sit     Supine to sit: Independent     General bed mobility comments: declined this session  Transfers                 General transfer comment: deferred    Balance Overall balance assessment: Needs assistance Sitting-balance support: No upper extremity supported;Feet  supported Sitting balance-Leahy Scale: Fair                                     ADL either performed or assessed with clinical judgement   ADL Overall ADL's : Needs assistance/impaired             Lower Body Bathing: Minimal assistance;With adaptive equipment;Bed level Lower Body Bathing Details (indicate cue type and reason): educated in long handle sponge Upper Body Dressing : Moderate assistance;Bed level   Lower Body Dressing: Maximal assistance;Bed level Lower Body Dressing Details (indicate cue type and reason): educated in [employer] fast streamer, sock aide (bari), long handle shoe horn,      Toileting- Clothing Manipulation and Hygiene: Maximal assistance Toileting - Clothing Manipulation Details (indicate cue type and reason): educated in toilet aide       General ADL Comments: educated in [place] (including toilet aide), Pt with varying levels of cognition throughout session. Unsure of retention     Vision       Perception     Praxis      Cognition Arousal/Alertness: Awake/alert Behavior During Therapy: Flat affect Overall Cognitive Status: Impaired/Different from baseline Area of Impairment: Orientation;Attention;Following commands;Safety/judgement;Awareness;Problem solving                 Orientation Level: Disoriented to;Place;Time;Situation Current Attention Level: Focused   Following Commands: Follows one step commands inconsistently Safety/Judgement: Decreased awareness of safety;Decreased awareness of deficits Awareness: Intellectual Problem Solving: Slow processing;Decreased initiation;Difficulty sequencing;Requires verbal cues;Requires tactile  cues General Comments: Pt repeating therapist, answering questions inaccurately, unable to follow directions        Exercises     Shoulder Instructions       General Comments      Pertinent Vitals/ Pain       Pain Assessment: Faces Faces Pain Scale: Hurts a little bit Pain  Location: RLE Pain Descriptors / Indicators: Sore Pain Intervention(s): Monitored during session;Limited activity within patient's tolerance  Home Living                                          Prior Functioning/Environment              Frequency  Min 2X/week        Progress Toward Goals  OT Goals(current goals can now be found in the care plan section)  Progress towards OT goals: Progressing toward goals(change in cognition)  Acute Rehab OT Goals Patient Stated Goal: unable to state at this time  Plan Discharge plan remains appropriate    Co-evaluation                 AM-PAC OT "6 Clicks" Daily Activity     Outcome Measure   Help from another person eating meals?: None Help from another person taking care of personal grooming?: A Little Help from another person toileting, which includes using toliet, bedpan, or urinal?: A Lot Help from another person bathing (including washing, rinsing, drying)?: A Lot Help from another person to put on and taking off regular upper body clothing?: A Little Help from another person to put on and taking off regular lower body clothing?: A Lot 6 Click Score: 16    End of Session    OT Visit Diagnosis: Unsteadiness on feet (R26.81);Muscle weakness (generalized) (M62.81);Pain Pain - Right/Left: Right Pain - part of body: Leg   Activity Tolerance Other (comment)(treatment limited by change in cognition)   Patient Left in bed;with call bell/phone within reach;with nursing/sitter in room   Nurse Communication Other (comment)(significant change in cognition)        Time: 2536-6440 OT Time Calculation (min): 21 min  Charges: OT General Charges $OT Visit: 1 Visit OT Treatments $Self Care/Home Management : 8-22 mins  Megan Ortiz OTR/L Acute Rehabilitation Services Pager: [phone] Office: [place] 05/28/2019, 5:39 PM

## 2019-05-29 ENCOUNTER — Inpatient Hospital Stay (HOSPITAL_COMMUNITY): Payer: Self-pay

## 2019-05-29 LAB — BLOOD GAS, ARTERIAL
Acid-base deficit: 4.9 mmol/L — ABNORMAL HIGH (ref 0.0–2.0)
Bicarbonate: 20.5 mmol/L (ref 20.0–28.0)
Drawn by: 441371
FIO2: 21
O2 Saturation: 89.6 %
Patient temperature: 96.8
pCO2 arterial: 41.6 mmHg (ref 32.0–48.0)
pH, Arterial: 7.307 — ABNORMAL LOW (ref 7.350–7.450)
pO2, Arterial: 60.9 mmHg — ABNORMAL LOW (ref 83.0–108.0)

## 2019-05-29 LAB — CBC WITH DIFFERENTIAL/PLATELET
Abs Immature Granulocytes: 0.65 10*3/uL — ABNORMAL HIGH (ref 0.00–0.07)
Basophils Absolute: 0.1 10*3/uL (ref 0.0–0.1)
Basophils Relative: 1 %
Eosinophils Absolute: 0.8 10*3/uL — ABNORMAL HIGH (ref 0.0–0.5)
Eosinophils Relative: 5 %
HCT: 27.2 % — ABNORMAL LOW (ref 36.0–46.0)
Hemoglobin: 7.4 g/dL — ABNORMAL LOW (ref 12.0–15.0)
Immature Granulocytes: 4 %
Lymphocytes Relative: 13 %
Lymphs Abs: 2 10*3/uL (ref 0.7–4.0)
MCH: 21.3 pg — ABNORMAL LOW (ref 26.0–34.0)
MCHC: 27.2 g/dL — ABNORMAL LOW (ref 30.0–36.0)
MCV: 78.2 fL — ABNORMAL LOW (ref 80.0–100.0)
Monocytes Absolute: 1 10*3/uL (ref 0.1–1.0)
Monocytes Relative: 7 %
Neutro Abs: 10.7 10*3/uL — ABNORMAL HIGH (ref 1.7–7.7)
Neutrophils Relative %: 70 %
Platelets: 388 10*3/uL (ref 150–400)
RBC: 3.48 MIL/uL — ABNORMAL LOW (ref 3.87–5.11)
RDW: 23 % — ABNORMAL HIGH (ref 11.5–15.5)
WBC: 15.3 10*3/uL — ABNORMAL HIGH (ref 4.0–10.5)
nRBC: 0.5 % — ABNORMAL HIGH (ref 0.0–0.2)

## 2019-05-29 LAB — PROTIME-INR
INR: 1.6 — ABNORMAL HIGH (ref 0.8–1.2)
Prothrombin Time: 19.2 seconds — ABNORMAL HIGH (ref 11.4–15.2)

## 2019-05-29 LAB — COMPREHENSIVE METABOLIC PANEL
ALT: 13 U/L (ref 0–44)
AST: 11 U/L — ABNORMAL LOW (ref 15–41)
Albumin: 2.6 g/dL — ABNORMAL LOW (ref 3.5–5.0)
Alkaline Phosphatase: 65 U/L (ref 38–126)
Anion gap: 14 (ref 5–15)
BUN: 45 mg/dL — ABNORMAL HIGH (ref 6–20)
CO2: 21 mmol/L — ABNORMAL LOW (ref 22–32)
Calcium: 11 mg/dL — ABNORMAL HIGH (ref 8.9–10.3)
Chloride: 97 mmol/L — ABNORMAL LOW (ref 98–111)
Creatinine, Ser: 4.78 mg/dL — ABNORMAL HIGH (ref 0.44–1.00)
GFR calc Af Amer: 12 mL/min — ABNORMAL LOW (ref >60)
GFR calc non Af Amer: 10 mL/min — ABNORMAL LOW (ref >60)
Glucose, Bld: 106 mg/dL — ABNORMAL HIGH (ref 70–99)
Potassium: 4.2 mmol/L (ref 3.5–5.1)
Sodium: 132 mmol/L — ABNORMAL LOW (ref 135–145)
Total Bilirubin: 0.4 mg/dL (ref 0.3–1.2)
Total Protein: 7.2 g/dL (ref 6.5–8.1)

## 2019-05-29 LAB — IRON AND TIBC
Iron: 91 ug/dL (ref 28–170)
Saturation Ratios: 30 % (ref 10.4–31.8)
TIBC: 307 ug/dL (ref 250–450)
UIBC: 216 ug/dL

## 2019-05-29 LAB — GLUCOSE, CAPILLARY
Glucose-Capillary: 60 mg/dL — ABNORMAL LOW (ref 70–99)
Glucose-Capillary: 73 mg/dL (ref 70–99)
Glucose-Capillary: 79 mg/dL (ref 70–99)
Glucose-Capillary: 85 mg/dL (ref 70–99)
Glucose-Capillary: 96 mg/dL (ref 70–99)
Glucose-Capillary: 99 mg/dL (ref 70–99)

## 2019-05-29 LAB — CULTURE, BLOOD (ROUTINE X 2)
Culture: NO GROWTH
Culture: NO GROWTH
Specimen Description: ADEQUATE
Specimen Description: ADEQUATE

## 2019-05-29 LAB — RETICULOCYTES
Immature Retic Fract: 55.3 % — ABNORMAL HIGH (ref 2.3–15.9)
RBC.: 3.19 MIL/uL — ABNORMAL LOW (ref 3.87–5.11)
Retic Count, Absolute: 78.8 10*3/uL (ref 19.0–186.0)
Retic Ct Pct: 2.5 % (ref 0.4–3.1)

## 2019-05-29 LAB — LACTIC ACID, PLASMA
Lactic Acid, Venous: 0.9 mmol/L (ref 0.5–1.9)
Lactic Acid, Venous: 1 mmol/L (ref 0.5–1.9)

## 2019-05-29 LAB — PROCALCITONIN: Procalcitonin: 0.46 ng/mL

## 2019-05-29 LAB — CREATININE, URINE, RANDOM: Creatinine, Urine: 349.63 mg/dL

## 2019-05-29 LAB — SODIUM, URINE, RANDOM: Sodium, Ur: 14 mmol/L

## 2019-05-29 LAB — LIPASE, BLOOD: Lipase: 34 U/L (ref 11–51)

## 2019-05-29 LAB — VITAMIN B12: Vitamin B-12: 910 pg/mL (ref 180–914)

## 2019-05-29 LAB — FERRITIN: Ferritin: 127 ng/mL (ref 11–307)

## 2019-05-29 LAB — HEPARIN LEVEL (UNFRACTIONATED): Heparin Unfractionated: 0.59 IU/mL (ref 0.30–0.70)

## 2019-05-29 LAB — AMMONIA: Ammonia: 19 umol/L (ref 9–35)

## 2019-05-29 LAB — MRSA PCR SCREENING: MRSA by PCR: NEGATIVE

## 2019-05-29 MED ORDER — VANCOMYCIN HCL 10 G IV SOLR: 2000.0000 mg | INTRAVENOUS | Status: DC

## 2019-05-29 MED ORDER — WARFARIN SODIUM 7.5 MG PO TABS
7.5000 mg | ORAL_TABLET | Freq: Once | ORAL | Status: AC
  Filled 2019-05-29: qty 1

## 2019-05-29 MED ORDER — SODIUM CHLORIDE 0.9 % IV BOLUS
250.0000 mL | Freq: Once | INTRAVENOUS | Status: AC
  Administered 2019-05-29: 08:00:00 250 mL via INTRAVENOUS

## 2019-05-29 MED ORDER — SODIUM CHLORIDE 0.9 % IV SOLN
INTRAVENOUS | Status: DC
  Administered 2019-05-29 (×2): via INTRAVENOUS

## 2019-05-29 MED ORDER — FERROUS SULFATE 325 (65 FE) MG PO TABS
325.0000 mg | ORAL_TABLET | Freq: Two times a day (BID) | ORAL | Status: DC
  Administered 2019-05-30 – 2019-06-03 (×8): 325 mg via ORAL
  Filled 2019-05-29 (×6): qty 1

## 2019-05-29 MED ORDER — SODIUM CHLORIDE 0.9 % IV BOLUS
1000.0000 mL | Freq: Once | INTRAVENOUS | Status: AC
  Administered 2019-05-29: 1000 mL via INTRAVENOUS

## 2019-05-29 MED ORDER — PHENYLEPHRINE HCL-NACL 10-0.9 MG/250ML-% IV SOLN
0.0000 ug/min | INTRAVENOUS | Status: DC
  Filled 2019-05-29: qty 250

## 2019-05-29 MED ORDER — ALBUMIN HUMAN 25 % IV SOLN
50.0000 g | Freq: Four times a day (QID) | INTRAVENOUS | Status: AC
  Administered 2019-05-29 – 2019-05-30 (×4): 50 g via INTRAVENOUS
  Filled 2019-05-29 (×4): qty 200

## 2019-05-29 MED ORDER — PIPERACILLIN-TAZOBACTAM 3.375 G IVPB
3.3750 g | Freq: Three times a day (TID) | INTRAVENOUS | Status: DC
  Administered 2019-05-30 – 2019-06-01 (×8): 3.375 g via INTRAVENOUS
  Filled 2019-05-29 (×6): qty 50

## 2019-05-29 MED ORDER — VANCOMYCIN HCL 10 G IV SOLR
2500.0000 mg | Freq: Once | INTRAVENOUS | Status: DC
  Filled 2019-05-29: qty 2500

## 2019-05-29 MED ORDER — SODIUM CHLORIDE 0.9 % IV BOLUS
500.0000 mL | Freq: Once | INTRAVENOUS | Status: AC
  Administered 2019-05-29: 500 mL via INTRAVENOUS

## 2019-05-29 NOTE — Progress Notes (Signed)
Patient's oxygen saturation is 93-99% RA. Consulted RT after blood gas results. Patient is encouraged to keep 2L [place] on. Pt is confused and continues to take  off. Spoke to husband on the phone informing him that pt is still neurologically same. Husband requests for MD to call him. Will continue to monitor.  Fransico Michael, RN

## 2019-05-29 NOTE — Progress Notes (Signed)
Called and given updates to the patient's husband via phone. All questions answered to his satisfaction.

## 2019-05-29 NOTE — Progress Notes (Signed)
Patient is still confused only oriented to self,blood sugar is 99,UO is 30 cc since 1900. K. Baltazar Najjar is updated about the patient.Will continue to monitor.

## 2019-05-29 NOTE — Progress Notes (Signed)
Paged CT. Bariatric table unavailable for stat CT at this time. Will call back when it is available

## 2019-05-29 NOTE — Progress Notes (Addendum)
PROGRESS NOTE  Megan Ortiz JTT:[id number] DOB: [birth year] DOA: 05/24/2019 PCP: Larene Beach, MD  HPI/Recap of past 24 hours: Patient is a pleasant 43 year old female with morbid obesity, asthma, hypertension, hiatal hernia, bleeding gastric ulcer over 10 years ago while living in [place], history of PE approximately 10 years ago per patient was on anticoagulation that has subsequently been discontinued, history of necrotizing fasciitis who presented to the ED with complaints of 2 days of right leg pain.  Work-up consistent with extensive right lower extremity DVT from right common femoral vein down to the calf veins.  Patient noted to have a leukocytosis of 18.3, hemoglobin of 7.2.  Patient was sent for CT angiogram at [facility] however due to her size unable to fit into the [place] subsequently sent to [facility].  CT angiogram chest which was done was negative for PE however did show a large hiatal hernia with dependent density within the hiatal hernia suggestive of active hemorrhage.  Patient noted to have a prior history of IVC filter placement.  Patient initially placed on heparin however due to concerns for GI bleed heparin discontinued.  Patient noted to have a hemoglobin drop as low as 6.5 and being transfused 2 units of packed red blood cells.  Patient also placed empirically on IV antibiotics due to concern for pneumonia noted on chest x-ray on presentation to the ED.  Patient placed on octreotide drip, started on Protonix drip and GI consulted for further evaluation and management.    05/29/19: Patient was seen and examined at her bedside this morning.  She is obtunded and hypotensive.  Given IV fluid boluses and started on IV albumin 50 g x 4 doses.  Worsening renal function, discussed with nephrology, change in mentation is less likely secondary to uremia with a BUN of 43.  Subtherapeutic INR 1.6 on Coumadin.    Assessment/Plan: Principal Problem:   Leg DVT (deep venous  thromboembolism), acute, right (HCC) Active Problems:   AKI (acute kidney injury) ([place])   Benign essential HTN   Morbid Obesity    Adjustment disorder with mixed anxiety and depressed mood   Leukocytosis   Right leg pain   Suspected Pulmonary embolus   Community acquired pneumonia   Positive D dimer   Left lower lobe pneumonia (HCC)   Hypotension   ARF (acute renal failure) (HCC)   DVT (deep venous thrombosis) (HCC)   Anemia   Morbid obesity (HCC)   Abnormal CT scan, stomach   Persistent Acute metabolic encephalopathy of unclear etiology Possibly secondary to hypotension Pt is obtunded No focal deficits on exam BUN is 43, less likely uremia Ammonia negative UA negative Given IV fluid boluses but BP still low Started concentrated IV albumin 50 g x 4 doses Continue normal saline at 100 cc/h x 1 day per nephrology No significant improvement with iv fluid and albumin Will start pressors to Maintain map greater than 65 Transfer to progressive, medical CT head, chest and abd/pelvis pending EEG ordered  Severe hypotension of unclear etiology Management as stated above  Acute hypoxic respiratory failure O2 saturation 93% on 3 L ABG reviewed and mainly shows hypoxia with no hypercarbia. Maintain O2 sat >92%  Hx gastric ulcer GI consulted patient seen in consultation by Dr. Ardis Hughs and patient subsequently underwent upper endoscopy  on 05/25/2019 which showed a large hiatal hernia filled with solid and liquid food, no old or recent blood noted, gastric contents freely reflux to mid esophagus.octreotide drip was discontinued yesterday evening.  Protonix drip discontinued and patient now on oral PPI twice daily.  Status post 2 units packed red blood cells hemoglobin trended back down may be dilutional effect however currently at 7.0 from 8.1 from 6.5 on admission  Leukocytosis, unclear etiology Pt is obtunded Will obtain CT scans WBC 15 K Lactic acid and procalcitonin negative  Will obtain blood cultures x 2 and start IV abx empirically  Acute left lower extremity DVT Currently on Coumadin started on 05/26/2019 and being bridged with heparin drip  Subtherapeutic INR 1.6  Pharmacy following Optimize pain control  Subtherapeutic INR Goal INR between 2 and 3 Pharmacy managing  Persistent hypotension Management as stated above Maintain map greater than 65  Worsening AKI with oliguria, likely multifactorial Presented with creatinine of 1.35 and GFR 48 Creatinine continues to trend up 3.84>> .25>>4.78 Renal ultrasound unrevealing No proteinuria on UA 90 cc urine output recorded in the last 24 hours Nephrology following.  Highly appreciated.  Microcytic anemia Stable 7.4 from 7.1 yesterday MCV 78 Obtain iron studies  Transfuse 1 unit PRBC  Generalized anxiety/depression Continue Zoloft  GERD Continue Protonix p.o. 40 mg twice daily  Severe morbid obesity Patient has a BMI of 88 She would greatly benefit from weight loss  Questionable Left lower lobe pneumonia Initially noted on chest x-ray on admission.  CT angiogram chest which was done was negative for any acute infiltrate and PE.  Leukocytosis elevated.  Patient with no respiratory symptoms.  COVID-19 negative. IV zosyn and IV vanc empirically. Reassess in the am.  DVT prophylaxis: Heparin drip and Coumadin Code Status: Full Family Communication:  Updated the patient's husband via phone on 05/29/19. Disposition Plan:  undetermined at this time.   Consultants:   Gastroenterology: Dr. Ardis Hughs 05/25/2019  Nephrology  Procedures:   CT angiogram chest 05/24/2019  2 units packed red blood cell transfusion 05/24/2019>> 05/25/2019  Right lower extremity Doppler 05/24/2019  Chest x-ray 05/24/2019, 05/25/2019  Renal ultrasound 05/24/2019  Upper endoscopy 05/25/2019 per Dr. Ardis Hughs  Antimicrobials:   IV cefepime 05/24/2019>>>>> 05/27/2019  IV vancomycin 05/24/2019>>>>> 05/25/2019  IV  vancomycin 05/29/19>>  IV zosyn 05/29/19>>   Objective: Vitals:   05/28/19 2221 05/28/19 2315 05/29/19 0355 05/29/19 0802  BP: (!) 117/94 (!) 99/38 (!) 102/42 (!) 110/52  Pulse: 87   82  Resp: 11 14 16 18   Temp: 98.3 F (36.8 C)  97.9 F (36.6 C) (!) 96.8 F (36 C)  TempSrc: Axillary  Oral Axillary  SpO2: 94% 98% 98% 94%  Weight:      Height:        Intake/Output Summary (Last 24 hours) at 05/29/2019 0853 Last data filed at 05/29/2019 0649 Gross per 24 hour  Intake 1953.93 ml  Output 90 ml  Net 1863.93 ml   Filed Weights   05/24/19 0853 05/26/19 0400 05/27/19 0620  Weight: (!) 233.5 kg (!) 234 kg (!) 241.7 kg    Exam:  . General: 43 y.o. year-old female severe morbid obesity obtunded . Cardiovascular: Regular rate and rhythm with no rubs or gallops.  No JVD or thyromegaly noted.   Marland Kitchen Respiratory: No rales or wheezes.  Poor inspiratory effort. . Abdomen: Severe morbid obesity.  Nontender.  Hypoactive bowel sounds.. Psychiatry: Unable to assess mood due to obtundation.  Data Reviewed: CBC: Recent Labs  Lab 05/25/19 0718  05/26/19 0046 05/27/19 0412 05/27/19 1458 05/28/19 0343 05/28/19 2300 05/29/19 0551  WBC 11.9*  --  14.7* 13.2*  --  13.5* 15.0* 15.3*  NEUTROABS 9.0*  --  11.4* 10.7*  --  10.8*  --  10.7*  HGB 7.1*   < > 8.1* 7.0* 8.1* 7.4* 7.1* 7.4*  HCT 25.9*   < > 29.2* 25.4* 29.4* 26.8* 26.2* 27.2*  MCV 73.8*  --  76.0* 76.5*  --  76.6* 78.0* 78.2*  PLT 295  --  325 344  --  369 341 388   < > = values in this interval not displayed.   Basic Metabolic Panel: Recent Labs  Lab 05/25/19 0718 05/26/19 0046 05/27/19 0412 05/28/19 0343 05/28/19 2300 05/29/19 0551  NA 133* 133* 134* 133* 131* 132*  K 4.4 4.5 4.3 4.3 4.2 4.2  CL 102 104 99 99 98 97*  CO2 19* 16* 24 22 20* 21*  GLUCOSE 155* 150* 131* 110* 150* 106*  BUN 26* 28* 35* 41* 43* 45*  CREATININE 2.07* 1.93* 2.75* 3.84* 4.25* 4.78*  CALCIUM 10.1 9.8 9.9 11.0* 10.9* 11.0*  MG 1.7 2.3 2.3  --    --   --    GFR: Estimated Creatinine Clearance: 31 mL/min (A) (by C-G formula based on SCr of 4.78 mg/dL (H)). Liver Function Tests: Recent Labs  Lab 05/25/19 0718 05/26/19 0046 05/27/19 0412 05/28/19 0343 05/29/19 0551  AST 14* 12* 13* 14* 11*  ALT 11 13 13 13 13   ALKPHOS 67 68 58 66 65  BILITOT 1.0 0.8 0.7 0.9 0.4  PROT 7.4 7.7 6.7 7.6 7.2  ALBUMIN 3.2* 3.2* 2.7* 2.8* 2.6*   No results for input(s): LIPASE, AMYLASE in the last 168 hours. Recent Labs  Lab 05/29/19 0551  AMMONIA 19   Coagulation Profile: Recent Labs  Lab 05/24/19 0629 05/26/19 1033 05/27/19 0852 05/28/19 0343 05/29/19 0551  INR 1.1 1.2 1.2 1.1 1.6*   Cardiac Enzymes: No results for input(s): CKTOTAL, CKMB, CKMBINDEX, TROPONINI in the last 168 hours. BNP (last 3 results) No results for input(s): PROBNP in the last 8760 hours. HbA1C: No results for input(s): HGBA1C in the last 72 hours. CBG: Recent Labs  Lab 05/28/19 2243 05/28/19 2311 05/29/19 0020 05/29/19 0358 05/29/19 0806  GLUCAP 91 114* 96 99 85   Lipid Profile: No results for input(s): CHOL, HDL, LDLCALC, TRIG, CHOLHDL, LDLDIRECT in the last 72 hours. Thyroid Function Tests: No results for input(s): TSH, T4TOTAL, FREET4, T3FREE, THYROIDAB in the last 72 hours. Anemia Panel: No results for input(s): VITAMINB12, FOLATE, FERRITIN, TIBC, IRON, RETICCTPCT in the last 72 hours. Urine analysis:    Component Value Date/Time   COLORURINE AMBER (A) 05/27/2019 1300   APPEARANCEUR CLOUDY (A) 05/27/2019 1300   LABSPEC 1.032 (H) 05/27/2019 1300   PHURINE 5.0 05/27/2019 1300   GLUCOSEU NEGATIVE 05/27/2019 1300   HGBUR NEGATIVE 05/27/2019 1300   BILIRUBINUR NEGATIVE 05/27/2019 1300   KETONESUR 5 (A) 05/27/2019 1300   PROTEINUR NEGATIVE 05/27/2019 1300   UROBILINOGEN 0.2 05/11/2013 1903   NITRITE NEGATIVE 05/27/2019 1300   LEUKOCYTESUR NEGATIVE 05/27/2019 1300   Sepsis Labs: @LABRCNTIP (procalcitonin:4,lacticidven:4)  ) Recent Results  (from the past 240 hour(s))  Blood Culture (routine x 2)     Status: None   Collection Time: 05/24/19  8:35 AM   Specimen: Left Antecubital; Blood  Result Value Ref Range Status   Specimen Description LEFT ANTECUBITAL Blood Culture adequate volume  Final   Special Requests BOTTLES DRAWN AEROBIC AND ANAEROBIC  Final   Culture   Final    NO GROWTH 5 DAYS Performed at [facility], [address], [place], [place] [address]    Report Status  05/29/2019 FINAL  Final  SARS Coronavirus 2 (CEPHEID - Performed in [place] hospital lab), Hosp Order     Status: None   Collection Time: 05/24/19  8:35 AM   Specimen: Nasopharyngeal Swab  Result Value Ref Range Status   SARS Coronavirus 2 NEGATIVE NEGATIVE Final    Comment: (NOTE) If result is NEGATIVE SARS-CoV-2 target nucleic acids are NOT DETECTED. The SARS-CoV-2 RNA is generally detectable in upper and lower  respiratory specimens during the acute phase of infection. The lowest  concentration of SARS-CoV-2 viral copies this assay can detect is 250  copies / mL. A negative result does not preclude SARS-CoV-2 infection  and should not be used as the sole basis for treatment or other  patient management decisions.  A negative result may occur with  improper specimen collection / handling, submission of specimen other  than nasopharyngeal swab, presence of viral mutation(s) within the  areas targeted by this assay, and inadequate number of viral copies  (<250 copies / mL). A negative result must be combined with clinical  observations, patient history, and epidemiological information. If result is POSITIVE SARS-CoV-2 target nucleic acids are DETECTED. The SARS-CoV-2 RNA is generally detectable in upper and lower  respiratory specimens dur ing the acute phase of infection.  Positive  results are indicative of active infection with SARS-CoV-2.  Clinical  correlation with patient history and other diagnostic information is  necessary to  determine patient infection status.  Positive results do  not rule out bacterial infection or co-infection with other viruses. If result is PRESUMPTIVE POSTIVE SARS-CoV-2 nucleic acids MAY BE PRESENT.   A presumptive positive result was obtained on the submitted specimen  and confirmed on repeat testing.  While 2019 novel coronavirus  (SARS-CoV-2) nucleic acids may be present in the submitted sample  additional confirmatory testing may be necessary for epidemiological  and / or clinical management purposes  to differentiate between  SARS-CoV-2 and other Sarbecovirus currently known to infect humans.  If clinically indicated additional testing with an alternate test  methodology [phone]) is advised. The SARS-CoV-2 RNA is generally  detectable in upper and lower respiratory sp ecimens during the acute  phase of infection. The expected result is Negative. Fact Sheet for Patients:  StrictlyIdeas.no Fact Sheet for Healthcare Providers: BankingDealers.co.za This test is not yet approved or cleared by the [place] FDA and has been authorized for detection and/or diagnosis of SARS-CoV-2 by FDA under an Emergency Use Authorization (EUA).  This EUA will remain in effect (meaning this test can be used) for the duration of the COVID-19 declaration under Section 564(b)(1) of the Act, 21 U.S.C. section 360bbb-3(b)(1), unless the authorization is terminated or revoked sooner. Performed at [facility], [address], [place], [place] [address]   Blood Culture (routine x 2)     Status: None   Collection Time: 05/24/19  8:36 AM   Specimen: BLOOD LEFT HAND  Result Value Ref Range Status   Specimen Description BLOOD LEFT HAND Blood Culture adequate volume  Final   Special Requests BOTTLES DRAWN AEROBIC AND ANAEROBIC  Final   Culture   Final    NO GROWTH 5 DAYS Performed at [facility], [address] [place], [place] [address]    Report Status  05/29/2019 FINAL  Final  Urine Culture     Status: None   Collection Time: 05/25/19  3:50 AM   Specimen: Urine, Catheterized  Result Value Ref Range Status   Specimen Description URINE, CATHETERIZED  Final  Special Requests NONE  Final   Culture   Final    NO GROWTH Performed at [facility], [place] [address], [place], [place] [address]    Report Status 05/26/2019 FINAL  Final  Urine Culture     Status: None   Collection Time: 05/27/19  8:12 AM   Specimen: Urine, Random  Result Value Ref Range Status   Specimen Description URINE, RANDOM  Final   Special Requests NONE  Final   Culture   Final    NO GROWTH Performed at [facility], [place] [address], [place], [place] [address]    Report Status 05/28/2019 FINAL  Final      Studies: No results found.  Scheduled Meds: . docusate sodium  100 mg Oral BID  . montelukast  10 mg Oral Daily  . nortriptyline  25 mg Oral QHS  . pantoprazole  40 mg Oral BID AC  . sertraline  100 mg Oral BID  . sodium chloride flush  10-40 mL Intracatheter Q12H  . sodium chloride flush  3 mL Intravenous Q12H  . topiramate  50 mg Oral Daily  . warfarin  7.5 mg Oral ONCE-1800  . Warfarin - Pharmacist Dosing Inpatient   Does not apply q1800    Continuous Infusions: . sodium chloride    . sodium chloride    . albumin human    . dextrose 50 mL/hr at 05/29/19 0557  . heparin 2,650 Units/hr (05/29/19 0653)  . sodium chloride       LOS: 5 days     Kayleen Memos, MD [facility] Pager [phone]  If 7PM-7AM, please contact night-coverage [email or web address] Password [email or web address] 05/29/2019, 8:53 AM

## 2019-05-29 NOTE — Progress Notes (Addendum)
Patient husband was updated for the event happened,patient husband is concerned about the orientation of the patient after talking  to her.(different from her baseline) and wants to talk to physician on call. Patient is only oriented to self,able to identify name and date of birth other than that she is disoriented. Tylene Fantasia made aware about the patient and husband concern.This RN call back the patient husband and explained and answers his concerned and tell him that I already talk to Tylene Fantasia NP and assure that we will monitor his wife closely and update him if some significant changes happened.We will checked patient blood sugar.Will continue  to monitor.

## 2019-05-29 NOTE — Progress Notes (Signed)
Unable to see any urine on bladder scan. Pt's mental status has not improved. Megan Ortiz BP 102/48 HR 89. Pt is continuing to receive fluids as ordered. No urine output in foley bag. Pt is using accessory muscles while breathing. Pt is feeling discomfort and unable to sit still. Paged Dr. [place] Crane & [facility] Response. Will continue to monitor.   Megan Michael, RN

## 2019-05-29 NOTE — Progress Notes (Addendum)
ANTICOAGULATION AND ANTIBIOTIC CONSULT NOTE - Follow Up Consult  Pharmacy Consult for heparin and warfarin Indication: DVT, PNA  Allergies  Allergen Reactions  . Aspirin     Due to hx of stomach ulcers    Patient Measurements: Height: 5\' 5"  (165.1 cm) Weight: (patient cant stand,bed is not zeroed.) IBW/kg (Calculated) : 57 Heparin Dosing Weight: 120 kg  Vital Signs: Temp: 97.9 F (36.6 C) (07/16 0355) Temp Source: Oral (07/16 0355) BP: 102/42 (07/16 0355) Pulse Rate: 87 (07/15 2221)  Labs: Recent Labs    05/27/19 0852  05/28/19 0343 05/28/19 2300 05/29/19 0551  HGB  --    < > 7.4* 7.1* 7.4*  HCT  --    < > 26.8* 26.2* 27.2*  PLT  --   --  369 341 388  LABPROT 14.6  --  14.5  --  19.2*  INR 1.2  --  1.1  --  1.6*  HEPARINUNFRC 0.58  --  0.50  --  0.59  CREATININE  --   --  3.84* 4.25* 4.78*   < > = values in this interval not displayed.    Estimated Creatinine Clearance: 31 mL/min (A) (by C-G formula based on SCr of 4.78 mg/dL (H)).   Assessment: 43 yo female with extensive RLE DVT  Anticoag:  Heparin level 0.59 on heparin 2650 units/hr. INR 1.6 on day 4 after third dose of warfarin 10 mg. Hgb decreased from 8.1 to 7.4. Plts stable. No signs of active bleeding.    Goal of Therapy:  INR 2-3 Heparin level 0.3-0.7 units/ml Monitor platelets by anticoagulation protocol: Yes   Plan:  Continue heparin 2650 units/hr Monitor daily heparin level and CBC Warfarin 7.5 mg x1  Monitor for S&S of bleeding    Lorel Monaco, PharmD PGY1 Ambulatory Care Resident [employer] # [phone]   05/29/2019,7:57 AM

## 2019-05-29 NOTE — Progress Notes (Addendum)
OT Cancellation Note  Patient Details Name: Rissa Turley MRN: [id number] DOB: 23-Dec-1975   Cancelled Treatment:    Reason Eval/Treat Not Completed: Fatigue/lethargy limiting ability to participate; checked in with RN and pt continuing to have decrease in mental status. Pt often staring off/falling asleep, responds to name but unable to maintain focus, will squeeze therapist's hands but otherwise unresponsive to simple commands. Will follow up for OT treatment as schedule permits and as pt is able to participate.   Lou Cal, OT [facility] Pager [phone] Office [phone]   Raymondo Band 05/29/2019, 2:24 PM

## 2019-05-29 NOTE — Progress Notes (Signed)
Pharmacy Antibiotic Note  Megan Ortiz is a 43 y.o. female admitted on 05/24/2019 with sepsis.  Pharmacy has been consulted for Zosyn dosing.  WBC (15.3) elevated. Afebrile. Worsening renal function (Scr 1.93 >> 4.78)  Plan: Zosyn 3.375g IV q8h (4 hour infusion).  Monitor renal function, clinical signs of infection  Height: 5\' 5"  (165.1 cm) Weight: (patient cant stand,bed is not zeroed.) IBW/kg (Calculated) : 57  Temp (24hrs), Avg:97.4 F (36.3 C), Min:96.2 F (35.7 C), Max:98.3 F (36.8 C)  Recent Labs  Lab 05/24/19 0644 05/24/19 0823  05/26/19 0046 05/27/19 0412 05/28/19 0343 05/28/19 2300 05/29/19 0551 05/29/19 0825  WBC  --   --    < > 14.7* 13.2* 13.5* 15.0* 15.3*  --   CREATININE  --   --    < > 1.93* 2.75* 3.84* 4.25* 4.78*  --   LATICACIDVEN 2.9* 1.8  --   --   --   --   --  0.9 1.0   < > = values in this interval not displayed.    Estimated Creatinine Clearance: 31 mL/min (A) (by C-G formula based on SCr of 4.78 mg/dL (H)).    Allergies  Allergen Reactions  . Aspirin     Due to hx of stomach ulcers    Antimicrobials this admission: Zosyn 7/16 >> Vancomycin 7/11 >> 7/12 Cefepime 7/11 >> 7/14 Flagyl 7/11  Microbiology results: 7/11 BCx: ng5d 7/14 UCx: ng  7/12 UCx: ng 7/11 COVID: neg  Thank you for allowing pharmacy to be a part of this patient's care.  Richardine Service, PharmD PGY1 Pharmacy Resident Direct Line: [phone]  05/29/2019 5:57 PM

## 2019-05-29 NOTE — Progress Notes (Signed)
Pt very lethargic, not eating or drinking, alert to some stimuli. Per day shift they have been checking her sugars to be cautions. Pt on d10 @50 . I checked pt sugar just to check. CBG 60 > paged on call tri[facility] X [place]. orderd to increase Dextrose to 143ml/hr. WIll continue to monitor.   Pt not transferring to ICU as of now. Pt BP 90s/50s.

## 2019-05-29 NOTE — Significant Event (Signed)
Rapid Response Event Note  Overview: Time Called: 2904 Arrival Time: [id number] Event Type: Neurologic  Initial Focused Assessment: Paged to room by RN pt with ongoing AMS  Interventions: Pt able to follow simple commands, oriented to name only, able to arouse with verbal stimuli but quickly goes back to sleep, bilateral grips equal, pt keeps removing her [place]. RN wanted me to consult CCM, I explained this was a dr to dr consult. Dr. [place] Crane had previously been in to see pt. I suggested the RN to re page MD  Plan of Care (if not transferred): Continue to monitor Event Summary: Name of Physician Notified: DR. Nevada Crane at 1330    at    Outcome: Stayed in room and stabalized     [place], Elida

## 2019-05-30 ENCOUNTER — Inpatient Hospital Stay (HOSPITAL_COMMUNITY): Payer: Self-pay

## 2019-05-30 DIAGNOSIS — G92 Toxic encephalopathy: Secondary | ICD-10-CM

## 2019-05-30 LAB — PROTIME-INR
INR: 2 — ABNORMAL HIGH (ref 0.8–1.2)
Prothrombin Time: 22.7 seconds — ABNORMAL HIGH (ref 11.4–15.2)

## 2019-05-30 LAB — RENAL FUNCTION PANEL
Albumin: 3.8 g/dL (ref 3.5–5.0)
Anion gap: 13 (ref 5–15)
BUN: 46 mg/dL — ABNORMAL HIGH (ref 6–20)
CO2: 19 mmol/L — ABNORMAL LOW (ref 22–32)
Calcium: 10.3 mg/dL (ref 8.9–10.3)
Chloride: 99 mmol/L (ref 98–111)
Creatinine, Ser: 4.67 mg/dL — ABNORMAL HIGH (ref 0.44–1.00)
GFR calc Af Amer: 12 mL/min — ABNORMAL LOW (ref >60)
GFR calc non Af Amer: 11 mL/min — ABNORMAL LOW (ref >60)
Glucose, Bld: 93 mg/dL (ref 70–99)
Phosphorus: 6.7 mg/dL — ABNORMAL HIGH (ref 2.5–4.6)
Potassium: 4.4 mmol/L (ref 3.5–5.1)
Sodium: 131 mmol/L — ABNORMAL LOW (ref 135–145)

## 2019-05-30 LAB — BLOOD GAS, ARTERIAL
Acid-base deficit: 5.4 mmol/L — ABNORMAL HIGH (ref 0.0–2.0)
Bicarbonate: 18.9 mmol/L — ABNORMAL LOW (ref 20.0–28.0)
Drawn by: 24686
FIO2: 21
O2 Saturation: 96.2 %
Patient temperature: 98.6
pCO2 arterial: 33.4 mmHg (ref 32.0–48.0)
pH, Arterial: 7.371 (ref 7.350–7.450)
pO2, Arterial: 79.4 mmHg — ABNORMAL LOW (ref 83.0–108.0)

## 2019-05-30 LAB — CBC
HCT: 22.9 % — ABNORMAL LOW (ref 36.0–46.0)
Hemoglobin: 6.7 g/dL — CL (ref 12.0–15.0)
MCH: 22.4 pg — ABNORMAL LOW (ref 26.0–34.0)
MCHC: 29.3 g/dL — ABNORMAL LOW (ref 30.0–36.0)
MCV: 76.6 fL — ABNORMAL LOW (ref 80.0–100.0)
Platelets: 340 10*3/uL (ref 150–400)
RBC: 2.99 MIL/uL — ABNORMAL LOW (ref 3.87–5.11)
RDW: 23.3 % — ABNORMAL HIGH (ref 11.5–15.5)
WBC: 12.8 10*3/uL — ABNORMAL HIGH (ref 4.0–10.5)
nRBC: 0.2 % (ref 0.0–0.2)

## 2019-05-30 LAB — VITAMIN B12: Vitamin B-12: 1249 pg/mL — ABNORMAL HIGH (ref 180–914)

## 2019-05-30 LAB — GLUCOSE, CAPILLARY
Glucose-Capillary: 72 mg/dL (ref 70–99)
Glucose-Capillary: 77 mg/dL (ref 70–99)
Glucose-Capillary: 82 mg/dL (ref 70–99)
Glucose-Capillary: 87 mg/dL (ref 70–99)

## 2019-05-30 LAB — HEPARIN LEVEL (UNFRACTIONATED): Heparin Unfractionated: 0.31 IU/mL (ref 0.30–0.70)

## 2019-05-30 LAB — HEMOGLOBIN AND HEMATOCRIT, BLOOD
HCT: 29 % — ABNORMAL LOW (ref 36.0–46.0)
Hemoglobin: 8.4 g/dL — ABNORMAL LOW (ref 12.0–15.0)

## 2019-05-30 LAB — FOLATE RBC
Folate, Hemolysate: 278 ng/mL
Folate, RBC: 1219 ng/mL (ref >498)
Hematocrit: 22.8 % — ABNORMAL LOW (ref 34.0–46.6)

## 2019-05-30 LAB — TSH: TSH: 0.194 u[IU]/mL — ABNORMAL LOW (ref 0.350–4.500)

## 2019-05-30 LAB — PREPARE RBC (CROSSMATCH)

## 2019-05-30 MED ORDER — LORAZEPAM 2 MG/ML IJ SOLN
1.0000 mg | Freq: Once | INTRAMUSCULAR | Status: AC | PRN
  Administered 2019-05-30: 1 mg via INTRAVENOUS
  Filled 2019-05-30: qty 1

## 2019-05-30 MED ORDER — SODIUM CHLORIDE 0.9% IV SOLUTION: Freq: Once | INTRAVENOUS | Status: DC

## 2019-05-30 NOTE — Progress Notes (Signed)
PROGRESS NOTE  Megan Ortiz LKJ:[id number] DOB: [birth year] DOA: 05/24/2019 PCP: Larene Beach, MD  HPI/Recap of past 24 hours: Patient is a pleasant 43 year old female with morbid obesity, asthma, hypertension, hiatal hernia, bleeding gastric ulcer over 10 years ago while living in [place], history of PE approximately 10 years ago per patient was on anticoagulation that has subsequently been discontinued, history of necrotizing fasciitis who presented to the ED with complaints of 2 days of right leg pain.  Work-up consistent with extensive right lower extremity DVT from right common femoral vein down to the calf veins.  Patient noted to have a leukocytosis of 18.3, hemoglobin of 7.2.  Patient was sent for CT angiogram at [facility] however due to her size unable to fit into the [place] subsequently sent to [facility].  CT angiogram chest which was done was negative for PE however did show a large hiatal hernia with dependent density within the hiatal hernia suggestive of active hemorrhage.  Patient noted to have a prior history of IVC filter placement.  Patient initially placed on heparin however due to concerns for GI bleed heparin discontinued.  Patient noted to have a hemoglobin drop as low as 6.5 and being transfused 2 units of packed red blood cells.  Patient also placed empirically on IV antibiotics due to concern for pneumonia noted on chest x-ray on presentation to the ED.  Patient placed on octreotide drip, started on Protonix drip and GI consulted for further evaluation and management.    Hospital course complicated by acute metabolic encephalopathy with obtundation, severe hypotension requiring several boluses of IV fluid and concentrated IV albumin. CT head, chest, abdomen and pelvis essentially unrevealing.  Ammonia level negative.  No clear source of infection with elevated white count up to 15 point 3K.  Started on IV antibiotics empirically on 7/16.  05/30/19: Patient was  seen and examined at her bedside this morning.  She is lethargic however she is easily arousable to voices.  When asked if she is hurting she nods no.  Blood pressure is improved post initiation of IV antibiotics.  Minimal urine output with AKI, nephrology is following.  Coumadin is on hold due to possible procedure in light of severe AKI.     Assessment/Plan: Principal Problem:   Leg DVT (deep venous thromboembolism), acute, right (HCC) Active Problems:   AKI (acute kidney injury) ([place])   Benign essential HTN   Morbid Obesity    Adjustment disorder with mixed anxiety and depressed mood   Leukocytosis   Right leg pain   Suspected Pulmonary embolus   Community acquired pneumonia   Positive D dimer   Left lower lobe pneumonia ([place])   Hypotension   ARF (acute renal failure) (HCC)   DVT (deep venous thrombosis) (HCC)   Anemia   Morbid obesity (HCC)   Abnormal CT scan, stomach   Persistent Acute metabolic encephalopathy of unclear etiology Possibly multifactorial, secondary to hypotension versus others Per husband patient had one episode of possible seizure activity with facial twitching more than 18 years ago. EEG has been ordered and is pending Still lethargic this morning Ammonia level, UA, CT head, chest, abdomen and pelvis nonrevealing  Worsening AKI with oliguria, likely multifactorial Presented with creatinine of 1.35 and GFR 48 Creatinine continues to trend up 3.84>> .25>>4.78>> 4.63 Renal ultrasound unrevealing No proteinuria on UA Needs strict I's and O's and daily weight to be recorded Nephrology following.  Highly appreciated.  Leukocytosis of unclear etiology Started IV Zosyn on  05/29/2019, continue No clear source of infection UA, chest x-ray unrevealing Procalcitonin 0.46 05/29/19 lactic acid negative White count is trending down Wbc 15K>> 12K Monitor fever and WBC Repeat CBC with differential AM  Symptomatic anemia/possibly acute blood loss anemia in the  setting of chronic microcytic anemia Hemoglobin dropped from 7.4 >> 6.7 FOBT is pending 2 Units PRBC ordered to be transfused Obtain H&H post transfusion  Resolving severe hypotension of unclear etiology Blood pressures are improving this morning  Maintain map greater than 65  Continue to closely monitor vital signs  Resolving acute hypoxic respiratory failure O2 saturation 93% on 3 L ABG reviewed and mainly shows hypoxia with no hypercarbia. Maintain O2 sat >92% O2 Saturation 98% on room air this morning  Hx gastric ulcer GI consulted patient seen in consultation by Dr. Ardis Hughs and patient subsequently underwent upper endoscopy  on 05/25/2019 which showed a large hiatal hernia filled with solid and liquid food, no old or recent blood noted, gastric contents freely reflux to mid esophagus.octreotide drip was discontinued yesterday evening.  Protonix drip discontinued and patient now on oral PPI twice daily.  Status post 2 units packed red blood cells hemoglobin trended back down may be dilutional effect however currently at 7.0 from 8.1 from 6.5 on admission  Acute left lower extremity DVT Coumadin started on 05/26/2019 and bridged with heparin drip  INR 2.0 on 05/30/2019 Coumadin on hold due to possible procedure with nephrology in the setting of severe AKI Pharmacy managing anticoagulant Optimize pain control  Generalized anxiety/depression Continue Zoloft  GERD Continue Protonix p.o. 40 mg twice daily  Severe morbid obesity Patient has a BMI of 88 She would greatly benefit from weight loss  Questionable Left lower lobe pneumonia Initially noted on chest x-ray on admission.  CT angiogram chest which was done was negative for any acute infiltrate and PE.  Leukocytosis elevated.  Patient with no respiratory symptoms.  COVID-19 negative. IV zosyn empirically. Reassess in the am.  DVT prophylaxis: Heparin drip and Coumadin Code Status: Full Family Communication:  Updated the  patient's husband via phone on 05/29/19. Disposition Plan:  undetermined at this time.  Discharge planning once the patient is more alert.   Consultants:   Gastroenterology: Dr. Ardis Hughs 05/25/2019  Nephrology  Procedures:   CT angiogram chest 05/24/2019  2 units packed red blood cell transfusion 05/24/2019>> 05/25/2019  Right lower extremity Doppler 05/24/2019  Chest x-ray 05/24/2019, 05/25/2019  Renal ultrasound 05/24/2019  Upper endoscopy 05/25/2019 per Dr. Ardis Hughs  Antimicrobials:   IV cefepime 05/24/2019>>>>> 05/27/2019  IV vancomycin 05/24/2019>>>>> 05/25/2019  IV zosyn 05/29/19>>   Objective: Vitals:   05/30/19 0500 05/30/19 0948 05/30/19 1015 05/30/19 1033  BP:  (!) 107/54 (!) 105/43 (!) 105/43  Pulse:  88 90 90  Resp:  14 13 13   Temp:  98 F (36.7 C) 97.9 F (36.6 C) 97.9 F (36.6 C)  TempSrc:  Oral  Oral  SpO2:  97%  97%  Weight: (!) 239.5 kg     Height:        Intake/Output Summary (Last 24 hours) at 05/30/2019 1123 Last data filed at 05/30/2019 1026 Gross per 24 hour  Intake 3973.79 ml  Output --  Net 3973.79 ml   Filed Weights   05/27/19 0620 05/30/19 0002 05/30/19 0500  Weight: (!) 241.7 kg (!) 239.5 kg (!) 239.5 kg    Exam:   General: 43 y.o. year-old female severe morbid obesity, lethargic but easily arousable to voices.  Cardiovascular: Regular rate and  rhythm no rubs or gallops.  No JVD or thyromegaly.  Respiratory: No rales no wheezes.  Poor inspiratory effort.  Abdomen: Severe morbid obesity.  Nontender.  Hypoactive bowel sounds..  Psychiatry: Unable to assess due to lethargy  Data Reviewed: CBC: Recent Labs  Lab 05/25/19 0718  05/26/19 0046 05/27/19 0412 05/27/19 1458 05/28/19 0343 05/28/19 2300 05/29/19 0551 05/30/19 0425  WBC 11.9*  --  14.7* 13.2*  --  13.5* 15.0* 15.3* 12.8*  NEUTROABS 9.0*  --  11.4* 10.7*  --  10.8*  --  10.7*  --   HGB 7.1*   < > 8.1* 7.0* 8.1* 7.4* 7.1* 7.4* 6.7*  HCT 25.9*   < > 29.2* 25.4* 29.4*  26.8* 26.2* 27.2* 22.9*  MCV 73.8*  --  76.0* 76.5*  --  76.6* 78.0* 78.2* 76.6*  PLT 295  --  325 344  --  369 341 388 340   < > = values in this interval not displayed.   Basic Metabolic Panel: Recent Labs  Lab 05/25/19 0718 05/26/19 0046 05/27/19 0412 05/28/19 0343 05/28/19 2300 05/29/19 0551 05/30/19 0425  NA 133* 133* 134* 133* 131* 132* 131*  K 4.4 4.5 4.3 4.3 4.2 4.2 4.4  CL 102 104 99 99 98 97* 99  CO2 19* 16* 24 22 20* 21* 19*  GLUCOSE 155* 150* 131* 110* 150* 106* 93  BUN 26* 28* 35* 41* 43* 45* 46*  CREATININE 2.07* 1.93* 2.75* 3.84* 4.25* 4.78* 4.67*  CALCIUM 10.1 9.8 9.9 11.0* 10.9* 11.0* 10.3  MG 1.7 2.3 2.3  --   --   --   --   PHOS  --   --   --   --   --   --  6.7*   GFR: Estimated Creatinine Clearance: 31.5 mL/min (A) (by C-G formula based on SCr of 4.67 mg/dL (H)). Liver Function Tests: Recent Labs  Lab 05/25/19 0718 05/26/19 0046 05/27/19 0412 05/28/19 0343 05/29/19 0551 05/30/19 0425  AST 14* 12* 13* 14* 11*  --   ALT 11 13 13 13 13   --   ALKPHOS 67 68 58 66 65  --   BILITOT 1.0 0.8 0.7 0.9 0.4  --   PROT 7.4 7.7 6.7 7.6 7.2  --   ALBUMIN 3.2* 3.2* 2.7* 2.8* 2.6* 3.8   Recent Labs  Lab 05/29/19 1920  LIPASE 34   Recent Labs  Lab 05/29/19 0551  AMMONIA 19   Coagulation Profile: Recent Labs  Lab 05/26/19 1033 05/27/19 0852 05/28/19 0343 05/29/19 0551 05/30/19 0425  INR 1.2 1.2 1.1 1.6* 2.0*   Cardiac Enzymes: No results for input(s): CKTOTAL, CKMB, CKMBINDEX, TROPONINI in the last 168 hours. BNP (last 3 results) No results for input(s): PROBNP in the last 8760 hours. HbA1C: No results for input(s): HGBA1C in the last 72 hours. CBG: Recent Labs  Lab 05/29/19 1116 05/29/19 1554 05/29/19 2026 05/30/19 0007 05/30/19 0437  GLUCAP 79 73 60* 82 72   Lipid Profile: No results for input(s): CHOL, HDL, LDLCALC, TRIG, CHOLHDL, LDLDIRECT in the last 72 hours. Thyroid Function Tests: No results for input(s): TSH, T4TOTAL,  FREET4, T3FREE, THYROIDAB in the last 72 hours. Anemia Panel: Recent Labs    05/29/19 1030  VITAMINB12 910  FERRITIN 127  TIBC 307  IRON 91  RETICCTPCT 2.5   Urine analysis:    Component Value Date/Time   COLORURINE AMBER (A) 05/27/2019 1300   APPEARANCEUR CLOUDY (A) 05/27/2019 1300   LABSPEC 1.032 (H) 05/27/2019 1300  PHURINE 5.0 05/27/2019 1300   GLUCOSEU NEGATIVE 05/27/2019 1300   HGBUR NEGATIVE 05/27/2019 1300   BILIRUBINUR NEGATIVE 05/27/2019 1300   KETONESUR 5 (A) 05/27/2019 1300   PROTEINUR NEGATIVE 05/27/2019 1300   UROBILINOGEN 0.2 05/11/2013 1903   NITRITE NEGATIVE 05/27/2019 1300   LEUKOCYTESUR NEGATIVE 05/27/2019 1300   Sepsis Labs: @LABRCNTIP (procalcitonin:4,lacticidven:4)  ) Recent Results (from the past 240 hour(s))  Blood Culture (routine x 2)     Status: None   Collection Time: 05/24/19  8:35 AM   Specimen: Left Antecubital; Blood  Result Value Ref Range Status   Specimen Description LEFT ANTECUBITAL Blood Culture adequate volume  Final   Special Requests BOTTLES DRAWN AEROBIC AND ANAEROBIC  Final   Culture   Final    NO GROWTH 5 DAYS Performed at [facility], [address] [place], [place] [address]    Report Status 05/29/2019 FINAL  Final  SARS Coronavirus 2 (CEPHEID - Performed in [place] hospital lab), Hosp Order     Status: None   Collection Time: 05/24/19  8:35 AM   Specimen: Nasopharyngeal Swab  Result Value Ref Range Status   SARS Coronavirus 2 NEGATIVE NEGATIVE Final    Comment: (NOTE) If result is NEGATIVE SARS-CoV-2 target nucleic acids are NOT DETECTED. The SARS-CoV-2 RNA is generally detectable in upper and lower  respiratory specimens during the acute phase of infection. The lowest  concentration of SARS-CoV-2 viral copies this assay can detect is 250  copies / mL. A negative result does not preclude SARS-CoV-2 infection  and should not be used as the sole basis for treatment or other  patient management decisions.  A  negative result may occur with  improper specimen collection / handling, submission of specimen other  than nasopharyngeal swab, presence of viral mutation(s) within the  areas targeted by this assay, and inadequate number of viral copies  (<250 copies / mL). A negative result must be combined with clinical  observations, patient history, and epidemiological information. If result is POSITIVE SARS-CoV-2 target nucleic acids are DETECTED. The SARS-CoV-2 RNA is generally detectable in upper and lower  respiratory specimens dur ing the acute phase of infection.  Positive  results are indicative of active infection with SARS-CoV-2.  Clinical  correlation with patient history and other diagnostic information is  necessary to determine patient infection status.  Positive results do  not rule out bacterial infection or co-infection with other viruses. If result is PRESUMPTIVE POSTIVE SARS-CoV-2 nucleic acids MAY BE PRESENT.   A presumptive positive result was obtained on the submitted specimen  and confirmed on repeat testing.  While 2019 novel coronavirus  (SARS-CoV-2) nucleic acids may be present in the submitted sample  additional confirmatory testing may be necessary for epidemiological  and / or clinical management purposes  to differentiate between  SARS-CoV-2 and other Sarbecovirus currently known to infect humans.  If clinically indicated additional testing with an alternate test  methodology [phone]) is advised. The SARS-CoV-2 RNA is generally  detectable in upper and lower respiratory sp ecimens during the acute  phase of infection. The expected result is Negative. Fact Sheet for Patients:  StrictlyIdeas.no Fact Sheet for Healthcare Providers: BankingDealers.co.za This test is not yet approved or cleared by the [place] FDA and has been authorized for detection and/or diagnosis of SARS-CoV-2 by FDA under an Emergency Use  Authorization (EUA).  This EUA will remain in effect (meaning this test can be used) for the duration of the COVID-19 declaration under Section 564(b)(1) of  the Act, 21 U.S.C. section 360bbb-3(b)(1), unless the authorization is terminated or revoked sooner. Performed at [facility], [address], [place], [place] [address]   Blood Culture (routine x 2)     Status: None   Collection Time: 05/24/19  8:36 AM   Specimen: BLOOD LEFT HAND  Result Value Ref Range Status   Specimen Description BLOOD LEFT HAND Blood Culture adequate volume  Final   Special Requests BOTTLES DRAWN AEROBIC AND ANAEROBIC  Final   Culture   Final    NO GROWTH 5 DAYS Performed at [facility], [address] [place], [place] [address]    Report Status 05/29/2019 FINAL  Final  Urine Culture     Status: None   Collection Time: 05/25/19  3:50 AM   Specimen: Urine, Catheterized  Result Value Ref Range Status   Specimen Description URINE, CATHETERIZED  Final   Special Requests NONE  Final   Culture   Final    NO GROWTH Performed at [place] [address] [place], [place] [address]    Report Status 05/26/2019 FINAL  Final  Urine Culture     Status: None   Collection Time: 05/27/19  8:12 AM   Specimen: Urine, Random  Result Value Ref Range Status   Specimen Description URINE, RANDOM  Final   Special Requests NONE  Final   Culture   Final    NO GROWTH Performed at [facility], [place] [address] [place], [place] [address]    Report Status 05/28/2019 FINAL  Final  MRSA PCR Screening     Status: None   Collection Time: 05/29/19  6:46 PM   Specimen: Nasal Mucosa; Nasopharyngeal  Result Value Ref Range Status   MRSA by PCR NEGATIVE NEGATIVE Final    Comment:        The GeneXpert MRSA Assay (FDA approved for NASAL specimens only), is one component of a comprehensive MRSA colonization surveillance program. It is not intended to diagnose MRSA infection nor to guide or monitor treatment  for MRSA infections. Performed at [facility], [place] [address] [place], [place] [address]       Studies: Ct Abdomen Pelvis Wo Contrast  Result Date: 05/29/2019 CLINICAL DATA:  Encephalopathy. EXAM: CT CHEST, ABDOMEN AND PELVIS WITHOUT CONTRAST TECHNIQUE: Multidetector CT imaging of the chest, abdomen and pelvis was performed following the standard protocol without IV contrast. COMPARISON:  CTA chest 05/24/2019 FINDINGS: CT CHEST FINDINGS Cardiovascular: The heart size is normal. No substantial pericardial effusion. No thoracic aortic aneurysm. Mediastinum/Nodes: No mediastinal lymphadenopathy. No evidence for gross hilar lymphadenopathy although assessment is limited by the lack of intravenous contrast on today's study. The esophagus has normal imaging features. There is no axillary lymphadenopathy. Large hiatal hernia. Lungs/Pleura: No suspicious pulmonary nodule or mass. No focal airspace consolidation. Minimal atelectasis noted in the posterior bases, left greater than right. No pleural effusion. Musculoskeletal: No worrisome lytic or sclerotic osseous abnormality. CT ABDOMEN PELVIS FINDINGS Hepatobiliary: No focal abnormality in the liver on this study without intravenous contrast. Gallbladder is surgically absent. No intrahepatic or extrahepatic biliary dilation. Pancreas: Pancreas is poorly visualized and a degree of pancreatic and peripancreatic edema cannot be excluded. Spleen: No splenomegaly. No focal mass lesion. Adrenals/Urinary Tract: No adrenal nodule or mass. No gross renal mass or hydronephrosis. No definite hydroureter. And time a pelvis obscured by artifact from large body habitus. Stomach/Bowel: Large hiatal hernia. No small bowel dilatation to suggest obstruction. No colonic dilatation. Small bowel  loops and colonic segments of the pelvis are obscured. Vascular/Lymphatic: No abdominal aortic aneurysm. IVC filter noted in situ. No bulky abdominal lymphadenopathy. Pelvic sidewalls  are obscured. Reproductive: Obscured. Other: No gross free fluid in the abdomen.  Pelvis is obscured. Musculoskeletal: No worrisome lytic or sclerotic osseous abnormality. IMPRESSION: 1. Markedly limited study due to technical factors related to the patient's large body habitus. 2. Pancreas is not well seen but intra pancreatic and peripancreatic is suggested although this may be artifactual. As such correlation for pancreatitis recommended. 3. Large hiatal hernia. Electronically Signed   By: Misty Stanley M.D.   On: 05/29/2019 18:42   Ct Head Wo Contrast  Result Date: 05/29/2019 CLINICAL DATA:  Encephalopathy.  Morbid obesity EXAM: CT HEAD WITHOUT CONTRAST TECHNIQUE: Contiguous axial images were obtained from the base of the skull through the vertex without intravenous contrast. COMPARISON:  None. FINDINGS: Brain: Examination is markedly limited due to motion. The patient was not able to fit into the scanner with arms down therefore the arms above the head causing significant artifact. Ventricle size normal. No hemorrhage mass or midline shift. Early ischemic infarction or brain edema would be difficult to identify on the study. IMPRESSION: Markedly limited study.  No acute abnormality. Electronically Signed   By: Franchot Gallo M.D.   On: 05/29/2019 18:54   Ct Chest Wo Contrast  Result Date: 05/29/2019 CLINICAL DATA:  Encephalopathy. EXAM: CT CHEST, ABDOMEN AND PELVIS WITHOUT CONTRAST TECHNIQUE: Multidetector CT imaging of the chest, abdomen and pelvis was performed following the standard protocol without IV contrast. COMPARISON:  CTA chest 05/24/2019 FINDINGS: CT CHEST FINDINGS Cardiovascular: The heart size is normal. No substantial pericardial effusion. No thoracic aortic aneurysm. Mediastinum/Nodes: No mediastinal lymphadenopathy. No evidence for gross hilar lymphadenopathy although assessment is limited by the lack of intravenous contrast on today's study. The esophagus has normal imaging features.  There is no axillary lymphadenopathy. Large hiatal hernia. Lungs/Pleura: No suspicious pulmonary nodule or mass. No focal airspace consolidation. Minimal atelectasis noted in the posterior bases, left greater than right. No pleural effusion. Musculoskeletal: No worrisome lytic or sclerotic osseous abnormality. CT ABDOMEN PELVIS FINDINGS Hepatobiliary: No focal abnormality in the liver on this study without intravenous contrast. Gallbladder is surgically absent. No intrahepatic or extrahepatic biliary dilation. Pancreas: Pancreas is poorly visualized and a degree of pancreatic and peripancreatic edema cannot be excluded. Spleen: No splenomegaly. No focal mass lesion. Adrenals/Urinary Tract: No adrenal nodule or mass. No gross renal mass or hydronephrosis. No definite hydroureter. And time a pelvis obscured by artifact from large body habitus. Stomach/Bowel: Large hiatal hernia. No small bowel dilatation to suggest obstruction. No colonic dilatation. Small bowel loops and colonic segments of the pelvis are obscured. Vascular/Lymphatic: No abdominal aortic aneurysm. IVC filter noted in situ. No bulky abdominal lymphadenopathy. Pelvic sidewalls are obscured. Reproductive: Obscured. Other: No gross free fluid in the abdomen.  Pelvis is obscured. Musculoskeletal: No worrisome lytic or sclerotic osseous abnormality. IMPRESSION: 1. Markedly limited study due to technical factors related to the patient's large body habitus. 2. Pancreas is not well seen but intra pancreatic and peripancreatic is suggested although this may be artifactual. As such correlation for pancreatitis recommended. 3. Large hiatal hernia. Electronically Signed   By: Misty Stanley M.D.   On: 05/29/2019 18:42    Scheduled Meds:  sodium chloride   Intravenous Once   docusate sodium  100 mg Oral BID   ferrous sulfate  325 mg Oral BID WC   montelukast  10 mg Oral  Daily   pantoprazole  40 mg Oral BID AC   sodium chloride flush  10-40 mL  Intracatheter Q12H   sodium chloride flush  3 mL Intravenous Q12H   topiramate  50 mg Oral Daily   warfarin  7.5 mg Oral ONCE-1800   Warfarin - Pharmacist Dosing Inpatient   Does not apply q1800    Continuous Infusions:  sodium chloride     sodium chloride 100 mL/hr at 05/29/19 2243   dextrose 100 mL/hr at 05/30/19 0208   heparin 2,650 Units/hr (05/30/19 0209)   phenylephrine (NEO-SYNEPHRINE) Adult infusion     piperacillin-tazobactam (ZOSYN)  IV 3.375 g (05/30/19 0345)     LOS: 6 days     Kayleen Memos, MD [facility] Pager [phone]  If 7PM-7AM, please contact night-coverage [email or web address] Password [facility] 05/30/2019, 11:23 AM

## 2019-05-30 NOTE — Progress Notes (Signed)
Bedside EEG completed, results pending. 

## 2019-05-30 NOTE — Consult Note (Addendum)
Neurology Consultation  Reason for Consult: Concern for abnormal EEG, progressively worsening altered mental status Referring Physician: Dr. Francia Greaves  CC: Concern for abnormal EEG done because of progressively worsening altered mental status  History is obtained from: Chart review  HPI: Megan Ortiz is a 43 y.o. female who has a significant past medical history- morbid obesity, DVT, PE, currently on Coumadin, hypertension, headaches, history of necrotizing fasciitis, bleeding gastric ulcers presenting to the emergency room on 05/24/2019 with sharp pain in her right leg that was out of ordinary for anything she is ever had. She had no preceding illnesses or sicknesses.  She was evaluated by the medicine team and admitted to the hospital.  Found to have a large DVT in her right leg along with elevated D-dimers. Hemoglobin was low and dropped further requiring PRBC transfusions. Her renal function also deteriorated significantly with creatinine on admission 1.35 and BUN of 19 with creatinine of 4.67 today and a BUN of 46. Neurology was consulted when EEG was obtained for her progressive altered mental status, much worse over the past 2 days. 2 days ago, she was able to play video games on her iPad and today she looks very confused with eyes open, not talking much and not following commands consistently. She is unable to provide any history at this time  TGY:BWLSLH to obtain due to altered mental status.   Past Medical History:  Diagnosis Date  . Anemia   . Anxiety   . Asthma   . Gastric ulcer   . Headache   . Hiatal hernia   . Hypertension   . PE (pulmonary embolism)   . Tachycardia     Family History  Problem Relation Age of Onset  . Depression Mother   . Alcohol abuse Mother    Social History:   reports that she has quit smoking. She has never used smokeless tobacco. She reports current alcohol use. She reports that she does not use drugs.  Medications  Current  Facility-Administered Medications:  .  0.9 %  sodium chloride infusion (Manually program via Guardrails IV Fluids), , Intravenous, Once, Hall, Carole N, DO .  0.9 %  sodium chloride infusion, 250 mL, Intravenous, PRN, [name], MD .  0.9 %  sodium chloride infusion, , Intravenous, Continuous, Justin Mend, MD, Last Rate: 100 mL/hr at 05/29/19 2243 .  acetaminophen (TYLENOL) tablet 650 mg, 650 mg, Oral, Q6H PRN **OR** acetaminophen (TYLENOL) suppository 650 mg, 650 mg, Rectal, Q6H PRN, [name], MD .  albuterol (PROVENTIL) (2.5 MG/3ML) 0.083% nebulizer solution 3 mL, 3 mL, Inhalation, Q6H PRN, [name], MD .  dextrose 10 % infusion, , Intravenous, Continuous, Blount, Xenia T, NP, Last Rate: 100 mL/hr at 05/30/19 1220 .  docusate sodium (COLACE) capsule 100 mg, 100 mg, Oral, BID, [name], MD, 100 mg at 05/30/19 0945 .  ferrous sulfate tablet 325 mg, 325 mg, Oral, BID WC, Hall, Carole N, DO, 325 mg at 05/30/19 0945 .  heparin ADULT infusion 100 units/mL (25000 units/25mL sodium chloride 0.45%), 2,650 Units/hr, Intravenous, Continuous, Rolla Flatten, [facility], Last Rate: 26.5 mL/hr at 05/30/19 1224, 2,650 Units/hr at 05/30/19 1224 .  LORazepam (ATIVAN) injection 1 mg, 1 mg, Intravenous, Once PRN, Amie Portland, MD .  montelukast (SINGULAIR) tablet 10 mg, 10 mg, Oral, Daily, [name], MD, 10 mg at 05/30/19 0945 .  ondansetron (ZOFRAN) tablet 4 mg, 4 mg, Oral, Q8H PRN, [name], MD, 4 mg at 05/27/19  1823 .  pantoprazole (PROTONIX) EC tablet 40 mg, 40 mg, Oral, BID AC, Eugenie Filler, MD, 40 mg at 05/30/19 0945 .  phenylephrine (NEOSYNEPHRINE) 10-0.9 MG/250ML-% infusion, 0-400 mcg/min, Intravenous, Titrated, Hall, Carole N, DO .  piperacillin-tazobactam (ZOSYN) IVPB 3.375 g, 3.375 g, Intravenous, Q8H, Ko, Christine, RPH, Last Rate: 12.5 mL/hr at 05/30/19 1222, 3.375 g at 05/30/19 1222 .  sodium chloride flush (NS) 0.9 % injection 10-40 mL, 10-40  mL, Intracatheter, Q12H, [name], MD, 10 mL at 05/30/19 1025 .  sodium chloride flush (NS) 0.9 % injection 10-40 mL, 10-40 mL, Intracatheter, PRN, [name], MD .  sodium chloride flush (NS) 0.9 % injection 3 mL, 3 mL, Intravenous, Q12H, [name], MD, 3 mL at 05/30/19 1026 .  sodium chloride flush (NS) 0.9 % injection 3 mL, 3 mL, Intravenous, PRN, [name], MD .  topiramate (TOPAMAX) tablet 50 mg, 50 mg, Oral, Daily, [name], MD, 50 mg at 05/30/19 0945 .  warfarin (COUMADIN) tablet 7.5 mg, 7.5 mg, Oral, ONCE-1800, Nwogu, Ivy A, RPH .  Warfarin - Pharmacist Dosing Inpatient, , Does not apply, q1800, Henri Medal, [facility]   Exam: Current vital signs: BP (!) 129/52   Pulse 92   Temp 97.9 F (36.6 C) (Oral)   Resp 16   Ht 5\' 5"  (1.651 m)   Wt (!) 239.5 kg   LMP 05/05/2019 (Approximate)   SpO2 98%   BMI 87.86 kg/m  Vital signs in last 24 hours: Temp:  [96.2 F (35.7 C)-98 F (36.7 C)] 97.9 F (36.6 C) (07/17 1438) Pulse Rate:  [87-97] 92 (07/17 1438) Resp:  [13-20] 16 (07/17 1438) BP: (87-129)/(43-102) 129/52 (07/17 1438) SpO2:  [95 %-100 %] 98 % (07/17 1438) Weight:  [239.5 kg] 239.5 kg (07/17 0500) General: Morbidly obese woman laying rather uncomfortably in bed but exhibits no facial expression of discomfort. HEENT: Normocephalic atraumatic Lungs: Very difficult to auscultate due to size CVS: Very difficult to hear heart sounds due to size Abdomen: Morbidly obese, nontender Extremities: Swollen right extremity, morbidly obese left extremity. Neurological exam She is awake, alert. She is able to tell me her name. She is unable to follow much of any commands and requires a lot of redirection otherwise starts gazing in all directions purposelessly. Difficult assess if she has any dysarthria or not. Cranial nerves: Pupils equal round react light, extraocular movements appear intact based on her looking around, blinks to threat from both  sides, face appears symmetric, tongue midline. Motor exam: She is antigravity in both her upper extremities with mild asterixis.  For both lower extremities she is at least able to attempt to raise her right lower extremity antigravity- 3/5 and may be similarly on the left lower extremity. Sensory exam: Grimaces to noxious stimulation in all fours Coordination difficult to assess that she does not follow commands consistently Gait not tested for her safety due to altered mentation.  Labs I have reviewed labs in epic and the results pertinent to this consultation are:  CBC    Component Value Date/Time   WBC 12.8 (H) 05/30/2019 0425   RBC 2.99 (L) 05/30/2019 0425   HGB 6.7 (LL) 05/30/2019 0425   HCT 22.9 (L) 05/30/2019 0425   HCT 22.8 (L) 05/29/2019 1030   PLT 340 05/30/2019 0425   MCV 76.6 (L) 05/30/2019 0425   MCH 22.4 (L) 05/30/2019 0425   MCHC 29.3 (L) 05/30/2019 0425   RDW 23.3 (H) 05/30/2019 0425  LYMPHSABS 2.0 05/29/2019 0551   MONOABS 1.0 05/29/2019 0551   EOSABS 0.8 (H) 05/29/2019 0551   BASOSABS 0.1 05/29/2019 0551    CMP     Component Value Date/Time   NA 131 (L) 05/30/2019 0425   NA 132 (A) 03/09/2017   K 4.4 05/30/2019 0425   CL 99 05/30/2019 0425   CO2 19 (L) 05/30/2019 0425   GLUCOSE 93 05/30/2019 0425   BUN 46 (H) 05/30/2019 0425   BUN 10 03/09/2017   CREATININE 4.67 (H) 05/30/2019 0425   CALCIUM 10.3 05/30/2019 0425   PROT 7.2 05/29/2019 0551   ALBUMIN 3.8 05/30/2019 0425   AST 11 (L) 05/29/2019 0551   ALT 13 05/29/2019 0551   ALKPHOS 65 05/29/2019 0551   BILITOT 0.4 05/29/2019 0551   GFRNONAA 11 (L) 05/30/2019 0425   GFRAA 12 (L) 05/30/2019 0425  From 05/26/2021 today creatinine is jumped up from 2.75-4.67.  BUN is up from 35-46. Ammonia was normal yesterday.  Imaging I have reviewed the images obtained: CT-scan of the brain-extremely poor quality and motion riddled-although read as no gross abnormality, I am not sure if any gross abnormality can  be ruled out with such a poor quality scan.   Assessment:  43 year old woman significant past medical history of morbid obesity, DVT, PE, currently on anticoagulation, hypertension, headaches, history of necrotizing fasciitis and bleeding gastric ulcers presenting to the emergency room with sharp pain in her right leg, found to have a DVT, being treated for DVT in house, started having deranged renal function worsening and over the past 2 days also had severe decline in her mentation-appearing more confused. On my examination she is severely encephalopathic with mild asterixis on outstretched arms with no other focal findings. EEG done today was reviewed with Dr. Doy Mince and it does not reveal any seizures-within the constraints of some artifact, it appears more most consistent with metabolic encephalopathy. But my exam and the EEG findings point towards a toxic metabolic reason for her underlying encephalopathy and I would imagine that it is the severely deranged renal function, such acutely, that has caused her mental status to decompensate. That said, she arrived extremely hypotensive and bilateral watershed infarcts can also be an underlying cause and I would like that evaluated further. Other differentials due to her morbid obesity and altered mental status could be obstructive sleep apnea and or OHS.  Impression: Evaluate for toxic metabolic encephalopathy Evaluate for bilateral watershed strokes versus hemorrhagic stroke as she is anticoagulated Needs outpatient evaluation for obstructive sleep apnea and/or OHS.  Recommendations: Management of the elevated creatinine and regional function per primary team and nephrology.  Repeat the CT scan- it would be okay to try a small amount of sedation like Ativan 1 mg IV to try to get a better picture of her brain. Keep sedating medications minimized as you have already. I would also check another ammonia level, ABG and TSH along with B12 levels as  well. I will follow-up after imaging is completed. -- Amie Portland, MD [facility] Pager: [phone] If 7pm to 7am, please call on call as listed on AMION.

## 2019-05-30 NOTE — Procedures (Signed)
ELECTROENCEPHALOGRAM REPORT   Patient: Megan Ortiz       Room #: 1G62I  Age: 43 y.o.        Sex: female Referring Physician: Nevada Crane Report Date:  05/30/2019        Interpreting Physician: Alexis Goodell  History: Aaliyana Fredericks is an 43 y.o. female with altered mental status  Medications:  Coumadin, Topamax, Zosyn, Heparin   Conditions of Recording:  This is a 21 channel routine scalp EEG performed with bipolar and monopolar montages arranged in accordance to the international 10/20 system of electrode placement. One channel was dedicated to EKG recording.  The patient is in the altered and uncooperative state.  Description:  Artifact is prominent during the recording often obscuring the background rhythm. When able to be visualized the background is slow and poorly organized.   It consists of a moderate to high voltage mixture of delta and theta activity.  This slow, irregular activity is diffusely distributed and persistent throughout the recording.  There are also intermittent, frequent discharges of triphasic morphology that at independently noted over both hemispheres, again in a disorganized array.  Hyperventilation and intermittent photic stimulation were not performed  IMPRESSION: This is an abnormal electroencephalogram due to a disorganized, slow background with frequent triphasic wave activity.  This is most consistent with an encephalopathy.  Although most often seen with hepatic encephalopathies, it is not isolated to this clinical scenario.   Alexis Goodell, MD Neurology [phone] 05/30/2019, 2:59 PM

## 2019-05-30 NOTE — Progress Notes (Addendum)
ANTICOAGULATION AND ANTIBIOTIC CONSULT NOTE - Follow Up Consult  Pharmacy Consult for heparin and warfarin Indication: DVT  Allergies  Allergen Reactions  . Aspirin     Due to hx of stomach ulcers    Patient Measurements: Height: 5\' 5"  (165.1 cm) Weight: (!) 528 lb (239.5 kg) IBW/kg (Calculated) : 57 Heparin Dosing Weight: 120 kg  Vital Signs: Temp: 97.9 F (36.6 C) (07/17 1033) Temp Source: Oral (07/17 1033) BP: 105/43 (07/17 1033) Pulse Rate: 90 (07/17 1033)  Labs: Recent Labs    05/28/19 0343 05/28/19 2300 05/29/19 0551 05/30/19 0425  HGB 7.4* 7.1* 7.4* 6.7*  HCT 26.8* 26.2* 27.2* 22.9*  PLT 369 341 388 340  LABPROT 14.5  --  19.2* 22.7*  INR 1.1  --  1.6* 2.0*  HEPARINUNFRC 0.50  --  0.59 0.31  CREATININE 3.84* 4.25* 4.78* 4.67*    Estimated Creatinine Clearance: 31.5 mL/min (A) (by C-G formula based on SCr of 4.67 mg/dL (H)).   Assessment: 43 yo female with extensive RLE DVT  Anticoag:  Heparin level 0.31, remains therapeutic on IV heparin 2650 units/hr.  INR 2.0 on day 5 of warfarin therapy. Warfarin dose ordered for 05/29/19 was not charted as given. Hgb decreased to 6.7.  Plts stable. No signs of active bleeding.  Orders per MD to transfuse 2 units PRBCs today. - Warfarin -nephrology noted 7/15 that in light of worsening renal function and possible need for catheter placement will hold coumadin. I will follow up with Dr. Nevada Crane on her plan for warfarin.     Goal of Therapy:  INR 2-3 Heparin level 0.3-0.7 units/ml Monitor platelets by anticoagulation protocol: Yes   Plan:  Continue heparin 2650 units/hr Monitor daily heparin level, INR and CBC -will f/u with Dr. Nevada Crane if plan is to hold warfarin as nephrology noted on 7/15 Monitor for S&S of bleeding    Nicole Cella, [place] Pharmacist [phone] Please check AMION for all [place] phone numbers After 10:00 PM, call [place] 05/30/2019,10:43 AM   ADDENDUM:   Dr. Nevada Crane said to  Hold warfarin  PLAN:  Discontinued warfarin and pharmacy warfarin consult for now.   - f/u with Dr. Nevada Crane when okay to restart warfarin if renal function improves.    Nicole Cella, RPh Clinical Pharmacist 05/30/2019 5:02 PM

## 2019-05-30 NOTE — Progress Notes (Signed)
Occupational Therapy Treatment Patient Details Name: Megan Ortiz MRN: [id number] DOB: 08-Nov-1976 Today's Date: 05/30/2019    History of present illness Patient is a pleasant 43 year old female with morbid obesity, asthma, hypertension, hiatal hernia, bleeding gastric ulcer over 10 years ago while living in [place], history of PE approximately 10 years ago per patient was on anticoagulation that has subsequently been discontinued, history of necrotizing fasciitis who presented to the ED with complaints of 2 days of right leg pain.  Work-up consistent with extensive right lower extremity DVT from right common femoral vein down to the calf veins.  Patient noted to have a leukocytosis of 18.3, hemoglobin of 7.2.  Has IVC filter.  Also underwent Esophagogastroduodenoscopy.     OT comments  RN approved therapy session. Pt received supine in bed asleep. Pt awoke pleasantly to knocking. Pt limited this session secondary to level of arousal. Pt inconsistently followed one step commands. She currently requires minA to initiation of grooming task and totalA to sip water. Pt educated on importance of participation during session. RN aware of pt's cognition. D/C plan updated this session, recommend SNF level therapy at d/c to maximize safety and independence with ADL. Pt will continue to benefit from skilled OT services to maximize safety and independence with ADL/IADL and functional mobility. Will continue to follow acutely and progress as tolerated.    Follow Up Recommendations  SNF;Supervision/Assistance - 24 hour    Equipment Recommendations  Hospital bed;3 in 1 bedside commode    Recommendations for Other Services Speech consult    Precautions / Restrictions Precautions Precautions: Fall Restrictions Weight Bearing Restrictions: No       Mobility Bed Mobility               General bed mobility comments: deferred secondary to arousal  Transfers                 General  transfer comment: deferred    Balance                                           ADL either performed or assessed with clinical judgement   ADL Overall ADL's : Needs assistance/impaired Eating/Feeding: Total assistance Eating/Feeding Details (indicate cue type and reason): provided pt with cup of water to drink, pt did not engage in actively holding cup Grooming: Wash/dry face;Minimal assistance Grooming Details (indicate cue type and reason): minA for task initiation                               General ADL Comments: deferred functional mobiltiy secondary to pt's level of arousal     Vision       Perception     Praxis      Cognition Arousal/Alertness: Awake/alert Behavior During Therapy: Flat affect Overall Cognitive Status: Difficult to assess Area of Impairment: Orientation;Attention;Following commands;Safety/judgement;Awareness;Problem solving                 Orientation Level: Disoriented to;Place;Time;Situation Current Attention Level: Focused   Following Commands: Follows one step commands inconsistently Safety/Judgement: Decreased awareness of safety;Decreased awareness of deficits Awareness: Intellectual Problem Solving: Slow processing;Decreased initiation;Difficulty sequencing;Requires verbal cues;Requires tactile cues General Comments: Pt with limited engagement during session;would inconsistently follow commands;pt did not state name or birthdate;pt intermittently responded to yes/no questions;required max vc to open eyes;pt would appear to  stare past OT when looking at OT, pt would engage in eye contact when encouraged to look at therapist     Upper Extremity Assessment RUE: no AROM shoulder flexion this date;limited PROM shoulder flexion due to pain;full AROM elbow flexion/extension;limited AROM digit extension;grip strength 4+/5 TOI:ZTIW AROM elbow flexion/extension;full PROM shoulder flexion;WNL digit  flexion/extension;grip strength 4+/5   Exercises Exercises: Low Level/ICU Other Exercises Other Exercises: PROM UE exercises performed   Shoulder Instructions       General Comments educated pt on importance of participating in session, pt appeared to engage more immediately after this education, but quickly disengaged and closed eyes;RN aware of pt's level of arousal    Pertinent Vitals/ Pain       Pain Assessment: Faces Faces Pain Scale: Hurts little more Pain Location: RUE with ROM Pain Descriptors / Indicators: Grimacing Pain Intervention(s): Limited activity within patient's tolerance;Monitored during session  Home Living                                          Prior Functioning/Environment              Frequency  Min 2X/week        Progress Toward Goals  OT Goals(current goals can now be found in the care plan section)  Progress towards OT goals: Not progressing toward goals - comment(pt's level of arousal)  Acute Rehab OT Goals Patient Stated Goal: pt did not sate at this time OT Goal Formulation: With patient Time For Goal Achievement: 06/09/19 Potential to Achieve Goals: Good ADL Goals Pt Will Perform Lower Body Bathing: with modified independence;sit to/from stand Pt Will Perform Upper Body Dressing: with modified independence;sitting Pt Will Perform Lower Body Dressing: with modified independence;sit to/from stand Pt Will Transfer to Toilet: with modified independence;bedside commode;ambulating Pt/caregiver will Perform Home Exercise Program: With theraband;Both right and left upper extremity;Independently;With written HEP provided;Increased strength  Plan Discharge plan needs to be updated    Co-evaluation                 AM-PAC OT "6 Clicks" Daily Activity     Outcome Measure   Help from another person eating meals?: Total Help from another person taking care of personal grooming?: A Little Help from another person  toileting, which includes using toliet, bedpan, or urinal?: Total Help from another person bathing (including washing, rinsing, drying)?: Total Help from another person to put on and taking off regular upper body clothing?: Total Help from another person to put on and taking off regular lower body clothing?: Total 6 Click Score: 8    End of Session    OT Visit Diagnosis: Unsteadiness on feet (R26.81);Muscle weakness (generalized) (M62.81);Pain Pain - Right/Left: Right Pain - part of body: Shoulder   Activity Tolerance Other (comment)(session limited due to pt's level of arousal)   Patient Left in bed;with call bell/phone within reach;with bed alarm set   Nurse Communication Mobility status;Other (comment)(pts level of arousal)        Time: 5809-9833 OT Time Calculation (min): 13 min  Charges: OT General Charges $OT Visit: 1 Visit OT Treatments $Self Care/Home Management : 8-22 mins  Dorinda Hill OTR/L Acute Rehabilitation Services Office: [place] 05/30/2019, 11:43 AM

## 2019-05-30 NOTE — Progress Notes (Signed)
PT Cancellation Note  Patient Details Name: Cameren Odwyer MRN: [id number] DOB: 1976/03/01   Cancelled Treatment:    Reason Eval/Treat Not Completed: Medical issues which prohibited therapy Pt with Hgb 6.7. will attempt treatment at later time as appropriate  DONAWERTH,KAREN 05/30/2019, 9:15 AM

## 2019-05-30 NOTE — Progress Notes (Signed)
CRITICAL VALUE ALERT  Critical Value:  hgb 6.7  Date & Time Notied:  5.08am 05/30/19  Provider Notified: X blount [facility]  Orders Received/Actions taken: Pt needs type and screen hers expired on 05/27/19. Order for type and screen placed by X blount. No further orders at this time

## 2019-05-30 NOTE — Progress Notes (Signed)
[facility]    NEPHROLOGY PROGRESS NOTE  SUBJECTIVE:   Patient seen and examined.  Patient with eyes open, moves head to voice.  Answer simple questions.  Does not follow commands.   OBJECTIVE:  Vitals:   05/30/19 1100 05/30/19 1213  BP: (!) 115/102 (!) 109/48  Pulse: 93 92  Resp: 14 15  Temp:  97.8 F (36.6 C)  SpO2: 98% 100%    Intake/Output Summary (Last 24 hours) at 05/30/2019 1339 Last data filed at 05/30/2019 1213 Gross per 24 hour  Intake 3993.79 ml  Output -  Net 3993.79 ml      General:  AAOx3 NAD HEENT: MMM [place] AT anicteric sclera Neck:  No JVD, no adenopathy CV:  Heart RRR  Lungs:  L/S CTA bilaterally Abd:  distended with normal BS GU:  Bladder non-palpable Extremities:  (+)3 bilateral LE edema. Skin:  No skin rash  MEDICATIONS:  . sodium chloride   Intravenous Once  . docusate sodium  100 mg Oral BID  . ferrous sulfate  325 mg Oral BID WC  . montelukast  10 mg Oral Daily  . pantoprazole  40 mg Oral BID AC  . sodium chloride flush  10-40 mL Intracatheter Q12H  . sodium chloride flush  3 mL Intravenous Q12H  . topiramate  50 mg Oral Daily  . warfarin  7.5 mg Oral ONCE-1800  . Warfarin - Pharmacist Dosing Inpatient   Does not apply q1800       LABS:   CBC Latest Ref Rng & Units 05/30/2019 05/29/2019 05/29/2019  WBC 4.0 - 10.5 K/uL 12.8(H) 15.3(H) -  Hemoglobin 12.0 - 15.0 g/dL 6.7(LL) 7.4(L) -  Hematocrit 36.0 - 46.0 % 22.9(L) 22.8(L) 27.2(L)  Platelets 150 - 400 K/uL 340 388 -    CMP Latest Ref Rng & Units 05/30/2019 05/29/2019 05/28/2019  Glucose 70 - 99 mg/dL 93 106(H) 150(H)  BUN 6 - 20 mg/dL 46(H) 45(H) 43(H)  Creatinine 0.44 - 1.00 mg/dL 4.67(H) 4.78(H) 4.25(H)  Sodium 135 - 145 mmol/L 131(L) 132(L) 131(L)  Potassium 3.5 - 5.1 mmol/L 4.4 4.2 4.2  Chloride 98 - 111 mmol/L 99 97(L) 98  CO2 22 - 32 mmol/L 19(L) 21(L) 20(L)  Calcium 8.9 - 10.3 mg/dL 10.3 11.0(H) 10.9(H)  Total Protein 6.5 - 8.1 g/dL - 7.2 -  Total Bilirubin 0.3  - 1.2 mg/dL - 0.4 -  Alkaline Phos 38 - 126 U/L - 65 -  AST 15 - 41 U/L - 11(L) -  ALT 0 - 44 U/L - 13 -    Lab Results  Component Value Date   CALCIUM 10.3 05/30/2019   PHOS 6.7 (H) 05/30/2019       Component Value Date/Time   COLORURINE AMBER (A) 05/27/2019 1300   APPEARANCEUR CLOUDY (A) 05/27/2019 1300   LABSPEC 1.032 (H) 05/27/2019 1300   PHURINE 5.0 05/27/2019 1300   GLUCOSEU NEGATIVE 05/27/2019 1300   HGBUR NEGATIVE 05/27/2019 1300   BILIRUBINUR NEGATIVE 05/27/2019 1300   KETONESUR 5 (A) 05/27/2019 1300   PROTEINUR NEGATIVE 05/27/2019 1300   UROBILINOGEN 0.2 05/11/2013 1903   NITRITE NEGATIVE 05/27/2019 1300   LEUKOCYTESUR NEGATIVE 05/27/2019 1300      Component Value Date/Time   PHART 7.307 (L) 05/29/2019 0959   PCO2ART 41.6 05/29/2019 0959   PO2ART 60.9 (L) 05/29/2019 0959   HCO3 20.5 05/29/2019 0959   TCO2 21 02/19/2017 0856   ACIDBASEDEF 4.9 (H) 05/29/2019 0959   O2SAT 89.6 05/29/2019 0959  Component Value Date/Time   IRON 91 05/29/2019 1030   TIBC 307 05/29/2019 1030   FERRITIN 127 05/29/2019 1030   IRONPCTSAT 30 05/29/2019 1030       ASSESSMENT/PLAN:    **AKI: Likely multifactorial AKI with contributions of volume depletion +modest hypotension + contrast administration + anemia. UA repeat shows no blood or protein, Up/C 0.1.  Her renal US did show discrepant size of kidneys (~13 and ~10cm) but I don't this this is causing an acute issue -- likely it's congenital or at least a chronic issue; no indication for RAS workup at this time).  UOP and creat improving.    **AMS:  Really only responding to pain at this time.  Not uremia - BUN only in 40s.  No opioids since 7/14.  workup per primary.  Doubt related to renal disease    **Metabolic acidosis: improved, now off bicarb gtt.    **DVT: RLE. On heparin gtt bridging to coumadin.  In light of worsening renal function and possible need for catheter placement will hold coumadin; d/w hospitalist.  Has old IVC filter noted on imaging.   **anemia, microcytic: s/p pRBC. Has chronic anemia in setting of heavy menstrual cycles. Receiving IV iron feraheme here as well.  No indication for ESA given this is AKI.    **morbid obesity: BMI 89.     Ezel, DO, [place]

## 2019-05-31 DIAGNOSIS — G9340 Encephalopathy, unspecified: Secondary | ICD-10-CM

## 2019-05-31 DIAGNOSIS — D500 Iron deficiency anemia secondary to blood loss (chronic): Secondary | ICD-10-CM

## 2019-05-31 LAB — TYPE AND SCREEN
ABO/RH(D): O POS
Antibody Screen: NEGATIVE
Unit division: 0
Unit division: 0

## 2019-05-31 LAB — CBC
HCT: 29.7 % — ABNORMAL LOW (ref 36.0–46.0)
Hemoglobin: 8.6 g/dL — ABNORMAL LOW (ref 12.0–15.0)
MCH: 22.6 pg — ABNORMAL LOW (ref 26.0–34.0)
MCHC: 29 g/dL — ABNORMAL LOW (ref 30.0–36.0)
MCV: 78 fL — ABNORMAL LOW (ref 80.0–100.0)
Platelets: 344 10*3/uL (ref 150–400)
RBC: 3.81 MIL/uL — ABNORMAL LOW (ref 3.87–5.11)
RDW: 23.4 % — ABNORMAL HIGH (ref 11.5–15.5)
WBC: 13.1 10*3/uL — ABNORMAL HIGH (ref 4.0–10.5)
nRBC: 0 % (ref 0.0–0.2)

## 2019-05-31 LAB — BASIC METABOLIC PANEL
Anion gap: 15 (ref 5–15)
BUN: 47 mg/dL — ABNORMAL HIGH (ref 6–20)
CO2: 16 mmol/L — ABNORMAL LOW (ref 22–32)
Calcium: 10.2 mg/dL (ref 8.9–10.3)
Chloride: 99 mmol/L (ref 98–111)
Creatinine, Ser: 3.82 mg/dL — ABNORMAL HIGH (ref 0.44–1.00)
GFR calc Af Amer: 16 mL/min — ABNORMAL LOW (ref >60)
GFR calc non Af Amer: 14 mL/min — ABNORMAL LOW (ref >60)
Glucose, Bld: 101 mg/dL — ABNORMAL HIGH (ref 70–99)
Potassium: 4.2 mmol/L (ref 3.5–5.1)
Sodium: 130 mmol/L — ABNORMAL LOW (ref 135–145)

## 2019-05-31 LAB — BPAM RBC
Blood Product Expiration Date: 202008082359
Blood Product Expiration Date: 202008122359
ISSUE DATE / TIME: 202007171008
ISSUE DATE / TIME: 202007171505
Unit Type and Rh: 5100
Unit Type and Rh: 5100

## 2019-05-31 LAB — PROTIME-INR
INR: 2.4 — ABNORMAL HIGH (ref 0.8–1.2)
Prothrombin Time: 25.8 seconds — ABNORMAL HIGH (ref 11.4–15.2)

## 2019-05-31 LAB — HEPARIN LEVEL (UNFRACTIONATED): Heparin Unfractionated: 0.26 IU/mL — ABNORMAL LOW (ref 0.30–0.70)

## 2019-05-31 NOTE — Progress Notes (Signed)
[place] for Heparin Indication: DVT  Allergies  Allergen Reactions  . Aspirin     Due to hx of stomach ulcers    Patient Measurements: Height: 5\' 5"  (165.1 cm) Weight: (!) 528 lb (239.5 kg) IBW/kg (Calculated) : 57 Heparin Dosing Weight: 120 kg  Vital Signs: Temp: 97.8 F (36.6 C) (07/17 2300) Temp Source: Oral (07/17 2300) BP: 105/45 (07/18 0100) Pulse Rate: 88 (07/17 2300)  Labs: Recent Labs    05/29/19 0551  05/30/19 0425 05/30/19 2010 05/31/19 0243  HGB 7.4*  --  6.7* 8.4* 8.6*  HCT 27.2*   < > 22.9* 29.0* 29.7*  PLT 388  --  340  --  344  LABPROT 19.2*  --  22.7*  --  25.8*  INR 1.6*  --  2.0*  --  2.4*  HEPARINUNFRC 0.59  --  0.31  --  0.26*  CREATININE 4.78*  --  4.67*  --  3.82*   < > = values in this interval not displayed.    Estimated Creatinine Clearance: 38.6 mL/min (A) (by C-G formula based on SCr of 3.82 mg/dL (H)).  Assessment: 43 yo female with extensive RLE DVT for heparin  Goal of Therapy:  Heparin level 0.3-0.7 Monitor platelets by anticoagulation protocol: Yes   Plan:  Increase Heparin 2800 units/hr  Phillis Knack, PharmD, BCPS

## 2019-05-31 NOTE — Progress Notes (Signed)
PROGRESS NOTE    Megan Ortiz  DEY:[id number]  DOB: 03-29-1976  DOA: 05/24/2019 PCP: Larene Beach, MD  Brief Narrative:  43 year old female with morbid obesity, asthma, hypertension, hiatal hernia, bleeding gastric ulcer over 10 years ago while living in [place], history of PE approximately 10 years ago per patient was on anticoagulation that has subsequently been discontinued, history of necrotizing fasciitis who presented to the ED with complaints of 2 days of right leg pain. Work-up consistent with extensive right lower extremity DVT from right common femoral vein down to the calf veins. Patient noted to have a leukocytosis of 18.3, hemoglobin of 7.2. Patient was sent for CT angiogram at [facility] however due to her size unable to fit into the [place] subsequently sent to [facility]. CT angiogram chest which was done was negative for PE however did show a large hiatal hernia with dependent density within the hiatal hernia suggestive of active hemorrhage. Patient noted to have a prior history of IVC filter placement. Patient initially placed on heparin however due to concerns for GI bleed heparin discontinued. Patient noted to have a hemoglobin drop as low as 6.5 and being transfused 2 units of packed red blood cells. Patient also placed empirically on IV antibiotics due to concern for pneumonia noted on chest x-ray on presentation to the ED. Patient placed on octreotide drip, started on Protonix drip and GI consulted for further evaluation and management.   Hospital course complicated by acute metabolic encephalopathy with obtundation, severe hypotension requiring several boluses of IV fluid and concentrated IV albumin. CT head, chest, abdomen and pelvis essentially unrevealing.  Ammonia level negative.  No clear source of infection with elevated white count up to 15 point 3K.  Started on IV antibiotics empirically on 7/16. Consultants:  Gastroenterology: Dr. Ardis Hughs  05/25/2019  Nephrology  Procedures:  CT angiogram chest 05/24/2019  2 units packed red blood cell transfusion 05/24/2019>> 05/25/2019  Right lower extremity Doppler 05/24/2019  Chest x-ray 05/24/2019, 05/25/2019  Renal ultrasound 05/24/2019  Upper endoscopy 05/25/2019 per Dr. Ardis Hughs  Antimicrobials:   IV cefepime 05/24/2019>>>>>05/27/2019  IV vancomycin 05/24/2019>>>>> 05/25/2019  IV zosyn 05/29/19>>   Subjective:  Patient awake and alert today.  Communicating well.  She is complaining of right lower extremity swelling and tightness.  Remains on heparin drip.  Blood pressure improved to systolic 314H today.  Unreliable blood pressure readings given morbid obesity  Objective: Vitals:   05/31/19 0100 05/31/19 0353 05/31/19 1222 05/31/19 1713  BP: (!) 105/45 (!) 106/41 (!) 100/49 (!) 101/43  Pulse:  90 (!) 104 98  Resp: 15 15 16 16   Temp:  97.8 F (36.6 C) 97.7 F (36.5 C) 97.9 F (36.6 C)  TempSrc:  Oral Oral Oral  SpO2: 95% 96% 95% 98%  Weight:  (!) 247.7 kg    Height:        Intake/Output Summary (Last 24 hours) at 05/31/2019 1958 Last data filed at 05/31/2019 7026 Gross per 24 hour  Intake 3471.16 ml  Output 2825 ml  Net 646.16 ml   Filed Weights   05/30/19 0002 05/30/19 0500 05/31/19 0353  Weight: (!) 239.5 kg (!) 239.5 kg (!) 247.7 kg    Physical Examination:  General exam: Morbidly obese female, appears awake and alert  Respiratory system: Distant breath sounds, clear to auscultation. Respiratory effort normal. Cardiovascular system: S1 & S2 heard, RRR. No JVD, murmurs, rubs, gallops or clicks. No pedal edema. Gastrointestinal system: Abdomen is obese, nondistended, soft and nontender. No organomegaly  or masses felt. Normal bowel sounds heard. Central nervous system: Alert and oriented. No focal neurological deficits. Extremities: Acute on chronic leg edema right greater than left Skin: No rashes, lesions or ulcers Psychiatry: Judgement and insight appear  normal. Mood & affect appropriate.     Data Reviewed: I have personally reviewed following labs and imaging studies  CBC: Recent Labs  Lab 05/25/19 0718  05/26/19 0046 05/27/19 0412  05/28/19 0343 05/28/19 2300 05/29/19 0551 05/29/19 1030 05/30/19 0425 05/30/19 2010 05/31/19 0243  WBC 11.9*  --  14.7* 13.2*  --  13.5* 15.0* 15.3*  --  12.8*  --  13.1*  NEUTROABS 9.0*  --  11.4* 10.7*  --  10.8*  --  10.7*  --   --   --   --   HGB 7.1*   < > 8.1* 7.0*   < > 7.4* 7.1* 7.4*  --  6.7* 8.4* 8.6*  HCT 25.9*   < > 29.2* 25.4*   < > 26.8* 26.2* 27.2* 22.8* 22.9* 29.0* 29.7*  MCV 73.8*  --  76.0* 76.5*  --  76.6* 78.0* 78.2*  --  76.6*  --  78.0*  PLT 295  --  325 344  --  369 341 388  --  340  --  344   < > = values in this interval not displayed.   Basic Metabolic Panel: Recent Labs  Lab 05/25/19 0718 05/26/19 0046 05/27/19 0412 05/28/19 0343 05/28/19 2300 05/29/19 0551 05/30/19 0425 05/31/19 0243  NA 133* 133* 134* 133* 131* 132* 131* 130*  K 4.4 4.5 4.3 4.3 4.2 4.2 4.4 4.2  CL 102 104 99 99 98 97* 99 99  CO2 19* 16* 24 22 20* 21* 19* 16*  GLUCOSE 155* 150* 131* 110* 150* 106* 93 101*  BUN 26* 28* 35* 41* 43* 45* 46* 47*  CREATININE 2.07* 1.93* 2.75* 3.84* 4.25* 4.78* 4.67* 3.82*  CALCIUM 10.1 9.8 9.9 11.0* 10.9* 11.0* 10.3 10.2  MG 1.7 2.3 2.3  --   --   --   --   --   PHOS  --   --   --   --   --   --  6.7*  --    GFR: Estimated Creatinine Clearance: 39.5 mL/min (A) (by C-G formula based on SCr of 3.82 mg/dL (H)). Liver Function Tests: Recent Labs  Lab 05/25/19 0718 05/26/19 0046 05/27/19 0412 05/28/19 0343 05/29/19 0551 05/30/19 0425  AST 14* 12* 13* 14* 11*  --   ALT 11 13 13 13 13   --   ALKPHOS 67 68 58 66 65  --   BILITOT 1.0 0.8 0.7 0.9 0.4  --   PROT 7.4 7.7 6.7 7.6 7.2  --   ALBUMIN 3.2* 3.2* 2.7* 2.8* 2.6* 3.8   Recent Labs  Lab 05/29/19 1920  LIPASE 34   Recent Labs  Lab 05/29/19 0551  AMMONIA 19   Coagulation Profile: Recent Labs   Lab 05/27/19 0852 05/28/19 0343 05/29/19 0551 05/30/19 0425 05/31/19 0243  INR 1.2 1.1 1.6* 2.0* 2.4*   Cardiac Enzymes: No results for input(s): CKTOTAL, CKMB, CKMBINDEX, TROPONINI in the last 168 hours. BNP (last 3 results) No results for input(s): PROBNP in the last 8760 hours. HbA1C: No results for input(s): HGBA1C in the last 72 hours. CBG: Recent Labs  Lab 05/29/19 2026 05/30/19 0007 05/30/19 0437 05/30/19 1150 05/30/19 1647  GLUCAP 60* 82 72 77 87   Lipid Profile: No results for input(s): CHOL,  HDL, LDLCALC, TRIG, CHOLHDL, LDLDIRECT in the last 72 hours. Thyroid Function Tests: Recent Labs    05/30/19 1823  TSH 0.194*   Anemia Panel: Recent Labs    05/29/19 1030 05/30/19 1823  VITAMINB12 910 1,249*  FERRITIN 127  --   TIBC 307  --   IRON 91  --   RETICCTPCT 2.5  --    Sepsis Labs: Recent Labs  Lab 05/29/19 0551 05/29/19 0825  PROCALCITON 0.46  --   LATICACIDVEN 0.9 1.0    Recent Results (from the past 240 hour(s))  Blood Culture (routine x 2)     Status: None   Collection Time: 05/24/19  8:35 AM   Specimen: Left Antecubital; Blood  Result Value Ref Range Status   Specimen Description LEFT ANTECUBITAL Blood Culture adequate volume  Final   Special Requests BOTTLES DRAWN AEROBIC AND ANAEROBIC  Final   Culture   Final    NO GROWTH 5 DAYS Performed at [facility], [address], [place], [place] [address]    Report Status 05/29/2019 FINAL  Final  SARS Coronavirus 2 (CEPHEID - Performed in [place] hospital lab), Hosp Order     Status: None   Collection Time: 05/24/19  8:35 AM   Specimen: Nasopharyngeal Swab  Result Value Ref Range Status   SARS Coronavirus 2 NEGATIVE NEGATIVE Final    Comment: (NOTE) If result is NEGATIVE SARS-CoV-2 target nucleic acids are NOT DETECTED. The SARS-CoV-2 RNA is generally detectable in upper and lower  respiratory specimens during the acute phase of infection. The lowest  concentration of SARS-CoV-2 viral  copies this assay can detect is 250  copies / mL. A negative result does not preclude SARS-CoV-2 infection  and should not be used as the sole basis for treatment or other  patient management decisions.  A negative result may occur with  improper specimen collection / handling, submission of specimen other  than nasopharyngeal swab, presence of viral mutation(s) within the  areas targeted by this assay, and inadequate number of viral copies  (<250 copies / mL). A negative result must be combined with clinical  observations, patient history, and epidemiological information. If result is POSITIVE SARS-CoV-2 target nucleic acids are DETECTED. The SARS-CoV-2 RNA is generally detectable in upper and lower  respiratory specimens dur ing the acute phase of infection.  Positive  results are indicative of active infection with SARS-CoV-2.  Clinical  correlation with patient history and other diagnostic information is  necessary to determine patient infection status.  Positive results do  not rule out bacterial infection or co-infection with other viruses. If result is PRESUMPTIVE POSTIVE SARS-CoV-2 nucleic acids MAY BE PRESENT.   A presumptive positive result was obtained on the submitted specimen  and confirmed on repeat testing.  While 2019 novel coronavirus  (SARS-CoV-2) nucleic acids may be present in the submitted sample  additional confirmatory testing may be necessary for epidemiological  and / or clinical management purposes  to differentiate between  SARS-CoV-2 and other Sarbecovirus currently known to infect humans.  If clinically indicated additional testing with an alternate test  methodology [phone]) is advised. The SARS-CoV-2 RNA is generally  detectable in upper and lower respiratory sp ecimens during the acute  phase of infection. The expected result is Negative. Fact Sheet for Patients:  StrictlyIdeas.no Fact Sheet for Healthcare Providers:  BankingDealers.co.za This test is not yet approved or cleared by the [place] FDA and has been authorized for detection and/or diagnosis of SARS-CoV-2 by FDA under  an Emergency Use Authorization (EUA).  This EUA will remain in effect (meaning this test can be used) for the duration of the COVID-19 declaration under Section 564(b)(1) of the Act, 21 U.S.C. section 360bbb-3(b)(1), unless the authorization is terminated or revoked sooner. Performed at [facility], [address], [place], [place] [address]   Blood Culture (routine x 2)     Status: None   Collection Time: 05/24/19  8:36 AM   Specimen: BLOOD LEFT HAND  Result Value Ref Range Status   Specimen Description BLOOD LEFT HAND Blood Culture adequate volume  Final   Special Requests BOTTLES DRAWN AEROBIC AND ANAEROBIC  Final   Culture   Final    NO GROWTH 5 DAYS Performed at [facility], [address], [place], [place] [address]    Report Status 05/29/2019 FINAL  Final  Urine Culture     Status: None   Collection Time: 05/25/19  3:50 AM   Specimen: Urine, Catheterized  Result Value Ref Range Status   Specimen Description URINE, CATHETERIZED  Final   Special Requests NONE  Final   Culture   Final    NO GROWTH Performed at [place] [address] [place], [place] [address]    Report Status 05/26/2019 FINAL  Final  Urine Culture     Status: None   Collection Time: 05/27/19  8:12 AM   Specimen: Urine, Random  Result Value Ref Range Status   Specimen Description URINE, RANDOM  Final   Special Requests NONE  Final   Culture   Final    NO GROWTH Performed at [facility], [place] [address], [place], [place] [address]    Report Status 05/28/2019 FINAL  Final  MRSA PCR Screening     Status: None   Collection Time: 05/29/19  6:46 PM   Specimen: Nasal Mucosa; Nasopharyngeal  Result Value Ref Range Status   MRSA by PCR NEGATIVE NEGATIVE Final    Comment:        The GeneXpert MRSA  Assay (FDA approved for NASAL specimens only), is one component of a comprehensive MRSA colonization surveillance program. It is not intended to diagnose MRSA infection nor to guide or monitor treatment for MRSA infections. Performed at [facility], [place] [address], [place], [place] [address]   Culture, blood (routine x 2)     Status: None (Preliminary result)   Collection Time: 05/29/19  7:15 PM   Specimen: BLOOD  Result Value Ref Range Status   Specimen Description BLOOD RIGHT ANTECUBITAL  Final   Special Requests   Final    BOTTLES DRAWN AEROBIC AND ANAEROBIC Blood Culture adequate volume   Culture   Final    NO GROWTH 2 DAYS Performed at [facility], [place] [address], [place], [place] [address]    Report Status PENDING  Incomplete  Culture, blood (routine x 2)     Status: None (Preliminary result)   Collection Time: 05/29/19  7:20 PM   Specimen: BLOOD RIGHT WRIST  Result Value Ref Range Status   Specimen Description BLOOD RIGHT WRIST  Final   Special Requests   Final    BOTTLES DRAWN AEROBIC ONLY Blood Culture results may not be optimal due to an inadequate volume of blood received in culture bottles   Culture   Final    NO GROWTH 2 DAYS Performed at [facility], [place] [address] [place], [place] [address]    Report Status PENDING  Incomplete  Radiology Studies: Ct Head Wo Contrast  Result Date: 05/30/2019 CLINICAL DATA:  Encephalopathy EXAM: CT HEAD WITHOUT CONTRAST TECHNIQUE: Contiguous axial images were obtained from the base of the skull through the vertex without intravenous contrast. COMPARISON:  CT head 05/29/2019 FINDINGS: Brain: Extensive streak artifact from the patient's upper extremities resultant beam hardening and may obscure detection of subtle abnormalities. No evidence of acute infarction, hemorrhage, hydrocephalus, extra-axial collection or mass lesion/mass effect. Patchy areas of white matter hypoattenuation are most compatible with  chronic microvascular angiopathy. Vascular: Atherosclerotic calcification of the carotid siphons. No hyperdense vessel. Skull: Normal. Negative for fracture or focal lesion. Sinuses/Orbits: Nonspecific mineralization of the bilateral retinal with sparing of the optic discs. Mild mural thickening in the right maxillary sinus. Mostly edentulous. Other: Initial acquisition includes and much of the left shoulder soft tissues. Features of calcific tendinopathy with mild glenohumeral and acromioclavicular arthrosis. IMPRESSION: Extensive streak artifact from the patient's upper extremities with resultant beam hardening and may limit detection of subtle abnormalities. No acute intracranial abnormality. Age advanced white matter microvascular changes. Nonspecific bilateral retinal mineralization. Correlate with ocular history. Left shoulder arthropathy and calcific tendinosis. Electronically Signed   By: Lovena Le M.D.   On: 05/30/2019 18:58        Scheduled Meds: . sodium chloride   Intravenous Once  . docusate sodium  100 mg Oral BID  . ferrous sulfate  325 mg Oral BID WC  . montelukast  10 mg Oral Daily  . pantoprazole  40 mg Oral BID AC  . sodium chloride flush  10-40 mL Intracatheter Q12H  . sodium chloride flush  3 mL Intravenous Q12H  . topiramate  50 mg Oral Daily   Continuous Infusions: . sodium chloride    . sodium chloride 100 mL/hr at 05/29/19 2243  . dextrose 100 mL/hr at 05/31/19 1822  . heparin 2,800 Units/hr (05/31/19 1820)  . piperacillin-tazobactam (ZOSYN)  IV 3.375 g (05/31/19 1133)    Assessment & Plan:    1.  Hypotension/AKI: Appreciate renal evaluation.  Likely multifactorial with fluctuating blood pressures, anemia.  Improving with IV hydration.  Renal ultrasonogram showed discrepant size of kidneys, likely chronic and no indication for renal artery stenosis work-up per nephrology.  Watch for worsening leg swellings on IV hydration.  2.  Acute metabolic encephalopathy:  Seen by neurology.  CT head negative.  EEG was abnormal showing mostly triphasic waves indicating a toxic-metabolic effect.    Now resolved with improved blood pressure/anemia.  3.  Acute on chronic anemia: Status post 2 units of PRBC on admission and 2 units on July 17.  Patient seen by GI and underwent upper endoscopy on 05/25/2019 which showed a large hiatal hernia filled with solid and liquid food, no old or recent blood noted, gastric contents freely reflux to mid esophagus.octreotide drip was discontinued yesterday evening. Protonix drip discontinued and patient now on oral PPI twice daily.  Patient also receiving IV iron through renal.  Continue to monitor on heparin drip.  4.  Extensive right lower extremity DVT: Venous Doppler on July 11 showed diffuse acute deep venous thrombosis extending from the right common femoral vein through the calf veins.  Patient has chronic venous stasis/leg edema/lymphedema.  Now worse on the right side.  Coumadin on hold in anticipation of renal intervention.  5.  Acute hypoxic respiratory failure: Present on admission requiring 3 L O2.  Likely related to obesity hypoventilation syndrome chest atelectasis.  ABG did show hypoxia but no hypercapnia.   CT  chest negative for PE or infiltrate.  Currently saturating well on room air.  Continue incentive spirometry.  6. Leukocytosis: Unclear etiology.  Treated for questionable left lower lobe pneumonia on presentation but CT chest did not show any evidence of PE or infiltrate.  COVID-19 negative.?  Related to phlebitis.  Patient started on Zosyn.  7. Generalized anxiety/depression: Continue Zoloft  8. GERD: Continue Protonix p.o. 40 mg twice daily\  9.  Severe morbid obesity: BMI of 89.  Will need definitive management/bariatric clinic referral through PCP upon discharge  DVT prophylaxis: Heparin Code Status: Full code Family / Patient Communication: Discussed with patient.  Husband is next of kin Disposition  Plan: TBD     LOS: 7 days    Time spent: 35 minutes    Guilford Shi, MD [facility] Pager [phone]  If 7PM-7AM, please contact night-coverage [email or web address] Password [facility] 05/31/2019, 7:58 PM

## 2019-05-31 NOTE — Progress Notes (Signed)
[facility]    NEPHROLOGY PROGRESS NOTE  SUBJECTIVE:   Patient seen and examined.  More interactive today.  Answers questions and is aware of where she is.  Review of systems grossly negative.  OBJECTIVE:  Vitals:   05/31/19 0100 05/31/19 0353  BP: (!) 105/45 (!) 106/41  Pulse:  90  Resp: 15 15  Temp:  97.8 F (36.6 C)  SpO2: 95% 96%    Intake/Output Summary (Last 24 hours) at 05/31/2019 1132 Last data filed at 05/31/2019 0840 Gross per 24 hour  Intake 3316.16 ml  Output 1700 ml  Net 1616.16 ml      General:  AAOx3 NAD HEENT: MMM [place] AT anicteric sclera Neck:  No JVD, no adenopathy CV:  Heart RRR  Lungs:  L/S CTA bilaterally Abd:  distended with normal BS, obese GU:  Bladder non-palpable Extremities:  (+)3 bilateral LE edema. Skin:  No skin rash  MEDICATIONS:  . sodium chloride   Intravenous Once  . docusate sodium  100 mg Oral BID  . ferrous sulfate  325 mg Oral BID WC  . montelukast  10 mg Oral Daily  . pantoprazole  40 mg Oral BID AC  . sodium chloride flush  10-40 mL Intracatheter Q12H  . sodium chloride flush  3 mL Intravenous Q12H  . topiramate  50 mg Oral Daily       LABS:   CBC Latest Ref Rng & Units 05/31/2019 05/30/2019 05/30/2019  WBC 4.0 - 10.5 K/uL 13.1(H) - 12.8(H)  Hemoglobin 12.0 - 15.0 g/dL 8.6(L) 8.4(L) 6.7(LL)  Hematocrit 36.0 - 46.0 % 29.7(L) 29.0(L) 22.9(L)  Platelets 150 - 400 K/uL 344 - 340    CMP Latest Ref Rng & Units 05/31/2019 05/30/2019 05/29/2019  Glucose 70 - 99 mg/dL 101(H) 93 106(H)  BUN 6 - 20 mg/dL 47(H) 46(H) 45(H)  Creatinine 0.44 - 1.00 mg/dL 3.82(H) 4.67(H) 4.78(H)  Sodium 135 - 145 mmol/L 130(L) 131(L) 132(L)  Potassium 3.5 - 5.1 mmol/L 4.2 4.4 4.2  Chloride 98 - 111 mmol/L 99 99 97(L)  CO2 22 - 32 mmol/L 16(L) 19(L) 21(L)  Calcium 8.9 - 10.3 mg/dL 10.2 10.3 11.0(H)  Total Protein 6.5 - 8.1 g/dL - - 7.2  Total Bilirubin 0.3 - 1.2 mg/dL - - 0.4  Alkaline Phos 38 - 126 U/L - - 65  AST 15 - 41 U/L - -  11(L)  ALT 0 - 44 U/L - - 13    Lab Results  Component Value Date   CALCIUM 10.2 05/31/2019   PHOS 6.7 (H) 05/30/2019       Component Value Date/Time   COLORURINE AMBER (A) 05/27/2019 1300   APPEARANCEUR CLOUDY (A) 05/27/2019 1300   LABSPEC 1.032 (H) 05/27/2019 1300   PHURINE 5.0 05/27/2019 1300   GLUCOSEU NEGATIVE 05/27/2019 1300   HGBUR NEGATIVE 05/27/2019 1300   BILIRUBINUR NEGATIVE 05/27/2019 1300   KETONESUR 5 (A) 05/27/2019 1300   PROTEINUR NEGATIVE 05/27/2019 1300   UROBILINOGEN 0.2 05/11/2013 1903   NITRITE NEGATIVE 05/27/2019 1300   LEUKOCYTESUR NEGATIVE 05/27/2019 1300      Component Value Date/Time   PHART 7.371 05/30/2019 1655   PCO2ART 33.4 05/30/2019 1655   PO2ART 79.4 (L) 05/30/2019 1655   HCO3 18.9 (L) 05/30/2019 1655   TCO2 21 02/19/2017 0856   ACIDBASEDEF 5.4 (H) 05/30/2019 1655   O2SAT 96.2 05/30/2019 1655       Component Value Date/Time   IRON 91 05/29/2019 1030   TIBC 307 05/29/2019 1030  FERRITIN 127 05/29/2019 1030   IRONPCTSAT 30 05/29/2019 1030       ASSESSMENT/PLAN:    **AKI: Likely multifactorial AKI with contributions of volume depletion +modest hypotension + contrast administration + anemia. UA repeat shows no blood or protein, Up/C 0.1.  Her renal US did show discrepant size of kidneys (~13 and ~10cm) but I don't this this is causing an acute issue -- likely it's congenital or at least a chronic issue; no indication for RAS workup at this time).  UOP and creat improving.    Suspect phosphorus will decrease as renal function improves.  **AMS:   Improvement in mental status noted.  Neurology following.  **Metabolic acidosis: improved, now off bicarb gtt.    **DVT: RLE. On heparin gtt bridging to coumadin.  In light of worsening renal function and possible need for catheter placement will hold coumadin; d/w hospitalist. Has old IVC filter noted on imaging.   **anemia, microcytic: s/p pRBC. Has chronic anemia in setting of heavy  menstrual cycles. Receiving IV iron feraheme here as well.  No indication for ESA given this is AKI.    **morbid obesity: BMI 89.     [place], DO, [place]

## 2019-05-31 NOTE — Progress Notes (Addendum)
Brief History 43yr old lady who is morbidly obese with multiple comorbidities. She was admitted for DVT, PE. On coumadin. Had bleeding problems, h/h dropped and she was transfused. Renal function also significantly deteriorated with Crt starting at 1.35 to 4.67. She developed progressive AMS. EEG was abnormal showing mostly triphasic waves indicating a toxic-metabolic effect.    Subjective Today she is A&Ox4, playing on her iPad when I enter room. She did c/o nausea and had small amt of vomit noted on her.   Past Medical History Past Medical History:  Diagnosis Date  . Anemia   . Anxiety   . Asthma   . Gastric ulcer   . Headache   . Hiatal hernia   . Hypertension   . PE (pulmonary embolism)   . Tachycardia     Past Surgical History Past Surgical History:  Procedure Laterality Date  . CESAREAN SECTION    . CHOLECYSTECTOMY    . ESOPHAGOGASTRODUODENOSCOPY (EGD) WITH PROPOFOL N/A 05/25/2019   Procedure: ESOPHAGOGASTRODUODENOSCOPY (EGD) WITH PROPOFOL;  Surgeon: [name], MD;  Location: [facility] ENDOSCOPY;  Service: Endoscopy;  Laterality: N/A;  . INCISION AND DRAINAGE ABSCESS Left 02/16/2017   Procedure: DEBRIDEMENT LEFT BUTTOCK ABSCESS;  Surgeon: Fanny Skates, MD;  Location: [place];  Service: General;  Laterality: Left;  . IRRIGATION AND DEBRIDEMENT BUTTOCKS Left 02/19/2017   Procedure: DEBRIDEMENT OF GLUTEAL WOUND;  Surgeon: Rolm Bookbinder, MD;  Location: [place];  Service: General;  Laterality: Left;    Allergies Allergies  Allergen Reactions  . Aspirin     Due to hx of stomach ulcers    Home Medications Medications Prior to Admission  Medication Sig Dispense Refill  . acetaminophen (TYLENOL) 500 MG tablet Take 500 mg by mouth every 6 (six) hours as needed for moderate pain.    Marland Kitchen albuterol (VENTOLIN HFA) 108 (90 BASE) MCG/ACT inhaler Inhale 2 puffs into the lungs every 6 (six) hours as needed for wheezing.    . diltiazem (TIAZAC) 180 MG 24 hr capsule Take 180 mg by mouth  daily.    Marland Kitchen esomeprazole (NEXIUM) 20 MG capsule Take 20 mg by mouth daily at 12 noon.    Marland Kitchen lisinopril (ZESTRIL) 20 MG tablet Take 20 mg by mouth daily.    . montelukast (SINGULAIR) 10 MG tablet Take 10 mg by mouth daily.    . nortriptyline (PAMELOR) 25 MG capsule Take 25 mg by mouth at bedtime.    . ondansetron (ZOFRAN) 4 MG tablet Take 1 tablet (4 mg total) by mouth every 8 (eight) hours as needed for nausea or vomiting. 20 tablet 0  . sertraline (ZOLOFT) 100 MG tablet Take 1 tablet (100 mg total) by mouth 2 (two) times daily. 60 tablet 2  . topiramate (TOPAMAX) 50 MG tablet Take 50 mg by mouth daily.  [facility] Medications . sodium chloride   Intravenous Once  . docusate sodium  100 mg Oral BID  . ferrous sulfate  325 mg Oral BID WC  . montelukast  10 mg Oral Daily  . pantoprazole  40 mg Oral BID AC  . sodium chloride flush  10-40 mL Intracatheter Q12H  . sodium chloride flush  3 mL Intravenous Q12H  . topiramate  50 mg Oral Daily     Objective  Intake/Output from previous day: 07/17 0701 - 07/18 0700 In: 3316.2 [I.V.:3029.2; Blood:287] Out: 1700 [Urine:1700] Intake/Output this shift: Total I/O In: 13 [I.V.:13] Out: -  Nutritional status:  Diet Order  Diet Heart Room service appropriate? Yes; Fluid consistency: Thin  Diet effective now               Physical Exam -  Vitals:   05/30/19 2300 05/31/19 0000 05/31/19 0100 05/31/19 0353  BP: (!) 129/48 (!) 119/36 (!) 105/45 (!) 106/41  Pulse: 88   90  Resp: 15 18 15 15   Temp: 97.8 F (36.6 C)   97.8 F (36.6 C)  TempSrc: Oral   Oral  SpO2: 93% 92% 95% 96%  Weight:    (!) 247.7 kg  Height:       General - morbidly obese, disheveled appearance.  Heart - Regular rate and rhythm - no murmer Lungs - unable to fully auscultation due size Abdomen - Soft - non tender Extremities - Distal pulses intact - no edema Skin - Warm and dry  Neurologic Exam:   Mental Status:  Alert, oriented, thought  content appropriate, she is somewhat slow with responses and speech. Speech without evidence of dysarthria or aphasia. Able to follow 3 step commands without difficulty.  Cranial Nerves:  II-bilateral visual fields intact III/IV/VI-Pupils were equal and reacted. Extraocular movements were full.  V/VII-no facial numbness and no facial weakness.  VIII-hearing normal.  X-normal speech and symmetrical palatal movement.  XII-midline tongue extension  Motor: Freely moving both arms over head, grips are wnl. unable to lift her very large legs off the bed or hold against gravity. She does make attempt, and says that is "as good as they get". Foot push is 4/5 bilat.  Sensory: Intact to light touch in all extremities. Deep Tendon Reflexes: unable to obtain d/t size Plantars: Downgoing bilaterally  Cerebellar: Normal finger to nose and heel to shin bilaterally. Gait: not tested; she tells me she doesn't really walk at baseline   LABORATORY RESULTS:  Basic Metabolic Panel: Recent Labs  Lab 05/25/19 0718 05/26/19 0046 05/27/19 0412 05/28/19 0343 05/28/19 2300 05/29/19 0551 05/30/19 0425 05/31/19 0243  NA 133* 133* 134* 133* 131* 132* 131* 130*  K 4.4 4.5 4.3 4.3 4.2 4.2 4.4 4.2  CL 102 104 99 99 98 97* 99 99  CO2 19* 16* 24 22 20* 21* 19* 16*  GLUCOSE 155* 150* 131* 110* 150* 106* 93 101*  BUN 26* 28* 35* 41* 43* 45* 46* 47*  CREATININE 2.07* 1.93* 2.75* 3.84* 4.25* 4.78* 4.67* 3.82*  CALCIUM 10.1 9.8 9.9 11.0* 10.9* 11.0* 10.3 10.2  MG 1.7 2.3 2.3  --   --   --   --   --   PHOS  --   --   --   --   --   --  6.7*  --     Liver Function Tests: Recent Labs  Lab 05/25/19 0718 05/26/19 0046 05/27/19 0412 05/28/19 0343 05/29/19 0551 05/30/19 0425  AST 14* 12* 13* 14* 11*  --   ALT 11 13 13 13 13   --   ALKPHOS 67 68 58 66 65  --   BILITOT 1.0 0.8 0.7 0.9 0.4  --   PROT 7.4 7.7 6.7 7.6 7.2  --   ALBUMIN 3.2* 3.2* 2.7* 2.8* 2.6* 3.8   Recent Labs  Lab 05/29/19 1920  LIPASE 34    Recent Labs  Lab 05/29/19 0551  AMMONIA 19    CBC: Recent Labs  Lab 05/25/19 0718  05/26/19 0046 05/27/19 0412  05/28/19 0343 05/28/19 2300 05/29/19 0551 05/29/19 1030 05/30/19 0425 05/30/19 2010 05/31/19 0243  WBC 11.9*  --  14.7* 13.2*  --  13.5* 15.0* 15.3*  --  12.8*  --  13.1*  NEUTROABS 9.0*  --  11.4* 10.7*  --  10.8*  --  10.7*  --   --   --   --   HGB 7.1*   < > 8.1* 7.0*   < > 7.4* 7.1* 7.4*  --  6.7* 8.4* 8.6*  HCT 25.9*   < > 29.2* 25.4*   < > 26.8* 26.2* 27.2* 22.8* 22.9* 29.0* 29.7*  MCV 73.8*  --  76.0* 76.5*  --  76.6* 78.0* 78.2*  --  76.6*  --  78.0*  PLT 295  --  325 344  --  369 341 388  --  340  --  344   < > = values in this interval not displayed.    Cardiac Enzymes: No results for input(s): CKTOTAL, CKMB, CKMBINDEX, TROPONINI in the last 168 hours.  Lipid Panel: No results for input(s): CHOL, TRIG, HDL, CHOLHDL, VLDL, LDLCALC in the last 168 hours.  CBG: Recent Labs  Lab 05/29/19 2026 05/30/19 0007 05/30/19 0437 05/30/19 1150 05/30/19 1647  GLUCAP 60* 82 72 77 87    Microbiology:   Coagulation Studies: Recent Labs    05/29/19 0551 05/30/19 0425 05/31/19 0243  LABPROT 19.2* 22.7* 25.8*  INR 1.6* 2.0* 2.4*    Miscellaneous Labs:   IMAGING RESULTS Ct Abdomen Pelvis Wo Contrast  Result Date: 05/29/2019 CLINICAL DATA:  Encephalopathy. EXAM: CT CHEST, ABDOMEN AND PELVIS WITHOUT CONTRAST TECHNIQUE: Multidetector CT imaging of the chest, abdomen and pelvis was performed following the standard protocol without IV contrast. COMPARISON:  CTA chest 05/24/2019 FINDINGS: CT CHEST FINDINGS Cardiovascular: The heart size is normal. No substantial pericardial effusion. No thoracic aortic aneurysm. Mediastinum/Nodes: No mediastinal lymphadenopathy. No evidence for gross hilar lymphadenopathy although assessment is limited by the lack of intravenous contrast on today's study. The esophagus has normal imaging features. There is no axillary  lymphadenopathy. Large hiatal hernia. Lungs/Pleura: No suspicious pulmonary nodule or mass. No focal airspace consolidation. Minimal atelectasis noted in the posterior bases, left greater than right. No pleural effusion. Musculoskeletal: No worrisome lytic or sclerotic osseous abnormality. CT ABDOMEN PELVIS FINDINGS Hepatobiliary: No focal abnormality in the liver on this study without intravenous contrast. Gallbladder is surgically absent. No intrahepatic or extrahepatic biliary dilation. Pancreas: Pancreas is poorly visualized and a degree of pancreatic and peripancreatic edema cannot be excluded. Spleen: No splenomegaly. No focal mass lesion. Adrenals/Urinary Tract: No adrenal nodule or mass. No gross renal mass or hydronephrosis. No definite hydroureter. And time a pelvis obscured by artifact from large body habitus. Stomach/Bowel: Large hiatal hernia. No small bowel dilatation to suggest obstruction. No colonic dilatation. Small bowel loops and colonic segments of the pelvis are obscured. Vascular/Lymphatic: No abdominal aortic aneurysm. IVC filter noted in situ. No bulky abdominal lymphadenopathy. Pelvic sidewalls are obscured. Reproductive: Obscured. Other: No gross free fluid in the abdomen.  Pelvis is obscured. Musculoskeletal: No worrisome lytic or sclerotic osseous abnormality. IMPRESSION: 1. Markedly limited study due to technical factors related to the patient's large body habitus. 2. Pancreas is not well seen but intra pancreatic and peripancreatic is suggested although this may be artifactual. As such correlation for pancreatitis recommended. 3. Large hiatal hernia. Electronically Signed   By: Misty Stanley M.D.   On: 05/29/2019 18:42   Ct Head Wo Contrast  Result Date: 05/30/2019 CLINICAL DATA:  Encephalopathy EXAM: CT HEAD WITHOUT CONTRAST TECHNIQUE: Contiguous axial images were obtained from the base  of the skull through the vertex without intravenous contrast. COMPARISON:  CT head 05/29/2019  FINDINGS: Brain: Extensive streak artifact from the patient's upper extremities resultant beam hardening and may obscure detection of subtle abnormalities. No evidence of acute infarction, hemorrhage, hydrocephalus, extra-axial collection or mass lesion/mass effect. Patchy areas of white matter hypoattenuation are most compatible with chronic microvascular angiopathy. Vascular: Atherosclerotic calcification of the carotid siphons. No hyperdense vessel. Skull: Normal. Negative for fracture or focal lesion. Sinuses/Orbits: Nonspecific mineralization of the bilateral retinal with sparing of the optic discs. Mild mural thickening in the right maxillary sinus. Mostly edentulous. Other: Initial acquisition includes and much of the left shoulder soft tissues. Features of calcific tendinopathy with mild glenohumeral and acromioclavicular arthrosis. IMPRESSION: Extensive streak artifact from the patient's upper extremities with resultant beam hardening and may limit detection of subtle abnormalities. No acute intracranial abnormality. Age advanced white matter microvascular changes. Nonspecific bilateral retinal mineralization. Correlate with ocular history. Left shoulder arthropathy and calcific tendinosis. Electronically Signed   By: Lovena Le M.D.   On: 05/30/2019 18:58   Ct Head Wo Contrast  Result Date: 05/29/2019 CLINICAL DATA:  Encephalopathy.  Morbid obesity EXAM: CT HEAD WITHOUT CONTRAST TECHNIQUE: Contiguous axial images were obtained from the base of the skull through the vertex without intravenous contrast. COMPARISON:  None. FINDINGS: Brain: Examination is markedly limited due to motion. The patient was not able to fit into the scanner with arms down therefore the arms above the head causing significant artifact. Ventricle size normal. No hemorrhage mass or midline shift. Early ischemic infarction or brain edema would be difficult to identify on the study. IMPRESSION: Markedly limited study.  No acute  abnormality. Electronically Signed   By: Franchot Gallo M.D.   On: 05/29/2019 18:54   Ct Chest Wo Contrast  Result Date: 05/29/2019 CLINICAL DATA:  Encephalopathy. EXAM: CT CHEST, ABDOMEN AND PELVIS WITHOUT CONTRAST TECHNIQUE: Multidetector CT imaging of the chest, abdomen and pelvis was performed following the standard protocol without IV contrast. COMPARISON:  CTA chest 05/24/2019 FINDINGS: CT CHEST FINDINGS Cardiovascular: The heart size is normal. No substantial pericardial effusion. No thoracic aortic aneurysm. Mediastinum/Nodes: No mediastinal lymphadenopathy. No evidence for gross hilar lymphadenopathy although assessment is limited by the lack of intravenous contrast on today's study. The esophagus has normal imaging features. There is no axillary lymphadenopathy. Large hiatal hernia. Lungs/Pleura: No suspicious pulmonary nodule or mass. No focal airspace consolidation. Minimal atelectasis noted in the posterior bases, left greater than right. No pleural effusion. Musculoskeletal: No worrisome lytic or sclerotic osseous abnormality. CT ABDOMEN PELVIS FINDINGS Hepatobiliary: No focal abnormality in the liver on this study without intravenous contrast. Gallbladder is surgically absent. No intrahepatic or extrahepatic biliary dilation. Pancreas: Pancreas is poorly visualized and a degree of pancreatic and peripancreatic edema cannot be excluded. Spleen: No splenomegaly. No focal mass lesion. Adrenals/Urinary Tract: No adrenal nodule or mass. No gross renal mass or hydronephrosis. No definite hydroureter. And time a pelvis obscured by artifact from large body habitus. Stomach/Bowel: Large hiatal hernia. No small bowel dilatation to suggest obstruction. No colonic dilatation. Small bowel loops and colonic segments of the pelvis are obscured. Vascular/Lymphatic: No abdominal aortic aneurysm. IVC filter noted in situ. No bulky abdominal lymphadenopathy. Pelvic sidewalls are obscured. Reproductive: Obscured.  Other: No gross free fluid in the abdomen.  Pelvis is obscured. Musculoskeletal: No worrisome lytic or sclerotic osseous abnormality. IMPRESSION: 1. Markedly limited study due to technical factors related to the patient's large body habitus. 2. Pancreas is not well  seen but intra pancreatic and peripancreatic is suggested although this may be artifactual. As such correlation for pancreatitis recommended. 3. Large hiatal hernia. Electronically Signed   By: Misty Stanley M.D.   On: 05/29/2019 18:42     EEG: This is an abnormal electroencephalogram due to a disorganized, slow background with frequent triphasic wave activity.  This is most consistent with an encephalopathy.  Although most often seen with hepatic encephalopathies, it is not isolated to this clinical scenario.  Assessment/Plan: 43yr old lady who is morbidly obese with multiple comorbidities. She was admitted for DVT, PE. On coumadin. Had bleeding problems, h/h dropped and she was transfused. Renal function also significantly deteriorated with Crt starting at 1.35 to 4.67. She developed progressive AMS. EEG was abnormal showing mostly triphasic waves indicating a toxic-metabolic effect.  # Toxic-metabolic encephalopathy- EEG is c/w this. She is now clearing and mentation seems to be near baseline. She is A&Ox4, but still a bit slow w/responses. She doesn't fit in CT scanner or MRI machine to have completed further wk up. At this time, since she has improved so much with max medical support, will not seek further neuroimaging.   RECS: Con't current support We will s/o. Please call back if needed.   Desiree Metzger-Cihelka, ARNP-C, ANVP-BC Pager: [phone]   Attending Neurohospitalist Addendum Patient seen and examined with APP/Resident. Agree with the history and physical as documented above. Agree with the plan as documented, which I helped formulate. I have independently reviewed the chart, obtained history, review of systems and  examined the patient.I have personally reviewed pertinent head/neck/spine imaging (CT/MRI).  Extremely motion riddled nearly nondiagnostic quality of CT scans but no large bleed identified within the constraints of the artifactual image  Discussed my plan with the patient's nephrologist on the unit Neurology services will be available as needed. Please feel free to call with any questions. --- Amie Portland, MD [facility] Pager: [phone]  If 7pm to 7am, please call on call as listed on AMION.

## 2019-06-01 LAB — CBC
HCT: 29.8 % — ABNORMAL LOW (ref 36.0–46.0)
Hemoglobin: 8.7 g/dL — ABNORMAL LOW (ref 12.0–15.0)
MCH: 23 pg — ABNORMAL LOW (ref 26.0–34.0)
MCHC: 29.2 g/dL — ABNORMAL LOW (ref 30.0–36.0)
MCV: 78.8 fL — ABNORMAL LOW (ref 80.0–100.0)
Platelets: 366 10*3/uL (ref 150–400)
RBC: 3.78 MIL/uL — ABNORMAL LOW (ref 3.87–5.11)
RDW: 24.7 % — ABNORMAL HIGH (ref 11.5–15.5)
WBC: 12.1 10*3/uL — ABNORMAL HIGH (ref 4.0–10.5)
nRBC: 0 % (ref 0.0–0.2)

## 2019-06-01 LAB — BASIC METABOLIC PANEL
Anion gap: 14 (ref 5–15)
BUN: 38 mg/dL — ABNORMAL HIGH (ref 6–20)
CO2: 17 mmol/L — ABNORMAL LOW (ref 22–32)
Calcium: 10.2 mg/dL (ref 8.9–10.3)
Chloride: 101 mmol/L (ref 98–111)
Creatinine, Ser: 2.54 mg/dL — ABNORMAL HIGH (ref 0.44–1.00)
GFR calc Af Amer: 26 mL/min — ABNORMAL LOW (ref >60)
GFR calc non Af Amer: 22 mL/min — ABNORMAL LOW (ref >60)
Glucose, Bld: 120 mg/dL — ABNORMAL HIGH (ref 70–99)
Potassium: 3.7 mmol/L (ref 3.5–5.1)
Sodium: 132 mmol/L — ABNORMAL LOW (ref 135–145)

## 2019-06-01 LAB — PROTIME-INR
INR: 2.2 — ABNORMAL HIGH (ref 0.8–1.2)
Prothrombin Time: 24.1 seconds — ABNORMAL HIGH (ref 11.4–15.2)

## 2019-06-01 LAB — HEPARIN LEVEL (UNFRACTIONATED): Heparin Unfractionated: 0.4 IU/mL (ref 0.30–0.70)

## 2019-06-01 MED ORDER — POLYVINYL ALCOHOL 1.4 % OP SOLN
1.0000 [drp] | OPHTHALMIC | Status: DC | PRN
  Administered 2019-06-02: 1 [drp] via OPHTHALMIC
  Filled 2019-06-01: qty 15

## 2019-06-01 MED ORDER — SODIUM BICARBONATE 650 MG PO TABS
650.0000 mg | ORAL_TABLET | Freq: Two times a day (BID) | ORAL | Status: DC
  Administered 2019-06-01 – 2019-06-03 (×5): 650 mg via ORAL
  Filled 2019-06-01 (×5): qty 1

## 2019-06-01 MED ORDER — AMOXICILLIN-POT CLAVULANATE 875-125 MG PO TABS
1.0000 | ORAL_TABLET | Freq: Two times a day (BID) | ORAL | Status: DC
  Administered 2019-06-01 – 2019-06-03 (×5): 1 via ORAL
  Filled 2019-06-01 (×5): qty 1

## 2019-06-01 NOTE — Progress Notes (Signed)
Pharmacy Antibiotic Note  Megan Ortiz is a 43 y.o. female admitted on 05/24/2019 with extensive RLE DVT and hypotension. Concerns for PNA on initial chest x-ray. CT negative for infiltrate. WBC stable. Afebrile. Pharmacy has been consulted for Zosyn dosing.   Plan: Zosyn 3.375g IV q8h (4 hour infusion).  Length of Therapy? Possibly de-escalate to Ceftriaxone?    Height: 5\' 5"  (165.1 cm) Weight: (!) 554 lb (251.3 kg) IBW/kg (Calculated) : 57  Temp (24hrs), Avg:97.9 F (36.6 C), Min:97.7 F (36.5 C), Max:98.2 F (36.8 C)  Recent Labs  Lab 05/28/19 0343 05/28/19 2300 05/29/19 0551 05/29/19 0825 05/30/19 0425 05/31/19 0243 06/01/19 0850  WBC 13.5* 15.0* 15.3*  --  12.8* 13.1* 12.1*  CREATININE 3.84* 4.25* 4.78*  --  4.67* 3.82*  --   LATICACIDVEN  --   --  0.9 1.0  --   --   --     Estimated Creatinine Clearance: 40 mL/min (A) (by C-G formula based on SCr of 3.82 mg/dL (H)).    Allergies  Allergen Reactions  . Aspirin     Due to hx of stomach ulcers    Antimicrobials this admission: 7/11 cefepime >>7/14  7/11 vancomycin >> 7/12 7/16 Zosyn >>  Microbiology results: 7/11 BCx: Negative  7/11 Covid 19: negative  7/12 UCx: negative 7/14 UCx: negative 7/16 BCx: negative 7/16 MRSA PCR: neg  Thank you for allowing pharmacy to be a part of this patient's care.  Lorel Monaco, PharmD PGY1 Ambulatory Care Resident [employer] # [phone]

## 2019-06-01 NOTE — Progress Notes (Signed)
[facility]    NEPHROLOGY PROGRESS NOTE  SUBJECTIVE:   Patient seen and examined.  More interactive today.  Is able to hold a logical conversation.  Denies any headaches, fevers, chills, chest pain, shortness of breath, nausea, vomiting, diarrhea or dysuria.  All other review of systems are negative.  OBJECTIVE:  Vitals:   06/01/19 0030 06/01/19 0406  BP: (!) 94/48 (!) 103/45  Pulse: (!) 103 88  Resp: (!) 29 14  Temp: 98.1 F (36.7 C) 98.2 F (36.8 C)  SpO2: 97% 95%    Intake/Output Summary (Last 24 hours) at 06/01/2019 1138 Last data filed at 06/01/2019 1100 Gross per 24 hour  Intake 2103 ml  Output 3750 ml  Net -1647 ml      General:  AAOx3 NAD HEENT: [employer] AT anicteric sclera Neck:  No JVD, no adenopathy CV:  Heart RRR  Lungs:  L/S CTA bilaterally Abd:  distended with normal BS, obese GU:  Bladder non-palpable Extremities:  (+)3 bilateral LE edema. Skin:  No skin rash  MEDICATIONS:  . sodium chloride   Intravenous Once  . docusate sodium  100 mg Oral BID  . ferrous sulfate  325 mg Oral BID WC  . montelukast  10 mg Oral Daily  . pantoprazole  40 mg Oral BID AC  . sodium chloride flush  10-40 mL Intracatheter Q12H  . sodium chloride flush  3 mL Intravenous Q12H  . topiramate  50 mg Oral Daily       LABS:   CBC Latest Ref Rng & Units 06/01/2019 05/31/2019 05/30/2019  WBC 4.0 - 10.5 K/uL 12.1(H) 13.1(H) -  Hemoglobin 12.0 - 15.0 g/dL 8.7(L) 8.6(L) 8.4(L)  Hematocrit 36.0 - 46.0 % 29.8(L) 29.7(L) 29.0(L)  Platelets 150 - 400 K/uL 366 344 -    CMP Latest Ref Rng & Units 06/01/2019 05/31/2019 05/30/2019  Glucose 70 - 99 mg/dL 120(H) 101(H) 93  BUN 6 - 20 mg/dL 38(H) 47(H) 46(H)  Creatinine 0.44 - 1.00 mg/dL 2.54(H) 3.82(H) 4.67(H)  Sodium 135 - 145 mmol/L 132(L) 130(L) 131(L)  Potassium 3.5 - 5.1 mmol/L 3.7 4.2 4.4  Chloride 98 - 111 mmol/L 101 99 99  CO2 22 - 32 mmol/L 17(L) 16(L) 19(L)  Calcium 8.9 - 10.3 mg/dL 10.2 10.2 10.3  Total Protein  6.5 - 8.1 g/dL - - -  Total Bilirubin 0.3 - 1.2 mg/dL - - -  Alkaline Phos 38 - 126 U/L - - -  AST 15 - 41 U/L - - -  ALT 0 - 44 U/L - - -    Lab Results  Component Value Date   CALCIUM 10.2 06/01/2019   PHOS 6.7 (H) 05/30/2019       Component Value Date/Time   COLORURINE AMBER (A) 05/27/2019 1300   APPEARANCEUR CLOUDY (A) 05/27/2019 1300   LABSPEC 1.032 (H) 05/27/2019 1300   PHURINE 5.0 05/27/2019 1300   GLUCOSEU NEGATIVE 05/27/2019 1300   HGBUR NEGATIVE 05/27/2019 1300   BILIRUBINUR NEGATIVE 05/27/2019 1300   KETONESUR 5 (A) 05/27/2019 1300   PROTEINUR NEGATIVE 05/27/2019 1300   UROBILINOGEN 0.2 05/11/2013 1903   NITRITE NEGATIVE 05/27/2019 1300   LEUKOCYTESUR NEGATIVE 05/27/2019 1300      Component Value Date/Time   PHART 7.371 05/30/2019 1655   PCO2ART 33.4 05/30/2019 1655   PO2ART 79.4 (L) 05/30/2019 1655   HCO3 18.9 (L) 05/30/2019 1655   TCO2 21 02/19/2017 0856   ACIDBASEDEF 5.4 (H) 05/30/2019 1655   O2SAT 96.2 05/30/2019 1655  Component Value Date/Time   IRON 91 05/29/2019 1030   TIBC 307 05/29/2019 1030   FERRITIN 127 05/29/2019 1030   IRONPCTSAT 30 05/29/2019 1030       ASSESSMENT/PLAN:    **AKI: Likely multifactorial AKI with contributions of volume depletion +modest hypotension + contrast administration + anemia. UA repeat shows no blood or protein, Up/C 0.1.  Her renal US did show discrepant size of kidneys (~13 and ~10cm) but I don't this this is causing an acute issue -- likely it's congenital or at least a chronic issue; no indication for RAS workup at this time).  UOP and creat improving.    Suspect phosphorus will decrease as renal function improves.  No indication for dialysis.  Okay to remove Foley.  **AMS:   Improvement in mental status noted.  Neurology following.  **Metabolic acidosis: We will add sodium bicarbonate tablets.  **DVT: RLE. On heparin gtt bridging to coumadin.  In light of worsening renal function and possible need for  catheter placement will hold coumadin; d/w hospitalist. Has old IVC filter noted on imaging.   **anemia, microcytic: s/p pRBC. Has chronic anemia in setting of heavy menstrual cycles. Receiving IV iron feraheme here as well.  No indication for ESA given this is AKI.    **morbid obesity: BMI 89.     [place], DO, [place]

## 2019-06-01 NOTE — Progress Notes (Signed)
PROGRESS NOTE    Megan Ortiz  TYO:[id number]  DOB: 09-07-1976  DOA: 05/24/2019 PCP: Larene Beach, MD  Brief Narrative:  43 year old female with morbid obesity, asthma, hypertension, hiatal hernia, bleeding gastric ulcer over 10 years ago while living in [place], history of PE approximately 10 years ago per patient was on anticoagulation that has subsequently been discontinued, history of necrotizing fasciitis who presented to the ED with complaints of 2 days of right leg pain. Work-up consistent with extensive right lower extremity DVT from right common femoral vein down to the calf veins. Patient noted to have a leukocytosis of 18.3, hemoglobin of 7.2. Patient was sent for CT angiogram at [facility] however due to her size unable to fit into the [place] subsequently sent to [facility]. CT angiogram chest which was done was negative for PE however did show a large hiatal hernia with dependent density within the hiatal hernia suggestive of active hemorrhage. Patient noted to have a prior history of IVC filter placement. Patient initially placed on heparin however due to concerns for GI bleed heparin discontinued. Patient noted to have a hemoglobin drop as low as 6.5 and being transfused 2 units of packed red blood cells. Patient also placed empirically on IV antibiotics due to concern for pneumonia noted on chest x-ray on presentation to the ED. Patient placed on octreotide drip, started on Protonix drip and GI consulted for further evaluation and management.   Hospital course complicated by acute metabolic encephalopathy with obtundation, severe hypotension requiring several boluses of IV fluid and concentrated IV albumin. CT head, chest, abdomen and pelvis essentially unrevealing.  Ammonia level negative.  No clear source of infection with elevated white count up to 15 point 3K.  Started on IV antibiotics empirically on 7/16. Consultants:  Gastroenterology: Dr. Ardis Hughs  05/25/2019  Nephrology  Procedures:  CT angiogram chest 05/24/2019  2 units packed red blood cell transfusion 05/24/2019>> 05/25/2019  Right lower extremity Doppler 05/24/2019  Chest x-ray 05/24/2019, 05/25/2019  Renal ultrasound 05/24/2019  Upper endoscopy 05/25/2019 per Dr. Ardis Hughs  Antimicrobials:   IV cefepime 05/24/2019>>>>>05/27/2019  IV vancomycin 05/24/2019>>>>> 05/25/2019  IV zosyn 05/29/19>>   Subjective:  Patient awake and alert now.  Remains on heparin drip. She states she tends to have heavy periods and asking if she could medications to stop her next menstrual cycle. Blood pressure improved to systolic 741S now.  Unreliable blood pressure readings given morbid obesity  Objective: Vitals:   05/31/19 1713 05/31/19 2009 06/01/19 0030 06/01/19 0406  BP: (!) 101/43 (!) 106/52 (!) 94/48 (!) 103/45  Pulse: 98 97 (!) 103 88  Resp: 16 18 (!) 29 14  Temp: 97.9 F (36.6 C) 97.7 F (36.5 C) 98.1 F (36.7 C) 98.2 F (36.8 C)  TempSrc: Oral Oral Oral Oral  SpO2: 98% 96% 97% 95%  Weight:    (!) 251.3 kg  Height:        Intake/Output Summary (Last 24 hours) at 06/01/2019 1140 Last data filed at 06/01/2019 1100 Gross per 24 hour  Intake 2103 ml  Output 3750 ml  Net -1647 ml   Filed Weights   05/30/19 0500 05/31/19 0353 06/01/19 0406  Weight: (!) 239.5 kg (!) 247.7 kg (!) 251.3 kg    Physical Examination:  General exam: Morbidly obese female, appears awake and alert  Respiratory system: Distant breath sounds, clear to auscultation. Respiratory effort normal. Cardiovascular system: S1 & S2 heard, RRR. No JVD, murmurs, rubs, gallops or clicks. No pedal edema. Gastrointestinal system:  Abdomen is obese, nondistended, soft and nontender. No organomegaly or masses felt. Normal bowel sounds heard. Central nervous system: Alert and oriented. No focal neurological deficits. Extremities: Acute on chronic leg edema right greater than left Skin: No rashes, lesions or ulcers  Psychiatry: Judgement and insight appear normal. Mood & affect appropriate.     Data Reviewed: I have personally reviewed following labs and imaging studies  CBC: Recent Labs  Lab 05/26/19 0046 05/27/19 0412  05/28/19 0343 05/28/19 2300 05/29/19 0551 05/29/19 1030 05/30/19 0425 05/30/19 2010 05/31/19 0243 06/01/19 0850  WBC 14.7* 13.2*  --  13.5* 15.0* 15.3*  --  12.8*  --  13.1* 12.1*  NEUTROABS 11.4* 10.7*  --  10.8*  --  10.7*  --   --   --   --   --   HGB 8.1* 7.0*   < > 7.4* 7.1* 7.4*  --  6.7* 8.4* 8.6* 8.7*  HCT 29.2* 25.4*   < > 26.8* 26.2* 27.2* 22.8* 22.9* 29.0* 29.7* 29.8*  MCV 76.0* 76.5*  --  76.6* 78.0* 78.2*  --  76.6*  --  78.0* 78.8*  PLT 325 344  --  369 341 388  --  340  --  344 366   < > = values in this interval not displayed.   Basic Metabolic Panel: Recent Labs  Lab 05/26/19 0046 05/27/19 0412  05/28/19 2300 05/29/19 0551 05/30/19 0425 05/31/19 0243 06/01/19 0850  NA 133* 134*   < > 131* 132* 131* 130* 132*  K 4.5 4.3   < > 4.2 4.2 4.4 4.2 3.7  CL 104 99   < > 98 97* 99 99 101  CO2 16* 24   < > 20* 21* 19* 16* 17*  GLUCOSE 150* 131*   < > 150* 106* 93 101* 120*  BUN 28* 35*   < > 43* 45* 46* 47* 38*  CREATININE 1.93* 2.75*   < > 4.25* 4.78* 4.67* 3.82* 2.54*  CALCIUM 9.8 9.9   < > 10.9* 11.0* 10.3 10.2 10.2  MG 2.3 2.3  --   --   --   --   --   --   PHOS  --   --   --   --   --  6.7*  --   --    < > = values in this interval not displayed.   GFR: Estimated Creatinine Clearance: 60.1 mL/min (A) (by C-G formula based on SCr of 2.54 mg/dL (H)). Liver Function Tests: Recent Labs  Lab 05/26/19 0046 05/27/19 0412 05/28/19 0343 05/29/19 0551 05/30/19 0425  AST 12* 13* 14* 11*  --   ALT 13 13 13 13   --   ALKPHOS 68 58 66 65  --   BILITOT 0.8 0.7 0.9 0.4  --   PROT 7.7 6.7 7.6 7.2  --   ALBUMIN 3.2* 2.7* 2.8* 2.6* 3.8   Recent Labs  Lab 05/29/19 1920  LIPASE 34   Recent Labs  Lab 05/29/19 0551  AMMONIA 19   Coagulation  Profile: Recent Labs  Lab 05/28/19 0343 05/29/19 0551 05/30/19 0425 05/31/19 0243 06/01/19 0850  INR 1.1 1.6* 2.0* 2.4* 2.2*   Cardiac Enzymes: No results for input(s): CKTOTAL, CKMB, CKMBINDEX, TROPONINI in the last 168 hours. BNP (last 3 results) No results for input(s): PROBNP in the last 8760 hours. HbA1C: No results for input(s): HGBA1C in the last 72 hours. CBG: Recent Labs  Lab 05/29/19 2026 05/30/19 0007 05/30/19 0437 05/30/19 1150  05/30/19 1647  GLUCAP 60* 82 72 77 87   Lipid Profile: No results for input(s): CHOL, HDL, LDLCALC, TRIG, CHOLHDL, LDLDIRECT in the last 72 hours. Thyroid Function Tests: Recent Labs    05/30/19 1823  TSH 0.194*   Anemia Panel: Recent Labs    05/30/19 1823  VITAMINB12 1,249*   Sepsis Labs: Recent Labs  Lab 05/29/19 0551 05/29/19 0825  PROCALCITON 0.46  --   LATICACIDVEN 0.9 1.0    Recent Results (from the past 240 hour(s))  Blood Culture (routine x 2)     Status: None   Collection Time: 05/24/19  8:35 AM   Specimen: Left Antecubital; Blood  Result Value Ref Range Status   Specimen Description LEFT ANTECUBITAL Blood Culture adequate volume  Final   Special Requests BOTTLES DRAWN AEROBIC AND ANAEROBIC  Final   Culture   Final    NO GROWTH 5 DAYS Performed at [facility], [address], [place], [place] [address]    Report Status 05/29/2019 FINAL  Final  SARS Coronavirus 2 (CEPHEID - Performed in [place] hospital lab), Hosp Order     Status: None   Collection Time: 05/24/19  8:35 AM   Specimen: Nasopharyngeal Swab  Result Value Ref Range Status   SARS Coronavirus 2 NEGATIVE NEGATIVE Final    Comment: (NOTE) If result is NEGATIVE SARS-CoV-2 target nucleic acids are NOT DETECTED. The SARS-CoV-2 RNA is generally detectable in upper and lower  respiratory specimens during the acute phase of infection. The lowest  concentration of SARS-CoV-2 viral copies this assay can detect is 250  copies / mL. A negative  result does not preclude SARS-CoV-2 infection  and should not be used as the sole basis for treatment or other  patient management decisions.  A negative result may occur with  improper specimen collection / handling, submission of specimen other  than nasopharyngeal swab, presence of viral mutation(s) within the  areas targeted by this assay, and inadequate number of viral copies  (<250 copies / mL). A negative result must be combined with clinical  observations, patient history, and epidemiological information. If result is POSITIVE SARS-CoV-2 target nucleic acids are DETECTED. The SARS-CoV-2 RNA is generally detectable in upper and lower  respiratory specimens dur ing the acute phase of infection.  Positive  results are indicative of active infection with SARS-CoV-2.  Clinical  correlation with patient history and other diagnostic information is  necessary to determine patient infection status.  Positive results do  not rule out bacterial infection or co-infection with other viruses. If result is PRESUMPTIVE POSTIVE SARS-CoV-2 nucleic acids MAY BE PRESENT.   A presumptive positive result was obtained on the submitted specimen  and confirmed on repeat testing.  While 2019 novel coronavirus  (SARS-CoV-2) nucleic acids may be present in the submitted sample  additional confirmatory testing may be necessary for epidemiological  and / or clinical management purposes  to differentiate between  SARS-CoV-2 and other Sarbecovirus currently known to infect humans.  If clinically indicated additional testing with an alternate test  methodology [phone]) is advised. The SARS-CoV-2 RNA is generally  detectable in upper and lower respiratory sp ecimens during the acute  phase of infection. The expected result is Negative. Fact Sheet for Patients:  StrictlyIdeas.no Fact Sheet for Healthcare Providers: BankingDealers.co.za This test is not yet  approved or cleared by the [place] FDA and has been authorized for detection and/or diagnosis of SARS-CoV-2 by FDA under an Emergency Use Authorization (EUA).  This EUA will  remain in effect (meaning this test can be used) for the duration of the COVID-19 declaration under Section 564(b)(1) of the Act, 21 U.S.C. section 360bbb-3(b)(1), unless the authorization is terminated or revoked sooner. Performed at [facility], [address] [place], [place] [address]   Blood Culture (routine x 2)     Status: None   Collection Time: 05/24/19  8:36 AM   Specimen: BLOOD LEFT HAND  Result Value Ref Range Status   Specimen Description BLOOD LEFT HAND Blood Culture adequate volume  Final   Special Requests BOTTLES DRAWN AEROBIC AND ANAEROBIC  Final   Culture   Final    NO GROWTH 5 DAYS Performed at [facility], [address], [place], [place] [address]    Report Status 05/29/2019 FINAL  Final  Urine Culture     Status: None   Collection Time: 05/25/19  3:50 AM   Specimen: Urine, Catheterized  Result Value Ref Range Status   Specimen Description URINE, CATHETERIZED  Final   Special Requests NONE  Final   Culture   Final    NO GROWTH Performed at [facility] [address], [place], [place] [address]    Report Status 05/26/2019 FINAL  Final  Urine Culture     Status: None   Collection Time: 05/27/19  8:12 AM   Specimen: Urine, Random  Result Value Ref Range Status   Specimen Description URINE, RANDOM  Final   Special Requests NONE  Final   Culture   Final    NO GROWTH Performed at [facility], [place] [address], [place], [place] [address]    Report Status 05/28/2019 FINAL  Final  MRSA PCR Screening     Status: None   Collection Time: 05/29/19  6:46 PM   Specimen: Nasal Mucosa; Nasopharyngeal  Result Value Ref Range Status   MRSA by PCR NEGATIVE NEGATIVE Final    Comment:        The GeneXpert MRSA Assay (FDA approved for NASAL specimens only), is one component of  a comprehensive MRSA colonization surveillance program. It is not intended to diagnose MRSA infection nor to guide or monitor treatment for MRSA infections. Performed at [facility], [place] [address], [place], [place] [address]   Culture, blood (routine x 2)     Status: None (Preliminary result)   Collection Time: 05/29/19  7:15 PM   Specimen: BLOOD  Result Value Ref Range Status   Specimen Description BLOOD RIGHT ANTECUBITAL  Final   Special Requests   Final    BOTTLES DRAWN AEROBIC AND ANAEROBIC Blood Culture adequate volume   Culture   Final    NO GROWTH 3 DAYS Performed at [facility], [place] [address], [place], [place] [address]    Report Status PENDING  Incomplete  Culture, blood (routine x 2)     Status: None (Preliminary result)   Collection Time: 05/29/19  7:20 PM   Specimen: BLOOD RIGHT WRIST  Result Value Ref Range Status   Specimen Description BLOOD RIGHT WRIST  Final   Special Requests   Final    BOTTLES DRAWN AEROBIC ONLY Blood Culture results may not be optimal due to an inadequate volume of blood received in culture bottles   Culture   Final    NO GROWTH 3 DAYS Performed at [facility], [place] [address], [place],  [address]    Report Status PENDING  Incomplete      Radiology Studies: Ct Head  Wo Contrast  Result Date: 05/30/2019 CLINICAL DATA:  Encephalopathy EXAM: CT HEAD WITHOUT CONTRAST TECHNIQUE: Contiguous axial images were obtained from the base of the skull through the vertex without intravenous contrast. COMPARISON:  CT head 05/29/2019 FINDINGS: Brain: Extensive streak artifact from the patient's upper extremities resultant beam hardening and may obscure detection of subtle abnormalities. No evidence of acute infarction, hemorrhage, hydrocephalus, extra-axial collection or mass lesion/mass effect. Patchy areas of white matter hypoattenuation are most compatible with chronic microvascular angiopathy. Vascular: Atherosclerotic  calcification of the carotid siphons. No hyperdense vessel. Skull: Normal. Negative for fracture or focal lesion. Sinuses/Orbits: Nonspecific mineralization of the bilateral retinal with sparing of the optic discs. Mild mural thickening in the right maxillary sinus. Mostly edentulous. Other: Initial acquisition includes and much of the left shoulder soft tissues. Features of calcific tendinopathy with mild glenohumeral and acromioclavicular arthrosis. IMPRESSION: Extensive streak artifact from the patient's upper extremities with resultant beam hardening and may limit detection of subtle abnormalities. No acute intracranial abnormality. Age advanced white matter microvascular changes. Nonspecific bilateral retinal mineralization. Correlate with ocular history. Left shoulder arthropathy and calcific tendinosis. Electronically Signed   By: Lovena Le M.D.   On: 05/30/2019 18:58        Scheduled Meds: . sodium chloride   Intravenous Once  . docusate sodium  100 mg Oral BID  . ferrous sulfate  325 mg Oral BID WC  . montelukast  10 mg Oral Daily  . pantoprazole  40 mg Oral BID AC  . sodium chloride flush  10-40 mL Intracatheter Q12H  . sodium chloride flush  3 mL Intravenous Q12H  . topiramate  50 mg Oral Daily   Continuous Infusions: . sodium chloride    . sodium chloride 100 mL/hr at 05/29/19 2243  . dextrose 100 mL/hr at 06/01/19 0427  . heparin 2,800 Units/hr (06/01/19 1117)  . piperacillin-tazobactam (ZOSYN)  IV 3.375 g (06/01/19 1119)    Assessment & Plan:    1.  Hypotension/AKI: Improving. Appreciate renal evaluation.  Likely multifactorial with fluctuating blood pressures, anemia.  Improving with IV hydration.  Renal ultrasonogram showed discrepant size of kidneys, likely chronic and no indication for renal artery stenosis work-up per nephrology.  Watch for worsening leg swellings on IV hydration. Will d/w renal if ?okay to resume coumadin as held in anticipation for possible HD  catheter needs  2.  Acute metabolic encephalopathy: Seen by neurology.  CT head negative.  EEG was abnormal showing mostly triphasic waves indicating a toxic-metabolic effect.    Now resolved with improved blood pressure/anemia.  3.  Acute on chronic anemia: Status post 2 units of PRBC on admission and 2 units on July 17. Hgb still unstable.  Patient seen by GI and underwent upper endoscopy on 05/25/2019 which showed a large hiatal hernia filled with solid and liquid food, no old or recent blood noted, gastric contents freely reflux to mid esophagus.octreotide drip was discontinued yesterday evening. Protonix drip discontinued and patient now on oral PPI twice daily.  Patient also receiving IV iron through renal.  Continue to monitor on heparin drip. Patient reports heavy menstrual cycles even when she was not on anticoagulation. Advised outpatient Gyn f/u, not a candidate for hormone therapy currently due to active DVT.  4.  Extensive right lower extremity DVT: Venous Doppler on July 11 showed diffuse acute deep venous thrombosis extending from the right common femoral vein through the calf veins.  Patient has chronic venous stasis/leg edema/lymphedema.  Now worse on the right  side.  Coumadin on hold in anticipation of renal intervention.  5.  Acute hypoxic respiratory failure: Resolved. Present on admission requiring 3 L O2.  Likely related to obesity hypoventilation syndrome chest atelectasis.  ABG did show hypoxia but no hypercapnia.   CT chest negative for PE or infiltrate.  Currently saturating well on room air.  Continue incentive spirometry.  6. Leukocytosis: Unclear etiology. Down trending on labs today. Treated for questionable left lower lobe pneumonia on presentation but CT chest did not show any evidence of PE or infiltrate.  COVID-19 negative.?  Related to phlebitis.  Patient on Zosyn, will transition to augmentin and monitor.Blood cx from 7/16 -ve.  7. Generalized anxiety/depression:  Continue Zoloft  8. GERD: Continue Protonix p.o. 40 mg twice daily\  9.  Severe morbid obesity: BMI of 89.  Will need definitive management/bariatric clinic referral through PCP upon discharge  DVT prophylaxis: Heparin Code Status: Full code Family / Patient Communication: Discussed with patient.  Husband is next of kin Disposition Plan: PT eval, she prefers to go home     LOS: 8 days    Time spent: 35 minutes    Guilford Shi, MD [facility] Pager [phone]  If 7PM-7AM, please contact night-coverage [email or web address] Password [email or web address] 06/01/2019, 11:40 AM

## 2019-06-01 NOTE — Progress Notes (Signed)
[place] for Heparin Indication: DVT  Allergies  Allergen Reactions  . Aspirin     Due to hx of stomach ulcers    Patient Measurements: Height: 5\' 5"  (165.1 cm) Weight: (!) 554 lb (251.3 kg) IBW/kg (Calculated) : 57 Heparin Dosing Weight: 120 kg  Vital Signs: Temp: 98.2 F (36.8 C) (07/19 0406) Temp Source: Oral (07/19 0406) BP: 103/45 (07/19 0406) Pulse Rate: 88 (07/19 0406)  Labs: Recent Labs    05/30/19 0425 05/30/19 2010 05/31/19 0243 06/01/19 0850  HGB 6.7* 8.4* 8.6* 8.7*  HCT 22.9* 29.0* 29.7* 29.8*  PLT 340  --  344 366  LABPROT 22.7*  --  25.8* 24.1*  INR 2.0*  --  2.4* 2.2*  HEPARINUNFRC 0.31  --  0.26* 0.40  CREATININE 4.67*  --  3.82*  --     Estimated Creatinine Clearance: 40 mL/min (A) (by C-G formula based on SCr of 3.82 mg/dL (H)).  Assessment: 43 yo female with extensive RLE DVT for heparin. Warfarin on hold due to worsening renal function and possible need for cath placement for dialysis.  Heparin level therapeutic at 0.40; INR 2.2, but expected to trend down - last dose 7/15. CBC stable - no signs of bleeding.   Goal of Therapy:  Heparin level 0.3-0.7 Monitor platelets by anticoagulation protocol: Yes   Plan:  -Heparin 2800 units/hr -Daily HL and INR -Monitor for s/s of bleeding  Lorel Monaco, PharmD PGY1 Ambulatory Care Resident [employer] # [phone]

## 2019-06-02 LAB — BASIC METABOLIC PANEL
Anion gap: 10 (ref 5–15)
BUN: 31 mg/dL — ABNORMAL HIGH (ref 6–20)
CO2: 20 mmol/L — ABNORMAL LOW (ref 22–32)
Calcium: 10 mg/dL (ref 8.9–10.3)
Chloride: 104 mmol/L (ref 98–111)
Creatinine, Ser: 1.85 mg/dL — ABNORMAL HIGH (ref 0.44–1.00)
GFR calc Af Amer: 38 mL/min — ABNORMAL LOW (ref >60)
GFR calc non Af Amer: 33 mL/min — ABNORMAL LOW (ref >60)
Glucose, Bld: 121 mg/dL — ABNORMAL HIGH (ref 70–99)
Potassium: 3.5 mmol/L (ref 3.5–5.1)
Sodium: 134 mmol/L — ABNORMAL LOW (ref 135–145)

## 2019-06-02 LAB — HEPARIN LEVEL (UNFRACTIONATED): Heparin Unfractionated: 0.35 IU/mL (ref 0.30–0.70)

## 2019-06-02 LAB — CBC
HCT: 27.5 % — ABNORMAL LOW (ref 36.0–46.0)
Hemoglobin: 8.2 g/dL — ABNORMAL LOW (ref 12.0–15.0)
MCH: 23.2 pg — ABNORMAL LOW (ref 26.0–34.0)
MCHC: 29.8 g/dL — ABNORMAL LOW (ref 30.0–36.0)
MCV: 77.7 fL — ABNORMAL LOW (ref 80.0–100.0)
Platelets: 338 10*3/uL (ref 150–400)
RBC: 3.54 MIL/uL — ABNORMAL LOW (ref 3.87–5.11)
RDW: 24.8 % — ABNORMAL HIGH (ref 11.5–15.5)
WBC: 12.4 10*3/uL — ABNORMAL HIGH (ref 4.0–10.5)
nRBC: 0 % (ref 0.0–0.2)

## 2019-06-02 LAB — PROTIME-INR
INR: 2 — ABNORMAL HIGH (ref 0.8–1.2)
Prothrombin Time: 22.6 seconds — ABNORMAL HIGH (ref 11.4–15.2)

## 2019-06-02 MED ORDER — WARFARIN - PHARMACIST DOSING INPATIENT: Freq: Every day | Status: DC

## 2019-06-02 MED ORDER — WARFARIN SODIUM 7.5 MG PO TABS: 7.5000 mg | ORAL_TABLET | Freq: Once | ORAL | Status: DC

## 2019-06-02 MED ORDER — BISMUTH SUBSALICYLATE 262 MG/15ML PO SUSP
30.0000 mL | ORAL | Status: DC | PRN
  Administered 2019-06-02: 30 mL via ORAL
  Filled 2019-06-02: qty 236

## 2019-06-02 MED ORDER — TRAMADOL-ACETAMINOPHEN 37.5-325 MG PO TABS
1.0000 | ORAL_TABLET | Freq: Four times a day (QID) | ORAL | Status: DC | PRN
  Administered 2019-06-02 – 2019-06-03 (×3): 1 via ORAL
  Filled 2019-06-02 (×4): qty 1

## 2019-06-02 NOTE — Progress Notes (Signed)
[facility]    NEPHROLOGY PROGRESS NOTE  SUBJECTIVE:   Patient seen and examined.  Denies any headaches, fevers, chills, chest pain, shortness of breath, nausea, vomiting, diarrhea or dysuria.  Is complaining of some mild left hip pain and menstrual pain.  All other review of systems are negative.  OBJECTIVE:  Vitals:   06/02/19 0700 06/02/19 0900  BP:    Pulse:    Resp: 20 16  Temp:    SpO2:      Intake/Output Summary (Last 24 hours) at 06/02/2019 1204 Last data filed at 06/02/2019 0900 Gross per 24 hour  Intake 3612.92 ml  Output 4225 ml  Net -612.08 ml      General:  AAOx3 NAD HEENT: MMM [place] AT anicteric sclera Neck:  No JVD, no adenopathy CV:  Heart RRR  Lungs:  L/S CTA bilaterally Abd:  distended with normal BS, obese GU:  Bladder non-palpable Extremities:  (+)3 bilateral LE edema. Skin:  No skin rash  MEDICATIONS:  . sodium chloride   Intravenous Once  . amoxicillin-clavulanate  1 tablet Oral Q12H  . docusate sodium  100 mg Oral BID  . ferrous sulfate  325 mg Oral BID WC  . montelukast  10 mg Oral Daily  . pantoprazole  40 mg Oral BID AC  . sodium bicarbonate  650 mg Oral BID  . sodium chloride flush  10-40 mL Intracatheter Q12H  . sodium chloride flush  3 mL Intravenous Q12H  . topiramate  50 mg Oral Daily  . warfarin  7.5 mg Oral ONCE-1800  . Warfarin - Pharmacist Dosing Inpatient   Does not apply q1800       LABS:   CBC Latest Ref Rng & Units 06/02/2019 06/01/2019 05/31/2019  WBC 4.0 - 10.5 K/uL 12.4(H) 12.1(H) 13.1(H)  Hemoglobin 12.0 - 15.0 g/dL 8.2(L) 8.7(L) 8.6(L)  Hematocrit 36.0 - 46.0 % 27.5(L) 29.8(L) 29.7(L)  Platelets 150 - 400 K/uL 338 366 344    CMP Latest Ref Rng & Units 06/02/2019 06/01/2019 05/31/2019  Glucose 70 - 99 mg/dL 121(H) 120(H) 101(H)  BUN 6 - 20 mg/dL 31(H) 38(H) 47(H)  Creatinine 0.44 - 1.00 mg/dL 1.85(H) 2.54(H) 3.82(H)  Sodium 135 - 145 mmol/L 134(L) 132(L) 130(L)  Potassium 3.5 - 5.1 mmol/L 3.5 3.7 4.2   Chloride 98 - 111 mmol/L 104 101 99  CO2 22 - 32 mmol/L 20(L) 17(L) 16(L)  Calcium 8.9 - 10.3 mg/dL 10.0 10.2 10.2  Total Protein 6.5 - 8.1 g/dL - - -  Total Bilirubin 0.3 - 1.2 mg/dL - - -  Alkaline Phos 38 - 126 U/L - - -  AST 15 - 41 U/L - - -  ALT 0 - 44 U/L - - -    Lab Results  Component Value Date   CALCIUM 10.0 06/02/2019   PHOS 6.7 (H) 05/30/2019       Component Value Date/Time   COLORURINE AMBER (A) 05/27/2019 1300   APPEARANCEUR CLOUDY (A) 05/27/2019 1300   LABSPEC 1.032 (H) 05/27/2019 1300   PHURINE 5.0 05/27/2019 1300   GLUCOSEU NEGATIVE 05/27/2019 1300   HGBUR NEGATIVE 05/27/2019 1300   BILIRUBINUR NEGATIVE 05/27/2019 1300   KETONESUR 5 (A) 05/27/2019 1300   PROTEINUR NEGATIVE 05/27/2019 1300   UROBILINOGEN 0.2 05/11/2013 1903   NITRITE NEGATIVE 05/27/2019 1300   LEUKOCYTESUR NEGATIVE 05/27/2019 1300      Component Value Date/Time   PHART 7.371 05/30/2019 1655   PCO2ART 33.4 05/30/2019 1655   PO2ART 79.4 (L) 05/30/2019 1655  HCO3 18.9 (L) 05/30/2019 1655   TCO2 21 02/19/2017 0856   ACIDBASEDEF 5.4 (H) 05/30/2019 1655   O2SAT 96.2 05/30/2019 1655       Component Value Date/Time   IRON 91 05/29/2019 1030   TIBC 307 05/29/2019 1030   FERRITIN 127 05/29/2019 1030   IRONPCTSAT 30 05/29/2019 1030       ASSESSMENT/PLAN:    **AKI: Likely multifactorial AKI with contributions of volume depletion +modest hypotension + contrast administration + anemia. UA repeat shows no blood or protein, Up/C 0.1.  Her renal US did show discrepant size of kidneys (~13 and ~10cm) but I don't this this is causing an acute issue -- likely it's congenital or at least a chronic issue; no indication for RAS workup at this time).  UOP and creat improving.    No indication for further renal intervention.  **AMS:   Improvement in mental status noted.  Neurology following.  **Metabolic acidosis: Continue sodium bicarb tablets for now.  Hopefully can discontinue in the next several  days.    **DVT: RLE. On heparin gtt bridging to coumadin.    **anemia, microcytic: s/p pRBC. Has chronic anemia in setting of heavy menstrual cycles. Receiving IV iron feraheme here as well.  No indication for ESA given this is AKI.    **morbid obesity: BMI 89.   No further renal intervention necessary.  Will sign off.  Please call with ?s.  Thanks  [employer], DO, [place]

## 2019-06-02 NOTE — Progress Notes (Signed)
Physical Therapy Treatment Patient Details Name: Megan Ortiz MRN: [id number] DOB: [birth year] Today's Date: 06/02/2019    History of Present Illness Patient is a pleasant 43 year old female with morbid obesity, asthma, hypertension, hiatal hernia, bleeding gastric ulcer over 10 years ago while living in [place], history of PE approximately 10 years ago per patient was on anticoagulation that has subsequently been discontinued, history of necrotizing fasciitis who presented to the ED with complaints of 2 days of right leg pain.  Work-up consistent with extensive right lower extremity DVT from right common femoral vein down to the calf veins.  Patient noted to have a leukocytosis of 18.3, hemoglobin of 7.2.  Has IVC filter.  Also underwent Esophagogastroduodenoscopy.      PT Comments    Pt requests to attempt to urinate on BSC.  Pt requires min A for balance with transfers and min A for standing balance while performing hygiene. Pt with forward flexed trunk and decreased glute strength noted. Pt continues to require assist for LEs back into bed at end of session.  Pt much more engaged and conversational this session.    Follow Up Recommendations  Home health PT;Supervision/Assistance - 24 hour     Equipment Recommendations  None recommended by PT    Recommendations for Other Services       Precautions / Restrictions Precautions Precautions: Fall Restrictions Weight Bearing Restrictions: No    Mobility  Bed Mobility Overal bed mobility: Needs Assistance       Supine to sit: Modified independent (Device/Increase time) Sit to supine: Mod assist   General bed mobility comments: assist for bilat LEs into bed and for positioning once in supine  Transfers Overall transfer level: Needs assistance Equipment used: None Transfers: Sit to/from [facility] Sit to Stand: Min assist Stand pivot transfers: Min assist       General transfer comment: pt requires min A  and min/mod cuing for safety wiht stand pivot transfer to [facility]  Ambulation/Gait                 Stairs             Wheelchair Mobility    Modified Rankin (Stroke Patients Only)       Balance             Standing balance-Leahy Scale: Fair Standing balance comment: min A for standing balance for hygiene after toileting                            Cognition Arousal/Alertness: Awake/alert Behavior During Therapy: WFL for tasks assessed/performed Overall Cognitive Status: Within Functional Limits for tasks assessed                                 General Comments: pt more engaged this session. more eye contact and able to carry on a conversation      Exercises      General Comments        Pertinent Vitals/Pain Faces Pain Scale: No hurt    Home Living                      Prior Function            PT Goals (current goals can now be found in the care plan section) Progress towards PT goals: Progressing toward goals    Frequency  Min 3X/week      PT Plan Current plan remains appropriate    Co-evaluation              AM-PAC PT "6 Clicks" Mobility   Outcome Measure  Help needed turning from your back to your side while in a flat bed without using bedrails?: A Little Help needed moving from lying on your back to sitting on the side of a flat bed without using bedrails?: A Little Help needed moving to and from a bed to a chair (including a wheelchair)?: A Little Help needed standing up from a chair using your arms (e.g., wheelchair or bedside chair)?: A Little Help needed to walk in hospital room?: A Lot Help needed climbing 3-5 steps with a railing? : A Lot 6 Click Score: 16    End of Session   Activity Tolerance: Patient tolerated treatment well Patient left: in bed;with call bell/phone within reach Nurse Communication: Mobility status PT Visit Diagnosis: Unsteadiness on feet (R26.81);Muscle  weakness (generalized) (M62.81)     Time: 1030-1057 PT Time Calculation (min) (ACUTE ONLY): 27 min  Charges:  $Therapeutic Activity: 23-37 mins                     Isabelle Course, PT, DPT   DONAWERTH,KAREN 06/02/2019, 1:41 PM

## 2019-06-02 NOTE — Progress Notes (Signed)
PROGRESS NOTE    Megan Ortiz  BPZ:[id number]  DOB: [birth year]  DOA: 05/24/2019 PCP: Larene Beach, MD  Brief Narrative:  43 year old female with morbid obesity, asthma, hypertension, hiatal hernia, bleeding gastric ulcer over 10 years ago while living in [place], history of PE approximately 10 years ago per patient was on anticoagulation that has subsequently been discontinued, history of necrotizing fasciitis who presented to the ED with complaints of 2 days of right leg pain. Work-up consistent with extensive right lower extremity DVT from right common femoral vein down to the calf veins. Patient noted to have a leukocytosis of 18.3, hemoglobin of 7.2. Patient was sent for CT angiogram at [facility] however due to her size unable to fit into the [place] subsequently sent to [facility]. CT angiogram chest which was done was negative for PE however did show a large hiatal hernia with dependent density within the hiatal hernia suggestive of active hemorrhage. Patient noted to have a prior history of IVC filter placement. Patient initially placed on heparin however due to concerns for GI bleed heparin discontinued. Patient noted to have a hemoglobin drop as low as 6.5 and being transfused 2 units of packed red blood cells. Patient also placed empirically on IV antibiotics due to concern for pneumonia noted on chest x-ray on presentation to the ED. Patient placed on octreotide drip, started on Protonix drip and GI consulted for further evaluation and management.   Hospital course complicated by acute metabolic encephalopathy with obtundation, severe hypotension requiring several boluses of IV fluid and concentrated IV albumin. CT head, chest, abdomen and pelvis essentially unrevealing.  Ammonia level negative.  No clear source of infection with elevated white count up to 15 point 3K.  Started on IV antibiotics empirically on 7/16. Consultants:  Gastroenterology: Dr. Ardis Hughs  05/25/2019  Nephrology for AKI   Neurology for AMS  Procedures:  CT angiogram chest 05/24/2019  2 units packed red blood cell transfusion 05/24/2019>> 05/25/2019  Right lower extremity Doppler 05/24/2019  Chest x-ray 05/24/2019, 05/25/2019  Renal ultrasound 05/24/2019  Upper endoscopy 05/25/2019 per Dr. Ardis Hughs  Antimicrobials:   IV cefepime 05/24/2019>>>>>05/27/2019  IV vancomycin 05/24/2019>>>>> 05/25/2019  IV zosyn 05/29/19>>7/19  Augmentin PO 7/19>>>   Subjective:  Patient awake and alert now.  Remains on heparin drip.  Blood pressure improved to systolic 242P now although unreliable readings given morbid obesity  Objective: Vitals:   06/01/19 0406 06/01/19 1225 06/01/19 2025 06/02/19 0404  BP: (!) 103/45 117/60 96/82 (!) 125/59  Pulse: 88 92 (!) 107 97  Resp: 14 19 16 18   Temp: 98.2 F (36.8 C) 98.8 F (37.1 C) 97.9 F (36.6 C) 98 F (36.7 C)  TempSrc: Oral Oral Oral Oral  SpO2: 95% 96% 99% 100%  Weight: (!) 251.3 kg     Height:        Intake/Output Summary (Last 24 hours) at 06/02/2019 0859 Last data filed at 06/02/2019 0650 Gross per 24 hour  Intake 3372.92 ml  Output 4775 ml  Net -1402.08 ml   Filed Weights   05/30/19 0500 05/31/19 0353 06/01/19 0406  Weight: (!) 239.5 kg (!) 247.7 kg (!) 251.3 kg    Physical Examination:  General exam: Morbidly obese female, appears awake and alert  Respiratory system: Distant breath sounds, clear to auscultation. Respiratory effort normal. Cardiovascular system: S1 & S2 heard, RRR. No JVD, murmurs, rubs, gallops or clicks. No pedal edema. Gastrointestinal system: Abdomen is obese, nondistended, soft and nontender. No organomegaly or masses felt. Normal  bowel sounds heard. Central nervous system: Alert and oriented. No focal neurological deficits. Extremities: Acute on chronic leg edema right greater than left Skin: No rashes, lesions or ulcers Psychiatry: Judgement and insight appear normal. Mood & affect  appropriate.     Data Reviewed: I have personally reviewed following labs and imaging studies  CBC: Recent Labs  Lab 05/27/19 0412  05/28/19 0343  05/29/19 0551  05/30/19 0425 05/30/19 2010 05/31/19 0243 06/01/19 0850 06/02/19 0238  WBC 13.2*  --  13.5*   < > 15.3*  --  12.8*  --  13.1* 12.1* 12.4*  NEUTROABS 10.7*  --  10.8*  --  10.7*  --   --   --   --   --   --   HGB 7.0*   < > 7.4*   < > 7.4*  --  6.7* 8.4* 8.6* 8.7* 8.2*  HCT 25.4*   < > 26.8*   < > 27.2*   < > 22.9* 29.0* 29.7* 29.8* 27.5*  MCV 76.5*  --  76.6*   < > 78.2*  --  76.6*  --  78.0* 78.8* 77.7*  PLT 344  --  369   < > 388  --  340  --  344 366 338   < > = values in this interval not displayed.   Basic Metabolic Panel: Recent Labs  Lab 05/27/19 0412  05/29/19 0551 05/30/19 0425 05/31/19 0243 06/01/19 0850 06/02/19 0238  NA 134*   < > 132* 131* 130* 132* 134*  K 4.3   < > 4.2 4.4 4.2 3.7 3.5  CL 99   < > 97* 99 99 101 104  CO2 24   < > 21* 19* 16* 17* 20*  GLUCOSE 131*   < > 106* 93 101* 120* 121*  BUN 35*   < > 45* 46* 47* 38* 31*  CREATININE 2.75*   < > 4.78* 4.67* 3.82* 2.54* 1.85*  CALCIUM 9.9   < > 11.0* 10.3 10.2 10.2 10.0  MG 2.3  --   --   --   --   --   --   PHOS  --   --   --  6.7*  --   --   --    < > = values in this interval not displayed.   GFR: Estimated Creatinine Clearance: 82.5 mL/min (A) (by C-G formula based on SCr of 1.85 mg/dL (H)). Liver Function Tests: Recent Labs  Lab 05/27/19 0412 05/28/19 0343 05/29/19 0551 05/30/19 0425  AST 13* 14* 11*  --   ALT 13 13 13   --   ALKPHOS 58 66 65  --   BILITOT 0.7 0.9 0.4  --   PROT 6.7 7.6 7.2  --   ALBUMIN 2.7* 2.8* 2.6* 3.8   Recent Labs  Lab 05/29/19 1920  LIPASE 34   Recent Labs  Lab 05/29/19 0551  AMMONIA 19   Coagulation Profile: Recent Labs  Lab 05/29/19 0551 05/30/19 0425 05/31/19 0243 06/01/19 0850 06/02/19 0238  INR 1.6* 2.0* 2.4* 2.2* 2.0*   Cardiac Enzymes: No results for input(s): CKTOTAL, CKMB,  CKMBINDEX, TROPONINI in the last 168 hours. BNP (last 3 results) No results for input(s): PROBNP in the last 8760 hours. HbA1C: No results for input(s): HGBA1C in the last 72 hours. CBG: Recent Labs  Lab 05/29/19 2026 05/30/19 0007 05/30/19 0437 05/30/19 1150 05/30/19 1647  GLUCAP 60* 82 72 77 87   Lipid Profile: No results for input(s):  CHOL, HDL, LDLCALC, TRIG, CHOLHDL, LDLDIRECT in the last 72 hours. Thyroid Function Tests: Recent Labs    05/30/19 1823  TSH 0.194*   Anemia Panel: Recent Labs    05/30/19 1823  VITAMINB12 1,249*   Sepsis Labs: Recent Labs  Lab 05/29/19 0551 05/29/19 0825  PROCALCITON 0.46  --   LATICACIDVEN 0.9 1.0    Recent Results (from the past 240 hour(s))  Blood Culture (routine x 2)     Status: None   Collection Time: 05/24/19  8:35 AM   Specimen: Left Antecubital; Blood  Result Value Ref Range Status   Specimen Description LEFT ANTECUBITAL Blood Culture adequate volume  Final   Special Requests BOTTLES DRAWN AEROBIC AND ANAEROBIC  Final   Culture   Final    NO GROWTH 5 DAYS Performed at [facility], [address] [place], [place] [address]    Report Status 05/29/2019 FINAL  Final  SARS Coronavirus 2 (CEPHEID - Performed in [place] hospital lab), Hosp Order     Status: None   Collection Time: 05/24/19  8:35 AM   Specimen: Nasopharyngeal Swab  Result Value Ref Range Status   SARS Coronavirus 2 NEGATIVE NEGATIVE Final    Comment: (NOTE) If result is NEGATIVE SARS-CoV-2 target nucleic acids are NOT DETECTED. The SARS-CoV-2 RNA is generally detectable in upper and lower  respiratory specimens during the acute phase of infection. The lowest  concentration of SARS-CoV-2 viral copies this assay can detect is 250  copies / mL. A negative result does not preclude SARS-CoV-2 infection  and should not be used as the sole basis for treatment or other  patient management decisions.  A negative result may occur with  improper specimen  collection / handling, submission of specimen other  than nasopharyngeal swab, presence of viral mutation(s) within the  areas targeted by this assay, and inadequate number of viral copies  (<250 copies / mL). A negative result must be combined with clinical  observations, patient history, and epidemiological information. If result is POSITIVE SARS-CoV-2 target nucleic acids are DETECTED. The SARS-CoV-2 RNA is generally detectable in upper and lower  respiratory specimens dur ing the acute phase of infection.  Positive  results are indicative of active infection with SARS-CoV-2.  Clinical  correlation with patient history and other diagnostic information is  necessary to determine patient infection status.  Positive results do  not rule out bacterial infection or co-infection with other viruses. If result is PRESUMPTIVE POSTIVE SARS-CoV-2 nucleic acids MAY BE PRESENT.   A presumptive positive result was obtained on the submitted specimen  and confirmed on repeat testing.  While 2019 novel coronavirus  (SARS-CoV-2) nucleic acids may be present in the submitted sample  additional confirmatory testing may be necessary for epidemiological  and / or clinical management purposes  to differentiate between  SARS-CoV-2 and other Sarbecovirus currently known to infect humans.  If clinically indicated additional testing with an alternate test  methodology [phone]) is advised. The SARS-CoV-2 RNA is generally  detectable in upper and lower respiratory sp ecimens during the acute  phase of infection. The expected result is Negative. Fact Sheet for Patients:  StrictlyIdeas.no Fact Sheet for Healthcare Providers: BankingDealers.co.za This test is not yet approved or cleared by the [place] FDA and has been authorized for detection and/or diagnosis of SARS-CoV-2 by FDA under an Emergency Use Authorization (EUA).  This EUA will remain in effect  (meaning this test can be used) for the duration of the COVID-19 declaration under  Section 564(b)(1) of the Act, 21 U.S.C. section 360bbb-3(b)(1), unless the authorization is terminated or revoked sooner. Performed at [facility], [address] [place], [place] [address]   Blood Culture (routine x 2)     Status: None   Collection Time: 05/24/19  8:36 AM   Specimen: BLOOD LEFT HAND  Result Value Ref Range Status   Specimen Description BLOOD LEFT HAND Blood Culture adequate volume  Final   Special Requests BOTTLES DRAWN AEROBIC AND ANAEROBIC  Final   Culture   Final    NO GROWTH 5 DAYS Performed at [facility], [address], [place], [place] [address]    Report Status 05/29/2019 FINAL  Final  Urine Culture     Status: None   Collection Time: 05/25/19  3:50 AM   Specimen: Urine, Catheterized  Result Value Ref Range Status   Specimen Description URINE, CATHETERIZED  Final   Special Requests NONE  Final   Culture   Final    NO GROWTH Performed at [facility] [address] [place], [place] [address]    Report Status 05/26/2019 FINAL  Final  Urine Culture     Status: None   Collection Time: 05/27/19  8:12 AM   Specimen: Urine, Random  Result Value Ref Range Status   Specimen Description URINE, RANDOM  Final   Special Requests NONE  Final   Culture   Final    NO GROWTH Performed at [facility], [place] [address] [place], [place] [address]    Report Status 05/28/2019 FINAL  Final  MRSA PCR Screening     Status: None   Collection Time: 05/29/19  6:46 PM   Specimen: Nasal Mucosa; Nasopharyngeal  Result Value Ref Range Status   MRSA by PCR NEGATIVE NEGATIVE Final    Comment:        The GeneXpert MRSA Assay (FDA approved for NASAL specimens only), is one component of a comprehensive MRSA colonization surveillance program. It is not intended to diagnose MRSA infection nor to guide or monitor treatment for MRSA infections. Performed at [facility], [place] [address], [place], [place] [address]   Culture, blood (routine x 2)     Status: None (Preliminary result)   Collection Time: 05/29/19  7:15 PM   Specimen: BLOOD  Result Value Ref Range Status   Specimen Description BLOOD RIGHT ANTECUBITAL  Final   Special Requests   Final    BOTTLES DRAWN AEROBIC AND ANAEROBIC Blood Culture adequate volume   Culture   Final    NO GROWTH 4 DAYS Performed at [facility], [place] [address], [place], [place] [address]    Report Status PENDING  Incomplete  Culture, blood (routine x 2)     Status: None (Preliminary result)   Collection Time: 05/29/19  7:20 PM   Specimen: BLOOD RIGHT WRIST  Result Value Ref Range Status   Specimen Description BLOOD RIGHT WRIST  Final   Special Requests   Final    BOTTLES DRAWN AEROBIC ONLY Blood Culture results may not be optimal due to an inadequate volume of blood received in culture bottles   Culture   Final    NO GROWTH 4 DAYS Performed at [facility], [place] [address] [place], [place] [address]    Report Status PENDING  Incomplete      Radiology Studies: No results found.      Scheduled Meds: . sodium chloride   Intravenous Once  .  amoxicillin-clavulanate  1 tablet Oral Q12H  . docusate sodium  100 mg Oral BID  . ferrous sulfate  325 mg Oral BID WC  . montelukast  10 mg Oral Daily  . pantoprazole  40 mg Oral BID AC  . sodium bicarbonate  650 mg Oral BID  . sodium chloride flush  10-40 mL Intracatheter Q12H  . sodium chloride flush  3 mL Intravenous Q12H  . topiramate  50 mg Oral Daily   Continuous Infusions: . sodium chloride    . sodium chloride Stopped (05/30/19 2000)  . dextrose 100 mL/hr at 06/02/19 0400  . heparin 2,800 Units/hr (06/02/19 0441)    Assessment & Plan:    1.  Hypotension/AKI: Improving. Appreciate renal evaluation.  Likely multifactorial with fluctuating blood pressures, anemia.  Improving with IV hydration.  Renal ultrasonogram showed discrepant size of  kidneys, likely chronic and no indication for renal artery stenosis work-up per nephrology.  Watch for worsening leg swellings on IV hydration. Will resume coumadin as renal function much improved now ( held last week in anticipation for possible HD catheter needs)  2.  Acute metabolic encephalopathy: Seen by neurology.  CT head negative.  EEG was abnormal showing mostly triphasic waves indicating a toxic-metabolic effect.    Now resolved with improved blood pressure/anemia.  3.  Acute on chronic anemia: Status post 2 units of PRBC on admission and 2 units on July 17. Hgb still unstable.  Patient seen by GI and underwent upper endoscopy on 05/25/2019 which showed a large hiatal hernia filled with solid and liquid food, no old or recent blood noted, gastric contents freely reflux to mid esophagus.octreotide drip was discontinued yesterday evening. Protonix drip discontinued and patient now on oral PPI twice daily.  Patient also receiving IV iron through renal.  Continue to monitor on heparin drip, also initiated coumadin today. Patient reports heavy menstrual cycles even when she was not on anticoagulation. Advised outpatient Gyn f/u, not a candidate for hormone therapy currently due to active DVT.  4.  Extensive right lower extremity DVT: Venous Doppler on July 11 showed diffuse acute deep venous thrombosis extending from the right common femoral vein through the calf veins.  Patient has chronic venous stasis/leg edema/lymphedema.  Now worse on the right side.  Coumadin resumed with bridging heparin  5.  Acute hypoxic respiratory failure: Resolved. Present on admission requiring 3 L O2.  Likely related to obesity hypoventilation syndrome chest atelectasis.  ABG did show hypoxia but no hypercapnia.   CT chest negative for PE or infiltrate.  Currently saturating well on room air.  Continue incentive spirometry.  6. Leukocytosis: Down trending on labs now.?  Related to phlebitis.  Treated for questionable  left lower lobe pneumonia on presentation but CT chest did not show any evidence of PE or infiltrate.  COVID-19 negative. Zosyn transitioned to oral Augmentin..Blood cx from 7/16 -ve.  7. Generalized anxiety/depression: Continue Zoloft  8. GERD: Continue Protonix p.o. 40 mg twice daily\  9.  Severe morbid obesity: BMI of 89.  Will need definitive management/bariatric clinic referral through PCP upon discharge  DVT prophylaxis: Heparin Code Status: Full code Family / Patient Communication: Discussed with patient.  Husband is next of kin Disposition Plan: PT eval, she prefers to go home.     LOS: 9 days    Time spent: 35 minutes    Guilford Shi, MD [facility] Pager [phone]  If 7PM-7AM, please contact night-coverage [email or web address] Password [email or web address] 06/02/2019, 8:59 AM

## 2019-06-02 NOTE — Progress Notes (Signed)
[place] for Warfarin with Heparin bridge Indication: DVT  Allergies  Allergen Reactions  . Aspirin     Due to hx of stomach ulcers    Patient Measurements: Height: 5\' 5"  (165.1 cm) Weight: (!) 554 lb (251.3 kg) IBW/kg (Calculated) : 57 Heparin Dosing Weight: 120 kg  Vital Signs: Temp: 98 F (36.7 C) (07/20 0404) Temp Source: Oral (07/20 0404) BP: 125/59 (07/20 0404) Pulse Rate: 97 (07/20 0404)  Labs: Recent Labs    05/31/19 0243 06/01/19 0850 06/02/19 0238  HGB 8.6* 8.7* 8.2*  HCT 29.7* 29.8* 27.5*  PLT 344 366 338  LABPROT 25.8* 24.1* 22.6*  INR 2.4* 2.2* 2.0*  HEPARINUNFRC 0.26* 0.40 0.35  CREATININE 3.82* 2.54* 1.85*    Estimated Creatinine Clearance: 82.5 mL/min (A) (by C-G formula based on SCr of 1.85 mg/dL (H)).  Assessment: 43 yo female with extensive RLE DVT. Warfarin was on hold due to worsening renal function and possible need for cath placement for dialysis. Pharmacy was consulted to resume warfarin with heparin bridge.  INR 2.0 on day 5 since last warfarin 10 mg dose on 7/15. Received 3 doses of warfarin and INR went to 2.4. Heparin level therapeutic at 0.35 on drip rate 2800 units/hr. CBC stable. No bleeding noted.  Goal of Therapy:  Heparin level 0.3-0.7 Monitor platelets by anticoagulation protocol: Yes   Plan:  -Warfarin 7.5 mg once -Heparin 2800 units/hr -Daily HL, INR, CBC -Monitor for s/s of bleeding  Richardine Service, PharmD PGY1 Pharmacy Resident Phone: [phone] 06/02/2019  9:33 AM  Please check [email or web address] for unit-specific pharmacy phone numbers.

## 2019-06-03 LAB — CBC
HCT: 29.9 % — ABNORMAL LOW (ref 36.0–46.0)
Hemoglobin: 8.5 g/dL — ABNORMAL LOW (ref 12.0–15.0)
MCH: 23 pg — ABNORMAL LOW (ref 26.0–34.0)
MCHC: 28.4 g/dL — ABNORMAL LOW (ref 30.0–36.0)
MCV: 81 fL (ref 80.0–100.0)
Platelets: 378 10*3/uL (ref 150–400)
RBC: 3.69 MIL/uL — ABNORMAL LOW (ref 3.87–5.11)
RDW: 25.8 % — ABNORMAL HIGH (ref 11.5–15.5)
WBC: 12.8 10*3/uL — ABNORMAL HIGH (ref 4.0–10.5)
nRBC: 0 % (ref 0.0–0.2)

## 2019-06-03 LAB — PROTIME-INR
INR: 1.8 — ABNORMAL HIGH (ref 0.8–1.2)
Prothrombin Time: 20.6 seconds — ABNORMAL HIGH (ref 11.4–15.2)

## 2019-06-03 LAB — HEPARIN LEVEL (UNFRACTIONATED): Heparin Unfractionated: 0.42 IU/mL (ref 0.30–0.70)

## 2019-06-03 LAB — CULTURE, BLOOD (ROUTINE X 2)
Culture: NO GROWTH
Culture: NO GROWTH
Special Requests: ADEQUATE

## 2019-06-03 MED ORDER — LORATADINE 10 MG PO TABS: 10.0000 mg | ORAL_TABLET | Freq: Every day | ORAL | Status: DC | PRN

## 2019-06-03 MED ORDER — LIP MEDEX EX OINT: 1.0000 "application " | TOPICAL_OINTMENT | CUTANEOUS | Status: DC | PRN

## 2019-06-03 MED ORDER — SALINE SPRAY 0.65 % NA SOLN: 1.0000 | NASAL | Status: DC | PRN

## 2019-06-03 MED ORDER — AMOXICILLIN-POT CLAVULANATE 875-125 MG PO TABS: 1.0000 | ORAL_TABLET | Freq: Two times a day (BID) | ORAL | 0 refills | Status: AC

## 2019-06-03 MED ORDER — HYDROCORTISONE 1 % EX CREA: 1.0000 "application " | TOPICAL_CREAM | Freq: Three times a day (TID) | CUTANEOUS | Status: DC | PRN

## 2019-06-03 MED ORDER — WARFARIN SODIUM 10 MG PO TABS: 10.0000 mg | ORAL_TABLET | Freq: Every day | ORAL | 0 refills | Status: DC

## 2019-06-03 MED ORDER — PANTOPRAZOLE SODIUM 40 MG PO TBEC: 40.0000 mg | DELAYED_RELEASE_TABLET | Freq: Two times a day (BID) | ORAL | 1 refills | Status: AC

## 2019-06-03 MED ORDER — SODIUM BICARBONATE 650 MG PO TABS: 650.0000 mg | ORAL_TABLET | Freq: Two times a day (BID) | ORAL | 0 refills | Status: AC

## 2019-06-03 MED ORDER — SENNOSIDES-DOCUSATE SODIUM 8.6-50 MG PO TABS: 2.0000 | ORAL_TABLET | Freq: Every evening | ORAL | Status: DC | PRN

## 2019-06-03 MED ORDER — ALUM & MAG HYDROXIDE-SIMETH 200-200-20 MG/5ML PO SUSP: 30.0000 mL | ORAL | Status: DC | PRN

## 2019-06-03 MED ORDER — PHENOL 1.4 % MT LIQD: 1.0000 | OROMUCOSAL | Status: DC | PRN

## 2019-06-03 MED ORDER — WARFARIN SODIUM 10 MG PO TABS
10.0000 mg | ORAL_TABLET | ORAL | Status: AC
  Administered 2019-06-03: 10 mg via ORAL
  Filled 2019-06-03: qty 1

## 2019-06-03 MED ORDER — POLYVINYL ALCOHOL 1.4 % OP SOLN: 1.0000 [drp] | OPHTHALMIC | Status: DC | PRN

## 2019-06-03 MED ORDER — HYDROCORTISONE (PERIANAL) 2.5 % EX CREA: 1.0000 "application " | TOPICAL_CREAM | Freq: Four times a day (QID) | CUTANEOUS | Status: DC | PRN

## 2019-06-03 MED ORDER — MUSCLE RUB 10-15 % EX CREA: 1.0000 "application " | TOPICAL_CREAM | CUTANEOUS | Status: DC | PRN

## 2019-06-03 MED ORDER — FERROUS SULFATE 325 (65 FE) MG PO TABS: 325.0000 mg | ORAL_TABLET | Freq: Two times a day (BID) | ORAL | 0 refills | Status: DC

## 2019-06-03 MED ORDER — GUAIFENESIN-DM 100-10 MG/5ML PO SYRP: 5.0000 mL | ORAL_SOLUTION | ORAL | Status: DC | PRN

## 2019-06-03 MED ORDER — POLYETHYLENE GLYCOL 3350 17 G PO PACK: 17.0000 g | PACK | Freq: Every day | ORAL | Status: DC | PRN

## 2019-06-03 MED ORDER — HYDRALAZINE HCL 20 MG/ML IJ SOLN: 10.0000 mg | INTRAMUSCULAR | Status: DC | PRN

## 2019-06-03 NOTE — Plan of Care (Signed)
  Problem: Education: Goal: Knowledge of General Education information will improve Description: Including pain rating scale, medication(s)/side effects and non-pharmacologic comfort measures Outcome: Completed/Met   Problem: Health Behavior/Discharge Planning: Goal: Ability to manage health-related needs will improve Outcome: Completed/Met   Problem: Clinical Measurements: Goal: Ability to maintain clinical measurements within normal limits will improve Outcome: Completed/Met Goal: Will remain free from infection Outcome: Completed/Met Goal: Diagnostic test results will improve Outcome: Completed/Met Goal: Respiratory complications will improve Outcome: Completed/Met Goal: Cardiovascular complication will be avoided Outcome: Completed/Met   Problem: Clinical Measurements: Goal: Ability to maintain clinical measurements within normal limits will improve Outcome: Completed/Met Goal: Will remain free from infection Outcome: Completed/Met Goal: Diagnostic test results will improve Outcome: Completed/Met Goal: Respiratory complications will improve Outcome: Completed/Met Goal: Cardiovascular complication will be avoided Outcome: Completed/Met   Problem: Health Behavior/Discharge Planning: Goal: Ability to manage health-related needs will improve Outcome: Completed/Met   Problem: Education: Goal: Knowledge of General Education information will improve Description: Including pain rating scale, medication(s)/side effects and non-pharmacologic comfort measures Outcome: Completed/Met

## 2019-06-03 NOTE — Progress Notes (Signed)
PT Cancellation Note  Patient Details Name: Megan Ortiz MRN: [id number] DOB: August 03, 1976   Cancelled Treatment:    Reason Eval/Treat Not Completed: Patient declined, no reason specified Pt refuses PT treatment today stating "I am going home today and I need to save my energy".  PT will continue to attempt treatment as appropriate  Leonila Speranza 06/03/2019, 10:59 AM

## 2019-06-03 NOTE — Discharge Summary (Signed)
Physician Discharge Summary  Megan Ortiz ZOX:[id number] DOB: [birth year] DOA: 05/24/2019  PCP: Larene Beach, MD  Admit date: 05/24/2019 Discharge date: 06/03/2019  Admitted From: Home Disposition: Home with home health  Recommendations for Outpatient Follow-up:  1. Follow up with PCP in 1-2 weeks 2. Please obtain BMP/CBC in one week your next doctors visit.  3. Take oral Coumadin 10 mg daily.  INR check in 2 days, adjust dose as necessary. 4. Augmentin orally for 7 days 5. Iron supplements prescribed 6. Sodium bicarb orally twice daily for 6 more days.  Follow-up outpatient with primary care physician, repeat lab work in about 1 week 7. Protonix twice daily  Home Health: Arrangements made by this case manager Discharge Condition: Stable CODE STATUS: [place] Diet recommendation: 2g NA  Brief/Interim Summary:  43 year old with history of morbid obesity, asthma, hiatal hernia, gastric ulcer over 10 years ago, history of PE with history of IVC filter, necrotizing fasciitis came to the ER initially with complaints of right lower extremity pain.  Diagnosed with right lower extremity DVT.  Due to morbid obesity, CTA was somewhat poor but did not show any large filling defect.  Therefore anticoagulation started heparin bridging to Coumadin.  Hospital course also complicated by hypotension, acute kidney injury which improved with fluids.  Renal ultrasound was negative for any acute pathology.  There was also concern of acute metabolic encephalopathy, CT of the head was normal but EEG showed triphasic waves consistent with metabolic encephalopathy.  Patient was seen by neurology.  Hospital course also complicated by pneumonia versus aspiration pneumonia treated with antibiotics.  Cultures are negative.  COVID-19-negative. Rest of the details described below. Stable for discharge today.  Discharge Diagnoses:  Principal Problem:   Leg DVT (deep venous thromboembolism), acute, right (HCC) Active  Problems:   AKI (acute kidney injury) ([place])   Benign essential HTN   Morbid Obesity    Adjustment disorder with mixed anxiety and depressed mood   Leukocytosis   Right leg pain   Suspected Pulmonary embolus   Community acquired pneumonia   Positive D dimer   Left lower lobe pneumonia (HCC)   Hypotension   ARF (acute renal failure) (HCC)   DVT (deep venous thrombosis) (HCC)   Anemia   Morbidly obese (HCC)   Abnormal CT scan, stomach   Encephalopathy  1.  Hypotension/AKI: Resolved. Appreciate renal evaluation.  Likely multifactorial with fluctuating blood pressures, anemia.  Improving with IV hydration.  Renal ultrasonogram showed discrepant size of kidneys, likely chronic and no indication for renal artery stenosis work-up per nephrology.    Currently on Coumadin.  Nephrology recommending outpatient sodium bicarb for at least week, repeat lab work in about 1 week and make further adjustment as necessary.  2.  Acute metabolic encephalopathy: Resolved.  Seen by neurology.  CT head negative.  EEG was abnormal showing mostly triphasic waves indicating a toxic-metabolic effect.  Now resolved with improved blood pressure/anemia.  3.  Acute on chronic anemia: Status post 2 units of PRBC on admission and 2 units on July 17. Hgb still unstable.  Patient seen by GI and underwent upper endoscopy on 05/25/2019 which showed a large hiatal hernia filled with solid and liquid food, no old or recent blood noted, gastric contents freely reflux to mid esophagus.octreotide drip was discontinued yesterday evening. Protonix drip discontinued and patient now on oral PPI twice daily.  No obvious signs of bleeding from GI tract.  Continue Coumadin.. Patient reports heavy menstrual cycles even when she was not on  anticoagulation.  She will need outpatient GYN follow-up.  4.  Extensive right lower extremity DVT: Venous Doppler on July 11 showed diffuse acute deep venous thrombosis extending from the right common  femoral vein through the calf veins.  Patient cleared by GI to be on anticoagulation.  5.  Acute hypoxic respiratory failure: Resolved.   6. Leukocytosis: Down trending on labs now.?  Related to phlebitis.  Treated for questionable left lower lobe pneumonia on presentation but CT chest did not show any evidence of PE or infiltrate.  COVID-19 negative. Zosyn transitioned to oral Augmentin.  Cultures remain negative.  7 more days of oral Augmentin upon discharge.  7. Generalized anxiety/depression: Continue Zoloft  8. GERD: Continue Protonix p.o. 40 mg twice daily  9.  Severe morbid obesity: BMI of 89.  Will need definitive management/bariatric clinic referral through PCP upon discharge  Consultations:  GI  Neurology  Subjective: Feels better.  Really wants to go home today.  Discharge Exam: Vitals:   06/03/19 0700 06/03/19 1000  BP:  117/78  Pulse:    Resp: 19 (!) 23  Temp:    SpO2:  97%   Vitals:   06/03/19 0300 06/03/19 0624 06/03/19 0700 06/03/19 1000  BP:    117/78  Pulse:      Resp: 12 17 19  (!) 23  Temp:      TempSrc:      SpO2:    97%  Weight:      Height:        General: Pt is alert, awake, not in acute distress Cardiovascular: RRR, S1/S2 +, no rubs, no gallops Respiratory: CTA bilaterally, no wheezing, no rhonchi Abdominal: Soft, NT, ND, bowel sounds + Extremities: no edema, no cyanosis Morbidly obese.  Discharge Instructions  Discharge Instructions    Call MD for:  persistant nausea and vomiting   Complete by: As directed    Call MD for:  severe uncontrolled pain   Complete by: As directed    Diet - low sodium heart healthy   Complete by: As directed    Discharge instructions   Complete by: As directed    You were cared for by a hospitalist during your hospital stay. If you have any questions about your discharge medications or the care you received while you were in the hospital after you are discharged, you can call the unit and asked to  speak with the hospitalist on call if the hospitalist that took care of you is not available. Once you are discharged, your primary care physician will handle any further medical issues. Please note that NO REFILLS for any discharge medications will be authorized once you are discharged, as it is imperative that you return to your primary care physician (or establish a relationship with a primary care physician if you do not have one) for your aftercare needs so that they can reassess your need for medications and monitor your lab values.  Please request your Prim.MD to go over all Hospital Tests and Procedure/Radiological results at the follow up, please get all Hospital records sent to your Prim MD by signing hospital release before you go home.  Get CBC, CMP, 2 view Chest X ray checked  by Primary MD during your next visit or SNF MD in 5-7 days ( we routinely change or add medications that can affect your baseline labs and fluid status, therefore we recommend that you get the mentioned basic workup next visit with your PCP, your PCP may decide not to  get them or add new tests based on their clinical decision)  On your next visit with your primary care physician please Get Medicines reviewed and adjusted.  If you experience worsening of your admission symptoms, develop shortness of breath, life threatening emergency, suicidal or homicidal thoughts you must seek medical attention immediately by calling 911 or calling your MD immediately  if symptoms less severe.  You Must read complete instructions/literature along with all the possible adverse reactions/side effects for all the Medicines you take and that have been prescribed to you. Take any new Medicines after you have completely understood and accpet all the possible adverse reactions/side effects.   Do not drive, operate heavy machinery, perform activities at heights, swimming or participation in water activities or provide baby sitting services if  your were admitted for syncope or siezures until you have seen by Primary MD or a Neurologist and advised to do so again.  Do not drive when taking Pain medications.   Increase activity slowly   Complete by: As directed      Allergies as of 06/03/2019      Reactions   Aspirin    Due to hx of stomach ulcers      Medication List    TAKE these medications   acetaminophen 500 MG tablet Commonly known as: TYLENOL Take 500 mg by mouth every 6 (six) hours as needed for moderate pain.   amoxicillin-clavulanate 875-125 MG tablet Commonly known as: AUGMENTIN Take 1 tablet by mouth every 12 (twelve) hours for 4 days.   diltiazem 180 MG 24 hr capsule Commonly known as: TIAZAC Take 180 mg by mouth daily.   esomeprazole 20 MG capsule Commonly known as: NEXIUM Take 20 mg by mouth daily at 12 noon.   ferrous sulfate 325 (65 FE) MG tablet Take 1 tablet (325 mg total) by mouth 2 (two) times daily with a meal.   lisinopril 20 MG tablet Commonly known as: ZESTRIL Take 20 mg by mouth daily.   montelukast 10 MG tablet Commonly known as: SINGULAIR Take 10 mg by mouth daily.   nortriptyline 25 MG capsule Commonly known as: PAMELOR Take 25 mg by mouth at bedtime.   ondansetron 4 MG tablet Commonly known as: ZOFRAN Take 1 tablet (4 mg total) by mouth every 8 (eight) hours as needed for nausea or vomiting.   pantoprazole 40 MG tablet Commonly known as: PROTONIX Take 1 tablet (40 mg total) by mouth 2 (two) times daily before a meal.   sertraline 100 MG tablet Commonly known as: ZOLOFT Take 1 tablet (100 mg total) by mouth 2 (two) times daily.   sodium bicarbonate 650 MG tablet Take 1 tablet (650 mg total) by mouth 2 (two) times daily for 6 days.   topiramate 50 MG tablet Commonly known as: TOPAMAX Take 50 mg by mouth daily.   Ventolin HFA 108 (90 Base) MCG/ACT inhaler Generic drug: albuterol Inhale 2 puffs into the lungs every 6 (six) hours as needed for wheezing.   warfarin  10 MG tablet Commonly known as: COUMADIN Take 1 tablet (10 mg total) by mouth daily at 6 PM for 30 doses.      Follow-up Information    Larene Beach, MD. Schedule an appointment as soon as possible for a visit in 1 week(s).   Specialty: Family Medicine Why: Call made to Office for INR check this week- office is to return call to pt. with apointment time  Contact information: [place]  [id number] [phone]        [facility] Follow up.   Specialty: [place] Why: HHRN/PT/OT/aide- referral made for charity care program- they will call to assess eligibility and schedule visits.  Contact information: [address] [place] [address] [phone]          Allergies  Allergen Reactions  . Aspirin     Due to hx of stomach ulcers    You were cared for by a hospitalist during your hospital stay. If you have any questions about your discharge medications or the care you received while you were in the hospital after you are discharged, you can call the unit and asked to speak with the hospitalist on call if the hospitalist that took care of you is not available. Once you are discharged, your primary care physician will handle any further medical issues. Please note that no refills for any discharge medications will be authorized once you are discharged, as it is imperative that you return to your primary care physician (or establish a relationship with a primary care physician if you do not have one) for your aftercare needs so that they can reassess your need for medications and monitor your lab values.   Procedures/Studies: Ct Abdomen Pelvis Wo Contrast  Result Date: 05/29/2019 CLINICAL DATA:  Encephalopathy. EXAM: CT CHEST, ABDOMEN AND PELVIS WITHOUT CONTRAST TECHNIQUE: Multidetector CT imaging of the chest, abdomen and pelvis was performed following the standard protocol without IV contrast. COMPARISON:  CTA chest  05/24/2019 FINDINGS: CT CHEST FINDINGS Cardiovascular: The heart size is normal. No substantial pericardial effusion. No thoracic aortic aneurysm. Mediastinum/Nodes: No mediastinal lymphadenopathy. No evidence for gross hilar lymphadenopathy although assessment is limited by the lack of intravenous contrast on today's study. The esophagus has normal imaging features. There is no axillary lymphadenopathy. Large hiatal hernia. Lungs/Pleura: No suspicious pulmonary nodule or mass. No focal airspace consolidation. Minimal atelectasis noted in the posterior bases, left greater than right. No pleural effusion. Musculoskeletal: No worrisome lytic or sclerotic osseous abnormality. CT ABDOMEN PELVIS FINDINGS Hepatobiliary: No focal abnormality in the liver on this study without intravenous contrast. Gallbladder is surgically absent. No intrahepatic or extrahepatic biliary dilation. Pancreas: Pancreas is poorly visualized and a degree of pancreatic and peripancreatic edema cannot be excluded. Spleen: No splenomegaly. No focal mass lesion. Adrenals/Urinary Tract: No adrenal nodule or mass. No gross renal mass or hydronephrosis. No definite hydroureter. And time a pelvis obscured by artifact from large body habitus. Stomach/Bowel: Large hiatal hernia. No small bowel dilatation to suggest obstruction. No colonic dilatation. Small bowel loops and colonic segments of the pelvis are obscured. Vascular/Lymphatic: No abdominal aortic aneurysm. IVC filter noted in situ. No bulky abdominal lymphadenopathy. Pelvic sidewalls are obscured. Reproductive: Obscured. Other: No gross free fluid in the abdomen.  Pelvis is obscured. Musculoskeletal: No worrisome lytic or sclerotic osseous abnormality. IMPRESSION: 1. Markedly limited study due to technical factors related to the patient's large body habitus. 2. Pancreas is not well seen but intra pancreatic and peripancreatic is suggested although this may be artifactual. As such correlation for  pancreatitis recommended. 3. Large hiatal hernia. Electronically Signed   By: Misty Stanley M.D.   On: 05/29/2019 18:42   Ct Head Wo Contrast  Result Date: 05/30/2019 CLINICAL DATA:  Encephalopathy EXAM: CT HEAD WITHOUT CONTRAST TECHNIQUE: Contiguous axial images were obtained from the base of the skull through the vertex without intravenous contrast. COMPARISON:  CT head 05/29/2019 FINDINGS: Brain: Extensive streak artifact from  the patient's upper extremities resultant beam hardening and may obscure detection of subtle abnormalities. No evidence of acute infarction, hemorrhage, hydrocephalus, extra-axial collection or mass lesion/mass effect. Patchy areas of white matter hypoattenuation are most compatible with chronic microvascular angiopathy. Vascular: Atherosclerotic calcification of the carotid siphons. No hyperdense vessel. Skull: Normal. Negative for fracture or focal lesion. Sinuses/Orbits: Nonspecific mineralization of the bilateral retinal with sparing of the optic discs. Mild mural thickening in the right maxillary sinus. Mostly edentulous. Other: Initial acquisition includes and much of the left shoulder soft tissues. Features of calcific tendinopathy with mild glenohumeral and acromioclavicular arthrosis. IMPRESSION: Extensive streak artifact from the patient's upper extremities with resultant beam hardening and may limit detection of subtle abnormalities. No acute intracranial abnormality. Age advanced white matter microvascular changes. Nonspecific bilateral retinal mineralization. Correlate with ocular history. Left shoulder arthropathy and calcific tendinosis. Electronically Signed   By: Lovena Le M.D.   On: 05/30/2019 18:58   Ct Head Wo Contrast  Result Date: 05/29/2019 CLINICAL DATA:  Encephalopathy.  Morbid obesity EXAM: CT HEAD WITHOUT CONTRAST TECHNIQUE: Contiguous axial images were obtained from the base of the skull through the vertex without intravenous contrast. COMPARISON:   None. FINDINGS: Brain: Examination is markedly limited due to motion. The patient was not able to fit into the scanner with arms down therefore the arms above the head causing significant artifact. Ventricle size normal. No hemorrhage mass or midline shift. Early ischemic infarction or brain edema would be difficult to identify on the study. IMPRESSION: Markedly limited study.  No acute abnormality. Electronically Signed   By: Franchot Gallo M.D.   On: 05/29/2019 18:54   Ct Chest Wo Contrast  Result Date: 05/29/2019 CLINICAL DATA:  Encephalopathy. EXAM: CT CHEST, ABDOMEN AND PELVIS WITHOUT CONTRAST TECHNIQUE: Multidetector CT imaging of the chest, abdomen and pelvis was performed following the standard protocol without IV contrast. COMPARISON:  CTA chest 05/24/2019 FINDINGS: CT CHEST FINDINGS Cardiovascular: The heart size is normal. No substantial pericardial effusion. No thoracic aortic aneurysm. Mediastinum/Nodes: No mediastinal lymphadenopathy. No evidence for gross hilar lymphadenopathy although assessment is limited by the lack of intravenous contrast on today's study. The esophagus has normal imaging features. There is no axillary lymphadenopathy. Large hiatal hernia. Lungs/Pleura: No suspicious pulmonary nodule or mass. No focal airspace consolidation. Minimal atelectasis noted in the posterior bases, left greater than right. No pleural effusion. Musculoskeletal: No worrisome lytic or sclerotic osseous abnormality. CT ABDOMEN PELVIS FINDINGS Hepatobiliary: No focal abnormality in the liver on this study without intravenous contrast. Gallbladder is surgically absent. No intrahepatic or extrahepatic biliary dilation. Pancreas: Pancreas is poorly visualized and a degree of pancreatic and peripancreatic edema cannot be excluded. Spleen: No splenomegaly. No focal mass lesion. Adrenals/Urinary Tract: No adrenal nodule or mass. No gross renal mass or hydronephrosis. No definite hydroureter. And time a pelvis  obscured by artifact from large body habitus. Stomach/Bowel: Large hiatal hernia. No small bowel dilatation to suggest obstruction. No colonic dilatation. Small bowel loops and colonic segments of the pelvis are obscured. Vascular/Lymphatic: No abdominal aortic aneurysm. IVC filter noted in situ. No bulky abdominal lymphadenopathy. Pelvic sidewalls are obscured. Reproductive: Obscured. Other: No gross free fluid in the abdomen.  Pelvis is obscured. Musculoskeletal: No worrisome lytic or sclerotic osseous abnormality. IMPRESSION: 1. Markedly limited study due to technical factors related to the patient's large body habitus. 2. Pancreas is not well seen but intra pancreatic and peripancreatic is suggested although this may be artifactual. As such correlation for pancreatitis recommended. 3. Large  hiatal hernia. Electronically Signed   By: Misty Stanley M.D.   On: 05/29/2019 18:42   Ct Angio Chest Pe W Or Wo Contrast  Result Date: 05/24/2019 CLINICAL DATA:  Chest pain EXAM: CT ANGIOGRAPHY CHEST WITH CONTRAST TECHNIQUE: Multidetector CT imaging of the chest was performed using the standard protocol during bolus administration of intravenous contrast. Multiplanar CT image reconstructions and MIPs were obtained to evaluate the vascular anatomy. CONTRAST:  173mL OMNIPAQUE IOHEXOL 350 MG/ML SOLN COMPARISON:  None. FINDINGS: Cardiovascular: The thoracic aorta shows no aneurysmal dilatation or evidence of dissection. No cardiac enlargement is seen. No coronary calcifications are noted. Pulmonary artery shows a normal branching pattern without intraluminal filling defect to suggest pulmonary embolism. Mediastinum/Nodes: Large hiatal hernia is noted with approximately 50% of the stomach within the chest cavity. Thoracic inlet is within normal limits. No hilar or mediastinal adenopathy is noted. Lungs/Pleura: Lungs are well aerated bilaterally. No focal infiltrate or sizable effusion is seen. No parenchymal nodules are  noted. Upper Abdomen: Visualized upper abdomen shows the large hiatal hernia. There is a focus of increased attenuation identified posteriorly best seen on image number 177 of series 7. It would be difficult to exclude the possibility of gastric hemorrhage based on this density. Musculoskeletal: No acute bony abnormality is noted. Review of the MIP images confirms the above findings. IMPRESSION: No evidence of pulmonary emboli. Large hiatal hernia. There is a dependent density within the hiatal hernia suspicious for active hemorrhage. Clinical correlation is recommended. These results will be called to the ordering clinician or representative by the Radiologist Assistant, and communication documented in the PACS or zVision Dashboard. Electronically Signed   By: Inez Catalina M.D.   On: 05/24/2019 19:02   US Renal  Result Date: 05/25/2019 CLINICAL DATA:  Acute renal failure EXAM: RENAL ULTRASOUND COMPARISON:  None. FINDINGS: Right Kidney: Renal measurements: 13.3 x 5.3 x 5.5 cm = volume: 193.80 mL . Echogenicity and renal cortical thickness are within normal limits. No mass, perinephric fluid, or hydronephrosis visualized. No sonographically demonstrable calculus or ureterectasis. Left Kidney: Renal measurements: 10.8 x 5.3 x 5.7 cm = volume: 169.9 mL. Echogenicity and renal cortical thickness are within normal limits. No mass, perinephric fluid, or hydronephrosis visualized. No sonographically demonstrable calculus or ureterectasis. Bladder: Unable to visualize; suspect empty. IMPRESSION: There appears to be a degree of size discrepancy between the kidneys. Significance of this finding is uncertain. This finding potentially could indicate a degree of renal artery stenosis on left. In this regard, question whether patient is hypertensive. Kidneys otherwise appear unremarkable. Urinary bladder not visualized and presumed empty. Electronically Signed   By: Lowella Grip III M.D.   On: 05/25/2019 10:31   US  Venous Img Lower Right (dvt Study)  Result Date: 05/24/2019 CLINICAL DATA:  Right leg pain for 2 days. EXAM: Right LOWER EXTREMITY VENOUS DOPPLER ULTRASOUND TECHNIQUE: Gray-scale sonography with graded compression, as well as color Doppler and duplex ultrasound were performed to evaluate the lower extremity deep venous systems from the level of the common femoral vein and including the common femoral, femoral, profunda femoral, popliteal and calf veins including the posterior tibial, peroneal and gastrocnemius veins when visible. The superficial great saphenous vein was also interrogated. Spectral Doppler was utilized to evaluate flow at rest and with distal augmentation maneuvers in the common femoral, femoral and popliteal veins. COMPARISON:  None. FINDINGS: Contralateral Common Femoral Vein: Noncompressible acute appearing thrombus. Common Femoral Vein: Noncompressible acute appearing thrombus. Saphenofemoral Junction: Noncompressible acute appearing thrombus. Profunda  Femoral Vein: Noncompressible acute appearing thrombus. Femoral Vein: Noncompressible acute appearing thrombus. Popliteal Vein: Noncompressible acute appearing thrombus. Calf Veins: Thrombus evident in the posterior tibial and peroneal veins. Superficial Great Saphenous Vein: Evidence of thrombus. Venous Reflux:  None. Other Findings:  None. IMPRESSION: Diffuse acute deep venous thrombosis extending from the right common femoral vein through the calf veins. Electronically Signed   By: Lajean Manes M.D.   On: 05/24/2019 10:07   Portable Chest 1 View  Result Date: 05/25/2019 CLINICAL DATA:  Pneumonia.  Follow-up exam. EXAM: PORTABLE CHEST 1 VIEW COMPARISON:  CTA chest, 05/24/2019. FINDINGS: Exam limited by the semi-erect, rotated AP technique. The left lung base is obscured. The visualized portions of the lungs are clear. IMPRESSION: 1. No acute cardiopulmonary disease. Left lower lung is not well assessed on this limited AP study.  Electronically Signed   By: Lajean Manes M.D.   On: 05/25/2019 09:01   Dg Chest Port 1 View  Result Date: 05/24/2019 CLINICAL DATA:  Hypotension EXAM: PORTABLE CHEST 1 VIEW COMPARISON:  February 21 2017 FINDINGS: Airspace opacity is noted in the medial left base. There is cardiomegaly with pulmonary vascularity within normal limits. No adenopathy. No bone lesions. IMPRESSION: Opacity medial left base concerning for consolidation/pneumonia. Lungs elsewhere clear. There is cardiomegaly. No adenopathy evident. Electronically Signed   By: Lowella Grip III M.D.   On: 05/24/2019 09:08   Dg Femur Portable Min 2 Views Right  Result Date: 05/24/2019 CLINICAL DATA:  Reason for exam:Patient states about 2 days ago she was getting out of the shower when she was walking her legs bumped together and she felt a pop in her right leg. Patient complaining of right leg pain. Patient was able to stand and ambulate to the bed. Patient took 2 Ibuprofen at home a few hours ago and states that it did help with pain but pain level is a 5 currently EXAM: RIGHT FEMUR PORTABLE 2 VIEW COMPARISON:  None. FINDINGS: Exam limited by patient's body habitus. Allowing for this, there is no convincing fracture. No bone lesion. Hip and knee joints appear normally aligned. IMPRESSION: Limited exam.  No convincing fracture.  No dislocation. Electronically Signed   By: Lajean Manes M.D.   On: 05/24/2019 08:05      The results of significant diagnostics from this hospitalization (including imaging, microbiology, ancillary and laboratory) are listed below for reference.     Microbiology: Recent Results (from the past 240 hour(s))  Urine Culture     Status: None   Collection Time: 05/25/19  3:50 AM   Specimen: Urine, Catheterized  Result Value Ref Range Status   Specimen Description URINE, CATHETERIZED  Final   Special Requests NONE  Final   Culture   Final    NO GROWTH Performed at [facility], [address],  [place], [place] [address]    Report Status 05/26/2019 FINAL  Final  Urine Culture     Status: None   Collection Time: 05/27/19  8:12 AM   Specimen: Urine, Random  Result Value Ref Range Status   Specimen Description URINE, RANDOM  Final   Special Requests NONE  Final   Culture   Final    NO GROWTH Performed at [facility], [place] [address] [place],  [address]    Report Status 05/28/2019 FINAL  Final  MRSA PCR Screening     Status: None   Collection Time: 05/29/19  6:46 PM   Specimen: Nasal Mucosa; Nasopharyngeal  Result Value Ref Range Status   MRSA by PCR NEGATIVE NEGATIVE Final    Comment:        The GeneXpert MRSA Assay (FDA approved for NASAL specimens only), is one component of a comprehensive MRSA colonization surveillance program. It is not intended to diagnose MRSA infection nor to guide or monitor treatment for MRSA infections. Performed at [facility], [place] [address] [place], [place] [address]   Culture, blood (routine x 2)     Status: None   Collection Time: 05/29/19  7:15 PM   Specimen: BLOOD  Result Value Ref Range Status   Specimen Description BLOOD RIGHT ANTECUBITAL  Final   Special Requests   Final    BOTTLES DRAWN AEROBIC AND ANAEROBIC Blood Culture adequate volume   Culture   Final    NO GROWTH 5 DAYS Performed at [facility], [place] [address], [place], [place] [address]    Report Status 06/03/2019 FINAL  Final  Culture, blood (routine x 2)     Status: None   Collection Time: 05/29/19  7:20 PM   Specimen: BLOOD RIGHT WRIST  Result Value Ref Range Status   Specimen Description BLOOD RIGHT WRIST  Final   Special Requests   Final    BOTTLES DRAWN AEROBIC ONLY Blood Culture results may not be optimal due to an inadequate volume of blood received in culture bottles   Culture   Final    NO GROWTH 5 DAYS Performed at [facility], [place] [address], [place],  [address]    Report Status 06/03/2019 FINAL  Final      Labs: BNP (last 3 results) No results for input(s): BNP in the last 8760 hours. Basic Metabolic Panel: Recent Labs  Lab 05/29/19 0551 05/30/19 0425 05/31/19 0243 06/01/19 0850 06/02/19 0238  NA 132* 131* 130* 132* 134*  K 4.2 4.4 4.2 3.7 3.5  CL 97* 99 99 101 104  CO2 21* 19* 16* 17* 20*  GLUCOSE 106* 93 101* 120* 121*  BUN 45* 46* 47* 38* 31*  CREATININE 4.78* 4.67* 3.82* 2.54* 1.85*  CALCIUM 11.0* 10.3 10.2 10.2 10.0  PHOS  --  6.7*  --   --   --    Liver Function Tests: Recent Labs  Lab 05/28/19 0343 05/29/19 0551 05/30/19 0425  AST 14* 11*  --   ALT 13 13  --   ALKPHOS 66 65  --   BILITOT 0.9 0.4  --   PROT 7.6 7.2  --   ALBUMIN 2.8* 2.6* 3.8   Recent Labs  Lab 05/29/19 1920  LIPASE 34   Recent Labs  Lab 05/29/19 0551  AMMONIA 19   CBC: Recent Labs  Lab 05/28/19 0343  05/29/19 0551  05/30/19 0425 05/30/19 2010 05/31/19 0243 06/01/19 0850 06/02/19 0238 06/03/19 0321  WBC 13.5*   < > 15.3*  --  12.8*  --  13.1* 12.1* 12.4* 12.8*  NEUTROABS 10.8*  --  10.7*  --   --   --   --   --   --   --   HGB 7.4*   < > 7.4*  --  6.7* 8.4* 8.6* 8.7* 8.2* 8.5*  HCT 26.8*   < > 27.2*   < > 22.9* 29.0* 29.7* 29.8* 27.5* 29.9*  MCV 76.6*   < > 78.2*  --  76.6*  --  78.0* 78.8* 77.7* 81.0  PLT 369   < > 388  --  340  --  344 366 338 378   < > = values in this interval not displayed.   Cardiac Enzymes: No results for input(s): CKTOTAL, CKMB, CKMBINDEX, TROPONINI in the last 168 hours. BNP: Invalid input(s): POCBNP CBG: Recent Labs  Lab 05/29/19 2026 05/30/19 0007 05/30/19 0437 05/30/19 1150 05/30/19 1647  GLUCAP 60* 82 72 77 87   D-Dimer No results for input(s): DDIMER in the last 72 hours. Hgb A1c No results for input(s): HGBA1C in the last 72 hours. Lipid Profile No results for input(s): CHOL, HDL, LDLCALC, TRIG, CHOLHDL, LDLDIRECT in the last 72 hours. Thyroid function studies No results for input(s): TSH, T4TOTAL, T3FREE, THYROIDAB in the last  72 hours.  Invalid input(s): FREET3 Anemia work up No results for input(s): VITAMINB12, FOLATE, FERRITIN, TIBC, IRON, RETICCTPCT in the last 72 hours. Urinalysis    Component Value Date/Time   COLORURINE AMBER (A) 05/27/2019 1300   APPEARANCEUR CLOUDY (A) 05/27/2019 1300   LABSPEC 1.032 (H) 05/27/2019 1300   PHURINE 5.0 05/27/2019 1300   GLUCOSEU NEGATIVE 05/27/2019 1300   HGBUR NEGATIVE 05/27/2019 1300   BILIRUBINUR NEGATIVE 05/27/2019 1300   KETONESUR 5 (A) 05/27/2019 1300   PROTEINUR NEGATIVE 05/27/2019 1300   UROBILINOGEN 0.2 05/11/2013 1903   NITRITE NEGATIVE 05/27/2019 1300   LEUKOCYTESUR NEGATIVE 05/27/2019 1300   Sepsis Labs Invalid input(s): PROCALCITONIN,  WBC,  LACTICIDVEN Microbiology Recent Results (from the past 240 hour(s))  Urine Culture     Status: None   Collection Time: 05/25/19  3:50 AM   Specimen: Urine, Catheterized  Result Value Ref Range Status   Specimen Description URINE, CATHETERIZED  Final   Special Requests NONE  Final   Culture   Final    NO GROWTH Performed at [facility], [place] [address] [place], [place] [address]    Report Status 05/26/2019 FINAL  Final  Urine Culture     Status: None   Collection Time: 05/27/19  8:12 AM   Specimen: Urine, Random  Result Value Ref Range Status   Specimen Description URINE, RANDOM  Final   Special Requests NONE  Final   Culture   Final    NO GROWTH Performed at [facility], [place] [address], [place], [place] [address]    Report Status 05/28/2019 FINAL  Final  MRSA PCR Screening     Status: None   Collection Time: 05/29/19  6:46 PM   Specimen: Nasal Mucosa; Nasopharyngeal  Result Value Ref Range Status   MRSA by PCR NEGATIVE NEGATIVE Final    Comment:        The GeneXpert MRSA Assay (FDA approved for NASAL specimens only), is one component of a comprehensive MRSA colonization surveillance program. It is not intended to diagnose MRSA infection nor to guide or monitor treatment  for MRSA infections. Performed at [facility], [place] [address], [place], [place] [address]   Culture, blood (routine x 2)     Status: None   Collection Time: 05/29/19  7:15 PM   Specimen: BLOOD  Result Value Ref Range Status   Specimen Description BLOOD RIGHT ANTECUBITAL  Final   Special Requests   Final    BOTTLES DRAWN AEROBIC AND ANAEROBIC Blood Culture adequate volume   Culture   Final    NO GROWTH 5 DAYS Performed at [facility], [place] [address], [place],  [address]    Report Status 06/03/2019 FINAL  Final  Culture, blood (routine x 2)     Status: None  Collection Time: 05/29/19  7:20 PM   Specimen: BLOOD RIGHT WRIST  Result Value Ref Range Status   Specimen Description BLOOD RIGHT WRIST  Final   Special Requests   Final    BOTTLES DRAWN AEROBIC ONLY Blood Culture results may not be optimal due to an inadequate volume of blood received in culture bottles   Culture   Final    NO GROWTH 5 DAYS Performed at [facility], [address], [place], [place] [address]    Report Status 06/03/2019 FINAL  Final     Time coordinating discharge:  I have spent 35 minutes face to face with the patient and on the ward discussing the patients care, assessment, plan and disposition with other care givers. >50% of the time was devoted counseling the patient about the risks and benefits of treatment/Discharge disposition and coordinating care.   SIGNED:   Damita Lack, MD  [facility] 06/03/2019, 12:17 PM   If 7PM-7AM, please contact night-coverage [email or web address]

## 2019-06-03 NOTE — Progress Notes (Signed)
Occupational Therapy Treatment Patient Details Name: Megan Ortiz MRN: [id number] DOB: [birth year] Today's Date: 06/03/2019    History of present illness Patient is a pleasant 43 year old female with morbid obesity, asthma, hypertension, hiatal hernia, bleeding gastric ulcer over 10 years ago while living in [place], history of PE approximately 10 years ago per patient was on anticoagulation that has subsequently been discontinued, history of necrotizing fasciitis who presented to the ED with complaints of 2 days of right leg pain.  Work-up consistent with extensive right lower extremity DVT from right common femoral vein down to the calf veins.  Patient noted to have a leukocytosis of 18.3, hemoglobin of 7.2.  Has IVC filter.  Also underwent Esophagogastroduodenoscopy.     OT comments  Patient supine in bed and reports eager to dc home today.  Patient reports having support of her husband and father at dc, as needed.  She currently requires min assist for bed mobility, min guard for transfers and short distance mobility using RW.  Completes grooming with min guard in standing and supervision in sitting.  Initiated education on energy conservation and use of AE for self care. Cognition clearing, but continues to have decreased awareness to deficits and safety as reports "I'm able to put my shoes"  but when asked to show the therapist how she does it she says "I can't here" requiring cueing to problem solve through safety and deficits due to decreased ROM of RLE. Noted HR increased from 105 to 153 with mobility, RN present and aware.  Will follow acutely. Updated dc plan to [facility] and pt agreeable to assist with all mobility and self care at this time.    Follow Up Recommendations  Home health OT;Supervision/Assistance - 24 hour    Equipment Recommendations  3 in 1 bedside commode(bariatric )    Recommendations for Other Services      Precautions / Restrictions Precautions Precautions:  Fall Restrictions Weight Bearing Restrictions: No       Mobility Bed Mobility Overal bed mobility: Needs Assistance Bed Mobility: Supine to Sit;Sit to Supine     Supine to sit: Min assist Sit to supine: Min assist   General bed mobility comments: min assist to manage R LE out of and into bed; elevated R LE once supine   Transfers Overall transfer level: Needs assistance Equipment used: Rolling walker (2 wheeled) Transfers: Sit to/from Stand Sit to Stand: Min guard         General transfer comment: min guard for safety and balance    Balance Overall balance assessment: Needs assistance Sitting-balance support: No upper extremity supported;Feet supported Sitting balance-Leahy Scale: Fair     Standing balance support: Bilateral upper extremity supported;During functional activity Standing balance-Leahy Scale: Fair Standing balance comment: relaint on B UE support functionally, leaning forward at sink to have UE support                           ADL either performed or assessed with clinical judgement   ADL Overall ADL's : Needs assistance/impaired Eating/Feeding: Supervision/ safety Eating/Feeding Details (indicate cue type and reason): reaching for drink on table with supervision  Grooming: Min guard;Supervision/safety;Standing;Sitting;Wash/dry hands;Wash/dry face;Brushing hair Grooming Details (indicate cue type and reason): standing at sink min guard to wash face/hands, returned to EOB to brush hair with supervision seated             Lower Body Dressing: Total assistance;Sit to/from stand Lower Body Dressing Details (indicate cue type  and reason): total assist to don socks, but baseline does not complete this task-- reviewed LB AE in hip kit that was in room  Toilet Transfer: Min guard;Ambulation;RW Toilet Transfer Details (indicate cue type and reason): simulated to/from EOB          Functional mobility during ADLs: Min guard;Rolling  walker;Cueing for safety General ADL Comments: min guard for safety and balance, cueing for line mgmt; initated education on energy conservation      Vision       Perception     Praxis      Cognition Arousal/Alertness: Awake/alert Behavior During Therapy: WFL for tasks assessed/performed Overall Cognitive Status: Impaired/Different from baseline Area of Impairment: Awareness;Problem solving;Safety/judgement                         Safety/Judgement: Decreased awareness of safety;Decreased awareness of deficits Awareness: Emergent Problem Solving: Slow processing;Decreased initiation;Difficulty sequencing;Requires verbal cues;Requires tactile cues General Comments: patient engaged and conversational today, she is eager to dc home today; anxious at times.  she reports she will do better at home, and requires cueing for increased awareness to understand deficits and safety in regards to decreased mobility and decontioning from hospital stay         Exercises     Shoulder Instructions       General Comments initated education on energy conservation techniques; noted HR increased from 105-153 with mobility and ADLs, cueing for pursed lip breathing     Pertinent Vitals/ Pain       Pain Assessment: Faces Faces Pain Scale: Hurts a little bit Pain Location: R LE with mobility  Pain Descriptors / Indicators: Grimacing Pain Intervention(s): Limited activity within patient's tolerance;Monitored during session  Home Living                                          Prior Functioning/Environment              Frequency  Min 2X/week        Progress Toward Goals  OT Goals(current goals can now be found in the care plan section)  Progress towards OT goals: Progressing toward goals  Acute Rehab OT Goals Patient Stated Goal: to go home today  OT Goal Formulation: With patient  Plan Discharge plan needs to be updated;Frequency remains appropriate     Co-evaluation                 AM-PAC OT "6 Clicks" Daily Activity     Outcome Measure   Help from another person eating meals?: A Little Help from another person taking care of personal grooming?: A Little Help from another person toileting, which includes using toliet, bedpan, or urinal?: A Lot Help from another person bathing (including washing, rinsing, drying)?: A Lot Help from another person to put on and taking off regular upper body clothing?: A Lot Help from another person to put on and taking off regular lower body clothing?: Total 6 Click Score: 13    End of Session Equipment Utilized During Treatment: Rolling walker  OT Visit Diagnosis: Unsteadiness on feet (R26.81);Muscle weakness (generalized) (M62.81);Pain Pain - Right/Left: Right Pain - part of body: Leg   Activity Tolerance Patient tolerated treatment well   Patient Left in bed;with call bell/phone within reach;with nursing/sitter in room   Nurse Communication Mobility status  Time: 2550-0164 OT Time Calculation (min): 33 min  Charges: OT General Charges $OT Visit: 1 Visit OT Treatments $Self Care/Home Management : 23-37 mins  Delight Stare, [place] Pager [phone] Office [phone]    Delight Stare 06/03/2019, 10:14 AM

## 2019-06-03 NOTE — Progress Notes (Signed)
Patient in a stable condition discharge education reviewed with patient she verbalized understanding, iv removed, tele dc ccmd notified, patient belongings at bedside, patient awaiting her husband for transportation home.

## 2019-06-03 NOTE — Progress Notes (Signed)
ANTICOAGULATION CONSULT NOTE - Follow Up Consult  Pharmacy Consult for Heparin/Coumadin Indication: DVT  Allergies  Allergen Reactions  . Aspirin     Due to hx of stomach ulcers    Patient Measurements: Height: 5\' 5"  (165.1 cm) Weight: (bed scale nor st scale took weight. day shift notified) IBW/kg (Calculated) : 57  Vital Signs: Temp: 98.2 F (36.8 C) (07/20 2024) Temp Source: Oral (07/20 2024) BP: 109/82 (07/20 2024)  Labs: Recent Labs    06/01/19 0850 06/02/19 0238 06/03/19 0321  HGB 8.7* 8.2* 8.5*  HCT 29.8* 27.5* 29.9*  PLT 366 338 378  LABPROT 24.1* 22.6* 20.6*  INR 2.2* 2.0* 1.8*  HEPARINUNFRC 0.40 0.35 0.42  CREATININE 2.54* 1.85*  --     Estimated Creatinine Clearance: 82.5 mL/min (A) (by C-G formula based on SCr of 1.85 mg/dL (H)).  Assessment: Anticoag: Extensive RLE DVT s/p IVC filter placement- CT angio negative for PE. 7/11 heparin was DC due to GIB, resumed 7/12. Resuming warfarin 7/20 - HL 0.42,  CBC and plts stable - INR 1.8 down (LD 7/15) just resumed 7/20, but dose not charted last PM.  Goal of Therapy:  Heparin level 0.3-0.7 units/ml  INR 2-3 Monitor platelets by anticoagulation protocol: Yes   Plan:  Heparin 2800 units/hr Warfarin 10mg  po x 1 NOW Daily HL, CBC, and INR   Zyheir Daft S. Alford [place]land, PharmD, BCPS Clinical Staff Pharmacist Eilene Ghazi Stillinger 06/03/2019,8:18 AM

## 2019-06-03 NOTE — TOC Transition Note (Signed)
Transition of Care [facility]) - CM/SW Discharge Note Marvetta Gibbons RN, BSN Transitions of Care Unit 4E- RN Case Manager [phone]   Patient Details  Name: Megan Ortiz MRN: [id number] Date of Birth: 24-Oct-1976  Transition of Care [facility] CM/SW Contact:  Dawayne Patricia, RN Phone Number: 06/03/2019, 11:54 AM   Clinical Narrative:    Pt stable for transition home today, CM spoke with pt at bedside regarding [place] needs pt agreeable to [facility]- Orders placed last week for HHRN/PT/OT/aide- notified Tiffany with [facility] who will f/u with pt for charity assessment. CM also called pt's PCP for INR check appointment- per office they will have to "work" pt in- and will call her with appointment for later this week- pt aware of pending call from MD office regarding appointment. Reviewed meds and pt request scripts be sent to [facility] in [place]- no assist needed. Pt reports her spouse to transport home.    Final next level of care: [place] Barriers to Discharge: Barriers Resolved, No Barriers Identified   Patient Goals and CMS Choice Patient states their goals for this hospitalization and ongoing recovery are:: "to get rid of these blood clots and never get them again" CMS Medicare.gov Compare Post Acute Care list provided to:: Patient Choice offered to / list presented to : NA  Discharge Placement   Home with [facility]                     Discharge Plan and Services   Discharge Planning Services: CM Consult Post Acute Care Choice: Home Health          DME Arranged: N/A DME Agency: NA       HH Arranged: RN, PT, OT, Nurse's Aide [place] Agency: [facility] [employer]) Date [place]: 06/03/19 Time [place]: 1040 Representative spoke with at [place]: [place] ([place]) Interventions     Readmission Risk Interventions Readmission  Risk Prevention Plan 06/03/2019  Transportation Screening Complete  PCP or Specialist Appt within 5-7 Days Complete  Home Care Screening Complete  Medication Review (RN CM) Complete  Some recent data might be hidden

## 2019-07-19 ENCOUNTER — Encounter (HOSPITAL_COMMUNITY): Payer: Self-pay | Admitting: Emergency Medicine

## 2019-07-19 ENCOUNTER — Emergency Department (HOSPITAL_COMMUNITY): Payer: Self-pay

## 2019-07-19 ENCOUNTER — Other Ambulatory Visit: Payer: Self-pay

## 2019-07-19 ENCOUNTER — Inpatient Hospital Stay (HOSPITAL_COMMUNITY)
Admission: EM | Admit: 2019-07-19 | Discharge: 2019-08-06 | DRG: 299 | Disposition: A | Discharge status: Skilled Nursing Facility | Payer: Self-pay | Attending: Family Medicine | Admitting: Family Medicine

## 2019-07-19 ENCOUNTER — Other Ambulatory Visit: Payer: Self-pay | Admitting: Internal Medicine

## 2019-07-19 DIAGNOSIS — K449 Diaphragmatic hernia without obstruction or gangrene: Secondary | ICD-10-CM | POA: Diagnosis present

## 2019-07-19 DIAGNOSIS — K219 Gastro-esophageal reflux disease without esophagitis: Secondary | ICD-10-CM | POA: Diagnosis present

## 2019-07-19 DIAGNOSIS — D649 Anemia, unspecified: Secondary | ICD-10-CM

## 2019-07-19 DIAGNOSIS — I959 Hypotension, unspecified: Secondary | ICD-10-CM | POA: Diagnosis not present

## 2019-07-19 DIAGNOSIS — N189 Chronic kidney disease, unspecified: Secondary | ICD-10-CM | POA: Diagnosis present

## 2019-07-19 DIAGNOSIS — N141 Nephropathy induced by other drugs, medicaments and biological substances: Secondary | ICD-10-CM | POA: Diagnosis not present

## 2019-07-19 DIAGNOSIS — Z419 Encounter for procedure for purposes other than remedying health state, unspecified: Secondary | ICD-10-CM

## 2019-07-19 DIAGNOSIS — F39 Unspecified mood [affective] disorder: Secondary | ICD-10-CM | POA: Diagnosis present

## 2019-07-19 DIAGNOSIS — R0682 Tachypnea, not elsewhere classified: Secondary | ICD-10-CM

## 2019-07-19 DIAGNOSIS — M7989 Other specified soft tissue disorders: Secondary | ICD-10-CM

## 2019-07-19 DIAGNOSIS — I96 Gangrene, not elsewhere classified: Principal | ICD-10-CM | POA: Diagnosis present

## 2019-07-19 DIAGNOSIS — L0390 Cellulitis, unspecified: Secondary | ICD-10-CM | POA: Diagnosis present

## 2019-07-19 DIAGNOSIS — Z87891 Personal history of nicotine dependence: Secondary | ICD-10-CM

## 2019-07-19 DIAGNOSIS — Z886 Allergy status to analgesic agent status: Secondary | ICD-10-CM

## 2019-07-19 DIAGNOSIS — L02415 Cutaneous abscess of right lower limb: Secondary | ICD-10-CM

## 2019-07-19 DIAGNOSIS — Z20828 Contact with and (suspected) exposure to other viral communicable diseases: Secondary | ICD-10-CM | POA: Diagnosis present

## 2019-07-19 DIAGNOSIS — I10 Essential (primary) hypertension: Secondary | ICD-10-CM

## 2019-07-19 DIAGNOSIS — S8011XA Contusion of right lower leg, initial encounter: Secondary | ICD-10-CM | POA: Diagnosis present

## 2019-07-19 DIAGNOSIS — N170 Acute kidney failure with tubular necrosis: Secondary | ICD-10-CM | POA: Diagnosis not present

## 2019-07-19 DIAGNOSIS — E876 Hypokalemia: Secondary | ICD-10-CM | POA: Diagnosis not present

## 2019-07-19 DIAGNOSIS — L89303 Pressure ulcer of unspecified buttock, stage 3: Secondary | ICD-10-CM | POA: Diagnosis present

## 2019-07-19 DIAGNOSIS — T45515A Adverse effect of anticoagulants, initial encounter: Secondary | ICD-10-CM | POA: Diagnosis present

## 2019-07-19 DIAGNOSIS — Z818 Family history of other mental and behavioral disorders: Secondary | ICD-10-CM

## 2019-07-19 DIAGNOSIS — Z86718 Personal history of other venous thrombosis and embolism: Secondary | ICD-10-CM

## 2019-07-19 DIAGNOSIS — Z95828 Presence of other vascular implants and grafts: Secondary | ICD-10-CM

## 2019-07-19 DIAGNOSIS — Z86711 Personal history of pulmonary embolism: Secondary | ICD-10-CM

## 2019-07-19 DIAGNOSIS — Z811 Family history of alcohol abuse and dependence: Secondary | ICD-10-CM

## 2019-07-19 DIAGNOSIS — I82411 Acute embolism and thrombosis of right femoral vein: Secondary | ICD-10-CM

## 2019-07-19 DIAGNOSIS — I509 Heart failure, unspecified: Secondary | ICD-10-CM | POA: Diagnosis present

## 2019-07-19 DIAGNOSIS — R791 Abnormal coagulation profile: Secondary | ICD-10-CM | POA: Diagnosis present

## 2019-07-19 DIAGNOSIS — I82409 Acute embolism and thrombosis of unspecified deep veins of unspecified lower extremity: Secondary | ICD-10-CM | POA: Diagnosis present

## 2019-07-19 DIAGNOSIS — Z992 Dependence on renal dialysis: Secondary | ICD-10-CM

## 2019-07-19 DIAGNOSIS — Z23 Encounter for immunization: Secondary | ICD-10-CM

## 2019-07-19 DIAGNOSIS — D638 Anemia in other chronic diseases classified elsewhere: Secondary | ICD-10-CM | POA: Diagnosis present

## 2019-07-19 DIAGNOSIS — L03115 Cellulitis of right lower limb: Secondary | ICD-10-CM | POA: Diagnosis present

## 2019-07-19 DIAGNOSIS — T368X5A Adverse effect of other systemic antibiotics, initial encounter: Secondary | ICD-10-CM | POA: Diagnosis not present

## 2019-07-19 DIAGNOSIS — N179 Acute kidney failure, unspecified: Secondary | ICD-10-CM

## 2019-07-19 DIAGNOSIS — Z6841 Body Mass Index (BMI) 40.0 and over, adult: Secondary | ICD-10-CM

## 2019-07-19 DIAGNOSIS — I129 Hypertensive chronic kidney disease with stage 1 through stage 4 chronic kidney disease, or unspecified chronic kidney disease: Secondary | ICD-10-CM | POA: Diagnosis present

## 2019-07-19 HISTORY — DX: Acute embolism and thrombosis of unspecified deep veins of unspecified lower extremity: I82.409

## 2019-07-19 LAB — PREGNANCY, URINE: Preg Test, Ur: NEGATIVE

## 2019-07-19 LAB — COMPREHENSIVE METABOLIC PANEL
ALT: 10 U/L (ref 0–44)
AST: 11 U/L — ABNORMAL LOW (ref 15–41)
Albumin: 3.3 g/dL — ABNORMAL LOW (ref 3.5–5.0)
Alkaline Phosphatase: 63 U/L (ref 38–126)
Anion gap: 10 (ref 5–15)
BUN: 11 mg/dL (ref 6–20)
CO2: 24 mmol/L (ref 22–32)
Calcium: 9.1 mg/dL (ref 8.9–10.3)
Chloride: 101 mmol/L (ref 98–111)
Creatinine, Ser: 0.62 mg/dL (ref 0.44–1.00)
GFR calc Af Amer: >60 mL/min (ref >60)
GFR calc non Af Amer: >60 mL/min (ref >60)
Glucose, Bld: 103 mg/dL — ABNORMAL HIGH (ref 70–99)
Potassium: 4.1 mmol/L (ref 3.5–5.1)
Sodium: 135 mmol/L (ref 135–145)
Total Bilirubin: 0.1 mg/dL — ABNORMAL LOW (ref 0.3–1.2)
Total Protein: 8.5 g/dL — ABNORMAL HIGH (ref 6.5–8.1)

## 2019-07-19 LAB — CBC
HCT: 30 % — ABNORMAL LOW (ref 36.0–46.0)
Hemoglobin: 8.6 g/dL — ABNORMAL LOW (ref 12.0–15.0)
MCH: 25.9 pg — ABNORMAL LOW (ref 26.0–34.0)
MCHC: 28.7 g/dL — ABNORMAL LOW (ref 30.0–36.0)
MCV: 90.4 fL (ref 80.0–100.0)
Platelets: 482 10*3/uL — ABNORMAL HIGH (ref 150–400)
RBC: 3.32 MIL/uL — ABNORMAL LOW (ref 3.87–5.11)
RDW: 22.5 % — ABNORMAL HIGH (ref 11.5–15.5)
WBC: 9.3 10*3/uL (ref 4.0–10.5)
nRBC: 0 % (ref 0.0–0.2)

## 2019-07-19 LAB — APTT: aPTT: 176 seconds (ref 24–36)

## 2019-07-19 LAB — PROTIME-INR
INR: 8.1 (ref 0.8–1.2)
Prothrombin Time: 66.1 seconds — ABNORMAL HIGH (ref 11.4–15.2)

## 2019-07-19 LAB — SARS CORONAVIRUS 2 BY RT PCR (HOSPITAL ORDER, PERFORMED IN ~~LOC~~ HOSPITAL LAB): SARS Coronavirus 2: NEGATIVE

## 2019-07-19 LAB — LACTIC ACID, PLASMA: Lactic Acid, Venous: 1.3 mmol/L (ref 0.5–1.9)

## 2019-07-19 MED ORDER — TOPIRAMATE 25 MG PO TABS
50.0000 mg | ORAL_TABLET | Freq: Every day | ORAL | Status: DC
  Administered 2019-07-19 – 2019-08-06 (×18): 50 mg via ORAL
  Filled 2019-07-19 (×19): qty 2

## 2019-07-19 MED ORDER — VANCOMYCIN HCL IN DEXTROSE 1-5 GM/200ML-% IV SOLN: 1000.0000 mg | Freq: Once | INTRAVENOUS | Status: DC

## 2019-07-19 MED ORDER — PANTOPRAZOLE SODIUM 40 MG PO TBEC
40.0000 mg | DELAYED_RELEASE_TABLET | Freq: Two times a day (BID) | ORAL | Status: DC
  Administered 2019-07-20 – 2019-08-06 (×34): 40 mg via ORAL
  Filled 2019-07-19 (×34): qty 1

## 2019-07-19 MED ORDER — LISINOPRIL 20 MG PO TABS
20.0000 mg | ORAL_TABLET | Freq: Every day | ORAL | Status: DC
  Administered 2019-07-19 – 2019-07-21 (×2): 20 mg via ORAL
  Filled 2019-07-19 (×4): qty 1

## 2019-07-19 MED ORDER — ONDANSETRON HCL 4 MG/2ML IJ SOLN
4.0000 mg | Freq: Four times a day (QID) | INTRAMUSCULAR | Status: DC | PRN
  Administered 2019-07-20 – 2019-08-06 (×18): 4 mg via INTRAVENOUS
  Filled 2019-07-19 (×18): qty 2

## 2019-07-19 MED ORDER — PIPERACILLIN-TAZOBACTAM 3.375 G IVPB 30 MIN: 3.3750 g | Freq: Once | INTRAVENOUS | Status: DC

## 2019-07-19 MED ORDER — PIPERACILLIN-TAZOBACTAM 3.375 G IVPB
3.3750 g | Freq: Three times a day (TID) | INTRAVENOUS | Status: DC
  Administered 2019-07-19 – 2019-07-22 (×9): 3.375 g via INTRAVENOUS
  Filled 2019-07-19 (×9): qty 50

## 2019-07-19 MED ORDER — ACETAMINOPHEN 325 MG PO TABS
650.0000 mg | ORAL_TABLET | Freq: Four times a day (QID) | ORAL | Status: DC | PRN
  Administered 2019-07-21 – 2019-08-04 (×14): 650 mg via ORAL
  Filled 2019-07-19 (×14): qty 2

## 2019-07-19 MED ORDER — VANCOMYCIN HCL IN DEXTROSE 1-5 GM/200ML-% IV SOLN
1000.0000 mg | INTRAVENOUS | Status: AC
  Administered 2019-07-19 (×2): 1000 mg via INTRAVENOUS
  Filled 2019-07-19 (×2): qty 200

## 2019-07-19 MED ORDER — NORTRIPTYLINE HCL 25 MG PO CAPS
25.0000 mg | ORAL_CAPSULE | Freq: Every day | ORAL | Status: DC
  Administered 2019-07-19 – 2019-08-05 (×18): 25 mg via ORAL
  Filled 2019-07-19 (×19): qty 1

## 2019-07-19 MED ORDER — HYDROCODONE-ACETAMINOPHEN 5-325 MG PO TABS
1.0000 | ORAL_TABLET | ORAL | Status: DC | PRN
  Administered 2019-07-19 – 2019-07-20 (×7): 2 via ORAL
  Filled 2019-07-19 (×7): qty 2

## 2019-07-19 MED ORDER — HYDROMORPHONE HCL 1 MG/ML IJ SOLN
1.0000 mg | Freq: Once | INTRAMUSCULAR | Status: AC
  Administered 2019-07-19: 1 mg via INTRAVENOUS
  Filled 2019-07-19: qty 1

## 2019-07-19 MED ORDER — MONTELUKAST SODIUM 10 MG PO TABS
10.0000 mg | ORAL_TABLET | Freq: Every day | ORAL | Status: DC
  Administered 2019-07-19 – 2019-08-06 (×19): 10 mg via ORAL
  Filled 2019-07-19 (×19): qty 1

## 2019-07-19 MED ORDER — PROMETHAZINE HCL 12.5 MG PO TABS
12.5000 mg | ORAL_TABLET | Freq: Once | ORAL | Status: AC
  Administered 2019-07-19: 17:00:00 12.5 mg via ORAL
  Filled 2019-07-19: qty 1

## 2019-07-19 MED ORDER — DILTIAZEM HCL ER BEADS 180 MG PO CP24: 180.0000 mg | ORAL_CAPSULE | Freq: Every day | ORAL | Status: DC

## 2019-07-19 MED ORDER — DILTIAZEM HCL ER COATED BEADS 180 MG PO CP24
180.0000 mg | ORAL_CAPSULE | Freq: Every day | ORAL | Status: DC
  Administered 2019-07-19 – 2019-07-21 (×2): 180 mg via ORAL
  Filled 2019-07-19 (×4): qty 1

## 2019-07-19 MED ORDER — ACETAMINOPHEN 650 MG RE SUPP: 650.0000 mg | Freq: Four times a day (QID) | RECTAL | Status: DC | PRN

## 2019-07-19 MED ORDER — PROMETHAZINE HCL 12.5 MG PO TABS
12.5000 mg | ORAL_TABLET | Freq: Once | ORAL | Status: AC
  Administered 2019-07-19: 12.5 mg via ORAL
  Filled 2019-07-19: qty 1

## 2019-07-19 MED ORDER — VANCOMYCIN HCL 1.5 G IV SOLR
1500.0000 mg | Freq: Three times a day (TID) | INTRAVENOUS | Status: DC
  Administered 2019-07-19 – 2019-07-20 (×2): 1500 mg via INTRAVENOUS
  Filled 2019-07-19 (×8): qty 1500

## 2019-07-19 MED ORDER — SERTRALINE HCL 100 MG PO TABS
100.0000 mg | ORAL_TABLET | Freq: Two times a day (BID) | ORAL | Status: DC
  Administered 2019-07-19 – 2019-08-06 (×36): 100 mg via ORAL
  Filled 2019-07-19 (×36): qty 1

## 2019-07-19 MED ORDER — ONDANSETRON HCL 4 MG PO TABS
4.0000 mg | ORAL_TABLET | Freq: Four times a day (QID) | ORAL | Status: DC | PRN
  Administered 2019-08-03 – 2019-08-06 (×6): 4 mg via ORAL
  Filled 2019-07-19 (×6): qty 1

## 2019-07-19 MED ORDER — PIPERACILLIN-TAZOBACTAM 3.375 G IVPB
3.3750 g | Freq: Once | INTRAVENOUS | Status: AC
  Administered 2019-07-19: 3.375 g via INTRAVENOUS
  Filled 2019-07-19: qty 50

## 2019-07-19 NOTE — ED Provider Notes (Signed)
[facility] EMERGENCY DEPARTMENT Provider Note   CSN: [id number] Arrival date & time: 07/19/19  1054     History   Chief Complaint Chief Complaint  Patient presents with   Leg Pain    HPI Shawnessy Maxham is a 43 y.o. female.     Patient is a 43 year old female who presents to the emergency department with a complaint of right leg pain.  The patient has a history of pulmonary embolus, requiring an IVC filter in the past.  She has a history of necrotizing fasciitis, asthma, morbid obesity, and most recently right leg deep vein thrombus diagnosed in July 2020.  The patient states that in the last 2 weeks she has been having increasing pain of her right lower extremity.  She says she has had some swelling in that area.  She has been trying to manage the pain and swelling on her own.  She says that her doctor is probably an hours drive away from her home.  She says that she could not tolerate the pain, not having any pain medication.  The pain just got so severe that she has difficulty with getting around even with her walker, she presents now to the emergency department for additional evaluation.  Patient also complains of increasing nosebleed.  Patient states that approximately 2 weeks ago she had a PT/INR done and it was a little over 6 INR.  She was told to hold her Coumadin for couple of days and then make an adjustment in her Coumadin schedule.  She says most recently the INR was down to 3.3.  She says however that she still notices blood on her pillow at night, and she has been passing some clots at times.  She has not seen her physician for these issues as her physician is approximately half hour drive away from her home.  The patient says she is not aware of any high fever.  She has not had any falls or recent injury to the lower extremity.  No recent operations or procedures of any kind.  She presents now for assistance with her pain and for evaluation of the discoloration of her  right leg.     Past Medical History:  Diagnosis Date   Anemia    Anxiety    Asthma    DVT (deep venous thrombosis) (HCC)    Gastric ulcer    Headache    Hiatal hernia    Hypertension    PE (pulmonary embolism)    Tachycardia     Patient Active Problem List   Diagnosis Date Noted   Encephalopathy    ARF (acute renal failure) ([place])    DVT (deep venous thrombosis) (HCC)    Anemia    Morbidly obese (HCC)    Abnormal CT scan, stomach    Leg DVT (deep venous thromboembolism), acute, right ([place]) 05/24/2019   Right leg pain 05/24/2019   Suspected Pulmonary embolus 05/24/2019   Community acquired pneumonia 05/24/2019   Positive D dimer 05/24/2019   Left lower lobe pneumonia ([place]) 05/24/2019   Hypotension 05/24/2019   Benign essential HTN    Uncomplicated asthma    Morbid Obesity     Adjustment disorder with mixed anxiety and depressed mood    Hyperglycemia    Leukocytosis    Acute blood loss anemia    Acute hypoxemic respiratory failure (HCC)    AKI (acute kidney injury) ([place])    Septic shock ([place]) 02/16/2017   Necrotizing soft tissue infection 02/16/2017  Generalized anxiety disorder 12/22/2014    Past Surgical History:  Procedure Laterality Date   CESAREAN SECTION     CHOLECYSTECTOMY     ESOPHAGOGASTRODUODENOSCOPY (EGD) WITH PROPOFOL N/A 05/25/2019   Procedure: ESOPHAGOGASTRODUODENOSCOPY (EGD) WITH PROPOFOL;  Surgeon: [name], MD;  Location: [facility] ENDOSCOPY;  Service: Endoscopy;  Laterality: N/A;   INCISION AND DRAINAGE ABSCESS Left 02/16/2017   Procedure: DEBRIDEMENT LEFT BUTTOCK ABSCESS;  Surgeon: Fanny Skates, MD;  Location: [place];  Service: General;  Laterality: Left;   IRRIGATION AND DEBRIDEMENT BUTTOCKS Left 02/19/2017   Procedure: DEBRIDEMENT OF GLUTEAL WOUND;  Surgeon: Rolm Bookbinder, MD;  Location: [place];  Service: General;  Laterality: Left;     OB History    Gravida  1   Para  1   Term  1   Preterm        AB      Living  1     SAB      TAB      Ectopic      Multiple      Live Births               Home Medications    Prior to Admission medications   Medication Sig Start Date End Date Taking? Authorizing Provider  acetaminophen (TYLENOL) 500 MG tablet Take 500 mg by mouth every 6 (six) hours as needed for moderate pain.    [provider]  albuterol (VENTOLIN HFA) 108 (90 BASE) MCG/ACT inhaler Inhale 2 puffs into the lungs every 6 (six) hours as needed for wheezing.    [provider]  diltiazem (TIAZAC) 180 MG 24 hr capsule Take 180 mg by mouth daily.    [provider]  esomeprazole (NEXIUM) 20 MG capsule Take 20 mg by mouth daily at 12 noon.    [provider]  ferrous sulfate 325 (65 FE) MG tablet Take 1 tablet (325 mg total) by mouth 2 (two) times daily with a meal. 06/03/19 08/02/19  Amin, Jeanella Flattery, MD  lisinopril (ZESTRIL) 20 MG tablet Take 20 mg by mouth daily.    [provider]  montelukast (SINGULAIR) 10 MG tablet Take 10 mg by mouth daily.    [provider]  nortriptyline (PAMELOR) 25 MG capsule Take 25 mg by mouth at bedtime.    [provider]  ondansetron (ZOFRAN) 4 MG tablet Take 1 tablet (4 mg total) by mouth every 8 (eight) hours as needed for nausea or vomiting. 03/09/17   Rai, Ripudeep K, MD  pantoprazole (PROTONIX) 40 MG tablet Take 1 tablet (40 mg total) by mouth 2 (two) times daily before a meal. 06/03/19 08/02/19  Amin, Jeanella Flattery, MD  sertraline (ZOLOFT) 100 MG tablet Take 1 tablet (100 mg total) by mouth 2 (two) times daily. 01/19/15   Cloria Spring, MD  topiramate (TOPAMAX) 50 MG tablet Take 50 mg by mouth daily. 01/24/17   [provider]  warfarin (COUMADIN) 10 MG tablet Take 1 tablet (10 mg total) by mouth daily at 6 PM for 30 doses. 06/03/19 07/03/19  Damita Lack, MD    Family History Family History  Problem Relation Age of Onset   Depression Mother    Alcohol  abuse Mother     Social History Social History   Tobacco Use   Smoking status: Former Smoker    Types: Cigarettes   Smokeless tobacco: Never Used   Tobacco comment: QUIT ABOUT 5 YEARS AGO  Substance Use Topics  Alcohol use: Yes    Comment: occa   Drug use: No     Allergies   Aspirin   Review of Systems Review of Systems  Constitutional: Negative for activity change and appetite change.  HENT: Positive for nosebleeds. Negative for congestion, ear discharge, ear pain, facial swelling, rhinorrhea, sneezing and tinnitus.   Eyes: Negative for photophobia, pain and discharge.  Respiratory: Negative for cough, choking and wheezing.   Cardiovascular: Negative for chest pain, palpitations and leg swelling.  Gastrointestinal: Negative for abdominal pain, blood in stool, constipation, diarrhea, nausea and vomiting.  Genitourinary: Negative for difficulty urinating, dysuria, flank pain, frequency and hematuria.  Musculoskeletal: Negative for back pain, gait problem, myalgias and neck pain.       Leg pain  Skin: Positive for wound. Negative for color change and rash.       Leg lesions  Neurological: Negative for dizziness, seizures, syncope, facial asymmetry, speech difficulty, weakness and numbness.  Hematological: Negative for adenopathy. Does not bruise/bleed easily.  Psychiatric/Behavioral: Negative for agitation, confusion, hallucinations, self-injury and suicidal ideas. The patient is not nervous/anxious.      Physical Exam Updated Vital Signs BP 123/83 (BP Location: Left Wrist)    Pulse 95    Temp 98.1 F (36.7 C) (Oral)    Resp (!) 26    Ht 5\' 5"  (1.651 m)    Wt (!) 181.4 kg    LMP 06/18/2019    SpO2 99%    BMI 66.56 kg/m   Physical Exam Vitals signs and nursing note reviewed.  Constitutional:      Appearance: She is well-developed. She is obese. She is not toxic-appearing.     Comments: Morbidly obese  HENT:     Head: Normocephalic.     Right Ear: Tympanic  membrane and external ear normal.     Left Ear: Tympanic membrane and external ear normal.  Eyes:     General: Lids are normal.     Pupils: Pupils are equal, round, and reactive to light.  Neck:     Musculoskeletal: Normal range of motion and neck supple.     Vascular: No carotid bruit.  Cardiovascular:     Rate and Rhythm: Normal rate and regular rhythm.     Pulses: Normal pulses.     Heart sounds: Normal heart sounds.  Pulmonary:     Effort: Tachypnea present. No respiratory distress.     Comments: Coarse breath sounds bilaterally.  Decreased breath sounds on the right compared to the left.  There is symmetrical rise and fall of the chest.  The patient speaks in complete sentences without problem. Abdominal:     General: Bowel sounds are normal.     Palpations: Abdomen is soft.     Tenderness: There is no abdominal tenderness. There is no guarding.  Musculoskeletal: Normal range of motion.     Comments: There are multiple varicose veins present. Patient has of multiple areas of increased redness of the right lower extremity from the anterior lateral tibial area to the mid thigh.  These areas show increased redness, tenderness, and some induration present.  These areas are warm to touch. 2 areas of the upper thigh are scabbed.  There is full range of motion of the toes and ankle on the right..  Patient has good range of motion of the knee, but says that this causes pain to increase.  Could not evaluate for inguinal lymph nodes due to body habitus. Dorsalis pedis pulses 2+.  Capillary refill is  less than 2 seconds.  Lymphadenopathy:     Head:     Right side of head: No submandibular adenopathy.     Left side of head: No submandibular adenopathy.     Cervical: No cervical adenopathy.  Skin:    General: Skin is warm and dry.  Neurological:     Mental Status: She is alert and oriented to person, place, and time.     Cranial Nerves: No cranial nerve deficit.     Sensory: No sensory  deficit.  Psychiatric:        Speech: Speech normal.          ED Treatments / Results  Labs (all labs ordered are listed, but only abnormal results are displayed) Labs Reviewed  CBC - Abnormal; Notable for the following components:      Result Value   RBC 3.32 (*)    Hemoglobin 8.6 (*)    HCT 30.0 (*)    MCH 25.9 (*)    MCHC 28.7 (*)    RDW 22.5 (*)    Platelets 482 (*)    All other components within normal limits  CULTURE, BLOOD (ROUTINE X 2)  CULTURE, BLOOD (ROUTINE X 2)  PROTIME-INR  LACTIC ACID, PLASMA  COMPREHENSIVE METABOLIC PANEL    EKG None  Radiology No results found.  Procedures Procedures (including critical care time)  Medications Ordered in ED Medications  HYDROmorphone (DILAUDID) injection 1 mg (has no administration in time range)  promethazine (PHENERGAN) tablet 12.5 mg (has no administration in time range)     Initial Impression / Assessment and Plan / ED Course  I have reviewed the triage vital signs and the nursing notes.  Pertinent labs & imaging results that were available during my care of the patient were reviewed by me and considered in my medical decision making (see chart for details).          Final Clinical Impressions(s) / ED Diagnoses Pt seen with me by Dr Eulis Foster. Patient has a tachypnea of 26 breaths/min, otherwise vital signs are within normal limits.  Pulse oximetry is 99% on room air.  Within normal limits by my interpretation. Patient has red to purple raised tender warm indurated areas of the right lower extremity. DDx: Cellulitis, infected thrombosis, necrotizing fasciitis.  Consult with Pharmacy for zosyn and vancomycin dosing.  The comprehensive metabolic panel is nonacute.  The anion gap is normal at 10.  The lactic acid is normal at 1.3.  Complete blood count shows the white blood cells to be normal at 9300.  The hemoglobin is low at 8.6 and hematocrit at 30.  This is in the range of the patient's usual  anemia readings.  The [facility] is low at 29.9, and the MCHC is low at 28.7.  The platelets are elevated at 482,000.  The PT shows the prothrombin time at 66.1, and the INR is elevated at 8.1.  Portable chest x-ray shows cardiomegaly with mild pulmonary vascular congestion suggesting mild congestive heart failure/fluid overload.  There is also streaky right basilar opacity that may represent atelectasis, and/or developing infection.  I have reviewed these x-rays.  COVID-19 test is negative. I discussed with the patient the need for more advanced imaging and management. Pt will need to be transferred to [facility] hospital as imaging for this patient is not available.   Pt request to be transferred to [facility]. Hospitalist notes no beds available, and waiting list would prevent admission at this time.  Case discussed with [facility]. They will arrange admission to [facility]. Pt in agreement with this plan.    Final diagnoses:  Cellulitis of right leg  Tachypnea  History of DVT of lower extremity    ED Discharge Orders    None       Lily Kocher, PA-C 07/19/19 1521    Daleen Bo, MD 07/19/19 2036

## 2019-07-19 NOTE — ED Notes (Signed)
Pt reports pain is 4/10 "after that shot"

## 2019-07-19 NOTE — ED Notes (Signed)
meds for report of pain 6/10  Pt reports she has to urinate  She refuses pure wick, reports she is unable to ambulate, and request catherization

## 2019-07-19 NOTE — ED Notes (Signed)
CRITICAL VALUE ALERT  Critical Value:  INR 8.1  Date & Time Notied:  1220 9/5  Provider Notified: bryant  Orders Received/Actions taken:

## 2019-07-19 NOTE — ED Notes (Signed)
Call to lab   Covid results in less than 10 minutes

## 2019-07-19 NOTE — ED Notes (Signed)
Call for report 

## 2019-07-19 NOTE — ED Notes (Signed)
Pt is morbidly obese  States R leg clot being treated with home health and coumadin  Will not share weight   Increasing pain in the last week- called physician in [place] who suggested coming in to be seen Pt states she could not tolerate an hours ride  Here for eval and for pain control

## 2019-07-19 NOTE — ED Notes (Signed)
Pt assisted to stretcher  Report Darcus Pester, RN

## 2019-07-19 NOTE — H&P (Addendum)
History and Physical    Megan Ortiz [id number] DOB: May 23, 1976 DOA: 07/19/2019  PCP: Larene Beach, MD  Patient coming from: Home  I have personally briefly reviewed patient's old medical records in [place]  Chief Complaint: Right leg pain  HPI: Megan Ortiz is a 43 y.o. female with medical history significant of morbid obesity, admission to the hospital in the last 2 months for extensive right lower extremity DVT, prior history of VTE, morbid obesity, history of necrotizing fasciitis, presents to the hospital with a 2-week history of increasing pain in the right lower extremity.  She reports that her lower extremity is edematous, but has overall improved since initial DVT was diagnosed 2 months ago.  She has developed areas of ecchymosis and severe tenderness in her right medial thigh and right medial calf, as well as right lateral thigh.  She has noticed that an area in the right medial thigh is developing skin breakdown and ulceration of the skin.  She has not had a fever, but is unable to walk due to severe pain.  She reports being compliant with her Coumadin.  She has had episodes of supratherapeutic INR recently and was told to hold her Coumadin.  She reports that most recent INR as an outpatient was noted to be around 3.  Prior to the past 2 weeks, she reports that her INR is fairly stable, around 2-3 range.  She is not had any shortness of breath or cough.  No vomiting or diarrhea.  He has been experiencing intermittent episodes of epistaxis, currently not occurring.  ED Course: She was evaluated in the emergency room where she was noted to be hemodynamically stable.  White blood cell count was normal lactic acid was normal.  INR was noted to be markedly increased at 8.  There was concern that she may have a deep tissue infection which needs further evaluation.  Due to her weight and body habitus, she would not be able to undergo evaluation in hospital and will be  transferred and will discuss further evaluation.  Review of Systems:  General: Negative for fever, malaise Respiratory: Negative for shortness of breath, cough Cardiac: Negative for chest pain, palpitations GI: Negative for vomiting, diarrhea, abdominal pain Skin: Positive for ulceration of the right thigh, tender ecchymosis in her lower extremity All other systems reviewed and found to be negative   Past Medical History:  Diagnosis Date   Anemia    Anxiety    Asthma    DVT (deep venous thrombosis) (HCC)    Gastric ulcer    Headache    Hiatal hernia    Hypertension    PE (pulmonary embolism)    Tachycardia     Past Surgical History:  Procedure Laterality Date   CESAREAN SECTION     CHOLECYSTECTOMY     ESOPHAGOGASTRODUODENOSCOPY (EGD) WITH PROPOFOL N/A 05/25/2019   Procedure: ESOPHAGOGASTRODUODENOSCOPY (EGD) WITH PROPOFOL;  Surgeon: [name], MD;  Location: [facility] ENDOSCOPY;  Service: Endoscopy;  Laterality: N/A;   INCISION AND DRAINAGE ABSCESS Left 02/16/2017   Procedure: DEBRIDEMENT LEFT BUTTOCK ABSCESS;  Surgeon: Fanny Skates, MD;  Location: [place];  Service: General;  Laterality: Left;   IRRIGATION AND DEBRIDEMENT BUTTOCKS Left 02/19/2017   Procedure: DEBRIDEMENT OF GLUTEAL WOUND;  Surgeon: Rolm Bookbinder, MD;  Location: [place];  Service: General;  Laterality: Left;    Social History:  reports that she has quit smoking. Her smoking use included cigarettes. She has never used smokeless tobacco. She reports current alcohol  use. She reports that she does not use drugs.  Allergies  Allergen Reactions   Aspirin     Due to hx of stomach ulcers    Family History  Problem Relation Age of Onset   Depression Mother    Alcohol abuse Mother     Prior to Admission medications   Medication Sig Start Date End Date Taking? Authorizing Provider  acetaminophen (TYLENOL) 500 MG tablet Take 500 mg by mouth every 6 (six) hours as needed for moderate pain.   Yes  [provider]  albuterol (VENTOLIN HFA) 108 (90 BASE) MCG/ACT inhaler Inhale 2 puffs into the lungs every 6 (six) hours as needed for wheezing.   Yes [provider]  diltiazem (TIAZAC) 180 MG 24 hr capsule Take 180 mg by mouth daily.   Yes [provider]  esomeprazole (NEXIUM) 20 MG capsule Take 20 mg by mouth daily at 12 noon.   Yes [provider]  ferrous sulfate 325 (65 FE) MG tablet Take 1 tablet (325 mg total) by mouth 2 (two) times daily with a meal. 06/03/19 08/02/19 Yes Amin, Ankit Chirag, MD  lisinopril (ZESTRIL) 20 MG tablet Take 20 mg by mouth daily.   Yes [provider]  montelukast (SINGULAIR) 10 MG tablet Take 10 mg by mouth daily.   Yes [provider]  nortriptyline (PAMELOR) 25 MG capsule Take 25 mg by mouth at bedtime.   Yes [provider]  ondansetron (ZOFRAN) 4 MG tablet Take 1 tablet (4 mg total) by mouth every 8 (eight) hours as needed for nausea or vomiting. 03/09/17  Yes Rai, Ripudeep K, MD  pantoprazole (PROTONIX) 40 MG tablet Take 1 tablet (40 mg total) by mouth 2 (two) times daily before a meal. 06/03/19 08/02/19 Yes Amin, Ankit Chirag, MD  sertraline (ZOLOFT) 100 MG tablet Take 1 tablet (100 mg total) by mouth 2 (two) times daily. 01/19/15  Yes Cloria Spring, MD  topiramate (TOPAMAX) 50 MG tablet Take 50 mg by mouth daily. 01/24/17  Yes [provider]  warfarin (COUMADIN) 10 MG tablet Take 1 tablet (10 mg total) by mouth daily at 6 PM for 30 doses. Patient taking differently: Take 10 mg by mouth daily at 6 PM. On Monday ,Wednesday and Friday and 5 mg on Tuesday Thursday, Saturday and Sunday 06/03/19 07/19/19 Yes Damita Lack, MD    Physical Exam: Vitals:   07/19/19 1730 07/19/19 1745 07/19/19 1800 07/19/19 1915  BP: 103/69  124/83   Pulse: 99 (!) 106 (!) 101   Resp:      Temp:    99.8 F (37.7 C)  TempSrc:    Oral  SpO2: 91% 91% 91%   Weight:      Height:        Constitutional: NAD,  calm, comfortable, morbidly obese Eyes: PERRL, lids and conjunctivae normal ENMT: Mucous membranes are moist. Posterior pharynx clear of any exudate or lesions.Normal dentition.  Neck: normal, supple, no masses, no thyromegaly Respiratory: clear to auscultation bilaterally, no wheezing, no crackles. Normal respiratory effort. No accessory muscle use.  Cardiovascular: Regular rate and rhythm, no murmurs / rubs / gallops. 2+ pedal pulses. No carotid bruits.  Abdomen: no tenderness, no masses palpated. No hepatosplenomegaly. Bowel sounds positive.  Musculoskeletal: no clubbing / cyanosis.  Right lower extremity is substantially more edematous than left Skin: Areas of ecchymosis on right medial thigh and right medial calf with underlying induration and warmth.  These areas are extremely tender to touch.  Small ulceration noted in ecchymosis around medial thigh Neurologic: CN 2-12 grossly intact. Sensation intact, DTR normal. Strength 5/5 in all 4.  Psychiatric: Normal judgment and insight. Alert and oriented x 3. Normal mood.    Labs on Admission: I have personally reviewed following labs and imaging studies  CBC: Recent Labs  Lab 07/19/19 1124  WBC 9.3  HGB 8.6*  HCT 30.0*  MCV 90.4  PLT 123XX123*   Basic Metabolic Panel: Recent Labs  Lab 07/19/19 1153  NA 135  K 4.1  CL 101  CO2 24  GLUCOSE 103*  BUN 11  CREATININE 0.62  CALCIUM 9.1   GFR: Estimated Creatinine Clearance: 190.7 mL/min (by C-G formula based on SCr of 0.62 mg/dL). Liver Function Tests: Recent Labs  Lab 07/19/19 1153  AST 11*  ALT 10  ALKPHOS 63  BILITOT 0.1*  PROT 8.5*  ALBUMIN 3.3*   No results for input(s): LIPASE, AMYLASE in the last 168 hours. No results for input(s): AMMONIA in the last 168 hours. Coagulation Profile: Recent Labs  Lab 07/19/19 1124  INR 8.1*   Cardiac Enzymes: No results for input(s): CKTOTAL, CKMB, CKMBINDEX, TROPONINI in the last 168 hours. BNP (last 3 results) No results  for input(s): PROBNP in the last 8760 hours. HbA1C: No results for input(s): HGBA1C in the last 72 hours. CBG: No results for input(s): GLUCAP in the last 168 hours. Lipid Profile: No results for input(s): CHOL, HDL, LDLCALC, TRIG, CHOLHDL, LDLDIRECT in the last 72 hours. Thyroid Function Tests: No results for input(s): TSH, T4TOTAL, FREET4, T3FREE, THYROIDAB in the last 72 hours. Anemia Panel: No results for input(s): VITAMINB12, FOLATE, FERRITIN, TIBC, IRON, RETICCTPCT in the last 72 hours. Urine analysis:    Component Value Date/Time   COLORURINE AMBER (A) 05/27/2019 1300   APPEARANCEUR CLOUDY (A) 05/27/2019 1300   LABSPEC 1.032 (H) 05/27/2019 1300   PHURINE 5.0 05/27/2019 1300   GLUCOSEU NEGATIVE 05/27/2019 1300   HGBUR NEGATIVE 05/27/2019 1300   BILIRUBINUR NEGATIVE 05/27/2019 1300   KETONESUR 5 (A) 05/27/2019 1300   PROTEINUR NEGATIVE 05/27/2019 1300   UROBILINOGEN 0.2 05/11/2013 1903   NITRITE NEGATIVE 05/27/2019 1300   LEUKOCYTESUR NEGATIVE 05/27/2019 1300    Radiological Exams on Admission: Dg Chest Portable 1 View  Result Date: 07/19/2019 CLINICAL DATA:  Tachypnea EXAM: PORTABLE CHEST 1 VIEW COMPARISON:  05/25/2019 FINDINGS: Heart size appears enlarged. Lung volumes are low. Streaky right basilar opacity. There is mild pulmonary vascular congestion. No pneumothorax. IMPRESSION: 1. Cardiomegaly with mild pulmonary vascular congestion suggesting mild CHF/fluid overload. 2. Streaky right basilar opacity may reflect atelectasis and/or developing infection in the appropriate clinical setting. Electronically Signed   By: Davina Poke M.D.   On: 07/19/2019 12:33    Assessment/Plan Principal Problem:   Cellulitis Active Problems:   Benign essential HTN   Morbid Obesity    DVT (deep venous thrombosis) (HCC)   Anemia   Supratherapeutic INR   GERD (gastroesophageal reflux disease)     1. Cellulitis.  Patient presents with painful areas in her right lower extremity  with overlying ecchymosis and ulceration.  Concern for deep tissue infection.  This will need to be further evaluated by CT imaging.  She will be transferred to [facility] for further evaluation.  If there is any underlying deep infection, further consultation with surgery may be needed.  She has been started on broad-spectrum antibiotics for now.  She has been on Coumadin for the last 2 months, making warfarin associated necrosis less  likely.  If CT does not show any significant deep infection, may need to pursue further vascular work-up with repeat venous Dopplers and potential vascular surgery input. 2. Recent right lower extremity DVT.  Diagnosed in 05/2019.  She has been on Coumadin and INR is currently supratherapeutic.  Hold further Coumadin.  If she develops any signs of bleeding, will need to consider reversal. 3. Anemia.  Chronic.  Appears to be consistent with prior values. 4. GERD.  Continue PPI 5. Hypertension.  Continue with diltiazem and lisinopril. 6. Morbid obesity.  BMI of 92.  DVT prophylaxis: Coumadin Code Status: Full code Family Communication: Discussed with the spouse at the bedside Disposition Plan: Transfer to [facility] for further work-up Consults called:   Admission status: Inpatient, telemetry  Kathie Dike MD [facility]   If 7PM-7AM, please contact night-coverage [email or web address]   07/19/2019, 7:28 PM

## 2019-07-19 NOTE — Progress Notes (Signed)
ANTICOAGULATION CONSULT NOTE - Initial Up Consult   Pharmacy Consult for warfarin dosing  Indication: DVT   Allergies  Allergen Reactions  . Aspirin     Due to hx of stomach ulcers      Patient Measurements: Last Weight  Most recent update: 07/19/2019  1:50 PM   Weight  251 kg (553 lb 5.7 oz)             Body mass index is 92.08 kg/m. Jorja Loa               Temp: 98.1 F (36.7 C) (09/05 1101) Temp Source: Oral (09/05 1101) BP: 124/83 (09/05 1800) Pulse Rate: 101 (09/05 1800)  Labs: Recent Labs    07/19/19 1124 07/19/19 1153  HGB 8.6*  --   HCT 30.0*  --   PLT 482*  --   APTT 176*  --   LABPROT 66.1*  --   INR 8.1*  --   CREATININE  --  0.62    Estimated Creatinine Clearance: 190.7 mL/min (by C-G formula based on SCr of 0.62 mg/dL).     Medications:  (Not in a hospital admission)  Scheduled:   Infusions:  . piperacillin-tazobactam (ZOSYN)  IV    . vancomycin (VANCOCIN) 1500 mg/51mL IVPB (Vial-Mate [employer]or)     PRN:  Anti-infectives (From admission, onward)   Start     Dose/Rate Route Frequency Ordered Stop   07/19/19 2200  Vancomycin (VANCOCIN) 1,500 mg in sodium chloride 0.9 % 500 mL IVPB     1,500 mg 250 mL/hr over 120 Minutes Intravenous Every 8 hours 07/19/19 1846     07/19/19 2200  piperacillin-tazobactam (ZOSYN) IVPB 3.375 g     3.375 g 12.5 mL/hr over 240 Minutes Intravenous Every 8 hours 07/19/19 1846     07/19/19 1230  vancomycin (VANCOCIN) IVPB 1000 mg/200 mL premix     1,000 mg 200 mL/hr over 60 Minutes Intravenous Every 1 hr x 2 07/19/19 1215 07/19/19 1523   07/19/19 1230  piperacillin-tazobactam (ZOSYN) IVPB 3.375 g     3.375 g 12.5 mL/hr over 240 Minutes Intravenous  Once 07/19/19 1215 07/19/19 1323      Goal of Therapy:  INR 2-3 Monitor platelets by anticoagulation protocol: Yes    Prior to Admission Warfarin Dosing:     Monday:10mg   Tuesday:5mg   Wednesday:10mg   Thursday:5mg   Friday:10mg   Saturday:5mg   Sunday:5mg    Admit INR was 8.1 Lab Results  Component Value Date   INR 8.1 (HH) 07/19/2019   INR 1.8 (H) 06/03/2019   INR 2.0 (H) 06/02/2019    Assessment: Jaraya Rasbury a 43 y.o. female requires anticoagulation with warfarin for the indication of  DVT. Warfarin will be initiated inpatient following pharmacy protocol per pharmacy consult. Patient most recent blood work is as follows: CBC Latest Ref Rng & Units 07/19/2019 06/03/2019 06/02/2019  WBC 4.0 - 10.5 K/uL 9.3 12.8(H) 12.4(H)  Hemoglobin 12.0 - 15.0 g/dL 8.6(L) 8.5(L) 8.2(L)  Hematocrit 36.0 - 46.0 % 30.0(L) 29.9(L) 27.5(L)  Platelets 150 - 400 K/uL 482(H) 378 338     Plan: Hold warfarin  Monitor CBC daily with am labs   Monitor INR daily Monitor for signs and symptoms of bleeding   [name] Beverly Ferner, PharmD, MBA, BCGP Clinical Pharmacist

## 2019-07-19 NOTE — ED Notes (Signed)
Report to [place], [place]

## 2019-07-19 NOTE — ED Triage Notes (Signed)
Patient c/o "hard knots with warmth, redness, and tenderness in right leg  x2-3 weeks. Denies any fevers. Patient states blood clots in right leg. Patient currently taking coumadin. Patient also c/o nose bleed x3 days with clots. Per patient bleeding from right nare. Hx of anemia.

## 2019-07-19 NOTE — ED Notes (Signed)
Continues to await transport 

## 2019-07-19 NOTE — Progress Notes (Signed)
Pharmacy Antibiotic Note  Megan Ortiz is a 43 y.o. female admitted on 07/19/2019 with cellulitis.  Pharmacy has been consulted for vancomycin and zosyn dosing.   Plan: Vancomycin 1500mg  IV every 8 hours.  Goal trough 10-15 mcg/mL. Zosyn 3.375g IV q8h (4 hour infusion).  Height: 5\' 5"  (165.1 cm) Weight: (!) 553 lb 5.7 oz (251 kg) IBW/kg (Calculated) : 57  Temp (24hrs), Avg:98.1 F (36.7 C), Min:98.1 F (36.7 C), Max:98.1 F (36.7 C)  Recent Labs  Lab 07/19/19 1124 07/19/19 1152 07/19/19 1153  WBC 9.3  --   --   CREATININE  --   --  0.62  LATICACIDVEN  --  1.3  --     Estimated Creatinine Clearance: 190.7 mL/min (by C-G formula based on SCr of 0.62 mg/dL).    Allergies  Allergen Reactions  . Aspirin     Due to hx of stomach ulcers    Antimicrobials this admission: 9/5 vancomycin >>  9/5 zosyn >>   Microbiology results: 9/5 BCx: sent  9/5 Covid negative   Thank you for allowing pharmacy to be a part of this patient's care.  [name] Kareli Hossain 07/19/2019 6:50 PM

## 2019-07-19 NOTE — ED Notes (Signed)
HB in to assess 

## 2019-07-19 NOTE — ED Notes (Signed)
Call to Bed Control  Pt will be transported around 8 pm (4 in front )

## 2019-07-19 NOTE — ED Notes (Signed)
Pt reports she does not believe she is able to stand and pivot to [facility] stretcher due to being on ED stretcher for many hours and now is cramping due to same position and feels unsafe attempting to transfer that way   Rip Harbour, RN, CN, informed and Garry Heater, USec will notify security, as well for transfer assist

## 2019-07-19 NOTE — ED Provider Notes (Signed)
  Face-to-face evaluation   History: She complains of right leg pain and swelling, with "ridges," for 2 weeks.  She denies nausea, vomiting, focal weakness or paresthesia.  Physical exam: Morbidly obese, alert and cooperative.  No respiratory distress.  Right leg with redness, induration, excoriations, and tenderness over these areas.  The clinical findings consistent with cellulitis, possibly coalescing, and I am unable to rule out deep tissue infection.  These could potentially be septic thrombophlebitis findings.  Medical screening examination/treatment/procedure(s) were conducted as a shared visit with non-physician practitioner(s) and myself.  I personally evaluated the patient during the encounter   Daleen Bo, MD 07/19/19 2036

## 2019-07-19 NOTE — ED Notes (Signed)
continues to await transport

## 2019-07-19 NOTE — ED Notes (Signed)
HB in to reassess and discuss findings

## 2019-07-20 ENCOUNTER — Inpatient Hospital Stay (HOSPITAL_COMMUNITY): Payer: Self-pay

## 2019-07-20 LAB — BASIC METABOLIC PANEL
Anion gap: 12 (ref 5–15)
BUN: 13 mg/dL (ref 6–20)
CO2: 21 mmol/L — ABNORMAL LOW (ref 22–32)
Calcium: 9.3 mg/dL (ref 8.9–10.3)
Chloride: 101 mmol/L (ref 98–111)
Creatinine, Ser: 0.72 mg/dL (ref 0.44–1.00)
GFR calc Af Amer: >60 mL/min (ref >60)
GFR calc non Af Amer: >60 mL/min (ref >60)
Glucose, Bld: 97 mg/dL (ref 70–99)
Potassium: 3.8 mmol/L (ref 3.5–5.1)
Sodium: 134 mmol/L — ABNORMAL LOW (ref 135–145)

## 2019-07-20 LAB — CBC
HCT: 27.8 % — ABNORMAL LOW (ref 36.0–46.0)
Hemoglobin: 8.3 g/dL — ABNORMAL LOW (ref 12.0–15.0)
MCH: 25.7 pg — ABNORMAL LOW (ref 26.0–34.0)
MCHC: 29.9 g/dL — ABNORMAL LOW (ref 30.0–36.0)
MCV: 86.1 fL (ref 80.0–100.0)
Platelets: 433 10*3/uL — ABNORMAL HIGH (ref 150–400)
RBC: 3.23 MIL/uL — ABNORMAL LOW (ref 3.87–5.11)
RDW: 22.4 % — ABNORMAL HIGH (ref 11.5–15.5)
WBC: 7.7 10*3/uL (ref 4.0–10.5)
nRBC: 0 % (ref 0.0–0.2)

## 2019-07-20 LAB — PROTIME-INR
INR: 8.3 (ref 0.8–1.2)
Prothrombin Time: 67.4 seconds — ABNORMAL HIGH (ref 11.4–15.2)

## 2019-07-20 MED ORDER — WARFARIN - PHARMACIST DOSING INPATIENT: Freq: Every day | Status: DC

## 2019-07-20 MED ORDER — OXYCODONE HCL 5 MG PO TABS
5.0000 mg | ORAL_TABLET | ORAL | Status: DC | PRN
  Administered 2019-07-21 (×5): 10 mg via ORAL
  Administered 2019-07-22: 5 mg via ORAL
  Administered 2019-07-22: 10 mg via ORAL
  Administered 2019-07-22 – 2019-07-24 (×6): 5 mg via ORAL
  Administered 2019-07-24: 10 mg via ORAL
  Administered 2019-07-24: 5 mg via ORAL
  Administered 2019-07-24: 10 mg via ORAL
  Administered 2019-07-24 – 2019-07-25 (×2): 5 mg via ORAL
  Administered 2019-07-25: 10 mg via ORAL
  Administered 2019-07-26 – 2019-07-27 (×2): 5 mg via ORAL
  Administered 2019-07-28 – 2019-08-03 (×16): 10 mg via ORAL
  Administered 2019-08-03: 5 mg via ORAL
  Administered 2019-08-04 – 2019-08-06 (×5): 10 mg via ORAL
  Filled 2019-07-20: qty 1
  Filled 2019-07-20 (×8): qty 2
  Filled 2019-07-20 (×2): qty 1
  Filled 2019-07-20 (×7): qty 2
  Filled 2019-07-20: qty 1
  Filled 2019-07-20 (×3): qty 2
  Filled 2019-07-20: qty 1
  Filled 2019-07-20 (×3): qty 2
  Filled 2019-07-20: qty 1
  Filled 2019-07-20 (×3): qty 2
  Filled 2019-07-20: qty 1
  Filled 2019-07-20 (×5): qty 2
  Filled 2019-07-20 (×2): qty 1
  Filled 2019-07-20: qty 2
  Filled 2019-07-20 (×2): qty 1
  Filled 2019-07-20: qty 2
  Filled 2019-07-20: qty 1
  Filled 2019-07-20 (×3): qty 2

## 2019-07-20 MED ORDER — VANCOMYCIN HCL 10 G IV SOLR
1500.0000 mg | Freq: Three times a day (TID) | INTRAVENOUS | Status: DC
  Administered 2019-07-20 – 2019-07-22 (×6): 1500 mg via INTRAVENOUS
  Filled 2019-07-20 (×10): qty 1500

## 2019-07-20 MED ORDER — SODIUM CHLORIDE 0.9% FLUSH: 10.0000 mL | INTRAVENOUS | Status: DC | PRN

## 2019-07-20 MED ORDER — MORPHINE SULFATE (PF) 2 MG/ML IV SOLN
1.0000 mg | Freq: Once | INTRAVENOUS | Status: AC
  Administered 2019-07-21: 1 mg via INTRAVENOUS
  Filled 2019-07-20: qty 1

## 2019-07-20 NOTE — Progress Notes (Signed)
Patient is experiencing pain at IV site on the right [facility], nurse saline locked pt, put another order for new IV since pt is a hard stick around 2300, IV antibiotic Vancomycin & zosyn stopped, waiting for IV team  so I can resume patients IV antibiotics, will continue to monitor.

## 2019-07-20 NOTE — Progress Notes (Signed)
MD notified of low BP advised to hold BP meds and to continue to monitor pt's BP. Pt is asymptomatic of low BP. Will continue to monitor.

## 2019-07-20 NOTE — Progress Notes (Addendum)
CRITICAL VALUE STICKER  CRITICAL VALUE: INR 8.1  DATE & TIME NOTIFIED: 0945 07/20/2019  MD NOTIFIED: YES  TIME OF NOTIFICATION: [id number]  Awaiting orders from MD  [phone] MD stated to continue to monitor for bleeding for pt. No bleeding as of now. Will continue to monitor.

## 2019-07-20 NOTE — Plan of Care (Signed)
  Problem: Education: Goal: Knowledge of General Education information will improve Description Including pain rating scale, medication(s)/side effects and non-pharmacologic comfort measures Outcome: Progressing   

## 2019-07-20 NOTE — Progress Notes (Signed)
ANTICOAGULATION CONSULT NOTE - Follow Up Consult  Pharmacy Consult for warfarin Indication: DVT (diagnosed 05/2019)  Allergies  Allergen Reactions  . Aspirin     Due to hx of stomach ulcers    Patient Measurements: Height: 5\' 5"  (165.1 cm) Weight: (!) 553 lb 5.7 oz (251 kg) IBW/kg (Calculated) : 57 HEPARIN DW (KG): 104.3  Vital Signs: Temp: 98.2 F (36.8 C) (09/06 0349) Temp Source: Oral (09/06 0349) BP: 99/41 (09/06 0857) Pulse Rate: 88 (09/06 0857)  Labs: Recent Labs    07/19/19 1124 07/19/19 1153 07/20/19 0533 07/20/19 0753  HGB 8.6*  --  8.3*  --   HCT 30.0*  --  27.8*  --   PLT 482*  --  433*  --   APTT 176*  --   --   --   LABPROT 66.1*  --   --  67.4*  INR 8.1*  --   --  8.3*  CREATININE  --  0.62 0.72  --     Estimated Creatinine Clearance: 190.7 mL/min (by C-G formula based on SCr of 0.72 mg/dL).   Assessment: 43 year old with severe morbid obesity and history of DVT diagnosed in July 2020, on PTA warfarin (home dosing regimen 10mg  MWF and 5mg  all other days) admitted for lower extremity cellulitis and evaluation of DVT. In addition to pain in leg, patient reported increasing nosebleeds (including passing clots) at home. She has a history of supratherapeutic INRs (reports INR was 6 about 2 weeks ago) and most recently the INR was down to 3.3. INR on admission 9/5 was 8.1 and warfarin has been held. Pharmacy has been consulted to monitor and dose warfarin.  Today, INR still supratherapeutic at 8.3. Hgb low but stable from previous at 8.3, platelets 433. No bleeding reported by RN. Will continue to hold warfarin and monitor for signs of bleeding.  Goal of Therapy:  INR 2-3 Monitor platelets by anticoagulation protocol: Yes   Plan:  Continue to hold warfarin  Monitor CBC daily with am labs   Monitor INR daily Monitor for signs and symptoms of bleeding   Brendolyn Patty, PharmD PGY2 Pharmacy Resident Phone [phone]  07/20/2019   10:02 AM

## 2019-07-20 NOTE — Progress Notes (Signed)
Pt ambulated  to [facility]. Had 10/10 pain in right leg. Receiving only norco for pain, however she is getting too much tylenol according to [facility]. Hospitalist on call notified. Patent is asking for stronger and quicker analgesic.

## 2019-07-20 NOTE — Progress Notes (Signed)
Area on rt cheek of buttock that pt states was there before being admitting. Area is about a size of a penny. No drainage noted. Wound consult order entered. Will continue to monitor pt.

## 2019-07-20 NOTE — Progress Notes (Signed)
PROGRESS NOTE    Megan Ortiz  [id number] DOB: 20-Aug-1976 DOA: 07/19/2019 PCP: Larene Beach, MD    Brief Narrative:  43 year old female with morbid obesity, recent right lower extremity DVT, presents to the hospital with complaints of worsening pain in her right lower extremity.  She was noted to have several areas of ecchymosis, induration and tenderness on the medial aspect of her right lower extremity.  Ultrasound imaging did not show any underlying fluid collections.  She is on IV antibiotics.   Assessment & Plan:   Principal Problem:   Cellulitis Active Problems:   Benign essential HTN   Morbid Obesity    DVT (deep venous thrombosis) (HCC)   Anemia   Supratherapeutic INR   GERD (gastroesophageal reflux disease)   1. Cellulitis.  Patient presents with painful areas in her right lower extremity with overlying ecchymosis and ulceration.  Initially, there was concern for possible deep tissue infection.  She was transferred to [facility] for further CT evaluation.  After patient was assessed by CT team, it was felt that her body habitus would not allow for adequate imaging of her lower extremity.    She was subsequently evaluated by ultrasound that did not show any fluid collections.  She has been on Coumadin for the last 2 months, making warfarin associated necrosis less likely.   She is currently on IV antibiotics and has mild improvement today.  Areas of induration are less firm.  It does not appear that her skin findings are worsening.  I suspect that she may have some degree of thrombophlebitis.  Continue antibiotics, warm compresses. 2. Recent right lower extremity DVT.  Diagnosed in 05/2019.  She has been on Coumadin and INR is currently supratherapeutic.  Hold further Coumadin.  If she develops any signs of bleeding, will need to consider reversal. 3. Anemia.  Chronic.  Appears to be consistent with prior values. 4. GERD.  Continue PPI 5. Hypertension.  Continue with  diltiazem and lisinopril. 6. Morbid obesity.  BMI of 92.   DVT prophylaxis: Coumadin Code Status: Full code Family Communication: None Disposition Plan: Discharge home once improved   Consultants:     Procedures:     Antimicrobials:   Vancomycin 9/5>  Zosyn 9/5 >   Subjective: Has continued pain in her right lower leg.  Unable to walk because of it.  No shortness of breath.  Objective: Vitals:   07/20/19 0831 07/20/19 0857 07/20/19 1051 07/20/19 1632  BP: (!) 98/50 (!) 99/41 (!) 123/53 (!) 93/46  Pulse: 91 88 87 96  Resp:   20 18  Temp:   97.8 F (36.6 C) 98.7 F (37.1 C)  TempSrc:   Oral Oral  SpO2:   93% 96%  Weight:      Height:        Intake/Output Summary (Last 24 hours) at 07/20/2019 1854 Last data filed at 07/20/2019 1551 Gross per 24 hour  Intake 1452.01 ml  Output 337 ml  Net 1115.01 ml   Filed Weights   07/19/19 1102 07/19/19 1125 07/19/19 1350  Weight: (!) 181.4 kg (!) 181.4 kg (!) 251 kg    Examination:  General exam: Appears calm and comfortable  Respiratory system: Clear to auscultation. Respiratory effort normal. Cardiovascular system: S1 & S2 heard, RRR. No JVD, murmurs, rubs, gallops or clicks. No pedal edema. Gastrointestinal system: Abdomen is nondistended, soft and nontender. No organomegaly or masses felt. Normal bowel sounds heard. Central nervous system: Alert and oriented. No focal neurological deficits. Extremities:  Symmetric 5 x 5 power. Skin: Areas of ecchymosis and induration medial right calf and medial right thigh appear to be less firm today and less warm. Psychiatry: Judgement and insight appear normal. Mood & affect appropriate.     Data Reviewed: I have personally reviewed following labs and imaging studies  CBC: Recent Labs  Lab 07/19/19 1124 07/20/19 0533  WBC 9.3 7.7  HGB 8.6* 8.3*  HCT 30.0* 27.8*  MCV 90.4 86.1  PLT 482* [id number]*   Basic Metabolic Panel: Recent Labs  Lab 07/19/19 1153 07/20/19 0533   NA 135 134*  K 4.1 3.8  CL 101 101  CO2 24 21*  GLUCOSE 103* 97  BUN 11 13  CREATININE 0.62 0.72  CALCIUM 9.1 9.3   GFR: Estimated Creatinine Clearance: 190.7 mL/min (by C-G formula based on SCr of 0.72 mg/dL). Liver Function Tests: Recent Labs  Lab 07/19/19 1153  AST 11*  ALT 10  ALKPHOS 63  BILITOT 0.1*  PROT 8.5*  ALBUMIN 3.3*   No results for input(s): LIPASE, AMYLASE in the last 168 hours. No results for input(s): AMMONIA in the last 168 hours. Coagulation Profile: Recent Labs  Lab 07/19/19 1124 07/20/19 0753  INR 8.1* 8.3*   Cardiac Enzymes: No results for input(s): CKTOTAL, CKMB, CKMBINDEX, TROPONINI in the last 168 hours. BNP (last 3 results) No results for input(s): PROBNP in the last 8760 hours. HbA1C: No results for input(s): HGBA1C in the last 72 hours. CBG: No results for input(s): GLUCAP in the last 168 hours. Lipid Profile: No results for input(s): CHOL, HDL, LDLCALC, TRIG, CHOLHDL, LDLDIRECT in the last 72 hours. Thyroid Function Tests: No results for input(s): TSH, T4TOTAL, FREET4, T3FREE, THYROIDAB in the last 72 hours. Anemia Panel: No results for input(s): VITAMINB12, FOLATE, FERRITIN, TIBC, IRON, RETICCTPCT in the last 72 hours. Sepsis Labs: Recent Labs  Lab 07/19/19 1152  LATICACIDVEN 1.3    Recent Results (from the past 240 hour(s))  Blood culture (routine x 2)     Status: None (Preliminary result)   Collection Time: 07/19/19 11:52 AM   Specimen: Right Antecubital; Blood  Result Value Ref Range Status   Specimen Description   Final    RIGHT ANTECUBITAL BOTTLES DRAWN AEROBIC AND ANAEROBIC   Special Requests Blood Culture adequate volume  Final   Culture   Final    NO GROWTH < 24 HOURS Performed at [facility], [address], [place], [place] [address]    Report Status PENDING  Incomplete  Blood culture (routine x 2)     Status: None (Preliminary result)   Collection Time: 07/19/19 12:27 PM   Specimen: Left Antecubital; Blood   Result Value Ref Range Status   Specimen Description   Final    LEFT ANTECUBITAL BOTTLES DRAWN AEROBIC AND ANAEROBIC   Special Requests Blood Culture adequate volume  Final   Culture   Final    NO GROWTH < 24 HOURS Performed at [facility], [address] [place], [place] [address]    Report Status PENDING  Incomplete  SARS Coronavirus 2 [facility] order, Performed in [place] hospital lab) Nasopharyngeal Nasopharyngeal Swab     Status: None   Collection Time: 07/19/19 12:50 PM   Specimen: Nasopharyngeal Swab  Result Value Ref Range Status   SARS Coronavirus 2 NEGATIVE NEGATIVE Final    Comment: (NOTE) If result is NEGATIVE SARS-CoV-2 target nucleic acids are NOT DETECTED. The SARS-CoV-2 RNA is generally detectable in upper and lower  respiratory specimens during the acute  phase of infection. The lowest  concentration of SARS-CoV-2 viral copies this assay can detect is 250  copies / mL. A negative result does not preclude SARS-CoV-2 infection  and should not be used as the sole basis for treatment or other  patient management decisions.  A negative result may occur with  improper specimen collection / handling, submission of specimen other  than nasopharyngeal swab, presence of viral mutation(s) within the  areas targeted by this assay, and inadequate number of viral copies  (<250 copies / mL). A negative result must be combined with clinical  observations, patient history, and epidemiological information. If result is POSITIVE SARS-CoV-2 target nucleic acids are DETECTED. The SARS-CoV-2 RNA is generally detectable in upper and lower  respiratory specimens dur ing the acute phase of infection.  Positive  results are indicative of active infection with SARS-CoV-2.  Clinical  correlation with patient history and other diagnostic information is  necessary to determine patient infection status.  Positive results do  not rule out bacterial infection or co-infection with other  viruses. If result is PRESUMPTIVE POSTIVE SARS-CoV-2 nucleic acids MAY BE PRESENT.   A presumptive positive result was obtained on the submitted specimen  and confirmed on repeat testing.  While 2019 novel coronavirus  (SARS-CoV-2) nucleic acids may be present in the submitted sample  additional confirmatory testing may be necessary for epidemiological  and / or clinical management purposes  to differentiate between  SARS-CoV-2 and other Sarbecovirus currently known to infect humans.  If clinically indicated additional testing with an alternate test  methodology [phone]) is advised. The SARS-CoV-2 RNA is generally  detectable in upper and lower respiratory sp ecimens during the acute  phase of infection. The expected result is Negative. Fact Sheet for Patients:  StrictlyIdeas.no Fact Sheet for Healthcare Providers: BankingDealers.co.za This test is not yet approved or cleared by the [place] FDA and has been authorized for detection and/or diagnosis of SARS-CoV-2 by FDA under an Emergency Use Authorization (EUA).  This EUA will remain in effect (meaning this test can be used) for the duration of the COVID-19 declaration under Section 564(b)(1) of the Act, 21 U.S.C. section 360bbb-3(b)(1), unless the authorization is terminated or revoked sooner. Performed at [facility], [address], [place], [place] [address]          Radiology Studies: Dg Chest Portable 1 View  Result Date: 07/19/2019 CLINICAL DATA:  Tachypnea EXAM: PORTABLE CHEST 1 VIEW COMPARISON:  05/25/2019 FINDINGS: Heart size appears enlarged. Lung volumes are low. Streaky right basilar opacity. There is mild pulmonary vascular congestion. No pneumothorax. IMPRESSION: 1. Cardiomegaly with mild pulmonary vascular congestion suggesting mild CHF/fluid overload. 2. Streaky right basilar opacity may reflect atelectasis and/or developing infection in the appropriate clinical  setting. Electronically Signed   By: Davina Poke M.D.   On: 07/19/2019 12:33   [place] Rt Lower Extrem Ltd Soft Tissue Non Vascular  Result Date: 07/20/2019 CLINICAL DATA:  Right lower extremity pain and swelling. EXAM: ULTRASOUND RIGHT LOWER EXTREMITY LIMITED TECHNIQUE: Ultrasound examination of the lower extremity soft tissues was performed in the area of clinical concern. COMPARISON:  May 24, 2019 FINDINGS: Limited right medial thigh sonographic evaluation demonstrates diffuse subcutaneous edema and skin thickening. No drainable fluid collection is seen. No solid masses are seen. IMPRESSION: The area of pain and swelling in the right medial lower extremity corresponds to diffuse cutaneous and subcutaneous edema. No drainable fluid collections seen. Electronically Signed   By: Fidela Salisbury M.D.   On:  07/20/2019 18:09        Scheduled Meds: . diltiazem  180 mg Oral Daily  . lisinopril  20 mg Oral Daily  . montelukast  10 mg Oral Daily  . nortriptyline  25 mg Oral QHS  . pantoprazole  40 mg Oral BID AC  . sertraline  100 mg Oral BID  . topiramate  50 mg Oral Daily  . Warfarin - Pharmacist Dosing Inpatient   Does not apply q1800   Continuous Infusions: . piperacillin-tazobactam (ZOSYN)  IV 12.5 mL/hr at 07/20/19 1551  . vancomycin 250 mL/hr at 07/20/19 1551     LOS: 1 day    Time spent: 8mins    Kathie Dike, MD [facility]   If 7PM-7AM, please contact night-coverage [email or web address]  07/20/2019, 6:54 PM

## 2019-07-20 NOTE — Progress Notes (Signed)
Nurse ordered bariatric bed for patient due to patient  weight, will continue to monitor.

## 2019-07-21 ENCOUNTER — Encounter (HOSPITAL_COMMUNITY): Payer: Self-pay | Admitting: *Deleted

## 2019-07-21 LAB — BASIC METABOLIC PANEL
Anion gap: 9 (ref 5–15)
BUN: 18 mg/dL (ref 6–20)
CO2: 23 mmol/L (ref 22–32)
Calcium: 9.5 mg/dL (ref 8.9–10.3)
Chloride: 103 mmol/L (ref 98–111)
Creatinine, Ser: 0.88 mg/dL (ref 0.44–1.00)
GFR calc Af Amer: >60 mL/min (ref >60)
GFR calc non Af Amer: >60 mL/min (ref >60)
Glucose, Bld: 110 mg/dL — ABNORMAL HIGH (ref 70–99)
Potassium: 4 mmol/L (ref 3.5–5.1)
Sodium: 135 mmol/L (ref 135–145)

## 2019-07-21 LAB — CBC
HCT: 27 % — ABNORMAL LOW (ref 36.0–46.0)
Hemoglobin: 7.9 g/dL — ABNORMAL LOW (ref 12.0–15.0)
MCH: 25.8 pg — ABNORMAL LOW (ref 26.0–34.0)
MCHC: 29.3 g/dL — ABNORMAL LOW (ref 30.0–36.0)
MCV: 88.2 fL (ref 80.0–100.0)
Platelets: 440 10*3/uL — ABNORMAL HIGH (ref 150–400)
RBC: 3.06 MIL/uL — ABNORMAL LOW (ref 3.87–5.11)
RDW: 22.5 % — ABNORMAL HIGH (ref 11.5–15.5)
WBC: 9.4 10*3/uL (ref 4.0–10.5)
nRBC: 0 % (ref 0.0–0.2)

## 2019-07-21 LAB — PROTIME-INR
INR: 9.9 (ref 0.8–1.2)
INR: 8 (ref 0.8–1.2)
Prothrombin Time: 77.6 seconds — ABNORMAL HIGH (ref 11.4–15.2)
Prothrombin Time: 65.4 seconds — ABNORMAL HIGH (ref 11.4–15.2)

## 2019-07-21 MED ORDER — PHYTONADIONE 5 MG PO TABS
5.0000 mg | ORAL_TABLET | Freq: Once | ORAL | Status: AC
  Administered 2019-07-21: 5 mg via ORAL
  Filled 2019-07-21 (×2): qty 1

## 2019-07-21 MED ORDER — MORPHINE SULFATE (PF) 2 MG/ML IV SOLN
2.0000 mg | INTRAVENOUS | Status: DC | PRN
  Administered 2019-07-21 – 2019-07-22 (×4): 2 mg via INTRAVENOUS
  Filled 2019-07-21 (×4): qty 1

## 2019-07-21 NOTE — Progress Notes (Signed)
PROGRESS NOTE    Megan Ortiz  [id number] DOB: 04-Apr-1976 DOA: 07/19/2019 PCP: Larene Beach, MD    Brief Narrative:  43 year old female with morbid obesity, recent right lower extremity DVT, presents to the hospital with complaints of worsening pain in her right lower extremity.  She was noted to have several areas of ecchymosis, induration and tenderness on the medial aspect of her right lower extremity.  Ultrasound imaging did not show any underlying fluid collections.  She is on IV antibiotics.   Assessment & Plan:   Principal Problem:   Cellulitis Active Problems:   Benign essential HTN   Morbid Obesity    DVT (deep venous thrombosis) (HCC)   Anemia   Supratherapeutic INR   GERD (gastroesophageal reflux disease)   1. Cellulitis.  Patient presents with painful areas in her right lower extremity with overlying ecchymosis and ulceration.  Initially, there was concern for possible deep tissue infection.  She was transferred to [facility] for further CT evaluation.  After patient was assessed by CT team, it was felt that her body habitus would not allow for adequate imaging of her lower extremity.    She was subsequently evaluated by ultrasound that did not show any fluid collections.  She has been on Coumadin for the last 2 months, making warfarin associated necrosis less likely.   She is currently on IV Vanco and Zosyn and has some improvement.  Areas of induration are less firm.  It does not appear that her skin findings are worsening.  I suspect that she may have some degree of thrombophlebitis.  Continue antibiotics, warm compresses.-see Photos in epic 2. Recent right lower extremity DVT.  Diagnosed in 05/2019.  She has been on Coumadin and INR is currently supratherapeutic. -INR was up to 9.9, vitamin K given, repeat INR late today 8.0, continue to monitor for any evidence of bleeding 3. Anemia.  Chronic.  Appears to be consistent with prior values. 4. GERD.  Continue PPI 5.  Hypertension-.  Stable, Continue with diltiazem and lisinopril. 6. Morbid obesity.  BMI of 92, this complicates overall care 7)Buttock Decubitus Ulcer- POA-  Buttock open area -1 cm x 0.5 cm x 0.1 cm  Wound [id number] Drainage (amount, consistency, odor) scant weeping Periwound:scarring that measures 3 cm x 3 cm to right buttock Dressing procedure/placement/frequency:Silicone foam to right buttock.  Change every three days and PRN soilage.    DVT prophylaxis: Coumadin Code Status: Full code Family Communication: None Disposition Plan: Discharge home once improved   Consultants:     Procedures:     Antimicrobials:   Vancomycin 9/5>  Zosyn 9/5 >   Subjective: Right lower extremity swelling and pain persist, --No fevers or chills -Difficulty with getting in and out of bed due to right leg swelling and pain  Objective: Vitals:   07/21/19 0750 07/21/19 1000 07/21/19 1640 07/21/19 1641  BP: (!) 108/53  (!) 93/47 (!) 91/42  Pulse: (!) 117 (!) 110 (!) 102 98  Resp: 18  16   Temp: 98.2 F (36.8 C)  97.8 F (36.6 C)   TempSrc: Oral  Oral   SpO2: 94%  91% 90%  Weight:      Height:        Intake/Output Summary (Last 24 hours) at 07/21/2019 1715 Last data filed at 07/21/2019 1500 Gross per 24 hour  Intake 1120 ml  Output -  Net 1120 ml   Filed Weights   07/19/19 1102 07/19/19 1125 07/19/19 1350  Weight: (!) 181.4 kg Marland Kitchen)  181.4 kg (!) 251 kg    Examination:  General exam: Morbidly obese, in no acute distress  respiratory system: Clear to auscultation. Respiratory effort normal. Cardiovascular system: S1 & S2 heard, RRR. No JVD, murmurs, rubs, gallops or clicks. Gastrointestinal system: Abdomen is nondistended, soft and nontender.-Significantly increased truncal adiposity  Central nervous system: Alert and oriented. No focal neurological deficits. Extremities: Symmetric 5 x 5 power. Skin: Areas of ecchymosis and induration medial right calf and medial right  thigh-tender to palpation-please see photos in epic -Buttock open area -1 cm x 0.5 cm x 0.1 cm  Wound [id number] Drainage (amount, consistency, odor) scant weeping Periwound:scarring that measures 3 cm x 3 cm to right buttock Psychiatry: Judgement and insight appear normal. Mood & affect appropriate.     Data Reviewed: CBC: Recent Labs  Lab 07/19/19 1124 07/20/19 0533 07/21/19 0339  WBC 9.3 7.7 9.4  HGB 8.6* 8.3* 7.9*  HCT 30.0* 27.8* 27.0*  MCV 90.4 86.1 88.2  PLT 482* 433* 123456*   Basic Metabolic Panel: Recent Labs  Lab 07/19/19 1153 07/20/19 0533 07/21/19 0339  NA 135 134* 135  K 4.1 3.8 4.0  CL 101 101 103  CO2 24 21* 23  GLUCOSE 103* 97 110*  BUN 11 13 18   CREATININE 0.62 0.72 0.88  CALCIUM 9.1 9.3 9.5   GFR: Estimated Creatinine Clearance: 173.3 mL/min (by C-G formula based on SCr of 0.88 mg/dL). Liver Function Tests: Recent Labs  Lab 07/19/19 1153  AST 11*  ALT 10  ALKPHOS 63  BILITOT 0.1*  PROT 8.5*  ALBUMIN 3.3*   No results for input(s): LIPASE, AMYLASE in the last 168 hours. No results for input(s): AMMONIA in the last 168 hours. Coagulation Profile: Recent Labs  Lab 07/19/19 1124 07/20/19 0753 07/21/19 0339 07/21/19 1622  INR 8.1* 8.3* 9.9* 8.0*   Cardiac Enzymes: No results for input(s): CKTOTAL, CKMB, CKMBINDEX, TROPONINI in the last 168 hours. BNP (last 3 results) No results for input(s): PROBNP in the last 8760 hours. HbA1C: No results for input(s): HGBA1C in the last 72 hours. CBG: No results for input(s): GLUCAP in the last 168 hours. Lipid Profile: No results for input(s): CHOL, HDL, LDLCALC, TRIG, CHOLHDL, LDLDIRECT in the last 72 hours. Thyroid Function Tests: No results for input(s): TSH, T4TOTAL, FREET4, T3FREE, THYROIDAB in the last 72 hours. Anemia Panel: No results for input(s): VITAMINB12, FOLATE, FERRITIN, TIBC, IRON, RETICCTPCT in the last 72 hours. Sepsis Labs: Recent Labs  Lab 07/19/19 1152  LATICACIDVEN 1.3     Recent Results (from the past 240 hour(s))  Blood culture (routine x 2)     Status: None (Preliminary result)   Collection Time: 07/19/19 11:52 AM   Specimen: Right Antecubital; Blood  Result Value Ref Range Status   Specimen Description   Final    RIGHT ANTECUBITAL BOTTLES DRAWN AEROBIC AND ANAEROBIC   Special Requests Blood Culture adequate volume  Final   Culture   Final    NO GROWTH 2 DAYS Performed at [facility], [address], [place], [place] [address]    Report Status PENDING  Incomplete  Blood culture (routine x 2)     Status: None (Preliminary result)   Collection Time: 07/19/19 12:27 PM   Specimen: Left Antecubital; Blood  Result Value Ref Range Status   Specimen Description   Final    LEFT ANTECUBITAL BOTTLES DRAWN AEROBIC AND ANAEROBIC   Special Requests Blood Culture adequate volume  Final   Culture   Final  NO GROWTH 2 DAYS Performed at [facility], [address], [place], [place] [address]    Report Status PENDING  Incomplete  SARS Coronavirus 2 [facility] order, Performed in [facility] hospital lab) Nasopharyngeal Nasopharyngeal Swab     Status: None   Collection Time: 07/19/19 12:50 PM   Specimen: Nasopharyngeal Swab  Result Value Ref Range Status   SARS Coronavirus 2 NEGATIVE NEGATIVE Final    Comment: (NOTE) If result is NEGATIVE SARS-CoV-2 target nucleic acids are NOT DETECTED. The SARS-CoV-2 RNA is generally detectable in upper and lower  respiratory specimens during the acute phase of infection. The lowest  concentration of SARS-CoV-2 viral copies this assay can detect is 250  copies / mL. A negative result does not preclude SARS-CoV-2 infection  and should not be used as the sole basis for treatment or other  patient management decisions.  A negative result may occur with  improper specimen collection / handling, submission of specimen other  than nasopharyngeal swab, presence of viral mutation(s) within the  areas targeted by this assay, and  inadequate number of viral copies  (<250 copies / mL). A negative result must be combined with clinical  observations, patient history, and epidemiological information. If result is POSITIVE SARS-CoV-2 target nucleic acids are DETECTED. The SARS-CoV-2 RNA is generally detectable in upper and lower  respiratory specimens dur ing the acute phase of infection.  Positive  results are indicative of active infection with SARS-CoV-2.  Clinical  correlation with patient history and other diagnostic information is  necessary to determine patient infection status.  Positive results do  not rule out bacterial infection or co-infection with other viruses. If result is PRESUMPTIVE POSTIVE SARS-CoV-2 nucleic acids MAY BE PRESENT.   A presumptive positive result was obtained on the submitted specimen  and confirmed on repeat testing.  While 2019 novel coronavirus  (SARS-CoV-2) nucleic acids may be present in the submitted sample  additional confirmatory testing may be necessary for epidemiological  and / or clinical management purposes  to differentiate between  SARS-CoV-2 and other Sarbecovirus currently known to infect humans.  If clinically indicated additional testing with an alternate test  methodology [phone]) is advised. The SARS-CoV-2 RNA is generally  detectable in upper and lower respiratory sp ecimens during the acute  phase of infection. The expected result is Negative. Fact Sheet for Patients:  StrictlyIdeas.no Fact Sheet for Healthcare Providers: BankingDealers.co.za This test is not yet approved or cleared by the [place] FDA and has been authorized for detection and/or diagnosis of SARS-CoV-2 by FDA under an Emergency Use Authorization (EUA).  This EUA will remain in effect (meaning this test can be used) for the duration of the COVID-19 declaration under Section 564(b)(1) of the Act, 21 U.S.C. section 360bbb-3(b)(1), unless the  authorization is terminated or revoked sooner. Performed at [facility], [address] [place], [place] [address]      Radiology Studies: [place] Rt Lower Extrem Ltd Soft Tissue Non Vascular  Result Date: 07/20/2019 CLINICAL DATA:  Right lower extremity pain and swelling. EXAM: ULTRASOUND RIGHT LOWER EXTREMITY LIMITED TECHNIQUE: Ultrasound examination of the lower extremity soft tissues was performed in the area of clinical concern. COMPARISON:  May 24, 2019 FINDINGS: Limited right medial thigh sonographic evaluation demonstrates diffuse subcutaneous edema and skin thickening. No drainable fluid collection is seen. No solid masses are seen. IMPRESSION: The area of pain and swelling in the right medial lower extremity corresponds to diffuse cutaneous and subcutaneous edema. No drainable fluid collections seen.  Electronically Signed   By: Fidela Salisbury M.D.   On: 07/20/2019 18:09    Scheduled Meds: . diltiazem  180 mg Oral Daily  . lisinopril  20 mg Oral Daily  . montelukast  10 mg Oral Daily  . nortriptyline  25 mg Oral QHS  . pantoprazole  40 mg Oral BID AC  . sertraline  100 mg Oral BID  . topiramate  50 mg Oral Daily  . Warfarin - Pharmacist Dosing Inpatient   Does not apply q1800   Continuous Infusions: . piperacillin-tazobactam (ZOSYN)  IV 3.375 g (07/21/19 1256)  . vancomycin 1,500 mg (07/21/19 1300)     LOS: 2 days    Roxan Hockey, MD [facility]   If 7PM-7AM, please contact night-coverage [email or web address]  07/21/2019, 5:15 PM

## 2019-07-21 NOTE — Consult Note (Signed)
[place] Nurse wound consult note Reason for Consult:Right buttock open area.  Patient has history of necrotizing fasciitis in this area and this is the resulting scar  She wore a sanitary pad last week which rubbed and irritated this area, she indicated. SHe states this is common.  I have implemented a silicone foam to the pad and protect this area while in bed.   Wound type:scar tissue/inflammatory Pressure Injury POA: Yes Measurement: 1 cm x 0.5 cm x 0.1 cm  Wound [id number] Drainage (amount, consistency, odor) scant weeping Periwound:scarring that measures 3 cm x 3 cm to right buttock Dressing procedure/placement/frequency:Silicone foam to right buttock.  Change every three days and PRN soilage.  Will not follow at this time.  Please re-consult if needed.  Domenic Moras MSN, RN, FNP-BC CWON Wound, Ostomy, Continence Nurse Pager [phone]

## 2019-07-21 NOTE — Progress Notes (Signed)
ANTICOAGULATION CONSULT NOTE - Follow Up Consult  Pharmacy Consult for Warfarin Indication: DVT  Allergies  Allergen Reactions  . Aspirin     Due to hx of stomach ulcers    Patient Measurements: Height: 5\' 5"  (165.1 cm) Weight: (!) 553 lb 5.7 oz (251 kg) IBW/kg (Calculated) : 57  Vital Signs: Temp: 98.2 F (36.8 C) (09/07 0750) Temp Source: Oral (09/07 0750) BP: 108/53 (09/07 0750) Pulse Rate: 117 (09/07 0750)  Labs: Recent Labs    07/19/19 1124 07/19/19 1153 07/20/19 0533 07/20/19 0753 07/21/19 0339  HGB 8.6*  --  8.3*  --  7.9*  HCT 30.0*  --  27.8*  --  27.0*  PLT 482*  --  433*  --  440*  APTT 176*  --   --   --   --   LABPROT 66.1*  --   --  67.4* 77.6*  INR 8.1*  --   --  8.3* 9.9*  CREATININE  --  0.62 0.72  --  0.88    Estimated Creatinine Clearance: 173.3 mL/min (by C-G formula based on SCr of 0.88 mg/dL).  Assessment:   Anticoag: DVT 7/20. Pta warfarin on HOLD (inr 8.1 on admit)  - INR 8.3>9.9. Hgb down to 7.9. Vit K 5mg  po x 1 on 9/7 (pta dose 10mg  on MWF and 5mg  all other days with INR 8.1)  Goal of Therapy:  INR 2-3 Monitor platelets by anticoagulation protocol: Yes   Plan:  Continue to hold Warfarin Vit K 5mg  po x 1 today Daily INR   Anzley Dibbern S. Alford Highland, PharmD, BCPS Clinical Staff Pharmacist Eilene Ghazi Stillinger 07/21/2019,9:36 AM

## 2019-07-21 NOTE — Progress Notes (Signed)
CRITICAL VALUE ALERT  Critical Value:  INR 9.9  Date & Time Notied:  07/21/19 - 8am  Provider Notified: Dr. Theresa Duty  Orders Received/Actions taken: See [facility]

## 2019-07-22 LAB — CBC
HCT: 26.2 % — ABNORMAL LOW (ref 36.0–46.0)
Hemoglobin: 7.5 g/dL — ABNORMAL LOW (ref 12.0–15.0)
MCH: 25.5 pg — ABNORMAL LOW (ref 26.0–34.0)
MCHC: 28.6 g/dL — ABNORMAL LOW (ref 30.0–36.0)
MCV: 89.1 fL (ref 80.0–100.0)
Platelets: 422 10*3/uL — ABNORMAL HIGH (ref 150–400)
RBC: 2.94 MIL/uL — ABNORMAL LOW (ref 3.87–5.11)
RDW: 22.6 % — ABNORMAL HIGH (ref 11.5–15.5)
WBC: 11.6 10*3/uL — ABNORMAL HIGH (ref 4.0–10.5)
nRBC: 0 % (ref 0.0–0.2)

## 2019-07-22 LAB — PROTIME-INR
INR: 2.1 — ABNORMAL HIGH (ref 0.8–1.2)
INR: 1.7 — ABNORMAL HIGH (ref 0.8–1.2)
Prothrombin Time: 23.4 seconds — ABNORMAL HIGH (ref 11.4–15.2)
Prothrombin Time: 20 seconds — ABNORMAL HIGH (ref 11.4–15.2)

## 2019-07-22 LAB — BASIC METABOLIC PANEL
Anion gap: 11 (ref 5–15)
BUN: 23 mg/dL — ABNORMAL HIGH (ref 6–20)
CO2: 22 mmol/L (ref 22–32)
Calcium: 9.1 mg/dL (ref 8.9–10.3)
Chloride: 102 mmol/L (ref 98–111)
Creatinine, Ser: 2.08 mg/dL — ABNORMAL HIGH (ref 0.44–1.00)
GFR calc Af Amer: 33 mL/min — ABNORMAL LOW (ref >60)
GFR calc non Af Amer: 28 mL/min — ABNORMAL LOW (ref >60)
Glucose, Bld: 118 mg/dL — ABNORMAL HIGH (ref 70–99)
Potassium: 4.2 mmol/L (ref 3.5–5.1)
Sodium: 135 mmol/L (ref 135–145)

## 2019-07-22 MED ORDER — ALBUTEROL SULFATE (2.5 MG/3ML) 0.083% IN NEBU
2.5000 mg | INHALATION_SOLUTION | RESPIRATORY_TRACT | Status: DC | PRN
  Administered 2019-07-22 – 2019-07-24 (×4): 2.5 mg via RESPIRATORY_TRACT
  Filled 2019-07-22 (×4): qty 3

## 2019-07-22 MED ORDER — LIDOCAINE HCL 1 % IJ SOLN
10.0000 mL | Freq: Once | INTRAMUSCULAR | Status: DC
  Filled 2019-07-22: qty 10

## 2019-07-22 MED ORDER — SODIUM CHLORIDE 0.9 % IV BOLUS
1000.0000 mL | Freq: Once | INTRAVENOUS | Status: AC
  Administered 2019-07-22: 17:00:00 1000 mL via INTRAVENOUS

## 2019-07-22 MED ORDER — BACITRACIN ZINC 500 UNIT/GM EX OINT
TOPICAL_OINTMENT | Freq: Once | CUTANEOUS | Status: DC
  Filled 2019-07-22: qty 28.4

## 2019-07-22 MED ORDER — ALUM & MAG HYDROXIDE-SIMETH 200-200-20 MG/5ML PO SUSP
30.0000 mL | Freq: Once | ORAL | Status: AC
  Administered 2019-07-22: 30 mL via ORAL
  Filled 2019-07-22: qty 30

## 2019-07-22 MED ORDER — LIDOCAINE HCL (PF) 1 % IJ SOLN
10.0000 mL | Freq: Once | INTRAMUSCULAR | Status: DC
  Filled 2019-07-22: qty 10

## 2019-07-22 MED ORDER — HEPARIN (PORCINE) 25000 UT/250ML-% IV SOLN
3300.0000 [IU]/h | INTRAVENOUS | Status: DC
  Administered 2019-07-22: 2000 [IU]/h via INTRAVENOUS
  Administered 2019-07-23: 2950 [IU]/h via INTRAVENOUS
  Administered 2019-07-23: 2600 [IU]/h via INTRAVENOUS
  Administered 2019-07-24 (×2): 3300 [IU]/h via INTRAVENOUS
  Filled 2019-07-22 (×5): qty 250

## 2019-07-22 MED ORDER — WARFARIN SODIUM 5 MG PO TABS: 10.0000 mg | ORAL_TABLET | Freq: Once | ORAL | Status: DC

## 2019-07-22 MED ORDER — SODIUM CHLORIDE 0.9 % IV SOLN
INTRAVENOUS | Status: DC
  Administered 2019-07-22: 12:00:00 via INTRAVENOUS

## 2019-07-22 MED ORDER — SODIUM CHLORIDE 0.9 % IV SOLN
2.0000 g | INTRAVENOUS | Status: DC
  Administered 2019-07-22 – 2019-07-23 (×2): 2 g via INTRAVENOUS
  Filled 2019-07-22 (×2): qty 20

## 2019-07-22 NOTE — Progress Notes (Signed)
ANTICOAGULATION CONSULT NOTE - Follow Up Consult  Pharmacy Consult for Warfarin/Heparin Indication: DVT  Allergies  Allergen Reactions  . Aspirin     Due to hx of stomach ulcers    Patient Measurements: Height: 5\' 5"  (165.1 cm) Weight: (!) 553 lb 5.7 oz (251 kg) IBW/kg (Calculated) : 57 kg Heparin dosing weight: 125.4 kg  Vital Signs: Temp: 98.2 F (36.8 C) (09/08 1717) Temp Source: Oral (09/08 1717) BP: 85/46 (09/08 1717) Pulse Rate: 92 (09/08 1717)  Labs: Recent Labs    07/20/19 0533  07/21/19 0339 07/21/19 1622 07/22/19 0426 07/22/19 2006  HGB 8.3*  --  7.9*  --  7.5*  --   HCT 27.8*  --  27.0*  --  26.2*  --   PLT 433*  --  440*  --  422*  --   LABPROT  --    < > 77.6* 65.4* 23.4* 20.0*  INR  --    < > 9.9* 8.0* 2.1* 1.7*  CREATININE 0.72  --  0.88  --  2.08*  --    < > = values in this interval not displayed.    Estimated Creatinine Clearance: 73.3 mL/min (A) (by C-G formula based on SCr of 2.08 mg/dL (H)).  Assessment:  Anticoag: DVT 7/20. PTA warfarin on HOLD (INR 8.1 on admit)  - INR 8.3>9.9. Hgb down to 7.5. Vit K 5 mg po x 1 on 9/7 (PTA warfarin dose: 10 mg on MWF and 5 mg all other days with INR 8.1)  Update: INR at 20:06 on 9/8 was 1.7; pharmacy consulted to start heparin when INR <2  Goal of Therapy:  Heparin 0.3-0.7 INR 2-3 Monitor platelets by anticoagulation protocol: Yes   Plan:  Start heparin infusion at 2000 units/hr (no bolus) Check 6-hr heparin level Continue to hold warfarin Vit K 5 mg PO x 1 today Daily INR, CBC, heparin level   Gillermina Hu, PharmD, BCPS, [facility] Clinical Pharmacist 07/22/2019,9:08 PM

## 2019-07-22 NOTE — Plan of Care (Signed)

## 2019-07-22 NOTE — Progress Notes (Signed)
Pt's BP at 09:00=102/50, rechecked at 10:26=83/45, held BP meds, MD notified, received and carried out new orders. At 15:22 BP=82/35, Pt had not voided since this AM, bladder scans done at 07:00=127cc, at 12:00=215, at 16:00=211. MD notified, received new orders and carried out. Pt denies nausea, lightheadedness and dizziness. Will continue to monitor.

## 2019-07-22 NOTE — Progress Notes (Signed)
PROGRESS NOTE    Megan Ortiz  [id number] DOB: June 02, 1976 DOA: 07/19/2019 PCP: Larene Beach, MD    Brief Narrative:  43 year old female with morbid obesity, recent right lower extremity DVT, presents to the hospital with complaints of worsening pain in her right lower extremity.  She was noted to have several areas of ecchymosis, induration and tenderness on the medial aspect of her right lower extremity.  Ultrasound imaging did not show any underlying fluid collections.   -Hx necrotizing fasciitis with debridement of buttock/gluteal wound 02/16/2017, and 02/19/2017. -Creatinine trending up on Vanco and Zosyn, MRSA PCR was -06/02/2019-we will de-escalate antibiotics to IV Rocephin and stop Vanco and Zosyn  Assessment & Plan:   Principal Problem:   Cellulitis Active Problems:   Benign essential HTN   Morbid Obesity    DVT (deep venous thrombosis) (HCC)   Anemia   Supratherapeutic INR   GERD (gastroesophageal reflux disease)   1. Cellulitis.  Patient presents with painful areas in her right lower extremity with overlying ecchymosis and ulceration.  Initially, there was concern for possible deep tissue infection.  She was transferred to [facility] for further CT evaluation.  After patient was assessed by CT team, it was felt that her body habitus would not allow for adequate imaging of her lower extremity.    She was subsequently evaluated by ultrasound that did not show any fluid collections.  She has been on Coumadin for the last 2 months, will need to rule out Coumadin induced skin necrosis.  -Surgical consult for possible punch biopsies of this area -Creatinine trending up on Vanco and Zosyn, MRSA PCR was -06/02/2019-we will de-escalate antibiotics to IV Rocephin and stop Vanco and Zosyn, , warm compresses.-see Photos in epic  2. Recent right lower extremity DVT.  Diagnosed in 05/2019.  She has been on Coumadin and INR  was up to 9.9, vitamin K given, repeat INR 2.1, continue to  monitor for any evidence of bleeding -We will consider not using Coumadin in this patient however DOAC have not been studied in people over 500 pounds -We will start IV heparin drip/bridge when INR drops below 2  3. Anemia.  Chronic.    Hemoglobin down to 7.5 baseline usually above 8 GERD.  Continue PPI 4. Hypertension-.  Continue trending up and BP trending lower, will discontinue lisinopril and Cardizem  5. Morbid obesity.  BMI of 92, this complicates overall care   6)Buttock Decubitus Ulcer- POA-  Buttock open area -1 cm x 0.5 cm x 0.1 cm  Wound [id number] Drainage (amount, consistency, odor) scant weeping Periwound:scarring that measures 3 cm x 3 cm to right buttock Dressing procedure/placement/frequency:Silicone foam to right buttock.  Change every three days and PRN soilage.    7)AKI-- AKI----acute kidney injury -due to dehydration and decreased blood pressure    creatinine is up to 2.0,  From 0.88    --- Stop lisinopril, hydrate IV and p.o., renally adjust medications, avoid nephrotoxic agents/dehydration/hypotension   DVT prophylaxis: Coumadin on hold Code Status: Full code Family Communication: None Disposition Plan: Discharge home once improved   Consultants:     Procedures:     Antimicrobials:   Vancomycin 9/5> thru 07/22/2019  Zosyn 9/5 > thru 07/22/2019 -Rocephin 2 gm starting 07/22/2019  Subjective: Right lower extremity swelling and pain persist, --No fevers or chills -BP trending down, creatinine trending up --No dysuria  Objective: Vitals:   07/22/19 0428 07/22/19 0900 07/22/19 1026 07/22/19 1037  BP: (!) 96/36 (!) 102/50 (!) 83/45 Marland Kitchen)  86/56  Pulse: 98 86 96   Resp: 16 16    Temp: 98.5 F (36.9 C) 98.3 F (36.8 C)    TempSrc: Oral Oral    SpO2: (!) 89% 93%    Weight:      Height:        Intake/Output Summary (Last 24 hours) at 07/22/2019 1141 Last data filed at 07/21/2019 1500 Gross per 24 hour  Intake 240 ml  Output -  Net 240 ml   Filed  Weights   07/19/19 1102 07/19/19 1125 07/19/19 1350  Weight: (!) 181.4 kg (!) 181.4 kg (!) 251 kg    Examination:  General exam: Morbidly obese, in no acute distress  respiratory system: Clear to auscultation. Respiratory effort normal. Cardiovascular system: S1 & S2 heard, RRR. No JVD, murmurs, rubs, gallops or clicks. Gastrointestinal system: Abdomen is nondistended, soft and nontender.-Significantly increased truncal adiposity/Pannus  Central nervous system: Alert and oriented. No focal neurological deficits. Extremities: Symmetric 5 x 5 power. Skin: Areas of tender ecchymosis and induration medial right calf and medial right thigh-tender to palpation-please see photos in epic -Buttock open area -1 cm x 0.5 cm x 0.1 cm  Wound [id number] Drainage (amount, consistency, odor) scant weeping Periwound:scarring that measures 3 cm x 3 cm to right buttock Psychiatry: Judgement and insight appear normal. Mood & affect appropriate.     Data Reviewed: CBC: Recent Labs  Lab 07/19/19 1124 07/20/19 0533 07/21/19 0339 07/22/19 0426  WBC 9.3 7.7 9.4 11.6*  HGB 8.6* 8.3* 7.9* 7.5*  HCT 30.0* 27.8* 27.0* 26.2*  MCV 90.4 86.1 88.2 89.1  PLT 482* 433* 440* [id number]*   Basic Metabolic Panel: Recent Labs  Lab 07/19/19 1153 07/20/19 0533 07/21/19 0339 07/22/19 0426  NA 135 134* 135 135  K 4.1 3.8 4.0 4.2  CL 101 101 103 102  CO2 24 21* 23 22  GLUCOSE 103* 97 110* 118*  BUN 11 13 18  23*  CREATININE 0.62 0.72 0.88 2.08*  CALCIUM 9.1 9.3 9.5 9.1   GFR: Estimated Creatinine Clearance: 73.3 mL/min (A) (by C-G formula based on SCr of 2.08 mg/dL (H)). Liver Function Tests: Recent Labs  Lab 07/19/19 1153  AST 11*  ALT 10  ALKPHOS 63  BILITOT 0.1*  PROT 8.5*  ALBUMIN 3.3*   No results for input(s): LIPASE, AMYLASE in the last 168 hours. No results for input(s): AMMONIA in the last 168 hours. Coagulation Profile: Recent Labs  Lab 07/19/19 1124 07/20/19 0753 07/21/19 0339 07/21/19  1622 07/22/19 0426  INR 8.1* 8.3* 9.9* 8.0* 2.1*   Cardiac Enzymes: No results for input(s): CKTOTAL, CKMB, CKMBINDEX, TROPONINI in the last 168 hours. BNP (last 3 results) No results for input(s): PROBNP in the last 8760 hours. HbA1C: No results for input(s): HGBA1C in the last 72 hours. CBG: No results for input(s): GLUCAP in the last 168 hours. Lipid Profile: No results for input(s): CHOL, HDL, LDLCALC, TRIG, CHOLHDL, LDLDIRECT in the last 72 hours. Thyroid Function Tests: No results for input(s): TSH, T4TOTAL, FREET4, T3FREE, THYROIDAB in the last 72 hours. Anemia Panel: No results for input(s): VITAMINB12, FOLATE, FERRITIN, TIBC, IRON, RETICCTPCT in the last 72 hours. Sepsis Labs: Recent Labs  Lab 07/19/19 1152  LATICACIDVEN 1.3    Recent Results (from the past 240 hour(s))  Blood culture (routine x 2)     Status: None (Preliminary result)   Collection Time: 07/19/19 11:52 AM   Specimen: Right Antecubital; Blood  Result Value Ref Range Status   Specimen Description  Final    RIGHT ANTECUBITAL BOTTLES DRAWN AEROBIC AND ANAEROBIC   Special Requests Blood Culture adequate volume  Final   Culture   Final    NO GROWTH 3 DAYS Performed at [facility], [address], [place], [place] [address]    Report Status PENDING  Incomplete  Blood culture (routine x 2)     Status: None (Preliminary result)   Collection Time: 07/19/19 12:27 PM   Specimen: Left Antecubital; Blood  Result Value Ref Range Status   Specimen Description   Final    LEFT ANTECUBITAL BOTTLES DRAWN AEROBIC AND ANAEROBIC   Special Requests Blood Culture adequate volume  Final   Culture   Final    NO GROWTH 3 DAYS Performed at [facility], [address], [place], [place] [address]    Report Status PENDING  Incomplete  SARS Coronavirus 2 [facility] order, Performed in [place] hospital lab) Nasopharyngeal Nasopharyngeal Swab     Status: None   Collection Time: 07/19/19 12:50 PM   Specimen:  Nasopharyngeal Swab  Result Value Ref Range Status   SARS Coronavirus 2 NEGATIVE NEGATIVE Final    Comment: (NOTE) If result is NEGATIVE SARS-CoV-2 target nucleic acids are NOT DETECTED. The SARS-CoV-2 RNA is generally detectable in upper and lower  respiratory specimens during the acute phase of infection. The lowest  concentration of SARS-CoV-2 viral copies this assay can detect is 250  copies / mL. A negative result does not preclude SARS-CoV-2 infection  and should not be used as the sole basis for treatment or other  patient management decisions.  A negative result may occur with  improper specimen collection / handling, submission of specimen other  than nasopharyngeal swab, presence of viral mutation(s) within the  areas targeted by this assay, and inadequate number of viral copies  (<250 copies / mL). A negative result must be combined with clinical  observations, patient history, and epidemiological information. If result is POSITIVE SARS-CoV-2 target nucleic acids are DETECTED. The SARS-CoV-2 RNA is generally detectable in upper and lower  respiratory specimens dur ing the acute phase of infection.  Positive  results are indicative of active infection with SARS-CoV-2.  Clinical  correlation with patient history and other diagnostic information is  necessary to determine patient infection status.  Positive results do  not rule out bacterial infection or co-infection with other viruses. If result is PRESUMPTIVE POSTIVE SARS-CoV-2 nucleic acids MAY BE PRESENT.   A presumptive positive result was obtained on the submitted specimen  and confirmed on repeat testing.  While 2019 novel coronavirus  (SARS-CoV-2) nucleic acids may be present in the submitted sample  additional confirmatory testing may be necessary for epidemiological  and / or clinical management purposes  to differentiate between  SARS-CoV-2 and other Sarbecovirus currently known to infect humans.  If clinically  indicated additional testing with an alternate test  methodology [phone]) is advised. The SARS-CoV-2 RNA is generally  detectable in upper and lower respiratory sp ecimens during the acute  phase of infection. The expected result is Negative. Fact Sheet for Patients:  StrictlyIdeas.no Fact Sheet for Healthcare Providers: BankingDealers.co.za This test is not yet approved or cleared by the [place] FDA and has been authorized for detection and/or diagnosis of SARS-CoV-2 by FDA under an Emergency Use Authorization (EUA).  This EUA will remain in effect (meaning this test can be used) for the duration of the COVID-19 declaration under Section 564(b)(1) of the Act, 21 U.S.C. section 360bbb-3(b)(1), unless the authorization is  terminated or revoked sooner. Performed at [facility], [address] [place],  [address]      Radiology Studies: [place] Rt Lower Extrem Ltd Soft Tissue Non Vascular  Result Date: 07/20/2019 CLINICAL DATA:  Right lower extremity pain and swelling. EXAM: ULTRASOUND RIGHT LOWER EXTREMITY LIMITED TECHNIQUE: Ultrasound examination of the lower extremity soft tissues was performed in the area of clinical concern. COMPARISON:  May 24, 2019 FINDINGS: Limited right medial thigh sonographic evaluation demonstrates diffuse subcutaneous edema and skin thickening. No drainable fluid collection is seen. No solid masses are seen. IMPRESSION: The area of pain and swelling in the right medial lower extremity corresponds to diffuse cutaneous and subcutaneous edema. No drainable fluid collections seen. Electronically Signed   By: Fidela Salisbury M.D.   On: 07/20/2019 18:09    Scheduled Meds: . diltiazem  180 mg Oral Daily  . montelukast  10 mg Oral Daily  . nortriptyline  25 mg Oral QHS  . pantoprazole  40 mg Oral BID AC  . sertraline  100 mg Oral BID  . topiramate  50 mg Oral Daily   Continuous Infusions: . sodium  chloride    . piperacillin-tazobactam (ZOSYN)  IV 3.375 g (07/22/19 [id number])     LOS: 3 days    Roxan Hockey, MD [facility]   If 7PM-7AM, please contact night-coverage [email or web address]  07/22/2019, 11:41 AM

## 2019-07-22 NOTE — Consult Note (Signed)
[facility] Surgery Consult Note  Megan Ortiz [birth year]  [id number].    Requesting MD: Roxan Hockey Chief Complaint:  Right leg pain Reason for Consult: Request skin biopsy to rule out Coumadin induced skin necrosis  HPI:  Patient is a 43 year old female who presented to the ED at [facility] on 07/19/2019 with right lower extremity pain.  She has a history of pulmonary embolus, she has an IVC filter. She has a history of necrotizing fasciitis, asthma, morbid obesity with a BMI of 92.  In July 2020, she developed DVT in the right lower extremity and was placed on Coumadin.  Approximately 2 weeks ago she started having some swelling and increasing pain in the right lower extremity.  Pain became progressively worse and she presented to the ED for evaluation.  Work-up in the ED at [facility] show she has a INR of 8.1, APTT of 176, white count of 9.3, hemoglobin of 8.6, hematocrit of 30, platelets 482,000.  Glucose of 103, albumin of 3.3 AST of 11, ALT 10, total bilirubin 0.1.  COVID on 07/19/2019 was negative.  Chest x-ray shows cardiomegaly with vascular congestion with mild congestive heart failure/fluid overload.  There is some streaky right basilar opacity which might reflect atelectasis or possible infection.  She is admitted to [facility].  There was concern that she had a deep tissue infection.  Ultrasound of the soft tissue was obtained on 07/20/2019.  The area of pain and swelling in the right medial lower extremity corresponds to diffuse cutaneous and subcutaneous edema.  Because of her body habitus she could not undergo CT there and she was transferred to [facility] for further evaluation.  On transfer to [facility] she was treated with vitamin K and her INR is improving.  Dr. Denton Brick is concerned that there is swelling and induration is secondary to Coumadin necrosis.  They would like [place] to evaluate for possible skin biopsy.  Currently she  is afebrile and vital signs are stable.  Her blood pressure is on the low end last 1 was 82/38.  Labs show creatinine going up.  Her creatinine was 0.62 on admission it is up to 2.08 today.  WBC is 11.6, hemoglobin is 7.5, hematocrit is 26.2, platelets are 422,000.  INR is down to 2.1 today  ROS: Review of Systems  Constitutional: Positive for malaise/fatigue. Negative for weight loss.  HENT: Negative.   Eyes: Negative.   Respiratory: Negative.   Cardiovascular: Positive for leg swelling.  Gastrointestinal: Negative.   Genitourinary: Negative.   Musculoskeletal: Negative for joint pain (She denies any significant joint pain, just generalized soreness at times.).       She gets around the house and does some housework several times a day.  Otherwise she stays within the house.  She has not left for some time secondary to anxiety.  Skin:       There are pictures on the chart but the areas of induration along the right medial thigh.  Areas are indurated there is clear ecchymosis below the sites.  There is no skin necrosis or breakdown of the skin so far.  There is also some mild cellulitis surrounding each of these areas.  Neurological: Negative.   Endo/Heme/Allergies: Bruises/bleeds easily.  Psychiatric/Behavioral: Positive for depression.    Family History  Problem Relation Age of Onset  . Depression Mother   . Alcohol abuse Mother     Past Medical History:  Diagnosis Date  . Anemia   .  Anxiety   . Asthma   . DVT (deep venous thrombosis) ([place])   . Gastric ulcer   . Headache   . Hiatal hernia   . Hypertension   . PE (pulmonary embolism)   . Tachycardia     Past Surgical History:  Procedure Laterality Date  . CESAREAN SECTION    . CHOLECYSTECTOMY    . ESOPHAGOGASTRODUODENOSCOPY (EGD) WITH PROPOFOL N/A 05/25/2019   Procedure: ESOPHAGOGASTRODUODENOSCOPY (EGD) WITH PROPOFOL;  Surgeon: [name], MD;  Location: [facility] ENDOSCOPY;  Service: Endoscopy;  Laterality: N/A;  .  INCISION AND DRAINAGE ABSCESS Left 02/16/2017   Procedure: DEBRIDEMENT LEFT BUTTOCK ABSCESS;  Surgeon: Fanny Skates, MD;  Location: [place];  Service: General;  Laterality: Left;  . IRRIGATION AND DEBRIDEMENT BUTTOCKS Left 02/19/2017   Procedure: DEBRIDEMENT OF GLUTEAL WOUND;  Surgeon: Rolm Bookbinder, MD;  Location: [place];  Service: General;  Laterality: Left;    Social History:  reports that she has quit smoking. Her smoking use included cigarettes. She has never used smokeless tobacco. She reports current alcohol use. She reports that she does not use drugs.  Allergies:  Allergies  Allergen Reactions  . Aspirin     Due to hx of stomach ulcers    Medications Prior to Admission  Medication Sig Dispense Refill  . acetaminophen (TYLENOL) 500 MG tablet Take 500 mg by mouth every 6 (six) hours as needed for moderate pain.    Marland Kitchen albuterol (VENTOLIN HFA) 108 (90 BASE) MCG/ACT inhaler Inhale 2 puffs into the lungs every 6 (six) hours as needed for wheezing.    . diltiazem (TIAZAC) 180 MG 24 hr capsule Take 180 mg by mouth daily.    Marland Kitchen esomeprazole (NEXIUM) 20 MG capsule Take 20 mg by mouth daily at 12 noon.    . ferrous sulfate 325 (65 FE) MG tablet Take 1 tablet (325 mg total) by mouth 2 (two) times daily with a meal. 120 tablet 0  . lisinopril (ZESTRIL) 20 MG tablet Take 20 mg by mouth daily.    . montelukast (SINGULAIR) 10 MG tablet Take 10 mg by mouth daily.    . nortriptyline (PAMELOR) 25 MG capsule Take 25 mg by mouth at bedtime.    . ondansetron (ZOFRAN) 4 MG tablet Take 1 tablet (4 mg total) by mouth every 8 (eight) hours as needed for nausea or vomiting. 20 tablet 0  . pantoprazole (PROTONIX) 40 MG tablet Take 1 tablet (40 mg total) by mouth 2 (two) times daily before a meal. 60 tablet 1  . sertraline (ZOLOFT) 100 MG tablet Take 1 tablet (100 mg total) by mouth 2 (two) times daily. 60 tablet 2  . topiramate (TOPAMAX) 50 MG tablet Take 50 mg by mouth daily.  11  . warfarin (COUMADIN) 10 MG  tablet Take 1 tablet (10 mg total) by mouth daily at 6 PM for 30 doses. (Patient taking differently: Take 10 mg by mouth daily at 6 PM. On Monday ,Wednesday and Friday and 5 mg on Tuesday Thursday, Saturday and Sunday) 30 tablet 0    Blood pressure (!) 72/33, pulse 96, temperature 98.3 F (36.8 C), temperature source Oral, resp. rate 18, height 5\' 5"  (1.651 m), weight (!) 251 kg, last menstrual period 06/18/2019, SpO2 96 %. Physical Exam: Physical Exam Constitutional:      General: She is not in acute distress.    Appearance: She is obese. She is not ill-appearing, toxic-appearing or diaphoretic.     Comments: Patient is a morbidly obese woman  who is in bed.  She is fairly sedentary but is up at home, doing some housework several times a day normally.  She denies respiratory issues at home.  Been on 3 L nasal cannula here with sats in the mid 90s.  HENT:     Head: Normocephalic and atraumatic.     Nose: No congestion or rhinorrhea.     Mouth/Throat:     Mouth: Mucous membranes are moist.     Pharynx: No oropharyngeal exudate or posterior oropharyngeal erythema.  Eyes:     General: No scleral icterus.       Left eye: No discharge.     Conjunctiva/sclera: Conjunctivae normal.     Comments: Pupils are equal  Neck:     Musculoskeletal: Normal range of motion and neck supple. No neck rigidity or muscular tenderness.     Vascular: No carotid bruit.  Cardiovascular:     Rate and Rhythm: Normal rate and regular rhythm.     Pulses: Normal pulses.     Heart sounds: Normal heart sounds. No murmur.  Pulmonary:     Effort: Pulmonary effort is normal.     Breath sounds: Normal breath sounds.  Abdominal:     General: Bowel sounds are normal. There is no distension.     Palpations: Abdomen is soft. There is no mass.     Tenderness: There is no abdominal tenderness. There is no right CVA tenderness, left CVA tenderness, guarding or rebound.     Hernia: No hernia is present.     Comments: She is  morbidly obese with a huge pannus.  There are no other skin lesions noted under the pannus, or on the abdomen.  Musculoskeletal: Normal range of motion.        General: Swelling and tenderness present.     Comments: The lesions are as noted on the picture below.  There is areas of ecchymosis.  Surrounding the areas of ecchymosis the tissue is indurated and hard.  There is some mild erythema and the sites are tender.  There are no other lesions that I can see anyplace else except for the right thigh.  Lymphadenopathy:     Cervical: No cervical adenopathy.  Skin:    General: Skin is warm and dry.     Capillary Refill: Capillary refill takes less than 2 seconds.     Comments: Areas of ecchymosis noted in picture below.  Areas are indurated with mild cellulitis.  They are tender to palpation.  Neurological:     General: No focal deficit present.     Mental Status: She is alert and oriented to person, place, and time.     Cranial Nerves: No cranial nerve deficit.  Psychiatric:        Mood and Affect: Mood normal.        Behavior: Behavior normal.        Thought Content: Thought content normal.        Judgment: Judgment normal.       Results for orders placed or performed during the hospital encounter of 07/19/19 (from the past 48 hour(s))  CBC     Status: Abnormal   Collection Time: 07/21/19  3:39 AM  Result Value Ref Range   WBC 9.4 4.0 - 10.5 K/uL   RBC 3.06 (L) 3.87 - 5.11 MIL/uL   Hemoglobin 7.9 (L) 12.0 - 15.0 g/dL   HCT 27.0 (L) 36.0 - 46.0 %   MCV 88.2 80.0 - 100.0 fL   MCH  25.8 (L) 26.0 - 34.0 pg   MCHC 29.3 (L) 30.0 - 36.0 g/dL   RDW 22.5 (H) 11.5 - 15.5 %   Platelets 440 (H) 150 - 400 K/uL   nRBC 0.0 0.0 - 0.2 %    Comment: Performed at [facility] [address], [place], [place] [address]  Protime-INR     Status: Abnormal   Collection Time: 07/21/19  3:39 AM  Result Value Ref Range   Prothrombin Time 77.6 (H) 11.4 - 15.2 seconds   INR 9.9 (HH) 0.8 - 1.2     Comment: REPEATED TO VERIFY CRITICAL RESULT CALLED TO, READ BACK BY AND VERIFIED WITH: RN M ADAMES @ [phone] 07/21/19 BY S GEZAHEGN (NOTE) INR goal varies based on device and disease states. Performed at [facility], [place] [address] [place], [place] [id number]   Basic metabolic panel     Status: Abnormal   Collection Time: 07/21/19  3:39 AM  Result Value Ref Range   Sodium 135 135 - 145 mmol/L   Potassium 4.0 3.5 - 5.1 mmol/L   Chloride 103 98 - 111 mmol/L   CO2 23 22 - 32 mmol/L   Glucose, Bld 110 (H) 70 - 99 mg/dL   BUN 18 6 - 20 mg/dL   Creatinine, Ser 0.88 0.44 - 1.00 mg/dL   Calcium 9.5 8.9 - 10.3 mg/dL   GFR calc non Af Amer >60 >60 mL/min   GFR calc Af Amer >60 >60 mL/min   Anion gap 9 5 - 15    Comment: Performed at [facility], [place] [address], [place], [place] [address]  Protime-INR     Status: Abnormal   Collection Time: 07/21/19  4:22 PM  Result Value Ref Range   Prothrombin Time 65.4 (H) 11.4 - 15.2 seconds   INR 8.0 (HH) 0.8 - 1.2    Comment: REPEATED TO VERIFY CRITICAL RESULT CALLED TO, READ BACK BY AND VERIFIED WITH: M.ADAMS RN [id number] 07/21/2019 MCCORMICK K (NOTE) INR goal varies based on device and disease states. Performed at [facility], [place] [address], [place], [place] [address]   CBC     Status: Abnormal   Collection Time: 07/22/19  4:26 AM  Result Value Ref Range   WBC 11.6 (H) 4.0 - 10.5 K/uL   RBC 2.94 (L) 3.87 - 5.11 MIL/uL   Hemoglobin 7.5 (L) 12.0 - 15.0 g/dL   HCT 26.2 (L) 36.0 - 46.0 %   MCV 89.1 80.0 - 100.0 fL   MCH 25.5 (L) 26.0 - 34.0 pg   MCHC 28.6 (L) 30.0 - 36.0 g/dL   RDW 22.6 (H) 11.5 - 15.5 %   Platelets 422 (H) 150 - 400 K/uL   nRBC 0.0 0.0 - 0.2 %    Comment: Performed at [facility], [place] [address] [place], [place] [address]  Protime-INR     Status: Abnormal   Collection Time: 07/22/19  4:26 AM  Result Value Ref Range   Prothrombin Time 23.4 (H) 11.4 - 15.2 seconds   INR 2.1 (H) 0.8 - 1.2    Comment:  (NOTE) INR goal varies based on device and disease states. Performed at [facility], [place] [address], [place],  [id number]   Basic metabolic panel     Status: Abnormal   Collection Time: 07/22/19  4:26 AM  Result Value Ref Range   Sodium 135 135 - 145 mmol/L   Potassium 4.2 3.5 - 5.1  mmol/L   Chloride 102 98 - 111 mmol/L   CO2 22 22 - 32 mmol/L   Glucose, Bld 118 (H) 70 - 99 mg/dL   BUN 23 (H) 6 - 20 mg/dL   Creatinine, Ser 2.08 (H) 0.44 - 1.00 mg/dL    Comment: DELTA CHECK NOTED   Calcium 9.1 8.9 - 10.3 mg/dL   GFR calc non Af Amer 28 (L) >60 mL/min   GFR calc Af Amer 33 (L) >60 mL/min   Anion gap 11 5 - 15    Comment: Performed at [facility] [address], [place], [place] [address]   [place] Rt Lower Extrem Ltd Soft Tissue Non Vascular  Result Date: 07/20/2019 CLINICAL DATA:  Right lower extremity pain and swelling. EXAM: ULTRASOUND RIGHT LOWER EXTREMITY LIMITED TECHNIQUE: Ultrasound examination of the lower extremity soft tissues was performed in the area of clinical concern. COMPARISON:  May 24, 2019 FINDINGS: Limited right medial thigh sonographic evaluation demonstrates diffuse subcutaneous edema and skin thickening. No drainable fluid collection is seen. No solid masses are seen. IMPRESSION: The area of pain and swelling in the right medial lower extremity corresponds to diffuse cutaneous and subcutaneous edema. No drainable fluid collections seen. Electronically Signed   By: Fidela Salisbury M.D.   On: 07/20/2019 18:09   . sodium chloride 150 mL/hr at 07/22/19 1152  . piperacillin-tazobactam (ZOSYN)  IV 3.375 g (07/22/19 1255)     Assessment/Plan DVT on Coumadin -July 2020 Supratherapeutic Coumadin INR 8.3 on admission Hx PE with IVC Morbid obesity BMI 92 Anemia CKD -creatinine up to 2.08 Supra therapeutic INR GERD Hx necrotizing fasciitis with debridement of buttock/gluteal wound 02/16/2017, and 02/19/2017.   Ecchymosis, cellulitis, -possible  Coumadin related necrosis medial aspect right lower extremity  FEN: IV fluids/heart healthy diet DVT: Supratherapeutic INR -pharmacy following ID: Zosyn 9/5 >> day 4; vancomycin 9/5 >> Day 4   Plan: We will review with Dr. Kieth Brightly and obtain necessary supplies for possible skin biopsy tomorrow.    Earnstine Regal [facility] Surgery 07/22/2019, 12:44 PM Pager: [phone] Consults: [phone]

## 2019-07-23 LAB — BASIC METABOLIC PANEL
Anion gap: 12 (ref 5–15)
BUN: 32 mg/dL — ABNORMAL HIGH (ref 6–20)
CO2: 19 mmol/L — ABNORMAL LOW (ref 22–32)
Calcium: 8.4 mg/dL — ABNORMAL LOW (ref 8.9–10.3)
Chloride: 101 mmol/L (ref 98–111)
Creatinine, Ser: 3.76 mg/dL — ABNORMAL HIGH (ref 0.44–1.00)
GFR calc Af Amer: 16 mL/min — ABNORMAL LOW (ref >60)
GFR calc non Af Amer: 14 mL/min — ABNORMAL LOW (ref >60)
Glucose, Bld: 98 mg/dL (ref 70–99)
Potassium: 4.4 mmol/L (ref 3.5–5.1)
Sodium: 132 mmol/L — ABNORMAL LOW (ref 135–145)

## 2019-07-23 LAB — HEPARIN LEVEL (UNFRACTIONATED)
Heparin Unfractionated: <0.1 IU/mL — ABNORMAL LOW (ref 0.30–0.70)
Heparin Unfractionated: 0.12 IU/mL — ABNORMAL LOW (ref 0.30–0.70)

## 2019-07-23 LAB — CBC
HCT: 23.7 % — ABNORMAL LOW (ref 36.0–46.0)
Hemoglobin: 7.1 g/dL — ABNORMAL LOW (ref 12.0–15.0)
MCH: 26.5 pg (ref 26.0–34.0)
MCHC: 30 g/dL (ref 30.0–36.0)
MCV: 88.4 fL (ref 80.0–100.0)
Platelets: 395 10*3/uL (ref 150–400)
RBC: 2.68 MIL/uL — ABNORMAL LOW (ref 3.87–5.11)
RDW: 22.7 % — ABNORMAL HIGH (ref 11.5–15.5)
WBC: 13.9 10*3/uL — ABNORMAL HIGH (ref 4.0–10.5)
nRBC: 0 % (ref 0.0–0.2)

## 2019-07-23 LAB — PROTIME-INR
INR: 1.6 — ABNORMAL HIGH (ref 0.8–1.2)
Prothrombin Time: 18.8 seconds — ABNORMAL HIGH (ref 11.4–15.2)

## 2019-07-23 LAB — PREPARE RBC (CROSSMATCH)

## 2019-07-23 MED ORDER — INFLUENZA VAC SPLIT QUAD 0.5 ML IM SUSY
0.5000 mL | PREFILLED_SYRINGE | INTRAMUSCULAR | Status: AC
  Administered 2019-08-06: 0.5 mL via INTRAMUSCULAR
  Filled 2019-07-23 (×2): qty 0.5

## 2019-07-23 MED ORDER — OXYCODONE HCL 5 MG PO TABS
10.0000 mg | ORAL_TABLET | Freq: Once | ORAL | Status: AC
  Administered 2019-07-23: 10 mg via ORAL

## 2019-07-23 MED ORDER — SODIUM CHLORIDE 0.9% IV SOLUTION
Freq: Once | INTRAVENOUS | Status: AC
  Administered 2019-07-23: 18:00:00 via INTRAVENOUS

## 2019-07-23 MED ORDER — FUROSEMIDE 10 MG/ML IJ SOLN
40.0000 mg | Freq: Once | INTRAMUSCULAR | Status: AC
  Administered 2019-07-23: 40 mg via INTRAVENOUS
  Filled 2019-07-23: qty 4

## 2019-07-23 NOTE — Progress Notes (Signed)
Started 1 unit PRBC transfusion, no adverse reactions noted, vital signs taken and recorded, will continue to monitor and endorse accordingly.

## 2019-07-23 NOTE — Progress Notes (Signed)
ANTICOAGULATION CONSULT NOTE - Follow Up Consult  Pharmacy Consult for heparin Indication: DVT  Labs: Recent Labs    07/20/19 0533  07/21/19 0339  07/22/19 0426 07/22/19 2006 07/23/19 0419  HGB 8.3*  --  7.9*  --  7.5*  --  7.1*  HCT 27.8*  --  27.0*  --  26.2*  --  23.7*  PLT 433*  --  440*  --  422*  --  395  LABPROT  --    < > 77.6*   < > 23.4* 20.0* 18.8*  INR  --    < > 9.9*   < > 2.1* 1.7* 1.6*  HEPARINUNFRC  --   --   --   --   --   --  <0.10*  CREATININE 0.72  --  0.88  --  2.08*  --   --    < > = values in this interval not displayed.    Assessment: 43yo female subtherapeutic on heparin with initial dosing while Coumadin on hold (required up to 2800 units/hr on recent admission); no gtt issues or signs of bleeding per RN.  Goal of Therapy:  Heparin level 0.3-0.7 units/ml   Plan:  Will increase heparin gtt by 4 units/kgABW/hr to 2600 units/hr and check level in 6 hours.    Wynona Neat, PharmD, BCPS  07/23/2019,5:23 AM

## 2019-07-23 NOTE — Progress Notes (Signed)
PROGRESS NOTE    Megan Ortiz  [id number] DOB: April 22, 1976 DOA: 07/19/2019 PCP: Larene Beach, MD    Brief Narrative:  43 year old female with morbid obesity, recent right lower extremity DVT, presents to the hospital with complaints of worsening pain in her right lower extremity.  She was noted to have several areas of ecchymosis, induration and tenderness on the medial aspect of her right lower extremity.  Ultrasound imaging did not show any underlying fluid collections.   -Hx necrotizing fasciitis with debridement of buttock/gluteal wound 02/16/2017, and 02/19/2017. -Creatinine trending up on Vanco and Zosyn, MRSA PCR was -06/02/2019-we will de-escalate antibiotics to IV Rocephin and stop Vanco and Zosyn  Assessment & Plan:   Principal Problem:   Cellulitis Active Problems:   Benign essential HTN   Morbid Obesity    DVT (deep venous thrombosis) (HCC)   Anemia   Supratherapeutic INR   GERD (gastroesophageal reflux disease)   1. Cellulitis.  Patient presents with painful areas in her right lower extremity with overlying ecchymosis and ulceration.  Initially, there was concern for possible deep tissue infection.  She was transferred to [facility] for further CT evaluation.  After patient was assessed by CT team, it was felt that her body habitus would not allow for adequate imaging of her lower extremity.    She was subsequently evaluated by ultrasound that did not show any fluid collections.  She has been on Coumadin for the last 2 months, will need to rule out Coumadin induced skin necrosis.  -Surgical consult for possible punch biopsies of this area -Creatinine trending up on Vanco and Zosyn, MRSA PCR was -06/02/2019-we will de-escalate antibiotics to IV Rocephin and stop Vanco and Zosyn, , warm compresses.-see Photos in epic  2. Recent right lower extremity DVT.  Diagnosed in 05/2019.  She has been on Coumadin and INR  was up to 9.9, vitamin K given, repeat INR 2.1, continue to  monitor for any evidence of bleeding -We will consider not using Coumadin in this patient however DOAC have not been studied in people over 500 pounds -We will start IV heparin drip/bridge when INR drops below 2  3. Symptomatic acute on chronic anemia -    Hemoglobin down to 7.1 baseline usually above 8 ,  --patient with dizziness and soft BP (SBP in the 80s) -Risk, benefits and alternatives to transfusion of blood products discussed. Indication for transfusion discussed. Consent obtained Please Transfuse 1 units of packed red blood cells,   please give Lasix 40 mg IV x1 after  packed cells is infused   4. GERD.  Continue PPI  5)Hypertension-.  -BP remains low, already discontinued lisinopril and Cardizem, continue IV fluids   6)Morbid obesity.  BMI of 92, this complicates overall care   7)Buttock Decubitus Ulcer- POA-  Buttock open area -1 cm x 0.5 cm x 0.1 cm  Wound [id number] Drainage (amount, consistency, odor) scant weeping Periwound:scarring that measures 3 cm x 3 cm to right buttock Dressing procedure/placement/frequency:Silicone foam to right buttock.  Change every three days and PRN soilage.    8)AKI-- AKI----acute kidney injury -due to dehydration and decreased blood pressure and possible nephrotoxic effects from combination of Vanco and Zosyn compounded by lisinopril use - creatinine is up to 3.76,  From 0.88 (baseline)   -Urine output is poor -Continue IV fluids --- Stopped lisinopril, stopped Vanco and Zosyn, renally adjust medications, avoid nephrotoxic agents/dehydration/hypotension -Doubt obstructive uropathy, consider nephrology consult if renal function does not improve with above measures  DVT prophylaxis: Coumadin on hold/on IV heparin bridge Code Status: Full code Family Communication: None Disposition Plan: Discharge home once improved   Consultants:     Procedures:     Antimicrobials:   Vancomycin 9/5> thru 07/22/2019  Zosyn 9/5 > thru 07/22/2019  -Rocephin 2 gm starting 07/22/2019  Subjective: - Decreased urine output, soft blood pressures, -No chest pains no palpitations, patient with dizziness, but no dyspnea  Objective: Vitals:   07/23/19 1608 07/23/19 1623 07/23/19 1740 07/23/19 1803  BP:  (!) 86/54 (!) 81/42 (!) 96/46  Pulse: (!) 104 96 (!) 101 66  Resp:   16 15  Temp:   98.4 F (36.9 C) 98.6 F (37 C)  TempSrc:   Oral Oral  SpO2:   98% 97%  Weight:      Height:        Intake/Output Summary (Last 24 hours) at 07/23/2019 1850 Last data filed at 07/23/2019 1750 Gross per 24 hour  Intake 1113.36 ml  Output 0 ml  Net 1113.36 ml   Filed Weights   07/19/19 1102 07/19/19 1125 07/19/19 1350  Weight: (!) 181.4 kg (!) 181.4 kg (!) 251 kg    Examination:  General exam: Morbidly obese, in no acute distress  respiratory system: Clear to auscultation. Respiratory effort normal. Cardiovascular system: S1 & S2 heard, RRR. No JVD, murmurs, rubs, gallops or clicks. Gastrointestinal system: Abdomen is nondistended, soft and nontender.-Significantly increased truncal adiposity/Pannus  Central nervous system: Alert and oriented. No focal neurological deficits. Extremities: Symmetric 5 x 5 power. Skin: Areas of tender ecchymosis and induration medial right calf and medial right thigh-tender to palpation-please see photos in epic -Buttock open area -1 cm x 0.5 cm x 0.1 cm  Wound [id number] Drainage (amount, consistency, odor) scant weeping Periwound:scarring that measures 3 cm x 3 cm to right buttock Psychiatry: Judgement and insight appear normal. Mood & affect appropriate.   Data Reviewed: CBC: Recent Labs  Lab 07/19/19 1124 07/20/19 0533 07/21/19 0339 07/22/19 0426 07/23/19 0419  WBC 9.3 7.7 9.4 11.6* 13.9*  HGB 8.6* 8.3* 7.9* 7.5* 7.1*  HCT 30.0* 27.8* 27.0* 26.2* 23.7*  MCV 90.4 86.1 88.2 89.1 88.4  PLT 482* 433* 440* 422* [id number]   Basic Metabolic Panel: Recent Labs  Lab 07/19/19 1153 07/20/19 0533 07/21/19 0339  07/22/19 0426 07/23/19 0419  NA 135 134* 135 135 132*  K 4.1 3.8 4.0 4.2 4.4  CL 101 101 103 102 101  CO2 24 21* 23 22 19*  GLUCOSE 103* 97 110* 118* 98  BUN 11 13 18  23* 32*  CREATININE 0.62 0.72 0.88 2.08* 3.76*  CALCIUM 9.1 9.3 9.5 9.1 8.4*   GFR: Estimated Creatinine Clearance: 40.6 mL/min (A) (by C-G formula based on SCr of 3.76 mg/dL (H)). Liver Function Tests: Recent Labs  Lab 07/19/19 1153  AST 11*  ALT 10  ALKPHOS 63  BILITOT 0.1*  PROT 8.5*  ALBUMIN 3.3*   No results for input(s): LIPASE, AMYLASE in the last 168 hours. No results for input(s): AMMONIA in the last 168 hours. Coagulation Profile: Recent Labs  Lab 07/21/19 0339 07/21/19 1622 07/22/19 0426 07/22/19 2006 07/23/19 0419  INR 9.9* 8.0* 2.1* 1.7* 1.6*   Cardiac Enzymes: No results for input(s): CKTOTAL, CKMB, CKMBINDEX, TROPONINI in the last 168 hours. BNP (last 3 results) No results for input(s): PROBNP in the last 8760 hours. HbA1C: No results for input(s): HGBA1C in the last 72 hours. CBG: No results for input(s): GLUCAP in the last 168 hours.  Lipid Profile: No results for input(s): CHOL, HDL, LDLCALC, TRIG, CHOLHDL, LDLDIRECT in the last 72 hours. Thyroid Function Tests: No results for input(s): TSH, T4TOTAL, FREET4, T3FREE, THYROIDAB in the last 72 hours. Anemia Panel: No results for input(s): VITAMINB12, FOLATE, FERRITIN, TIBC, IRON, RETICCTPCT in the last 72 hours. Sepsis Labs: Recent Labs  Lab 07/19/19 1152  LATICACIDVEN 1.3    Recent Results (from the past 240 hour(s))  Blood culture (routine x 2)     Status: None (Preliminary result)   Collection Time: 07/19/19 11:52 AM   Specimen: Right Antecubital; Blood  Result Value Ref Range Status   Specimen Description   Final    RIGHT ANTECUBITAL BOTTLES DRAWN AEROBIC AND ANAEROBIC   Special Requests Blood Culture adequate volume  Final   Culture   Final    NO GROWTH 4 DAYS Performed at [facility], [address],  [place], [place] [address]    Report Status PENDING  Incomplete  Blood culture (routine x 2)     Status: None (Preliminary result)   Collection Time: 07/19/19 12:27 PM   Specimen: Left Antecubital; Blood  Result Value Ref Range Status   Specimen Description   Final    LEFT ANTECUBITAL BOTTLES DRAWN AEROBIC AND ANAEROBIC   Special Requests Blood Culture adequate volume  Final   Culture   Final    NO GROWTH 4 DAYS Performed at [facility], [address] [place], [place] [address]    Report Status PENDING  Incomplete  SARS Coronavirus 2 [facility] order, Performed in [place] hospital lab) Nasopharyngeal Nasopharyngeal Swab     Status: None   Collection Time: 07/19/19 12:50 PM   Specimen: Nasopharyngeal Swab  Result Value Ref Range Status   SARS Coronavirus 2 NEGATIVE NEGATIVE Final    Comment: (NOTE) If result is NEGATIVE SARS-CoV-2 target nucleic acids are NOT DETECTED. The SARS-CoV-2 RNA is generally detectable in upper and lower  respiratory specimens during the acute phase of infection. The lowest  concentration of SARS-CoV-2 viral copies this assay can detect is 250  copies / mL. A negative result does not preclude SARS-CoV-2 infection  and should not be used as the sole basis for treatment or other  patient management decisions.  A negative result may occur with  improper specimen collection / handling, submission of specimen other  than nasopharyngeal swab, presence of viral mutation(s) within the  areas targeted by this assay, and inadequate number of viral copies  (<250 copies / mL). A negative result must be combined with clinical  observations, patient history, and epidemiological information. If result is POSITIVE SARS-CoV-2 target nucleic acids are DETECTED. The SARS-CoV-2 RNA is generally detectable in upper and lower  respiratory specimens dur ing the acute phase of infection.  Positive  results are indicative of active infection with SARS-CoV-2.  Clinical   correlation with patient history and other diagnostic information is  necessary to determine patient infection status.  Positive results do  not rule out bacterial infection or co-infection with other viruses. If result is PRESUMPTIVE POSTIVE SARS-CoV-2 nucleic acids MAY BE PRESENT.   A presumptive positive result was obtained on the submitted specimen  and confirmed on repeat testing.  While 2019 novel coronavirus  (SARS-CoV-2) nucleic acids may be present in the submitted sample  additional confirmatory testing may be necessary for epidemiological  and / or clinical management purposes  to differentiate between  SARS-CoV-2 and other Sarbecovirus currently known to infect humans.  If clinically indicated additional testing with  an alternate test  methodology [phone]) is advised. The SARS-CoV-2 RNA is generally  detectable in upper and lower respiratory sp ecimens during the acute  phase of infection. The expected result is Negative. Fact Sheet for Patients:  StrictlyIdeas.no Fact Sheet for Healthcare Providers: BankingDealers.co.za This test is not yet approved or cleared by the [place] FDA and has been authorized for detection and/or diagnosis of SARS-CoV-2 by FDA under an Emergency Use Authorization (EUA).  This EUA will remain in effect (meaning this test can be used) for the duration of the COVID-19 declaration under Section 564(b)(1) of the Act, 21 U.S.C. section 360bbb-3(b)(1), unless the authorization is terminated or revoked sooner. Performed at [facility], [address] [place], [place] [address]      Radiology Studies: No results found.  Scheduled Meds: . bacitracin   Topical Once  . furosemide  40 mg Intravenous Once  . [START ON 07/24/2019] influenza vac split quadrivalent PF  0.5 mL Intramuscular Tomorrow-1000  . lidocaine (PF)  10 mL Infiltration Once  . montelukast  10 mg Oral Daily  . nortriptyline  25 mg  Oral QHS  . pantoprazole  40 mg Oral BID AC  . sertraline  100 mg Oral BID  . topiramate  50 mg Oral Daily   Continuous Infusions: . sodium chloride Stopped (07/22/19 1655)  . cefTRIAXone (ROCEPHIN)  IV 2 g (07/23/19 1614)  . heparin 2,950 Units/hr (07/23/19 1750)    LOS: 4 days   Roxan Hockey, MD [facility]  If 7PM-7AM, please contact night-coverage [email or web address]  07/23/2019, 6:50 PM

## 2019-07-23 NOTE — Plan of Care (Signed)

## 2019-07-23 NOTE — Progress Notes (Signed)
ANTICOAGULATION CONSULT NOTE - Follow Up Consult  Pharmacy Consult for Heparin (Coumadin on hold) Indication: recent DVT (05/24/19)  Allergies  Allergen Reactions  . Aspirin     Due to hx of stomach ulcers    Patient Measurements: Height: 5\' 5"  (165.1 cm) Weight: (!) 553 lb 5.7 oz (251 kg) IBW/kg (Calculated) : 57 Heparin Dosing Weight: 125 kg  Vital Signs: Temp: 98.3 F (36.8 C) (09/09 0833) Temp Source: Oral (09/09 0833) BP: 103/52 (09/09 1240) Pulse Rate: 116 (09/09 1425)  Labs: Recent Labs    07/21/19 0339  07/22/19 0426 07/22/19 2006 07/23/19 0419 07/23/19 1422  HGB 7.9*  --  7.5*  --  7.1*  --   HCT 27.0*  --  26.2*  --  23.7*  --   PLT 440*  --  422*  --  395  --   LABPROT 77.6*   < > 23.4* 20.0* 18.8*  --   INR 9.9*   < > 2.1* 1.7* 1.6*  --   HEPARINUNFRC  --   --   --   --  <0.10* 0.12*  CREATININE 0.88  --  2.08*  --  3.76*  --    < > = values in this interval not displayed.    Estimated Creatinine Clearance: 40.6 mL/min (A) (by C-G formula based on SCr of 3.76 mg/dL (H)).  Assessment:  43 yr old female on Coumadin PTA for hx DVT 05/24/19.  INR 8.1 on admit and Coumadin held. Vitamin K 5 mg PO given on 9/7.  IV heparin begun on 9/8 pm when INR down to 1.7.    Initial heparin level < 0.10 on 2000 units/hr and rate increased to 2600 units/hr. Level remains subtherapeutic (0.12).  Hgb down to 7.1, 1 unit PRBCs to be transfused. No bleeding reported.  Goal of Therapy:  Heparin level 0.3-0.7 units/ml Monitor platelets by anticoagulation protocol: Yes   Plan:   Increase heparin drip to 2950 units/hr.  Heparin level ~8 hrs after rate change.  Daily heparin level, PT/INR and CBC.  Coumadin on hold.  Arty Baumgartner, [place] Pager: [phone] or phone: [phone] 07/23/2019,3:32 PM

## 2019-07-24 ENCOUNTER — Inpatient Hospital Stay (HOSPITAL_COMMUNITY): Payer: Self-pay

## 2019-07-24 DIAGNOSIS — D500 Iron deficiency anemia secondary to blood loss (chronic): Secondary | ICD-10-CM

## 2019-07-24 DIAGNOSIS — I87001 Postthrombotic syndrome without complications of right lower extremity: Secondary | ICD-10-CM

## 2019-07-24 DIAGNOSIS — N179 Acute kidney failure, unspecified: Secondary | ICD-10-CM

## 2019-07-24 LAB — RETICULOCYTES
Immature Retic Fract: 19.2 % — ABNORMAL HIGH (ref 2.3–15.9)
RBC.: 2.81 MIL/uL — ABNORMAL LOW (ref 3.87–5.11)
Retic Count, Absolute: 32 10*3/uL (ref 19.0–186.0)
Retic Ct Pct: 1.1 % (ref 0.4–3.1)

## 2019-07-24 LAB — FERRITIN: Ferritin: 77 ng/mL (ref 11–307)

## 2019-07-24 LAB — BASIC METABOLIC PANEL
Anion gap: 14 (ref 5–15)
BUN: 39 mg/dL — ABNORMAL HIGH (ref 6–20)
CO2: 17 mmol/L — ABNORMAL LOW (ref 22–32)
Calcium: 8.3 mg/dL — ABNORMAL LOW (ref 8.9–10.3)
Chloride: 102 mmol/L (ref 98–111)
Creatinine, Ser: 5.22 mg/dL — ABNORMAL HIGH (ref 0.44–1.00)
GFR calc Af Amer: 11 mL/min — ABNORMAL LOW (ref >60)
GFR calc non Af Amer: 9 mL/min — ABNORMAL LOW (ref >60)
Glucose, Bld: 98 mg/dL (ref 70–99)
Potassium: 4.7 mmol/L (ref 3.5–5.1)
Sodium: 133 mmol/L — ABNORMAL LOW (ref 135–145)

## 2019-07-24 LAB — CBC
HCT: 25.4 % — ABNORMAL LOW (ref 36.0–46.0)
Hemoglobin: 7.6 g/dL — ABNORMAL LOW (ref 12.0–15.0)
MCH: 26.4 pg (ref 26.0–34.0)
MCHC: 29.9 g/dL — ABNORMAL LOW (ref 30.0–36.0)
MCV: 88.2 fL (ref 80.0–100.0)
Platelets: 458 10*3/uL — ABNORMAL HIGH (ref 150–400)
RBC: 2.88 MIL/uL — ABNORMAL LOW (ref 3.87–5.11)
RDW: 22.2 % — ABNORMAL HIGH (ref 11.5–15.5)
WBC: 17.2 10*3/uL — ABNORMAL HIGH (ref 4.0–10.5)
nRBC: 0 % (ref 0.0–0.2)

## 2019-07-24 LAB — CULTURE, BLOOD (ROUTINE X 2)
Culture: NO GROWTH
Culture: NO GROWTH
Special Requests: ADEQUATE
Special Requests: ADEQUATE

## 2019-07-24 LAB — IRON AND TIBC
Iron: 10 ug/dL — ABNORMAL LOW (ref 28–170)
Saturation Ratios: 5 % — ABNORMAL LOW (ref 10.4–31.8)
TIBC: 211 ug/dL — ABNORMAL LOW (ref 250–450)
UIBC: 201 ug/dL

## 2019-07-24 LAB — HEPARIN LEVEL (UNFRACTIONATED)
Heparin Unfractionated: <0.1 IU/mL — ABNORMAL LOW (ref 0.30–0.70)
Heparin Unfractionated: <0.1 IU/mL — ABNORMAL LOW (ref 0.30–0.70)
Heparin Unfractionated: 0.99 IU/mL — ABNORMAL HIGH (ref 0.30–0.70)
Heparin Unfractionated: 1.72 IU/mL — ABNORMAL HIGH (ref 0.30–0.70)

## 2019-07-24 LAB — PROTIME-INR
INR: 1.6 — ABNORMAL HIGH (ref 0.8–1.2)
Prothrombin Time: 19 seconds — ABNORMAL HIGH (ref 11.4–15.2)

## 2019-07-24 LAB — VITAMIN B12: Vitamin B-12: 832 pg/mL (ref 180–914)

## 2019-07-24 LAB — FOLATE: Folate: 11.2 ng/mL (ref >5.9)

## 2019-07-24 MED ORDER — SODIUM CHLORIDE 0.9 % IV SOLN
INTRAVENOUS | Status: DC
  Administered 2019-07-24 – 2019-07-26 (×3): via INTRAVENOUS

## 2019-07-24 MED ORDER — SODIUM CHLORIDE 0.9 % IV SOLN
510.0000 mg | Freq: Once | INTRAVENOUS | Status: AC
  Administered 2019-07-24: 510 mg via INTRAVENOUS
  Filled 2019-07-24: qty 17

## 2019-07-24 NOTE — Plan of Care (Signed)

## 2019-07-24 NOTE — Consult Note (Signed)
Referring Provider: No ref. provider found Primary Care Physician:  Larene Beach, MD Primary Nephrologist:     Reason for Consultation: Acute kidney injury, maintenance of euvolemia, assessment of acid-base and electrolyte abnormalities.  HPI: This is a 43 year old lady with history of morbid obesity recent right lower extremity DVT July 2020.  She been taking Coumadin she has a history of necrotizing fasciitis and debridements of the buttock and gluteal wound in 2018.  She presented 07/19/2019 with right lower extremity pain induration ecchymosis and tenderness of the medial aspect of thigh.  Not able to get a CT scan secondary to body habitus.  She appears to have some acute kidney injury and nephrology was consulted.  Blood pressure 77/33 pulse 100 temperature 98.1 O2 sats 95% room air  Urine output 100 cc.  Does not appear as much urine output collected since admission and 07/19/2019  Creatinine 07/21/2019 0.88 Creatinine 07/22/1999 2.08 Creatinine 07/23/2019 3.76 Creatinine 07/24/2019 5.22  Sodium 133 potassium 4.7 chloride 102 CO2 17 creatinine glucose 98 BUN 39 creatinine 5.226 calcium 8.3 iron saturations 5% WBC 17.2 hemoglobin 7.6 platelets 458  Singulair 10 mg daily norepinephrine 25 mg daily Protonix 40 mg twice daily Zoloft 100 mg twice daily Topamax 50 mg daily.  Zosyn 3.375 g every 8 hours started 07/19/2019- 07/22/2019 Vancomycin 1.5 g every 8 hours started 07/21/2019 -07/22/2019  Urinalysis pending  Renal ultrasound 07/24/2019 showed slight size discrepancy between the right and left kidney no hydronephrosis or focal masses.    Past Medical History:  Diagnosis Date  . Anemia   . Anxiety   . Asthma   . DVT (deep venous thrombosis) ([place])   . Gastric ulcer   . Headache   . Hiatal hernia   . Hypertension   . PE (pulmonary embolism)   . Tachycardia     Past Surgical History:  Procedure Laterality Date  . CESAREAN SECTION    . CHOLECYSTECTOMY    . ESOPHAGOGASTRODUODENOSCOPY  (EGD) WITH PROPOFOL N/A 05/25/2019   Procedure: ESOPHAGOGASTRODUODENOSCOPY (EGD) WITH PROPOFOL;  Surgeon: [name], MD;  Location: [facility] ENDOSCOPY;  Service: Endoscopy;  Laterality: N/A;  . INCISION AND DRAINAGE ABSCESS Left 02/16/2017   Procedure: DEBRIDEMENT LEFT BUTTOCK ABSCESS;  Surgeon: Fanny Skates, MD;  Location: [place];  Service: General;  Laterality: Left;  . IRRIGATION AND DEBRIDEMENT BUTTOCKS Left 02/19/2017   Procedure: DEBRIDEMENT OF GLUTEAL WOUND;  Surgeon: Rolm Bookbinder, MD;  Location: [place];  Service: General;  Laterality: Left;    Prior to Admission medications   Medication Sig Start Date End Date Taking? Authorizing Provider  acetaminophen (TYLENOL) 500 MG tablet Take 500 mg by mouth every 6 (six) hours as needed for moderate pain.   Yes [provider]  albuterol (VENTOLIN HFA) 108 (90 BASE) MCG/ACT inhaler Inhale 2 puffs into the lungs every 6 (six) hours as needed for wheezing.   Yes [provider]  diltiazem (TIAZAC) 180 MG 24 hr capsule Take 180 mg by mouth daily.   Yes [provider]  esomeprazole (NEXIUM) 20 MG capsule Take 20 mg by mouth daily at 12 noon.   Yes [provider]  ferrous sulfate 325 (65 FE) MG tablet Take 1 tablet (325 mg total) by mouth 2 (two) times daily with a meal. 06/03/19 08/02/19 Yes Amin, Ankit Chirag, MD  lisinopril (ZESTRIL) 20 MG tablet Take 20 mg by mouth daily.   Yes [provider]  montelukast (SINGULAIR) 10 MG tablet Take 10 mg by mouth daily.  Yes [provider]  nortriptyline (PAMELOR) 25 MG capsule Take 25 mg by mouth at bedtime.   Yes [provider]  ondansetron (ZOFRAN) 4 MG tablet Take 1 tablet (4 mg total) by mouth every 8 (eight) hours as needed for nausea or vomiting. 03/09/17  Yes Rai, Ripudeep K, MD  pantoprazole (PROTONIX) 40 MG tablet Take 1 tablet (40 mg total) by mouth 2 (two) times daily before a meal. 06/03/19 08/02/19 Yes Amin, Ankit Chirag, MD   sertraline (ZOLOFT) 100 MG tablet Take 1 tablet (100 mg total) by mouth 2 (two) times daily. 01/19/15  Yes Cloria Spring, MD  topiramate (TOPAMAX) 50 MG tablet Take 50 mg by mouth daily. 01/24/17  Yes [provider]  warfarin (COUMADIN) 10 MG tablet Take 1 tablet (10 mg total) by mouth daily at 6 PM for 30 doses. Patient taking differently: Take 10 mg by mouth daily at 6 PM. On Monday ,Wednesday and Friday and 5 mg on Tuesday Thursday, Saturday and Sunday 06/03/19 07/19/19 Yes Damita Lack, MD    Current Facility-Administered Medications  Medication Dose Route Frequency Provider Last Rate Last Dose  . 0.9 %  sodium chloride infusion   Intravenous Continuous Wendee Beavers T, MD 150 mL/hr at 07/24/19 1130    . acetaminophen (TYLENOL) tablet 650 mg  650 mg Oral Q6H PRN Kathie Dike, MD   650 mg at 07/23/19 2036   Or  . acetaminophen (TYLENOL) suppository 650 mg  650 mg Rectal Q6H PRN Kathie Dike, MD      . albuterol (PROVENTIL) (2.5 MG/3ML) 0.083% nebulizer solution 2.5 mg  2.5 mg Nebulization Q2H PRN Denton Brick, Courage, MD   2.5 mg at 07/23/19 2043  . ferumoxytol (FERAHEME) 510 mg in sodium chloride 0.9 % 100 mL IVPB  510 mg Intravenous Once Gonfa, Taye T, MD      . heparin ADULT infusion 100 units/mL (25000 units/22mL sodium chloride 0.45%)  3,300 Units/hr Intravenous Continuous Erenest Blank, RPH   Stopped at 07/24/19 1126  . influenza vac split quadrivalent PF (FLUARIX) injection 0.5 mL  0.5 mL Intramuscular Tomorrow-1000 Emokpae, Courage, MD      . montelukast (SINGULAIR) tablet 10 mg  10 mg Oral Daily Kathie Dike, MD   10 mg at 07/24/19 0921  . nortriptyline (PAMELOR) capsule 25 mg  25 mg Oral QHS Kathie Dike, MD   25 mg at 07/23/19 2035  . ondansetron (ZOFRAN) tablet 4 mg  4 mg Oral Q6H PRN Kathie Dike, MD       Or  . ondansetron (ZOFRAN) injection 4 mg  4 mg Intravenous Q6H PRN Kathie Dike, MD   4 mg at 07/24/19 0144  . oxyCODONE (Oxy IR/ROXICODONE)  immediate release tablet 5-10 mg  5-10 mg Oral Q4H PRN Bodenheimer, Charles A, NP   5 mg at 07/24/19 [id number]  . pantoprazole (PROTONIX) EC tablet 40 mg  40 mg Oral BID AC Kathie Dike, MD   40 mg at 07/24/19 0842  . sertraline (ZOLOFT) tablet 100 mg  100 mg Oral BID Kathie Dike, MD   100 mg at 07/24/19 0921  . sodium chloride flush (NS) 0.9 % injection 10-40 mL  10-40 mL Intracatheter PRN Kathie Dike, MD      . topiramate (TOPAMAX) tablet 50 mg  50 mg Oral Daily Kathie Dike, MD   50 mg at 07/24/19 [id number]    Allergies as of 07/19/2019 - Review Complete 07/19/2019  Allergen Reaction Noted  . Aspirin  02/15/2017  Family History  Problem Relation Age of Onset  . Depression Mother   . Alcohol abuse Mother     Social History   Socioeconomic History  . Marital status: Married    Spouse name: Not on file  . Number of children: Not on file  . Years of education: Not on file  . Highest education level: Not on file  Occupational History  . Not on file  Social Needs  . Financial resource strain: Not on file  . Food insecurity    Worry: Not on file    Inability: Not on file  . Transportation needs    Medical: Not on file    Non-medical: Not on file  Tobacco Use  . Smoking status: Former Smoker    Types: Cigarettes  . Smokeless tobacco: Never Used  . Tobacco comment: QUIT ABOUT 5 YEARS AGO  Substance and Sexual Activity  . Alcohol use: Yes    Comment: occa  . Drug use: No  . Sexual activity: Yes    Birth control/protection: None, Condom  Lifestyle  . Physical activity    Days per week: Not on file    Minutes per session: Not on file  . Stress: Not on file  Relationships  . Social Herbalist on phone: Not on file    Gets together: Not on file    Attends religious service: Not on file    Active member of club or organization: Not on file    Attends meetings of clubs or organizations: Not on file    Relationship status: Not on file  . Intimate partner  violence    Fear of current or ex partner: Not on file    Emotionally abused: Not on file    Physically abused: Not on file    Forced sexual activity: Not on file  Other Topics Concern  . Not on file  Social History Narrative  . Not on file    Review of Systems: Gen: Denies any fever, chills, sweats, anorexia, fatigue, weakness, malaise, weight loss, and sleep disorder HEENT: No visual complaints, No history of Retinopathy. Normal external appearance No Epistaxis or Sore throat. No sinusitis.   CV: Denies chest pain, angina, palpitations, syncope, orthopnea, PND, peripheral edema, and claudication. Resp: Denies dyspnea at rest, dyspnea with exercise, cough, sputum, wheezing, coughing up blood, and pleurisy. GI: Denies vomiting blood, jaundice, and fecal incontinence.   Denies dysphagia or odynophagia. GU : Denies urinary burning, blood in urine, urinary frequency, urinary hesitancy, nocturnal urination, and urinary incontinence.  No renal calculi. MS: Denies joint pain, limitation of movement, and swelling, stiffness, low back pain, extremity pain. Denies muscle weakness, cramps, atrophy.  No use of non steroidal antiinflammatory drugs. Derm: Denies rash, itching, dry skin, hives, moles, warts, or unhealing ulcers.  Psych: Denies depression, anxiety, memory loss, suicidal ideation, hallucinations, paranoia, and confusion. Heme: Denies bruising, bleeding, and enlarged lymph nodes. Neuro: No headache.  No diplopia. No dysarthria.  No dysphasia.  No history of CVA.  No Seizures. No paresthesias.  No weakness. Endocrine No DM.  No Thyroid disease.  No Adrenal disease.  Physical Exam: Vital signs in last 24 hours: Temp:  [97.9 F (36.6 C)-98.6 F (37 C)] 98.1 F (36.7 C) (09/10 0929) Pulse Rate:  [66-104] 100 (09/10 0929) Resp:  [15-16] 16 (09/10 0929) BP: (77-97)/(33-54) 77/33 (09/10 0929) SpO2:  [92 %-98 %] 95 % (09/10 0929) Last BM Date: 07/19/19 General:   Alert,  Well-developed,  well-nourished,  pleasant and cooperative in NAD Head:  Normocephalic and atraumatic. Eyes:  Sclera clear, no icterus.   Conjunctiva pink. Ears:  Normal auditory acuity. Nose:  No deformity, discharge,  or lesions. Mouth:  No deformity or lesions, dentition normal. Neck:  Supple; no masses or thyromegaly. JVP not elevated Lungs:  Clear throughout to auscultation.   No wheezes, crackles, or rhonchi. No acute distress. Heart:  Regular rate and rhythm; no murmurs, clicks, rubs,  or gallops. Abdomen:  Soft, nontender and nondistended. No masses, hepatosplenomegaly or hernias noted. Normal bowel sounds, without guarding, and without rebound.   Msk:  Symmetrical without gross deformities. Normal posture. Pulses:  No carotid, renal, femoral bruits. DP and PT symmetrical and equal Extremities:  Without clubbing or edema. Neurologic:  Alert and  oriented x4;  grossly normal neurologically. Skin: She has a skin wound located in the right thigh that appears hard and indurated purple and consistent with Coumadin necrosis.    Intake/Output from previous day: 09/09 0701 - 09/10 0700 In: 873.4 [P.O.:240; I.V.:459.9; IV Piggyback:173.4] Out: 100 [Urine:100] Intake/Output this shift: Total I/O In: 615.7 [P.O.:240; I.V.:375.7] Out: -   Lab Results: Recent Labs    07/22/19 0426 07/23/19 0419 07/24/19 0508  WBC 11.6* 13.9* 17.2*  HGB 7.5* 7.1* 7.6*  HCT 26.2* 23.7* 25.4*  PLT 422* 395 458*   BMET Recent Labs    07/22/19 0426 07/23/19 0419 07/24/19 0508  NA 135 132* 133*  K 4.2 4.4 4.7  CL 102 101 102  CO2 22 19* 17*  GLUCOSE 118* 98 98  BUN 23* 32* 39*  CREATININE 2.08* 3.76* 5.22*  CALCIUM 9.1 8.4* 8.3*   LFT No results for input(s): PROT, ALBUMIN, AST, ALT, ALKPHOS, BILITOT, BILIDIR, IBILI in the last 72 hours. PT/INR Recent Labs    07/23/19 0419 07/24/19 0508  LABPROT 18.8* 19.0*  INR 1.6* 1.6*   Hepatitis Panel No results for input(s): HEPBSAG, HCVAB, HEPAIGM, HEPBIGM in  the last 72 hours.  Studies/Results: US Renal  Result Date: 07/24/2019 CLINICAL DATA:  Acute kidney injury.  Hypertension. EXAM: RENAL / URINARY TRACT ULTRASOUND COMPLETE COMPARISON:  05/25/2019 FINDINGS: Right Kidney: Renal measurements: 13.4 x 5.4 x 6.4 cm = volume: 240 mL . Echogenicity within normal limits. No mass or hydronephrosis visualized. Left Kidney: Renal measurements: 10.8 x 5.6 x 5.8 cm = volume: 183 mL. Echogenicity within normal limits. No mass or hydronephrosis visualized. Bladder: Not visualized. IMPRESSION: Stable slight size discrepancy right kidney larger than left. No hydronephrosis or focal mass. Electronically Signed   By: Marin Olp M.D.   On: 07/24/2019 12:24    Assessment/Plan:  Acute kidney injury.  This seems to be in the setting of use of vancomycin high-dose.  I do not see any record of urine output.  There is no hydronephrosis on renal ultrasound.  That would rule out any sort of obstruction.  Better measurements of ins and outs.  Daily renal panel.  Urinalysis.  Differential would include acute tubular necrosis acute interstitial nephritis or acute glomerulonephritis.  She has morbid obesity may have some underlying obesity related FSGS.  Will check urine protein creatinine ratio.  Avoid nephrotoxins ACE inhibitor's ARB use nonsteroidal anti-inflammatory drugs.  Avoid IV contrast.  Avoid intra-arterial procedures.  Blood pressure appears to be low.  This could be responsible for acute tubular necrosis with poor renal perfusion.  It would be helpful to get better measurements of blood pressure.  Agree with discontinuation of vancomycin will check a vancomycin level.  She may have been toxic.  Hypotension.  Will check 2D echo serum cortisol in a.m. TSH  Calciphylaxis/Coumadin necrosis.  Aggressive wound care management  Morbid obesity    LOS: 5 Sherril Croon @TODAY @2 :45 PM

## 2019-07-24 NOTE — Progress Notes (Signed)
ANTICOAGULATION CONSULT NOTE - Follow Up Consult Pharmacy Consult for Heparin (warfarin on hold) Indication: recent DVT (05/24/19)  Allergies  Allergen Reactions  . Aspirin     Due to hx of stomach ulcers    Patient Measurements: Height: 5\' 5"  (165.1 cm) Weight: (!) 553 lb 5.7 oz (251 kg) IBW/kg (Calculated) : 57 Heparin Dosing Weight: 125 kg  Vital Signs: Temp: 98 F (36.7 C) (09/10 1540) Temp Source: Oral (09/10 1540) BP: 88/54 (09/10 1540) Pulse Rate: 102 (09/10 1540)  Labs: Recent Labs    07/22/19 0426 07/22/19 2006  07/23/19 0419 07/23/19 1422 07/24/19 0508 07/24/19 1450  HGB 7.5*  --   --  7.1*  --  7.6*  --   HCT 26.2*  --   --  23.7*  --  25.4*  --   PLT 422*  --   --  395  --  458*  --   LABPROT 23.4* 20.0*  --  18.8*  --  19.0*  --   INR 2.1* 1.7*  --  1.6*  --  1.6*  --   HEPARINUNFRC  --   --    < > <0.10* 0.12* <0.10* <0.10*  CREATININE 2.08*  --   --  3.76*  --  5.22*  --    < > = values in this interval not displayed.    Estimated Creatinine Clearance: 29.2 mL/min (A) (by C-G formula based on SCr of 5.22 mg/dL (H)).  Assessment:  43 yr old female on Coumadin PTA for hx DVT 05/24/19.  INR 8.1 on admit and Coumadin held. Vitamin K 5 mg PO given on 9/7.  IV heparin begun on 9/8 pm when INR down to 1.7.  Hep has been running all day but due to position of IV lots of beeping so level likely unreliable  rn to move to different iv site  Goal of Therapy:  Heparin level 0.3-0.7 units/ml Monitor platelets by anticoagulation protocol: Yes   Plan:  Continue heparin drip 3300 units/hr Re-check heparin level in 6 hrs before adjusting dose  Levester Fresh, PharmD, BCPS, BCCCP Clinical Pharmacist [phone]  Please check AMION for all [place] numbers  07/24/2019 4:07 PM

## 2019-07-24 NOTE — Progress Notes (Signed)
PROGRESS NOTE  Megan Ortiz [id number] DOB: 01-20-1976   PCP: Larene Beach, MD  Patient is from: Home  DOA: 07/19/2019 LOS: 5  Brief Narrative / Interim history: 43 year old female with morbid obesity, recent RLE DVT in 05/2019 on Coumadin and history of necrotizing fasciitis with debridement of buttock and gluteal wound in 2018 presenting with RLE pain, induration, ecchymosis and tenderness over the medial aspect.  Not able to obtain CT due to body habitus. Ultrasound imaging did not show any underlying fluid collections.    She was a started on vancomycin and Zosyn.  Developed renal failure with significant uptrend in her creatinine. Antibiotic de-escalated to IV Rocephin.  However, renal function continued to get worse.  On 9/10-nephrology consulted for assistance  Subjective: Patient had soft blood pressures overnight.  She was not symptomatic.  Unfortunately, her blood pressure is not reliable given her body habitus. This morning, she reports pain over the medial aspect of her right lower extremity.  She rates her pain as 6/10.  No other complaints.  She denies chest pain, dyspnea, nausea, vomiting, abdominal pain or UTI symptoms.  Objective: Vitals:   07/23/19 2009 07/23/19 2030 07/24/19 0352 07/24/19 0929  BP: (!) 96/43 (!) 97/51 (!) 90/49 (!) 77/33  Pulse: 95 98 81 100  Resp: 16 16 16 16   Temp: 98.3 F (36.8 C) 98.6 F (37 C) 97.9 F (36.6 C) 98.1 F (36.7 C)  TempSrc: Oral Oral Oral Oral  SpO2: 92% 97% 94% 95%  Weight:      Height:        Intake/Output Summary (Last 24 hours) at 07/24/2019 1255 Last data filed at 07/24/2019 1128 Gross per 24 hour  Intake 1249.01 ml  Output 100 ml  Net 1149.01 ml   Filed Weights   07/19/19 1102 07/19/19 1125 07/19/19 1350  Weight: (!) 181.4 kg (!) 181.4 kg (!) 251 kg    Examination:  GENERAL: No acute distress.  Morbidly obese. HEENT: MMM.  Vision and hearing grossly intact.  NECK: Supple.  No apparent JVD.  RESP:   No IWOB. Good air movement bilaterally. CVS:  RRR. Heart sounds normal.  ABD/GI/GU: Bowel sounds present. Soft. Non tender.  Further exam limited due to body habitus. MSK/EXT:  Moves extremities.  Skin color change over right lower extremity as below. SKIN: Some swelling, ecchymosis and erythema over the medial aspect of RLE.  No increased warmth to touch.  No significant tenderness.  See picture below for more. NEURO: Awake, alert and oriented appropriately.  No gross deficit.  PSYCH: Calm. Normal affect.   On 07/24/2019    On 07/19/2019      Assessment & Plan: Post thrombotic syndrome versus cellulitis of RLE: I think patient has post thrombotic syndrome from recent DVT.  She has no constitutional symptoms or leukocytosis on arrival although she is developing leukocytosis now. I do not see significant change with antibiotic so far.  Blood cultures negative. -We will discontinue antibiotics.  -Will obtain ABI before we try Ace wrap or compression  Right lower extremity DVT: Diagnosed in 05/2019.  Has been on Coumadin likely due to body habitus.  INR subtherapeutic. -On warfarin with heparin bridge per pharmacy.  AKI: suspect ATN from IV antibiotics (vancomycin and Zosyn).  Also received Lasix yesterday.  Also had some low blood pressures but not reliable due to her body habitus.  Renal ultrasound not impressive. -Cr 0.62 (admit)>> 2.08>> 5.22.  BUN 39. -IV normal saline at 100 cc an hour  -Obtain FENA -  Nephrology, Dr. Joelyn Oms consulted.  Symptomatic acute on chronic anemia: anemia panel favors iron deficiency with some element of anemia of chronic disease. -We will give IV Feraheme -Will hold of transfusion in case nephrology wants autoimmune labs.  GERD:  -On PPI.  This could potentially contribute to her AKI.  Mood disorder: Stable. -Continue home medication  DVT prophylaxis: On warfarin with heparin bridge for DVT treatment Code Status: Full code Family Communication:  Patient and/or RN. Available if any question.  Disposition Plan: Remains inpatient for AKI Consultants: Nephrology  Procedures:  None  Microbiology summarized: SARS-CoV-2 negative. Blood cultures negative.  Antimicrobials: Anti-infectives (From admission, onward)   Start     Dose/Rate Route Frequency Ordered Stop   07/22/19 1730  cefTRIAXone (ROCEPHIN) 2 g in sodium chloride 0.9 % 100 mL IVPB     2 g 200 mL/hr over 30 Minutes Intravenous Every 24 hours 07/22/19 1704     07/20/19 0615  vancomycin (VANCOCIN) 1,500 mg in sodium chloride 0.9 % 500 mL IVPB  Status:  Discontinued     1,500 mg 250 mL/hr over 120 Minutes Intravenous Every 8 hours 07/20/19 0610 07/22/19 0825   07/19/19 2200  Vancomycin (VANCOCIN) 1,500 mg in sodium chloride 0.9 % 500 mL IVPB  Status:  Discontinued     1,500 mg 250 mL/hr over 120 Minutes Intravenous Every 8 hours 07/19/19 1846 07/20/19 0606   07/19/19 2130  piperacillin-tazobactam (ZOSYN) IVPB 3.375 g  Status:  Discontinued     3.375 g 100 mL/hr over 30 Minutes Intravenous  Once 07/19/19 2122 07/19/19 2127   07/19/19 2130  vancomycin (VANCOCIN) IVPB 1000 mg/200 mL premix  Status:  Discontinued     1,000 mg 200 mL/hr over 60 Minutes Intravenous  Once 07/19/19 2122 07/19/19 2127   07/19/19 2130  piperacillin-tazobactam (ZOSYN) IVPB 3.375 g  Status:  Discontinued     3.375 g 12.5 mL/hr over 240 Minutes Intravenous Every 8 hours 07/19/19 1846 07/22/19 1704   07/19/19 1230  vancomycin (VANCOCIN) IVPB 1000 mg/200 mL premix     1,000 mg 200 mL/hr over 60 Minutes Intravenous Every 1 hr x 2 07/19/19 1215 07/19/19 1523   07/19/19 1230  piperacillin-tazobactam (ZOSYN) IVPB 3.375 g     3.375 g 12.5 mL/hr over 240 Minutes Intravenous  Once 07/19/19 1215 07/19/19 1323      Sch Meds:  Scheduled Meds:  influenza vac split quadrivalent PF  0.5 mL Intramuscular Tomorrow-1000   montelukast  10 mg Oral Daily   nortriptyline  25 mg Oral QHS   pantoprazole  40 mg  Oral BID AC   sertraline  100 mg Oral BID   topiramate  50 mg Oral Daily   Continuous Infusions:  sodium chloride 150 mL/hr at 07/24/19 1130   cefTRIAXone (ROCEPHIN)  IV 2 g (07/23/19 1614)   heparin Stopped (07/24/19 1126)   PRN Meds:.acetaminophen **OR** acetaminophen, albuterol, ondansetron **OR** ondansetron (ZOFRAN) IV, oxyCODONE, sodium chloride flush   I have personally reviewed the following labs and images: CBC: Recent Labs  Lab 07/20/19 0533 07/21/19 0339 07/22/19 0426 07/23/19 0419 07/24/19 0508  WBC 7.7 9.4 11.6* 13.9* 17.2*  HGB 8.3* 7.9* 7.5* 7.1* 7.6*  HCT 27.8* 27.0* 26.2* 23.7* 25.4*  MCV 86.1 88.2 89.1 88.4 88.2  PLT 433* 440* 422* 395 458*   BMP &GFR Recent Labs  Lab 07/20/19 0533 07/21/19 0339 07/22/19 0426 07/23/19 0419 07/24/19 0508  NA 134* 135 135 132* 133*  K 3.8 4.0 4.2 4.4 4.7  CL  101 103 102 101 102  CO2 21* 23 22 19* 17*  GLUCOSE 97 110* 118* 98 98  BUN 13 18 23* 32* 39*  CREATININE 0.72 0.88 2.08* 3.76* 5.22*  CALCIUM 9.3 9.5 9.1 8.4* 8.3*   Estimated Creatinine Clearance: 29.2 mL/min (A) (by C-G formula based on SCr of 5.22 mg/dL (H)). Liver & Pancreas: Recent Labs  Lab 07/19/19 1153  AST 11*  ALT 10  ALKPHOS 63  BILITOT 0.1*  PROT 8.5*  ALBUMIN 3.3*   No results for input(s): LIPASE, AMYLASE in the last 168 hours. No results for input(s): AMMONIA in the last 168 hours. Diabetic: No results for input(s): HGBA1C in the last 72 hours. No results for input(s): GLUCAP in the last 168 hours. Cardiac Enzymes: No results for input(s): CKTOTAL, CKMB, CKMBINDEX, TROPONINI in the last 168 hours. No results for input(s): PROBNP in the last 8760 hours. Coagulation Profile: Recent Labs  Lab 07/21/19 1622 07/22/19 0426 07/22/19 2006 07/23/19 0419 07/24/19 0508  INR 8.0* 2.1* 1.7* 1.6* 1.6*   Thyroid Function Tests: No results for input(s): TSH, T4TOTAL, FREET4, T3FREE, THYROIDAB in the last 72 hours. Lipid Profile: No  results for input(s): CHOL, HDL, LDLCALC, TRIG, CHOLHDL, LDLDIRECT in the last 72 hours. Anemia Panel: Recent Labs    07/24/19 0911  VITAMINB12 832  FOLATE 11.2  FERRITIN 77  TIBC 211*  IRON 10*  RETICCTPCT 1.1   Urine analysis:    Component Value Date/Time   COLORURINE AMBER (A) 05/27/2019 1300   APPEARANCEUR CLOUDY (A) 05/27/2019 1300   LABSPEC 1.032 (H) 05/27/2019 1300   PHURINE 5.0 05/27/2019 1300   GLUCOSEU NEGATIVE 05/27/2019 1300   HGBUR NEGATIVE 05/27/2019 1300   BILIRUBINUR NEGATIVE 05/27/2019 1300   KETONESUR 5 (A) 05/27/2019 1300   PROTEINUR NEGATIVE 05/27/2019 1300   UROBILINOGEN 0.2 05/11/2013 1903   NITRITE NEGATIVE 05/27/2019 1300   LEUKOCYTESUR NEGATIVE 05/27/2019 1300   Sepsis Labs: Invalid input(s): PROCALCITONIN, [place]  Microbiology: Recent Results (from the past 240 hour(s))  Blood culture (routine x 2)     Status: None   Collection Time: 07/19/19 11:52 AM   Specimen: Right Antecubital; Blood  Result Value Ref Range Status   Specimen Description   Final    RIGHT ANTECUBITAL BOTTLES DRAWN AEROBIC AND ANAEROBIC   Special Requests Blood Culture adequate volume  Final   Culture   Final    NO GROWTH 5 DAYS Performed at [facility], [address], [place], [place] [address]    Report Status 07/24/2019 FINAL  Final  Blood culture (routine x 2)     Status: None   Collection Time: 07/19/19 12:27 PM   Specimen: Left Antecubital; Blood  Result Value Ref Range Status   Specimen Description   Final    LEFT ANTECUBITAL BOTTLES DRAWN AEROBIC AND ANAEROBIC   Special Requests Blood Culture adequate volume  Final   Culture   Final    NO GROWTH 5 DAYS Performed at [facility], [address] [place], [place] [address]    Report Status 07/24/2019 FINAL  Final  SARS Coronavirus 2 [facility] order, Performed in [facility] hospital lab) Nasopharyngeal Nasopharyngeal Swab     Status: None   Collection Time: 07/19/19 12:50 PM   Specimen: Nasopharyngeal  Swab  Result Value Ref Range Status   SARS Coronavirus 2 NEGATIVE NEGATIVE Final    Comment: (NOTE) If result is NEGATIVE SARS-CoV-2 target nucleic acids are NOT DETECTED. The SARS-CoV-2 RNA is generally detectable in upper and lower  respiratory specimens during the acute phase of infection. The lowest  concentration of SARS-CoV-2 viral copies this assay can detect is 250  copies / mL. A negative result does not preclude SARS-CoV-2 infection  and should not be used as the sole basis for treatment or other  patient management decisions.  A negative result may occur with  improper specimen collection / handling, submission of specimen other  than nasopharyngeal swab, presence of viral mutation(s) within the  areas targeted by this assay, and inadequate number of viral copies  (<250 copies / mL). A negative result must be combined with clinical  observations, patient history, and epidemiological information. If result is POSITIVE SARS-CoV-2 target nucleic acids are DETECTED. The SARS-CoV-2 RNA is generally detectable in upper and lower  respiratory specimens dur ing the acute phase of infection.  Positive  results are indicative of active infection with SARS-CoV-2.  Clinical  correlation with patient history and other diagnostic information is  necessary to determine patient infection status.  Positive results do  not rule out bacterial infection or co-infection with other viruses. If result is PRESUMPTIVE POSTIVE SARS-CoV-2 nucleic acids MAY BE PRESENT.   A presumptive positive result was obtained on the submitted specimen  and confirmed on repeat testing.  While 2019 novel coronavirus  (SARS-CoV-2) nucleic acids may be present in the submitted sample  additional confirmatory testing may be necessary for epidemiological  and / or clinical management purposes  to differentiate between  SARS-CoV-2 and other Sarbecovirus currently known to infect humans.  If clinically indicated  additional testing with an alternate test  methodology [phone]) is advised. The SARS-CoV-2 RNA is generally  detectable in upper and lower respiratory sp ecimens during the acute  phase of infection. The expected result is Negative. Fact Sheet for Patients:  StrictlyIdeas.no Fact Sheet for Healthcare Providers: BankingDealers.co.za This test is not yet approved or cleared by the [place] FDA and has been authorized for detection and/or diagnosis of SARS-CoV-2 by FDA under an Emergency Use Authorization (EUA).  This EUA will remain in effect (meaning this test can be used) for the duration of the COVID-19 declaration under Section 564(b)(1) of the Act, 21 U.S.C. section 360bbb-3(b)(1), unless the authorization is terminated or revoked sooner. Performed at [facility], [address] [place], [place] [address]     Radiology Studies: US Renal  Result Date: 07/24/2019 CLINICAL DATA:  Acute kidney injury.  Hypertension. EXAM: RENAL / URINARY TRACT ULTRASOUND COMPLETE COMPARISON:  05/25/2019 FINDINGS: Right Kidney: Renal measurements: 13.4 x 5.4 x 6.4 cm = volume: 240 mL . Echogenicity within normal limits. No mass or hydronephrosis visualized. Left Kidney: Renal measurements: 10.8 x 5.6 x 5.8 cm = volume: 183 mL. Echogenicity within normal limits. No mass or hydronephrosis visualized. Bladder: Not visualized. IMPRESSION: Stable slight size discrepancy right kidney larger than left. No hydronephrosis or focal mass. Electronically Signed   By: Marin Olp M.D.   On: 07/24/2019 12:24    35 minutes with more than 50% spent in reviewing records, counseling patient and coordinating care.  Kyran Whittier T. Combes  If 7PM-7AM, please contact night-coverage [email or web address] Password [facility] 07/24/2019, 12:55 PM

## 2019-07-24 NOTE — Progress Notes (Signed)
ANTICOAGULATION CONSULT NOTE - Follow Up Consult Pharmacy Consult for Heparin (warfarin on hold) Indication: recent DVT (05/24/19)  Allergies  Allergen Reactions  . Aspirin     Due to hx of stomach ulcers    Patient Measurements: Height: 5\' 5"  (165.1 cm) Weight: (!) 553 lb 5.7 oz (251 kg) IBW/kg (Calculated) : 57 Heparin Dosing Weight: 125 kg  Vital Signs: Temp: 98.3 F (36.8 C) (09/10 1949) Temp Source: Oral (09/10 1949) BP: 88/41 (09/10 1949) Pulse Rate: 107 (09/10 1949)  Labs: Recent Labs    07/22/19 0426 07/22/19 2006 07/23/19 0419  07/24/19 0508 07/24/19 1450 07/24/19 1846 07/24/19 2243  HGB 7.5*  --  7.1*  --  7.6*  --   --   --   HCT 26.2*  --  23.7*  --  25.4*  --   --   --   PLT 422*  --  395  --  458*  --   --   --   LABPROT 23.4* 20.0* 18.8*  --  19.0*  --   --   --   INR 2.1* 1.7* 1.6*  --  1.6*  --   --   --   HEPARINUNFRC  --   --  <0.10*   < > <0.10* <0.10* 0.99* 1.72*  CREATININE 2.08*  --  3.76*  --  5.22*  --   --   --    < > = values in this interval not displayed.    Estimated Creatinine Clearance: 29.2 mL/min (A) (by C-G formula based on SCr of 5.22 mg/dL (H)).  Assessment:  43 yr old female on Coumadin PTA for hx DVT 05/24/19.  INR 8.1 on admit and Coumadin held. Vitamin K 5 mg PO given on 9/7.  IV heparin begun on 9/8 pm when INR down to 1.7.   Initial heparin level < 0.10 on 2000 units/hr and rate increased to 2600 units/hr. Level remains subtherapeutic (0.12).  Hgb down to 7.1, 1 unit PRBCs to be transfused. No bleeding reported.  9/11 AM update:  -Heparin level elevated -Lab drawn from mid-line and heparin running peripherally -Heparin is now running through a newly placed peripheral IV-previous peripheral IV may have not been working-could explain high level  Goal of Therapy:  Heparin level 0.3-0.7 units/ml Monitor platelets by anticoagulation protocol: Yes   Plan:  -Hold heparin x 1 hr -Re-start heparin drip at 2900 units/hr at  0115 -Re-check heparin level at 0930 -IV team consult placed to drawn from mid-line   Narda Bonds, PharmD, [place] Pharmacist Phone: [phone]

## 2019-07-24 NOTE — Progress Notes (Signed)
ANTICOAGULATION CONSULT NOTE - Follow Up Consult Pharmacy Consult for Heparin (warfarin on hold) Indication: recent DVT (05/24/19)  Allergies  Allergen Reactions  . Aspirin     Due to hx of stomach ulcers    Patient Measurements: Height: 5\' 5"  (165.1 cm) Weight: (!) 553 lb 5.7 oz (251 kg) IBW/kg (Calculated) : 57 Heparin Dosing Weight: 125 kg  Vital Signs: Temp: 97.9 F (36.6 C) (09/10 0352) Temp Source: Oral (09/10 0352) BP: 90/49 (09/10 0352) Pulse Rate: 81 (09/10 0352)  Labs: Recent Labs    07/22/19 0426 07/22/19 2006 07/23/19 0419 07/23/19 1422 07/24/19 0508  HGB 7.5*  --  7.1*  --  7.6*  HCT 26.2*  --  23.7*  --  25.4*  PLT 422*  --  395  --  458*  LABPROT 23.4* 20.0* 18.8*  --  19.0*  INR 2.1* 1.7* 1.6*  --  1.6*  HEPARINUNFRC  --   --  <0.10* 0.12* <0.10*  CREATININE 2.08*  --  3.76*  --  5.22*    Estimated Creatinine Clearance: 29.2 mL/min (A) (by C-G formula based on SCr of 5.22 mg/dL (H)).  Assessment:  43 yr old female on Coumadin PTA for hx DVT 05/24/19.  INR 8.1 on admit and Coumadin held. Vitamin K 5 mg PO given on 9/7.  IV heparin begun on 9/8 pm when INR down to 1.7.   Initial heparin level < 0.10 on 2000 units/hr and rate increased to 2600 units/hr. Level remains subtherapeutic (0.12).  Hgb down to 7.1, 1 unit PRBCs to be transfused. No bleeding reported.  9/10 AM update:  Heparin level undetectable  No issues per RN  Goal of Therapy:  Heparin level 0.3-0.7 units/ml Monitor platelets by anticoagulation protocol: Yes   Plan:  Increase heparin drip to 3300 units/hr Re-check heparin level in 8 hours  Narda Bonds, PharmD, [place] Pharmacist Phone: [phone]

## 2019-07-25 ENCOUNTER — Inpatient Hospital Stay (HOSPITAL_COMMUNITY): Payer: Self-pay

## 2019-07-25 ENCOUNTER — Inpatient Hospital Stay (HOSPITAL_COMMUNITY): Payer: Self-pay | Admitting: Certified Registered"

## 2019-07-25 ENCOUNTER — Encounter (HOSPITAL_COMMUNITY)
Admission: EM | Disposition: A | Discharge status: Skilled Nursing Facility | Payer: Self-pay | Source: Home / Self Care | Attending: Family Medicine

## 2019-07-25 ENCOUNTER — Encounter (HOSPITAL_COMMUNITY): Payer: Self-pay | Admitting: *Deleted

## 2019-07-25 DIAGNOSIS — M7989 Other specified soft tissue disorders: Secondary | ICD-10-CM

## 2019-07-25 DIAGNOSIS — N179 Acute kidney failure, unspecified: Secondary | ICD-10-CM

## 2019-07-25 DIAGNOSIS — M79604 Pain in right leg: Secondary | ICD-10-CM

## 2019-07-25 HISTORY — PX: INSERTION OF DIALYSIS CATHETER: SHX1324

## 2019-07-25 LAB — CBC
HCT: 24.6 % — ABNORMAL LOW (ref 36.0–46.0)
Hemoglobin: 7.2 g/dL — ABNORMAL LOW (ref 12.0–15.0)
MCH: 26.4 pg (ref 26.0–34.0)
MCHC: 29.3 g/dL — ABNORMAL LOW (ref 30.0–36.0)
MCV: 90.1 fL (ref 80.0–100.0)
Platelets: 467 10*3/uL — ABNORMAL HIGH (ref 150–400)
RBC: 2.73 MIL/uL — ABNORMAL LOW (ref 3.87–5.11)
RDW: 22.7 % — ABNORMAL HIGH (ref 11.5–15.5)
WBC: 16.7 10*3/uL — ABNORMAL HIGH (ref 4.0–10.5)
nRBC: 0.1 % (ref 0.0–0.2)

## 2019-07-25 LAB — URINALYSIS, COMPLETE (UACMP) WITH MICROSCOPIC
Bilirubin Urine: NEGATIVE
Glucose, UA: NEGATIVE mg/dL
Ketones, ur: NEGATIVE mg/dL
Leukocytes,Ua: NEGATIVE
Nitrite: NEGATIVE
Protein, ur: 100 mg/dL — AB
Specific Gravity, Urine: 1.019 (ref 1.005–1.030)
pH: 5 (ref 5.0–8.0)

## 2019-07-25 LAB — RENAL FUNCTION PANEL
Albumin: 2.2 g/dL — ABNORMAL LOW (ref 3.5–5.0)
Anion gap: 13 (ref 5–15)
BUN: 45 mg/dL — ABNORMAL HIGH (ref 6–20)
CO2: 16 mmol/L — ABNORMAL LOW (ref 22–32)
Calcium: 8.4 mg/dL — ABNORMAL LOW (ref 8.9–10.3)
Chloride: 105 mmol/L (ref 98–111)
Creatinine, Ser: 6.21 mg/dL — ABNORMAL HIGH (ref 0.44–1.00)
GFR calc Af Amer: 9 mL/min — ABNORMAL LOW (ref >60)
GFR calc non Af Amer: 8 mL/min — ABNORMAL LOW (ref >60)
Glucose, Bld: 93 mg/dL (ref 70–99)
Phosphorus: 7.1 mg/dL — ABNORMAL HIGH (ref 2.5–4.6)
Potassium: 5 mmol/L (ref 3.5–5.1)
Sodium: 134 mmol/L — ABNORMAL LOW (ref 135–145)

## 2019-07-25 LAB — PROTIME-INR
INR: 1.8 — ABNORMAL HIGH (ref 0.8–1.2)
Prothrombin Time: 20.2 seconds — ABNORMAL HIGH (ref 11.4–15.2)

## 2019-07-25 LAB — CREATININE, URINE, RANDOM: Creatinine, Urine: 120.47 mg/dL

## 2019-07-25 LAB — TSH: TSH: 1.317 u[IU]/mL (ref 0.350–4.500)

## 2019-07-25 LAB — SURGICAL PCR SCREEN
MRSA, PCR: NEGATIVE
Staphylococcus aureus: NEGATIVE

## 2019-07-25 LAB — HEPARIN LEVEL (UNFRACTIONATED): Heparin Unfractionated: 0.54 IU/mL (ref 0.30–0.70)

## 2019-07-25 LAB — VANCOMYCIN, RANDOM: Vancomycin Rm: 52

## 2019-07-25 LAB — SODIUM, URINE, RANDOM: Sodium, Ur: 57 mmol/L

## 2019-07-25 LAB — CORTISOL: Cortisol, Plasma: 16.1 ug/dL

## 2019-07-25 SURGERY — INSERTION OF DIALYSIS CATHETER
Anesthesia: General | Site: Chest | Laterality: Right

## 2019-07-25 MED ORDER — ALTEPLASE 2 MG IJ SOLR: 2.0000 mg | Freq: Once | INTRAMUSCULAR | Status: DC | PRN

## 2019-07-25 MED ORDER — LIDOCAINE 2% (20 MG/ML) 5 ML SYRINGE
INTRAMUSCULAR | Status: DC | PRN
  Administered 2019-07-25: 40 mg via INTRAVENOUS

## 2019-07-25 MED ORDER — ALBUMIN HUMAN 5 % IV SOLN
INTRAVENOUS | Status: AC
  Administered 2019-07-25: 19:00:00 12.5 g via INTRAVENOUS
  Filled 2019-07-25: qty 250

## 2019-07-25 MED ORDER — PROPOFOL 10 MG/ML IV BOLUS
INTRAVENOUS | Status: DC | PRN
  Administered 2019-07-25: 250 mg via INTRAVENOUS

## 2019-07-25 MED ORDER — HEPARIN SODIUM (PORCINE) 1000 UNIT/ML IJ SOLN
INTRAMUSCULAR | Status: AC
  Administered 2019-07-25: 3000 [IU] via INTRAVENOUS_CENTRAL
  Filled 2019-07-25: qty 3

## 2019-07-25 MED ORDER — FENTANYL CITRATE (PF) 250 MCG/5ML IJ SOLN
INTRAMUSCULAR | Status: AC
  Filled 2019-07-25: qty 5

## 2019-07-25 MED ORDER — SUCCINYLCHOLINE CHLORIDE 20 MG/ML IJ SOLN
INTRAMUSCULAR | Status: DC | PRN
  Administered 2019-07-25: 140 mg via INTRAVENOUS

## 2019-07-25 MED ORDER — EPHEDRINE SULFATE-NACL 50-0.9 MG/10ML-% IV SOSY
PREFILLED_SYRINGE | INTRAVENOUS | Status: DC | PRN
  Administered 2019-07-25 (×2): 10 mg via INTRAVENOUS

## 2019-07-25 MED ORDER — PROPOFOL 10 MG/ML IV BOLUS
INTRAVENOUS | Status: AC
  Filled 2019-07-25: qty 20

## 2019-07-25 MED ORDER — FENTANYL CITRATE (PF) 250 MCG/5ML IJ SOLN
INTRAMUSCULAR | Status: DC | PRN
  Administered 2019-07-25: 50 ug via INTRAVENOUS

## 2019-07-25 MED ORDER — ROCURONIUM BROMIDE 10 MG/ML (PF) SYRINGE
PREFILLED_SYRINGE | INTRAVENOUS | Status: AC
  Filled 2019-07-25: qty 10

## 2019-07-25 MED ORDER — HEPARIN SODIUM (PORCINE) 1000 UNIT/ML IJ SOLN
INTRAMUSCULAR | Status: AC
  Filled 2019-07-25: qty 1

## 2019-07-25 MED ORDER — CEFAZOLIN SODIUM-DEXTROSE 2-4 GM/100ML-% IV SOLN
2.0000 g | Freq: Two times a day (BID) | INTRAVENOUS | Status: DC
  Administered 2019-07-25: 2 g via INTRAVENOUS
  Filled 2019-07-25: qty 100

## 2019-07-25 MED ORDER — ALBUMIN HUMAN 5 % IV SOLN
12.5000 g | Freq: Once | INTRAVENOUS | Status: AC
  Administered 2019-07-25: 19:00:00 12.5 g via INTRAVENOUS

## 2019-07-25 MED ORDER — MIDAZOLAM HCL 2 MG/2ML IJ SOLN
INTRAMUSCULAR | Status: AC
  Filled 2019-07-25: qty 2

## 2019-07-25 MED ORDER — MIDAZOLAM HCL 5 MG/5ML IJ SOLN
INTRAMUSCULAR | Status: DC | PRN
  Administered 2019-07-25: 1 mg via INTRAVENOUS

## 2019-07-25 MED ORDER — PROMETHAZINE HCL 25 MG/ML IJ SOLN
INTRAMUSCULAR | Status: AC
  Administered 2019-07-25: 6.25 mg via INTRAVENOUS
  Filled 2019-07-25: qty 1

## 2019-07-25 MED ORDER — PENTAFLUOROPROP-TETRAFLUOROETH EX AERO: 1.0000 "application " | INHALATION_SPRAY | CUTANEOUS | Status: DC | PRN

## 2019-07-25 MED ORDER — PHENYLEPHRINE 40 MCG/ML (10ML) SYRINGE FOR IV PUSH (FOR BLOOD PRESSURE SUPPORT)
PREFILLED_SYRINGE | INTRAVENOUS | Status: DC | PRN
  Administered 2019-07-25 (×3): 120 ug via INTRAVENOUS
  Administered 2019-07-25: 40 ug via INTRAVENOUS

## 2019-07-25 MED ORDER — PROMETHAZINE HCL 25 MG/ML IJ SOLN
6.2500 mg | INTRAMUSCULAR | Status: DC | PRN
  Administered 2019-07-25: 18:00:00 6.25 mg via INTRAVENOUS

## 2019-07-25 MED ORDER — CEFAZOLIN SODIUM-DEXTROSE 2-4 GM/100ML-% IV SOLN: 2.0000 g | Freq: Two times a day (BID) | INTRAVENOUS | Status: DC

## 2019-07-25 MED ORDER — SODIUM CHLORIDE 0.9 % IV SOLN
INTRAVENOUS | Status: DC | PRN
  Administered 2019-07-25: 17:00:00 500 mL

## 2019-07-25 MED ORDER — DEXAMETHASONE SODIUM PHOSPHATE 10 MG/ML IJ SOLN
INTRAMUSCULAR | Status: AC
  Filled 2019-07-25: qty 1

## 2019-07-25 MED ORDER — LIDOCAINE HCL (PF) 1 % IJ SOLN: 5.0000 mL | INTRAMUSCULAR | Status: DC | PRN

## 2019-07-25 MED ORDER — HEPARIN SODIUM (PORCINE) 1000 UNIT/ML DIALYSIS
1000.0000 [IU] | INTRAMUSCULAR | Status: DC | PRN
  Administered 2019-07-25: 3000 [IU] via INTRAVENOUS_CENTRAL
  Filled 2019-07-25: qty 1

## 2019-07-25 MED ORDER — FENTANYL CITRATE (PF) 100 MCG/2ML IJ SOLN: 25.0000 ug | INTRAMUSCULAR | Status: DC | PRN

## 2019-07-25 MED ORDER — CEFAZOLIN SODIUM-DEXTROSE 2-4 GM/100ML-% IV SOLN
2.0000 g | INTRAVENOUS | Status: DC
  Administered 2019-07-25: 2 g via INTRAVENOUS
  Administered 2019-07-25: 17:00:00 3 g via INTRAVENOUS
  Administered 2019-07-26 – 2019-07-30 (×5): 2 g via INTRAVENOUS
  Filled 2019-07-25 (×6): qty 100

## 2019-07-25 MED ORDER — SODIUM CHLORIDE 0.9 % IV SOLN: 100.0000 mL | INTRAVENOUS | Status: DC | PRN

## 2019-07-25 MED ORDER — CHLORHEXIDINE GLUCONATE CLOTH 2 % EX PADS
6.0000 | MEDICATED_PAD | Freq: Every day | CUTANEOUS | Status: DC
  Administered 2019-07-25 – 2019-08-04 (×8): 6 via TOPICAL

## 2019-07-25 MED ORDER — HEPARIN (PORCINE) 25000 UT/250ML-% IV SOLN
3950.0000 [IU]/h | INTRAVENOUS | Status: DC
  Administered 2019-07-25 – 2019-07-27 (×8): 2900 [IU]/h via INTRAVENOUS
  Administered 2019-07-28: 3300 [IU]/h via INTRAVENOUS
  Administered 2019-07-28: 02:00:00 2900 [IU]/h via INTRAVENOUS
  Administered 2019-07-28: 19:00:00 3600 [IU]/h via INTRAVENOUS
  Administered 2019-07-29: 02:00:00 3950 [IU]/h via INTRAVENOUS
  Filled 2019-07-25 (×14): qty 250

## 2019-07-25 MED ORDER — SODIUM CHLORIDE 0.9 % IV SOLN
INTRAVENOUS | Status: DC | PRN
  Administered 2019-07-25: 17:00:00 40 ug/min via INTRAVENOUS

## 2019-07-25 MED ORDER — HEPARIN SODIUM (PORCINE) 1000 UNIT/ML IJ SOLN
INTRAMUSCULAR | Status: DC | PRN
  Administered 2019-07-25: 3000 [IU]

## 2019-07-25 MED ORDER — LIDOCAINE-PRILOCAINE 2.5-2.5 % EX CREA: 1.0000 "application " | TOPICAL_CREAM | CUTANEOUS | Status: DC | PRN

## 2019-07-25 MED ORDER — HEPARIN (PORCINE) 25000 UT/250ML-% IV SOLN: 2900.0000 [IU]/h | INTRAVENOUS | Status: DC

## 2019-07-25 MED ORDER — SODIUM CHLORIDE 0.9 % IV SOLN
INTRAVENOUS | Status: DC | PRN
  Administered 2019-07-25: 17:00:00 via INTRAVENOUS

## 2019-07-25 MED ORDER — LIDOCAINE-EPINEPHRINE (PF) 1 %-1:200000 IJ SOLN
INTRAMUSCULAR | Status: AC
  Filled 2019-07-25: qty 30

## 2019-07-25 MED ORDER — CEFAZOLIN SODIUM-DEXTROSE 2-4 GM/100ML-% IV SOLN: 2.0000 g | INTRAVENOUS | Status: DC

## 2019-07-25 SURGICAL SUPPLY — 38 items
BAG DECANTER FOR FLEXI CONT (MISCELLANEOUS) ×3 IMPLANT
BIOPATCH RED 1 DISK 7.0 (GAUZE/BANDAGES/DRESSINGS) ×2 IMPLANT
BIOPATCH RED 1IN DISK 7.0MM (GAUZE/BANDAGES/DRESSINGS) ×1
CATH PALINDROME RT-P 15FX19CM (CATHETERS) ×3 IMPLANT
CATH PALINDROME RT-P 15FX23CM (CATHETERS) IMPLANT
CATH PALINDROME RT-P 15FX28CM (CATHETERS) IMPLANT
CATH PALINDROME RT-P 15FX55CM (CATHETERS) IMPLANT
COVER PROBE W GEL 5X96 (DRAPES) ×3 IMPLANT
COVER SURGICAL LIGHT HANDLE (MISCELLANEOUS) ×3 IMPLANT
COVER WAND RF STERILE (DRAPES) ×3 IMPLANT
DERMABOND ADVANCED (GAUZE/BANDAGES/DRESSINGS) ×2
DERMABOND ADVANCED .7 DNX12 (GAUZE/BANDAGES/DRESSINGS) ×1 IMPLANT
DRAPE C-ARM 42X72 X-RAY (DRAPES) ×3 IMPLANT
DRAPE CHEST BREAST 15X10 FENES (DRAPES) ×3 IMPLANT
GAUZE 4X4 16PLY RFD (DISPOSABLE) ×3 IMPLANT
GLOVE BIO SURGEON STRL SZ7.5 (GLOVE) ×3 IMPLANT
GOWN STRL REUS W/ TWL LRG LVL3 (GOWN DISPOSABLE) ×1 IMPLANT
GOWN STRL REUS W/ TWL XL LVL3 (GOWN DISPOSABLE) ×1 IMPLANT
GOWN STRL REUS W/TWL LRG LVL3 (GOWN DISPOSABLE) ×2
GOWN STRL REUS W/TWL XL LVL3 (GOWN DISPOSABLE) ×2
HOVERMATT SINGLE USE (MISCELLANEOUS) ×3 IMPLANT
KIT BASIN OR (CUSTOM PROCEDURE TRAY) ×3 IMPLANT
KIT TURNOVER KIT B (KITS) ×3 IMPLANT
NEEDLE 18GX1X1/2 (RX/OR ONLY) (NEEDLE) ×3 IMPLANT
NEEDLE HYPO 25GX1X1/2 BEV (NEEDLE) ×3 IMPLANT
NS IRRIG 1000ML POUR BTL (IV SOLUTION) ×3 IMPLANT
PACK SURGICAL SETUP 50X90 (CUSTOM PROCEDURE TRAY) ×3 IMPLANT
PAD ARMBOARD 7.5X6 YLW CONV (MISCELLANEOUS) ×6 IMPLANT
SOAP 2 % CHG 4 OZ (WOUND CARE) ×3 IMPLANT
SUT ETHILON 3 0 PS 1 (SUTURE) ×3 IMPLANT
SUT MNCRL AB 4-0 PS2 18 (SUTURE) ×3 IMPLANT
SYR 10ML LL (SYRINGE) ×3 IMPLANT
SYR 20ML LL LF (SYRINGE) ×6 IMPLANT
SYR 5ML LL (SYRINGE) ×3 IMPLANT
SYR CONTROL 10ML LL (SYRINGE) ×3 IMPLANT
TOWEL GREEN STERILE (TOWEL DISPOSABLE) ×3 IMPLANT
TOWEL GREEN STERILE FF (TOWEL DISPOSABLE) ×6 IMPLANT
WATER STERILE IRR 1000ML POUR (IV SOLUTION) ×3 IMPLANT

## 2019-07-25 NOTE — Plan of Care (Signed)
  Problem: Elimination: Goal: Will not experience complications related to bowel motility Outcome: Progressing Goal: Will not experience complications related to urinary retention Outcome: Progressing   

## 2019-07-25 NOTE — Progress Notes (Signed)
ANTICOAGULATION + ANTIBIOTIC CONSULT NOTE - Follow Up Consult  Pharmacy Consult for Heparin (warfarin on hold); Cefazolin Indication: recent DVT (05/24/19); cellulitis  Allergies  Allergen Reactions  . Aspirin     Due to hx of stomach ulcers    Patient Measurements: Height: 5\' 5"  (165.1 cm) Weight: (!) 553 lb 5.7 oz (251 kg) IBW/kg (Calculated) : 57 Heparin Dosing Weight: 125 kg  Vital Signs: Temp: 98.6 F (37 C) (09/11 0842) Temp Source: Oral (09/11 0842) BP: 119/104 (09/11 0842) Pulse Rate: 110 (09/11 0842)  Labs: Recent Labs    07/23/19 0419  07/24/19 0508  07/24/19 1846 07/24/19 2243 07/25/19 0347 07/25/19 0930  HGB 7.1*  --  7.6*  --   --   --   --  7.2*  HCT 23.7*  --  25.4*  --   --   --   --  24.6*  PLT 395  --  458*  --   --   --   --  467*  LABPROT 18.8*  --  19.0*  --   --   --  20.2*  --   INR 1.6*  --  1.6*  --   --   --  1.8*  --   HEPARINUNFRC <0.10*   < > <0.10*   < > 0.99* 1.72*  --  0.54  CREATININE 3.76*  --  5.22*  --   --   --  6.21*  --    < > = values in this interval not displayed.    Estimated Creatinine Clearance: 24.6 mL/min (A) (by C-G formula based on SCr of 6.21 mg/dL (H)).  Assessment:  43 yr old female on Coumadin PTA for hx DVT 05/24/19.  INR 8.1 on admit and Coumadin held. Vitamin K 5 mg PO given on 9/7.  IV heparin begun on 9/8 pm when INR down to 1.7.   Initial heparin levels low and rate adjusted x 2,  then IV site changed on 9/10 due to lots of IV beeping and levels likely unreliable due to IV position.  Last heparin level was supratherapeutic (1.72) drawn via mid-line with heparin running peripherally. Prior low levels possibly due to previous peripheral site not working.  Drip held for 1 hr overnight and now running at 2900 units/hr.  Heparin level this morning is therapeutic (0.54).       Cefazolin to begin for cellulitis.  AKI. Creatinine up to 6.21.  Random Vanc level 52 mcg/ml. Last Vancomycin dose 6am on 9/8. Initially on  Vanc and Zosyn then changed to Ceftriaxone, which was discontinued yesterday. Last Ceftriaxone dose 9/9 at 4pm.   Vancomycin9/5 >>9/8  Zosyn9/5 >>9/8  Ceftriaxone 9/8>>9/10  Cefazolin 9/11>>  Goal of Therapy:  Heparin level 0.3-0.7 units/ml Monitor platelets by anticoagulation protocol: Yes  Appropriate Cefazolin dose for renal function and indication   Plan:   Continue heparin drip at 2900 units/hr   Will follow up post-op TDC placement for resuming IV heparin  Daily heparin level, PT/INR and CBC  Prior pharmacist placed IV team consult placed to drawn heparin levels from mid-line.   Cefazolin 2gm IV this am, then plan 2 gm IV q24hr, next dose after HD since removed by HD.  Follow renal function, clinical progress, antibiotic plans.  Follow up vancomycin level after dialysis.  Arty Baumgartner, [place] Pager: [phone] of phone: [phone] 07/25/2019 12:20 PM

## 2019-07-25 NOTE — Progress Notes (Signed)
PROGRESS NOTE  Megan Ortiz [id number] DOB: 10-29-1976   PCP: Larene Beach, MD  Patient is from: Home  DOA: 07/19/2019 LOS: 6  Brief Narrative / Interim history: 43 year old female with morbid obesity, recent RLE DVT in 05/2019 on Coumadin and history of necrotizing fasciitis with debridement of buttock and gluteal wound in 2018 presenting with RLE pain, induration, ecchymosis and tenderness over the medial aspect.  Not able to obtain CT due to body habitus. Ultrasound imaging did not show any underlying fluid collections.    She was a started on vancomycin and Zosyn.  Developed renal failure with significant uptrend in her creatinine. Antibiotic de-escalated to IV Rocephin.  However, renal function continued to get worse.  On 9/10-nephrology consulted for assistance.  9/11 vascular surgery consulted.  Subjective: Continues to have some soft blood pressures but not symptomatic.  Unclear if her blood pressures are reliable due to her body habitus.  They have been checking gait on her wrist and leg.  Pain about the same.  She thinks the redness has gotten worse.  No other complaints.  Objective: Vitals:   07/24/19 1507 07/24/19 1540 07/24/19 1949 07/25/19 0842  BP:  (!) 88/54 (!) 88/41 (!) 119/104  Pulse: 98 (!) 102 (!) 107 (!) 110  Resp:  17 18 17   Temp:  98 F (36.7 C) 98.3 F (36.8 C) 98.6 F (37 C)  TempSrc:  Oral Oral Oral  SpO2:  93% 95% 92%  Weight:      Height:        Intake/Output Summary (Last 24 hours) at 07/25/2019 1308 Last data filed at 07/25/2019 0900 Gross per 24 hour  Intake 1621.55 ml  Output -  Net 1621.55 ml   Filed Weights   07/19/19 1102 07/19/19 1125 07/19/19 1350  Weight: (!) 181.4 kg (!) 181.4 kg (!) 251 kg    Examination:   GENERAL: No acute distress.  Morbidly obese. HEENT: MMM.  Vision and hearing grossly intact.  NECK: Supple.  No apparent JVD.  RESP:  No IWOB.  Fair air movement bilaterally but limited exam due to body habitus.  CVS:  RRR. Heart sounds normal.  ABD/GI/GU: Bowel sounds present. Soft. Non tender.  MSK/EXT:  Moves extremities.  Skin color change as below. SKIN: Right lower extremity erythema with ecchymosis over the medial aspect from ankle to groin.  Tenderness to palpation.  No increased warmth to touch.  See picture below. NEURO: Awake, alert and oriented appropriately.  No gross deficit.  PSYCH: Calm. Normal affect.   On 07/25/2019    On 07/24/2019    On 07/19/2019      Assessment & Plan: Post thrombotic syndrome versus cellulitis of RLE: I think patient has post thrombotic syndrome from recent DVT.  She has no constitutional symptoms or leukocytosis on arrival although she is developing leukocytosis now.  However, tenderness and induration.  She did not show significant change with antibiotic but could be early.  Blood cultures negative.  ABI noncompressible arteries bilaterally.  TBI normal bilaterally. -Vancomycin and Zosyn 9/5-9/8 -Ceftriaxone 9/8-9/10 -Ancef 9/11>>  Right lower extremity DVT: Diagnosed in 05/2019.  Has been on Coumadin likely due to body habitus.  INR subtherapeutic. -On warfarin with heparin bridge per pharmacy.  AKI/azotemia/BMD: suspect ATN from IV antibiotics (vancomycin and Zosyn).  She is on lisinopril at home.  Also received Lasix here.  Also had some low blood pressures but not reliable due to her body habitus.  Renal ultrasound, cortisol, TSH not impressive.  UA with  moderate Hgb and 100 protein. FENA 2.2% suggesting intrinsic etiology. -Cr 0.62 (admit)>> 2.08>> 5.22>> 6.21.  BUN 45. -Phosphorus elevated to 7.1. -IV normal saline at 100 cc an hour  -Appreciate guidance by nephrology -[facility] by vascular surgery today.  Symptomatic acute on chronic anemia: anemia panel favors iron deficiency with some element of anemia of chronic disease. -IV Feraheme 9/10 -Will hold of transfusion in case nephrology wants autoimmune labs.  Essential hypertension: soft blood  pressures but not sure if those measurements are reliable due to body habitus. -Continue holding lisinopril in the setting of AKI  GERD:  -On PPI.  This could potentially contribute to her AKI.  Mood disorder: Stable. -Continue home medication  DVT prophylaxis: On warfarin with heparin bridge for DVT treatment Code Status: Full code Family Communication: Patient and/or RN. Available if any question.  Disposition Plan: Remains inpatient for AKI Consultants: Nephrology, vascular surgery  Procedures:  9/11-TDC  Microbiology summarized: SARS-CoV-2 negative. Blood cultures negative.  Antimicrobials: Anti-infectives (From admission, onward)   Start     Dose/Rate Route Frequency Ordered Stop   07/25/19 1100  ceFAZolin (ANCEF) IVPB 2g/100 mL premix     2 g 200 mL/hr over 30 Minutes Intravenous Every 12 hours 07/25/19 1023     07/22/19 1730  cefTRIAXone (ROCEPHIN) 2 g in sodium chloride 0.9 % 100 mL IVPB  Status:  Discontinued     2 g 200 mL/hr over 30 Minutes Intravenous Every 24 hours 07/22/19 1704 07/24/19 1314   07/20/19 0615  vancomycin (VANCOCIN) 1,500 mg in sodium chloride 0.9 % 500 mL IVPB  Status:  Discontinued     1,500 mg 250 mL/hr over 120 Minutes Intravenous Every 8 hours 07/20/19 0610 07/22/19 0825   07/19/19 2200  Vancomycin (VANCOCIN) 1,500 mg in sodium chloride 0.9 % 500 mL IVPB  Status:  Discontinued     1,500 mg 250 mL/hr over 120 Minutes Intravenous Every 8 hours 07/19/19 1846 07/20/19 0606   07/19/19 2130  piperacillin-tazobactam (ZOSYN) IVPB 3.375 g  Status:  Discontinued     3.375 g 100 mL/hr over 30 Minutes Intravenous  Once 07/19/19 2122 07/19/19 2127   07/19/19 2130  vancomycin (VANCOCIN) IVPB 1000 mg/200 mL premix  Status:  Discontinued     1,000 mg 200 mL/hr over 60 Minutes Intravenous  Once 07/19/19 2122 07/19/19 2127   07/19/19 2130  piperacillin-tazobactam (ZOSYN) IVPB 3.375 g  Status:  Discontinued     3.375 g 12.5 mL/hr over 240 Minutes Intravenous  Every 8 hours 07/19/19 1846 07/22/19 1704   07/19/19 1230  vancomycin (VANCOCIN) IVPB 1000 mg/200 mL premix     1,000 mg 200 mL/hr over 60 Minutes Intravenous Every 1 hr x 2 07/19/19 1215 07/19/19 1523   07/19/19 1230  piperacillin-tazobactam (ZOSYN) IVPB 3.375 g     3.375 g 12.5 mL/hr over 240 Minutes Intravenous  Once 07/19/19 1215 07/19/19 1323      Sch Meds:  Scheduled Meds: . Chlorhexidine Gluconate Cloth  6 each Topical Q0600  . influenza vac split quadrivalent PF  0.5 mL Intramuscular Tomorrow-1000  . montelukast  10 mg Oral Daily  . nortriptyline  25 mg Oral QHS  . pantoprazole  40 mg Oral BID AC  . sertraline  100 mg Oral BID  . topiramate  50 mg Oral Daily   Continuous Infusions: . sodium chloride 150 mL/hr at 07/25/19 1144  .  ceFAZolin (ANCEF) IV 2 g (07/25/19 1218)  . heparin 2,900 Units/hr (07/25/19 1144)   PRN  Meds:.acetaminophen **OR** acetaminophen, albuterol, ondansetron **OR** ondansetron (ZOFRAN) IV, oxyCODONE, sodium chloride flush   I have personally reviewed the following labs and images: CBC: Recent Labs  Lab 07/21/19 0339 07/22/19 0426 07/23/19 0419 07/24/19 0508 07/25/19 0930  WBC 9.4 11.6* 13.9* 17.2* 16.7*  HGB 7.9* 7.5* 7.1* 7.6* 7.2*  HCT 27.0* 26.2* 23.7* 25.4* 24.6*  MCV 88.2 89.1 88.4 88.2 90.1  PLT 440* 422* 395 458* 467*   BMP &GFR Recent Labs  Lab 07/21/19 0339 07/22/19 0426 07/23/19 0419 07/24/19 0508 07/25/19 0347  NA 135 135 132* 133* 134*  K 4.0 4.2 4.4 4.7 5.0  CL 103 102 101 102 105  CO2 23 22 19* 17* 16*  GLUCOSE 110* 118* 98 98 93  BUN 18 23* 32* 39* 45*  CREATININE 0.88 2.08* 3.76* 5.22* 6.21*  CALCIUM 9.5 9.1 8.4* 8.3* 8.4*  PHOS  --   --   --   --  7.1*   Estimated Creatinine Clearance: 24.6 mL/min (A) (by C-G formula based on SCr of 6.21 mg/dL (H)). Liver & Pancreas: Recent Labs  Lab 07/19/19 1153 07/25/19 0347  AST 11*  --   ALT 10  --   ALKPHOS 63  --   BILITOT 0.1*  --   PROT 8.5*  --   ALBUMIN  3.3* 2.2*   No results for input(s): LIPASE, AMYLASE in the last 168 hours. No results for input(s): AMMONIA in the last 168 hours. Diabetic: No results for input(s): HGBA1C in the last 72 hours. No results for input(s): GLUCAP in the last 168 hours. Cardiac Enzymes: No results for input(s): CKTOTAL, CKMB, CKMBINDEX, TROPONINI in the last 168 hours. No results for input(s): PROBNP in the last 8760 hours. Coagulation Profile: Recent Labs  Lab 07/22/19 0426 07/22/19 2006 07/23/19 0419 07/24/19 0508 07/25/19 0347  INR 2.1* 1.7* 1.6* 1.6* 1.8*   Thyroid Function Tests: Recent Labs    07/25/19 0930  TSH 1.317   Lipid Profile: No results for input(s): CHOL, HDL, LDLCALC, TRIG, CHOLHDL, LDLDIRECT in the last 72 hours. Anemia Panel: Recent Labs    07/24/19 0911  VITAMINB12 832  FOLATE 11.2  FERRITIN 77  TIBC 211*  IRON 10*  RETICCTPCT 1.1   Urine analysis:    Component Value Date/Time   COLORURINE YELLOW 07/25/2019 0635   APPEARANCEUR TURBID (A) 07/25/2019 0635   LABSPEC 1.019 07/25/2019 0635   PHURINE 5.0 07/25/2019 0635   GLUCOSEU NEGATIVE 07/25/2019 0635   HGBUR MODERATE (A) 07/25/2019 0635   BILIRUBINUR NEGATIVE 07/25/2019 0635   KETONESUR NEGATIVE 07/25/2019 0635   PROTEINUR 100 (A) 07/25/2019 0635   UROBILINOGEN 0.2 05/11/2013 1903   NITRITE NEGATIVE 07/25/2019 0635   LEUKOCYTESUR NEGATIVE 07/25/2019 0635   Sepsis Labs: Invalid input(s): PROCALCITONIN, [place]  Microbiology: Recent Results (from the past 240 hour(s))  Blood culture (routine x 2)     Status: None   Collection Time: 07/19/19 11:52 AM   Specimen: Right Antecubital; Blood  Result Value Ref Range Status   Specimen Description   Final    RIGHT ANTECUBITAL BOTTLES DRAWN AEROBIC AND ANAEROBIC   Special Requests Blood Culture adequate volume  Final   Culture   Final    NO GROWTH 5 DAYS Performed at [facility], [address], [place], [place] [address]    Report Status 07/24/2019 FINAL   Final  Blood culture (routine x 2)     Status: None   Collection Time: 07/19/19 12:27 PM   Specimen: Left Antecubital; Blood  Result Value Ref Range Status   Specimen Description   Final    LEFT ANTECUBITAL BOTTLES DRAWN AEROBIC AND ANAEROBIC   Special Requests Blood Culture adequate volume  Final   Culture   Final    NO GROWTH 5 DAYS Performed at [facility], [address] [place], [place] [address]    Report Status 07/24/2019 FINAL  Final  SARS Coronavirus 2 [facility] order, Performed in [facility] hospital lab) Nasopharyngeal Nasopharyngeal Swab     Status: None   Collection Time: 07/19/19 12:50 PM   Specimen: Nasopharyngeal Swab  Result Value Ref Range Status   SARS Coronavirus 2 NEGATIVE NEGATIVE Final    Comment: (NOTE) If result is NEGATIVE SARS-CoV-2 target nucleic acids are NOT DETECTED. The SARS-CoV-2 RNA is generally detectable in upper and lower  respiratory specimens during the acute phase of infection. The lowest  concentration of SARS-CoV-2 viral copies this assay can detect is 250  copies / mL. A negative result does not preclude SARS-CoV-2 infection  and should not be used as the sole basis for treatment or other  patient management decisions.  A negative result may occur with  improper specimen collection / handling, submission of specimen other  than nasopharyngeal swab, presence of viral mutation(s) within the  areas targeted by this assay, and inadequate number of viral copies  (<250 copies / mL). A negative result must be combined with clinical  observations, patient history, and epidemiological information. If result is POSITIVE SARS-CoV-2 target nucleic acids are DETECTED. The SARS-CoV-2 RNA is generally detectable in upper and lower  respiratory specimens dur ing the acute phase of infection.  Positive  results are indicative of active infection with SARS-CoV-2.  Clinical  correlation with patient history and other diagnostic information is   necessary to determine patient infection status.  Positive results do  not rule out bacterial infection or co-infection with other viruses. If result is PRESUMPTIVE POSTIVE SARS-CoV-2 nucleic acids MAY BE PRESENT.   A presumptive positive result was obtained on the submitted specimen  and confirmed on repeat testing.  While 2019 novel coronavirus  (SARS-CoV-2) nucleic acids may be present in the submitted sample  additional confirmatory testing may be necessary for epidemiological  and / or clinical management purposes  to differentiate between  SARS-CoV-2 and other Sarbecovirus currently known to infect humans.  If clinically indicated additional testing with an alternate test  methodology [phone]) is advised. The SARS-CoV-2 RNA is generally  detectable in upper and lower respiratory sp ecimens during the acute  phase of infection. The expected result is Negative. Fact Sheet for Patients:  StrictlyIdeas.no Fact Sheet for Healthcare Providers: BankingDealers.co.za This test is not yet approved or cleared by the [place] FDA and has been authorized for detection and/or diagnosis of SARS-CoV-2 by FDA under an Emergency Use Authorization (EUA).  This EUA will remain in effect (meaning this test can be used) for the duration of the COVID-19 declaration under Section 564(b)(1) of the Act, 21 U.S.C. section 360bbb-3(b)(1), unless the authorization is terminated or revoked sooner. Performed at [facility], [address] [place], [place] [address]     Radiology Studies: Vas [place] Abi With/wo Tbi  Result Date: 07/25/2019 LOWER EXTREMITY DOPPLER STUDY Indications: Swelling. High Risk Factors: None.  Limitations: Today's exam was limited due to involuntary patient movement and              patient body habitus. Comparison Study: No prior studies. Performing Technologist: Carlos Levering Rvt  Examination Guidelines:  A complete evaluation includes  at minimum, Doppler waveform signals and systolic blood pressure reading at the level of bilateral brachial, anterior tibial, and posterior tibial arteries, when vessel segments are accessible. Bilateral testing is considered an integral part of a complete examination. Photoelectric Plethysmograph (PPG) waveforms and toe systolic pressure readings are included as required and additional duplex testing as needed. Limited examinations for reoccurring indications may be performed as noted.  ABI Findings: +---------+------------------+-----+--------+--------------+ Right    Rt Pressure (mmHg)IndexWaveformComment        +---------+------------------+-----+--------+--------------+ Brachial                                Restricted arm +---------+------------------+-----+--------+--------------+ PTA      61                1.52 biphasic               +---------+------------------+-----+--------+--------------+ DP       70                1.75 biphasic               +---------+------------------+-----+--------+--------------+ Great Toe42                1.05                        +---------+------------------+-----+--------+--------------+ +---------+------------------+-----+--------+----------------------------------+ Left     Lt Pressure (mmHg)IndexWaveformComment                            +---------+------------------+-----+--------+----------------------------------+ Brachial 40                             Radial pressure was taken due to                                           patient body habitus               +---------+------------------+-----+--------+----------------------------------+ PTA      67                1.68 biphasic                                   +---------+------------------+-----+--------+----------------------------------+ DP       68                1.70 biphasic                                    +---------+------------------+-----+--------+----------------------------------+ Great Toe55                1.38                                            +---------+------------------+-----+--------+----------------------------------+ +-------+-----------+-----------+------------+------------+ ABI/TBIToday's ABIToday's TBIPrevious ABIPrevious TBI +-------+-----------+-----------+------------+------------+ Right  1.75       1.05                                +-------+-----------+-----------+------------+------------+  Left   1.7        1.38                                +-------+-----------+-----------+------------+------------+  Summary: Right: Resting right ankle-brachial index indicates noncompressible right lower extremity arteries. The right toe-brachial index is normal. Posterior tibial and dorsalis pedis artery pressures are likely incorrect due to cuff placement as a result of patient body habitus. Waveforms appear multiphasic, with great toe perfusion. Left: Resting left ankle-brachial index indicates noncompressible left lower extremity arteries. The left toe-brachial index is normal. Posterior tibial and dorsalis pedis artery pressures are likely incorrect due to cuff placement as a result of patient body habitus. Waveforms appear multiphasic, with great toe perfusion.  *See table(s) above for measurements and observations.     Preliminary     Erika Slaby T. [place]  If 7PM-7AM, please contact night-coverage [email or web address] Password [email or web address] 07/25/2019, 1:08 PM

## 2019-07-25 NOTE — Progress Notes (Signed)
ABI's have been completed. Preliminary results can be found in CV Proc through chart review.   07/25/19 9:32 AM Megan Ortiz RVT

## 2019-07-25 NOTE — Progress Notes (Signed)
UA sent to lab at [phone].

## 2019-07-25 NOTE — Anesthesia Procedure Notes (Addendum)
Procedure Name: Intubation Performed by: Milford Cage, CRNA Pre-anesthesia Checklist: Patient identified, Emergency Drugs available, Suction available and Patient being monitored Patient Re-evaluated:Patient Re-evaluated prior to induction Oxygen Delivery Method: Circle System Utilized Preoxygenation: Pre-oxygenation with 100% oxygen Induction Type: IV induction and Rapid sequence Laryngoscope Size: Glidescope and 4 Grade View: Grade I Tube type: Oral Tube size: 7.5 mm Number of attempts: 1 Airway Equipment and Method: Stylet and Oral airway Placement Confirmation: ETT inserted through vocal cords under direct vision,  positive ETCO2 and breath sounds checked- equal and bilateral Secured at: 23 cm Tube secured with: Tape Dental Injury: Teeth and Oropharynx as per pre-operative assessment  Difficulty Due To: Difficulty was anticipated and Difficult Airway-  due to edematous airway

## 2019-07-25 NOTE — Progress Notes (Signed)
Notified that labs need to be drawn. Arrived to patient's room. Patient is off unit at this time for dialysis. Called and notified Glennie Hawk of labs due. VU. Fran Lowes, RN VAST

## 2019-07-25 NOTE — Consult Note (Addendum)
[facility] NOTE   MRN : [id number]  Reason for Consult: Acute kidney failure Referring Physician: Nephrology  History of Present Illness: 43 y/o female presented with right leg pain and now diagnosed with AKI.  We have been asked to place a TDC.  Past medical history includes morbid obesity, previous hospital    2 months ago  for extensive right lower extremity DVT, prior history of VTE, morbid obesity, history of necrotizing fasciitis, presents to the hospital with a 2-week history of increasing pain in the right lower extremity.  She reports that her lower extremity is edematous, but has overall improved since initial DVT was diagnosed 2 months ago managed on Heparin currently.  She was on Coumadin prior to admission.    She has no history of chest implants.   Current Facility-Administered Medications  Medication Dose Route Frequency Provider Last Rate Last Dose  . 0.9 %  sodium chloride infusion   Intravenous Continuous Wendee Beavers T, MD 150 mL/hr at 07/25/19 0243    . acetaminophen (TYLENOL) tablet 650 mg  650 mg Oral Q6H PRN Kathie Dike, MD   650 mg at 07/25/19 [id number]   Or  . acetaminophen (TYLENOL) suppository 650 mg  650 mg Rectal Q6H PRN Kathie Dike, MD      . albuterol (PROVENTIL) (2.5 MG/3ML) 0.083% nebulizer solution 2.5 mg  2.5 mg Nebulization Q2H PRN Denton Brick, Courage, MD   2.5 mg at 07/24/19 2027  . ceFAZolin (ANCEF) IVPB 2g/100 mL premix  2 g Intravenous Q12H Skeet Simmer, [facility]      . Chlorhexidine Gluconate Cloth 2 % PADS 6 each  6 each Topical Q0600 Finnigan, Nancy A, DO      . heparin ADULT infusion 100 units/mL (25000 units/214mL sodium chloride 0.45%)  2,900 Units/hr Intravenous Continuous Erenest Blank, RPH 29 mL/hr at 07/25/19 0126 2,900 Units/hr at 07/25/19 0126  . influenza vac split quadrivalent PF (FLUARIX) injection 0.5 mL  0.5 mL Intramuscular Tomorrow-1000 Emokpae, Courage, MD      . montelukast (SINGULAIR) tablet  10 mg  10 mg Oral Daily Kathie Dike, MD   10 mg at 07/25/19 [id number]  . nortriptyline (PAMELOR) capsule 25 mg  25 mg Oral QHS Kathie Dike, MD   25 mg at 07/24/19 2213  . ondansetron (ZOFRAN) tablet 4 mg  4 mg Oral Q6H PRN Kathie Dike, MD       Or  . ondansetron (ZOFRAN) injection 4 mg  4 mg Intravenous Q6H PRN Kathie Dike, MD   4 mg at 07/24/19 0144  . oxyCODONE (Oxy IR/ROXICODONE) immediate release tablet 5-10 mg  5-10 mg Oral Q4H PRN Bodenheimer, Charles A, NP   10 mg at 07/25/19 0839  . pantoprazole (PROTONIX) EC tablet 40 mg  40 mg Oral BID AC Kathie Dike, MD   40 mg at 07/25/19 0838  . sertraline (ZOLOFT) tablet 100 mg  100 mg Oral BID Kathie Dike, MD   100 mg at 07/25/19 0839  . sodium chloride flush (NS) 0.9 % injection 10-40 mL  10-40 mL Intracatheter PRN Kathie Dike, MD      . topiramate (TOPAMAX) tablet 50 mg  50 mg Oral Daily Kathie Dike, MD   50 mg at 07/25/19 0839    Pt meds include: Statin :No Betablocker: No ASA: No Other anticoagulants/antiplatelets: Heparin now, out patient Coumadin  Past Medical History:  Diagnosis Date  . Anemia   . Anxiety   . Asthma   . DVT (  deep venous thrombosis) ([place])   . Gastric ulcer   . Headache   . Hiatal hernia   . Hypertension   . PE (pulmonary embolism)   . Tachycardia     Past Surgical History:  Procedure Laterality Date  . CESAREAN SECTION    . CHOLECYSTECTOMY    . ESOPHAGOGASTRODUODENOSCOPY (EGD) WITH PROPOFOL N/A 05/25/2019   Procedure: ESOPHAGOGASTRODUODENOSCOPY (EGD) WITH PROPOFOL;  Surgeon: [name], MD;  Location: [facility] ENDOSCOPY;  Service: Endoscopy;  Laterality: N/A;  . INCISION AND DRAINAGE ABSCESS Left 02/16/2017   Procedure: DEBRIDEMENT LEFT BUTTOCK ABSCESS;  Surgeon: Fanny Skates, MD;  Location: [facility];  Service: General;  Laterality: Left;  . IRRIGATION AND DEBRIDEMENT BUTTOCKS Left 02/19/2017   Procedure: DEBRIDEMENT OF GLUTEAL WOUND;  Surgeon: Rolm Bookbinder, MD;  Location: [place];   Service: General;  Laterality: Left;    Social History Social History   Tobacco Use  . Smoking status: Former Smoker    Types: Cigarettes  . Smokeless tobacco: Never Used  . Tobacco comment: QUIT ABOUT 5 YEARS AGO  Substance Use Topics  . Alcohol use: Yes    Comment: occa  . Drug use: No    Family History Family History  Problem Relation Age of Onset  . Depression Mother   . Alcohol abuse Mother     Allergies  Allergen Reactions  . Aspirin     Due to hx of stomach ulcers     REVIEW OF SYSTEMS  General: [ ]  Weight loss, [ ]  Fever, [ ]  chills [x]  obese  Neurologic: [ ]  Dizziness, [ ]  Blackouts, [ ]  Seizure [ ]  Stroke, [ ]  "Mini stroke", [ ]  Slurred speech, [ ]  Temporary blindness; [ ]  weakness in arms or legs, [ ]  Hoarseness [ ]  Dysphagia Cardiac: [ ]  Chest pain/pressure, [ ]  Shortness of breath at rest [ ]  Shortness of breath with exertion, [ ]  Atrial fibrillation or irregular heartbeat  Vascular: [ ]  Pain in legs with walking, [ ]  Pain in legs at rest, [ ]  Pain in legs at night,  [ ]  Non-healing ulcer, [x ] Blood clot in vein/DVT,   Pulmonary: [ ]  Home oxygen, [ ]  Productive cough, [ ]  Coughing up blood, [ ]  Asthma,  [ ]  Wheezing [ ]  COPD Musculoskeletal:  [ ]  Arthritis, [ ]  Low back pain, [ ]  Joint pain Hematologic: [ ]  Easy Bruising, [ ]  Anemia; [ ]  Hepatitis Gastrointestinal: [ ]  Blood in stool, [ ]  Gastroesophageal Reflux/heartburn, Urinary: [ ]  chronic Kidney disease, [ ]  on HD - [ ]  MWF or [ ]  TTHS, [ ]  Burning with urination, [ ]  Difficulty urinating Skin: [ ]  Rashes, [x ] Wounds Psychological: [ ]  Anxiety, [ ]  Depression  Physical Examination Vitals:   07/24/19 1507 07/24/19 1540 07/24/19 1949 07/25/19 0842  BP:  (!) 88/54 (!) 88/41 (!) 119/104  Pulse: 98 (!) 102 (!) 107 (!) 110  Resp:  17 18 17   Temp:  98 F (36.7 C) 98.3 F (36.8 C) 98.6 F (37 C)  TempSrc:  Oral Oral Oral  SpO2:  93% 95% 92%  Weight:      Height:       Body mass index is  92.08 kg/m.  General:  WDWN in NAD HENT: WNL Eyes: Pupils equal Pulmonary: normal non-labored breathing , without Rales, rhonchi,  wheezing Cardiac: RRR, without  Murmurs, rubs or gallops; No carotid bruits Abdomen: soft, NT, no masses Skin: no rashes, ulcers noted;  no Gangrene ,  no cellulitis; no open wounds;   Vascular Exam/Pulses:Palpable radial pulses,    Musculoskeletal: no muscle wasting or atrophy; Right LE with slight erythema and ecchymosis  Neurologic: A&O X 3; Appropriate Affect ;  SENSATION: normal; MOTOR FUNCTION: 5/5 Symmetric Speech is fluent/normal   Significant Diagnostic Studies: CBC Lab Results  Component Value Date   WBC 16.7 (H) 07/25/2019   HGB 7.2 (L) 07/25/2019   HCT 24.6 (L) 07/25/2019   MCV 90.1 07/25/2019   PLT 467 (H) 07/25/2019    BMET    Component Value Date/Time   NA 134 (L) 07/25/2019 0347   NA 132 (A) 03/09/2017   K 5.0 07/25/2019 0347   CL 105 07/25/2019 0347   CO2 16 (L) 07/25/2019 0347   GLUCOSE 93 07/25/2019 0347   BUN 45 (H) 07/25/2019 0347   BUN 10 03/09/2017   CREATININE 6.21 (H) 07/25/2019 0347   CALCIUM 8.4 (L) 07/25/2019 0347   GFRNONAA 8 (L) 07/25/2019 0347   GFRAA 9 (L) 07/25/2019 0347   Estimated Creatinine Clearance: 24.6 mL/min (A) (by C-G formula based on SCr of 6.21 mg/dL (H)).  COAG Lab Results  Component Value Date   INR 1.8 (H) 07/25/2019   INR 1.6 (H) 07/24/2019   INR 1.6 (H) 07/23/2019     Non-Invasive Vascular Imaging:  ABI B patent flow > 1.0, with TBI's  ASSESSMENT/PLAN:  AKI plan to place [facility] today She has been NPO and agrees to plan   Megan Ortiz 07/25/2019 10:36 AM  I have independently interviewed and examined patient and agree with PA assessment and plan above.  We will plan for tunneled dialysis catheter today in operating room.  I discussed risk benefits alternatives she agrees to proceed.  Tejah Brekke C. Donzetta Matters, MD [facility] [place] Office:  [phone] Pager: [phone]

## 2019-07-25 NOTE — Plan of Care (Signed)
  Problem: Education: Goal: Knowledge of General Education information will improve Description: Including pain rating scale, medication(s)/side effects and non-pharmacologic comfort measures Outcome: Progressing   Problem: Clinical Measurements: Goal: Will remain free from infection Outcome: Progressing Goal: Diagnostic test results will improve Outcome: Progressing Goal: Respiratory complications will improve Outcome: Progressing   Problem: Activity: Goal: Risk for activity intolerance will decrease Outcome: Progressing   Problem: Elimination: Goal: Will not experience complications related to bowel motility Outcome: Progressing Goal: Will not experience complications related to urinary retention Outcome: Progressing   Problem: Pain Managment: Goal: General experience of comfort will improve Outcome: Progressing   Problem: Safety: Goal: Ability to remain free from injury will improve Outcome: Progressing   Problem: Skin Integrity: Goal: Risk for impaired skin integrity will decrease Outcome: Progressing

## 2019-07-25 NOTE — Anesthesia Preprocedure Evaluation (Addendum)
Anesthesia Evaluation  Patient identified by MRN, date of birth, ID band Patient awake    Reviewed: Allergy & Precautions, Patient's Chart, lab work & pertinent test results  History of Anesthesia Complications Negative for: history of anesthetic complications  Airway Mallampati: III  TM Distance: >3 FB     Dental  (+) Dental Advisory Given   Pulmonary asthma , former smoker,    breath sounds clear to auscultation       Cardiovascular hypertension, Pt. on medications + DVT (on Coumadin)   Rhythm:Regular Rate:Normal     Neuro/Psych Anxiety negative neurological ROS  negative psych ROS   GI/Hepatic Neg liver ROS, hiatal hernia, PUD, GERD  Medicated and Controlled,  Endo/Other  Morbid obesity (BMI 92)  Renal/GU ARFRenal diseaseCr 6.2, K 5.0  negative genitourinary   Musculoskeletal negative musculoskeletal ROS (+)   Abdominal   Peds  Hematology  (+) anemia , Hgb 7.2   Anesthesia Other Findings RLE cellulitis  Reproductive/Obstetrics negative OB ROS                          Anesthesia Physical Anesthesia Plan  ASA: IV  Anesthesia Plan: General   Post-op Pain Management:    Induction: Intravenous  PONV Risk Score and Plan: 3 and Treatment may vary due to age or medical condition, Ondansetron, Midazolam and Dexamethasone  Airway Management Planned: Oral ETT  Additional Equipment:   Intra-op Plan:   Post-operative Plan: Extubation in OR  Informed Consent: I have reviewed the patients History and Physical, chart, labs and discussed the procedure including the risks, benefits and alternatives for the proposed anesthesia with the patient or authorized representative who has indicated his/her understanding and acceptance.       Plan Discussed with:   Anesthesia Plan Comments:        Anesthesia Quick Evaluation

## 2019-07-25 NOTE — Progress Notes (Signed)
[facility]    NEPHROLOGY PROGRESS NOTE  SUBJECTIVE: No acute complaints today.  Reports no urine output.  Denies any cp/sob.  Denies n/v.  All other ROS negative.   OBJECTIVE:  Vitals:   07/24/19 1949 07/25/19 0842  BP: (!) 88/41 (!) 119/104  Pulse: (!) 107 (!) 110  Resp: 18 17  Temp: 98.3 F (36.8 C) 98.6 F (37 C)  SpO2: 95% 92%    Intake/Output Summary (Last 24 hours) at 07/25/2019 1708 Last data filed at 07/25/2019 0900 Gross per 24 hour  Intake 500.69 ml  Output -  Net 500.69 ml      General:  AAOx3 NAD HEENT: MMM [place] AT anicteric sclera Neck:  No JVD, no adenopathy CV:  Heart RRR  Lungs:  L/S CTA bilaterally Abd:  abd SNT/ND with normal BS, morbidly obese GU:  Bladder non-palpable Extremities:  (+) necrotic lesions right leg, (+)1 edema Skin:  No skin rash  MEDICATIONS:  . [MAR Hold] Chlorhexidine Gluconate Cloth  6 each Topical Q0600  . [MAR Hold] influenza vac split quadrivalent PF  0.5 mL Intramuscular Tomorrow-1000  . [MAR Hold] montelukast  10 mg Oral Daily  . [MAR Hold] nortriptyline  25 mg Oral QHS  . [MAR Hold] pantoprazole  40 mg Oral BID AC  . [MAR Hold] sertraline  100 mg Oral BID  . [MAR Hold] topiramate  50 mg Oral Daily       LABS:   CBC Latest Ref Rng & Units 07/25/2019 07/24/2019 07/23/2019  WBC 4.0 - 10.5 K/uL 16.7(H) 17.2(H) 13.9(H)  Hemoglobin 12.0 - 15.0 g/dL 7.2(L) 7.6(L) 7.1(L)  Hematocrit 36.0 - 46.0 % 24.6(L) 25.4(L) 23.7(L)  Platelets 150 - 400 K/uL 467(H) 458(H) 395    CMP Latest Ref Rng & Units 07/25/2019 07/24/2019 07/23/2019  Glucose 70 - 99 mg/dL 93 98 98  BUN 6 - 20 mg/dL 45(H) 39(H) 32(H)  Creatinine 0.44 - 1.00 mg/dL 6.21(H) 5.22(H) 3.76(H)  Sodium 135 - 145 mmol/L 134(L) 133(L) 132(L)  Potassium 3.5 - 5.1 mmol/L 5.0 4.7 4.4  Chloride 98 - 111 mmol/L 105 102 101  CO2 22 - 32 mmol/L 16(L) 17(L) 19(L)  Calcium 8.9 - 10.3 mg/dL 8.4(L) 8.3(L) 8.4(L)  Total Protein 6.5 - 8.1 g/dL - - -  Total Bilirubin 0.3 -  1.2 mg/dL - - -  Alkaline Phos 38 - 126 U/L - - -  AST 15 - 41 U/L - - -  ALT 0 - 44 U/L - - -    Lab Results  Component Value Date   CALCIUM 8.4 (L) 07/25/2019   PHOS 7.1 (H) 07/25/2019       Component Value Date/Time   COLORURINE YELLOW 07/25/2019 0635   APPEARANCEUR TURBID (A) 07/25/2019 0635   LABSPEC 1.019 07/25/2019 0635   PHURINE 5.0 07/25/2019 0635   GLUCOSEU NEGATIVE 07/25/2019 0635   HGBUR MODERATE (A) 07/25/2019 0635   BILIRUBINUR NEGATIVE 07/25/2019 0635   KETONESUR NEGATIVE 07/25/2019 0635   PROTEINUR 100 (A) 07/25/2019 0635   UROBILINOGEN 0.2 05/11/2013 1903   NITRITE NEGATIVE 07/25/2019 0635   LEUKOCYTESUR NEGATIVE 07/25/2019 0635      Component Value Date/Time   PHART 7.371 05/30/2019 1655   PCO2ART 33.4 05/30/2019 1655   PO2ART 79.4 (L) 05/30/2019 1655   HCO3 18.9 (L) 05/30/2019 1655   TCO2 21 02/19/2017 0856   ACIDBASEDEF 5.4 (H) 05/30/2019 1655   O2SAT 96.2 05/30/2019 1655       Component Value Date/Time   IRON 10 (  L) 07/24/2019 0911   TIBC 211 (L) 07/24/2019 0911   FERRITIN 77 07/24/2019 0911   IRONPCTSAT 5 (L) 07/24/2019 0911       ASSESSMENT/PLAN:    This is a 43 year old lady with history of morbid obesity recent right lower extremity DVT July 2020.  She been taking Coumadin she has a history of necrotizing fasciitis and debridements of the buttock and gluteal wound in 2018.  She presented 07/19/2019 with right lower extremity pain induration ecchymosis and tenderness of the medial aspect of thigh.  Not able to get a CT scan secondary to body habitus.  She appears to have some acute kidney injury and nephrology was consulted.   Acute kidney injury.   [place] without obstruction.  Likely due to ATN / vanco.  Avoid nephrotoxins ACE inhibitor's ARB use nonsteroidal anti-inflammatory drugs.  Avoid IV contrast.  Avoid intra-arterial procedures.  Worsening renal function in the setting of anuria - will start HD.  Patient agrees.  Will monitor for HD need  daily.  Hypotension.  Will check 2D echo serum cortisol in a.m. TSH  Calciphylaxis/Coumadin necrosis.  Aggressive wound care management  Morbid obesity  RLE DVT.  Continue Salineno, DO, FACP

## 2019-07-25 NOTE — Transfer of Care (Signed)
Immediate Anesthesia Transfer of Care Note  Patient: Megan Ortiz  Procedure(s) Performed: INSERTION OF TUNNEL DIALYSIS CATHETER (Right Chest)  Patient Location: PACU  Anesthesia Type:General  Level of Consciousness: awake  Airway & Oxygen Therapy: Patient Spontanous Breathing and Patient connected to face mask oxygen  Post-op Assessment: Report given to RN and Post -op Vital signs reviewed and stable  Post vital signs: Reviewed and stable  Last Vitals:  Vitals Value Taken Time  BP    Temp    Pulse    Resp    SpO2      Last Pain:  Vitals:   07/25/19 0938  TempSrc:   PainSc: 3       Patients Stated Pain Goal: 2 ([id number])  Complications: No apparent anesthesia complications

## 2019-07-25 NOTE — Progress Notes (Signed)
Heparin discontinued per pharmacy order [place]. Will continue to monitor.

## 2019-07-25 NOTE — Op Note (Signed)
    Patient name: Megan Ortiz MRN: [id number] DOB: [birth year] Sex: female  07/25/2019 Pre-operative Diagnosis: Acute kidney injury Post-operative diagnosis:  Same Surgeon:  Erlene Quan C. Donzetta Matters, MD Procedure Performed:  Placement of 19 cm right IJ tunneled dialysis catheter with ultrasound guidance  Indications: 43 year old female with acute kidney injury in the setting of vancomycin.  She is now in need of dialysis with no access.   Procedure:  The patient was identified in the holding area and taken to the operating room where she is placed supine operative table general anesthesia was induced.  She was treated prepped draped the neck and just in usual fashion antibiotics were minister and timeout was called.  Ultrasound was used to identify the internal jugular vein was noted be patent and compressible.  This was cannulated with 18-gauge needle with direct ultrasound visualization.  A wire was passed centrally.  A 19 cm catheter was tunneled laterally through a counterincision and trimmed to size.  The wire tract was serially dilated introducer sheath was placed in fluoroscopic guidance.  The catheter was placed to the SVC atrial junction.  Was assembled flushed with heparinized saline affixed the skin with 3-0 nylon suture.  Neck incision was closed with 4 Monocryl dermal was placed to both sides.  The catheter was locked 1.5 cc of concentrated heparin either port.  She was awakened anesthesia having tolerated procedure without immediate complication.  Counts were correct at completion.   Sarahlynn Cisnero C. Donzetta Matters, MD [facility] [place] Office: [phone] Pager: [phone]

## 2019-07-26 ENCOUNTER — Inpatient Hospital Stay (HOSPITAL_COMMUNITY): Payer: Self-pay

## 2019-07-26 DIAGNOSIS — I87009 Postthrombotic syndrome without complications of unspecified extremity: Secondary | ICD-10-CM

## 2019-07-26 DIAGNOSIS — Z6841 Body Mass Index (BMI) 40.0 and over, adult: Secondary | ICD-10-CM

## 2019-07-26 DIAGNOSIS — I361 Nonrheumatic tricuspid (valve) insufficiency: Secondary | ICD-10-CM

## 2019-07-26 DIAGNOSIS — I959 Hypotension, unspecified: Secondary | ICD-10-CM

## 2019-07-26 DIAGNOSIS — K219 Gastro-esophageal reflux disease without esophagitis: Secondary | ICD-10-CM

## 2019-07-26 DIAGNOSIS — R000 Tachycardia, unspecified: Secondary | ICD-10-CM

## 2019-07-26 LAB — RENAL FUNCTION PANEL
Albumin: 2.1 g/dL — ABNORMAL LOW (ref 3.5–5.0)
Anion gap: 13 (ref 5–15)
BUN: 40 mg/dL — ABNORMAL HIGH (ref 6–20)
CO2: 19 mmol/L — ABNORMAL LOW (ref 22–32)
Calcium: 8 mg/dL — ABNORMAL LOW (ref 8.9–10.3)
Chloride: 104 mmol/L (ref 98–111)
Creatinine, Ser: 6.18 mg/dL — ABNORMAL HIGH (ref 0.44–1.00)
GFR calc Af Amer: 9 mL/min — ABNORMAL LOW (ref >60)
GFR calc non Af Amer: 8 mL/min — ABNORMAL LOW (ref >60)
Glucose, Bld: 91 mg/dL (ref 70–99)
Phosphorus: 6.5 mg/dL — ABNORMAL HIGH (ref 2.5–4.6)
Potassium: 4.4 mmol/L (ref 3.5–5.1)
Sodium: 136 mmol/L (ref 135–145)

## 2019-07-26 LAB — TYPE AND SCREEN
ABO/RH(D): O POS
Antibody Screen: NEGATIVE
Unit division: 0
Unit division: 0

## 2019-07-26 LAB — ECHOCARDIOGRAM COMPLETE
Height: 65 in
Weight: 9957.74 oz

## 2019-07-26 LAB — PREPARE RBC (CROSSMATCH)

## 2019-07-26 LAB — BPAM RBC
Blood Product Expiration Date: 202010142359
Blood Product Expiration Date: 202010172359
ISSUE DATE / TIME: 202009091725
Unit Type and Rh: 5100
Unit Type and Rh: 5100

## 2019-07-26 LAB — HEMOGLOBIN AND HEMATOCRIT, BLOOD
HCT: 25.4 % — ABNORMAL LOW (ref 36.0–46.0)
Hemoglobin: 7.6 g/dL — ABNORMAL LOW (ref 12.0–15.0)

## 2019-07-26 LAB — HEPARIN LEVEL (UNFRACTIONATED)
Heparin Unfractionated: <0.1 IU/mL — ABNORMAL LOW (ref 0.30–0.70)
Heparin Unfractionated: <0.1 IU/mL — ABNORMAL LOW (ref 0.30–0.70)

## 2019-07-26 LAB — CBC
HCT: 22.5 % — ABNORMAL LOW (ref 36.0–46.0)
Hemoglobin: 6.6 g/dL — CL (ref 12.0–15.0)
MCH: 26.2 pg (ref 26.0–34.0)
MCHC: 29.3 g/dL — ABNORMAL LOW (ref 30.0–36.0)
MCV: 89.3 fL (ref 80.0–100.0)
Platelets: 407 10*3/uL — ABNORMAL HIGH (ref 150–400)
RBC: 2.52 MIL/uL — ABNORMAL LOW (ref 3.87–5.11)
RDW: 22.5 % — ABNORMAL HIGH (ref 11.5–15.5)
WBC: 14.1 10*3/uL — ABNORMAL HIGH (ref 4.0–10.5)
nRBC: 0.1 % (ref 0.0–0.2)

## 2019-07-26 LAB — PROTIME-INR
INR: 2.1 — ABNORMAL HIGH (ref 0.8–1.2)
Prothrombin Time: 23.6 seconds — ABNORMAL HIGH (ref 11.4–15.2)

## 2019-07-26 LAB — HEPATITIS B SURFACE ANTIGEN: Hepatitis B Surface Ag: NEGATIVE

## 2019-07-26 MED ORDER — SEVELAMER CARBONATE 800 MG PO TABS
800.0000 mg | ORAL_TABLET | Freq: Three times a day (TID) | ORAL | Status: DC
  Administered 2019-07-26 – 2019-07-27 (×3): 800 mg via ORAL
  Filled 2019-07-26 (×3): qty 1

## 2019-07-26 MED ORDER — SODIUM CHLORIDE 0.9% IV SOLUTION
Freq: Once | INTRAVENOUS | Status: AC
  Administered 2019-07-26: 16:00:00 via INTRAVENOUS

## 2019-07-26 NOTE — Progress Notes (Signed)
[facility]    NEPHROLOGY PROGRESS NOTE  SUBJECTIVE: No acute complaints today.  Reports no urine output.  Denies any cp/sob.  Denies n/v.  Confused on examination.  All other ROS negative.   OBJECTIVE:  Vitals:   07/26/19 0354 07/26/19 0737  BP: (!) 96/42 (!) 90/31  Pulse: 98 (!) 111  Resp:  17  Temp: 99.1 F (37.3 C) 98.3 F (36.8 C)  SpO2: 93% 90%    Intake/Output Summary (Last 24 hours) at 07/26/2019 1140 Last data filed at 07/26/2019 0900 Gross per 24 hour  Intake 760 ml  Output 1600 ml  Net -840 ml      General:  AAOx3 NAD HEENT: MMM [place] AT anicteric sclera Neck:  No JVD, no adenopathy CV:  Heart RRR  Lungs:  L/S CTA bilaterally Abd:  abd SNT/ND with normal BS, morbidly obese GU:  Bladder non-palpable Extremities:  (+) necrotic lesions right leg, (+)2 edema Skin:  No skin rash  MEDICATIONS:  . sodium chloride   Intravenous Once  . Chlorhexidine Gluconate Cloth  6 each Topical Q0600  . influenza vac split quadrivalent PF  0.5 mL Intramuscular Tomorrow-1000  . montelukast  10 mg Oral Daily  . nortriptyline  25 mg Oral QHS  . pantoprazole  40 mg Oral BID AC  . sertraline  100 mg Oral BID  . topiramate  50 mg Oral Daily       LABS:   CBC Latest Ref Rng & Units 07/26/2019 07/25/2019 07/24/2019  WBC 4.0 - 10.5 K/uL 14.1(H) 16.7(H) 17.2(H)  Hemoglobin 12.0 - 15.0 g/dL 6.6(LL) 7.2(L) 7.6(L)  Hematocrit 36.0 - 46.0 % 22.5(L) 24.6(L) 25.4(L)  Platelets 150 - 400 K/uL 407(H) 467(H) 458(H)    CMP Latest Ref Rng & Units 07/26/2019 07/25/2019 07/24/2019  Glucose 70 - 99 mg/dL 91 93 98  BUN 6 - 20 mg/dL 40(H) 45(H) 39(H)  Creatinine 0.44 - 1.00 mg/dL 6.18(H) 6.21(H) 5.22(H)  Sodium 135 - 145 mmol/L 136 134(L) 133(L)  Potassium 3.5 - 5.1 mmol/L 4.4 5.0 4.7  Chloride 98 - 111 mmol/L 104 105 102  CO2 22 - 32 mmol/L 19(L) 16(L) 17(L)  Calcium 8.9 - 10.3 mg/dL 8.0(L) 8.4(L) 8.3(L)  Total Protein 6.5 - 8.1 g/dL - - -  Total Bilirubin 0.3 - 1.2 mg/dL - -  -  Alkaline Phos 38 - 126 U/L - - -  AST 15 - 41 U/L - - -  ALT 0 - 44 U/L - - -    Lab Results  Component Value Date   CALCIUM 8.0 (L) 07/26/2019   PHOS 6.5 (H) 07/26/2019       Component Value Date/Time   COLORURINE YELLOW 07/25/2019 0635   APPEARANCEUR TURBID (A) 07/25/2019 0635   LABSPEC 1.019 07/25/2019 0635   PHURINE 5.0 07/25/2019 0635   GLUCOSEU NEGATIVE 07/25/2019 0635   HGBUR MODERATE (A) 07/25/2019 0635   BILIRUBINUR NEGATIVE 07/25/2019 0635   KETONESUR NEGATIVE 07/25/2019 0635   PROTEINUR 100 (A) 07/25/2019 0635   UROBILINOGEN 0.2 05/11/2013 1903   NITRITE NEGATIVE 07/25/2019 0635   LEUKOCYTESUR NEGATIVE 07/25/2019 0635      Component Value Date/Time   PHART 7.371 05/30/2019 1655   PCO2ART 33.4 05/30/2019 1655   PO2ART 79.4 (L) 05/30/2019 1655   HCO3 18.9 (L) 05/30/2019 1655   TCO2 21 02/19/2017 0856   ACIDBASEDEF 5.4 (H) 05/30/2019 1655   O2SAT 96.2 05/30/2019 1655       Component Value Date/Time   IRON 10 (L) 07/24/2019  0911   TIBC 211 (L) 07/24/2019 0911   FERRITIN 77 07/24/2019 0911   IRONPCTSAT 5 (L) 07/24/2019 0911       ASSESSMENT/PLAN:    This is a 43 year old lady with history of morbid obesity recent right lower extremity DVT July 2020.  She been taking Coumadin she has a history of necrotizing fasciitis and debridements of the buttock and gluteal wound in 2018.  She presented 07/19/2019 with right lower extremity pain induration ecchymosis and tenderness of the medial aspect of thigh.  Not able to get a CT scan secondary to body habitus.  She appears to have some acute kidney injury and nephrology was consulted.   Acute kidney injury.   [place] without obstruction.  Likely due to ATN / vanco.  Avoid nephrotoxins ACE inhibitor's ARB use nonsteroidal anti-inflammatory drugs.  Avoid IV contrast.  Avoid intra-arterial procedures.  Worsening renal function in the setting of anuria -started HD on Friday, 07/25/2019.  Will discontinue IV fluids.  Will likely  need dialysis again on Monday.  Hypotension.    Will transfuse.    Calciphylaxis/Coumadin necrosis.  Aggressive wound care management  Morbid obesity  RLE DVT.  Continue anticoag  Hyperphosphatemia.  We will start sevelamer.  Anemia.  For PRBCs today.   [place], DO, [place]

## 2019-07-26 NOTE — Progress Notes (Signed)
ANTICOAGULATION CONSULT NOTE - Follow Up Consult  Pharmacy Consult for Heparin (Warfarin on hold) Indication: Recent DVT (05/24/19)  Allergies  Allergen Reactions  . Aspirin     Due to hx of stomach ulcers    Patient Measurements: Height: 5\' 5"  (165.1 cm) Weight: (!) 622 lb 5.7 oz (282.3 kg) IBW/kg (Calculated) : 57 Heparin Dosing Weight: 125.2  Vital Signs: Temp: 98.2 F (36.8 C) (09/12 1925) Temp Source: Oral (09/12 1925) BP: 101/49 (09/12 1925) Pulse Rate: 79 (09/12 1925)  Labs: Recent Labs    07/24/19 0508  07/25/19 0347 07/25/19 0930 07/26/19 1031 07/26/19 1054 07/26/19 2142  HGB 7.6*  --   --  7.2* 6.6*  --  7.6*  HCT 25.4*  --   --  24.6* 22.5*  --  25.4*  PLT 458*  --   --  467* 407*  --   --   LABPROT 19.0*  --  20.2*  --  23.6*  --   --   INR 1.6*  --  1.8*  --  2.1*  --   --   HEPARINUNFRC <0.10*   < >  --  0.54 <0.10*  --  <0.10*  CREATININE 5.22*  --  6.21*  --   --  6.18*  --    < > = values in this interval not displayed.    Estimated Creatinine Clearance: 27 mL/min (A) (by C-G formula based on SCr of 6.18 mg/dL (H)).   Medications:  Scheduled:  . Chlorhexidine Gluconate Cloth  6 each Topical Q0600  . influenza vac split quadrivalent PF  0.5 mL Intramuscular Tomorrow-1000  . montelukast  10 mg Oral Daily  . nortriptyline  25 mg Oral QHS  . pantoprazole  40 mg Oral BID AC  . sertraline  100 mg Oral BID  . sevelamer carbonate  800 mg Oral TID WC  . topiramate  50 mg Oral Daily    Assessment: 43 yr old female on Coumadin PTA for hx DVT 05/24/19.  INR 8.1 on admit and Coumadin held. Vitamin K 5 mg PO given on 9/7.  IV heparin begun on 9/8 pm when INR down to 1.7 -INR= 2.1, hg= 7.6    Goal of Therapy:  Heparin level 0.3-0.7 units/ml Monitor platelets by anticoagulation protocol: Yes    Plan:  -With therapeutic INR will not adjust heparin -Daily heparin level and CBC -May need to consider holding heparin for now and watch INR  trend  Hildred Laser, PharmD Clinical Pharmacist **Pharmacist phone directory can now be found on [email or web address] ([email or web address]).  Listed under [place].

## 2019-07-26 NOTE — Progress Notes (Addendum)
ANTICOAGULATION CONSULT NOTE - Follow Up Consult  Pharmacy Consult for Heparin (Warfarin on hold) Indication: Recent DVT (05/24/19)  Allergies  Allergen Reactions  . Aspirin     Due to hx of stomach ulcers    Patient Measurements: Height: 5\' 5"  (165.1 cm) Weight: (!) 622 lb 5.7 oz (282.3 kg) IBW/kg (Calculated) : 57 Heparin Dosing Weight: 125.2  Vital Signs: Temp: 98.5 F (36.9 C) (09/12 1200) Temp Source: Oral (09/12 1200) BP: 104/53 (09/12 1150) Pulse Rate: 109 (09/12 1150)  Labs: Recent Labs    07/24/19 0508  07/24/19 1846 07/24/19 2243 07/25/19 0347 07/25/19 0930 07/26/19 1031 07/26/19 1054  HGB 7.6*  --   --   --   --  7.2* 6.6*  --   HCT 25.4*  --   --   --   --  24.6* 22.5*  --   PLT 458*  --   --   --   --  467* 407*  --   LABPROT 19.0*  --   --   --  20.2*  --  23.6*  --   INR 1.6*  --   --   --  1.8*  --  2.1*  --   HEPARINUNFRC <0.10*   < > 0.99* 1.72*  --  0.54  --   --   CREATININE 5.22*  --   --   --  6.21*  --   --  6.18*   < > = values in this interval not displayed.    Estimated Creatinine Clearance: 27 mL/min (A) (by C-G formula based on SCr of 6.18 mg/dL (H)).   Medications:  Scheduled:  . sodium chloride   Intravenous Once  . Chlorhexidine Gluconate Cloth  6 each Topical Q0600  . influenza vac split quadrivalent PF  0.5 mL Intramuscular Tomorrow-1000  . montelukast  10 mg Oral Daily  . nortriptyline  25 mg Oral QHS  . pantoprazole  40 mg Oral BID AC  . sertraline  100 mg Oral BID  . sevelamer carbonate  800 mg Oral TID WC  . topiramate  50 mg Oral Daily    Assessment: 43 yr old female on Coumadin PTA for hx DVT 05/24/19.  INR 8.1 on admit and Coumadin held. Vitamin K 5 mg PO given on 9/7.  IV heparin begun on 9/8 pm when INR down to 1.7  9/12: Status post-op TDC placement yesterday afternoon; heparin has been running at 2900 units/hr since then. Yesterday mornings heparin level therapeutic at 0.56. Called and talked with nurse who  stated the patient has said she is menstruating.  IV team drew heparin level today (from midline) at 10:35 this morning which resulted with 0.10; this is subtherapeutic. Unsure of this value to be truly sub-therapeutic but it is possible. Patient's hgb low at 6.6, and will receive pRBC today. Patient's INR today is 2.1, platelets 407.   Goal of Therapy:  Heparin level 0.3-0.7 units/ml Monitor platelets by anticoagulation protocol: Yes    Plan:   Continue heparin drip at 2900 units/hr  Follow-up repeat heparin level in 6 hours at 1700.  Daily heparin level, PT/INR and CBC  Thank you for the interesting consult and for involving pharmacy in this patient's care.  Tamela Gammon, PharmD 07/26/2019 12:28 PM PGY-2 Pharmacy Administration Resident Direct Phone: [phone] Please check [email or web address] for unit-specific pharmacist phone numbers

## 2019-07-26 NOTE — Progress Notes (Addendum)
Midline to right upper arm was leaking, removed by IV team and replaced it peripheral line. Blood transfusion started at 1545.  Several ecchymosis to RLE, hard to touch. Blood transfusion completed, no transfusion reaction noted.

## 2019-07-26 NOTE — Plan of Care (Signed)
  Problem: Activity: Goal: Risk for activity intolerance will decrease Outcome: Progressing   Problem: Coping: Goal: Level of anxiety will decrease Outcome: Progressing   Problem: Skin Integrity: Goal: Risk for impaired skin integrity will decrease Outcome: Progressing   

## 2019-07-26 NOTE — Progress Notes (Signed)
  Echocardiogram 2D Echocardiogram has been performed.  Megan Ortiz M 07/26/2019, 3:08 PM

## 2019-07-26 NOTE — Progress Notes (Signed)
   Catheter in place confirmed with xray. [place] for use. If patient needs permanent access please call us back although will be high risk for primary non-function of any hd access given her obesity.   Aundrey Elahi C. Donzetta Matters, MD [facility] Office: [phone] Pager: [phone]

## 2019-07-26 NOTE — Plan of Care (Signed)
  Problem: Education: Goal: Knowledge of General Education information will improve Description: Including pain rating scale, medication(s)/side effects and non-pharmacologic comfort measures Outcome: Progressing   Problem: Clinical Measurements: Goal: Ability to maintain clinical measurements within normal limits will improve Outcome: Progressing Goal: Will remain free from infection Outcome: Progressing Goal: Diagnostic test results will improve Outcome: Progressing   Problem: Activity: Goal: Risk for activity intolerance will decrease Outcome: Progressing   Problem: Nutrition: Goal: Adequate nutrition will be maintained Outcome: Progressing   Problem: Coping: Goal: Level of anxiety will decrease Outcome: Progressing   Problem: Pain Managment: Goal: General experience of comfort will improve Outcome: Progressing   Problem: Safety: Goal: Ability to remain free from injury will improve Outcome: Progressing   Problem: Skin Integrity: Goal: Risk for impaired skin integrity will decrease Outcome: Progressing   

## 2019-07-26 NOTE — Anesthesia Postprocedure Evaluation (Signed)
Anesthesia Post Note  Patient: Megan Ortiz  Procedure(s) Performed: INSERTION OF TUNNEL DIALYSIS CATHETER (Right Chest)     Patient location during evaluation: PACU Anesthesia Type: General Level of consciousness: awake and alert Pain management: pain level controlled Vital Signs Assessment: post-procedure vital signs reviewed and stable Respiratory status: spontaneous breathing, nonlabored ventilation, respiratory function stable and patient connected to nasal cannula oxygen Cardiovascular status: blood pressure returned to baseline and stable Postop Assessment: no apparent nausea or vomiting Anesthetic complications: no    Last Vitals:  Vitals:   07/26/19 0354 07/26/19 0737  BP: (!) 96/42 (!) 90/31  Pulse: 98 (!) 111  Resp:  17  Temp: 37.3 C 36.8 C  SpO2: 93% 90%    Last Pain:  Vitals:   07/26/19 0821  TempSrc:   PainSc: 10-Worst pain ever                 Tiajuana Amass

## 2019-07-26 NOTE — Progress Notes (Signed)
CRITICAL VALUE ALERT  Critical Value:  Hgb 6.6  Date & Time Notied:  07/26/19 1045  Provider Notified: Dr Cyndia Skeeters  Orders Received/Actions taken: Will transfuse 1 unit of PC today.

## 2019-07-26 NOTE — Progress Notes (Addendum)
PROGRESS NOTE  Megan Ortiz [id number] DOB: 07-10-1976   PCP: Larene Beach, MD  Patient is from: Home  DOA: 07/19/2019 LOS: 7  Brief Narrative / Interim history: 43 year old female with morbid obesity, recent RLE DVT in 05/2019 on Coumadin and history of necrotizing fasciitis with debridement of buttock and gluteal wound in 2018 presenting with RLE pain, induration, ecchymosis and tenderness over the medial aspect.  Not able to obtain CT due to body habitus. Ultrasound imaging did not show any underlying fluid collections.    She was a started on vancomycin and Zosyn.  Developed renal failure with significant uptrend in her creatinine. Antibiotic de-escalated to IV Rocephin.  However, renal function continued to get worse.  On 9/10-nephrology consulted for assistance.  Had [facility] placed on 9/11 by [facility].  Subjective: No major events overnight of this morning.  Continues to have soft blood pressure with mild tachycardia.  Hemoglobin dropped to 6.6.  No obvious source of bleeding but she is on anticoagulation.  She is not quite sure about melena or hematochezia.  Also not sure if she is making urine or not.  Denies chest pain, dyspnea, abdominal pain or UTI symptoms. Still with some pain in her right lower extremity.  Objective: Vitals:   07/26/19 0354 07/26/19 0737 07/26/19 1150 07/26/19 1200  BP: (!) 96/42 (!) 90/31 (!) 104/53   Pulse: 98 (!) 111 (!) 109   Resp:  17  16  Temp: 99.1 F (37.3 C) 98.3 F (36.8 C)  98.5 F (36.9 C)  TempSrc: Axillary Oral  Oral  SpO2: 93% 90% 93%   Weight:      Height:        Intake/Output Summary (Last 24 hours) at 07/26/2019 1425 Last data filed at 07/26/2019 0900 Gross per 24 hour  Intake 760 ml  Output 1600 ml  Net -840 ml   Filed Weights   07/25/19 1450 07/25/19 2210 07/26/19 0021  Weight: (!) 251 kg (!) 283.6 kg (!) 282.3 kg    Examination:  GENERAL: No acute distress.  Morbidly obese. HEENT: MMM.  No dentition.  Vision and  hearing grossly intact.  NECK: Supple.  No apparent JVD.  RESP:  No IWOB.  Fair aeration bilaterally but limited exam due to body habitus.. CVS: Slightly tachycardic. Heart sounds normal.  ABD/GI/GU: Bowel sounds present. Soft. Non tender.  MSK/EXT: Induration and erythema with ecchymosis over medial aspect of RLE-unchanged.  TTP.  See picture below SKIN: As above. NEURO: Awake, alert and oriented appropriately.  No gross deficit.  PSYCH: Calm. Normal affect.   On 07/25/2019    On 07/24/2019    On 07/19/2019      Assessment & Plan: Post thrombotic syndrome versus cellulitis of RLE: I think patient has post thrombotic syndrome from recent DVT.  She has no constitutional symptoms or leukocytosis on arrival although she is developing leukocytosis now.  However, tenderness and induration.  She did not show significant change with antibiotic but could be early.  Blood cultures negative.  ABI noncompressible arteries bilaterally.  TBI normal bilaterally. -Vancomycin and Zosyn 9/5-9/8 -Ceftriaxone 9/8-9/10 -Ancef 9/11>>  Right lower extremity DVT: Diagnosed in 05/2019.  Has been on Coumadin likely due to body habitus.  INR therapeutic today. -On warfarin with heparin bridge per pharmacy.  Acute on chronic anemia: acute drop likely dilutional vs active bleeding due to IV fluid. -Hgb 8.6 (admit & b/l)>>7.1>>6.6 -IV Feraheme 9/10 -Transfuse 1 unit.  AKI/azotemia/BMD: suspect ATN from IV antibiotics (vancomycin and Zosyn).  She  is on lisinopril at home.  Also received Lasix here.  Also had some low blood pressures but not reliable due to her body habitus.  Renal ultrasound, cortisol, TSH not impressive.  UA with moderate Hgb and 100 protein. FENA 2.2% suggesting intrinsic etiology.  Creatinine plateauing. -Cr 0.62 (admit)>> 2.08>> 5.22>> 6.21>6.18.  BUN 45>40. -Continue IV fluid -Appreciate guidance by nephrology -TDC on 9/11.  Essential hypertension: soft BPs but not sure if those  measurements are reliable due to body habitus.  Not symptomatic. -Continue holding lisinopril in the setting of AKI -IV fluid as above  GERD:  -On PPI.  This could potentially contribute to her AKI.  Mood disorder: Stable. -Continue home medication  Morbid obesity: BMI 104 -Needs lifestyle change to lose weight. -May benefit from bariatric surgery outpatient.  DVT prophylaxis: On warfarin with heparin bridge for DVT treatment Code Status: Full code Family Communication: Patient and/or RN. Available if any question.  Disposition Plan: Remains inpatient for AKI Consultants: Nephrology, vascular surgery  Procedures:  9/11-TDC  Microbiology summarized: SARS-CoV-2 negative. Blood cultures negative.  Antimicrobials: Anti-infectives (From admission, onward)   Start     Dose/Rate Route Frequency Ordered Stop   07/25/19 2200  ceFAZolin (ANCEF) IVPB 2g/100 mL premix  Status:  Discontinued     2 g 200 mL/hr over 30 Minutes Intravenous Every 24 hours 07/25/19 1311 07/25/19 1333   07/25/19 2200  ceFAZolin (ANCEF) IVPB 2g/100 mL premix     2 g 200 mL/hr over 30 Minutes Intravenous Every 24 hours 07/25/19 1334     07/25/19 1345  ceFAZolin (ANCEF) IVPB 2g/100 mL premix  Status:  Discontinued     2 g 200 mL/hr over 30 Minutes Intravenous Every 12 hours 07/25/19 1333 07/25/19 1334   07/25/19 1100  ceFAZolin (ANCEF) IVPB 2g/100 mL premix  Status:  Discontinued     2 g 200 mL/hr over 30 Minutes Intravenous Every 12 hours 07/25/19 1023 07/25/19 1311   07/22/19 1730  cefTRIAXone (ROCEPHIN) 2 g in sodium chloride 0.9 % 100 mL IVPB  Status:  Discontinued     2 g 200 mL/hr over 30 Minutes Intravenous Every 24 hours 07/22/19 1704 07/24/19 1314   07/20/19 0615  vancomycin (VANCOCIN) 1,500 mg in sodium chloride 0.9 % 500 mL IVPB  Status:  Discontinued     1,500 mg 250 mL/hr over 120 Minutes Intravenous Every 8 hours 07/20/19 0610 07/22/19 0825   07/19/19 2200  Vancomycin (VANCOCIN) 1,500 mg in  sodium chloride 0.9 % 500 mL IVPB  Status:  Discontinued     1,500 mg 250 mL/hr over 120 Minutes Intravenous Every 8 hours 07/19/19 1846 07/20/19 0606   07/19/19 2130  piperacillin-tazobactam (ZOSYN) IVPB 3.375 g  Status:  Discontinued     3.375 g 100 mL/hr over 30 Minutes Intravenous  Once 07/19/19 2122 07/19/19 2127   07/19/19 2130  vancomycin (VANCOCIN) IVPB 1000 mg/200 mL premix  Status:  Discontinued     1,000 mg 200 mL/hr over 60 Minutes Intravenous  Once 07/19/19 2122 07/19/19 2127   07/19/19 2130  piperacillin-tazobactam (ZOSYN) IVPB 3.375 g  Status:  Discontinued     3.375 g 12.5 mL/hr over 240 Minutes Intravenous Every 8 hours 07/19/19 1846 07/22/19 1704   07/19/19 1230  vancomycin (VANCOCIN) IVPB 1000 mg/200 mL premix     1,000 mg 200 mL/hr over 60 Minutes Intravenous Every 1 hr x 2 07/19/19 1215 07/19/19 1523   07/19/19 1230  piperacillin-tazobactam (ZOSYN) IVPB 3.375 g  3.375 g 12.5 mL/hr over 240 Minutes Intravenous  Once 07/19/19 1215 07/19/19 1323      Sch Meds:  Scheduled Meds:  sodium chloride   Intravenous Once   Chlorhexidine Gluconate Cloth  6 each Topical Q0600   influenza vac split quadrivalent PF  0.5 mL Intramuscular Tomorrow-1000   montelukast  10 mg Oral Daily   nortriptyline  25 mg Oral QHS   pantoprazole  40 mg Oral BID AC   sertraline  100 mg Oral BID   sevelamer carbonate  800 mg Oral TID WC   topiramate  50 mg Oral Daily   Continuous Infusions:   ceFAZolin (ANCEF) IV 2 g (07/25/19 2338)   heparin 2,900 Units/hr (07/26/19 0542)   PRN Meds:.acetaminophen **OR** acetaminophen, albuterol, ondansetron **OR** ondansetron (ZOFRAN) IV, oxyCODONE, sodium chloride flush   I have personally reviewed the following labs and images: CBC: Recent Labs  Lab 07/22/19 0426 07/23/19 0419 07/24/19 0508 07/25/19 0930 07/26/19 1031  WBC 11.6* 13.9* 17.2* 16.7* 14.1*  HGB 7.5* 7.1* 7.6* 7.2* 6.6*  HCT 26.2* 23.7* 25.4* 24.6* 22.5*  MCV 89.1  88.4 88.2 90.1 89.3  PLT 422* 395 458* 467* 407*   BMP &GFR Recent Labs  Lab 07/22/19 0426 07/23/19 0419 07/24/19 0508 07/25/19 0347 07/26/19 1054  NA 135 132* 133* 134* 136  K 4.2 4.4 4.7 5.0 4.4  CL 102 101 102 105 104  CO2 22 19* 17* 16* 19*  GLUCOSE 118* 98 98 93 91  BUN 23* 32* 39* 45* 40*  CREATININE 2.08* 3.76* 5.22* 6.21* 6.18*  CALCIUM 9.1 8.4* 8.3* 8.4* 8.0*  PHOS  --   --   --  7.1* 6.5*   Estimated Creatinine Clearance: 27 mL/min (A) (by C-G formula based on SCr of 6.18 mg/dL (H)). Liver & Pancreas: Recent Labs  Lab 07/25/19 0347 07/26/19 1054  ALBUMIN 2.2* 2.1*   No results for input(s): LIPASE, AMYLASE in the last 168 hours. No results for input(s): AMMONIA in the last 168 hours. Diabetic: No results for input(s): HGBA1C in the last 72 hours. No results for input(s): GLUCAP in the last 168 hours. Cardiac Enzymes: No results for input(s): CKTOTAL, CKMB, CKMBINDEX, TROPONINI in the last 168 hours. No results for input(s): PROBNP in the last 8760 hours. Coagulation Profile: Recent Labs  Lab 07/22/19 2006 07/23/19 0419 07/24/19 0508 07/25/19 0347 07/26/19 1031  INR 1.7* 1.6* 1.6* 1.8* 2.1*   Thyroid Function Tests: Recent Labs    07/25/19 0930  TSH 1.317   Lipid Profile: No results for input(s): CHOL, HDL, LDLCALC, TRIG, CHOLHDL, LDLDIRECT in the last 72 hours. Anemia Panel: Recent Labs    07/24/19 0911  VITAMINB12 832  FOLATE 11.2  FERRITIN 77  TIBC 211*  IRON 10*  RETICCTPCT 1.1   Urine analysis:    Component Value Date/Time   COLORURINE YELLOW 07/25/2019 0635   APPEARANCEUR TURBID (A) 07/25/2019 0635   LABSPEC 1.019 07/25/2019 0635   PHURINE 5.0 07/25/2019 0635   GLUCOSEU NEGATIVE 07/25/2019 0635   HGBUR MODERATE (A) 07/25/2019 0635   BILIRUBINUR NEGATIVE 07/25/2019 0635   KETONESUR NEGATIVE 07/25/2019 0635   PROTEINUR 100 (A) 07/25/2019 0635   UROBILINOGEN 0.2 05/11/2013 1903   NITRITE NEGATIVE 07/25/2019 0635    LEUKOCYTESUR NEGATIVE 07/25/2019 0635   Sepsis Labs: Invalid input(s): PROCALCITONIN, Nectar  Microbiology: Recent Results (from the past 240 hour(s))  Blood culture (routine x 2)     Status: None   Collection Time: 07/19/19 11:52 AM   Specimen:  Right Antecubital; Blood  Result Value Ref Range Status   Specimen Description   Final    RIGHT ANTECUBITAL BOTTLES DRAWN AEROBIC AND ANAEROBIC   Special Requests Blood Culture adequate volume  Final   Culture   Final    NO GROWTH 5 DAYS Performed at [facility], [address] [place], [place] [address]    Report Status 07/24/2019 FINAL  Final  Blood culture (routine x 2)     Status: None   Collection Time: 07/19/19 12:27 PM   Specimen: Left Antecubital; Blood  Result Value Ref Range Status   Specimen Description   Final    LEFT ANTECUBITAL BOTTLES DRAWN AEROBIC AND ANAEROBIC   Special Requests Blood Culture adequate volume  Final   Culture   Final    NO GROWTH 5 DAYS Performed at [facility], [address] [place], [place] [address]    Report Status 07/24/2019 FINAL  Final  SARS Coronavirus 2 [facility] order, Performed in [facility] hospital lab) Nasopharyngeal Nasopharyngeal Swab     Status: None   Collection Time: 07/19/19 12:50 PM   Specimen: Nasopharyngeal Swab  Result Value Ref Range Status   SARS Coronavirus 2 NEGATIVE NEGATIVE Final    Comment: (NOTE) If result is NEGATIVE SARS-CoV-2 target nucleic acids are NOT DETECTED. The SARS-CoV-2 RNA is generally detectable in upper and lower  respiratory specimens during the acute phase of infection. The lowest  concentration of SARS-CoV-2 viral copies this assay can detect is 250  copies / mL. A negative result does not preclude SARS-CoV-2 infection  and should not be used as the sole basis for treatment or other  patient management decisions.  A negative result may occur with  improper specimen collection / handling, submission of specimen other  than nasopharyngeal  swab, presence of viral mutation(s) within the  areas targeted by this assay, and inadequate number of viral copies  (<250 copies / mL). A negative result must be combined with clinical  observations, patient history, and epidemiological information. If result is POSITIVE SARS-CoV-2 target nucleic acids are DETECTED. The SARS-CoV-2 RNA is generally detectable in upper and lower  respiratory specimens dur ing the acute phase of infection.  Positive  results are indicative of active infection with SARS-CoV-2.  Clinical  correlation with patient history and other diagnostic information is  necessary to determine patient infection status.  Positive results do  not rule out bacterial infection or co-infection with other viruses. If result is PRESUMPTIVE POSTIVE SARS-CoV-2 nucleic acids MAY BE PRESENT.   A presumptive positive result was obtained on the submitted specimen  and confirmed on repeat testing.  While 2019 novel coronavirus  (SARS-CoV-2) nucleic acids may be present in the submitted sample  additional confirmatory testing may be necessary for epidemiological  and / or clinical management purposes  to differentiate between  SARS-CoV-2 and other Sarbecovirus currently known to infect humans.  If clinically indicated additional testing with an alternate test  methodology [phone]) is advised. The SARS-CoV-2 RNA is generally  detectable in upper and lower respiratory sp ecimens during the acute  phase of infection. The expected result is Negative. Fact Sheet for Patients:  StrictlyIdeas.no Fact Sheet for Healthcare Providers: BankingDealers.co.za This test is not yet approved or cleared by the [place] FDA and has been authorized for detection and/or diagnosis of SARS-CoV-2 by FDA under an Emergency Use Authorization (EUA).  This EUA will remain in effect (meaning this test can be used) for the duration of the COVID-19 declaration  under Section 564(b)(1) of the Act, 21 U.S.C. section 360bbb-3(b)(1), unless the authorization is terminated or revoked sooner. Performed at [facility], [address], [place], [place] [address]   Surgical pcr screen     Status: None   Collection Time: 07/25/19  2:10 PM   Specimen: Nasal Mucosa; Nasal Swab  Result Value Ref Range Status   MRSA, PCR NEGATIVE NEGATIVE Final   Staphylococcus aureus NEGATIVE NEGATIVE Final    Comment: (NOTE) The Xpert SA Assay (FDA approved for NASAL specimens in patients 20 years of age and older), is one component of a comprehensive surveillance program. It is not intended to diagnose infection nor to guide or monitor treatment. Performed at [facility], [place] [address], [place], [place] [address]     Radiology Studies: X-ray Chest Pa Or Ap  Result Date: 07/25/2019 CLINICAL DATA:  Patient is rotated to the left. Recent dialysis catheter insertion EXAM: CHEST  1 VIEW COMPARISON:  Chest radiograph July 19, 2019 FINDINGS: Central venous catheter tip projects over the mediastinum, and sac location unable to be determined given patient rotation. Stable cardiomegaly. Similar bilateral interstitial pulmonary opacities. No pleural effusion or pneumothorax. IMPRESSION: New central venous catheter with tip projecting over the mediastinum, location of the tip unable to be exactly determine given marked patient rotation. Cardiomegaly and mild interstitial edema. Electronically Signed   By: Lovey Newcomer M.D.   On: 07/25/2019 18:52   Dg Fluoro Guide Cv Line-no Report  Result Date: 07/25/2019 Fluoroscopy was utilized by the requesting physician.  No radiographic interpretation.    Sajid Ruppert T. [place]  If 7PM-7AM, please contact night-coverage [email or web address] Password [email or web address] 07/26/2019, 2:25 PM

## 2019-07-27 ENCOUNTER — Encounter (HOSPITAL_COMMUNITY): Payer: Self-pay | Admitting: Vascular Surgery

## 2019-07-27 LAB — TYPE AND SCREEN
ABO/RH(D): O POS
Antibody Screen: NEGATIVE
Unit division: 0

## 2019-07-27 LAB — RENAL FUNCTION PANEL
Albumin: 2.2 g/dL — ABNORMAL LOW (ref 3.5–5.0)
Anion gap: 15 (ref 5–15)
BUN: 45 mg/dL — ABNORMAL HIGH (ref 6–20)
CO2: 17 mmol/L — ABNORMAL LOW (ref 22–32)
Calcium: 8.3 mg/dL — ABNORMAL LOW (ref 8.9–10.3)
Chloride: 104 mmol/L (ref 98–111)
Creatinine, Ser: 7.15 mg/dL — ABNORMAL HIGH (ref 0.44–1.00)
GFR calc Af Amer: 7 mL/min — ABNORMAL LOW (ref >60)
GFR calc non Af Amer: 6 mL/min — ABNORMAL LOW (ref >60)
Glucose, Bld: 96 mg/dL (ref 70–99)
Phosphorus: 7.2 mg/dL — ABNORMAL HIGH (ref 2.5–4.6)
Potassium: 4.7 mmol/L (ref 3.5–5.1)
Sodium: 136 mmol/L (ref 135–145)

## 2019-07-27 LAB — CBC
HCT: 26.3 % — ABNORMAL LOW (ref 36.0–46.0)
Hemoglobin: 7.7 g/dL — ABNORMAL LOW (ref 12.0–15.0)
MCH: 26.3 pg (ref 26.0–34.0)
MCHC: 29.3 g/dL — ABNORMAL LOW (ref 30.0–36.0)
MCV: 89.8 fL (ref 80.0–100.0)
Platelets: 486 10*3/uL — ABNORMAL HIGH (ref 150–400)
RBC: 2.93 MIL/uL — ABNORMAL LOW (ref 3.87–5.11)
RDW: 22 % — ABNORMAL HIGH (ref 11.5–15.5)
WBC: 15.9 10*3/uL — ABNORMAL HIGH (ref 4.0–10.5)
nRBC: 0.2 % (ref 0.0–0.2)

## 2019-07-27 LAB — HEMOGLOBIN AND HEMATOCRIT, BLOOD
HCT: 25.1 % — ABNORMAL LOW (ref 36.0–46.0)
Hemoglobin: 7.6 g/dL — ABNORMAL LOW (ref 12.0–15.0)

## 2019-07-27 LAB — SEDIMENTATION RATE: Sed Rate: >140 mm/hr — ABNORMAL HIGH (ref 0–22)

## 2019-07-27 LAB — HEPARIN LEVEL (UNFRACTIONATED)
Heparin Unfractionated: <0.1 IU/mL — ABNORMAL LOW (ref 0.30–0.70)
Heparin Unfractionated: <0.1 IU/mL — ABNORMAL LOW (ref 0.30–0.70)

## 2019-07-27 LAB — MAGNESIUM: Magnesium: 1.9 mg/dL (ref 1.7–2.4)

## 2019-07-27 LAB — BPAM RBC
Blood Product Expiration Date: 202010172359
ISSUE DATE / TIME: 202009121537
Unit Type and Rh: 5100

## 2019-07-27 LAB — PROTIME-INR
INR: 2 — ABNORMAL HIGH (ref 0.8–1.2)
Prothrombin Time: 22.2 seconds — ABNORMAL HIGH (ref 11.4–15.2)

## 2019-07-27 MED ORDER — HEPARIN SODIUM (PORCINE) 1000 UNIT/ML IJ SOLN
INTRAMUSCULAR | Status: AC
  Filled 2019-07-27: qty 3

## 2019-07-27 MED ORDER — SEVELAMER CARBONATE 800 MG PO TABS
1600.0000 mg | ORAL_TABLET | Freq: Three times a day (TID) | ORAL | Status: DC
  Administered 2019-07-27 – 2019-08-06 (×30): 1600 mg via ORAL
  Filled 2019-07-27 (×31): qty 2

## 2019-07-27 NOTE — Progress Notes (Signed)
Patient returns from dialysis via bed.  Alert.  No distress noted.  No complaints or needs when checked.  Meal tray is at the bedside.

## 2019-07-27 NOTE — Progress Notes (Signed)
ANTICOAGULATION CONSULT NOTE - Follow Up Consult  Pharmacy Consult for Heparin (Warfarin on hold) Indication: Recent DVT (05/24/19)  Allergies  Allergen Reactions  . Aspirin     Due to hx of stomach ulcers    Patient Measurements: Height: 5\' 5"  (165.1 cm) Weight: (!) 622 lb 5.7 oz (282.3 kg) IBW/kg (Calculated) : 57 Heparin Dosing Weight: 125.2  Vital Signs: Temp: 97.8 F (36.6 C) (09/13 0834) Temp Source: Oral (09/13 0834) BP: 114/39 (09/13 0834) Pulse Rate: 101 (09/13 0834)  Labs: Recent Labs    07/25/19 0347  07/25/19 0930 07/26/19 1031 07/26/19 1054 07/26/19 2142 07/27/19 0342  HGB  --    < > 7.2* 6.6*  --  7.6* 7.7*  HCT  --    < > 24.6* 22.5*  --  25.4* 26.3*  PLT  --   --  467* 407*  --   --  486*  LABPROT 20.2*  --   --  23.6*  --   --  22.2*  INR 1.8*  --   --  2.1*  --   --  2.0*  HEPARINUNFRC  --   --  0.54 <0.10*  --  <0.10* <0.10*  CREATININE 6.21*  --   --   --  6.18*  --  7.15*   < > = values in this interval not displayed.    Estimated Creatinine Clearance: 23.3 mL/min (A) (by C-G formula based on SCr of 7.15 mg/dL (H)).   Medications:  Scheduled:  . Chlorhexidine Gluconate Cloth  6 each Topical Q0600  . influenza vac split quadrivalent PF  0.5 mL Intramuscular Tomorrow-1000  . montelukast  10 mg Oral Daily  . nortriptyline  25 mg Oral QHS  . pantoprazole  40 mg Oral BID AC  . sertraline  100 mg Oral BID  . sevelamer carbonate  800 mg Oral TID WC  . topiramate  50 mg Oral Daily    Assessment: 43 yr old female on Coumadin PTA for hx DVT 05/24/19.  INR 8.1 on admit and Coumadin held. Vitamin K 5 mg PO given on 9/7.  - IV heparin begun on 9/8 pm when INR down to 1.7. s/p TDC placement on 9/11. INR today is 2.0. - Heparin has been running 2900 units/hr since 9/11.  - One level has been therapeutic (9/11) and multiple non-therapeutic since then.  - Heparin level this morning came back undetected again.... I spoke with patient this afternoon and  it was assessed that the IV smart pump has been beeping consistently the past few days. I believe the IV line may be kinking or infusion has not done properly in the proximal hand location. I then spoke with the RN today and the RN stated the IV smart pump has not beeped today. I believe the issue is tied to the placement of the IV lines in her proximal hands, we are going to change from her right hand to her left hand for heparin administration and keep the rate the same. I will order a level 8 hours from change IV line to assess if this has impacted infusion. Patient is a hemodialysis patient, hgb 7.7 today, hct 26.3, platelets 486.   Goal of Therapy:  Heparin level 0.3-0.7 units/ml Monitor platelets by anticoagulation protocol: Yes    Plan:   Continue heparin drip at 2900 units/hr  Follow-up repeat heparin level in 8 hours at 2200.  Daily heparin level, PT/INR and CBC  Thank you for the interesting consult and for  involving pharmacy in this patient's care.  Tamela Gammon, PharmD 07/27/2019 11:22 AM PGY-2 Pharmacy Administration Resident Direct Phone: [phone] Please check [email or web address] for unit-specific pharmacist phone numbers

## 2019-07-27 NOTE — Progress Notes (Signed)
PROGRESS NOTE  Megan Ortiz [id number] DOB: 11-17-1975   PCP: Larene Beach, MD  Patient is from: Home  DOA: 07/19/2019 LOS: 8  Brief Narrative / Interim history: Patient is a 43 year old Caucasian female, morbidly obese, with recent RLE DVT on 05/2019 on Coumadin, history of necrotizing fasciitis with debridement of buttock and gluteal wound in 2018.  Patient presented with RLE pain, induration, ecchymosis and tenderness over the medial aspect.  Not able to obtain CT due to body habitus. Ultrasound imaging did not show any underlying fluid collections.    She was a started on vancomycin and Zosyn.  During the course of this hospitalization, patient has developed severe acute kidney injury.  Tunneled hemodialysis catheter has been placed for likely renal replacement therapy.  Nephrology team's input is appreciated.  Patient is currently on heparin drip.  Coumadin is currently on hold.  On presentation, INR range from 8.1 to 9.9.  INR checked earlier today was 2.  Subjective: No new complaints. No fever or chills No chest pain No shortness of breath  Objective: Vitals:   07/27/19 1345 07/27/19 1400 07/27/19 1415 07/27/19 1444  BP: (!) 58/34 (!) 78/44 (!) 94/38 (!) 92/46  Pulse: 99 100 (!) 105 (!) 108  Resp:   16 17  Temp:   98.3 F (36.8 C) 98.4 F (36.9 C)  TempSrc:   Oral Oral  SpO2:   98% 93%  Weight:      Height:        Intake/Output Summary (Last 24 hours) at 07/27/2019 1617 Last data filed at 07/27/2019 1500 Gross per 24 hour  Intake 1677.84 ml  Output 1831 ml  Net -153.16 ml   Filed Weights   07/25/19 1450 07/25/19 2210 07/26/19 0021  Weight: (!) 251 kg (!) 283.6 kg (!) 282.3 kg    Examination:  GENERAL: No acute distress.  Morbidly obese. HEENT: Pallor.  No jaundice. NECK: Supple.  No apparent JVD.  RESP: Clear to auscultation. CVS: S1-S2. ABD: Morbidly obese.  Organs are difficult to assess.   MSK/EXT: Right lower extremity is swollen with  discoloration as depicted above, likely secondary to elevated INR/Coumadin therapy.  SKIN: As above. NEURO: Awake, alert and oriented appropriately.  Patient moves all extremities.  On 07/25/2019    On 07/24/2019    On 07/19/2019      Assessment & Plan: Skin changes right lower extremity: This likely related to elevated INR and bruising. INR will peaked at 9.9. Doubt leukocytoclastic rash.  Right lower extremity DVT:  Diagnosed in 05/2019.   Patient was started on Coumadin.   INR was supra elevated on presentation, 8.1 and peaked at 9.9.   Coumadin is on hold.   Patient is currently on heparin drip.   INR today is 2.    Acute on chronic anemia:  Possibly due to acute blood loss.   Continue to monitor H/H.   Further management depend on hospital course.   Hemoglobin today 7.7 g/dL.  AKI: Nephrology team is managing.   This is likely multifactorial (will defer to the nephrology team)  -[facility] placed on 07/25/2019 for possible renal replacement therapy.  Essential hypertension:  Blood pressure has been on the low side, with episodes of hypotension.   Hopefully, patient is not bleeding internally.   Continue to monitor H&H closely and heart rate.    GERD:  -On PPI.  This could potentially contribute to her AKI.  Mood disorder:  Stable. -Continue home medication  Morbid obesity: BMI 104 -Needs lifestyle  change to lose weight. -May benefit from bariatric surgery outpatient.  DVT prophylaxis: On heparin bridge for DVT treatment Code Status: Full code Family Communication: Husband.   Disposition Plan: Remains inpatient for AKI Consultants: Nephrology, vascular surgery  Procedures:  9/11-TDC  Microbiology summarized: SARS-CoV-2 negative. Blood cultures negative.  Antimicrobials: Anti-infectives (From admission, onward)   Start     Dose/Rate Route Frequency Ordered Stop   07/25/19 2200  ceFAZolin (ANCEF) IVPB 2g/100 mL premix  Status:  Discontinued     2 g  200 mL/hr over 30 Minutes Intravenous Every 24 hours 07/25/19 1311 07/25/19 1333   07/25/19 2200  ceFAZolin (ANCEF) IVPB 2g/100 mL premix     2 g 200 mL/hr over 30 Minutes Intravenous Every 24 hours 07/25/19 1334     07/25/19 1345  ceFAZolin (ANCEF) IVPB 2g/100 mL premix  Status:  Discontinued     2 g 200 mL/hr over 30 Minutes Intravenous Every 12 hours 07/25/19 1333 07/25/19 1334   07/25/19 1100  ceFAZolin (ANCEF) IVPB 2g/100 mL premix  Status:  Discontinued     2 g 200 mL/hr over 30 Minutes Intravenous Every 12 hours 07/25/19 1023 07/25/19 1311   07/22/19 1730  cefTRIAXone (ROCEPHIN) 2 g in sodium chloride 0.9 % 100 mL IVPB  Status:  Discontinued     2 g 200 mL/hr over 30 Minutes Intravenous Every 24 hours 07/22/19 1704 07/24/19 1314   07/20/19 0615  vancomycin (VANCOCIN) 1,500 mg in sodium chloride 0.9 % 500 mL IVPB  Status:  Discontinued     1,500 mg 250 mL/hr over 120 Minutes Intravenous Every 8 hours 07/20/19 0610 07/22/19 0825   07/19/19 2200  Vancomycin (VANCOCIN) 1,500 mg in sodium chloride 0.9 % 500 mL IVPB  Status:  Discontinued     1,500 mg 250 mL/hr over 120 Minutes Intravenous Every 8 hours 07/19/19 1846 07/20/19 0606   07/19/19 2130  piperacillin-tazobactam (ZOSYN) IVPB 3.375 g  Status:  Discontinued     3.375 g 100 mL/hr over 30 Minutes Intravenous  Once 07/19/19 2122 07/19/19 2127   07/19/19 2130  vancomycin (VANCOCIN) IVPB 1000 mg/200 mL premix  Status:  Discontinued     1,000 mg 200 mL/hr over 60 Minutes Intravenous  Once 07/19/19 2122 07/19/19 2127   07/19/19 2130  piperacillin-tazobactam (ZOSYN) IVPB 3.375 g  Status:  Discontinued     3.375 g 12.5 mL/hr over 240 Minutes Intravenous Every 8 hours 07/19/19 1846 07/22/19 1704   07/19/19 1230  vancomycin (VANCOCIN) IVPB 1000 mg/200 mL premix     1,000 mg 200 mL/hr over 60 Minutes Intravenous Every 1 hr x 2 07/19/19 1215 07/19/19 1523   07/19/19 1230  piperacillin-tazobactam (ZOSYN) IVPB 3.375 g     3.375 g 12.5  mL/hr over 240 Minutes Intravenous  Once 07/19/19 1215 07/19/19 1323      Sch Meds:  Scheduled Meds: . Chlorhexidine Gluconate Cloth  6 each Topical Q0600  . heparin      . influenza vac split quadrivalent PF  0.5 mL Intramuscular Tomorrow-1000  . montelukast  10 mg Oral Daily  . nortriptyline  25 mg Oral QHS  . pantoprazole  40 mg Oral BID AC  . sertraline  100 mg Oral BID  . sevelamer carbonate  1,600 mg Oral TID WC  . topiramate  50 mg Oral Daily   Continuous Infusions: .  ceFAZolin (ANCEF) IV Stopped (07/27/19 0106)  . heparin 2,900 Units/hr (07/27/19 0925)   PRN Meds:.acetaminophen **OR** acetaminophen, albuterol, ondansetron **OR** ondansetron (  ZOFRAN) IV, oxyCODONE, sodium chloride flush   I have personally reviewed the following labs and images: CBC: Recent Labs  Lab 07/23/19 0419 07/24/19 0508 07/25/19 0930 07/26/19 1031 07/26/19 2142 07/27/19 0342  WBC 13.9* 17.2* 16.7* 14.1*  --  15.9*  HGB 7.1* 7.6* 7.2* 6.6* 7.6* 7.7*  HCT 23.7* 25.4* 24.6* 22.5* 25.4* 26.3*  MCV 88.4 88.2 90.1 89.3  --  89.8  PLT 395 458* 467* 407*  --  486*   BMP &GFR Recent Labs  Lab 07/23/19 0419 07/24/19 0508 07/25/19 0347 07/26/19 1054 07/27/19 0342  NA 132* 133* 134* 136 136  K 4.4 4.7 5.0 4.4 4.7  CL 101 102 105 104 104  CO2 19* 17* 16* 19* 17*  GLUCOSE 98 98 93 91 96  BUN 32* 39* 45* 40* 45*  CREATININE 3.76* 5.22* 6.21* 6.18* 7.15*  CALCIUM 8.4* 8.3* 8.4* 8.0* 8.3*  MG  --   --   --   --  1.9  PHOS  --   --  7.1* 6.5* 7.2*   Estimated Creatinine Clearance: 23.3 mL/min (A) (by C-G formula based on SCr of 7.15 mg/dL (H)). Liver & Pancreas: Recent Labs  Lab 07/25/19 0347 07/26/19 1054 07/27/19 0342  ALBUMIN 2.2* 2.1* 2.2*   No results for input(s): LIPASE, AMYLASE in the last 168 hours. No results for input(s): AMMONIA in the last 168 hours. Diabetic: No results for input(s): HGBA1C in the last 72 hours. No results for input(s): GLUCAP in the last 168 hours.  Cardiac Enzymes: No results for input(s): CKTOTAL, CKMB, CKMBINDEX, TROPONINI in the last 168 hours. No results for input(s): PROBNP in the last 8760 hours. Coagulation Profile: Recent Labs  Lab 07/23/19 0419 07/24/19 0508 07/25/19 0347 07/26/19 1031 07/27/19 0342  INR 1.6* 1.6* 1.8* 2.1* 2.0*   Thyroid Function Tests: Recent Labs    07/25/19 0930  TSH 1.317   Lipid Profile: No results for input(s): CHOL, HDL, LDLCALC, TRIG, CHOLHDL, LDLDIRECT in the last 72 hours. Anemia Panel: No results for input(s): VITAMINB12, FOLATE, FERRITIN, TIBC, IRON, RETICCTPCT in the last 72 hours. Urine analysis:    Component Value Date/Time   COLORURINE YELLOW 07/25/2019 0635   APPEARANCEUR TURBID (A) 07/25/2019 0635   LABSPEC 1.019 07/25/2019 0635   PHURINE 5.0 07/25/2019 0635   GLUCOSEU NEGATIVE 07/25/2019 0635   HGBUR MODERATE (A) 07/25/2019 0635   BILIRUBINUR NEGATIVE 07/25/2019 [place] 07/25/2019 0635   PROTEINUR 100 (A) 07/25/2019 0635   UROBILINOGEN 0.2 05/11/2013 1903   NITRITE NEGATIVE 07/25/2019 0635   LEUKOCYTESUR NEGATIVE 07/25/2019 0635   Sepsis Labs: Invalid input(s): PROCALCITONIN, [place]  Microbiology: Recent Results (from the past 240 hour(s))  Blood culture (routine x 2)     Status: None   Collection Time: 07/19/19 11:52 AM   Specimen: Right Antecubital; Blood  Result Value Ref Range Status   Specimen Description   Final    RIGHT ANTECUBITAL BOTTLES DRAWN AEROBIC AND ANAEROBIC   Special Requests Blood Culture adequate volume  Final   Culture   Final    NO GROWTH 5 DAYS Performed at [facility], [address] [place], [place] [address]    Report Status 07/24/2019 FINAL  Final  Blood culture (routine x 2)     Status: None   Collection Time: 07/19/19 12:27 PM   Specimen: Left Antecubital; Blood  Result Value Ref Range Status   Specimen Description   Final    LEFT ANTECUBITAL BOTTLES DRAWN AEROBIC AND ANAEROBIC  Special Requests Blood  Culture adequate volume  Final   Culture   Final    NO GROWTH 5 DAYS Performed at [facility], [address], [place], [place] [address]    Report Status 07/24/2019 FINAL  Final  SARS Coronavirus 2 [facility] order, Performed in [facility] hospital lab) Nasopharyngeal Nasopharyngeal Swab     Status: None   Collection Time: 07/19/19 12:50 PM   Specimen: Nasopharyngeal Swab  Result Value Ref Range Status   SARS Coronavirus 2 NEGATIVE NEGATIVE Final    Comment: (NOTE) If result is NEGATIVE SARS-CoV-2 target nucleic acids are NOT DETECTED. The SARS-CoV-2 RNA is generally detectable in upper and lower  respiratory specimens during the acute phase of infection. The lowest  concentration of SARS-CoV-2 viral copies this assay can detect is 250  copies / mL. A negative result does not preclude SARS-CoV-2 infection  and should not be used as the sole basis for treatment or other  patient management decisions.  A negative result may occur with  improper specimen collection / handling, submission of specimen other  than nasopharyngeal swab, presence of viral mutation(s) within the  areas targeted by this assay, and inadequate number of viral copies  (<250 copies / mL). A negative result must be combined with clinical  observations, patient history, and epidemiological information. If result is POSITIVE SARS-CoV-2 target nucleic acids are DETECTED. The SARS-CoV-2 RNA is generally detectable in upper and lower  respiratory specimens dur ing the acute phase of infection.  Positive  results are indicative of active infection with SARS-CoV-2.  Clinical  correlation with patient history and other diagnostic information is  necessary to determine patient infection status.  Positive results do  not rule out bacterial infection or co-infection with other viruses. If result is PRESUMPTIVE POSTIVE SARS-CoV-2 nucleic acids MAY BE PRESENT.   A presumptive positive result was obtained on the submitted  specimen  and confirmed on repeat testing.  While 2019 novel coronavirus  (SARS-CoV-2) nucleic acids may be present in the submitted sample  additional confirmatory testing may be necessary for epidemiological  and / or clinical management purposes  to differentiate between  SARS-CoV-2 and other Sarbecovirus currently known to infect humans.  If clinically indicated additional testing with an alternate test  methodology [phone]) is advised. The SARS-CoV-2 RNA is generally  detectable in upper and lower respiratory sp ecimens during the acute  phase of infection. The expected result is Negative. Fact Sheet for Patients:  StrictlyIdeas.no Fact Sheet for Healthcare Providers: BankingDealers.co.za This test is not yet approved or cleared by the [place] FDA and has been authorized for detection and/or diagnosis of SARS-CoV-2 by FDA under an Emergency Use Authorization (EUA).  This EUA will remain in effect (meaning this test can be used) for the duration of the COVID-19 declaration under Section 564(b)(1) of the Act, 21 U.S.C. section 360bbb-3(b)(1), unless the authorization is terminated or revoked sooner. Performed at [facility], [address], [place], [place] [address]   Surgical pcr screen     Status: None   Collection Time: 07/25/19  2:10 PM   Specimen: Nasal Mucosa; Nasal Swab  Result Value Ref Range Status   MRSA, PCR NEGATIVE NEGATIVE Final   Staphylococcus aureus NEGATIVE NEGATIVE Final    Comment: (NOTE) The Xpert SA Assay (FDA approved for NASAL specimens in patients 28 years of age and older), is one component of a comprehensive surveillance program. It is not intended to diagnose infection nor to guide or monitor treatment.  Performed at [facility], [place] [address], [place], [place] [address]     Radiology Studies: No results found.  Bonnell Public, M.D. [facility]  If 7PM-7AM, please  contact night-coverage [email or web address] Password [facility] 07/27/2019, 4:17 PM

## 2019-07-27 NOTE — Progress Notes (Signed)
[name]    NEPHROLOGY PROGRESS NOTE  SUBJECTIVE: Complains of feeling foggy and dizzy.  Intermittent hypoxia yesterday.  Reports no urine output.  Denies any cp/sob.  Denies n/v.  All other ROS negative.   OBJECTIVE:  Vitals:   07/27/19 1130 07/27/19 1200  BP: (!) 112/35 (!) 99/39  Pulse: 93 94  Resp:    Temp:    SpO2:      Intake/Output Summary (Last 24 hours) at 07/27/2019 1233 Last data filed at 07/27/2019 0900 Gross per 24 hour  Intake 2049.84 ml  Output -  Net 2049.84 ml      General:  AAOx3 NAD HEENT: MMM [place] AT anicteric sclera Neck:  No JVD, no adenopathy CV:  Heart RRR  Lungs:  L/S CTA bilaterally Abd:  abd SNT/ND with normal BS, morbidly obese GU:  Bladder non-palpable Extremities:  (+) necrotic lesions right leg, (+)2 edema Skin:  No skin rash  MEDICATIONS:  . Chlorhexidine Gluconate Cloth  6 each Topical Q0600  . influenza vac split quadrivalent PF  0.5 mL Intramuscular Tomorrow-1000  . montelukast  10 mg Oral Daily  . nortriptyline  25 mg Oral QHS  . pantoprazole  40 mg Oral BID AC  . sertraline  100 mg Oral BID  . sevelamer carbonate  800 mg Oral TID WC  . topiramate  50 mg Oral Daily       LABS:   CBC Latest Ref Rng & Units 07/27/2019 07/26/2019 07/26/2019  WBC 4.0 - 10.5 K/uL 15.9(H) - 14.1(H)  Hemoglobin 12.0 - 15.0 g/dL 7.7(L) 7.6(L) 6.6(LL)  Hematocrit 36.0 - 46.0 % 26.3(L) 25.4(L) 22.5(L)  Platelets 150 - 400 K/uL 486(H) - 407(H)    CMP Latest Ref Rng & Units 07/27/2019 07/26/2019 07/25/2019  Glucose 70 - 99 mg/dL 96 91 93  BUN 6 - 20 mg/dL 45(H) 40(H) 45(H)  Creatinine 0.44 - 1.00 mg/dL 7.15(H) 6.18(H) 6.21(H)  Sodium 135 - 145 mmol/L 136 136 134(L)  Potassium 3.5 - 5.1 mmol/L 4.7 4.4 5.0  Chloride 98 - 111 mmol/L 104 104 105  CO2 22 - 32 mmol/L 17(L) 19(L) 16(L)  Calcium 8.9 - 10.3 mg/dL 8.3(L) 8.0(L) 8.4(L)  Total Protein 6.5 - 8.1 g/dL - - -  Total Bilirubin 0.3 - 1.2 mg/dL - - -  Alkaline Phos 38 - 126 U/L - - -   AST 15 - 41 U/L - - -  ALT 0 - 44 U/L - - -    Lab Results  Component Value Date   CALCIUM 8.3 (L) 07/27/2019   PHOS 7.2 (H) 07/27/2019       Component Value Date/Time   COLORURINE YELLOW 07/25/2019 0635   APPEARANCEUR TURBID (A) 07/25/2019 0635   LABSPEC 1.019 07/25/2019 0635   PHURINE 5.0 07/25/2019 0635   GLUCOSEU NEGATIVE 07/25/2019 0635   HGBUR MODERATE (A) 07/25/2019 0635   BILIRUBINUR NEGATIVE 07/25/2019 0635   KETONESUR NEGATIVE 07/25/2019 0635   PROTEINUR 100 (A) 07/25/2019 0635   UROBILINOGEN 0.2 05/11/2013 1903   NITRITE NEGATIVE 07/25/2019 0635   LEUKOCYTESUR NEGATIVE 07/25/2019 0635      Component Value Date/Time   PHART 7.371 05/30/2019 1655   PCO2ART 33.4 05/30/2019 1655   PO2ART 79.4 (L) 05/30/2019 1655   HCO3 18.9 (L) 05/30/2019 1655   TCO2 21 02/19/2017 0856   ACIDBASEDEF 5.4 (H) 05/30/2019 1655   O2SAT 96.2 05/30/2019 1655       Component Value Date/Time   IRON 10 (L) 07/24/2019 0911   TIBC  211 (L) 07/24/2019 0911   FERRITIN 77 07/24/2019 0911   IRONPCTSAT 5 (L) 07/24/2019 0911       ASSESSMENT/PLAN:    This is a 43 year old lady with history of morbid obesity recent right lower extremity DVT July 2020.  She been taking Coumadin she has a history of necrotizing fasciitis and debridements of the buttock and gluteal wound in 2018.  She presented 07/19/2019 with right lower extremity pain induration ecchymosis and tenderness of the medial aspect of thigh.  Not able to get a CT scan secondary to body habitus.  She appears to have some acute kidney injury and nephrology was consulted.   Acute kidney injury.   [place] without obstruction.  Likely due to ATN / vanco.  Avoid nephrotoxins ACE inhibitor's ARB use nonsteroidal anti-inflammatory drugs.  Avoid IV contrast.  Avoid intra-arterial procedures.  Worsening renal function in the setting of anuria -started HD on Friday, 07/25/2019.  We will plan additional dialysis today given anuria and progressing  acidosis.  Hypotension.    Will transfuse.    Calciphylaxis/Coumadin necrosis.  Aggressive wound care management.  Will increase sevelamer to manage phosphorus.  Morbid obesity  RLE DVT.  Continue anticoag  Hyperphosphatemia.  We will increase sevelamer.  Anemia.  For PRBCs today.   [name], DO, [place]

## 2019-07-27 NOTE — Progress Notes (Signed)
Continues to have no urine output during the night.  Continue to monitor.

## 2019-07-27 NOTE — Plan of Care (Signed)
  Problem: Education: Goal: Knowledge of General Education information will improve Description Including pain rating scale, medication(s)/side effects and non-pharmacologic comfort measures Outcome: Progressing   Problem: Health Behavior/Discharge Planning: Goal: Ability to manage health-related needs will improve Outcome: Progressing   

## 2019-07-27 NOTE — Progress Notes (Signed)
ANTICOAGULATION CONSULT NOTE - Follow Up Consult  Pharmacy Consult for Heparin (Warfarin on hold) Indication: Recent DVT (05/24/19)  Allergies  Allergen Reactions  . Aspirin     Due to hx of stomach ulcers    Patient Measurements: Height: 5\' 5"  (165.1 cm) Weight: (scale inaccurate) IBW/kg (Calculated) : 57 Heparin Dosing Weight: 125.2  Vital Signs: Temp: 98.4 F (36.9 C) (09/13 1949) Temp Source: Oral (09/13 1949) BP: 105/51 (09/13 1949) Pulse Rate: 103 (09/13 1949)  Labs: Recent Labs    07/25/19 0347  07/25/19 0930 07/26/19 1031 07/26/19 1054 07/26/19 2142 07/27/19 0342 07/27/19 2005  HGB  --    < > 7.2* 6.6*  --  7.6* 7.7* 7.6*  HCT  --    < > 24.6* 22.5*  --  25.4* 26.3* 25.1*  PLT  --   --  467* 407*  --   --  486*  --   LABPROT 20.2*  --   --  23.6*  --   --  22.2*  --   INR 1.8*  --   --  2.1*  --   --  2.0*  --   HEPARINUNFRC  --   --  0.54 <0.10*  --  <0.10* <0.10* <0.10*  CREATININE 6.21*  --   --   --  6.18*  --  7.15*  --    < > = values in this interval not displayed.    Estimated Creatinine Clearance: 23.3 mL/min (A) (by C-G formula based on SCr of 7.15 mg/dL (H)).   Medications:  Scheduled:  . Chlorhexidine Gluconate Cloth  6 each Topical Q0600  . heparin      . influenza vac split quadrivalent PF  0.5 mL Intramuscular Tomorrow-1000  . montelukast  10 mg Oral Daily  . nortriptyline  25 mg Oral QHS  . pantoprazole  40 mg Oral BID AC  . sertraline  100 mg Oral BID  . sevelamer carbonate  1,600 mg Oral TID WC  . topiramate  50 mg Oral Daily    Assessment: 43 yr old female on Coumadin PTA for hx DVT 05/24/19.  INR 8.1 on admit and Coumadin held. Vitamin K 5 mg PO given on 9/7.  IV heparin begun on 9/8 pm when INR down to 1.7. s/p TDC placement on 9/11. INR today is 2.0. Heparin has been running 2900 units/hr since 9/11 and ne level has been therapeutic (9/11) and multiple non-therapeutic since then. The infusion site was changed on 9/13 -heparin  level < 0.1, INR this am was 2.0   Goal of Therapy:  Heparin level 0.3-0.7 units/ml Monitor platelets by anticoagulation protocol: Yes    Plan:  -No heparin adjustments with INR at 2.0 -Daily heparin level and CBC   Hildred Laser, PharmD Clinical Pharmacist **Pharmacist phone directory can now be found on [email or web address] ([email or web address]).  Listed under [place].

## 2019-07-27 NOTE — Plan of Care (Signed)

## 2019-07-28 DIAGNOSIS — I96 Gangrene, not elsewhere classified: Principal | ICD-10-CM

## 2019-07-28 DIAGNOSIS — T148XXA Other injury of unspecified body region, initial encounter: Secondary | ICD-10-CM

## 2019-07-28 LAB — CBC WITH DIFFERENTIAL/PLATELET
Abs Immature Granulocytes: 0.3 10*3/uL — ABNORMAL HIGH (ref 0.00–0.07)
Basophils Absolute: 0 10*3/uL (ref 0.0–0.1)
Basophils Relative: 0 %
Eosinophils Absolute: 0.6 10*3/uL — ABNORMAL HIGH (ref 0.0–0.5)
Eosinophils Relative: 4 %
HCT: 24.3 % — ABNORMAL LOW (ref 36.0–46.0)
Hemoglobin: 7.2 g/dL — ABNORMAL LOW (ref 12.0–15.0)
Lymphocytes Relative: 7 %
Lymphs Abs: 1.1 10*3/uL (ref 0.7–4.0)
MCH: 26.3 pg (ref 26.0–34.0)
MCHC: 29.6 g/dL — ABNORMAL LOW (ref 30.0–36.0)
MCV: 88.7 fL (ref 80.0–100.0)
Monocytes Absolute: 0.5 10*3/uL (ref 0.1–1.0)
Monocytes Relative: 3 %
Myelocytes: 2 %
Neutro Abs: 12.8 10*3/uL — ABNORMAL HIGH (ref 1.7–7.7)
Neutrophils Relative %: 84 %
Platelets: 444 10*3/uL — ABNORMAL HIGH (ref 150–400)
RBC: 2.74 MIL/uL — ABNORMAL LOW (ref 3.87–5.11)
RDW: 21.9 % — ABNORMAL HIGH (ref 11.5–15.5)
WBC: 15.2 10*3/uL — ABNORMAL HIGH (ref 4.0–10.5)
nRBC: 0.2 % (ref 0.0–0.2)
nRBC: 1 /100 WBC — ABNORMAL HIGH

## 2019-07-28 LAB — RENAL FUNCTION PANEL
Albumin: 2 g/dL — ABNORMAL LOW (ref 3.5–5.0)
Anion gap: 15 (ref 5–15)
BUN: 34 mg/dL — ABNORMAL HIGH (ref 6–20)
CO2: 19 mmol/L — ABNORMAL LOW (ref 22–32)
Calcium: 8.5 mg/dL — ABNORMAL LOW (ref 8.9–10.3)
Chloride: 101 mmol/L (ref 98–111)
Creatinine, Ser: 5.75 mg/dL — ABNORMAL HIGH (ref 0.44–1.00)
GFR calc Af Amer: 10 mL/min — ABNORMAL LOW (ref >60)
GFR calc non Af Amer: 8 mL/min — ABNORMAL LOW (ref >60)
Glucose, Bld: 93 mg/dL (ref 70–99)
Phosphorus: 5.5 mg/dL — ABNORMAL HIGH (ref 2.5–4.6)
Potassium: 4 mmol/L (ref 3.5–5.1)
Sodium: 135 mmol/L (ref 135–145)

## 2019-07-28 LAB — HEPARIN LEVEL (UNFRACTIONATED)
Heparin Unfractionated: <0.1 IU/mL — ABNORMAL LOW (ref 0.30–0.70)
Heparin Unfractionated: <0.1 IU/mL — ABNORMAL LOW (ref 0.30–0.70)

## 2019-07-28 LAB — HEMOGLOBIN AND HEMATOCRIT, BLOOD
HCT: 24.8 % — ABNORMAL LOW (ref 36.0–46.0)
Hemoglobin: 7.5 g/dL — ABNORMAL LOW (ref 12.0–15.0)

## 2019-07-28 LAB — HEPATITIS B CORE ANTIBODY, IGM: Hep B C IgM: NEGATIVE

## 2019-07-28 LAB — HEPATITIS B SURFACE ANTIBODY,QUALITATIVE: Hep B S Ab: NONREACTIVE

## 2019-07-28 LAB — PROTIME-INR
INR: 1.9 — ABNORMAL HIGH (ref 0.8–1.2)
Prothrombin Time: 21.1 seconds — ABNORMAL HIGH (ref 11.4–15.2)

## 2019-07-28 NOTE — Progress Notes (Signed)
ANTICOAGULATION CONSULT NOTE - Follow Up Consult  Pharmacy Consult for Heparin (Warfarin on hold) Indication: Recent DVT (05/24/19)  Allergies  Allergen Reactions  . Aspirin     Due to hx of stomach ulcers    Patient Measurements: Height: 5\' 5"  (165.1 cm) Weight: (scale inaccurate) IBW/kg (Calculated) : 57 Heparin Dosing Weight: 125.2  Vital Signs: Temp: 98.3 F (36.8 C) (09/14 0317) Temp Source: Oral (09/14 0317) BP: 135/106 (09/14 0317) Pulse Rate: 106 (09/14 0317)  Labs: Recent Labs    07/25/19 0930 07/26/19 1031 07/26/19 1054 07/26/19 2142 07/27/19 0342 07/27/19 2005 07/28/19 0402  HGB 7.2* 6.6*  --  7.6* 7.7* 7.6*  --   HCT 24.6* 22.5*  --  25.4* 26.3* 25.1*  --   PLT 467* 407*  --   --  486*  --   --   LABPROT  --  23.6*  --   --  22.2*  --  21.1*  INR  --  2.1*  --   --  2.0*  --  1.9*  HEPARINUNFRC 0.54 <0.10*  --  <0.10* <0.10* <0.10* <0.10*  CREATININE  --   --  6.18*  --  7.15*  --  5.75*    Estimated Creatinine Clearance: 29 mL/min (A) (by C-G formula based on SCr of 5.75 mg/dL (H)).   Medications:  Scheduled:  . Chlorhexidine Gluconate Cloth  6 each Topical Q0600  . influenza vac split quadrivalent PF  0.5 mL Intramuscular Tomorrow-1000  . montelukast  10 mg Oral Daily  . nortriptyline  25 mg Oral QHS  . pantoprazole  40 mg Oral BID AC  . sertraline  100 mg Oral BID  . sevelamer carbonate  1,600 mg Oral TID WC  . topiramate  50 mg Oral Daily    Assessment: 43 yr old female on Coumadin PTA for hx DVT 05/24/19.  INR 8.1 on admit and Coumadin held. Vitamin K 5 mg PO given on 9/7.  IV heparin begun on 9/8 pm when INR down to 1.7. s/p TDC placement on 9/11. INR today is 2.0. Heparin has been running 2900 units/hr since 9/11 and heparin level had been therapeutic (9/11) but multiple non-therapeutic since then. The infusion site was changed on 9/13 -heparin level < 0.1, INR this am was 1.9  Spoke to RN, no issues with heparin infusion or line.  Heparin level still undetectable with new IV site. INR down to 1.9, Will increase heparin rate, but not bolus due to INR 1.9   Goal of Therapy:  Heparin level 0.3-0.7 units/ml Monitor platelets by anticoagulation protocol: Yes    Plan:  -Heparin infusion to 3300 units/hr -No bolus -Heparin level in 6 hours -Daily heparin level and CBC   Megan Ortiz A. Levada Dy, PharmD, BCPS, FNKF Clinical Pharmacist [place] Please utilize Amion for appropriate phone number to reach the unit pharmacist ([place])

## 2019-07-28 NOTE — Progress Notes (Signed)
Pharmacy Antibiotic Note  Megan Ortiz is a 43 y.o. female admitted on 07/19/2019 with cellulitis.  Pharmacy has been consulted for cefazolin dosing. Patient with AKI on HD  Plan: Continue Cefazolin 2gm IV q24h Follow renal function, clinical progress, antibiotic plans.  Height: 5\' 5"  (165.1 cm) Weight: (scale inaccurate) IBW/kg (Calculated) : 57  Temp (24hrs), Avg:98.2 F (36.8 C), Min:97.8 F (36.6 C), Max:98.4 F (36.9 C)  Recent Labs  Lab 07/24/19 0508 07/25/19 0347 07/25/19 0930 07/26/19 1031 07/26/19 1054 07/27/19 0342 07/28/19 0402 07/28/19 0703  WBC 17.2*  --  16.7* 14.1*  --  15.9*  --  15.2*  CREATININE 5.22* 6.21*  --   --  6.18* 7.15* 5.75*  --   VANCORANDOM  --   --  52*  --   --   --   --   --     Estimated Creatinine Clearance: 29 mL/min (A) (by C-G formula based on SCr of 5.75 mg/dL (H)).    Allergies  Allergen Reactions  . Aspirin     Due to hx of stomach ulcers    Antimicrobials this admission: Vancomycin9/5 >>9/8 Zosyn9/5 >>9/8 Ceftriaxone 9/8>>9/10 Cefazolin 9/11>>    Boubacar Lerette A. Levada Dy, PharmD, BCPS, FNKF Clinical Pharmacist [place] Please utilize Amion for appropriate phone number to reach the unit pharmacist ([place])    07/28/2019 7:57 AM

## 2019-07-28 NOTE — Progress Notes (Signed)
PROGRESS NOTE  Megan Ortiz [id number] DOB: [birth year]   PCP: Larene Beach, MD  Patient is from: Home  DOA: 07/19/2019 LOS: 9  Brief Narrative / Interim history: Patient is a 43 year old Caucasian female, morbidly obese, with recent RLE DVT on 05/2019 on Coumadin, history of necrotizing fasciitis with debridement of buttock and gluteal wound in 2018.  Patient presented with RLE pain, induration, ecchymosis and tenderness over the medial aspect.  Not able to obtain CT due to body habitus. Ultrasound imaging did not show any underlying fluid collections.    She was a started on vancomycin and Zosyn.  During the course of this hospitalization, patient has developed severe acute kidney injury.  Tunneled hemodialysis catheter has been placed, and patient has had 1 renal replacement therapy.  Likely, patient will be dialyzed again tomorrow.  Nephrology team's input is appreciated.  Patient is currently on heparin drip.  Coumadin is currently on hold.  On presentation, INR range from 8.1 to 9.9.  INR checked earlier today was 1.9.  Subjective: No new complaints. No fever or chills No chest pain No shortness of breath  Objective: Vitals:   07/27/19 1949 07/28/19 0317 07/28/19 0921 07/28/19 1612  BP: (!) 105/51 (!) 135/106 (!) 117/48 (!) 118/55  Pulse: (!) 103 (!) 106 (!) 103 (!) 109  Resp:   20 17  Temp: 98.4 F (36.9 C) 98.3 F (36.8 C) 97.7 F (36.5 C) 97.9 F (36.6 C)  TempSrc: Oral Oral Oral Oral  SpO2: 90% 95% 91% 92%  Weight:      Height:        Intake/Output Summary (Last 24 hours) at 07/28/2019 1703 Last data filed at 07/28/2019 1500 Gross per 24 hour  Intake 1112.54 ml  Output -  Net 1112.54 ml   Filed Weights   07/25/19 1450 07/25/19 2210 07/26/19 0021  Weight: (!) 251 kg (!) 283.6 kg (!) 282.3 kg    Examination:  GENERAL: No acute distress.  Morbidly obese. HEENT: Pallor.  No jaundice. NECK: Supple.  No apparent JVD.  RESP: Clear to auscultation. CVS:  S1-S2. ABD: Morbidly obese.  Organs are difficult to assess.   MSK/EXT: Right lower extremity is swollen with discoloration as depicted above, likely secondary to elevated INR/Coumadin therapy.  SKIN: As above. NEURO: Awake, alert and oriented appropriately.  Patient moves all extremities.  On 07/25/2019    On 07/24/2019    On 07/19/2019      Assessment & Plan: Skin changes right lower extremity: This likely related to elevated INR and bruising. INR will peaked at 9.9. Doubt leukocytoclastic rash. Other possibilities include warfarin induced skin necrosis Doubt cellulitis  Right lower extremity DVT:  Diagnosed in 05/2019.   Patient was started on Coumadin.   INR was supra elevated on presentation, 8.1 and peaked at 9.9.   Coumadin is on hold.   Patient is currently on heparin drip.   INR today is 1.9.    Acute on chronic anemia:  Possibly due to acute blood loss.   Continue to monitor H/H.   Further management depend on hospital course.   Hemoglobin today 7.7 g/dL.  AKI: Nephrology team is managing.   This is likely multifactorial (will defer to the nephrology team)  -[facility] placed on 07/25/2019 for renal replacement therapy. -Patient has had 1 renal replacement therapy.  For likely repeat hemodialysis tomorrow.  Essential hypertension:  Blood pressure has been on the low side, with episodes of hypotension.   Hopefully, patient is not bleeding internally.  Continue to monitor H&H closely and heart rate.    GERD:  -On PPI.  This could potentially contribute to her AKI.  Mood disorder:  Stable. -Continue home medication  Morbid obesity: BMI 104 -Needs lifestyle change to lose weight. -May benefit from bariatric surgery outpatient.  DVT prophylaxis: On heparin bridge for DVT treatment Code Status: Full code Family Communication: Husband.   Disposition Plan: Remains inpatient for AKI Consultants: Nephrology, vascular surgery  Procedures:  9/11-TDC   Microbiology summarized: SARS-CoV-2 negative. Blood cultures negative.  Antimicrobials: Anti-infectives (From admission, onward)   Start     Dose/Rate Route Frequency Ordered Stop   07/25/19 2200  ceFAZolin (ANCEF) IVPB 2g/100 mL premix  Status:  Discontinued     2 g 200 mL/hr over 30 Minutes Intravenous Every 24 hours 07/25/19 1311 07/25/19 1333   07/25/19 2200  ceFAZolin (ANCEF) IVPB 2g/100 mL premix     2 g 200 mL/hr over 30 Minutes Intravenous Every 24 hours 07/25/19 1334     07/25/19 1345  ceFAZolin (ANCEF) IVPB 2g/100 mL premix  Status:  Discontinued     2 g 200 mL/hr over 30 Minutes Intravenous Every 12 hours 07/25/19 1333 07/25/19 1334   07/25/19 1100  ceFAZolin (ANCEF) IVPB 2g/100 mL premix  Status:  Discontinued     2 g 200 mL/hr over 30 Minutes Intravenous Every 12 hours 07/25/19 1023 07/25/19 1311   07/22/19 1730  cefTRIAXone (ROCEPHIN) 2 g in sodium chloride 0.9 % 100 mL IVPB  Status:  Discontinued     2 g 200 mL/hr over 30 Minutes Intravenous Every 24 hours 07/22/19 1704 07/24/19 1314   07/20/19 0615  vancomycin (VANCOCIN) 1,500 mg in sodium chloride 0.9 % 500 mL IVPB  Status:  Discontinued     1,500 mg 250 mL/hr over 120 Minutes Intravenous Every 8 hours 07/20/19 0610 07/22/19 0825   07/19/19 2200  Vancomycin (VANCOCIN) 1,500 mg in sodium chloride 0.9 % 500 mL IVPB  Status:  Discontinued     1,500 mg 250 mL/hr over 120 Minutes Intravenous Every 8 hours 07/19/19 1846 07/20/19 0606   07/19/19 2130  piperacillin-tazobactam (ZOSYN) IVPB 3.375 g  Status:  Discontinued     3.375 g 100 mL/hr over 30 Minutes Intravenous  Once 07/19/19 2122 07/19/19 2127   07/19/19 2130  vancomycin (VANCOCIN) IVPB 1000 mg/200 mL premix  Status:  Discontinued     1,000 mg 200 mL/hr over 60 Minutes Intravenous  Once 07/19/19 2122 07/19/19 2127   07/19/19 2130  piperacillin-tazobactam (ZOSYN) IVPB 3.375 g  Status:  Discontinued     3.375 g 12.5 mL/hr over 240 Minutes Intravenous Every 8 hours  07/19/19 1846 07/22/19 1704   07/19/19 1230  vancomycin (VANCOCIN) IVPB 1000 mg/200 mL premix     1,000 mg 200 mL/hr over 60 Minutes Intravenous Every 1 hr x 2 07/19/19 1215 07/19/19 1523   07/19/19 1230  piperacillin-tazobactam (ZOSYN) IVPB 3.375 g     3.375 g 12.5 mL/hr over 240 Minutes Intravenous  Once 07/19/19 1215 07/19/19 1323      Sch Meds:  Scheduled Meds: . Chlorhexidine Gluconate Cloth  6 each Topical Q0600  . influenza vac split quadrivalent PF  0.5 mL Intramuscular Tomorrow-1000  . montelukast  10 mg Oral Daily  . nortriptyline  25 mg Oral QHS  . pantoprazole  40 mg Oral BID AC  . sertraline  100 mg Oral BID  . sevelamer carbonate  1,600 mg Oral TID WC  . topiramate  50 mg  Oral Daily   Continuous Infusions: .  ceFAZolin (ANCEF) IV 2 g (07/27/19 2202)  . heparin 3,600 Units/hr (07/28/19 1702)   PRN Meds:.acetaminophen **OR** acetaminophen, albuterol, ondansetron **OR** ondansetron (ZOFRAN) IV, oxyCODONE, sodium chloride flush   I have personally reviewed the following labs and images: CBC: Recent Labs  Lab 07/24/19 0508 07/25/19 0930 07/26/19 1031 07/26/19 2142 07/27/19 0342 07/27/19 2005 07/28/19 0703 07/28/19 1050  WBC 17.2* 16.7* 14.1*  --  15.9*  --  15.2*  --   NEUTROABS  --   --   --   --   --   --  12.8*  --   HGB 7.6* 7.2* 6.6* 7.6* 7.7* 7.6* 7.2* 7.5*  HCT 25.4* 24.6* 22.5* 25.4* 26.3* 25.1* 24.3* 24.8*  MCV 88.2 90.1 89.3  --  89.8  --  88.7  --   PLT 458* 467* 407*  --  486*  --  444*  --    BMP &GFR Recent Labs  Lab 07/24/19 0508 07/25/19 0347 07/26/19 1054 07/27/19 0342 07/28/19 0402  NA 133* 134* 136 136 135  K 4.7 5.0 4.4 4.7 4.0  CL 102 105 104 104 101  CO2 17* 16* 19* 17* 19*  GLUCOSE 98 93 91 96 93  BUN 39* 45* 40* 45* 34*  CREATININE 5.22* 6.21* 6.18* 7.15* 5.75*  CALCIUM 8.3* 8.4* 8.0* 8.3* 8.5*  MG  --   --   --  1.9  --   PHOS  --  7.1* 6.5* 7.2* 5.5*   Estimated Creatinine Clearance: 29 mL/min (A) (by C-G formula  based on SCr of 5.75 mg/dL (H)). Liver & Pancreas: Recent Labs  Lab 07/25/19 0347 07/26/19 1054 07/27/19 0342 07/28/19 0402  ALBUMIN 2.2* 2.1* 2.2* 2.0*   No results for input(s): LIPASE, AMYLASE in the last 168 hours. No results for input(s): AMMONIA in the last 168 hours. Diabetic: No results for input(s): HGBA1C in the last 72 hours. No results for input(s): GLUCAP in the last 168 hours. Cardiac Enzymes: No results for input(s): CKTOTAL, CKMB, CKMBINDEX, TROPONINI in the last 168 hours. No results for input(s): PROBNP in the last 8760 hours. Coagulation Profile: Recent Labs  Lab 07/24/19 0508 07/25/19 0347 07/26/19 1031 07/27/19 0342 07/28/19 0402  INR 1.6* 1.8* 2.1* 2.0* 1.9*   Thyroid Function Tests: No results for input(s): TSH, T4TOTAL, FREET4, T3FREE, THYROIDAB in the last 72 hours. Lipid Profile: No results for input(s): CHOL, HDL, LDLCALC, TRIG, CHOLHDL, LDLDIRECT in the last 72 hours. Anemia Panel: No results for input(s): VITAMINB12, FOLATE, FERRITIN, TIBC, IRON, RETICCTPCT in the last 72 hours. Urine analysis:    Component Value Date/Time   COLORURINE YELLOW 07/25/2019 0635   APPEARANCEUR TURBID (A) 07/25/2019 0635   LABSPEC 1.019 07/25/2019 0635   PHURINE 5.0 07/25/2019 0635   GLUCOSEU NEGATIVE 07/25/2019 0635   HGBUR MODERATE (A) 07/25/2019 0635   BILIRUBINUR NEGATIVE 07/25/2019 [place] 07/25/2019 0635   PROTEINUR 100 (A) 07/25/2019 0635   UROBILINOGEN 0.2 05/11/2013 1903   NITRITE NEGATIVE 07/25/2019 0635   LEUKOCYTESUR NEGATIVE 07/25/2019 0635   Sepsis Labs: Invalid input(s): PROCALCITONIN, [place]  Microbiology: Recent Results (from the past 240 hour(s))  Blood culture (routine x 2)     Status: None   Collection Time: 07/19/19 11:52 AM   Specimen: Right Antecubital; Blood  Result Value Ref Range Status   Specimen Description   Final    RIGHT ANTECUBITAL BOTTLES DRAWN AEROBIC AND ANAEROBIC   Special Requests Blood  Culture  adequate volume  Final   Culture   Final    NO GROWTH 5 DAYS Performed at [facility], [address], [place], [place] [address]    Report Status 07/24/2019 FINAL  Final  Blood culture (routine x 2)     Status: None   Collection Time: 07/19/19 12:27 PM   Specimen: Left Antecubital; Blood  Result Value Ref Range Status   Specimen Description   Final    LEFT ANTECUBITAL BOTTLES DRAWN AEROBIC AND ANAEROBIC   Special Requests Blood Culture adequate volume  Final   Culture   Final    NO GROWTH 5 DAYS Performed at [facility], [address] [place], [place] [address]    Report Status 07/24/2019 FINAL  Final  SARS Coronavirus 2 [facility] order, Performed in [facility] hospital lab) Nasopharyngeal Nasopharyngeal Swab     Status: None   Collection Time: 07/19/19 12:50 PM   Specimen: Nasopharyngeal Swab  Result Value Ref Range Status   SARS Coronavirus 2 NEGATIVE NEGATIVE Final    Comment: (NOTE) If result is NEGATIVE SARS-CoV-2 target nucleic acids are NOT DETECTED. The SARS-CoV-2 RNA is generally detectable in upper and lower  respiratory specimens during the acute phase of infection. The lowest  concentration of SARS-CoV-2 viral copies this assay can detect is 250  copies / mL. A negative result does not preclude SARS-CoV-2 infection  and should not be used as the sole basis for treatment or other  patient management decisions.  A negative result may occur with  improper specimen collection / handling, submission of specimen other  than nasopharyngeal swab, presence of viral mutation(s) within the  areas targeted by this assay, and inadequate number of viral copies  (<250 copies / mL). A negative result must be combined with clinical  observations, patient history, and epidemiological information. If result is POSITIVE SARS-CoV-2 target nucleic acids are DETECTED. The SARS-CoV-2 RNA is generally detectable in upper and lower  respiratory specimens dur ing the acute phase  of infection.  Positive  results are indicative of active infection with SARS-CoV-2.  Clinical  correlation with patient history and other diagnostic information is  necessary to determine patient infection status.  Positive results do  not rule out bacterial infection or co-infection with other viruses. If result is PRESUMPTIVE POSTIVE SARS-CoV-2 nucleic acids MAY BE PRESENT.   A presumptive positive result was obtained on the submitted specimen  and confirmed on repeat testing.  While 2019 novel coronavirus  (SARS-CoV-2) nucleic acids may be present in the submitted sample  additional confirmatory testing may be necessary for epidemiological  and / or clinical management purposes  to differentiate between  SARS-CoV-2 and other Sarbecovirus currently known to infect humans.  If clinically indicated additional testing with an alternate test  methodology [phone]) is advised. The SARS-CoV-2 RNA is generally  detectable in upper and lower respiratory sp ecimens during the acute  phase of infection. The expected result is Negative. Fact Sheet for Patients:  StrictlyIdeas.no Fact Sheet for Healthcare Providers: BankingDealers.co.za This test is not yet approved or cleared by the [place] FDA and has been authorized for detection and/or diagnosis of SARS-CoV-2 by FDA under an Emergency Use Authorization (EUA).  This EUA will remain in effect (meaning this test can be used) for the duration of the COVID-19 declaration under Section 564(b)(1) of the Act, 21 U.S.C. section 360bbb-3(b)(1), unless the authorization is terminated or revoked sooner. Performed at [facility], [address], [place], [place] [address]   Surgical  pcr screen     Status: None   Collection Time: 07/25/19  2:10 PM   Specimen: Nasal Mucosa; Nasal Swab  Result Value Ref Range Status   MRSA, PCR NEGATIVE NEGATIVE Final   Staphylococcus aureus NEGATIVE NEGATIVE Final     Comment: (NOTE) The Xpert SA Assay (FDA approved for NASAL specimens in patients 14 years of age and older), is one component of a comprehensive surveillance program. It is not intended to diagnose infection nor to guide or monitor treatment. Performed at [facility], [place] [address] [place], [place] [address]     Radiology Studies: No results found.  Bonnell Public, M.D. [facility]  If 7PM-7AM, please contact night-coverage [email or web address] Password [facility] 07/28/2019, 5:03 PM

## 2019-07-28 NOTE — Progress Notes (Signed)
[facility]    NEPHROLOGY PROGRESS NOTE  SUBJECTIVE: Denies any complaints today.  Reports no urine output.  Denies any cp/sob.  Denies n/v.  All other ROS negative.   OBJECTIVE:  Vitals:   07/28/19 0317 07/28/19 0921  BP: (!) 135/106 (!) 117/48  Pulse: (!) 106 (!) 103  Resp:  20  Temp: 98.3 F (36.8 C) 97.7 F (36.5 C)  SpO2: 95% 91%    Intake/Output Summary (Last 24 hours) at 07/28/2019 1537 Last data filed at 07/28/2019 1159 Gross per 24 hour  Intake 1232.54 ml  Output -  Net 1232.54 ml      General:  AAOx3 NAD HEENT: MMM [place] AT anicteric sclera Neck:  No JVD, no adenopathy CV:  Heart RRR, right IJ tunneled dialysis catheter Lungs:  L/S CTA bilaterally Abd:  abd SNT/ND with normal BS, morbidly obese GU:  Bladder non-palpable Extremities:  (+) necrotic lesions right leg, (+)2 edema Skin:  No skin rash  MEDICATIONS:  . Chlorhexidine Gluconate Cloth  6 each Topical Q0600  . influenza vac split quadrivalent PF  0.5 mL Intramuscular Tomorrow-1000  . montelukast  10 mg Oral Daily  . nortriptyline  25 mg Oral QHS  . pantoprazole  40 mg Oral BID AC  . sertraline  100 mg Oral BID  . sevelamer carbonate  1,600 mg Oral TID WC  . topiramate  50 mg Oral Daily       LABS:   CBC Latest Ref Rng & Units 07/28/2019 07/28/2019 07/27/2019  WBC 4.0 - 10.5 K/uL - 15.2(H) -  Hemoglobin 12.0 - 15.0 g/dL 7.5(L) 7.2(L) 7.6(L)  Hematocrit 36.0 - 46.0 % 24.8(L) 24.3(L) 25.1(L)  Platelets 150 - 400 K/uL - 444(H) -    CMP Latest Ref Rng & Units 07/28/2019 07/27/2019 07/26/2019  Glucose 70 - 99 mg/dL 93 96 91  BUN 6 - 20 mg/dL 34(H) 45(H) 40(H)  Creatinine 0.44 - 1.00 mg/dL 5.75(H) 7.15(H) 6.18(H)  Sodium 135 - 145 mmol/L 135 136 136  Potassium 3.5 - 5.1 mmol/L 4.0 4.7 4.4  Chloride 98 - 111 mmol/L 101 104 104  CO2 22 - 32 mmol/L 19(L) 17(L) 19(L)  Calcium 8.9 - 10.3 mg/dL 8.5(L) 8.3(L) 8.0(L)  Total Protein 6.5 - 8.1 g/dL - - -  Total Bilirubin 0.3 - 1.2 mg/dL - - -   Alkaline Phos 38 - 126 U/L - - -  AST 15 - 41 U/L - - -  ALT 0 - 44 U/L - - -    Lab Results  Component Value Date   CALCIUM 8.5 (L) 07/28/2019   PHOS 5.5 (H) 07/28/2019       Component Value Date/Time   COLORURINE YELLOW 07/25/2019 0635   APPEARANCEUR TURBID (A) 07/25/2019 0635   LABSPEC 1.019 07/25/2019 0635   PHURINE 5.0 07/25/2019 0635   GLUCOSEU NEGATIVE 07/25/2019 0635   HGBUR MODERATE (A) 07/25/2019 0635   BILIRUBINUR NEGATIVE 07/25/2019 0635   KETONESUR NEGATIVE 07/25/2019 0635   PROTEINUR 100 (A) 07/25/2019 0635   UROBILINOGEN 0.2 05/11/2013 1903   NITRITE NEGATIVE 07/25/2019 0635   LEUKOCYTESUR NEGATIVE 07/25/2019 0635      Component Value Date/Time   PHART 7.371 05/30/2019 1655   PCO2ART 33.4 05/30/2019 1655   PO2ART 79.4 (L) 05/30/2019 1655   HCO3 18.9 (L) 05/30/2019 1655   TCO2 21 02/19/2017 0856   ACIDBASEDEF 5.4 (H) 05/30/2019 1655   O2SAT 96.2 05/30/2019 1655       Component Value Date/Time   IRON 10 (  L) 07/24/2019 0911   TIBC 211 (L) 07/24/2019 0911   FERRITIN 77 07/24/2019 0911   IRONPCTSAT 5 (L) 07/24/2019 0911       ASSESSMENT/PLAN:    This is a 43 year old lady with history of morbid obesity recent right lower extremity DVT July 2020.  She been taking Coumadin she has a history of necrotizing fasciitis and debridements of the buttock and gluteal wound in 2018.  She presented 07/19/2019 with right lower extremity pain induration ecchymosis and tenderness of the medial aspect of thigh.  Not able to get a CT scan secondary to body habitus.  She appears to have some acute kidney injury and nephrology was consulted.   Acute kidney injury.   [place] without obstruction.  Likely due to ATN / vanco.  Avoid nephrotoxins ACE inhibitor's ARB use nonsteroidal anti-inflammatory drugs.  Avoid IV contrast.  Avoid intra-arterial procedures.  Worsening renal function in the setting of anuria -started HD on Friday, 07/25/2019.  We will plan for dialysis tomorrow given lack  of urine output.  Check bladder scan.  Calciphylaxis/Coumadin necrosis.  Aggressive wound care management.  Will increase sevelamer to manage phosphorus.  Morbid obesity  RLE DVT.  Continue anticoag  Hyperphosphatemia.  Continue sevelamer.  Anemia.  Status post PRBCs.   [place], DO, [place]

## 2019-07-28 NOTE — Progress Notes (Signed)
ANTICOAGULATION CONSULT NOTE - Follow Up Consult  Pharmacy Consult for Heparin (Warfarin on hold) Indication: Recent DVT (05/24/19)  Allergies  Allergen Reactions  . Aspirin     Due to hx of stomach ulcers    Patient Measurements: Height: 5\' 5"  (165.1 cm) Weight: (scale inaccurate) IBW/kg (Calculated) : 57 Heparin Dosing Weight: 125.2  Vital Signs: Temp: 97.9 F (36.6 C) (09/14 1612) Temp Source: Oral (09/14 1612) BP: 118/55 (09/14 1612) Pulse Rate: 109 (09/14 1612)  Labs: Recent Labs    07/26/19 1031 07/26/19 1054  07/27/19 0342 07/27/19 2005 07/28/19 0402 07/28/19 0703 07/28/19 1050 07/28/19 1408  HGB 6.6*  --    < > 7.7* 7.6*  --  7.2* 7.5*  --   HCT 22.5*  --    < > 26.3* 25.1*  --  24.3* 24.8*  --   PLT 407*  --   --  486*  --   --  444*  --   --   LABPROT 23.6*  --   --  22.2*  --  21.1*  --   --   --   INR 2.1*  --   --  2.0*  --  1.9*  --   --   --   HEPARINUNFRC <0.10*  --    < > <0.10* <0.10* <0.10*  --   --  <0.10*  CREATININE  --  6.18*  --  7.15*  --  5.75*  --   --   --    < > = values in this interval not displayed.    Estimated Creatinine Clearance: 29 mL/min (A) (by C-G formula based on SCr of 5.75 mg/dL (H)).   Medications:  Scheduled:  . Chlorhexidine Gluconate Cloth  6 each Topical Q0600  . influenza vac split quadrivalent PF  0.5 mL Intramuscular Tomorrow-1000  . montelukast  10 mg Oral Daily  . nortriptyline  25 mg Oral QHS  . pantoprazole  40 mg Oral BID AC  . sertraline  100 mg Oral BID  . sevelamer carbonate  1,600 mg Oral TID WC  . topiramate  50 mg Oral Daily    Assessment: 43 yr old female on Coumadin PTA for hx DVT 05/24/19.  INR 8.1 on admit and Coumadin held. Vitamin K 5 mg PO given on 9/7.  IV heparin begun on 9/8 pm when INR down to 1.7. s/p TDC placement on 9/11. INR today is 2.0. Heparin has been running 2900 units/hr since 9/11 and heparin level had been therapeutic (9/11) but multiple non-therapeutic since then. The  infusion site was changed on 9/13 -heparin level < 0.1, INR this am was 1.9  Heparin level still undetectable this PM   Goal of Therapy:  Heparin level 0.3-0.7 units/ml Monitor platelets by anticoagulation protocol: Yes    Plan:  -Heparin infusion to 3600 units/hr -No bolus -Heparin level in 6 hours -Daily heparin level and CBC  Thank you Anette Guarneri, PharmD [phone] Please utilize Amion for appropriate phone number to reach the unit pharmacist ([place])

## 2019-07-29 LAB — HEPARIN LEVEL (UNFRACTIONATED)
Heparin Unfractionated: 0.16 IU/mL — ABNORMAL LOW (ref 0.30–0.70)
Heparin Unfractionated: 0.25 IU/mL — ABNORMAL LOW (ref 0.30–0.70)
Heparin Unfractionated: <0.1 IU/mL — ABNORMAL LOW (ref 0.30–0.70)

## 2019-07-29 LAB — CBC WITH DIFFERENTIAL/PLATELET
Abs Immature Granulocytes: 0 10*3/uL (ref 0.00–0.07)
Basophils Absolute: 0 10*3/uL (ref 0.0–0.1)
Basophils Relative: 0 %
Eosinophils Absolute: 0.5 10*3/uL (ref 0.0–0.5)
Eosinophils Relative: 3 %
HCT: 24.3 % — ABNORMAL LOW (ref 36.0–46.0)
Hemoglobin: 7.4 g/dL — ABNORMAL LOW (ref 12.0–15.0)
Lymphocytes Relative: 7 %
Lymphs Abs: 1.1 10*3/uL (ref 0.7–4.0)
MCH: 27 pg (ref 26.0–34.0)
MCHC: 30.5 g/dL (ref 30.0–36.0)
MCV: 88.7 fL (ref 80.0–100.0)
Monocytes Absolute: 0.6 10*3/uL (ref 0.1–1.0)
Monocytes Relative: 4 %
Neutro Abs: 13.2 10*3/uL — ABNORMAL HIGH (ref 1.7–7.7)
Neutrophils Relative %: 86 %
Platelets: 433 10*3/uL — ABNORMAL HIGH (ref 150–400)
RBC: 2.74 MIL/uL — ABNORMAL LOW (ref 3.87–5.11)
RDW: 22.1 % — ABNORMAL HIGH (ref 11.5–15.5)
WBC: 15.3 10*3/uL — ABNORMAL HIGH (ref 4.0–10.5)
nRBC: 0 /100 WBC
nRBC: 0.1 % (ref 0.0–0.2)

## 2019-07-29 LAB — CBC
HCT: 24.5 % — ABNORMAL LOW (ref 36.0–46.0)
Hemoglobin: 7.2 g/dL — ABNORMAL LOW (ref 12.0–15.0)
MCH: 26.5 pg (ref 26.0–34.0)
MCHC: 29.4 g/dL — ABNORMAL LOW (ref 30.0–36.0)
MCV: 90.1 fL (ref 80.0–100.0)
Platelets: 451 10*3/uL — ABNORMAL HIGH (ref 150–400)
RBC: 2.72 MIL/uL — ABNORMAL LOW (ref 3.87–5.11)
RDW: 22.1 % — ABNORMAL HIGH (ref 11.5–15.5)
WBC: 16.2 10*3/uL — ABNORMAL HIGH (ref 4.0–10.5)
nRBC: 0.2 % (ref 0.0–0.2)

## 2019-07-29 LAB — RENAL FUNCTION PANEL
Albumin: 1.9 g/dL — ABNORMAL LOW (ref 3.5–5.0)
Anion gap: 16 — ABNORMAL HIGH (ref 5–15)
BUN: 43 mg/dL — ABNORMAL HIGH (ref 6–20)
CO2: 19 mmol/L — ABNORMAL LOW (ref 22–32)
Calcium: 8.9 mg/dL (ref 8.9–10.3)
Chloride: 98 mmol/L (ref 98–111)
Creatinine, Ser: 6.55 mg/dL — ABNORMAL HIGH (ref 0.44–1.00)
GFR calc Af Amer: 8 mL/min — ABNORMAL LOW (ref >60)
GFR calc non Af Amer: 7 mL/min — ABNORMAL LOW (ref >60)
Glucose, Bld: 90 mg/dL (ref 70–99)
Phosphorus: 5.5 mg/dL — ABNORMAL HIGH (ref 2.5–4.6)
Potassium: 4.1 mmol/L (ref 3.5–5.1)
Sodium: 133 mmol/L — ABNORMAL LOW (ref 135–145)

## 2019-07-29 LAB — APTT
aPTT: 136 seconds — ABNORMAL HIGH (ref 24–36)
aPTT: 150 seconds — ABNORMAL HIGH (ref 24–36)
aPTT: 136 seconds — ABNORMAL HIGH (ref 24–36)

## 2019-07-29 LAB — ANTINUCLEAR ANTIBODIES, IFA: ANA Ab, IFA: NEGATIVE

## 2019-07-29 LAB — PROTIME-INR
INR: 1.8 — ABNORMAL HIGH (ref 0.8–1.2)
Prothrombin Time: 20.6 seconds — ABNORMAL HIGH (ref 11.4–15.2)

## 2019-07-29 LAB — CK: Total CK: <5 U/L — ABNORMAL LOW (ref 38–234)

## 2019-07-29 LAB — ANTITHROMBIN III: AntiThromb III Func: 66 % — ABNORMAL LOW (ref 75–120)

## 2019-07-29 MED ORDER — ALTEPLASE 2 MG IJ SOLR: 2.0000 mg | Freq: Once | INTRAMUSCULAR | Status: DC | PRN

## 2019-07-29 MED ORDER — ARGATROBAN 50 MG/50ML IV SOLN
0.0800 ug/kg/min | INTRAVENOUS | Status: DC
  Administered 2019-07-30: 0.25 ug/kg/min via INTRAVENOUS
  Administered 2019-07-31 – 2019-08-03 (×3): 0.1 ug/kg/min via INTRAVENOUS
  Filled 2019-07-29 (×5): qty 50

## 2019-07-29 MED ORDER — HEPARIN SODIUM (PORCINE) 1000 UNIT/ML DIALYSIS
1000.0000 [IU] | INTRAMUSCULAR | Status: DC | PRN
  Administered 2019-07-29: 17:00:00 3000 [IU] via INTRAVENOUS_CENTRAL
  Filled 2019-07-29: qty 1

## 2019-07-29 MED ORDER — SODIUM CHLORIDE 0.9 % IV SOLN: 100.0000 mL | INTRAVENOUS | Status: DC | PRN

## 2019-07-29 MED ORDER — ARGATROBAN 50 MG/50ML IV SOLN
1.0000 ug/kg/min | INTRAVENOUS | Status: DC
  Administered 2019-07-29 (×2): 1 ug/kg/min via INTRAVENOUS
  Filled 2019-07-29 (×3): qty 50

## 2019-07-29 MED ORDER — HEPARIN SODIUM (PORCINE) 1000 UNIT/ML IJ SOLN
INTRAMUSCULAR | Status: AC
  Administered 2019-07-29: 3000 [IU] via INTRAVENOUS_CENTRAL
  Filled 2019-07-29: qty 1

## 2019-07-29 MED ORDER — HEPARIN BOLUS VIA INFUSION
2000.0000 [IU] | Freq: Once | INTRAVENOUS | Status: DC
  Filled 2019-07-29: qty 2000

## 2019-07-29 MED ORDER — ARGATROBAN 50 MG/50ML IV SOLN
0.5000 ug/kg/min | INTRAVENOUS | Status: DC
  Administered 2019-07-29: 0.5 ug/kg/min via INTRAVENOUS
  Filled 2019-07-29 (×2): qty 50

## 2019-07-29 NOTE — Consult Note (Signed)
[facility] Nurse wound consult note Patient receiving care in [facility] [id number].  Consult completed remotely after review of record and images. Reason for Consult: RLE wounds Wound type: etiology unclear Per the note from Dr. Loann Quill from 07/29/19 at 10:08 a.m.:  "Patient is a 43 year old Caucasian female, morbidly obese,asthma, hiatal hernia, gastric ulcer over 10 years ago, history of PE with history of IVC filter and recent RLE DVT on 05/2019 on Coumadin, history of necrotizing fasciitis with debridement of buttock and gluteal wound in 2018.  Patient presented with RLE pain, induration, ecchymosis and tenderness over the medial aspect.  Not able to obtain CT due to body habitus.Ultrasound imaging did not show any underlying fluid collections."  Images on record show extensive discoloration and erythema of the right leg as compared to the left leg.  The treatment of the condition exceeds the scope of practice of the [place] nurse.  I have communicated this to Dr. Loann Quill via SecureChat and that surgery should be contacted asap. WOC consult is being completed and our services signed off. Thank you for the consult. [place] nurse will not follow at this time.  Please re-consult the [facility] team if needed.  Val Riles, RN, MSN, CWOCN, CNS-BC, pager [phone]

## 2019-07-29 NOTE — Plan of Care (Signed)

## 2019-07-29 NOTE — Progress Notes (Addendum)
ANTICOAGULATION CONSULT NOTE - Follow Up Consult  Pharmacy Consult for Heparin >>Argatroban Indication: Recent DVT (05/24/19)  Allergies  Allergen Reactions  . Aspirin     Due to hx of stomach ulcers    Patient Measurements: Height: 5\' 5"  (165.1 cm) Weight: (scale inaccurate) IBW/kg (Calculated) : 57 Heparin Dosing Weight: 125.2 Actual weight recorded 282.3 kg  Vital Signs: Temp: 97.8 F (36.6 C) (09/15 0738) Temp Source: Oral (09/15 0738) BP: 106/47 (09/15 0738) Pulse Rate: 98 (09/15 0738)  Labs: Recent Labs    07/27/19 0342  07/28/19 0402 07/28/19 0703 07/28/19 1050 07/28/19 1408 07/28/19 2340 07/29/19 0732 07/29/19 1029  HGB 7.7*   < >  --  7.2* 7.5*  --   --  7.2* 7.4*  HCT 26.3*   < >  --  24.3* 24.8*  --   --  24.5* 24.3*  PLT 486*  --   --  444*  --   --   --  451* 433*  LABPROT 22.2*  --  21.1*  --   --   --   --  20.6*  --   INR 2.0*  --  1.9*  --   --   --   --  1.8*  --   HEPARINUNFRC <0.10*   < > <0.10*  --   --  <0.10* 0.16* 0.25*  --   CREATININE 7.15*  --  5.75*  --   --   --   --  6.55*  --   CKTOTAL  --   --   --   --   --   --   --  <5*  --    < > = values in this interval not displayed.    Estimated Creatinine Clearance: 25.5 mL/min (A) (by C-G formula based on SCr of 6.55 mg/dL (H)).   Medications:  Scheduled:  . Chlorhexidine Gluconate Cloth  6 each Topical Q0600  . influenza vac split quadrivalent PF  0.5 mL Intramuscular Tomorrow-1000  . montelukast  10 mg Oral Daily  . nortriptyline  25 mg Oral QHS  . pantoprazole  40 mg Oral BID AC  . sertraline  100 mg Oral BID  . sevelamer carbonate  1,600 mg Oral TID WC  . topiramate  50 mg Oral Daily    Assessment: 43 yr old female on Coumadin PTA for hx DVT 05/24/19.  INR 8.1 on admit and Coumadin held. Vitamin K 5 mg PO given on 9/7.  IV heparin begun on 9/8 pm when INR down to 1.7. s/p TDC placement on 9/11. INR today is 1.8. Heparin has been running at 3950 units/hr and heparin level  still low this AM at 0.25. Heparin rate has reached max rate on smart pumps. Patient may be experiencing heparin resistance due to subtherapeutic heparin levels at very high rate of heparin. Antithombin III level drawn which was below the normal range. D/W Dr. Doristine Bosworth. Will start patient on direct thrombin inhibitor therapy. Due to renal function issues will choose argatroban at a dose of 90mcg/kg/min for non-critically ill patients based on actual BW per protocol.    Goal of Therapy:  APTT 50-90 sec Monitor platelets by anticoagulation protocol: Yes    Plan:  -Start argatroban 1 mcg/kg/min -Stop heparin drip -APTT in 2 hours (patient will be in HD) -Daily APTT and CBC  Teigen Bellin A. Levada Dy, PharmD, BCPS, FNKF Clinical Pharmacist [place] Please utilize Amion for appropriate phone number to reach the unit pharmacist ([place])  Addendum:   Initial APTT was 136 (goal 50-90). Patient currently on HD. Called RN in HD and ordered drip turned off for 30 min and restarted at 50% of the previous rate. Will get APTT 2 hours after restart.   Nasiya Pascual A. Levada Dy, PharmD, BCPS, FNKF Clinical Pharmacist [place] Please utilize Amion for appropriate phone number to reach the unit pharmacist ([place])

## 2019-07-29 NOTE — Progress Notes (Addendum)
ANTICOAGULATION CONSULT NOTE - Follow Up Consult  Pharmacy Consult for Heparin >>Argatroban Indication: Recent DVT (05/24/19)  Allergies  Allergen Reactions  . Aspirin     Due to hx of stomach ulcers    Patient Measurements: Height: 5\' 5"  (165.1 cm) Weight: (!) 623 lb 14.4 oz (283 kg) IBW/kg (Calculated) : 57 Heparin Dosing Weight: 125.2  Vital Signs: Temp: 97.8 F (36.6 C) (09/15 2042) Temp Source: Oral (09/15 2042) BP: 141/71 (09/15 2042) Pulse Rate: 102 (09/15 2042)  Labs: Recent Labs    07/27/19 0342  07/28/19 0402 07/28/19 0703 07/28/19 1050  07/28/19 2340 07/29/19 0732 07/29/19 1029 07/29/19 1400 07/29/19 1635 07/29/19 1916 07/29/19 2200  HGB 7.7*   < >  --  7.2* 7.5*  --   --  7.2* 7.4*  --   --   --   --   HCT 26.3*   < >  --  24.3* 24.8*  --   --  24.5* 24.3*  --   --   --   --   PLT 486*  --   --  444*  --   --   --  451* 433*  --   --   --   --   APTT  --   --   --   --   --   --   --   --   --  136*  --  150* 136*  LABPROT 22.2*  --  21.1*  --   --   --   --  20.6*  --   --   --   --   --   INR 2.0*  --  1.9*  --   --   --   --  1.8*  --   --   --   --   --   HEPARINUNFRC <0.10*   < > <0.10*  --   --    < > 0.16* 0.25*  --   --  <0.10*  --   --   CREATININE 7.15*  --  5.75*  --   --   --   --  6.55*  --   --   --   --   --   CKTOTAL  --   --   --   --   --   --   --  <5*  --   --   --   --   --    < > = values in this interval not displayed.    Estimated Creatinine Clearance: 25.5 mL/min (A) (by C-G formula based on SCr of 6.55 mg/dL (H)).   Assessment: 43 yr old female on Coumadin PTA for hx DVT 05/24/19.  INR 8.1 on admit and Coumadin held. Vitamin K 5 mg PO given on 9/7.  IV heparin begun on 9/8 pm when INR down to 1.7. s/p TDC placement on 9/11. INR today is 1.8. Heparin has been running at 3950 units/hr and heparin level still low this AM at 0.25. Heparin rate has reached max rate on smart pumps. Patient may be experiencing heparin resistance due  to subtherapeutic heparin levels at very high rate of heparin. Antithombin III level drawn which was below the normal range. D/W Dr. Doristine Bosworth. Will start patient on direct thrombin inhibitor therapy. Due to renal function issues will choose argatroban.  PTT 136 sec (supratherapeutic) on argatroban at 0.5 mcg/kg/min. No issues with line or bleeding reported per RN.   Goal  of Therapy:  aPTT 50-90 sec Monitor platelets by anticoagulation protocol: Yes    Plan:  -Hold argatroban for 30 min -Restart at 0.25 mcg/kg/min -APTT 2 hours post gtt restart -Daily APTT and CBC  Sherlon Handing, PharmD, BCPS 07/29/2019 11:30 PM    Addendum ([id number]): PTT remains supratherapeutic (105 sec) - coming down with rate decreases. No issues with line or bleeding reported per RN.  Plan: Decrease agratroban to 0.18 mcg/kg/min (~30% decrease) APTT 2 hours post rate decrease.  Sherlon Handing, PharmD, BCPS 07/30/2019 4:07 AM

## 2019-07-29 NOTE — Progress Notes (Signed)
ANTICOAGULATION CONSULT NOTE - Follow Up Consult  Pharmacy Consult for Heparin  Indication: DVT   Allergies  Allergen Reactions  . Aspirin     Due to hx of stomach ulcers    Patient Measurements: Height: 5\' 5"  (165.1 cm) Weight: (scale inaccurate) IBW/kg (Calculated) : 57 Heparin Dosing Weight: 125.2  Vital Signs: Temp: 98.3 F (36.8 C) (09/14 1930) Temp Source: Oral (09/14 1930) BP: 160/48 (09/14 1930) Pulse Rate: 102 (09/14 1930)  Labs: Recent Labs    07/26/19 1031 07/26/19 1054  07/27/19 0342 07/27/19 2005 07/28/19 0402 07/28/19 0703 07/28/19 1050 07/28/19 1408 07/28/19 2340  HGB 6.6*  --    < > 7.7* 7.6*  --  7.2* 7.5*  --   --   HCT 22.5*  --    < > 26.3* 25.1*  --  24.3* 24.8*  --   --   PLT 407*  --   --  486*  --   --  444*  --   --   --   LABPROT 23.6*  --   --  22.2*  --  21.1*  --   --   --   --   INR 2.1*  --   --  2.0*  --  1.9*  --   --   --   --   HEPARINUNFRC <0.10*  --    < > <0.10* <0.10* <0.10*  --   --  <0.10* 0.16*  CREATININE  --  6.18*  --  7.15*  --  5.75*  --   --   --   --    < > = values in this interval not displayed.    Estimated Creatinine Clearance: 29 mL/min (A) (by C-G formula based on SCr of 5.75 mg/dL (H)).   Assessment: 43 yr old female with hx DVT, Coumadin on hold, for heparin  Goal of Therapy:  Heparin level 0.3-0.7 units/ml Monitor platelets by anticoagulation protocol: Yes    Plan:  Increase Heparin 3950 units/hr Check heparin level in 8 hours.  Phillis Knack, PharmD, BCPS

## 2019-07-29 NOTE — Progress Notes (Addendum)
PROGRESS NOTE  Megan Ortiz [id number] DOB: 1975/12/11   PCP: Larene Beach, MD  Patient is from: Home  DOA: 07/19/2019 LOS: 42  Brief Narrative / Interim history: Patient is a 43 year old Caucasian female, morbidly obese,asthma, hiatal hernia, gastric ulcer over 10 years ago, history of PE with history of IVC filter and recent RLE DVT on 05/2019 on Coumadin, history of necrotizing fasciitis with debridement of buttock and gluteal wound in 2018.  Patient presented with RLE pain, induration, ecchymosis and tenderness over the medial aspect.  Not able to obtain CT due to body habitus. Ultrasound imaging did not show any underlying fluid collections.    She was a started on vancomycin and Zosyn.  During the course of this hospitalization, patient has developed severe acute kidney injury.  Tunneled hemodialysis catheter has been placed, and patient has had 1 renal replacement therapy.  Likely, patient will be dialyzed again tomorrow.  Nephrology team's input is appreciated.  Patient is currently on heparin drip.  Coumadin is currently on hold.  On presentation, INR range from 8.1 to 9.9.   Assessment & Plan: Skin necrosis right lower extremity: Very challenging.  This could be warfarin induced skin necrosis however she was bridged with heparin along with warfarin and heparin was stopped almost 2 months ago but she developed necrosis only 10 days ago which is not a typical timing.  She has now started having some blisters.  Does not have any erythema indicative of cellulitis.  Minimal redness around necrosis.  I personally doubt any infection.  I will consult wound care.  We will follow the recommendations to see if general surgery consultation is required for possible debridement.  Per pharmacy, requiring a lot of heparin and is still not therapeutic.  We will check a T3.  Should ideally also check protein C to make sure she does not have protein C deficiency however she was on Coumadin lately  which may alter the results.  Will wait for few days.  Please refer to following images from 07/29/2019.  Please refer to Dr. Kyra Searles note from 07/28/2019 for previous images from 07/19/2019 for comparison purpose.       Right lower extremity DVT:  Diagnosed in 05/2019.   Patient was started on Coumadin.   INR was supra elevated on presentation, 8.1 and peaked at 9.9.   Coumadin is on hold.   Patient is currently on heparin drip.   INR today is 1.8.  Acute on chronic anemia:  Possibly due to acute blood loss.  Hemoglobin dropped up to 6.6.  Seems to be total 2 unit of PRBC transfusion so far.  Hemoglobin over 7 which is her baseline.  Monitor closely and transfuse if drops less than 7.  AKI: Nephrology team is managing.   This is likely multifactorial (will defer to the nephrology team)  -[facility] placed on 07/25/2019 for renal replacement therapy given..  Further management deferred to nephrology.  Essential hypertension:  Blood pressure has been on the low side, with episodes of hypotension.  Monitor closely.  GERD:  -On PPI.    Mood disorder:  Stable. -Continue home medication  Morbid obesity: BMI 104 -Needs lifestyle change to lose weight. -May benefit from bariatric surgery outpatient.  DVT prophylaxis: On heparin bridge for DVT treatment Code Status: Full code Family Communication: No family present at bedside.  Plan of care discussed with patient in detail. Disposition Plan: TBD Consultants: Nephrology, vascular surgery   Subjective: Patient seen and examined.  She has no complaints.  No right lower extremity pain.  Objective: Vitals:   07/28/19 1612 07/28/19 1930 07/29/19 0402 07/29/19 0738  BP: (!) 118/55 (!) 160/48 (!) 100/43 (!) 106/47  Pulse: (!) 109 (!) 102 99 98  Resp: 17 20 18 17   Temp: 97.9 F (36.6 C) 98.3 F (36.8 C) 98.1 F (36.7 C) 97.8 F (36.6 C)  TempSrc: Oral Oral Oral Oral  SpO2: 92% 95% 93% 98%  Weight:      Height:        Intake/Output  Summary (Last 24 hours) at 07/29/2019 1008 Last data filed at 07/29/2019 0500 Gross per 24 hour  Intake 1440 ml  Output 0 ml  Net 1440 ml   Filed Weights   07/25/19 1450 07/25/19 2210 07/26/19 0021  Weight: (!) 251 kg (!) 283.6 kg (!) 282.3 kg    Examination:  General exam: Appears calm and comfortable, morbidly obese Respiratory system: Clear to auscultation. Respiratory effort normal. Cardiovascular system: S1 & S2 heard, RRR. No JVD, murmurs, rubs, gallops or clicks. No pedal edema. Gastrointestinal system: Abdomen is nondistended, soft and nontender. No organomegaly or masses felt. Normal bowel sounds heard. Central nervous system: Alert and oriented. No focal neurological deficits. Extremities: Symmetric 5 x 5 power. Skin: Necrotic changes on the medial side of the right lower extremity. Psychiatry: Judgement and insight appear normal. Mood & affect appropriate.    On 07/25/2019    On 07/24/2019    On 07/19/2019       Procedures:  9/11-TDC  Microbiology summarized: SARS-CoV-2 negative. Blood cultures negative.  Antimicrobials: Anti-infectives (From admission, onward)   Start     Dose/Rate Route Frequency Ordered Stop   07/25/19 2200  ceFAZolin (ANCEF) IVPB 2g/100 mL premix  Status:  Discontinued     2 g 200 mL/hr over 30 Minutes Intravenous Every 24 hours 07/25/19 1311 07/25/19 1333   07/25/19 2200  ceFAZolin (ANCEF) IVPB 2g/100 mL premix     2 g 200 mL/hr over 30 Minutes Intravenous Every 24 hours 07/25/19 1334     07/25/19 1345  ceFAZolin (ANCEF) IVPB 2g/100 mL premix  Status:  Discontinued     2 g 200 mL/hr over 30 Minutes Intravenous Every 12 hours 07/25/19 1333 07/25/19 1334   07/25/19 1100  ceFAZolin (ANCEF) IVPB 2g/100 mL premix  Status:  Discontinued     2 g 200 mL/hr over 30 Minutes Intravenous Every 12 hours 07/25/19 1023 07/25/19 1311   07/22/19 1730  cefTRIAXone (ROCEPHIN) 2 g in sodium chloride 0.9 % 100 mL IVPB  Status:  Discontinued     2  g 200 mL/hr over 30 Minutes Intravenous Every 24 hours 07/22/19 1704 07/24/19 1314   07/20/19 0615  vancomycin (VANCOCIN) 1,500 mg in sodium chloride 0.9 % 500 mL IVPB  Status:  Discontinued     1,500 mg 250 mL/hr over 120 Minutes Intravenous Every 8 hours 07/20/19 0610 07/22/19 0825   07/19/19 2200  Vancomycin (VANCOCIN) 1,500 mg in sodium chloride 0.9 % 500 mL IVPB  Status:  Discontinued     1,500 mg 250 mL/hr over 120 Minutes Intravenous Every 8 hours 07/19/19 1846 07/20/19 0606   07/19/19 2130  piperacillin-tazobactam (ZOSYN) IVPB 3.375 g  Status:  Discontinued     3.375 g 100 mL/hr over 30 Minutes Intravenous  Once 07/19/19 2122 07/19/19 2127   07/19/19 2130  vancomycin (VANCOCIN) IVPB 1000 mg/200 mL premix  Status:  Discontinued     1,000 mg 200 mL/hr over 60 Minutes Intravenous  Once 07/19/19  2122 07/19/19 2127   07/19/19 2130  piperacillin-tazobactam (ZOSYN) IVPB 3.375 g  Status:  Discontinued     3.375 g 12.5 mL/hr over 240 Minutes Intravenous Every 8 hours 07/19/19 1846 07/22/19 1704   07/19/19 1230  vancomycin (VANCOCIN) IVPB 1000 mg/200 mL premix     1,000 mg 200 mL/hr over 60 Minutes Intravenous Every 1 hr x 2 07/19/19 1215 07/19/19 1523   07/19/19 1230  piperacillin-tazobactam (ZOSYN) IVPB 3.375 g     3.375 g 12.5 mL/hr over 240 Minutes Intravenous  Once 07/19/19 1215 07/19/19 1323      Sch Meds:  Scheduled Meds:  Chlorhexidine Gluconate Cloth  6 each Topical Q0600   heparin  2,000 Units Intravenous Once   influenza vac split quadrivalent PF  0.5 mL Intramuscular Tomorrow-1000   montelukast  10 mg Oral Daily   nortriptyline  25 mg Oral QHS   pantoprazole  40 mg Oral BID AC   sertraline  100 mg Oral BID   sevelamer carbonate  1,600 mg Oral TID WC   topiramate  50 mg Oral Daily   Continuous Infusions:  sodium chloride     sodium chloride      ceFAZolin (ANCEF) IV 2 g (07/28/19 2138)   heparin 4,000 Units/hr (07/29/19 0943)   PRN Meds:.sodium  chloride, sodium chloride, acetaminophen **OR** acetaminophen, albuterol, alteplase, heparin, ondansetron **OR** ondansetron (ZOFRAN) IV, oxyCODONE, sodium chloride flush   I have personally reviewed the following labs and images: CBC: Recent Labs  Lab 07/25/19 0930 07/26/19 1031  07/27/19 0342 07/27/19 2005 07/28/19 0703 07/28/19 1050 07/29/19 0732  WBC 16.7* 14.1*  --  15.9*  --  15.2*  --  16.2*  NEUTROABS  --   --   --   --   --  12.8*  --   --   HGB 7.2* 6.6*   < > 7.7* 7.6* 7.2* 7.5* 7.2*  HCT 24.6* 22.5*   < > 26.3* 25.1* 24.3* 24.8* 24.5*  MCV 90.1 89.3  --  89.8  --  88.7  --  90.1  PLT 467* 407*  --  486*  --  444*  --  451*   < > = values in this interval not displayed.   BMP &GFR Recent Labs  Lab 07/25/19 0347 07/26/19 1054 07/27/19 0342 07/28/19 0402 07/29/19 0732  NA 134* 136 136 135 133*  K 5.0 4.4 4.7 4.0 4.1  CL 105 104 104 101 98  CO2 16* 19* 17* 19* 19*  GLUCOSE 93 91 96 93 90  BUN 45* 40* 45* 34* 43*  CREATININE 6.21* 6.18* 7.15* 5.75* 6.55*  CALCIUM 8.4* 8.0* 8.3* 8.5* 8.9  MG  --   --  1.9  --   --   PHOS 7.1* 6.5* 7.2* 5.5* 5.5*   Estimated Creatinine Clearance: 25.5 mL/min (A) (by C-G formula based on SCr of 6.55 mg/dL (H)). Liver & Pancreas: Recent Labs  Lab 07/25/19 0347 07/26/19 1054 07/27/19 0342 07/28/19 0402 07/29/19 0732  ALBUMIN 2.2* 2.1* 2.2* 2.0* 1.9*   No results for input(s): LIPASE, AMYLASE in the last 168 hours. No results for input(s): AMMONIA in the last 168 hours. Diabetic: No results for input(s): HGBA1C in the last 72 hours. No results for input(s): GLUCAP in the last 168 hours. Cardiac Enzymes: Recent Labs  Lab 07/29/19 0732  CKTOTAL <5*   No results for input(s): PROBNP in the last 8760 hours. Coagulation Profile: Recent Labs  Lab 07/25/19 0347 07/26/19 1031 07/27/19 0342 07/28/19  0402 07/29/19 0732  INR 1.8* 2.1* 2.0* 1.9* 1.8*   Thyroid Function Tests: No results for input(s): TSH, T4TOTAL,  FREET4, T3FREE, THYROIDAB in the last 72 hours. Lipid Profile: No results for input(s): CHOL, HDL, LDLCALC, TRIG, CHOLHDL, LDLDIRECT in the last 72 hours. Anemia Panel: No results for input(s): VITAMINB12, FOLATE, FERRITIN, TIBC, IRON, RETICCTPCT in the last 72 hours. Urine analysis:    Component Value Date/Time   COLORURINE YELLOW 07/25/2019 0635   APPEARANCEUR TURBID (A) 07/25/2019 0635   LABSPEC 1.019 07/25/2019 0635   PHURINE 5.0 07/25/2019 0635   GLUCOSEU NEGATIVE 07/25/2019 0635   HGBUR MODERATE (A) 07/25/2019 0635   BILIRUBINUR NEGATIVE 07/25/2019 [place] 07/25/2019 0635   PROTEINUR 100 (A) 07/25/2019 0635   UROBILINOGEN 0.2 05/11/2013 1903   NITRITE NEGATIVE 07/25/2019 0635   LEUKOCYTESUR NEGATIVE 07/25/2019 0635   Sepsis Labs: Invalid input(s): PROCALCITONIN, [place]  Microbiology: Recent Results (from the past 240 hour(s))  Blood culture (routine x 2)     Status: None   Collection Time: 07/19/19 11:52 AM   Specimen: Right Antecubital; Blood  Result Value Ref Range Status   Specimen Description   Final    RIGHT ANTECUBITAL BOTTLES DRAWN AEROBIC AND ANAEROBIC   Special Requests Blood Culture adequate volume  Final   Culture   Final    NO GROWTH 5 DAYS Performed at [facility], [address] [place], [place] [address]    Report Status 07/24/2019 FINAL  Final  Blood culture (routine x 2)     Status: None   Collection Time: 07/19/19 12:27 PM   Specimen: Left Antecubital; Blood  Result Value Ref Range Status   Specimen Description   Final    LEFT ANTECUBITAL BOTTLES DRAWN AEROBIC AND ANAEROBIC   Special Requests Blood Culture adequate volume  Final   Culture   Final    NO GROWTH 5 DAYS Performed at [facility], [address] [place], [place] [address]    Report Status 07/24/2019 FINAL  Final  SARS Coronavirus 2 [facility] order, Performed in [facility] hospital lab) Nasopharyngeal Nasopharyngeal Swab     Status: None   Collection Time:  07/19/19 12:50 PM   Specimen: Nasopharyngeal Swab  Result Value Ref Range Status   SARS Coronavirus 2 NEGATIVE NEGATIVE Final    Comment: (NOTE) If result is NEGATIVE SARS-CoV-2 target nucleic acids are NOT DETECTED. The SARS-CoV-2 RNA is generally detectable in upper and lower  respiratory specimens during the acute phase of infection. The lowest  concentration of SARS-CoV-2 viral copies this assay can detect is 250  copies / mL. A negative result does not preclude SARS-CoV-2 infection  and should not be used as the sole basis for treatment or other  patient management decisions.  A negative result may occur with  improper specimen collection / handling, submission of specimen other  than nasopharyngeal swab, presence of viral mutation(s) within the  areas targeted by this assay, and inadequate number of viral copies  (<250 copies / mL). A negative result must be combined with clinical  observations, patient history, and epidemiological information. If result is POSITIVE SARS-CoV-2 target nucleic acids are DETECTED. The SARS-CoV-2 RNA is generally detectable in upper and lower  respiratory specimens dur ing the acute phase of infection.  Positive  results are indicative of active infection with SARS-CoV-2.  Clinical  correlation with patient history and other diagnostic information is  necessary to determine patient infection status.  Positive results do  not rule out  bacterial infection or co-infection with other viruses. If result is PRESUMPTIVE POSTIVE SARS-CoV-2 nucleic acids MAY BE PRESENT.   A presumptive positive result was obtained on the submitted specimen  and confirmed on repeat testing.  While 2019 novel coronavirus  (SARS-CoV-2) nucleic acids may be present in the submitted sample  additional confirmatory testing may be necessary for epidemiological  and / or clinical management purposes  to differentiate between  SARS-CoV-2 and other Sarbecovirus currently known to  infect humans.  If clinically indicated additional testing with an alternate test  methodology [phone]) is advised. The SARS-CoV-2 RNA is generally  detectable in upper and lower respiratory sp ecimens during the acute  phase of infection. The expected result is Negative. Fact Sheet for Patients:  StrictlyIdeas.no Fact Sheet for Healthcare Providers: BankingDealers.co.za This test is not yet approved or cleared by the [place] FDA and has been authorized for detection and/or diagnosis of SARS-CoV-2 by FDA under an Emergency Use Authorization (EUA).  This EUA will remain in effect (meaning this test can be used) for the duration of the COVID-19 declaration under Section 564(b)(1) of the Act, 21 U.S.C. section 360bbb-3(b)(1), unless the authorization is terminated or revoked sooner. Performed at [facility], [address], [place], [place] [address]   Surgical pcr screen     Status: None   Collection Time: 07/25/19  2:10 PM   Specimen: Nasal Mucosa; Nasal Swab  Result Value Ref Range Status   MRSA, PCR NEGATIVE NEGATIVE Final   Staphylococcus aureus NEGATIVE NEGATIVE Final    Comment: (NOTE) The Xpert SA Assay (FDA approved for NASAL specimens in patients 9 years of age and older), is one component of a comprehensive surveillance program. It is not intended to diagnose infection nor to guide or monitor treatment. Performed at [facility], [place] [address], [place],  [address]     Radiology Studies: No results found.  Ishmael Holter, M.D. [facility]  If 7PM-7AM, please contact night-coverage [email or web address] Password [email or web address] 07/29/2019, 10:08 AM

## 2019-07-29 NOTE — Progress Notes (Signed)
[facility]    NEPHROLOGY PROGRESS NOTE  SUBJECTIVE: first dialysis today.  Doing well.  No complaints.  Feeling urinary urgency.   OBJECTIVE:  Vitals:   07/29/19 1430 07/29/19 1500  BP: (!) 140/93 96/80  Pulse: (!) 102 (!) 102  Resp: 16 15  Temp:    SpO2:      Intake/Output Summary (Last 24 hours) at 07/29/2019 1529 Last data filed at 07/29/2019 0900 Gross per 24 hour  Intake 1080 ml  Output 0 ml  Net 1080 ml      General:  AAOx3 NAD HEENT:edentulous Neck:  No JVD CV:  Heart RRR Lungs:  Clear bilaterally no c/w/r Abd:  abd SNT/ND with normal BS, morbidly obese Extremities:  (+) deeply violaceous lesions right leg, (+)2 edema Skin:  No skin rash  MEDICATIONS:  . Chlorhexidine Gluconate Cloth  6 each Topical Q0600  . influenza vac split quadrivalent PF  0.5 mL Intramuscular Tomorrow-1000  . montelukast  10 mg Oral Daily  . nortriptyline  25 mg Oral QHS  . pantoprazole  40 mg Oral BID AC  . sertraline  100 mg Oral BID  . sevelamer carbonate  1,600 mg Oral TID WC  . topiramate  50 mg Oral Daily       LABS:   CBC Latest Ref Rng & Units 07/29/2019 07/29/2019 07/28/2019  WBC 4.0 - 10.5 K/uL 15.3(H) 16.2(H) -  Hemoglobin 12.0 - 15.0 g/dL 7.4(L) 7.2(L) 7.5(L)  Hematocrit 36.0 - 46.0 % 24.3(L) 24.5(L) 24.8(L)  Platelets 150 - 400 K/uL 433(H) 451(H) -    CMP Latest Ref Rng & Units 07/29/2019 07/28/2019 07/27/2019  Glucose 70 - 99 mg/dL 90 93 96  BUN 6 - 20 mg/dL 43(H) 34(H) 45(H)  Creatinine 0.44 - 1.00 mg/dL 6.55(H) 5.75(H) 7.15(H)  Sodium 135 - 145 mmol/L 133(L) 135 136  Potassium 3.5 - 5.1 mmol/L 4.1 4.0 4.7  Chloride 98 - 111 mmol/L 98 101 104  CO2 22 - 32 mmol/L 19(L) 19(L) 17(L)  Calcium 8.9 - 10.3 mg/dL 8.9 8.5(L) 8.3(L)  Total Protein 6.5 - 8.1 g/dL - - -  Total Bilirubin 0.3 - 1.2 mg/dL - - -  Alkaline Phos 38 - 126 U/L - - -  AST 15 - 41 U/L - - -  ALT 0 - 44 U/L - - -    Lab Results  Component Value Date   CALCIUM 8.9 07/29/2019   PHOS 5.5 (H) 07/29/2019       Component Value Date/Time   COLORURINE YELLOW 07/25/2019 0635   APPEARANCEUR TURBID (A) 07/25/2019 0635   LABSPEC 1.019 07/25/2019 0635   PHURINE 5.0 07/25/2019 0635   GLUCOSEU NEGATIVE 07/25/2019 0635   HGBUR MODERATE (A) 07/25/2019 0635   BILIRUBINUR NEGATIVE 07/25/2019 0635   KETONESUR NEGATIVE 07/25/2019 0635   PROTEINUR 100 (A) 07/25/2019 0635   UROBILINOGEN 0.2 05/11/2013 1903   NITRITE NEGATIVE 07/25/2019 0635   LEUKOCYTESUR NEGATIVE 07/25/2019 0635      Component Value Date/Time   PHART 7.371 05/30/2019 1655   PCO2ART 33.4 05/30/2019 1655   PO2ART 79.4 (L) 05/30/2019 1655   HCO3 18.9 (L) 05/30/2019 1655   TCO2 21 02/19/2017 0856   ACIDBASEDEF 5.4 (H) 05/30/2019 1655   O2SAT 96.2 05/30/2019 1655       Component Value Date/Time   IRON 10 (L) 07/24/2019 0911   TIBC 211 (L) 07/24/2019 0911   FERRITIN 77 07/24/2019 0911   IRONPCTSAT 5 (L) 07/24/2019 0911       ASSESSMENT/PLAN:  This is a 43 year old lady with history of morbid obesity recent right lower extremity DVT July 2020.  She been taking Coumadin she has a history of necrotizing fasciitis and debridements of the buttock and gluteal wound in 2018.  She presented 07/19/2019 with right lower extremity pain induration ecchymosis and tenderness of the medial aspect of thigh.  Not able to get a CT scan secondary to body habitus.  She appears to have some acute kidney injury and nephrology was consulted.   Acute kidney injury.   [place] without obstruction.  Likely due to ATN / vanco.  Avoid nephrotoxins ACE inhibitor's ARB use nonsteroidal anti-inflammatory drugs.  Avoid IV contrast.  Avoid intra-arterial procedures.  Worsening renal function in the setting of anuria -started HD on Friday, 07/25/2019.  May need Foley to quantify UOP now with urgency  Calciphylaxis/Coumadin necrosis.  Aggressive wound care management.  Will increase sevelamer to manage phosphorus.  Morbid obesity  RLE DVT.   Continue anticoag--> on argatroban gtt now  Hyperphosphatemia.  Continue sevelamer.  Anemia.  Status post PRBCs.

## 2019-07-29 NOTE — Progress Notes (Addendum)
4 Days Post-Op    CC:  Skin breakdown  Subjective: P atient is a 43 year old female with class III obesity was recently diagnosed with DVT and had an IVC filter placed for prior pulmonary embolus.  She presented with an INR of 8 and had significant ecchymosis along the saphenous distribution of her right thigh.  We are originally asked to do a skin biopsy to rule out warfarin-induced necrosis.  Her only skin changes was along that site.  It was Dr. Amie Portland opinion that a biopsy would not be helpful.    Since then she is developed some blebs on her lower thigh in the areas of the ecchymosis.  She also has a little bit of skin breakdown lower lateral medial border of the ecchymosis on her thigh.  The cellulitis on the lower portion of her leg is about the same.  There may be a little bit more erythema around the ecchymotic areas in the upper thigh.  Objective: Vital signs in last 24 hours: Temp:  [97.8 F (36.6 C)-98.3 F (36.8 C)] 97.8 F (36.6 C) (09/15 0738) Pulse Rate:  [98-109] 98 (09/15 0738) Resp:  [17-20] 17 (09/15 0738) BP: (100-160)/(43-55) 106/47 (09/15 0738) SpO2:  [92 %-98 %] 98 % (09/15 0738) Last BM Date: (unknown.  PTA) 1680 PO Nothing else on I/O Afebrile, VSS Creatinine 6.55 H/H 7.4/24.3 Platelets 433 INR 1.8 Heparin level 0.25  Intake/Output from previous day: 09/14 0701 - 09/15 0700 In: 1680 [P.O.:1680] Out: 0  Intake/Output this shift: No intake/output data recorded.  General appearance: alert, cooperative and no distress Extremities: She has 2 areas of early bleb formation on the lower leg.  This is all within the area significant ecchymosis.  Most of the skin around these blebs still looks intact and viable although there is significant heavy pigmentation from the ecchymosis.  The lower thigh has the same ecchymosis with heavy pigmentation.  There is one area in the lower lateral medial portion of the wound where the skin has broken down.  She also had  one area over her right l foot which is also incidentally over a vein that has some new ecchymosis that was not there last week.      Lab Results:  Recent Labs    07/28/19 0703 07/28/19 1050 07/29/19 0732  WBC 15.2*  --  16.2*  HGB 7.2* 7.5* 7.2*  HCT 24.3* 24.8* 24.5*  PLT 444*  --  451*    BMET Recent Labs    07/28/19 0402 07/29/19 0732  NA 135 133*  K 4.0 4.1  CL 101 98  CO2 19* 19*  GLUCOSE 93 90  BUN 34* 43*  CREATININE 5.75* 6.55*  CALCIUM 8.5* 8.9   PT/INR Recent Labs    07/28/19 0402 07/29/19 0732  LABPROT 21.1* 20.6*  INR 1.9* 1.8*    Recent Labs  Lab 07/25/19 0347 07/26/19 1054 07/27/19 0342 07/28/19 0402 07/29/19 0732  ALBUMIN 2.2* 2.1* 2.2* 2.0* 1.9*     Lipase     Component Value Date/Time   LIPASE 34 05/29/2019 1920     Medications: . Chlorhexidine Gluconate Cloth  6 each Topical Q0600  . heparin  2,000 Units Intravenous Once  . influenza vac split quadrivalent PF  0.5 mL Intramuscular Tomorrow-1000  . montelukast  10 mg Oral Daily  . nortriptyline  25 mg Oral QHS  . pantoprazole  40 mg Oral BID AC  . sertraline  100 mg Oral BID  . sevelamer carbonate  1,600 mg  Oral TID WC  . topiramate  50 mg Oral Daily   . sodium chloride    . sodium chloride    .  ceFAZolin (ANCEF) IV 2 g (07/28/19 2138)  . heparin 4,000 Units/hr (07/29/19 [id number])     Assessment/Plan DVT on Coumadin -July 2020 Supratherapeutic Coumadin INR 8.3 on admission Hx PE with IVC Morbid obesity BMI 92 Anemia CKD -creatinine up to 2.08 Supra therapeutic INR GERD Hx necrotizing fasciitis with debridement of buttock/gluteal wound 02/16/2017, and 02/19/2017.   Ecchymosis, cellulitis, -now with new blistering lower extremity and skin breakdown upper extremity.  FEN: IV fluids/heart healthy diet DVT: Supratherapeutic INR 1.8 on heparin drip currently ID: Zosyn 9/5-9/8; vancomycin 9/5-9/8;Rocephin 9/8-9/10: Ancef 9/11 >> day 5  Plan: I discussed the care with  the nurse.  I think at this point I will just clean the sites with soap and water.  I have asked him to get Xeroform to cover the ecchymotic areas on both the upper thigh and the lower leg.  Then wrapped with Kerlix.  We will do this twice daily for now and see how she does.  This should protect the skin that she is losing and the skin around it.  Will also prevent any tape injuries.  I do not think there is any need reason to consider any type of surgical debridement.   LOS: 10 days    JENNINGS,WILLARD 07/29/2019 [phone]  Agree with above. The "injury" appears to be ecchymosis - possibly due to her poorly controled coumadin. She needs to walk and increase her activity.  She needs to loose weight and is aware of this. There are no general surgery plans for the ecchymosis.  Alphonsa Overall, MD, [facility] Pager: [phone] Office phone:  [phone]

## 2019-07-29 NOTE — Progress Notes (Signed)
ANTICOAGULATION CONSULT NOTE - Follow Up Consult  Pharmacy Consult for Heparin (Warfarin on hold) Indication: Recent DVT (05/24/19)  Allergies  Allergen Reactions  . Aspirin     Due to hx of stomach ulcers    Patient Measurements: Height: 5\' 5"  (165.1 cm) Weight: (scale inaccurate) IBW/kg (Calculated) : 57 Heparin Dosing Weight: 125.2  Vital Signs: Temp: 97.8 F (36.6 C) (09/15 0738) Temp Source: Oral (09/15 0738) BP: 106/47 (09/15 0738) Pulse Rate: 98 (09/15 0738)  Labs: Recent Labs    07/27/19 0342  07/28/19 0402 07/28/19 0703 07/28/19 1050 07/28/19 1408 07/28/19 2340 07/29/19 0732  HGB 7.7*   < >  --  7.2* 7.5*  --   --  7.2*  HCT 26.3*   < >  --  24.3* 24.8*  --   --  24.5*  PLT 486*  --   --  444*  --   --   --  451*  LABPROT 22.2*  --  21.1*  --   --   --   --  20.6*  INR 2.0*  --  1.9*  --   --   --   --  1.8*  HEPARINUNFRC <0.10*   < > <0.10*  --   --  <0.10* 0.16* 0.25*  CREATININE 7.15*  --  5.75*  --   --   --   --  6.55*   < > = values in this interval not displayed.    Estimated Creatinine Clearance: 25.5 mL/min (A) (by C-G formula based on SCr of 6.55 mg/dL (H)).   Medications:  Scheduled:  . Chlorhexidine Gluconate Cloth  6 each Topical Q0600  . influenza vac split quadrivalent PF  0.5 mL Intramuscular Tomorrow-1000  . montelukast  10 mg Oral Daily  . nortriptyline  25 mg Oral QHS  . pantoprazole  40 mg Oral BID AC  . sertraline  100 mg Oral BID  . sevelamer carbonate  1,600 mg Oral TID WC  . topiramate  50 mg Oral Daily    Assessment: 43 yr old female on Coumadin PTA for hx DVT 05/24/19.  INR 8.1 on admit and Coumadin held. Vitamin K 5 mg PO given on 9/7.  IV heparin begun on 9/8 pm when INR down to 1.7. s/p TDC placement on 9/11. INR today is 1.8. Heparin has been running at 3950 units/hr Heparin level still low this AM at 0.25   Goal of Therapy:  Heparin level 0.3-0.7 units/ml Monitor platelets by anticoagulation protocol: Yes     Plan:  -Heparin infusion to 4200 units/hr -Bolus 2000 units x1 -Heparin level in 6 hours -Daily heparin level and CBC  Yanai Hobson A. Levada Dy, PharmD, BCPS, FNKF Clinical Pharmacist [place] Please utilize Amion for appropriate phone number to reach the unit pharmacist ([place])

## 2019-07-29 NOTE — Progress Notes (Signed)
Heparin rate changed to 39.5 mL/hr per pharmacy order at [facility].  Verified by second RN, Cyril Loosen. Will continue to monitor.

## 2019-07-30 LAB — COMPREHENSIVE METABOLIC PANEL
ALT: <5 U/L (ref 0–44)
AST: 9 U/L — ABNORMAL LOW (ref 15–41)
Albumin: 1.9 g/dL — ABNORMAL LOW (ref 3.5–5.0)
Alkaline Phosphatase: 98 U/L (ref 38–126)
Anion gap: 14 (ref 5–15)
BUN: 25 mg/dL — ABNORMAL HIGH (ref 6–20)
CO2: 23 mmol/L (ref 22–32)
Calcium: 8.8 mg/dL — ABNORMAL LOW (ref 8.9–10.3)
Chloride: 98 mmol/L (ref 98–111)
Creatinine, Ser: 4.45 mg/dL — ABNORMAL HIGH (ref 0.44–1.00)
GFR calc Af Amer: 13 mL/min — ABNORMAL LOW (ref >60)
GFR calc non Af Amer: 11 mL/min — ABNORMAL LOW (ref >60)
Glucose, Bld: 92 mg/dL (ref 70–99)
Potassium: 3.4 mmol/L — ABNORMAL LOW (ref 3.5–5.1)
Sodium: 135 mmol/L (ref 135–145)
Total Bilirubin: 0.7 mg/dL (ref 0.3–1.2)
Total Protein: 7.2 g/dL (ref 6.5–8.1)

## 2019-07-30 LAB — APTT
aPTT: 105 seconds — ABNORMAL HIGH (ref 24–36)
aPTT: 101 seconds — ABNORMAL HIGH (ref 24–36)
aPTT: 97 seconds — ABNORMAL HIGH (ref 24–36)
aPTT: 84 seconds — ABNORMAL HIGH (ref 24–36)
aPTT: 79 seconds — ABNORMAL HIGH (ref 24–36)

## 2019-07-30 LAB — PROTIME-INR
INR: 2.8 — ABNORMAL HIGH (ref 0.8–1.2)
Prothrombin Time: 29 seconds — ABNORMAL HIGH (ref 11.4–15.2)

## 2019-07-30 LAB — PHOSPHORUS: Phosphorus: 4.1 mg/dL (ref 2.5–4.6)

## 2019-07-30 NOTE — Plan of Care (Signed)
  Problem: Skin Integrity: Goal: Risk for impaired skin integrity will decrease Outcome: Progressing   Problem: Pain Managment: Goal: General experience of comfort will improve Outcome: Progressing   Problem: Elimination: Goal: Will not experience complications related to bowel motility Outcome: Progressing

## 2019-07-30 NOTE — Progress Notes (Signed)
Per Pharmacy to hold argatroban gtt for 30 minutes. Restart rate at 0.73mcg/kg/min (4.13ml/hr). Lab draw to follow 2 hours post rate change and restart. IV site noted C/D/I. No bleeding noted, pt will continue to be monitored.

## 2019-07-30 NOTE — Progress Notes (Signed)
[facility]    NEPHROLOGY PROGRESS NOTE  SUBJECTIVE: Feeling well.  HD yesterday with 4L removed.  Foley inserted- pt reports a lot came out, nothing charted.  ~200 cc in bag on my eval.     OBJECTIVE:  Vitals:   07/30/19 0312 07/30/19 0824  BP: (!) 112/59 (!) 115/54  Pulse: (!) 107 100  Resp: 18 18  Temp: 97.6 F (36.4 C) (!) 97.3 F (36.3 C)  SpO2: 94% (!) 88%    Intake/Output Summary (Last 24 hours) at 07/30/2019 1132 Last data filed at 07/30/2019 [id number] Gross per 24 hour  Intake 614.03 ml  Output 4000 ml  Net -3385.97 ml      General:  AAOx3 NAD HEENT:edentulous Neck:  No JVD CV:  Heart RRR Lungs:  Clear bilaterally no c/w/r Abd:  abd SNT/ND with normal BS, morbidly obese Extremities:  (+) deeply violaceous lesions right leg (dressed today), (+)2 edema ACCESS: R IJ TDC  MEDICATIONS:  . Chlorhexidine Gluconate Cloth  6 each Topical Q0600  . influenza vac split quadrivalent PF  0.5 mL Intramuscular Tomorrow-1000  . montelukast  10 mg Oral Daily  . nortriptyline  25 mg Oral QHS  . pantoprazole  40 mg Oral BID AC  . sertraline  100 mg Oral BID  . sevelamer carbonate  1,600 mg Oral TID WC  . topiramate  50 mg Oral Daily       LABS:   CBC Latest Ref Rng & Units 07/29/2019 07/29/2019 07/28/2019  WBC 4.0 - 10.5 K/uL 15.3(H) 16.2(H) -  Hemoglobin 12.0 - 15.0 g/dL 7.4(L) 7.2(L) 7.5(L)  Hematocrit 36.0 - 46.0 % 24.3(L) 24.5(L) 24.8(L)  Platelets 150 - 400 K/uL 433(H) 451(H) -    CMP Latest Ref Rng & Units 07/30/2019 07/29/2019 07/28/2019  Glucose 70 - 99 mg/dL 92 90 93  BUN 6 - 20 mg/dL 25(H) 43(H) 34(H)  Creatinine 0.44 - 1.00 mg/dL 4.45(H) 6.55(H) 5.75(H)  Sodium 135 - 145 mmol/L 135 133(L) 135  Potassium 3.5 - 5.1 mmol/L 3.4(L) 4.1 4.0  Chloride 98 - 111 mmol/L 98 98 101  CO2 22 - 32 mmol/L 23 19(L) 19(L)  Calcium 8.9 - 10.3 mg/dL 8.8(L) 8.9 8.5(L)  Total Protein 6.5 - 8.1 g/dL 7.2 - -  Total Bilirubin 0.3 - 1.2 mg/dL 0.7 - -  Alkaline Phos 38  - 126 U/L 98 - -  AST 15 - 41 U/L 9(L) - -  ALT 0 - 44 U/L <5 - -    Lab Results  Component Value Date   CALCIUM 8.8 (L) 07/30/2019   PHOS 4.1 07/30/2019       Component Value Date/Time   COLORURINE YELLOW 07/25/2019 0635   APPEARANCEUR TURBID (A) 07/25/2019 0635   LABSPEC 1.019 07/25/2019 0635   PHURINE 5.0 07/25/2019 0635   GLUCOSEU NEGATIVE 07/25/2019 0635   HGBUR MODERATE (A) 07/25/2019 0635   BILIRUBINUR NEGATIVE 07/25/2019 0635   KETONESUR NEGATIVE 07/25/2019 0635   PROTEINUR 100 (A) 07/25/2019 0635   UROBILINOGEN 0.2 05/11/2013 1903   NITRITE NEGATIVE 07/25/2019 0635   LEUKOCYTESUR NEGATIVE 07/25/2019 0635      Component Value Date/Time   PHART 7.371 05/30/2019 1655   PCO2ART 33.4 05/30/2019 1655   PO2ART 79.4 (L) 05/30/2019 1655   HCO3 18.9 (L) 05/30/2019 1655   TCO2 21 02/19/2017 0856   ACIDBASEDEF 5.4 (H) 05/30/2019 1655   O2SAT 96.2 05/30/2019 1655       Component Value Date/Time   IRON 10 (L) 07/24/2019 [id number]  TIBC 211 (L) 07/24/2019 0911   FERRITIN 77 07/24/2019 0911   IRONPCTSAT 5 (L) 07/24/2019 0911       ASSESSMENT/PLAN:    This is a 43 year old lady with history of morbid obesity recent right lower extremity DVT July 2020.  She been taking Coumadin she has a history of necrotizing fasciitis and debridements of the buttock and gluteal wound in 2018.  She presented 07/19/2019 with right lower extremity pain induration ecchymosis and tenderness of the medial aspect of thigh.  Not able to get a CT scan secondary to body habitus.  She appears to have some acute kidney injury and nephrology was consulted.   Acute kidney injury.   [place] without obstruction.  Likely due to ATN / vanco.  Avoid nephrotoxins ACE inhibitor's ARB use nonsteroidal anti-inflammatory drugs.  Avoid IV contrast.  Avoid intra-arterial procedures.  Worsening renal function in the setting of anuria -started HD on Friday, 07/25/2019.  Looks like urine output is picking up a little bit.  Continue  Foley catheter to quantify, Strict I/O.  Will make daily determination for HD.    Calciphylaxis/Coumadin necrosis.  Aggressive wound care management.  Will increase sevelamer to manage phosphorus.  On Ancef for superimposed cellulitis.  Morbid obesity  RLE DVT.  Continue anticoag--> on argatroban gtt now  Hyperphosphatemia.  Continue sevelamer.  Anemia.  Status post PRBCs.

## 2019-07-30 NOTE — Progress Notes (Addendum)
ANTICOAGULATION CONSULT NOTE - Follow Up Consult  Pharmacy Consult for Heparin >>Argatroban Indication: Recent DVT (05/24/19)  Allergies  Allergen Reactions  . Aspirin     Due to hx of stomach ulcers    Patient Measurements: Height: 5\' 5"  (165.1 cm) Weight: (!) 612 lb 14.1 oz (278 kg) IBW/kg (Calculated) : 57 Heparin Dosing Weight: 125.2 Actual weight recorded 282.3 kg  Vital Signs: Temp: 97.3 F (36.3 C) (09/16 0824) Temp Source: Oral (09/16 0824) BP: 115/54 (09/16 0824) Pulse Rate: 100 (09/16 0824)  Labs: Recent Labs    07/28/19 0402 07/28/19 0703 07/28/19 1050  07/28/19 2340 07/29/19 0732 07/29/19 1029  07/29/19 1635  07/30/19 0301 07/30/19 0621 07/30/19 1003  HGB  --  7.2* 7.5*  --   --  7.2* 7.4*  --   --   --   --   --   --   HCT  --  24.3* 24.8*  --   --  24.5* 24.3*  --   --   --   --   --   --   PLT  --  444*  --   --   --  451* 433*  --   --   --   --   --   --   APTT  --   --   --   --   --   --   --    < >  --    < > 105* 101* 97*  LABPROT 21.1*  --   --   --   --  20.6*  --   --   --   --  29.0*  --   --   INR 1.9*  --   --   --   --  1.8*  --   --   --   --  2.8*  --   --   HEPARINUNFRC <0.10*  --   --    < > 0.16* 0.25*  --   --  <0.10*  --   --   --   --   CREATININE 5.75*  --   --   --   --  6.55*  --   --   --   --  4.45*  --   --   CKTOTAL  --   --   --   --   --  <5*  --   --   --   --   --   --   --    < > = values in this interval not displayed.    Estimated Creatinine Clearance: 37 mL/min (A) (by C-G formula based on SCr of 4.45 mg/dL (H)).   Medications:  Scheduled:  . Chlorhexidine Gluconate Cloth  6 each Topical Q0600  . influenza vac split quadrivalent PF  0.5 mL Intramuscular Tomorrow-1000  . montelukast  10 mg Oral Daily  . nortriptyline  25 mg Oral QHS  . pantoprazole  40 mg Oral BID AC  . sertraline  100 mg Oral BID  . sevelamer carbonate  1,600 mg Oral TID WC  . topiramate  50 mg Oral Daily    Assessment: 43 yr old  female on Coumadin PTA for hx DVT 05/24/19.  INR 8.1 on admit and Coumadin held. Vitamin K 5 mg PO given on 9/7.  IV heparin begun on 9/8 pm when INR down to 1.7. s/p TDC placement on 9/11. INR today is 1.8. Heparin has been  running at 3950 units/hr and heparin level still low this AM at 0.25. Heparin rate has reached max rate on smart pumps. Patient may be experiencing heparin resistance due to subtherapeutic heparin levels at very high rate of heparin. Antithombin III level drawn which was below the normal range. D/W Dr. Doristine Bosworth. Will start patient on direct thrombin inhibitor therapy. Due to renal function issues will choose argatroban at a dose of 21mcg/kg/min for non-critically ill patients based on actual BW per protocol.   Repeat PTT this AM 97 sec (goal 50-90). No bleeding noted INR 2.8, but argatroban falsely elevates the INR.   Goal of Therapy:  APTT 50-90 sec Monitor platelets by anticoagulation protocol: Yes    Plan:  -Decrease  argatroban to 0.1 mcg/kg/min (20% decrease) -APTT in 2 hours  -Daily APTT and CBC  Nabiha Planck A. Levada Dy, PharmD, BCPS, FNKF Clinical Pharmacist [place] Please utilize Amion for appropriate phone number to reach the unit pharmacist ([place])   Addendum:  APTT 84 sec on current rate of 0.1 mcg/kg/min. Continue current rate and get confirmatory APTT in 2 hours.   Deamber Buckhalter A. Levada Dy, PharmD, BCPS, FNKF Clinical Pharmacist [place] Please utilize Amion for appropriate phone number to reach the unit pharmacist ([place])

## 2019-07-30 NOTE — Progress Notes (Signed)
ANTICOAGULATION CONSULT NOTE - Follow Up Consult  Pharmacy Consult for Heparin >>Argatroban Indication: Recent DVT (05/24/19)  Allergies  Allergen Reactions  . Aspirin     Due to hx of stomach ulcers    Patient Measurements: Height: 5\' 5"  (165.1 cm) Weight: (!) 612 lb 14.1 oz (278 kg) IBW/kg (Calculated) : 57 Heparin Dosing Weight: 125.2 Actual weight recorded 282.3 kg  Vital Signs: Temp: 98.6 F (37 C) (09/16 1554) Temp Source: Oral (09/16 1554) BP: 103/52 (09/16 1554) Pulse Rate: 93 (09/16 1554)  Labs: Recent Labs    07/28/19 0402 07/28/19 0703 07/28/19 1050  07/28/19 2340 07/29/19 0732 07/29/19 1029  07/29/19 1635  07/30/19 0301  07/30/19 1003 07/30/19 1419 07/30/19 1631  HGB  --  7.2* 7.5*  --   --  7.2* 7.4*  --   --   --   --   --   --   --   --   HCT  --  24.3* 24.8*  --   --  24.5* 24.3*  --   --   --   --   --   --   --   --   PLT  --  444*  --   --   --  451* 433*  --   --   --   --   --   --   --   --   APTT  --   --   --   --   --   --   --    < >  --    < > 105*   < > 97* 84* 79*  LABPROT 21.1*  --   --   --   --  20.6*  --   --   --   --  29.0*  --   --   --   --   INR 1.9*  --   --   --   --  1.8*  --   --   --   --  2.8*  --   --   --   --   HEPARINUNFRC <0.10*  --   --    < > 0.16* 0.25*  --   --  <0.10*  --   --   --   --   --   --   CREATININE 5.75*  --   --   --   --  6.55*  --   --   --   --  4.45*  --   --   --   --   CKTOTAL  --   --   --   --   --  <5*  --   --   --   --   --   --   --   --   --    < > = values in this interval not displayed.    Estimated Creatinine Clearance: 37 mL/min (A) (by C-G formula based on SCr of 4.45 mg/dL (H)).   Medications:  Scheduled:  . Chlorhexidine Gluconate Cloth  6 each Topical Q0600  . influenza vac split quadrivalent PF  0.5 mL Intramuscular Tomorrow-1000  . montelukast  10 mg Oral Daily  . nortriptyline  25 mg Oral QHS  . pantoprazole  40 mg Oral BID AC  . sertraline  100 mg Oral BID  .  sevelamer carbonate  1,600 mg Oral TID WC  . topiramate  50 mg Oral  Daily    Assessment: 43 yr old female on Coumadin PTA for hx DVT 05/24/19.  INR 8.1 on admit and Coumadin held. Vitamin K 5 mg PO given on 9/7.  IV heparin begun on 9/8 pm when INR down to 1.7. s/p TDC placement on 9/11. INR today is 1.8. Heparin has been running at 3950 units/hr and heparin level still low this AM at 0.25. Heparin rate has reached max rate on smart pumps. Patient may be experiencing heparin resistance due to subtherapeutic heparin levels at very high rate of heparin. Antithombin III level drawn which was below the normal range. D/W Dr. Doristine Bosworth. Will start patient on direct thrombin inhibitor therapy. Due to renal function issues will choose argatroban at a dose of 17mcg/kg/min for non-critically ill patients based on actual BW per protocol.   Repeat PTT this AM 97 sec (goal 50-90). No bleeding noted INR 2.8, but argatroban falsely elevates the INR.  PM PTT therapeutic   Goal of Therapy:  APTT 50-90 sec Monitor platelets by anticoagulation protocol: Yes    Plan:  -Decrease  argatroban to 0.1 mcg/kg/min (20% decrease) -Daily APTT and CBC  Thank you Anette Guarneri, PharmD

## 2019-07-30 NOTE — Progress Notes (Addendum)
ANTICOAGULATION CONSULT NOTE - Follow Up Consult  Pharmacy Consult for Heparin >>Argatroban Indication: Recent DVT (05/24/19)  Allergies  Allergen Reactions  . Aspirin     Due to hx of stomach ulcers    Patient Measurements: Height: 5\' 5"  (165.1 cm) Weight: (!) 623 lb 14.4 oz (283 kg) IBW/kg (Calculated) : 57 Heparin Dosing Weight: 125.2 Actual weight recorded 282.3 kg  Vital Signs: Temp: 97.6 F (36.4 C) (09/16 0312) Temp Source: Oral (09/16 0312) BP: 112/59 (09/16 0312) Pulse Rate: 107 (09/16 0312)  Labs: Recent Labs    07/28/19 0402 07/28/19 0703 07/28/19 1050  07/28/19 2340 07/29/19 0732 07/29/19 1029  07/29/19 1635  07/29/19 2200 07/30/19 0301 07/30/19 0621  HGB  --  7.2* 7.5*  --   --  7.2* 7.4*  --   --   --   --   --   --   HCT  --  24.3* 24.8*  --   --  24.5* 24.3*  --   --   --   --   --   --   PLT  --  444*  --   --   --  451* 433*  --   --   --   --   --   --   APTT  --   --   --   --   --   --   --    < >  --    < > 136* 105* 101*  LABPROT 21.1*  --   --   --   --  20.6*  --   --   --   --   --  29.0*  --   INR 1.9*  --   --   --   --  1.8*  --   --   --   --   --  2.8*  --   HEPARINUNFRC <0.10*  --   --    < > 0.16* 0.25*  --   --  <0.10*  --   --   --   --   CREATININE 5.75*  --   --   --   --  6.55*  --   --   --   --   --  4.45*  --   CKTOTAL  --   --   --   --   --  <5*  --   --   --   --   --   --   --    < > = values in this interval not displayed.    Estimated Creatinine Clearance: 37.5 mL/min (A) (by C-G formula based on SCr of 4.45 mg/dL (H)).   Medications:  Scheduled:  . Chlorhexidine Gluconate Cloth  6 each Topical Q0600  . influenza vac split quadrivalent PF  0.5 mL Intramuscular Tomorrow-1000  . montelukast  10 mg Oral Daily  . nortriptyline  25 mg Oral QHS  . pantoprazole  40 mg Oral BID AC  . sertraline  100 mg Oral BID  . sevelamer carbonate  1,600 mg Oral TID WC  . topiramate  50 mg Oral Daily    Assessment: 43 yr old  female on Coumadin PTA for hx DVT 05/24/19.  INR 8.1 on admit and Coumadin held. Vitamin K 5 mg PO given on 9/7.  IV heparin begun on 9/8 pm when INR down to 1.7. s/p TDC placement on 9/11. INR today is 1.8. Heparin has been  running at 3950 units/hr and heparin level still low this AM at 0.25. Heparin rate has reached max rate on smart pumps. Patient may be experiencing heparin resistance due to subtherapeutic heparin levels at very high rate of heparin. Antithombin III level drawn which was below the normal range. D/W Dr. Doristine Bosworth. Will start patient on direct thrombin inhibitor therapy. Due to renal function issues will choose argatroban at a dose of 82mcg/kg/min for non-critically ill patients based on actual BW per protocol.   PTT this AM 101 sec (goal 50-90). No bleeding noted INR 2.8, but argatroban falsely elevates the INR.   Goal of Therapy:  APTT 50-90 sec Monitor platelets by anticoagulation protocol: Yes    Plan:  -Decrease  argatroban to 0.126 mcg/kg/min (30% decrease) -APTT in 2 hours  -Daily APTT and CBC  Yena Tisby A. Levada Dy, PharmD, BCPS, FNKF Clinical Pharmacist [place] Please utilize Amion for appropriate phone number to reach the unit pharmacist ([place])

## 2019-07-30 NOTE — Progress Notes (Signed)
PROGRESS NOTE  Megan Ortiz [id number] DOB: Aug 03, 1976   PCP: Larene Beach, MD  Patient is from: Home  DOA: 07/19/2019 LOS: 8  Brief Narrative / Interim history: Patient is a 43 year old Caucasian female, morbidly obese,asthma, hiatal hernia, gastric ulcer over 10 years ago, history of PE with history of IVC filter and recent RLE DVT on 05/2019 on Coumadin, history of necrotizing fasciitis with debridement of buttock and gluteal wound in 2018.  Patient presented with RLE pain, induration, ecchymosis and tenderness over the medial aspect.  Not able to obtain CT due to body habitus. Ultrasound imaging did not show any underlying fluid collections.    She was a started on vancomycin and Zosyn.  During the course of this hospitalization, patient has developed severe acute kidney injury.  Tunneled hemodialysis catheter has been placed, and patient has had 1 renal replacement therapy.  Likely, patient will be dialyzed again tomorrow.  Nephrology team's input is appreciated.  Patient is currently on heparin drip.  Coumadin is currently on hold.  On presentation, INR range from 8.1 to 9.9.   Assessment & Plan: Skin necrosis right lower extremity: Very challenging.  This could be warfarin induced skin necrosis however she was bridged with heparin along with warfarin and heparin was stopped almost 2 months ago but she developed necrosis only 10 days ago which is not a typical timing.  She started having some blisters so general surgery was reconsulted on 07/29/2019 however they did not recommend any intervention but recommended dressing changes and wound care is doing that.  Patient is feeling better today.  Per pharmacy, requiring a lot of heparin and is still not therapeutic.  We will check a T3.  Should ideally also check protein C to make sure she does not have protein C deficiency however she was on Coumadin lately which may alter the results.  Will wait for few days.  Please refer to following  images from 07/29/2019.  Please refer to Dr. Kyra Searles note from 07/28/2019 for previous images from 07/19/2019 for comparison purpose.       Right lower extremity DVT:  Diagnosed in 05/2019.   Patient was started on Coumadin.   INR was supra elevated on presentation, 8.1 and peaked at 9.9.   Coumadin is on hold.   Patient is currently on  INR today is 1.8.  Patient currently on argatroban.  Acute on chronic anemia:  Possibly due to acute blood loss.  Hemoglobin dropped up to 6.6.  Seems to be total 2 unit of PRBC transfusion so far.  Hemoglobin over 7 which is her baseline.  Monitor closely and transfuse if drops less than 7.  AKI: Nephrology team is managing.   This is likely multifactorial (will defer to the nephrology team)  -[facility] placed on 07/25/2019 for renal replacement therapy given..  Further management deferred to nephrology.  Last dialysis was 07/29/2019.  Essential hypertension:  Blood pressure stable.  Monitor closely.  GERD:  -On PPI.    Mood disorder:  Stable. -Continue home medication  Morbid obesity: BMI 104 -Needs lifestyle change to lose weight. -May benefit from bariatric surgery outpatient.  DVT prophylaxis: On argatroban Code Status: Full code Family Communication: No family present at bedside.  Plan of care discussed with patient in detail. Disposition Plan: TBD Consultants: Nephrology, vascular surgery   Subjective: Patient seen and examined.  She feels better.  She has no complaints.  Objective: Vitals:   07/29/19 1643 07/29/19 2042 07/30/19 0312 07/30/19 0824  BP: (!) 123/53 Marland Kitchen)  141/71 (!) 112/59 (!) 115/54  Pulse: (!) 102 (!) 102 (!) 107 100  Resp: 15 20 18 18   Temp: 98 F (36.7 C) 97.8 F (36.6 C) 97.6 F (36.4 C) (!) 97.3 F (36.3 C)  TempSrc: Oral Oral Oral Oral  SpO2: 96% 93% 94% (!) 88%  Weight: (!) 278 kg     Height:        Intake/Output Summary (Last 24 hours) at 07/30/2019 1528 Last data filed at 07/30/2019 [id number] Gross per 24  hour  Intake 614.03 ml  Output 4000 ml  Net -3385.97 ml   Filed Weights   07/26/19 0021 07/29/19 1232 07/29/19 1643  Weight: (!) 282.3 kg (!) 283 kg (!) 278 kg    Examination:  General exam: Appears calm and comfortable, morbidly obese Respiratory system: Clear to auscultation. Respiratory effort normal. Cardiovascular system: S1 & S2 heard, RRR. No JVD, murmurs, rubs, gallops or clicks. No pedal edema. Gastrointestinal system: Abdomen is nondistended, soft and nontender. No organomegaly or masses felt. Normal bowel sounds heard. Central nervous system: Alert and oriented. No focal neurological deficits. Extremities: Symmetric 5 x 5 power. Skin: She has dressing in the right lower extremity Psychiatry: Judgement and insight appear normal. Mood & affect appropriate.   Procedures:  9/11-TDC  Microbiology summarized: SARS-CoV-2 negative. Blood cultures negative.  Antimicrobials: Anti-infectives (From admission, onward)   Start     Dose/Rate Route Frequency Ordered Stop   07/25/19 2200  ceFAZolin (ANCEF) IVPB 2g/100 mL premix  Status:  Discontinued     2 g 200 mL/hr over 30 Minutes Intravenous Every 24 hours 07/25/19 1311 07/25/19 1333   07/25/19 2200  ceFAZolin (ANCEF) IVPB 2g/100 mL premix     2 g 200 mL/hr over 30 Minutes Intravenous Every 24 hours 07/25/19 1334     07/25/19 1345  ceFAZolin (ANCEF) IVPB 2g/100 mL premix  Status:  Discontinued     2 g 200 mL/hr over 30 Minutes Intravenous Every 12 hours 07/25/19 1333 07/25/19 1334   07/25/19 1100  ceFAZolin (ANCEF) IVPB 2g/100 mL premix  Status:  Discontinued     2 g 200 mL/hr over 30 Minutes Intravenous Every 12 hours 07/25/19 1023 07/25/19 1311   07/22/19 1730  cefTRIAXone (ROCEPHIN) 2 g in sodium chloride 0.9 % 100 mL IVPB  Status:  Discontinued     2 g 200 mL/hr over 30 Minutes Intravenous Every 24 hours 07/22/19 1704 07/24/19 1314   07/20/19 0615  vancomycin (VANCOCIN) 1,500 mg in sodium chloride 0.9 % 500 mL IVPB   Status:  Discontinued     1,500 mg 250 mL/hr over 120 Minutes Intravenous Every 8 hours 07/20/19 0610 07/22/19 0825   07/19/19 2200  Vancomycin (VANCOCIN) 1,500 mg in sodium chloride 0.9 % 500 mL IVPB  Status:  Discontinued     1,500 mg 250 mL/hr over 120 Minutes Intravenous Every 8 hours 07/19/19 1846 07/20/19 0606   07/19/19 2130  piperacillin-tazobactam (ZOSYN) IVPB 3.375 g  Status:  Discontinued     3.375 g 100 mL/hr over 30 Minutes Intravenous  Once 07/19/19 2122 07/19/19 2127   07/19/19 2130  vancomycin (VANCOCIN) IVPB 1000 mg/200 mL premix  Status:  Discontinued     1,000 mg 200 mL/hr over 60 Minutes Intravenous  Once 07/19/19 2122 07/19/19 2127   07/19/19 2130  piperacillin-tazobactam (ZOSYN) IVPB 3.375 g  Status:  Discontinued     3.375 g 12.5 mL/hr over 240 Minutes Intravenous Every 8 hours 07/19/19 1846 07/22/19 1704   07/19/19 1230  vancomycin (VANCOCIN) IVPB 1000 mg/200 mL premix     1,000 mg 200 mL/hr over 60 Minutes Intravenous Every 1 hr x 2 07/19/19 1215 07/19/19 1523   07/19/19 1230  piperacillin-tazobactam (ZOSYN) IVPB 3.375 g     3.375 g 12.5 mL/hr over 240 Minutes Intravenous  Once 07/19/19 1215 07/19/19 1323      Sch Meds:  Scheduled Meds:  Chlorhexidine Gluconate Cloth  6 each Topical Q0600   influenza vac split quadrivalent PF  0.5 mL Intramuscular Tomorrow-1000   montelukast  10 mg Oral Daily   nortriptyline  25 mg Oral QHS   pantoprazole  40 mg Oral BID AC   sertraline  100 mg Oral BID   sevelamer carbonate  1,600 mg Oral TID WC   topiramate  50 mg Oral Daily   Continuous Infusions:  sodium chloride     sodium chloride     argatroban 0.1 mcg/kg/min (07/30/19 1234)    ceFAZolin (ANCEF) IV 2 g (07/29/19 2147)   PRN Meds:.sodium chloride, sodium chloride, acetaminophen **OR** acetaminophen, albuterol, alteplase, heparin, ondansetron **OR** ondansetron (ZOFRAN) IV, oxyCODONE, sodium chloride flush   I have personally reviewed the following  labs and images: CBC: Recent Labs  Lab 07/26/19 1031  07/27/19 0342 07/27/19 2005 07/28/19 0703 07/28/19 1050 07/29/19 0732 07/29/19 1029  WBC 14.1*  --  15.9*  --  15.2*  --  16.2* 15.3*  NEUTROABS  --   --   --   --  12.8*  --   --  13.2*  HGB 6.6*   < > 7.7* 7.6* 7.2* 7.5* 7.2* 7.4*  HCT 22.5*   < > 26.3* 25.1* 24.3* 24.8* 24.5* 24.3*  MCV 89.3  --  89.8  --  88.7  --  90.1 88.7  PLT 407*  --  486*  --  444*  --  451* 433*   < > = values in this interval not displayed.   BMP &GFR Recent Labs  Lab 07/26/19 1054 07/27/19 0342 07/28/19 0402 07/29/19 0732 07/30/19 0301  NA 136 136 135 133* 135  K 4.4 4.7 4.0 4.1 3.4*  CL 104 104 101 98 98  CO2 19* 17* 19* 19* 23  GLUCOSE 91 96 93 90 92  BUN 40* 45* 34* 43* 25*  CREATININE 6.18* 7.15* 5.75* 6.55* 4.45*  CALCIUM 8.0* 8.3* 8.5* 8.9 8.8*  MG  --  1.9  --   --   --   PHOS 6.5* 7.2* 5.5* 5.5* 4.1   Estimated Creatinine Clearance: 37 mL/min (A) (by C-G formula based on SCr of 4.45 mg/dL (H)). Liver & Pancreas: Recent Labs  Lab 07/26/19 1054 07/27/19 0342 07/28/19 0402 07/29/19 0732 07/30/19 0301  AST  --   --   --   --  9*  ALT  --   --   --   --  <5  ALKPHOS  --   --   --   --  98  BILITOT  --   --   --   --  0.7  PROT  --   --   --   --  7.2  ALBUMIN 2.1* 2.2* 2.0* 1.9* 1.9*   No results for input(s): LIPASE, AMYLASE in the last 168 hours. No results for input(s): AMMONIA in the last 168 hours. Diabetic: No results for input(s): HGBA1C in the last 72 hours. No results for input(s): GLUCAP in the last 168 hours. Cardiac Enzymes: Recent Labs  Lab 07/29/19 0732  CKTOTAL <5*  No results for input(s): PROBNP in the last 8760 hours. Coagulation Profile: Recent Labs  Lab 07/26/19 1031 07/27/19 0342 07/28/19 0402 07/29/19 0732 07/30/19 0301  INR 2.1* 2.0* 1.9* 1.8* 2.8*   Thyroid Function Tests: No results for input(s): TSH, T4TOTAL, FREET4, T3FREE, THYROIDAB in the last 72 hours. Lipid Profile: No  results for input(s): CHOL, HDL, LDLCALC, TRIG, CHOLHDL, LDLDIRECT in the last 72 hours. Anemia Panel: No results for input(s): VITAMINB12, FOLATE, FERRITIN, TIBC, IRON, RETICCTPCT in the last 72 hours. Urine analysis:    Component Value Date/Time   COLORURINE YELLOW 07/25/2019 0635   APPEARANCEUR TURBID (A) 07/25/2019 0635   LABSPEC 1.019 07/25/2019 0635   PHURINE 5.0 07/25/2019 0635   GLUCOSEU NEGATIVE 07/25/2019 0635   HGBUR MODERATE (A) 07/25/2019 0635   BILIRUBINUR NEGATIVE 07/25/2019 [place] 07/25/2019 0635   PROTEINUR 100 (A) 07/25/2019 0635   UROBILINOGEN 0.2 05/11/2013 1903   NITRITE NEGATIVE 07/25/2019 0635   LEUKOCYTESUR NEGATIVE 07/25/2019 0635   Sepsis Labs: Invalid input(s): PROCALCITONIN, [place]  Microbiology: Recent Results (from the past 240 hour(s))  Surgical pcr screen     Status: None   Collection Time: 07/25/19  2:10 PM   Specimen: Nasal Mucosa; Nasal Swab  Result Value Ref Range Status   MRSA, PCR NEGATIVE NEGATIVE Final   Staphylococcus aureus NEGATIVE NEGATIVE Final    Comment: (NOTE) The Xpert SA Assay (FDA approved for NASAL specimens in patients 32 years of age and older), is one component of a comprehensive surveillance program. It is not intended to diagnose infection nor to guide or monitor treatment. Performed at [facility], [place] [address] [place], [place] [address]     Radiology Studies: No results found.  Ishmael Holter, M.D. [facility]  If 7PM-7AM, please contact night-coverage [email or web address] Password [facility] 07/30/2019, 3:28 PM

## 2019-07-30 NOTE — Evaluation (Signed)
Physical Therapy Evaluation Patient Details Name: Megan Ortiz MRN: [id number] DOB: Jul 30, 1976 Today's Date: 07/30/2019   History of Present Illness  43 yo female admitted to ED on 9/5 with RLE tenderness, swelling, and skin breakdown; recent hospitalization 06/02/19 for RLE DVT. Pt with RLE cellulitis vs post-thrombotic syndrome. Pt with renal failure, nephrology following and pt on dialysis with IJ HD port. PMH includes morbid obesity with BMI of 102, necrotizing fasciitis, anemia, anxiety, PE, tachycardia, I&D buttocks x2 in 2018.    Clinical Impression  Pt presents with RLE pain, generalized weakness, difficulty mobilizing, morbid obesity limiting mobility technique, and decreased activity tolerance for sitting/standing. Pt to benefit from acute PT to address deficits. Pt required min-max assist +2 for bed mobility and transfer to standing, pt able to take single step forward and backward before fatiguing. PT recommending SNF to return to PLOF. PT to progress mobility as tolerated, and will continue to follow acutely.      Follow Up Recommendations SNF;Supervision/Assistance - 24 hour    Equipment Recommendations  None recommended by PT    Recommendations for Other Services       Precautions / Restrictions Precautions Precautions: Fall Precaution Comments: R IJ port for HD Restrictions Weight Bearing Restrictions: No Other Position/Activity Restrictions: WBAT      Mobility  Bed Mobility Overal bed mobility: Needs Assistance Bed Mobility: Supine to Sit;Sit to Supine     Supine to sit: Min assist;HOB elevated Sit to supine: Max assist;HOB elevated;+2 for physical assistance   General bed mobility comments: min assist for supine to sit for completion of translation of RLE to EOB, increased time and effort to perform. Exited bed toward L, per pt request. Pt required max assist +2 for bilateral LE lifting into bed, trendelenburg bed function utilized to scoot pt up in  bed.  Transfers Overall transfer level: Needs assistance Equipment used: Rolling walker (2 wheeled) Transfers: Sit to/from Stand Sit to Stand: Min assist;From elevated surface         General transfer comment: Min assist for power up, steadying. Pt with heavy anterior rocking to move CoM forward. Pt took one step forward and one step backward, returning to sitting.  Ambulation/Gait Ambulation/Gait assistance: (NT beyond 1 step, pt with RLE pain and fatigue)              Stairs            Wheelchair Mobility    Modified Rankin (Stroke Patients Only)       Balance Overall balance assessment: Needs assistance Sitting-balance support: No upper extremity supported;Feet supported Sitting balance-Leahy Scale: Good     Standing balance support: Bilateral upper extremity supported Standing balance-Leahy Scale: Poor Standing balance comment: reliant on external support, limited dynamic balance assessed due to pt fatigue                             Pertinent Vitals/Pain Pain Assessment: Faces Faces Pain Scale: Hurts little more Pain Location: RLE, including lower leg and thigh Pain Descriptors / Indicators: Sore Pain Intervention(s): Limited activity within patient's tolerance;Monitored during session;Repositioned    Home Living Family/patient expects to be discharged to:: Private residence Living Arrangements: Spouse/significant other Available Help at Discharge: Family;Available 24 hours/day Type of Home: House Home Access: Level entry   Entrance Stairs-Number of Steps: documented 1 step last hospitalization Home Layout: One level Home Equipment: Cane - single point;Walker - 2 wheels;Hand held shower head(bari RW)  Prior Function Level of Independence: Needs assistance   Gait / Transfers Assistance Needed: Pt reports ambulating with bari RW PTA, states she was using it lightly  ADL's / Homemaking Assistance Needed: Pt requires assist for  drying self after bathing or sponge bath        Hand Dominance   Dominant Hand: Right    Extremity/Trunk Assessment   Upper Extremity Assessment Upper Extremity Assessment: Overall WFL for tasks assessed    Lower Extremity Assessment Lower Extremity Assessment: Generalized weakness;RLE deficits/detail RLE Deficits / Details: increased time to perform AROM due to pain; able to perform ankle pumps, quad set, heel slide with increased time RLE: Unable to fully assess due to pain RLE Sensation: WNL    Cervical / Trunk Assessment Cervical / Trunk Assessment: Normal  Communication   Communication: No difficulties  Cognition Arousal/Alertness: Awake/alert Behavior During Therapy: WFL for tasks assessed/performed;Anxious Overall Cognitive Status: Within Functional Limits for tasks assessed                                 General Comments: anxiety with mobility      General Comments      Exercises General Exercises - Lower Extremity Ankle Circles/Pumps: AROM;Both;5 reps;Supine Quad Sets: AROM;Right;5 reps;Supine   Assessment/Plan    PT Assessment Patient needs continued PT services  PT Problem List Decreased strength;Decreased mobility;Decreased range of motion;Decreased activity tolerance;Decreased balance;Decreased knowledge of use of DME;Pain;Obesity       PT Treatment Interventions DME instruction;Therapeutic activities;Gait training;Therapeutic exercise;Patient/family education;Balance training;Functional mobility training    PT Goals (Current goals can be found in the Care Plan section)  Acute Rehab PT Goals Patient Stated Goal: return to walking with RW PT Goal Formulation: With patient Time For Goal Achievement: 08/13/19 Potential to Achieve Goals: Good    Frequency Min 2X/week   Barriers to discharge        Co-evaluation               AM-PAC PT "6 Clicks" Mobility  Outcome Measure Help needed turning from your back to your side  while in a flat bed without using bedrails?: A Lot Help needed moving from lying on your back to sitting on the side of a flat bed without using bedrails?: A Lot Help needed moving to and from a bed to a chair (including a wheelchair)?: A Lot Help needed standing up from a chair using your arms (e.g., wheelchair or bedside chair)?: A Lot Help needed to walk in hospital room?: Total Help needed climbing 3-5 steps with a railing? : Total 6 Click Score: 10    End of Session Equipment Utilized During Treatment: Gait belt Activity Tolerance: Patient limited by fatigue;Patient limited by pain Patient left: in bed;with call bell/phone within reach Nurse Communication: Mobility status PT Visit Diagnosis: Other abnormalities of gait and mobility (R26.89);Difficulty in walking, not elsewhere classified (R26.2);Muscle weakness (generalized) (M62.81)    Time: 1525-1550 PT Time Calculation (min) (ACUTE ONLY): 25 min   Charges:   PT Evaluation $PT Eval Low Complexity: 1 Low PT Treatments $Therapeutic Activity: 8-22 mins       Zevin Nevares Megan Ortiz, PT Acute Rehabilitation Services Pager [phone]  Office [phone]   Megan Ortiz 07/30/2019, 4:56 PM

## 2019-07-31 LAB — COMPREHENSIVE METABOLIC PANEL
ALT: <5 U/L (ref 0–44)
AST: 9 U/L — ABNORMAL LOW (ref 15–41)
Albumin: 2 g/dL — ABNORMAL LOW (ref 3.5–5.0)
Alkaline Phosphatase: 93 U/L (ref 38–126)
Anion gap: 13 (ref 5–15)
BUN: 32 mg/dL — ABNORMAL HIGH (ref 6–20)
CO2: 24 mmol/L (ref 22–32)
Calcium: 8.9 mg/dL (ref 8.9–10.3)
Chloride: 98 mmol/L (ref 98–111)
Creatinine, Ser: 5.21 mg/dL — ABNORMAL HIGH (ref 0.44–1.00)
GFR calc Af Amer: 11 mL/min — ABNORMAL LOW (ref >60)
GFR calc non Af Amer: 9 mL/min — ABNORMAL LOW (ref >60)
Glucose, Bld: 87 mg/dL (ref 70–99)
Potassium: 3.5 mmol/L (ref 3.5–5.1)
Sodium: 135 mmol/L (ref 135–145)
Total Bilirubin: 0.5 mg/dL (ref 0.3–1.2)
Total Protein: 7.5 g/dL (ref 6.5–8.1)

## 2019-07-31 LAB — PROTIME-INR
INR: 2.2 — ABNORMAL HIGH (ref 0.8–1.2)
Prothrombin Time: 24.3 seconds — ABNORMAL HIGH (ref 11.4–15.2)

## 2019-07-31 LAB — APTT: aPTT: 79 seconds — ABNORMAL HIGH (ref 24–36)

## 2019-07-31 LAB — PHOSPHORUS: Phosphorus: 4.5 mg/dL (ref 2.5–4.6)

## 2019-07-31 MED ORDER — FUROSEMIDE 10 MG/ML IJ SOLN
80.0000 mg | Freq: Once | INTRAMUSCULAR | Status: AC
  Administered 2019-07-31: 80 mg via INTRAVENOUS
  Filled 2019-07-31: qty 8

## 2019-07-31 MED ORDER — POTASSIUM CHLORIDE 20 MEQ PO PACK
20.0000 meq | PACK | Freq: Once | ORAL | Status: AC
  Administered 2019-07-31: 12:00:00 20 meq via ORAL
  Filled 2019-07-31: qty 1

## 2019-07-31 NOTE — Progress Notes (Signed)
ANTICOAGULATION CONSULT NOTE - Follow Up Consult  Pharmacy Consult for Heparin >>Argatroban Indication: Recent DVT (05/24/19)  Allergies  Allergen Reactions  . Aspirin     Due to hx of stomach ulcers    Patient Measurements: Height: 5\' 5"  (165.1 cm) Weight: (!) 612 lb 14.1 oz (278 kg) IBW/kg (Calculated) : 57 Heparin Dosing Weight: 125.2 Actual weight recorded 282.3 kg  Vital Signs: Temp: 98.7 F (37.1 C) (09/17 0854) Temp Source: Oral (09/17 0854) BP: 157/60 (09/17 0854) Pulse Rate: 98 (09/17 0854)  Labs: Recent Labs    07/28/19 1050  07/28/19 2340 07/29/19 0732 07/29/19 1029  07/29/19 1635  07/30/19 0301  07/30/19 1419 07/30/19 1631 07/31/19 0315 07/31/19 0819  HGB 7.5*  --   --  7.2* 7.4*  --   --   --   --   --   --   --   --   --   HCT 24.8*  --   --  24.5* 24.3*  --   --   --   --   --   --   --   --   --   PLT  --   --   --  451* 433*  --   --   --   --   --   --   --   --   --   APTT  --   --   --   --   --    < >  --    < > 105*   < > 84* 79*  --  79*  LABPROT  --   --   --  20.6*  --   --   --   --  29.0*  --   --   --  24.3*  --   INR  --   --   --  1.8*  --   --   --   --  2.8*  --   --   --  2.2*  --   HEPARINUNFRC  --    < > 0.16* 0.25*  --   --  <0.10*  --   --   --   --   --   --   --   CREATININE  --   --   --  6.55*  --   --   --   --  4.45*  --   --   --  5.21*  --   CKTOTAL  --   --   --  <5*  --   --   --   --   --   --   --   --   --   --    < > = values in this interval not displayed.    Estimated Creatinine Clearance: 31.6 mL/min (A) (by C-G formula based on SCr of 5.21 mg/dL (H)).   Medications:  Scheduled:  . Chlorhexidine Gluconate Cloth  6 each Topical Q0600  . influenza vac split quadrivalent PF  0.5 mL Intramuscular Tomorrow-1000  . montelukast  10 mg Oral Daily  . nortriptyline  25 mg Oral QHS  . pantoprazole  40 mg Oral BID AC  . sertraline  100 mg Oral BID  . sevelamer carbonate  1,600 mg Oral TID WC  . topiramate  50 mg  Oral Daily    Assessment: 43 yr old female on Coumadin PTA for hx DVT 05/24/19.  INR 8.1 on admit  and Coumadin held. Vitamin K 5 mg PO given on 9/7.  IV heparin begun on 9/8 pm when INR down to 1.7. s/p TDC placement on 9/11. INR today is 1.8. Heparin has been running at 3950 units/hr and heparin level still low this AM at 0.25. Heparin rate has reached max rate on smart pumps. Patient may be experiencing heparin resistance due to subtherapeutic heparin levels at very high rate of heparin. Antithombin III level drawn which was below the normal range. D/W Dr. Doristine Bosworth. Will start patient on direct thrombin inhibitor therapy. Due to renal function issues will choose argatroban at a dose of 30mcg/kg/min for non-critically ill patients based on actual BW per protocol.   Repeat PTT this AM 79 sec (goal 50-90). No bleeding noted INR 2.2, but argatroban falsely elevates the INR.   Goal of Therapy:  APTT 50-90 sec Monitor platelets by anticoagulation protocol: Yes    Plan:  -Continue argatroban at 0.1 mcg/kg/min  -APTT daily -Daily APTT and CBC  Reighlyn Elmes A. Levada Dy, PharmD, BCPS, FNKF Clinical Pharmacist [place] Please utilize Amion for appropriate phone number to reach the unit pharmacist ([place])

## 2019-07-31 NOTE — Plan of Care (Signed)

## 2019-07-31 NOTE — Progress Notes (Signed)
Pharmacy Antibiotic Note  Megan Ortiz is a 43 y.o. female admitted on 07/19/2019 with cellulitis.  Pharmacy has been consulted for cefazolin dosing. Patient with AKI on HD. UOP 569m on 9/16. EGFR<10 ml/min  Patient day #13 of antibiotics and #7 of cefazolin. D/W Dr. PDoristine Bosworth will d/c abx.   Plan: D/C Cefazolin 2gm IV q24h    Height: _0  (165.1 cm) Weight: (!) 612 lb 14.1 oz (278 kg) IBW/kg (Calculated) : 57  Temp (24hrs), Avg:98.2 F (36.8 C), Min:97.3 F (36.3 C), Max:98.6 F (37 C)  Recent Labs  Lab 07/25/19 0930 07/26/19 1031  07/27/19 0342 07/28/19 0402 07/28/19 0703 07/29/19 0732 07/29/19 1029 07/30/19 0301 07/31/19 0315  WBC 16.7* 14.1*  --  15.9*  --  15.2* 16.2* 15.3*  --   --   CREATININE  --   --    < > 7.15* 5.75*  --  6.55*  --  4.45* 5.21*  VANCORANDOM 52*  --   --   --   --   --   --   --   --   --    < > = values in this interval not displayed.    Estimated Creatinine Clearance: 31.6 mL/min (A) (by C-G formula based on SCr of 5.21 mg/dL (H)).    Allergies  Allergen Reactions  . Aspirin     Due to hx of stomach ulcers    Antimicrobials this admission: Vancomycin9/5 >>9/8 Zosyn9/5 >>9/8 Ceftriaxone 9/8>>9/10 Cefazolin 9/11>>9/17    Lilliane Sposito A. PLevada Dy PharmD, BCPS, FNKF Clinical Pharmacist [place] Please utilize Amion for appropriate phone number to reach the unit pharmacist ([place]    07/31/2019 8:08 AM

## 2019-07-31 NOTE — Progress Notes (Signed)
Pt right thigh and leg wound dressing changed this morning, scant old drainage noted, will continue to monitor.

## 2019-07-31 NOTE — Progress Notes (Signed)
PROGRESS NOTE  Megan Ortiz [id number] DOB: 1976-02-14   PCP: Larene Beach, MD  Patient is from: Home  DOA: 07/19/2019 LOS: 67  Brief Narrative / Interim history: Patient is a 43 year old Caucasian female, morbidly obese,asthma, hiatal hernia, gastric ulcer over 10 years ago, history of PE with history of IVC filter and recent RLE DVT on 05/2019 on Coumadin, history of necrotizing fasciitis with debridement of buttock and gluteal wound in 2018.  Patient presented with RLE pain, induration, ecchymosis and tenderness over the medial aspect.  Not able to obtain CT due to body habitus. Ultrasound imaging did not show any underlying fluid collections.    She was a started on vancomycin and Zosyn.  During the course of this hospitalization, patient has developed severe acute kidney injury.  Tunneled hemodialysis catheter has been placed, and patient has had 1 renal replacement therapy.  Likely, patient will be dialyzed again tomorrow.  Nephrology team's input is appreciated.  Patient is currently on heparin drip.  Coumadin is currently on hold.  On presentation, INR range from 8.1 to 9.9.   Assessment & Plan: Skin necrosis right lower extremity: Very challenging.  This could be warfarin induced skin necrosis however she was bridged with heparin along with warfarin and heparin was stopped almost 2 months ago but she developed necrosis only 10 days ago which is not a typical timing.  She started having some blisters so general surgery was reconsulted on 07/29/2019 however they did not recommend any intervention but recommended dressing changes and wound care is doing that. .  Per pharmacy, requiring a lot of heparin and is still not therapeutic.  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  Should ideally also check protein C to make sure she does not have protein C deficiency however she was on Coumadin lately which may alter the results.  Will wait for few days.  Will DC all antibiotics today since she has  received approximately 10 to 15 days of antibiotics now.  Please refer to following images from 07/29/2019.  Please refer to Dr. Kyra Searles note from 07/28/2019 for previous images from 07/19/2019 for comparison purpose.       Right lower extremity DVT:  Diagnosed in 05/2019.   Patient was started on Coumadin.   INR was supra elevated on presentation, 8.1 and peaked at 9.9.   Coumadin is on hold.   INR today is subtherapeutic.  Patient currently on argatroban.  Acute on chronic anemia:  Possibly due to acute blood loss.  Hemoglobin dropped up to 6.6.  Seems to be total 2 unit of PRBC transfusion so far.  Hemoglobin over 7 which is her baseline.  Monitor closely and transfuse if drops less than 7.  AKI: Nephrology team is managing.   This is likely multifactorial (will defer to the nephrology team)  -[facility] placed on 07/25/2019 for renal replacement therapy given..  Further management deferred to nephrology.  Dialysis started on 07/25/2019.  Last dialysis was 07/29/2019.  Still with low urine output but picking up now.  Essential hypertension:  Blood pressure stable.  Monitor closely.  GERD:  -On PPI.    Mood disorder:  Stable. -Continue home medication  Morbid obesity: BMI 104 -Needs lifestyle change to lose weight. -May benefit from bariatric surgery outpatient.  DVT prophylaxis: On argatroban Code Status: Full code Family Communication: No family present at bedside.  Plan of care discussed with patient in detail. Disposition Plan: TBD.  PT OT recommends SNF.  Per case manager, she has no insurance so  that is going to be very challenging.  Per my discussion with nephrology, they would like to keep her in the hospital and watch her off of the dialysis for at least few days. Consultants: Nephrology, vascular surgery   Subjective: Patient seen and examined.  No complaints.  Leg pain is improved.  Objective: Vitals:   07/30/19 2039 07/31/19 0519 07/31/19 0854 07/31/19 1139  BP: (!)  114/58 (!) 100/41 (!) 157/60 (!) 118/52  Pulse: (!) 101 99 98 93  Resp: 20 20 18    Temp: 98.6 F (37 C) 98.3 F (36.8 C) 98.7 F (37.1 C)   TempSrc: Oral Oral Oral   SpO2: 94% 92% 92%   Weight:      Height:        Intake/Output Summary (Last 24 hours) at 07/31/2019 1217 Last data filed at 07/31/2019 0500 Gross per 24 hour  Intake 126.26 ml  Output 500 ml  Net -373.74 ml   Filed Weights   07/26/19 0021 07/29/19 1232 07/29/19 1643  Weight: (!) 282.3 kg (!) 283 kg (!) 278 kg    Examination:  General exam: Appears calm and comfortable, morbidly obese Respiratory system: Clear to auscultation. Respiratory effort normal. Cardiovascular system: S1 & S2 heard, RRR. No JVD, murmurs, rubs, gallops or clicks. No pedal edema. Gastrointestinal system: Abdomen is nondistended, soft and nontender. No organomegaly or masses felt. Normal bowel sounds heard. Central nervous system: Alert and oriented. No focal neurological deficits. Extremities: Symmetric 5 x 5 power. Skin: Right lower extremity has dressing. Psychiatry: Judgement and insight appear normal. Mood & affect appropriate.   Procedures:  9/11-TDC  Microbiology summarized: SARS-CoV-2 negative. Blood cultures negative.  Antimicrobials: Anti-infectives (From admission, onward)   Start     Dose/Rate Route Frequency Ordered Stop   07/25/19 2200  ceFAZolin (ANCEF) IVPB 2g/100 mL premix  Status:  Discontinued     2 g 200 mL/hr over 30 Minutes Intravenous Every 24 hours 07/25/19 1311 07/25/19 1333   07/25/19 2200  ceFAZolin (ANCEF) IVPB 2g/100 mL premix  Status:  Discontinued     2 g 200 mL/hr over 30 Minutes Intravenous Every 24 hours 07/25/19 1334 07/31/19 0813   07/25/19 1345  ceFAZolin (ANCEF) IVPB 2g/100 mL premix  Status:  Discontinued     2 g 200 mL/hr over 30 Minutes Intravenous Every 12 hours 07/25/19 1333 07/25/19 1334   07/25/19 1100  ceFAZolin (ANCEF) IVPB 2g/100 mL premix  Status:  Discontinued     2 g 200 mL/hr  over 30 Minutes Intravenous Every 12 hours 07/25/19 1023 07/25/19 1311   07/22/19 1730  cefTRIAXone (ROCEPHIN) 2 g in sodium chloride 0.9 % 100 mL IVPB  Status:  Discontinued     2 g 200 mL/hr over 30 Minutes Intravenous Every 24 hours 07/22/19 1704 07/24/19 1314   07/20/19 0615  vancomycin (VANCOCIN) 1,500 mg in sodium chloride 0.9 % 500 mL IVPB  Status:  Discontinued     1,500 mg 250 mL/hr over 120 Minutes Intravenous Every 8 hours 07/20/19 0610 07/22/19 0825   07/19/19 2200  Vancomycin (VANCOCIN) 1,500 mg in sodium chloride 0.9 % 500 mL IVPB  Status:  Discontinued     1,500 mg 250 mL/hr over 120 Minutes Intravenous Every 8 hours 07/19/19 1846 07/20/19 0606   07/19/19 2130  piperacillin-tazobactam (ZOSYN) IVPB 3.375 g  Status:  Discontinued     3.375 g 100 mL/hr over 30 Minutes Intravenous  Once 07/19/19 2122 07/19/19 2127   07/19/19 2130  vancomycin (VANCOCIN) IVPB  1000 mg/200 mL premix  Status:  Discontinued     1,000 mg 200 mL/hr over 60 Minutes Intravenous  Once 07/19/19 2122 07/19/19 2127   07/19/19 2130  piperacillin-tazobactam (ZOSYN) IVPB 3.375 g  Status:  Discontinued     3.375 g 12.5 mL/hr over 240 Minutes Intravenous Every 8 hours 07/19/19 1846 07/22/19 1704   07/19/19 1230  vancomycin (VANCOCIN) IVPB 1000 mg/200 mL premix     1,000 mg 200 mL/hr over 60 Minutes Intravenous Every 1 hr x 2 07/19/19 1215 07/19/19 1523   07/19/19 1230  piperacillin-tazobactam (ZOSYN) IVPB 3.375 g     3.375 g 12.5 mL/hr over 240 Minutes Intravenous  Once 07/19/19 1215 07/19/19 1323      Sch Meds:  Scheduled Meds:  Chlorhexidine Gluconate Cloth  6 each Topical Q0600   influenza vac split quadrivalent PF  0.5 mL Intramuscular Tomorrow-1000   montelukast  10 mg Oral Daily   nortriptyline  25 mg Oral QHS   pantoprazole  40 mg Oral BID AC   sertraline  100 mg Oral BID   sevelamer carbonate  1,600 mg Oral TID WC   topiramate  50 mg Oral Daily   Continuous Infusions:  sodium chloride      sodium chloride     argatroban 0.1 mcg/kg/min (07/31/19 0537)   PRN Meds:.sodium chloride, sodium chloride, acetaminophen **OR** acetaminophen, albuterol, alteplase, heparin, ondansetron **OR** ondansetron (ZOFRAN) IV, oxyCODONE, sodium chloride flush   I have personally reviewed the following labs and images: CBC: Recent Labs  Lab 07/26/19 1031  07/27/19 0342 07/27/19 2005 07/28/19 0703 07/28/19 1050 07/29/19 0732 07/29/19 1029  WBC 14.1*  --  15.9*  --  15.2*  --  16.2* 15.3*  NEUTROABS  --   --   --   --  12.8*  --   --  13.2*  HGB 6.6*   < > 7.7* 7.6* 7.2* 7.5* 7.2* 7.4*  HCT 22.5*   < > 26.3* 25.1* 24.3* 24.8* 24.5* 24.3*  MCV 89.3  --  89.8  --  88.7  --  90.1 88.7  PLT 407*  --  486*  --  444*  --  451* 433*   < > = values in this interval not displayed.   BMP &GFR Recent Labs  Lab 07/27/19 0342 07/28/19 0402 07/29/19 0732 07/30/19 0301 07/31/19 0315  NA 136 135 133* 135 135  K 4.7 4.0 4.1 3.4* 3.5  CL 104 101 98 98 98  CO2 17* 19* 19* 23 24  GLUCOSE 96 93 90 92 87  BUN 45* 34* 43* 25* 32*  CREATININE 7.15* 5.75* 6.55* 4.45* 5.21*  CALCIUM 8.3* 8.5* 8.9 8.8* 8.9  MG 1.9  --   --   --   --   PHOS 7.2* 5.5* 5.5* 4.1 4.5   Estimated Creatinine Clearance: 31.6 mL/min (A) (by C-G formula based on SCr of 5.21 mg/dL (H)). Liver & Pancreas: Recent Labs  Lab 07/27/19 0342 07/28/19 0402 07/29/19 0732 07/30/19 0301 07/31/19 0315  AST  --   --   --  9* 9*  ALT  --   --   --  <5 <5  ALKPHOS  --   --   --  98 93  BILITOT  --   --   --  0.7 0.5  PROT  --   --   --  7.2 7.5  ALBUMIN 2.2* 2.0* 1.9* 1.9* 2.0*   No results for input(s): LIPASE, AMYLASE in the last 168  hours. No results for input(s): AMMONIA in the last 168 hours. Diabetic: No results for input(s): HGBA1C in the last 72 hours. No results for input(s): GLUCAP in the last 168 hours. Cardiac Enzymes: Recent Labs  Lab 07/29/19 0732  CKTOTAL <5*   No results for input(s): PROBNP in the last  8760 hours. Coagulation Profile: Recent Labs  Lab 07/27/19 0342 07/28/19 0402 07/29/19 0732 07/30/19 0301 07/31/19 0315  INR 2.0* 1.9* 1.8* 2.8* 2.2*   Thyroid Function Tests: No results for input(s): TSH, T4TOTAL, FREET4, T3FREE, THYROIDAB in the last 72 hours. Lipid Profile: No results for input(s): CHOL, HDL, LDLCALC, TRIG, CHOLHDL, LDLDIRECT in the last 72 hours. Anemia Panel: No results for input(s): VITAMINB12, FOLATE, FERRITIN, TIBC, IRON, RETICCTPCT in the last 72 hours. Urine analysis:    Component Value Date/Time   COLORURINE YELLOW 07/25/2019 0635   APPEARANCEUR TURBID (A) 07/25/2019 0635   LABSPEC 1.019 07/25/2019 0635   PHURINE 5.0 07/25/2019 0635   GLUCOSEU NEGATIVE 07/25/2019 0635   HGBUR MODERATE (A) 07/25/2019 0635   BILIRUBINUR NEGATIVE 07/25/2019 [place] 07/25/2019 0635   PROTEINUR 100 (A) 07/25/2019 0635   UROBILINOGEN 0.2 05/11/2013 1903   NITRITE NEGATIVE 07/25/2019 0635   LEUKOCYTESUR NEGATIVE 07/25/2019 0635   Sepsis Labs: Invalid input(s): PROCALCITONIN, [place]  Microbiology: Recent Results (from the past 240 hour(s))  Surgical pcr screen     Status: None   Collection Time: 07/25/19  2:10 PM   Specimen: Nasal Mucosa; Nasal Swab  Result Value Ref Range Status   MRSA, PCR NEGATIVE NEGATIVE Final   Staphylococcus aureus NEGATIVE NEGATIVE Final    Comment: (NOTE) The Xpert SA Assay (FDA approved for NASAL specimens in patients 40 years of age and older), is one component of a comprehensive surveillance program. It is not intended to diagnose infection nor to guide or monitor treatment. Performed at [facility], [place] [address] [place], [place] [address]     Radiology Studies: No results found.  Ishmael Holter, M.D. [facility]  If 7PM-7AM, please contact night-coverage [email or web address] Password [facility] 07/31/2019, 12:17 PM

## 2019-07-31 NOTE — Plan of Care (Signed)

## 2019-07-31 NOTE — Progress Notes (Signed)
[facility]    NEPHROLOGY PROGRESS NOTE  SUBJECTIVE: 500 mL urine output yesterday, nearly 500 in bag on my eval this am Cr rising some, K OK.     OBJECTIVE:  Vitals:   07/31/19 0519 07/31/19 0854  BP: (!) 100/41 (!) 157/60  Pulse: 99 98  Resp: 20 18  Temp: 98.3 F (36.8 C) 98.7 F (37.1 C)  SpO2: 92% 92%    Intake/Output Summary (Last 24 hours) at 07/31/2019 1014 Last data filed at 07/31/2019 0500 Gross per 24 hour  Intake 126.26 ml  Output 500 ml  Net -373.74 ml      General:  AAOx3 NAD HEENT:edentulous Neck:  No JVD CV:  Heart RRR Lungs:  Clear bilaterally no c/w/r Abd:  abd SNT/ND with normal BS, morbidly obese Extremities:  (+) deeply violaceous lesions right leg (dressed today), (+)2 edema ACCESS: R IJ TDC  MEDICATIONS:  . Chlorhexidine Gluconate Cloth  6 each Topical Q0600  . furosemide  80 mg Intravenous Once  . influenza vac split quadrivalent PF  0.5 mL Intramuscular Tomorrow-1000  . montelukast  10 mg Oral Daily  . nortriptyline  25 mg Oral QHS  . pantoprazole  40 mg Oral BID AC  . potassium chloride  20 mEq Oral Once  . sertraline  100 mg Oral BID  . sevelamer carbonate  1,600 mg Oral TID WC  . topiramate  50 mg Oral Daily       LABS:   CBC Latest Ref Rng & Units 07/29/2019 07/29/2019 07/28/2019  WBC 4.0 - 10.5 K/uL 15.3(H) 16.2(H) -  Hemoglobin 12.0 - 15.0 g/dL 7.4(L) 7.2(L) 7.5(L)  Hematocrit 36.0 - 46.0 % 24.3(L) 24.5(L) 24.8(L)  Platelets 150 - 400 K/uL 433(H) 451(H) -    CMP Latest Ref Rng & Units 07/31/2019 07/30/2019 07/29/2019  Glucose 70 - 99 mg/dL 87 92 90  BUN 6 - 20 mg/dL 32(H) 25(H) 43(H)  Creatinine 0.44 - 1.00 mg/dL 5.21(H) 4.45(H) 6.55(H)  Sodium 135 - 145 mmol/L 135 135 133(L)  Potassium 3.5 - 5.1 mmol/L 3.5 3.4(L) 4.1  Chloride 98 - 111 mmol/L 98 98 98  CO2 22 - 32 mmol/L 24 23 19(L)  Calcium 8.9 - 10.3 mg/dL 8.9 8.8(L) 8.9  Total Protein 6.5 - 8.1 g/dL 7.5 7.2 -  Total Bilirubin 0.3 - 1.2 mg/dL 0.5 0.7 -   Alkaline Phos 38 - 126 U/L 93 98 -  AST 15 - 41 U/L 9(L) 9(L) -  ALT 0 - 44 U/L <5 <5 -    Lab Results  Component Value Date   CALCIUM 8.9 07/31/2019   PHOS 4.5 07/31/2019       Component Value Date/Time   COLORURINE YELLOW 07/25/2019 0635   APPEARANCEUR TURBID (A) 07/25/2019 0635   LABSPEC 1.019 07/25/2019 0635   PHURINE 5.0 07/25/2019 0635   GLUCOSEU NEGATIVE 07/25/2019 0635   HGBUR MODERATE (A) 07/25/2019 0635   BILIRUBINUR NEGATIVE 07/25/2019 0635   KETONESUR NEGATIVE 07/25/2019 0635   PROTEINUR 100 (A) 07/25/2019 0635   UROBILINOGEN 0.2 05/11/2013 1903   NITRITE NEGATIVE 07/25/2019 0635   LEUKOCYTESUR NEGATIVE 07/25/2019 0635      Component Value Date/Time   PHART 7.371 05/30/2019 1655   PCO2ART 33.4 05/30/2019 1655   PO2ART 79.4 (L) 05/30/2019 1655   HCO3 18.9 (L) 05/30/2019 1655   TCO2 21 02/19/2017 0856   ACIDBASEDEF 5.4 (H) 05/30/2019 1655   O2SAT 96.2 05/30/2019 1655       Component Value Date/Time   IRON  10 (L) 07/24/2019 0911   TIBC 211 (L) 07/24/2019 0911   FERRITIN 77 07/24/2019 0911   IRONPCTSAT 5 (L) 07/24/2019 0911       ASSESSMENT/PLAN:    This is a 43 year old lady with history of morbid obesity recent right lower extremity DVT July 2020.  She been taking Coumadin she has a history of necrotizing fasciitis and debridements of the buttock and gluteal wound in 2018.  She presented 07/19/2019 with right lower extremity pain induration ecchymosis and tenderness of the medial aspect of thigh.  Not able to get a CT scan secondary to body habitus.  She appears to have some acute kidney injury and nephrology was consulted.   Acute kidney injury.   [place] without obstruction.  Likely due to ATN / vanco.  Avoid nephrotoxins ACE inhibitor's ARB use nonsteroidal anti-inflammatory drugs.  Avoid IV contrast.  Avoid intra-arterial procedures.  Worsening renal function in the setting of anuria -started HD on Friday, 07/25/2019.  Looks like urine output is picking up a  little bit.  Continue Foley catheter to quantify, Strict I/O.  Will make daily determination for HD--> will HOLD today 9/17 and will try to give IV Lasix 80 mg.    Calciphylaxis/Coumadin necrosis.  Aggressive wound care management.  Will increase sevelamer to manage phosphorus.  On Ancef for superimposed cellulitis.  Morbid obesity  RLE DVT.  Continue anticoag--> on argatroban gtt now  Hyperphosphatemia.  Continue sevelamer.  Anemia.  Status post PRBCs-- iron def but would avoid iron in setting of possible calciphylaxis

## 2019-08-01 DIAGNOSIS — E876 Hypokalemia: Secondary | ICD-10-CM | POA: Diagnosis present

## 2019-08-01 LAB — COMPREHENSIVE METABOLIC PANEL
ALT: <5 U/L (ref 0–44)
AST: 10 U/L — ABNORMAL LOW (ref 15–41)
Albumin: 2 g/dL — ABNORMAL LOW (ref 3.5–5.0)
Alkaline Phosphatase: 90 U/L (ref 38–126)
Anion gap: 14 (ref 5–15)
BUN: 40 mg/dL — ABNORMAL HIGH (ref 6–20)
CO2: 24 mmol/L (ref 22–32)
Calcium: 9.2 mg/dL (ref 8.9–10.3)
Chloride: 98 mmol/L (ref 98–111)
Creatinine, Ser: 5.76 mg/dL — ABNORMAL HIGH (ref 0.44–1.00)
GFR calc Af Amer: 10 mL/min — ABNORMAL LOW (ref >60)
GFR calc non Af Amer: 8 mL/min — ABNORMAL LOW (ref >60)
Glucose, Bld: 103 mg/dL — ABNORMAL HIGH (ref 70–99)
Potassium: 3.6 mmol/L (ref 3.5–5.1)
Sodium: 136 mmol/L (ref 135–145)
Total Bilirubin: 0.4 mg/dL (ref 0.3–1.2)
Total Protein: 7.3 g/dL (ref 6.5–8.1)

## 2019-08-01 LAB — PROTIME-INR
INR: 2.4 — ABNORMAL HIGH (ref 0.8–1.2)
Prothrombin Time: 26 seconds — ABNORMAL HIGH (ref 11.4–15.2)

## 2019-08-01 LAB — APTT: aPTT: 65 seconds — ABNORMAL HIGH (ref 24–36)

## 2019-08-01 LAB — PHOSPHORUS: Phosphorus: 4.8 mg/dL — ABNORMAL HIGH (ref 2.5–4.6)

## 2019-08-01 MED ORDER — WARFARIN SODIUM 5 MG PO TABS
5.0000 mg | ORAL_TABLET | Freq: Once | ORAL | Status: AC
  Administered 2019-08-01: 5 mg via ORAL
  Filled 2019-08-01: qty 1

## 2019-08-01 MED ORDER — WARFARIN - PHARMACIST DOSING INPATIENT
Freq: Every day | Status: DC
  Administered 2019-08-01 – 2019-08-04 (×2)

## 2019-08-01 NOTE — Progress Notes (Signed)
Notified [facility], BP 122/57, automatic.   RN attempted manual BP multiple times with no success.   Nursing will continue to monitor.

## 2019-08-01 NOTE — Progress Notes (Addendum)
PROGRESS NOTE  Megan Ortiz [id number] DOB: [birth year]   PCP: Larene Beach, MD  Patient is from: Home  DOA: 07/19/2019 LOS: 97  Brief Narrative / Interim history: Patient is a 43 year old Caucasian female, morbidly obese,asthma, hiatal hernia, gastric ulcer over 10 years ago, history of PE with history of IVC filter and recent RLE DVT on 05/2019 on Coumadin, history of necrotizing fasciitis with debridement of buttock and gluteal wound in 2018.  Patient presented with RLE pain, induration, ecchymosis and tenderness over the medial aspect.  Not able to obtain CT due to body habitus. Ultrasound imaging did not show any underlying fluid collections.    She was a started on vancomycin and Zosyn.  During the course of this hospitalization, patient has developed severe acute kidney injury.  Tunneled hemodialysis catheter has been placed, and patient has had 1 renal replacement therapy.  Likely, patient will be dialyzed again tomorrow.  Nephrology team's input is appreciated.  Patient is currently on heparin drip.  Coumadin is currently on hold.  On presentation, INR range from 8.1 to 9.9.   Assessment & Plan: Skin necrosis right lower extremity: Very challenging.  This could be warfarin induced skin necrosis however she was bridged with heparin along with warfarin and heparin was stopped almost 2 months ago but she developed necrosis only 10 days ago which is not a typical timing.  She started having some blisters so general surgery was reconsulted on 07/29/2019 however they did not recommend any intervention but recommended dressing changes and wound care is doing that. .  Per pharmacy, requiring a lot of heparin and is still not therapeutic.  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  Will check protein C level.  Discontinued all antibiotics on 07/31/2019 after 12 days of it.  Please refer to following images from 07/29/2019.  Please refer to Dr. Kyra Searles note from 07/28/2019 for previous images from 07/19/2019 for  comparison purpose.       Right lower extremity DVT:  Diagnosed in 05/2019.   Patient was started on Coumadin.   INR was supra elevated on presentation, 8.1 and peaked at 9.9.   Coumadin is on hold.   INR today is subtherapeutic.  Patient currently on argatroban.  Acute on chronic anemia:  Possibly due to acute blood loss.  Hemoglobin dropped up to 6.6.  Seems to be total 2 unit of PRBC transfusion so far.  Hemoglobin over 7 which is her baseline.  Monitor closely and transfuse if drops less than 7.  AKI: Nephrology team is managing.   This is likely multifactorial (will defer to the nephrology team)  -[facility] placed on 07/25/2019 for renal replacement therapy given..  Further management deferred to nephrology.  Dialysis started on 07/25/2019.  Last dialysis was 07/29/2019.  Still with low urine output but picking up now and seems to have 1150 urine output in last 24 hours.  Will be discharged when cleared by nephrology  Essential hypertension:  Blood pressure stable.  Monitor closely.  GERD:  -On PPI.    Mood disorder:  Stable. -Continue home medication  Morbid obesity: BMI 104 -Needs lifestyle change to lose weight. -May benefit from bariatric surgery outpatient.  Hypokalemia: We will replace orally and recheck in the morning.  DVT prophylaxis: On argatroban Code Status: Full code Family Communication: No family present at bedside.  Plan of care discussed with patient in detail. Disposition Plan: TBD.  PT OT recommends SNF.  Per case manager, she has no insurance so that is going to be  very challenging.  Per my discussion with nephrology, they would like to keep her in the hospital and watch her off of the dialysis for at least few days.  Patient mentions that she would prefer to go home with home health. Consultants: Nephrology, vascular surgery   Subjective: Patient seen and examined.  She has no complaints.  I discussed her about possible discharge to SNF once nephrology  clears but she mentioned that in the past when she had necrotizing fasciitis, her husband did a good job on doing the dressing and that he can do dressings again and that she would prefer to go home once ready for discharge.  Objective: Vitals:   07/31/19 1718 07/31/19 2011 08/01/19 0433 08/01/19 0700  BP: (!) 137/59 115/60 (!) 134/48 (!) 125/59  Pulse: 71 90 76 81  Resp:  17 17   Temp: 98.4 F (36.9 C) 98.6 F (37 C) 97.9 F (36.6 C) 98.1 F (36.7 C)  TempSrc: Oral Oral    SpO2: 94% 92% 90% 93%  Weight:      Height:        Intake/Output Summary (Last 24 hours) at 08/01/2019 1045 Last data filed at 08/01/2019 0851 Gross per 24 hour  Intake 1228.19 ml  Output 1150 ml  Net 78.19 ml   Filed Weights   07/26/19 0021 07/29/19 1232 07/29/19 1643  Weight: (!) 282.3 kg (!) 283 kg (!) 278 kg    Examination:  General exam: Appears calm and comfortable, morbidly obese Respiratory system: Clear to auscultation. Respiratory effort normal. Cardiovascular system: S1 & S2 heard, RRR. No JVD, murmurs, rubs, gallops or clicks. No pedal edema. Gastrointestinal system: Abdomen is nondistended, soft and nontender. No organomegaly or masses felt. Normal bowel sounds heard. Central nervous system: Alert and oriented. No focal neurological deficits. Extremities: Symmetric 5 x 5 power. Skin: Dressing in right lower extremity Psychiatry: Judgement and insight appear normal. Mood & affect appropriate.    Procedures:  9/11-TDC  Microbiology summarized: SARS-CoV-2 negative. Blood cultures negative.  Antimicrobials: Anti-infectives (From admission, onward)   Start     Dose/Rate Route Frequency Ordered Stop   07/25/19 2200  ceFAZolin (ANCEF) IVPB 2g/100 mL premix  Status:  Discontinued     2 g 200 mL/hr over 30 Minutes Intravenous Every 24 hours 07/25/19 1311 07/25/19 1333   07/25/19 2200  ceFAZolin (ANCEF) IVPB 2g/100 mL premix  Status:  Discontinued     2 g 200 mL/hr over 30 Minutes  Intravenous Every 24 hours 07/25/19 1334 07/31/19 0813   07/25/19 1345  ceFAZolin (ANCEF) IVPB 2g/100 mL premix  Status:  Discontinued     2 g 200 mL/hr over 30 Minutes Intravenous Every 12 hours 07/25/19 1333 07/25/19 1334   07/25/19 1100  ceFAZolin (ANCEF) IVPB 2g/100 mL premix  Status:  Discontinued     2 g 200 mL/hr over 30 Minutes Intravenous Every 12 hours 07/25/19 1023 07/25/19 1311   07/22/19 1730  cefTRIAXone (ROCEPHIN) 2 g in sodium chloride 0.9 % 100 mL IVPB  Status:  Discontinued     2 g 200 mL/hr over 30 Minutes Intravenous Every 24 hours 07/22/19 1704 07/24/19 1314   07/20/19 0615  vancomycin (VANCOCIN) 1,500 mg in sodium chloride 0.9 % 500 mL IVPB  Status:  Discontinued     1,500 mg 250 mL/hr over 120 Minutes Intravenous Every 8 hours 07/20/19 0610 07/22/19 0825   07/19/19 2200  Vancomycin (VANCOCIN) 1,500 mg in sodium chloride 0.9 % 500 mL IVPB  Status:  Discontinued     1,500 mg 250 mL/hr over 120 Minutes Intravenous Every 8 hours 07/19/19 1846 07/20/19 0606   07/19/19 2130  piperacillin-tazobactam (ZOSYN) IVPB 3.375 g  Status:  Discontinued     3.375 g 100 mL/hr over 30 Minutes Intravenous  Once 07/19/19 2122 07/19/19 2127   07/19/19 2130  vancomycin (VANCOCIN) IVPB 1000 mg/200 mL premix  Status:  Discontinued     1,000 mg 200 mL/hr over 60 Minutes Intravenous  Once 07/19/19 2122 07/19/19 2127   07/19/19 2130  piperacillin-tazobactam (ZOSYN) IVPB 3.375 g  Status:  Discontinued     3.375 g 12.5 mL/hr over 240 Minutes Intravenous Every 8 hours 07/19/19 1846 07/22/19 1704   07/19/19 1230  vancomycin (VANCOCIN) IVPB 1000 mg/200 mL premix     1,000 mg 200 mL/hr over 60 Minutes Intravenous Every 1 hr x 2 07/19/19 1215 07/19/19 1523   07/19/19 1230  piperacillin-tazobactam (ZOSYN) IVPB 3.375 g     3.375 g 12.5 mL/hr over 240 Minutes Intravenous  Once 07/19/19 1215 07/19/19 1323      Sch Meds:  Scheduled Meds: . Chlorhexidine Gluconate Cloth  6 each Topical Q0600  .  influenza vac split quadrivalent PF  0.5 mL Intramuscular Tomorrow-1000  . montelukast  10 mg Oral Daily  . nortriptyline  25 mg Oral QHS  . pantoprazole  40 mg Oral BID AC  . sertraline  100 mg Oral BID  . sevelamer carbonate  1,600 mg Oral TID WC  . topiramate  50 mg Oral Daily   Continuous Infusions: . sodium chloride    . sodium chloride    . argatroban 0.1 mcg/kg/min (07/31/19 1330)   PRN Meds:.sodium chloride, sodium chloride, acetaminophen **OR** acetaminophen, albuterol, alteplase, heparin, ondansetron **OR** ondansetron (ZOFRAN) IV, oxyCODONE, sodium chloride flush   I have personally reviewed the following labs and images: CBC: Recent Labs  Lab 07/26/19 1031  07/27/19 0342 07/27/19 2005 07/28/19 0703 07/28/19 1050 07/29/19 0732 07/29/19 1029  WBC 14.1*  --  15.9*  --  15.2*  --  16.2* 15.3*  NEUTROABS  --   --   --   --  12.8*  --   --  13.2*  HGB 6.6*   < > 7.7* 7.6* 7.2* 7.5* 7.2* 7.4*  HCT 22.5*   < > 26.3* 25.1* 24.3* 24.8* 24.5* 24.3*  MCV 89.3  --  89.8  --  88.7  --  90.1 88.7  PLT 407*  --  486*  --  444*  --  451* 433*   < > = values in this interval not displayed.   BMP &GFR Recent Labs  Lab 07/27/19 0342 07/28/19 0402 07/29/19 0732 07/30/19 0301 07/31/19 0315 08/01/19 0734  NA 136 135 133* 135 135 136  K 4.7 4.0 4.1 3.4* 3.5 3.6  CL 104 101 98 98 98 98  CO2 17* 19* 19* 23 24 24   GLUCOSE 96 93 90 92 87 103*  BUN 45* 34* 43* 25* 32* 40*  CREATININE 7.15* 5.75* 6.55* 4.45* 5.21* 5.76*  CALCIUM 8.3* 8.5* 8.9 8.8* 8.9 9.2  MG 1.9  --   --   --   --   --   PHOS 7.2* 5.5* 5.5* 4.1 4.5 4.8*   Estimated Creatinine Clearance: 28.6 mL/min (A) (by C-G formula based on SCr of 5.76 mg/dL (H)). Liver & Pancreas: Recent Labs  Lab 07/28/19 0402 07/29/19 0732 07/30/19 0301 07/31/19 0315 08/01/19 0734  AST  --   --  9* 9*  10*  ALT  --   --  <5 <5 <5  ALKPHOS  --   --  98 93 90  BILITOT  --   --  0.7 0.5 0.4  PROT  --   --  7.2 7.5 7.3  ALBUMIN  2.0* 1.9* 1.9* 2.0* 2.0*   No results for input(s): LIPASE, AMYLASE in the last 168 hours. No results for input(s): AMMONIA in the last 168 hours. Diabetic: No results for input(s): HGBA1C in the last 72 hours. No results for input(s): GLUCAP in the last 168 hours. Cardiac Enzymes: Recent Labs  Lab 07/29/19 0732  CKTOTAL <5*   No results for input(s): PROBNP in the last 8760 hours. Coagulation Profile: Recent Labs  Lab 07/28/19 0402 07/29/19 0732 07/30/19 0301 07/31/19 0315 08/01/19 0734  INR 1.9* 1.8* 2.8* 2.2* 2.4*   Thyroid Function Tests: No results for input(s): TSH, T4TOTAL, FREET4, T3FREE, THYROIDAB in the last 72 hours. Lipid Profile: No results for input(s): CHOL, HDL, LDLCALC, TRIG, CHOLHDL, LDLDIRECT in the last 72 hours. Anemia Panel: No results for input(s): VITAMINB12, FOLATE, FERRITIN, TIBC, IRON, RETICCTPCT in the last 72 hours. Urine analysis:    Component Value Date/Time   COLORURINE YELLOW 07/25/2019 0635   APPEARANCEUR TURBID (A) 07/25/2019 0635   LABSPEC 1.019 07/25/2019 0635   PHURINE 5.0 07/25/2019 0635   GLUCOSEU NEGATIVE 07/25/2019 0635   HGBUR MODERATE (A) 07/25/2019 0635   BILIRUBINUR NEGATIVE 07/25/2019 [place] 07/25/2019 0635   PROTEINUR 100 (A) 07/25/2019 0635   UROBILINOGEN 0.2 05/11/2013 1903   NITRITE NEGATIVE 07/25/2019 0635   LEUKOCYTESUR NEGATIVE 07/25/2019 0635   Sepsis Labs: Invalid input(s): PROCALCITONIN, [place]  Microbiology: Recent Results (from the past 240 hour(s))  Surgical pcr screen     Status: None   Collection Time: 07/25/19  2:10 PM   Specimen: Nasal Mucosa; Nasal Swab  Result Value Ref Range Status   MRSA, PCR NEGATIVE NEGATIVE Final   Staphylococcus aureus NEGATIVE NEGATIVE Final    Comment: (NOTE) The Xpert SA Assay (FDA approved for NASAL specimens in patients 75 years of age and older), is one component of a comprehensive surveillance program. It is not intended to diagnose  infection nor to guide or monitor treatment. Performed at [facility], [place] [address], [place], [place] [address]     Radiology Studies: No results found.  Ishmael Holter, M.D. [facility]  If 7PM-7AM, please contact night-coverage [email or web address] Password [email or web address] 08/01/2019, 10:45 AM

## 2019-08-01 NOTE — Progress Notes (Signed)
[facility]    NEPHROLOGY PROGRESS NOTE  SUBJECTIVE: Great response to Lasix challenge yesterday,  Cr bumped slightly.  No uremia.    OBJECTIVE:  Vitals:   08/01/19 0433 08/01/19 0700  BP: (!) 134/48 (!) 125/59  Pulse: 76 81  Resp: 17   Temp: 97.9 F (36.6 C) 98.1 F (36.7 C)  SpO2: 90% 93%    Intake/Output Summary (Last 24 hours) at 08/01/2019 1352 Last data filed at 08/01/2019 0851 Gross per 24 hour  Intake 950 ml  Output 1150 ml  Net -200 ml      General:  AAOx3 NAD HEENT:edentulous Neck:  No JVD CV:  Heart RRR Lungs:  Clear bilaterally no c/w/r Abd:  abd SNT/ND with normal BS, morbidly obese Extremities:  (+) deeply violaceous lesions right leg (dressed today), (+)2 edema ACCESS: R IJ TDC  MEDICATIONS:  . Chlorhexidine Gluconate Cloth  6 each Topical Q0600  . influenza vac split quadrivalent PF  0.5 mL Intramuscular Tomorrow-1000  . montelukast  10 mg Oral Daily  . nortriptyline  25 mg Oral QHS  . pantoprazole  40 mg Oral BID AC  . sertraline  100 mg Oral BID  . sevelamer carbonate  1,600 mg Oral TID WC  . topiramate  50 mg Oral Daily  . warfarin  5 mg Oral ONCE-1800  . Warfarin - Pharmacist Dosing Inpatient   Does not apply q1800       LABS:   CBC Latest Ref Rng & Units 07/29/2019 07/29/2019 07/28/2019  WBC 4.0 - 10.5 K/uL 15.3(H) 16.2(H) -  Hemoglobin 12.0 - 15.0 g/dL 7.4(L) 7.2(L) 7.5(L)  Hematocrit 36.0 - 46.0 % 24.3(L) 24.5(L) 24.8(L)  Platelets 150 - 400 K/uL 433(H) 451(H) -    CMP Latest Ref Rng & Units 08/01/2019 07/31/2019 07/30/2019  Glucose 70 - 99 mg/dL 103(H) 87 92  BUN 6 - 20 mg/dL 40(H) 32(H) 25(H)  Creatinine 0.44 - 1.00 mg/dL 5.76(H) 5.21(H) 4.45(H)  Sodium 135 - 145 mmol/L 136 135 135  Potassium 3.5 - 5.1 mmol/L 3.6 3.5 3.4(L)  Chloride 98 - 111 mmol/L 98 98 98  CO2 22 - 32 mmol/L 24 24 23   Calcium 8.9 - 10.3 mg/dL 9.2 8.9 8.8(L)  Total Protein 6.5 - 8.1 g/dL 7.3 7.5 7.2  Total Bilirubin 0.3 - 1.2 mg/dL 0.4 0.5 0.7   Alkaline Phos 38 - 126 U/L 90 93 98  AST 15 - 41 U/L 10(L) 9(L) 9(L)  ALT 0 - 44 U/L <5 <5 <5    Lab Results  Component Value Date   CALCIUM 9.2 08/01/2019   PHOS 4.8 (H) 08/01/2019       Component Value Date/Time   COLORURINE YELLOW 07/25/2019 0635   APPEARANCEUR TURBID (A) 07/25/2019 0635   LABSPEC 1.019 07/25/2019 0635   PHURINE 5.0 07/25/2019 0635   GLUCOSEU NEGATIVE 07/25/2019 0635   HGBUR MODERATE (A) 07/25/2019 0635   BILIRUBINUR NEGATIVE 07/25/2019 0635   KETONESUR NEGATIVE 07/25/2019 0635   PROTEINUR 100 (A) 07/25/2019 0635   UROBILINOGEN 0.2 05/11/2013 1903   NITRITE NEGATIVE 07/25/2019 0635   LEUKOCYTESUR NEGATIVE 07/25/2019 0635      Component Value Date/Time   PHART 7.371 05/30/2019 1655   PCO2ART 33.4 05/30/2019 1655   PO2ART 79.4 (L) 05/30/2019 1655   HCO3 18.9 (L) 05/30/2019 1655   TCO2 21 02/19/2017 0856   ACIDBASEDEF 5.4 (H) 05/30/2019 1655   O2SAT 96.2 05/30/2019 1655       Component Value Date/Time   IRON 10 (L)  07/24/2019 0911   TIBC 211 (L) 07/24/2019 0911   FERRITIN 77 07/24/2019 0911   IRONPCTSAT 5 (L) 07/24/2019 0911       ASSESSMENT/PLAN:    This is a 43 year old lady with history of morbid obesity recent right lower extremity DVT July 2020.  She been taking Coumadin she has a history of necrotizing fasciitis and debridements of the buttock and gluteal wound in 2018.  She presented 07/19/2019 with right lower extremity pain induration ecchymosis and tenderness of the medial aspect of thigh.  Not able to get a CT scan secondary to body habitus.  She appears to have some acute kidney injury and nephrology was consulted.   Acute kidney injury.   [place] without obstruction.  Likely due to ATN / vanco.  Avoid nephrotoxins ACE inhibitor's ARB use nonsteroidal anti-inflammatory drugs.  Avoid IV contrast.  Avoid intra-arterial procedures.  Worsening renal function in the setting of anuria -started HD on Friday, 07/25/2019.  Looks like urine output is  picking up a little bit.  Continue Foley catheter to quantify, Strict I/O.  Will make daily determination for HD--> held yesterday 9/17, good response to IV Lasix, will continue to hold HD and watch for renal recovery.     Calciphylaxis/Coumadin necrosis.  Aggressive wound care management.  Will increase sevelamer to manage phosphorus.  Stopped antibiotics.  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  Noted Coumadin is being restarted today--> would recommend another anticoagulant as there have been case reports of warfarin-induced skin necrosis in AT3 deficiency.  Also contraindicated with calciphylaxis-- although this would be atypical to have calciphylaxis in the absence of ESRD, it can occur.    Morbid obesity   RLE DVT.  Continue anticoag--> on argatroban gtt now   Hyperphosphatemia.  Continue sevelamer.   Anemia.  Status post PRBCs-- iron def but would avoid iron in setting of possible calciphylaxis

## 2019-08-01 NOTE — Progress Notes (Signed)
Pt concerned about taking coumadin as ordered. Did notify MD who spoke to pt in detail about this medication. Pt will let this writer know if she wants to take coumadin now as ordered.

## 2019-08-01 NOTE — Plan of Care (Signed)
  Problem: Safety: Goal: Ability to remain free from injury will improve Outcome: Progressing   

## 2019-08-01 NOTE — TOC Initial Note (Signed)
Transition of Care [facility]) - Initial/Assessment Note    Patient Details  Name: Megan Ortiz MRN: [id number] Date of Birth: 05/15/1976  [facility] CM/SW Contact:    Gelene Mink, [place] Phone Number: 08/01/2019, 1:29 PM  Clinical Narrative:                  CSW met with the patient at bedside. CSW introduced herself and explained her role. The patient declined SNF. She stated that her husband is able to support her at home as well as her father. She stated that she would like to receive home health and have PT/OT/RN would be able to assist her. The patient reports that she has a walker at home and does not need any additional equipment.   The patient reported that her husband did work in the past and they used to have insurance. Now her husband is on disability and is able to receive insurance through his disability benefits. The patient stated that she has tried to apply for disability in the past and was denied. The patient reported that along with her current health issues as well as mental health issues she is not able to hold the job.   The patient is open to having financial counseling come in and assist her with medicaid and disability application.   CSW will continue to follow and assist with disposition.   Expected Discharge Plan: [place] Barriers to Discharge: Continued Medical Work up   Patient Goals and CMS Choice Patient states their goals for this hospitalization and ongoing recovery are:: Pt wants to go home with husband CMS Medicare.gov Compare Post Acute Care list provided to:: Patient Choice offered to / list presented to : Patient  Expected Discharge Plan and Services Expected Discharge Plan: [place] In-house Referral: Clinical Social Work   Post Acute Care Choice: [place] arrangements for the past 2 months: [place] Arranged: RN, PT, OT           Prior Living Arrangements/Services Living arrangements for the past 2 months: Single Family Home Lives with:: Spouse   Do you feel safe going back to the place where you live?: Yes      Need for Family Participation in Patient Care: Yes (Comment) Care giver support system in place?: Yes (comment) Current home services: DME Criminal Activity/Legal Involvement Pertinent to Current Situation/Hospitalization: No - Comment as needed  Activities of Daily Living Home Assistive Devices/Equipment: Walker (specify type) ADL Screening (condition at time of admission) Patient's cognitive ability adequate to safely complete daily activities?: Yes Is the patient deaf or have difficulty hearing?: No Does the patient have difficulty seeing, even when wearing glasses/contacts?: No Does the patient have difficulty concentrating, remembering, or making decisions?: No Patient able to express need for assistance with ADLs?: Yes Does the patient have difficulty dressing or bathing?: No Independently performs ADLs?: Yes (appropriate for developmental age) Does the patient have difficulty walking or climbing stairs?: Yes Weakness of Legs: Both Weakness of Arms/Hands: None  Permission Sought/Granted Permission sought to share information with : Case Manager    Share Information with NAME: Sherran Margolis     Permission granted to share info w Relationship: Spouse     Emotional Assessment Appearance:: Appears stated age Attitude/Demeanor/Rapport: Engaged Affect (typically observed): Calm  Orientation: : Oriented to Self, Oriented to Place, Oriented to  Time, Oriented to Situation Alcohol / Substance Use: Not Applicable Psych Involvement: No (comment)  Admission diagnosis:  Tachypnea [R06.82] Cellulitis of right leg [L03.115] History of DVT of lower extremity [Z86.718] Patient Active Problem List   Diagnosis Date Noted  . Hypokalemia 08/01/2019  . Cellulitis 07/19/2019  . Supratherapeutic  INR 07/19/2019  . GERD (gastroesophageal reflux disease) 07/19/2019  . Encephalopathy   . ARF (acute renal failure) ([place])   . DVT (deep venous thrombosis) ([place])   . Anemia   . Morbidly obese ([place])   . Abnormal CT scan, stomach   . Leg DVT (deep venous thromboembolism), acute, right ([place]) 05/24/2019  . Right leg pain 05/24/2019  . Suspected Pulmonary embolus 05/24/2019  . Community acquired pneumonia 05/24/2019  . Positive D dimer 05/24/2019  . Left lower lobe pneumonia ([place]) 05/24/2019  . Hypotension 05/24/2019  . Benign essential HTN   . Uncomplicated asthma   . Morbid Obesity    . Adjustment disorder with mixed anxiety and depressed mood   . Hyperglycemia   . Leukocytosis   . Acute blood loss anemia   . Acute hypoxemic respiratory failure ([place])   . AKI (acute kidney injury) ([place])   . Septic shock ([place]) 02/16/2017  . Necrotizing soft tissue infection 02/16/2017  . Generalized anxiety disorder 12/22/2014   PCP:  Larene Beach, MD Pharmacy:   [facility], [place] [address] [place] [address] Phone: [phone] Fax: [phone]     Social Determinants of Health (SDOH) Interventions    Readmission Risk Interventions Readmission Risk Prevention Plan 06/03/2019  Transportation Screening Complete  PCP or Specialist Appt within 5-7 Days Complete  Home Care Screening Complete  Medication Review (RN CM) Complete  Some recent data might be hidden

## 2019-08-01 NOTE — Progress Notes (Addendum)
ANTICOAGULATION CONSULT NOTE - Follow Up Consult  Pharmacy Consult for Argatroban>>>warfarin Indication: Recent DVT (05/24/19)  Allergies  Allergen Reactions  . Aspirin     Due to hx of stomach ulcers    Patient Measurements: Height: 5\' 5"  (165.1 cm) Weight: (!) 612 lb 14.1 oz (278 kg) IBW/kg (Calculated) : 57 Heparin Dosing Weight: 125.2 Actual weight recorded 282.3 kg  Vital Signs: Temp: 98.1 F (36.7 C) (09/18 0700) BP: 125/59 (09/18 0700) Pulse Rate: 81 (09/18 0700)  Labs: Recent Labs    07/29/19 1029  07/29/19 1635  07/30/19 0301  07/30/19 1631 07/31/19 0315 07/31/19 0819 08/01/19 0734  HGB 7.4*  --   --   --   --   --   --   --   --   --   HCT 24.3*  --   --   --   --   --   --   --   --   --   PLT 433*  --   --   --   --   --   --   --   --   --   APTT  --    < >  --    < > 105*   < > 79*  --  79* 65*  LABPROT  --   --   --   --  29.0*  --   --  24.3*  --  26.0*  INR  --   --   --   --  2.8*  --   --  2.2*  --  2.4*  HEPARINUNFRC  --   --  <0.10*  --   --   --   --   --   --   --   CREATININE  --   --   --   --  4.45*  --   --  5.21*  --  5.76*   < > = values in this interval not displayed.    Estimated Creatinine Clearance: 28.6 mL/min (A) (by C-G formula based on SCr of 5.76 mg/dL (H)).   Medications:  Scheduled:  . Chlorhexidine Gluconate Cloth  6 each Topical Q0600  . influenza vac split quadrivalent PF  0.5 mL Intramuscular Tomorrow-1000  . montelukast  10 mg Oral Daily  . nortriptyline  25 mg Oral QHS  . pantoprazole  40 mg Oral BID AC  . sertraline  100 mg Oral BID  . sevelamer carbonate  1,600 mg Oral TID WC  . topiramate  50 mg Oral Daily    Assessment: 43 yr old female on Coumadin PTA for hx DVT 05/24/19.  INR 8.1 on admit and Coumadin held. Vitamin K 5 mg PO given on 9/7.  IV heparin begun on 9/8 pm when INR down to 1.7. s/p TDC placement on 9/11. INR today is 1.8. Heparin has been running at 3950 units/hr and heparin level still low this  AM at 0.25. Heparin rate has reached max rate on smart pumps. Patient may be experiencing heparin resistance due to subtherapeutic heparin levels at very high rate of heparin. Antithombin III level drawn which was below the normal range. D/W Dr. Doristine Bosworth. Will start patient on direct thrombin inhibitor therapy. Due to renal function issues will choose argatroban at a dose of 62mcg/kg/min for non-critically ill patients based on actual BW per protocol.   Repeat PTT this AM 65 sec (goal 50-90). No bleeding noted INR 2.4, but argatroban falsely elevates the  INR.   Goal of Therapy:  APTT 50-90 sec Monitor platelets by anticoagulation protocol: Yes    Plan:  -Continue argatroban at 0.1 mcg/kg/min  -Daily APTT and CBC - f/u restart of warfarin therapy  Lamari Youngers A. Levada Dy, PharmD, BCPS, FNKF Clinical Pharmacist [place] Please utilize Amion for appropriate phone number to reach the unit pharmacist ([place])   Addendum:  MD wishes to start warfarin tonight in anticipation of discharge. Will start warfarin at 5mg  tonight (INR >8 on PTA dose 10mg  MWF and 5mg  all other days). Will continue warfarin until INR >4 for 24 hours. Argatroban will need to be held for ~6 hours and the INR rechecked. If still therapeutic (INR 2-3), can d/c argatroban.   Plan: Continue argatroban at 0.1 mcg/kg/min Warfarin 5mg  Po x 1 tonight.  Daily INR, APTT

## 2019-08-02 LAB — RENAL FUNCTION PANEL
Albumin: 1.9 g/dL — ABNORMAL LOW (ref 3.5–5.0)
Anion gap: 14 (ref 5–15)
BUN: 50 mg/dL — ABNORMAL HIGH (ref 6–20)
CO2: 23 mmol/L (ref 22–32)
Calcium: 9.3 mg/dL (ref 8.9–10.3)
Chloride: 97 mmol/L — ABNORMAL LOW (ref 98–111)
Creatinine, Ser: 5.9 mg/dL — ABNORMAL HIGH (ref 0.44–1.00)
GFR calc Af Amer: 9 mL/min — ABNORMAL LOW (ref >60)
GFR calc non Af Amer: 8 mL/min — ABNORMAL LOW (ref >60)
Glucose, Bld: 102 mg/dL — ABNORMAL HIGH (ref 70–99)
Phosphorus: 5.4 mg/dL — ABNORMAL HIGH (ref 2.5–4.6)
Potassium: 3.8 mmol/L (ref 3.5–5.1)
Sodium: 134 mmol/L — ABNORMAL LOW (ref 135–145)

## 2019-08-02 LAB — APTT: aPTT: 82 seconds — ABNORMAL HIGH (ref 24–36)

## 2019-08-02 LAB — PROTIME-INR
INR: 2.6 — ABNORMAL HIGH (ref 0.8–1.2)
Prothrombin Time: 27.1 seconds — ABNORMAL HIGH (ref 11.4–15.2)

## 2019-08-02 MED ORDER — WARFARIN SODIUM 5 MG PO TABS
5.0000 mg | ORAL_TABLET | Freq: Once | ORAL | Status: AC
  Administered 2019-08-02: 5 mg via ORAL
  Filled 2019-08-02: qty 1

## 2019-08-02 NOTE — Progress Notes (Signed)
ANTICOAGULATION CONSULT NOTE - Follow Up Consult  Pharmacy Consult for Argatroban>>>warfarin Indication: Recent DVT (05/24/19)  Allergies  Allergen Reactions  . Aspirin     Due to hx of stomach ulcers    Patient Measurements: Height: 5\' 5"  (165.1 cm) Weight: (!) 612 lb 14.1 oz (278 kg) IBW/kg (Calculated) : 57 Heparin Dosing Weight: 125.2 Actual weight recorded 282.3 kg  Vital Signs: Temp: 98.1 F (36.7 C) (09/19 0801) Temp Source: Oral (09/19 0801) BP: 142/50 (09/19 0801) Pulse Rate: 88 (09/19 0801)  Labs: Recent Labs    07/31/19 0315 07/31/19 0819 08/01/19 0734 08/02/19 0503  APTT  --  79* 65* 82*  LABPROT 24.3*  --  26.0* 27.1*  INR 2.2*  --  2.4* 2.6*  CREATININE 5.21*  --  5.76* 5.90*    Estimated Creatinine Clearance: 27.9 mL/min (A) (by C-G formula based on SCr of 5.9 mg/dL (H)).   Medications:  Scheduled:  . Chlorhexidine Gluconate Cloth  6 each Topical Q0600  . influenza vac split quadrivalent PF  0.5 mL Intramuscular Tomorrow-1000  . montelukast  10 mg Oral Daily  . nortriptyline  25 mg Oral QHS  . pantoprazole  40 mg Oral BID AC  . sertraline  100 mg Oral BID  . sevelamer carbonate  1,600 mg Oral TID WC  . topiramate  50 mg Oral Daily  . Warfarin - Pharmacist Dosing Inpatient   Does not apply q1800    Assessment: 43 yr old female on Coumadin PTA for hx DVT 05/24/19.  INR 8.1 on admit and Coumadin held. Vitamin K 5 mg PO given on 9/7.  IV heparin begun on 9/8 pm when INR down to 1.7. s/p TDC placement on 9/11. INR today is 1.8. Heparin has been running at 3950 units/hr and heparin level still low this AM at 0.25. Heparin rate has reached max rate on smart pumps. Patient may be experiencing heparin resistance due to subtherapeutic heparin levels at very high rate of heparin. Antithombin III level drawn which was below the normal range. D/W Dr. Doristine Bosworth. Will start patient on direct thrombin inhibitor therapy. Due to renal function issues will choose  argatroban at a dose of 48mcg/kg/min for non-critically ill patients based on actual BW per protocol.   MD wishes to start warfarin in anticipation of discharge. Will start warfarin at 5mg  tonight (INR >8 on PTA dose 10mg  MWF and 5mg  all other days). Will continue warfarin until INR >4 for 24 hours. Argatroban will need to be held for ~6 hours and the INR rechecked. If still therapeutic (INR 2-3), can d/c argatroban.    PTT therapeutic,  No bleeding noted INR 2.6, but argatroban falsely elevates the INR.   Goal of Therapy:  APTT 50-90 sec Monitor platelets by anticoagulation protocol: Yes    Plan:  Continue argatroban at 0.1 mcg/kg/min  Daily APTT, INR, CBC Warfarin 5 mg po x 1 dose at 1800 pm  Thank you Anette Guarneri, PharmD Please utilize Amion for appropriate phone number to reach the unit pharmacist ([place])

## 2019-08-02 NOTE — Progress Notes (Signed)
PROGRESS NOTE  Megan Ortiz [id number] DOB: 01/11/1976   PCP: Larene Beach, MD  Patient is from: Home  DOA: 07/19/2019 LOS: 92  Brief Narrative / Interim history: Patient is a 43 year old Caucasian female, morbidly obese,asthma, hiatal hernia, gastric ulcer over 10 years ago, history of PE with history of IVC filter and recent RLE DVT on 05/2019 on Coumadin, history of necrotizing fasciitis with debridement of buttock and gluteal wound in 2018.  Patient presented with RLE pain, induration, ecchymosis and tenderness over the medial aspect.  Not able to obtain CT due to body habitus. Ultrasound imaging did not show any underlying fluid collections.    She was a started on vancomycin and Zosyn.  During the course of this hospitalization, patient has developed severe acute kidney injury.  Tunneled hemodialysis catheter has been placed, and patient has had 1 renal replacement therapy.  Likely, patient will be dialyzed again tomorrow.  Nephrology team's input is appreciated.  Patient is currently on heparin drip.  Coumadin is currently on hold.  On presentation, INR range from 8.1 to 9.9.   Assessment & Plan: Skin necrosis right lower extremity: Very challenging.  This could be warfarin induced skin necrosis however she was bridged with heparin along with warfarin and heparin was stopped almost 2 months ago but she developed necrosis only 10 days ago which is not a typical timing.  She started having some blisters so general surgery was reconsulted on 07/29/2019 however they did not recommend any intervention but recommended dressing changes and wound care is doing that. .  Per pharmacy, requiring a lot of heparin and is still not therapeutic.  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  Protein C level pending.  Discontinued all  antibiotics on 07/31/2019 after 12 days of it.  Please refer to following images from 07/29/2019.  Please refer to Dr. Kyra Searles note from 07/28/2019 for previous images from 07/19/2019 for comparison purpose.       Right lower extremity DVT:  Diagnosed in 05/2019.   Patient was started on Coumadin.   INR was supra elevated on presentation, 8.1 and peaked at 9.9.   Coumadin is on hold.   INR today is subtherapeutic.  Patient currently on argatroban.  Yesterday's note from nephrology noted and concerns about starting on Coumadin also noted.  Unfortunately due to her BMI, patient is not a candidate of DOAC , and if she has real AT3 deficiency then she surely is not a good candidate for heparin products (however we are not sure if AT3 deficiency is real since heparin can reduce the levels 30% from normal even 10 days after stopping whereas we checked her levels right after stopping the heparin), having said that, the only option left for [place] to treat her thromboembolism is Coumadin.  Once again, it is not confirmed whether her skin necrosis/ecchymosis is secondary to calciphylaxis or Coumadin induced skin necrosis since the timing does not match between starting of Coumadin and initiation of those necrosis/ecchymosis.  She will need to be in the hospital for at least 24 hours once her INR becomes therapeutic.  Pharmacy dosing Coumadin.  INR still subtherapeutic.  Acute on chronic anemia:  Possibly due to acute blood loss.  Hemoglobin dropped up to 6.6.  Seems to be total 2 unit of PRBC transfusion so far.  Hemoglobin over  7 which is her baseline.  Monitor closely and transfuse if drops less than 7.  AKI: Nephrology team is managing.   This is likely multifactorial (will defer to the nephrology team)  -[facility] placed on 07/25/2019 for renal replacement therapy given..  Further management deferred to nephrology.  Dialysis started on 07/25/2019.  Last dialysis was 07/29/2019.  Still with low urine output but picking  up now and seems to have 950 urine output in last 24 hours.  Will be discharged when cleared by nephrology  Essential hypertension:  Blood pressure stable.  Monitor closely.  GERD:  -On PPI.    Mood disorder:  Stable. -Continue home medication  Morbid obesity: BMI 104 -Needs lifestyle change to lose weight. -May benefit from bariatric surgery outpatient.  Hypokalemia: Replace.  Will recheck labs in the morning.  DVT prophylaxis: On argatroban Code Status: Full code Family Communication: No family present at bedside.  Plan of care discussed with patient in detail. Disposition Plan: TBD.  PT OT recommends SNF.  Per case manager, she has no insurance so that is going to be very challenging.  Per my discussion with nephrology, they would like to keep her in the hospital and watch her off of the dialysis for at least few days.  Patient mentions that she would prefer to go home with home health.  She will need to remain in the hospital until her INR becomes therapeutic and remains that level for at least 24 hours.  Run high still is subtherapeutic.  I anticipate hospitalization for at least 3 more days. Consultants: Nephrology, vascular surgery   Subjective: Patient seen and examined.  She has no complaints.  She had dressing in the right lower extremity.  Per her, her dressing is being changed every day and that her wounds are looking better.  Objective: Vitals:   08/01/19 1957 08/01/19 2118 08/02/19 0413 08/02/19 0801  BP: (!) 96/49 (!) 122/57 (!) 122/52 (!) 142/50  Pulse: 92  91 88  Resp: 19  19 17   Temp: 98 F (36.7 C)  98.1 F (36.7 C) 98.1 F (36.7 C)  TempSrc: Oral  Oral Oral  SpO2: 92%  93% 96%  Weight:      Height:        Intake/Output Summary (Last 24 hours) at 08/02/2019 1419 Last data filed at 08/02/2019 0800 Gross per 24 hour  Intake 960 ml  Output 950 ml  Net 10 ml   Filed Weights   07/26/19 0021 07/29/19 1232 07/29/19 1643  Weight: (!) 282.3 kg (!) 283 kg  (!) 278 kg    Examination:  General exam: Appears calm and comfortable, morbidly obese Respiratory system: Clear to auscultation. Respiratory effort normal. Cardiovascular system: S1 & S2 heard, RRR. No JVD, murmurs, rubs, gallops or clicks. No pedal edema. Gastrointestinal system: Abdomen is nondistended, soft and nontender. No organomegaly or masses felt. Normal bowel sounds heard. Central nervous system: Alert and oriented. No focal neurological deficits. Extremities: Symmetric 5 x 5 power. Skin: Dressing in right lower extremity Psychiatry: Judgement and insight appear normal. Mood & affect appropriate.   Procedures:  9/11-TDC  Microbiology summarized: SARS-CoV-2 negative. Blood cultures negative.  Antimicrobials: Anti-infectives (From admission, onward)   Start     Dose/Rate Route Frequency Ordered Stop   07/25/19 2200  ceFAZolin (ANCEF) IVPB 2g/100 mL premix  Status:  Discontinued     2 g 200 mL/hr over 30 Minutes Intravenous Every 24 hours 07/25/19 1311 07/25/19 1333   07/25/19 2200  ceFAZolin (  ANCEF) IVPB 2g/100 mL premix  Status:  Discontinued     2 g 200 mL/hr over 30 Minutes Intravenous Every 24 hours 07/25/19 1334 07/31/19 0813   07/25/19 1345  ceFAZolin (ANCEF) IVPB 2g/100 mL premix  Status:  Discontinued     2 g 200 mL/hr over 30 Minutes Intravenous Every 12 hours 07/25/19 1333 07/25/19 1334   07/25/19 1100  ceFAZolin (ANCEF) IVPB 2g/100 mL premix  Status:  Discontinued     2 g 200 mL/hr over 30 Minutes Intravenous Every 12 hours 07/25/19 1023 07/25/19 1311   07/22/19 1730  cefTRIAXone (ROCEPHIN) 2 g in sodium chloride 0.9 % 100 mL IVPB  Status:  Discontinued     2 g 200 mL/hr over 30 Minutes Intravenous Every 24 hours 07/22/19 1704 07/24/19 1314   07/20/19 0615  vancomycin (VANCOCIN) 1,500 mg in sodium chloride 0.9 % 500 mL IVPB  Status:  Discontinued     1,500 mg 250 mL/hr over 120 Minutes Intravenous Every 8 hours 07/20/19 0610 07/22/19 0825   07/19/19 2200   Vancomycin (VANCOCIN) 1,500 mg in sodium chloride 0.9 % 500 mL IVPB  Status:  Discontinued     1,500 mg 250 mL/hr over 120 Minutes Intravenous Every 8 hours 07/19/19 1846 07/20/19 0606   07/19/19 2130  piperacillin-tazobactam (ZOSYN) IVPB 3.375 g  Status:  Discontinued     3.375 g 100 mL/hr over 30 Minutes Intravenous  Once 07/19/19 2122 07/19/19 2127   07/19/19 2130  vancomycin (VANCOCIN) IVPB 1000 mg/200 mL premix  Status:  Discontinued     1,000 mg 200 mL/hr over 60 Minutes Intravenous  Once 07/19/19 2122 07/19/19 2127   07/19/19 2130  piperacillin-tazobactam (ZOSYN) IVPB 3.375 g  Status:  Discontinued     3.375 g 12.5 mL/hr over 240 Minutes Intravenous Every 8 hours 07/19/19 1846 07/22/19 1704   07/19/19 1230  vancomycin (VANCOCIN) IVPB 1000 mg/200 mL premix     1,000 mg 200 mL/hr over 60 Minutes Intravenous Every 1 hr x 2 07/19/19 1215 07/19/19 1523   07/19/19 1230  piperacillin-tazobactam (ZOSYN) IVPB 3.375 g     3.375 g 12.5 mL/hr over 240 Minutes Intravenous  Once 07/19/19 1215 07/19/19 1323      Sch Meds:  Scheduled Meds:  Chlorhexidine Gluconate Cloth  6 each Topical Q0600   influenza vac split quadrivalent PF  0.5 mL Intramuscular Tomorrow-1000   montelukast  10 mg Oral Daily   nortriptyline  25 mg Oral QHS   pantoprazole  40 mg Oral BID AC   sertraline  100 mg Oral BID   sevelamer carbonate  1,600 mg Oral TID WC   topiramate  50 mg Oral Daily   warfarin  5 mg Oral ONCE-1800   Warfarin - Pharmacist Dosing Inpatient   Does not apply q1800   Continuous Infusions:  sodium chloride     sodium chloride     argatroban 0.1 mcg/kg/min (08/01/19 1714)   PRN Meds:.sodium chloride, sodium chloride, acetaminophen **OR** acetaminophen, albuterol, alteplase, heparin, ondansetron **OR** ondansetron (ZOFRAN) IV, oxyCODONE, sodium chloride flush   I have personally reviewed the following labs and images: CBC: Recent Labs  Lab 07/27/19 0342 07/27/19 2005  07/28/19 0703 07/28/19 1050 07/29/19 0732 07/29/19 1029  WBC 15.9*  --  15.2*  --  16.2* 15.3*  NEUTROABS  --   --  12.8*  --   --  13.2*  HGB 7.7* 7.6* 7.2* 7.5* 7.2* 7.4*  HCT 26.3* 25.1* 24.3* 24.8* 24.5* 24.3*  MCV 89.8  --  88.7  --  90.1 88.7  PLT 486*  --  444*  --  451* 433*   BMP &GFR Recent Labs  Lab 07/27/19 0342  07/29/19 0732 07/30/19 0301 07/31/19 0315 08/01/19 0734 08/02/19 0503  NA 136   < > 133* 135 135 136 134*  K 4.7   < > 4.1 3.4* 3.5 3.6 3.8  CL 104   < > 98 98 98 98 97*  CO2 17*   < > 19* 23 24 24 23   GLUCOSE 96   < > 90 92 87 103* 102*  BUN 45*   < > 43* 25* 32* 40* 50*  CREATININE 7.15*   < > 6.55* 4.45* 5.21* 5.76* 5.90*  CALCIUM 8.3*   < > 8.9 8.8* 8.9 9.2 9.3  MG 1.9  --   --   --   --   --   --   PHOS 7.2*   < > 5.5* 4.1 4.5 4.8* 5.4*   < > = values in this interval not displayed.   Estimated Creatinine Clearance: 27.9 mL/min (A) (by C-G formula based on SCr of 5.9 mg/dL (H)). Liver & Pancreas: Recent Labs  Lab 07/29/19 0732 07/30/19 0301 07/31/19 0315 08/01/19 0734 08/02/19 0503  AST  --  9* 9* 10*  --   ALT  --  <5 <5 <5  --   ALKPHOS  --  98 93 90  --   BILITOT  --  0.7 0.5 0.4  --   PROT  --  7.2 7.5 7.3  --   ALBUMIN 1.9* 1.9* 2.0* 2.0* 1.9*   No results for input(s): LIPASE, AMYLASE in the last 168 hours. No results for input(s): AMMONIA in the last 168 hours. Diabetic: No results for input(s): HGBA1C in the last 72 hours. No results for input(s): GLUCAP in the last 168 hours. Cardiac Enzymes: Recent Labs  Lab 07/29/19 0732  CKTOTAL <5*   No results for input(s): PROBNP in the last 8760 hours. Coagulation Profile: Recent Labs  Lab 07/29/19 0732 07/30/19 0301 07/31/19 0315 08/01/19 0734 08/02/19 0503  INR 1.8* 2.8* 2.2* 2.4* 2.6*   Thyroid Function Tests: No results for input(s): TSH, T4TOTAL, FREET4, T3FREE, THYROIDAB in the last 72 hours. Lipid Profile: No results for input(s): CHOL, HDL, LDLCALC, TRIG,  CHOLHDL, LDLDIRECT in the last 72 hours. Anemia Panel: No results for input(s): VITAMINB12, FOLATE, FERRITIN, TIBC, IRON, RETICCTPCT in the last 72 hours. Urine analysis:    Component Value Date/Time   COLORURINE YELLOW 07/25/2019 0635   APPEARANCEUR TURBID (A) 07/25/2019 0635   LABSPEC 1.019 07/25/2019 0635   PHURINE 5.0 07/25/2019 0635   GLUCOSEU NEGATIVE 07/25/2019 0635   HGBUR MODERATE (A) 07/25/2019 0635   BILIRUBINUR NEGATIVE 07/25/2019 [place] 07/25/2019 0635   PROTEINUR 100 (A) 07/25/2019 0635   UROBILINOGEN 0.2 05/11/2013 1903   NITRITE NEGATIVE 07/25/2019 0635   LEUKOCYTESUR NEGATIVE 07/25/2019 0635   Sepsis Labs: Invalid input(s): PROCALCITONIN, [place]  Microbiology: Recent Results (from the past 240 hour(s))  Surgical pcr screen     Status: None   Collection Time: 07/25/19  2:10 PM   Specimen: Nasal Mucosa; Nasal Swab  Result Value Ref Range Status   MRSA, PCR NEGATIVE NEGATIVE Final   Staphylococcus aureus NEGATIVE NEGATIVE Final    Comment: (NOTE) The Xpert SA Assay (FDA approved for NASAL specimens in patients 62 years of age and older), is one component of a comprehensive surveillance program. It is not intended to  diagnose infection nor to guide or monitor treatment. Performed at [facility], [place] [address], [place],  [address]     Radiology Studies: No results found.  Ishmael Holter, M.D. [facility]  If 7PM-7AM, please contact night-coverage [email or web address] Password [email or web address] 08/02/2019, 2:19 PM

## 2019-08-02 NOTE — Progress Notes (Signed)
[facility]    NEPHROLOGY PROGRESS NOTE  SUBJECTIVE: UOP good off Lasix, Cr looks like it's going to plateau, no uremia.    OBJECTIVE:  Vitals:   08/02/19 0413 08/02/19 0801  BP: (!) 122/52 (!) 142/50  Pulse: 91 88  Resp: 19 17  Temp: 98.1 F (36.7 C) 98.1 F (36.7 C)  SpO2: 93% 96%    Intake/Output Summary (Last 24 hours) at 08/02/2019 1141 Last data filed at 08/02/2019 0300 Gross per 24 hour  Intake 720 ml  Output 950 ml  Net -230 ml      General:  AAOx3 NAD HEENT:edentulous Neck:  No JVD CV:  Heart RRR Lungs:  Clear bilaterally no c/w/r Abd:  abd SNT/ND with normal BS, morbidly obese Extremities:  (+) deeply violaceous lesions right leg (dressed today), (+)2 edema ACCESS: R IJ TDC  MEDICATIONS:  . Chlorhexidine Gluconate Cloth  6 each Topical Q0600  . influenza vac split quadrivalent PF  0.5 mL Intramuscular Tomorrow-1000  . montelukast  10 mg Oral Daily  . nortriptyline  25 mg Oral QHS  . pantoprazole  40 mg Oral BID AC  . sertraline  100 mg Oral BID  . sevelamer carbonate  1,600 mg Oral TID WC  . topiramate  50 mg Oral Daily  . warfarin  5 mg Oral ONCE-1800  . Warfarin - Pharmacist Dosing Inpatient   Does not apply q1800       LABS:   CBC Latest Ref Rng & Units 07/29/2019 07/29/2019 07/28/2019  WBC 4.0 - 10.5 K/uL 15.3(H) 16.2(H) -  Hemoglobin 12.0 - 15.0 g/dL 7.4(L) 7.2(L) 7.5(L)  Hematocrit 36.0 - 46.0 % 24.3(L) 24.5(L) 24.8(L)  Platelets 150 - 400 K/uL 433(H) 451(H) -    CMP Latest Ref Rng & Units 08/02/2019 08/01/2019 07/31/2019  Glucose 70 - 99 mg/dL 102(H) 103(H) 87  BUN 6 - 20 mg/dL 50(H) 40(H) 32(H)  Creatinine 0.44 - 1.00 mg/dL 5.90(H) 5.76(H) 5.21(H)  Sodium 135 - 145 mmol/L 134(L) 136 135  Potassium 3.5 - 5.1 mmol/L 3.8 3.6 3.5  Chloride 98 - 111 mmol/L 97(L) 98 98  CO2 22 - 32 mmol/L 23 24 24   Calcium 8.9 - 10.3 mg/dL 9.3 9.2 8.9  Total Protein 6.5 - 8.1 g/dL - 7.3 7.5  Total Bilirubin 0.3 - 1.2 mg/dL - 0.4 0.5  Alkaline  Phos 38 - 126 U/L - 90 93  AST 15 - 41 U/L - 10(L) 9(L)  ALT 0 - 44 U/L - <5 <5    Lab Results  Component Value Date   CALCIUM 9.3 08/02/2019   PHOS 5.4 (H) 08/02/2019       Component Value Date/Time   COLORURINE YELLOW 07/25/2019 0635   APPEARANCEUR TURBID (A) 07/25/2019 0635   LABSPEC 1.019 07/25/2019 0635   PHURINE 5.0 07/25/2019 0635   GLUCOSEU NEGATIVE 07/25/2019 0635   HGBUR MODERATE (A) 07/25/2019 0635   BILIRUBINUR NEGATIVE 07/25/2019 0635   KETONESUR NEGATIVE 07/25/2019 0635   PROTEINUR 100 (A) 07/25/2019 0635   UROBILINOGEN 0.2 05/11/2013 1903   NITRITE NEGATIVE 07/25/2019 0635   LEUKOCYTESUR NEGATIVE 07/25/2019 0635      Component Value Date/Time   PHART 7.371 05/30/2019 1655   PCO2ART 33.4 05/30/2019 1655   PO2ART 79.4 (L) 05/30/2019 1655   HCO3 18.9 (L) 05/30/2019 1655   TCO2 21 02/19/2017 0856   ACIDBASEDEF 5.4 (H) 05/30/2019 1655   O2SAT 96.2 05/30/2019 1655       Component Value Date/Time   IRON 10 (L)  07/24/2019 0911   TIBC 211 (L) 07/24/2019 0911   FERRITIN 77 07/24/2019 0911   IRONPCTSAT 5 (L) 07/24/2019 0911       ASSESSMENT/PLAN:    This is a 43 year old lady with history of morbid obesity recent right lower extremity DVT July 2020.  She been taking Coumadin she has a history of necrotizing fasciitis and debridements of the buttock and gluteal wound in 2018.  She presented 07/19/2019 with right lower extremity pain induration ecchymosis and tenderness of the medial aspect of thigh.  Not able to get a CT scan secondary to body habitus.  She appears to have some acute kidney injury and nephrology was consulted.  Acute kidney injury.   [place] without obstruction.  Likely due to ATN / vanco.  Avoid nephrotoxins ACE inhibitor's ARB use nonsteroidal anti-inflammatory drugs.  Avoid IV contrast.  Avoid intra-arterial procedures.  Worsening renal function in the setting of anuria -started HD on Friday, 07/25/2019.  Looks like urine output is picking up a little bit.   Continue Foley catheter to quantify, Strict I/O.  Will make daily determination for HD--> held 9/17, good response to IV Lasix, continue to hold HD and watch ro renal regovery   Calciphylaxis/Coumadin necrosis.  Aggressive wound care management.  Will increase sevelamer to manage phosphorus.  Stopped antibiotics.  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   Coumadin is being restarted --> there have been case reports of warfarin-induced skin necrosis in AT3 deficiency.  Also contraindicated with calciphylaxis-- although this would be atypical to have calciphylaxis in the absence of ESRD, it can occur. However, body mass makes her not a candidate for any DOACs.     Morbid obesity   RLE DVT.  Continue anticoag--> on argatroban gtt now   Hyperphosphatemia.  Continue sevelamer.   Anemia.  Status post PRBCs-- iron def but would avoid iron in setting of possible calciphylaxis

## 2019-08-03 LAB — RENAL FUNCTION PANEL
Albumin: 1.8 g/dL — ABNORMAL LOW (ref 3.5–5.0)
Anion gap: 14 (ref 5–15)
BUN: 55 mg/dL — ABNORMAL HIGH (ref 6–20)
CO2: 24 mmol/L (ref 22–32)
Calcium: 9.2 mg/dL (ref 8.9–10.3)
Chloride: 97 mmol/L — ABNORMAL LOW (ref 98–111)
Creatinine, Ser: 5.58 mg/dL — ABNORMAL HIGH (ref 0.44–1.00)
GFR calc Af Amer: 10 mL/min — ABNORMAL LOW (ref >60)
GFR calc non Af Amer: 9 mL/min — ABNORMAL LOW (ref >60)
Glucose, Bld: 89 mg/dL (ref 70–99)
Phosphorus: 5.4 mg/dL — ABNORMAL HIGH (ref 2.5–4.6)
Potassium: 4.2 mmol/L (ref 3.5–5.1)
Sodium: 135 mmol/L (ref 135–145)

## 2019-08-03 LAB — APTT
aPTT: 95 seconds — ABNORMAL HIGH (ref 24–36)
aPTT: 67 seconds — ABNORMAL HIGH (ref 24–36)

## 2019-08-03 LAB — PROTIME-INR
INR: 3.2 — ABNORMAL HIGH (ref 0.8–1.2)
Prothrombin Time: 32.6 seconds — ABNORMAL HIGH (ref 11.4–15.2)

## 2019-08-03 LAB — GLUCOSE, CAPILLARY: Glucose-Capillary: 85 mg/dL (ref 70–99)

## 2019-08-03 MED ORDER — WARFARIN SODIUM 5 MG PO TABS
5.0000 mg | ORAL_TABLET | Freq: Once | ORAL | Status: AC
  Administered 2019-08-03: 5 mg via ORAL
  Filled 2019-08-03: qty 1

## 2019-08-03 MED ORDER — FUROSEMIDE 10 MG/ML IJ SOLN
40.0000 mg | Freq: Once | INTRAMUSCULAR | Status: AC
  Administered 2019-08-03: 18:00:00 40 mg via INTRAVENOUS
  Filled 2019-08-03: qty 4

## 2019-08-03 NOTE — Progress Notes (Signed)
[facility]    NEPHROLOGY PROGRESS NOTE  SUBJECTIVE: Cr slightly downtrending, good urine output.  BP OK.  Think she may recover.    OBJECTIVE:  Vitals:   08/03/19 0540 08/03/19 1013  BP: (!) 113/58 (!) 95/45  Pulse: 86 84  Resp:  17  Temp: 97.8 F (36.6 C) (!) 97.5 F (36.4 C)  SpO2: (!) 88% 92%    Intake/Output Summary (Last 24 hours) at 08/03/2019 1224 Last data filed at 08/03/2019 0817 Gross per 24 hour  Intake 360 ml  Output 700 ml  Net -340 ml      General:  AAOx3 NAD HEENT:edentulous Neck:  No JVD CV:  Heart RRR Lungs:  Clear bilaterally no c/w/r Abd:  abd SNT/ND with normal BS, morbidly obese Extremities:  (+) deeply violaceous lesions right leg (dressed today), (+)2 edema ACCESS: R IJ TDC  MEDICATIONS:  . Chlorhexidine Gluconate Cloth  6 each Topical Q0600  . influenza vac split quadrivalent PF  0.5 mL Intramuscular Tomorrow-1000  . montelukast  10 mg Oral Daily  . nortriptyline  25 mg Oral QHS  . pantoprazole  40 mg Oral BID AC  . sertraline  100 mg Oral BID  . sevelamer carbonate  1,600 mg Oral TID WC  . topiramate  50 mg Oral Daily  . warfarin  5 mg Oral ONCE-1800  . Warfarin - Pharmacist Dosing Inpatient   Does not apply q1800       LABS:   CBC Latest Ref Rng & Units 07/29/2019 07/29/2019 07/28/2019  WBC 4.0 - 10.5 K/uL 15.3(H) 16.2(H) -  Hemoglobin 12.0 - 15.0 g/dL 7.4(L) 7.2(L) 7.5(L)  Hematocrit 36.0 - 46.0 % 24.3(L) 24.5(L) 24.8(L)  Platelets 150 - 400 K/uL 433(H) 451(H) -    CMP Latest Ref Rng & Units 08/03/2019 08/02/2019 08/01/2019  Glucose 70 - 99 mg/dL 89 102(H) 103(H)  BUN 6 - 20 mg/dL 55(H) 50(H) 40(H)  Creatinine 0.44 - 1.00 mg/dL 5.58(H) 5.90(H) 5.76(H)  Sodium 135 - 145 mmol/L 135 134(L) 136  Potassium 3.5 - 5.1 mmol/L 4.2 3.8 3.6  Chloride 98 - 111 mmol/L 97(L) 97(L) 98  CO2 22 - 32 mmol/L 24 23 24   Calcium 8.9 - 10.3 mg/dL 9.2 9.3 9.2  Total Protein 6.5 - 8.1 g/dL - - 7.3  Total Bilirubin 0.3 - 1.2 mg/dL - -  0.4  Alkaline Phos 38 - 126 U/L - - 90  AST 15 - 41 U/L - - 10(L)  ALT 0 - 44 U/L - - <5    Lab Results  Component Value Date   CALCIUM 9.2 08/03/2019   PHOS 5.4 (H) 08/03/2019       Component Value Date/Time   COLORURINE YELLOW 07/25/2019 0635   APPEARANCEUR TURBID (A) 07/25/2019 0635   LABSPEC 1.019 07/25/2019 0635   PHURINE 5.0 07/25/2019 0635   GLUCOSEU NEGATIVE 07/25/2019 0635   HGBUR MODERATE (A) 07/25/2019 0635   BILIRUBINUR NEGATIVE 07/25/2019 0635   KETONESUR NEGATIVE 07/25/2019 0635   PROTEINUR 100 (A) 07/25/2019 0635   UROBILINOGEN 0.2 05/11/2013 1903   NITRITE NEGATIVE 07/25/2019 0635   LEUKOCYTESUR NEGATIVE 07/25/2019 0635      Component Value Date/Time   PHART 7.371 05/30/2019 1655   PCO2ART 33.4 05/30/2019 1655   PO2ART 79.4 (L) 05/30/2019 1655   HCO3 18.9 (L) 05/30/2019 1655   TCO2 21 02/19/2017 0856   ACIDBASEDEF 5.4 (H) 05/30/2019 1655   O2SAT 96.2 05/30/2019 1655       Component Value Date/Time  IRON 10 (L) 07/24/2019 0911   TIBC 211 (L) 07/24/2019 0911   FERRITIN 77 07/24/2019 0911   IRONPCTSAT 5 (L) 07/24/2019 0911       ASSESSMENT/PLAN:    This is a 43 year old lady with history of morbid obesity recent right lower extremity DVT July 2020.  She been taking Coumadin she has a history of necrotizing fasciitis and debridements of the buttock and gluteal wound in 2018.  She presented 07/19/2019 with right lower extremity pain induration ecchymosis and tenderness of the medial aspect of thigh.  Not able to get a CT scan secondary to body habitus.  She appears to have some acute kidney injury and nephrology was consulted.  Acute kidney injury.   [place] without obstruction.  Likely due to ATN / vanco.  Avoid nephrotoxins ACE inhibitor's ARB use nonsteroidal anti-inflammatory drugs.  Avoid IV contrast.  Avoid intra-arterial procedures.  Worsening renal function in the setting of anuria -started HD on Friday, 07/25/2019.  Looks like urine output is picking up a  little bit.  Continue Foley catheter to quantify, Strict I/O.  Will make daily determination for HDLast HD 9/15--> held 9/17, good response to IV Lasix, continue to hold HD and watch ro renal recovery.  If Cr continues to downtrend tomorrow could get [facility] out.     Calciphylaxis/Coumadin necrosis.  Aggressive wound care management.  Will increase sevelamer to manage phosphorus.  Stopped antibiotics.  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   Coumadin is being restarted --> there have been case reports of warfarin-induced skin necrosis in AT3 deficiency.  Also contraindicated with calciphylaxis-- although this would be atypical to have calciphylaxis in the absence of ESRD, it can occur. However, body mass makes her not a candidate for any DOACs.     Morbid obesity   RLE DVT.  Continue anticoag--> on argatroban gtt now   Hyperphosphatemia.  Continue sevelamer.   Anemia.  Status post PRBCs-- iron def but would avoid iron in setting of possible calciphylaxis

## 2019-08-03 NOTE — Plan of Care (Signed)

## 2019-08-03 NOTE — Progress Notes (Signed)
ANTICOAGULATION CONSULT NOTE - Follow Up Consult  Pharmacy Consult for Argatroban>>>warfarin Indication: Recent DVT (05/24/19)  Allergies  Allergen Reactions  . Aspirin     Due to hx of stomach ulcers    Patient Measurements: Height: 5\' 5"  (165.1 cm) Weight: (!) 612 lb 14.1 oz (278 kg) IBW/kg (Calculated) : 57 Heparin Dosing Weight: 125.2 Actual weight recorded 282.3 kg  Vital Signs: Temp: 97.8 F (36.6 C) (09/20 0540) Temp Source: Oral (09/20 0540) BP: 113/58 (09/20 0540) Pulse Rate: 86 (09/20 0540)  Labs: Recent Labs    08/01/19 0734 08/02/19 0503 08/03/19 0533  APTT 65* 82* 95*  LABPROT 26.0* 27.1* 32.6*  INR 2.4* 2.6* 3.2*  CREATININE 5.76* 5.90* 5.58*    Estimated Creatinine Clearance: 29.5 mL/min (A) (by C-G formula based on SCr of 5.58 mg/dL (H)).   Medications:  Scheduled:  . Chlorhexidine Gluconate Cloth  6 each Topical Q0600  . influenza vac split quadrivalent PF  0.5 mL Intramuscular Tomorrow-1000  . montelukast  10 mg Oral Daily  . nortriptyline  25 mg Oral QHS  . pantoprazole  40 mg Oral BID AC  . sertraline  100 mg Oral BID  . sevelamer carbonate  1,600 mg Oral TID WC  . topiramate  50 mg Oral Daily  . Warfarin - Pharmacist Dosing Inpatient   Does not apply q1800    Assessment: 43 yr old female on Coumadin PTA for hx DVT 05/24/19.  INR 8.1 on admit and Coumadin held. Vitamin K 5 mg PO given on 9/7.  IV heparin begun on 9/8 pm when INR down to 1.7. s/p TDC placement on 9/11. INR today is 1.8. Heparin has been running at 3950 units/hr and heparin level still low this AM at 0.25. Heparin rate has reached max rate on smart pumps. Patient may be experiencing heparin resistance due to subtherapeutic heparin levels at very high rate of heparin. Antithombin III level drawn which was below the normal range. D/W Dr. Doristine Bosworth. Will start patient on direct thrombin inhibitor therapy. Due to renal function issues will choose argatroban at a dose of 73mcg/kg/min  for non-critically ill patients based on actual BW per protocol.   MD wishes to start warfarin in anticipation of discharge. Will start warfarin at 5mg  tonight (INR >8 on PTA dose 10mg  MWF and 5mg  all other days).   Will continue warfarin until INR >4 for 24 hours. Argatroban will need to be held for ~6 hours and the INR rechecked. If still therapeutic (INR 2-3), can d/c argatroban.    PTT elevated at 95 seconds,  No bleeding noted INR 3.2, but argatroban falsely elevates the INR.   Goal of Therapy:  APTT 50-90 sec Monitor platelets by anticoagulation protocol: Yes    Plan:  Decrease Argatroban to 0.08 mcg/kg/min PTT in 2 hours Daily APTT, INR, CBC Repeat Warfarin 5 mg po x 1 dose at 1800 pm  Thank you Megan Ortiz, PharmD Please utilize Amion for appropriate phone number to reach the unit pharmacist ([place])

## 2019-08-03 NOTE — Progress Notes (Signed)
PROGRESS NOTE  Megan Ortiz [id number] DOB: 1976-07-16   PCP: Larene Beach, MD  Patient is from: Home  DOA: 07/19/2019 LOS: 48  Brief Narrative / Interim history: Patient is a 43 year old Caucasian female, morbidly obese,asthma, hiatal hernia, gastric ulcer over 10 years ago, history of PE with history of IVC filter and recent RLE DVT on 05/2019 on Coumadin, history of necrotizing fasciitis with debridement of buttock and gluteal wound in 2018.  Patient presented with RLE pain, induration, ecchymosis and tenderness over the medial aspect.  Not able to obtain CT due to body habitus. Ultrasound imaging did not show any underlying fluid collections.    She was a started on vancomycin and Zosyn.  During the course of this hospitalization, patient has developed severe acute kidney injury.  Tunneled hemodialysis catheter has been placed, and patient has had 1 renal replacement therapy.  Likely, patient will be dialyzed again tomorrow.  Nephrology team's input is appreciated.  Patient is currently on heparin drip.  Coumadin is currently on hold.  On presentation, INR range from 8.1 to 9.9.   Assessment & Plan: Skin necrosis right lower extremity: Very challenging.  This could be warfarin induced skin necrosis however she was bridged with heparin along with warfarin and heparin was stopped almost 2 months ago but she developed necrosis only 10 days ago which is not a typical timing.  She started having some blisters so general surgery was reconsulted on 07/29/2019 however they did not recommend any intervention but recommended dressing changes and wound care is doing that. .  Per pharmacy, requiring a lot of heparin and is still not therapeutic.  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  Protein C level pending.  Discontinued all  antibiotics on 07/31/2019 after 12 days of it.  Please refer to following images from 07/29/2019.  Please refer to Dr. Kyra Searles note from 07/28/2019 for previous images from 07/19/2019 for comparison purpose.       Right lower extremity DVT:  Diagnosed in 05/2019.   Patient was started on Coumadin.   INR was supra elevated on presentation, 8.1 and peaked at 9.9.   Coumadin is on hold.   INR today is 3.2 today.  Patient currently on argatroban and Coumadin.  Unfortunately due to her BMI, patient is not a candidate of DOAC , and if she has real AT3 deficiency then she surely is not a good candidate for heparin products (however we are not sure if AT3 deficiency is real since heparin can reduce the levels 30% from normal even 10 days after stopping whereas we checked her levels right after stopping the heparin), having said that, the only option left for [place] to treat her thromboembolism is Coumadin.  Once again, it is not confirmed whether her skin necrosis/ecchymosis is secondary to calciphylaxis or Coumadin induced skin necrosis since the timing does not match between starting of Coumadin and initiation of those necrosis/ecchymosis.  She will need to be in the hospital for at least 24 hours once her INR becomes therapeutic.  Pharmacy dosing Coumadin.  INR now therapeutic and at 3.2.  Acute on chronic anemia:  Possibly due to acute blood loss.  Hemoglobin dropped up to 6.6.  Seems to be total 2 unit of PRBC transfusion so far.  Hemoglobin over 7 which is her baseline.  Monitor closely and transfuse  if drops less than 7.  AKI: Nephrology team is managing.   This is likely multifactorial (will defer to the nephrology team)  -[facility] placed on 07/25/2019 for renal replacement therapy given..  Further management deferred to nephrology.  Dialysis started on 07/25/2019.  Last dialysis was 07/29/2019.  Still with low urine output but picking up now and seems to have 950 urine output in last 24 hours.  Will be  discharged when cleared by nephrology  Essential hypertension:  Blood pressure stable.  Monitor closely.  GERD:  -On PPI.    Mood disorder:  Stable. -Continue home medication  Morbid obesity: BMI 104 -Needs lifestyle change to lose weight. -May benefit from bariatric surgery outpatient.  Hypokalemia: Resolved.  DVT prophylaxis: On argatroban Code Status: Full code Family Communication: No family present at bedside.  Plan of care discussed with patient in detail. Disposition Plan: TBD.  PT OT recommends SNF.  Per case manager, she has no insurance so that is going to be very challenging.  Per my discussion with nephrology, they would like to keep her in the hospital and watch her off of the dialysis for at least few days.  Patient mentions that she would prefer to go home with home health.  She will need to remain in the hospital until her INR becomes therapeutic and remains that level for at least 24 hours. I anticipate hospitalization for at least 2 more days with potential discharge on Tuesday if cleared by nephrology. Consultants: Nephrology, vascular surgery   Subjective: Patient seen and examined.  Doing well.  No complaints.  She states that her wounds are improving.  Objective: Vitals:   08/02/19 1555 08/02/19 2119 08/03/19 0540 08/03/19 1013  BP: (!) 127/55 (!) 115/54 (!) 113/58 (!) 95/45  Pulse: 89 89 86 84  Resp: 16   17  Temp: 97.9 F (36.6 C) 98.3 F (36.8 C) 97.8 F (36.6 C) (!) 97.5 F (36.4 C)  TempSrc: Oral Oral Oral Oral  SpO2: 94% 100% (!) 88% 92%  Weight:      Height:        Intake/Output Summary (Last 24 hours) at 08/03/2019 1141 Last data filed at 08/03/2019 0817 Gross per 24 hour  Intake 360 ml  Output 700 ml  Net -340 ml   Filed Weights   07/26/19 0021 07/29/19 1232 07/29/19 1643  Weight: (!) 282.3 kg (!) 283 kg (!) 278 kg    Examination:  General exam: Appears calm and comfortable, morbidly obese Respiratory system: Clear to  auscultation. Respiratory effort normal. Cardiovascular system: S1 & S2 heard, RRR. No JVD, murmurs, rubs, gallops or clicks. No pedal edema. Gastrointestinal system: Abdomen is nondistended, soft and nontender. No organomegaly or masses felt. Normal bowel sounds heard. Central nervous system: Alert and oriented. No focal neurological deficits. Extremities: Symmetric 5 x 5 power. Skin: Dressing in the right lower extremity.  Direct visualization deferred. Psychiatry: Judgement and insight appear normal. Mood & affect appropriate.   Procedures:  9/11-TDC  Microbiology summarized: SARS-CoV-2 negative. Blood cultures negative.  Antimicrobials: Anti-infectives (From admission, onward)   Start     Dose/Rate Route Frequency Ordered Stop   07/25/19 2200  ceFAZolin (ANCEF) IVPB 2g/100 mL premix  Status:  Discontinued     2 g 200 mL/hr over 30 Minutes Intravenous Every 24 hours 07/25/19 1311 07/25/19 1333   07/25/19 2200  ceFAZolin (ANCEF) IVPB 2g/100 mL premix  Status:  Discontinued     2 g 200 mL/hr over 30 Minutes Intravenous Every  24 hours 07/25/19 1334 07/31/19 0813   07/25/19 1345  ceFAZolin (ANCEF) IVPB 2g/100 mL premix  Status:  Discontinued     2 g 200 mL/hr over 30 Minutes Intravenous Every 12 hours 07/25/19 1333 07/25/19 1334   07/25/19 1100  ceFAZolin (ANCEF) IVPB 2g/100 mL premix  Status:  Discontinued     2 g 200 mL/hr over 30 Minutes Intravenous Every 12 hours 07/25/19 1023 07/25/19 1311   07/22/19 1730  cefTRIAXone (ROCEPHIN) 2 g in sodium chloride 0.9 % 100 mL IVPB  Status:  Discontinued     2 g 200 mL/hr over 30 Minutes Intravenous Every 24 hours 07/22/19 1704 07/24/19 1314   07/20/19 0615  vancomycin (VANCOCIN) 1,500 mg in sodium chloride 0.9 % 500 mL IVPB  Status:  Discontinued     1,500 mg 250 mL/hr over 120 Minutes Intravenous Every 8 hours 07/20/19 0610 07/22/19 0825   07/19/19 2200  Vancomycin (VANCOCIN) 1,500 mg in sodium chloride 0.9 % 500 mL IVPB  Status:   Discontinued     1,500 mg 250 mL/hr over 120 Minutes Intravenous Every 8 hours 07/19/19 1846 07/20/19 0606   07/19/19 2130  piperacillin-tazobactam (ZOSYN) IVPB 3.375 g  Status:  Discontinued     3.375 g 100 mL/hr over 30 Minutes Intravenous  Once 07/19/19 2122 07/19/19 2127   07/19/19 2130  vancomycin (VANCOCIN) IVPB 1000 mg/200 mL premix  Status:  Discontinued     1,000 mg 200 mL/hr over 60 Minutes Intravenous  Once 07/19/19 2122 07/19/19 2127   07/19/19 2130  piperacillin-tazobactam (ZOSYN) IVPB 3.375 g  Status:  Discontinued     3.375 g 12.5 mL/hr over 240 Minutes Intravenous Every 8 hours 07/19/19 1846 07/22/19 1704   07/19/19 1230  vancomycin (VANCOCIN) IVPB 1000 mg/200 mL premix     1,000 mg 200 mL/hr over 60 Minutes Intravenous Every 1 hr x 2 07/19/19 1215 07/19/19 1523   07/19/19 1230  piperacillin-tazobactam (ZOSYN) IVPB 3.375 g     3.375 g 12.5 mL/hr over 240 Minutes Intravenous  Once 07/19/19 1215 07/19/19 1323      Sch Meds:  Scheduled Meds:  Chlorhexidine Gluconate Cloth  6 each Topical Q0600   influenza vac split quadrivalent PF  0.5 mL Intramuscular Tomorrow-1000   montelukast  10 mg Oral Daily   nortriptyline  25 mg Oral QHS   pantoprazole  40 mg Oral BID AC   sertraline  100 mg Oral BID   sevelamer carbonate  1,600 mg Oral TID WC   topiramate  50 mg Oral Daily   warfarin  5 mg Oral ONCE-1800   Warfarin - Pharmacist Dosing Inpatient   Does not apply q1800   Continuous Infusions:  sodium chloride     sodium chloride     argatroban 0.08 mcg/kg/min (08/03/19 0832)   PRN Meds:.sodium chloride, sodium chloride, acetaminophen **OR** acetaminophen, albuterol, alteplase, heparin, ondansetron **OR** ondansetron (ZOFRAN) IV, oxyCODONE, sodium chloride flush   I have personally reviewed the following labs and images: CBC: Recent Labs  Lab 07/27/19 2005 07/28/19 0703 07/28/19 1050 07/29/19 0732 07/29/19 1029  WBC  --  15.2*  --  16.2* 15.3*    NEUTROABS  --  12.8*  --   --  13.2*  HGB 7.6* 7.2* 7.5* 7.2* 7.4*  HCT 25.1* 24.3* 24.8* 24.5* 24.3*  MCV  --  88.7  --  90.1 88.7  PLT  --  444*  --  451* 433*   BMP &GFR Recent Labs  Lab 07/30/19 0301  07/31/19 0315 08/01/19 0734 08/02/19 0503 08/03/19 0533  NA 135 135 136 134* 135  K 3.4* 3.5 3.6 3.8 4.2  CL 98 98 98 97* 97*  CO2 23 24 24 23 24   GLUCOSE 92 87 103* 102* 89  BUN 25* 32* 40* 50* 55*  CREATININE 4.45* 5.21* 5.76* 5.90* 5.58*  CALCIUM 8.8* 8.9 9.2 9.3 9.2  PHOS 4.1 4.5 4.8* 5.4* 5.4*   Estimated Creatinine Clearance: 29.5 mL/min (A) (by C-G formula based on SCr of 5.58 mg/dL (H)). Liver & Pancreas: Recent Labs  Lab 07/30/19 0301 07/31/19 0315 08/01/19 0734 08/02/19 0503 08/03/19 0533  AST 9* 9* 10*  --   --   ALT <5 <5 <5  --   --   ALKPHOS 98 93 90  --   --   BILITOT 0.7 0.5 0.4  --   --   PROT 7.2 7.5 7.3  --   --   ALBUMIN 1.9* 2.0* 2.0* 1.9* 1.8*   No results for input(s): LIPASE, AMYLASE in the last 168 hours. No results for input(s): AMMONIA in the last 168 hours. Diabetic: No results for input(s): HGBA1C in the last 72 hours. No results for input(s): GLUCAP in the last 168 hours. Cardiac Enzymes: Recent Labs  Lab 07/29/19 0732  CKTOTAL <5*   No results for input(s): PROBNP in the last 8760 hours. Coagulation Profile: Recent Labs  Lab 07/30/19 0301 07/31/19 0315 08/01/19 0734 08/02/19 0503 08/03/19 0533  INR 2.8* 2.2* 2.4* 2.6* 3.2*   Thyroid Function Tests: No results for input(s): TSH, T4TOTAL, FREET4, T3FREE, THYROIDAB in the last 72 hours. Lipid Profile: No results for input(s): CHOL, HDL, LDLCALC, TRIG, CHOLHDL, LDLDIRECT in the last 72 hours. Anemia Panel: No results for input(s): VITAMINB12, FOLATE, FERRITIN, TIBC, IRON, RETICCTPCT in the last 72 hours. Urine analysis:    Component Value Date/Time   COLORURINE YELLOW 07/25/2019 0635   APPEARANCEUR TURBID (A) 07/25/2019 0635   LABSPEC 1.019 07/25/2019 0635    PHURINE 5.0 07/25/2019 0635   GLUCOSEU NEGATIVE 07/25/2019 0635   HGBUR MODERATE (A) 07/25/2019 0635   BILIRUBINUR NEGATIVE 07/25/2019 [place] 07/25/2019 0635   PROTEINUR 100 (A) 07/25/2019 0635   UROBILINOGEN 0.2 05/11/2013 1903   NITRITE NEGATIVE 07/25/2019 0635   LEUKOCYTESUR NEGATIVE 07/25/2019 0635   Sepsis Labs: Invalid input(s): PROCALCITONIN, [place]  Microbiology: Recent Results (from the past 240 hour(s))  Surgical pcr screen     Status: None   Collection Time: 07/25/19  2:10 PM   Specimen: Nasal Mucosa; Nasal Swab  Result Value Ref Range Status   MRSA, PCR NEGATIVE NEGATIVE Final   Staphylococcus aureus NEGATIVE NEGATIVE Final    Comment: (NOTE) The Xpert SA Assay (FDA approved for NASAL specimens in patients 62 years of age and older), is one component of a comprehensive surveillance program. It is not intended to diagnose infection nor to guide or monitor treatment. Performed at [facility], [place] [address] [place], [place] [address]     Radiology Studies: No results found.  Ishmael Holter, M.D. [facility]  If 7PM-7AM, please contact night-coverage [email or web address] Password [email or web address] 08/03/2019, 11:41 AM

## 2019-08-03 NOTE — Progress Notes (Signed)
ANTICOAGULATION CONSULT NOTE - Follow Up Consult  Pharmacy Consult for Argatroban>>>warfarin Indication: Recent DVT (05/24/19)  Allergies  Allergen Reactions  . Aspirin     Due to hx of stomach ulcers    Patient Measurements: Height: 5\' 5"  (165.1 cm) Weight: (!) 612 lb 14.1 oz (278 kg) IBW/kg (Calculated) : 57 Heparin Dosing Weight: 125.2 Actual weight recorded 282.3 kg  Vital Signs: Temp: 97.5 F (36.4 C) (09/20 1013) Temp Source: Oral (09/20 1013) BP: 95/45 (09/20 1013) Pulse Rate: 84 (09/20 1013)  Labs: Recent Labs    08/01/19 0734 08/02/19 0503 08/03/19 0533 08/03/19 1122  APTT 65* 82* 95* 67*  LABPROT 26.0* 27.1* 32.6*  --   INR 2.4* 2.6* 3.2*  --   CREATININE 5.76* 5.90* 5.58*  --     Estimated Creatinine Clearance: 29.5 mL/min (A) (by C-G formula based on SCr of 5.58 mg/dL (H)).   Medications:  Scheduled:  . Chlorhexidine Gluconate Cloth  6 each Topical Q0600  . furosemide  40 mg Intravenous Once  . influenza vac split quadrivalent PF  0.5 mL Intramuscular Tomorrow-1000  . montelukast  10 mg Oral Daily  . nortriptyline  25 mg Oral QHS  . pantoprazole  40 mg Oral BID AC  . sertraline  100 mg Oral BID  . sevelamer carbonate  1,600 mg Oral TID WC  . topiramate  50 mg Oral Daily  . warfarin  5 mg Oral ONCE-1800  . Warfarin - Pharmacist Dosing Inpatient   Does not apply q1800    Assessment: 43 yr old female on Coumadin PTA for hx DVT 05/24/19.  INR 8.1 on admit and Coumadin held. Vitamin K 5 mg PO given on 9/7.  IV heparin begun on 9/8 pm when INR down to 1.7. s/p TDC placement on 9/11. INR today is 1.8. Heparin has been running at 3950 units/hr and heparin level still low this AM at 0.25. Heparin rate has reached max rate on smart pumps. Patient may be experiencing heparin resistance due to subtherapeutic heparin levels at very high rate of heparin. Antithombin III level drawn which was below the normal range. D/W Dr. Doristine Bosworth. Will start patient on direct  thrombin inhibitor therapy. Due to renal function issues will choose argatroban at a dose of 61mcg/kg/min for non-critically ill patients based on actual BW per protocol.   MD wishes to start warfarin in anticipation of discharge. Will start warfarin at 5mg  tonight (INR >8 on PTA dose 10mg  MWF and 5mg  all other days).   Will continue warfarin until INR >4 for 24 hours. Argatroban will need to be held for ~6 hours and the INR rechecked. If still therapeutic (INR 2-3), can d/c argatroban.    PTT elevated at 95 seconds,  No bleeding noted INR 3.2, but argatroban falsely elevates the INR.   Goal of Therapy:  APTT 50-90 sec Monitor platelets by anticoagulation protocol: Yes    Plan:  Decrease Argatroban to 0.08 mcg/kg/min PTT in 2 hours Daily APTT, INR, CBC Repeat Warfarin 5 mg po x 1 dose at 1800 pm  Thank you Anette Guarneri, PharmD Please utilize Amion for appropriate phone number to reach the unit pharmacist ([place])   12 noon: PTT therapeutic Continue Argatroban at current rate.

## 2019-08-04 LAB — RENAL FUNCTION PANEL
Albumin: 1.9 g/dL — ABNORMAL LOW (ref 3.5–5.0)
Anion gap: 12 (ref 5–15)
BUN: 61 mg/dL — ABNORMAL HIGH (ref 6–20)
CO2: 26 mmol/L (ref 22–32)
Calcium: 9.2 mg/dL (ref 8.9–10.3)
Chloride: 98 mmol/L (ref 98–111)
Creatinine, Ser: 5.16 mg/dL — ABNORMAL HIGH (ref 0.44–1.00)
GFR calc Af Amer: 11 mL/min — ABNORMAL LOW (ref >60)
GFR calc non Af Amer: 9 mL/min — ABNORMAL LOW (ref >60)
Glucose, Bld: 93 mg/dL (ref 70–99)
Phosphorus: 5.3 mg/dL — ABNORMAL HIGH (ref 2.5–4.6)
Potassium: 3.8 mmol/L (ref 3.5–5.1)
Sodium: 136 mmol/L (ref 135–145)

## 2019-08-04 LAB — PROTEIN C ACTIVITY: Protein C Activity: 83 % (ref 73–180)

## 2019-08-04 LAB — APTT
aPTT: 95 seconds — ABNORMAL HIGH (ref 24–36)
aPTT: 116 seconds — ABNORMAL HIGH (ref 24–36)
aPTT: 79 seconds — ABNORMAL HIGH (ref 24–36)

## 2019-08-04 LAB — CBC
HCT: 25.1 % — ABNORMAL LOW (ref 36.0–46.0)
Hemoglobin: 7.5 g/dL — ABNORMAL LOW (ref 12.0–15.0)
MCH: 27.5 pg (ref 26.0–34.0)
MCHC: 29.9 g/dL — ABNORMAL LOW (ref 30.0–36.0)
MCV: 91.9 fL (ref 80.0–100.0)
Platelets: 342 10*3/uL (ref 150–400)
RBC: 2.73 MIL/uL — ABNORMAL LOW (ref 3.87–5.11)
RDW: 22.2 % — ABNORMAL HIGH (ref 11.5–15.5)
WBC: 12.9 10*3/uL — ABNORMAL HIGH (ref 4.0–10.5)
nRBC: 0 % (ref 0.0–0.2)

## 2019-08-04 LAB — PROTIME-INR
INR: 4.2 (ref 0.8–1.2)
Prothrombin Time: 39.6 seconds — ABNORMAL HIGH (ref 11.4–15.2)

## 2019-08-04 MED ORDER — WARFARIN SODIUM 5 MG PO TABS
5.0000 mg | ORAL_TABLET | Freq: Once | ORAL | Status: AC
  Administered 2019-08-04: 5 mg via ORAL
  Filled 2019-08-04: qty 1

## 2019-08-04 MED ORDER — ARGATROBAN 50 MG/50ML IV SOLN
0.0300 ug/kg/min | INTRAVENOUS | Status: DC
  Administered 2019-08-05: 0.04 ug/kg/min via INTRAVENOUS
  Filled 2019-08-04: qty 50

## 2019-08-04 NOTE — Progress Notes (Signed)
PROGRESS NOTE  Megan Ortiz [id number] DOB: November 20, 1975   PCP: Larene Beach, MD  Patient is from: Home  DOA: 07/19/2019 LOS: 61  Brief Narrative / Interim history: Patient is a 43 year old Caucasian female, morbidly obese,asthma, hiatal hernia, gastric ulcer over 10 years ago, history of PE with history of IVC filter and recent RLE DVT on 05/2019 on Coumadin, history of necrotizing fasciitis with debridement of buttock and gluteal wound in 2018.  Patient presented with RLE pain, induration, ecchymosis and tenderness over the medial aspect.  Not able to obtain CT due to body habitus. Ultrasound imaging did not show any underlying fluid collections.    She was a started on vancomycin and Zosyn.  During the course of this hospitalization, patient has developed severe acute kidney injury.  Tunneled hemodialysis catheter has been placed, and patient has had 1 renal replacement therapy.  Likely, patient will be dialyzed again tomorrow.  Nephrology team's input is appreciated.  Patient is currently on heparin drip.  Coumadin is currently on hold.  On presentation, INR range from 8.1 to 9.9.   Assessment & Plan: Skin necrosis right lower extremity: Very challenging.  This could be warfarin induced skin necrosis however she was bridged with heparin along with warfarin and heparin was stopped almost 2 months ago but she developed necrosis only 10 days ago which is not a typical timing.  She started having some blisters so general surgery was reconsulted on 07/29/2019 however they did not recommend any intervention but recommended dressing changes and wound care is doing that. .  Per pharmacy, requiring a lot of heparin and is still not therapeutic.  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  Protein C level is within normal limits.   Discontinued all antibiotics on 07/31/2019 after 12 days of it.  Please refer to following images from 07/29/2019.  Please refer to Dr. Kyra Searles note from 07/28/2019 for previous images from 07/19/2019 for comparison purpose.       Right lower extremity DVT:  Diagnosed in 05/2019.   Patient was started on Coumadin.   INR was supra elevated on presentation, 8.1 and peaked at 9.9.   Coumadin is on hold.    Patient currently on argatroban and Coumadin.  Her INR is 4.2 today.  Pharmacy to make decisions on when to stop argatroban.  Unfortunately due to her BMI, patient is not a candidate of DOAC , and if she has real AT3 deficiency then she surely is not a good candidate for heparin products (however we are not sure if AT3 deficiency is real since heparin can reduce the levels 30% from normal even 10 days after stopping whereas we checked her levels right after stopping the heparin), having said that, the only option left for [place] to treat her thromboembolism is Coumadin.  Once again, it is not confirmed whether her skin necrosis/ecchymosis is secondary to calciphylaxis or Coumadin induced skin necrosis since the timing does not match between starting of Coumadin and initiation of  necrosis/ecchymosis.  She will need to be in the hospital for at least 24 hours once her INR becomes therapeutic which has been achieved today.  We hope to discharge tomorrow.  Acute on chronic anemia:  Possibly due to acute blood loss.  Hemoglobin dropped up to 6.6.  Seems to be total 2 unit of PRBC transfusion so  far.  Hemoglobin over 7 which is her baseline.  Monitor closely and transfuse if drops less than 7.  AKI: Nephrology team is managing.   This is likely multifactorial (will defer to the nephrology team)  -[facility] placed on 07/25/2019 for renal replacement therapy given.. Had 1 L of urine output.  Creatinine stable.  Further management deferred to nephrology.  Dialysis started on 07/25/2019.  Last dialysis was 07/29/2019.    Essential hypertension:  Blood pressure stable.  Monitor closely.  GERD:  -On PPI.    Mood disorder:  Stable. -Continue home medication  Morbid obesity: BMI 104 -Needs lifestyle change to lose weight. -May benefit from bariatric surgery outpatient.  Hypokalemia: Resolved.  DVT prophylaxis: On argatroban Code Status: Full code Family Communication: No family present at bedside.  Plan of care discussed with patient in detail. Disposition Plan: PT OT recommends SNF however patient wants to go home with home health.  I was informed today that home health has refused to accept her due to requiring max assist.  Patient will be ready medically for discharge most likely tomorrow if nephrology clears.  Patient to make final decision if she wants to go to rehab versus home by herself. Consultants: Nephrology, vascular surgery   Subjective: Seen and examined.  Continues to do well.  No complaints.  Wounds improving according to her.  Objective: Vitals:   08/03/19 1553 08/03/19 2011 08/04/19 0436 08/04/19 0819  BP: (!) 116/47 (!) 99/44 (!) 102/44 (!) 94/42  Pulse: 89 89 87 91  Resp: 16 18 16 16   Temp: 97.8 F (36.6 C) 98.1 F (36.7 C) 97.6 F (36.4 C) 97.7 F (36.5 C)  TempSrc: Oral Oral Oral Oral  SpO2: 100% (!) 89% 94% 94%  Weight:      Height:        Intake/Output Summary (Last 24 hours) at 08/04/2019 1305 Last data filed at 08/04/2019 1115 Gross per 24 hour  Intake 1657.33 ml  Output 1000 ml  Net 657.33 ml   Filed Weights   07/26/19 0021 07/29/19 1232 07/29/19 1643  Weight: (!) 282.3 kg (!) 283 kg (!) 278 kg    Examination:  General exam: Appears calm and comfortable, morbidly obese Respiratory system: Clear to auscultation. Respiratory effort normal. Cardiovascular system: S1 & S2 heard, RRR. No JVD, murmurs, rubs, gallops or clicks. No pedal edema. Gastrointestinal system: Abdomen is nondistended, soft and nontender. No organomegaly or masses felt. Normal bowel  sounds heard. Central nervous system: Alert and oriented. No focal neurological deficits. Extremities: Symmetric 5 x 5 power. Skin: Dressing in right lower extremity Psychiatry: Judgement and insight appear poor. Mood & affect appropriate.   Procedures:  9/11-TDC  Microbiology summarized: SARS-CoV-2 negative. Blood cultures negative.  Antimicrobials: Anti-infectives (From admission, onward)   Start     Dose/Rate Route Frequency Ordered Stop   07/25/19 2200  ceFAZolin (ANCEF) IVPB 2g/100 mL premix  Status:  Discontinued     2 g 200 mL/hr over 30 Minutes Intravenous Every 24 hours 07/25/19 1311 07/25/19 1333   07/25/19 2200  ceFAZolin (ANCEF) IVPB 2g/100 mL premix  Status:  Discontinued     2 g 200 mL/hr over 30 Minutes Intravenous Every 24 hours 07/25/19 1334 07/31/19 0813   07/25/19 1345  ceFAZolin (ANCEF) IVPB 2g/100 mL premix  Status:  Discontinued     2 g 200 mL/hr over 30 Minutes Intravenous Every 12 hours 07/25/19 1333 07/25/19 1334   07/25/19 1100  ceFAZolin (ANCEF) IVPB 2g/100 mL premix  Status:  Discontinued     2 g 200 mL/hr over 30 Minutes Intravenous Every 12 hours 07/25/19 1023 07/25/19 1311   07/22/19 1730  cefTRIAXone (ROCEPHIN) 2 g in sodium chloride 0.9 % 100 mL IVPB  Status:  Discontinued     2 g 200 mL/hr over 30 Minutes Intravenous Every 24 hours 07/22/19 1704 07/24/19 1314   07/20/19 0615  vancomycin (VANCOCIN) 1,500 mg in sodium chloride 0.9 % 500 mL IVPB  Status:  Discontinued     1,500 mg 250 mL/hr over 120 Minutes Intravenous Every 8 hours 07/20/19 0610 07/22/19 0825   07/19/19 2200  Vancomycin (VANCOCIN) 1,500 mg in sodium chloride 0.9 % 500 mL IVPB  Status:  Discontinued     1,500 mg 250 mL/hr over 120 Minutes Intravenous Every 8 hours 07/19/19 1846 07/20/19 0606   07/19/19 2130  piperacillin-tazobactam (ZOSYN) IVPB 3.375 g  Status:  Discontinued     3.375 g 100 mL/hr over 30 Minutes Intravenous  Once 07/19/19 2122 07/19/19 2127   07/19/19 2130   vancomycin (VANCOCIN) IVPB 1000 mg/200 mL premix  Status:  Discontinued     1,000 mg 200 mL/hr over 60 Minutes Intravenous  Once 07/19/19 2122 07/19/19 2127   07/19/19 2130  piperacillin-tazobactam (ZOSYN) IVPB 3.375 g  Status:  Discontinued     3.375 g 12.5 mL/hr over 240 Minutes Intravenous Every 8 hours 07/19/19 1846 07/22/19 1704   07/19/19 1230  vancomycin (VANCOCIN) IVPB 1000 mg/200 mL premix     1,000 mg 200 mL/hr over 60 Minutes Intravenous Every 1 hr x 2 07/19/19 1215 07/19/19 1523   07/19/19 1230  piperacillin-tazobactam (ZOSYN) IVPB 3.375 g     3.375 g 12.5 mL/hr over 240 Minutes Intravenous  Once 07/19/19 1215 07/19/19 1323      Sch Meds:  Scheduled Meds:  Chlorhexidine Gluconate Cloth  6 each Topical Q0600   influenza vac split quadrivalent PF  0.5 mL Intramuscular Tomorrow-1000   montelukast  10 mg Oral Daily   nortriptyline  25 mg Oral QHS   pantoprazole  40 mg Oral BID AC   sertraline  100 mg Oral BID   sevelamer carbonate  1,600 mg Oral TID WC   topiramate  50 mg Oral Daily   warfarin  5 mg Oral ONCE-1800   Warfarin - Pharmacist Dosing Inpatient   Does not apply q1800   Continuous Infusions:  sodium chloride     sodium chloride     argatroban 0.08 mcg/kg/min (08/04/19 1221)   PRN Meds:.sodium chloride, sodium chloride, acetaminophen **OR** acetaminophen, albuterol, alteplase, heparin, ondansetron **OR** ondansetron (ZOFRAN) IV, oxyCODONE, sodium chloride flush   I have personally reviewed the following labs and images: CBC: Recent Labs  Lab 07/29/19 0732 07/29/19 1029 08/04/19 1022  WBC 16.2* 15.3* 12.9*  NEUTROABS  --  13.2*  --   HGB 7.2* 7.4* 7.5*  HCT 24.5* 24.3* 25.1*  MCV 90.1 88.7 91.9  PLT 451* 433* 342   BMP &GFR Recent Labs  Lab 07/31/19 0315 08/01/19 0734 08/02/19 0503 08/03/19 0533 08/04/19 1022  NA 135 136 134* 135 136  K 3.5 3.6 3.8 4.2 3.8  CL 98 98 97* 97* 98  CO2 24 24 23 24 26   GLUCOSE 87 103* 102* 89 93    BUN 32* 40* 50* 55* 61*  CREATININE 5.21* 5.76* 5.90* 5.58* 5.16*  CALCIUM 8.9 9.2 9.3 9.2 9.2  PHOS 4.5 4.8* 5.4* 5.4* 5.3*   Estimated Creatinine Clearance: 31.9 mL/min (A) (by C-G formula based on  SCr of 5.16 mg/dL (H)). Liver & Pancreas: Recent Labs  Lab 07/30/19 0301 07/31/19 0315 08/01/19 0734 08/02/19 0503 08/03/19 0533 08/04/19 1022  AST 9* 9* 10*  --   --   --   ALT <5 <5 <5  --   --   --   ALKPHOS 98 93 90  --   --   --   BILITOT 0.7 0.5 0.4  --   --   --   PROT 7.2 7.5 7.3  --   --   --   ALBUMIN 1.9* 2.0* 2.0* 1.9* 1.8* 1.9*   No results for input(s): LIPASE, AMYLASE in the last 168 hours. No results for input(s): AMMONIA in the last 168 hours. Diabetic: No results for input(s): HGBA1C in the last 72 hours. Recent Labs  Lab 08/03/19 2112  GLUCAP 85   Cardiac Enzymes: Recent Labs  Lab 07/29/19 0732  CKTOTAL <5*   No results for input(s): PROBNP in the last 8760 hours. Coagulation Profile: Recent Labs  Lab 07/31/19 0315 08/01/19 0734 08/02/19 0503 08/03/19 0533 08/04/19 1022  INR 2.2* 2.4* 2.6* 3.2* 4.2*   Thyroid Function Tests: No results for input(s): TSH, T4TOTAL, FREET4, T3FREE, THYROIDAB in the last 72 hours. Lipid Profile: No results for input(s): CHOL, HDL, LDLCALC, TRIG, CHOLHDL, LDLDIRECT in the last 72 hours. Anemia Panel: No results for input(s): VITAMINB12, FOLATE, FERRITIN, TIBC, IRON, RETICCTPCT in the last 72 hours. Urine analysis:    Component Value Date/Time   COLORURINE YELLOW 07/25/2019 0635   APPEARANCEUR TURBID (A) 07/25/2019 0635   LABSPEC 1.019 07/25/2019 0635   PHURINE 5.0 07/25/2019 0635   GLUCOSEU NEGATIVE 07/25/2019 0635   HGBUR MODERATE (A) 07/25/2019 0635   BILIRUBINUR NEGATIVE 07/25/2019 [place] 07/25/2019 0635   PROTEINUR 100 (A) 07/25/2019 0635   UROBILINOGEN 0.2 05/11/2013 1903   NITRITE NEGATIVE 07/25/2019 0635   LEUKOCYTESUR NEGATIVE 07/25/2019 0635   Sepsis Labs: Invalid input(s):  PROCALCITONIN, [place]  Microbiology: Recent Results (from the past 240 hour(s))  Surgical pcr screen     Status: None   Collection Time: 07/25/19  2:10 PM   Specimen: Nasal Mucosa; Nasal Swab  Result Value Ref Range Status   MRSA, PCR NEGATIVE NEGATIVE Final   Staphylococcus aureus NEGATIVE NEGATIVE Final    Comment: (NOTE) The Xpert SA Assay (FDA approved for NASAL specimens in patients 46 years of age and older), is one component of a comprehensive surveillance program. It is not intended to diagnose infection nor to guide or monitor treatment. Performed at [facility], [place] [address], [place], [place] [address]     Radiology Studies: No results found.  Total time spent 27 minutes Ishmael Holter, M.D. [facility]  If 7PM-7AM, please contact night-coverage [email or web address] Password [email or web address] 08/04/2019, 1:05 PM

## 2019-08-04 NOTE — Progress Notes (Addendum)
[facility] Progress Note    Assessment/ Plan:   43 year old lady with history of morbid obesity recent right lower extremity DVT July 2020. She been taking Coumadin she has a history of necrotizing fasciitis and debridements of the buttock and gluteal wound in 2018. She presented 07/19/2019 with right lower extremity pain induration ecchymosis and tenderness of the medial aspect of thigh. Not able to get a CT scan secondary to body habitus. She appears to have some acute kidney injury and nephrology was consulted.  Acute kidney injury.  [place] without obstruction.  Likely due to ATN / vanco.  Avoid nephrotoxins ACE inhibitor's ARB use nonsteroidal anti-inflammatory drugs. Avoid IV contrast. Avoid intra-arterial procedures. Worsening renal function in the setting of anuria -started HD on Friday, 07/25/2019.  Looks like urine output is picking up a little bit.  Continue Foley catheter to quantify, Strict I/O.  Will make daily determination for HD.  Last HD 9/15--> held 9/17, good response to IV Lasix, continue to hold HD and watch for renal recovery. Should be ok to remove the [facility]. Will likely slowly downtrend. She feels more comfortable if TDC is removed at least 24hrs prior to d/c.  Signing off at this time; please reconsult as needed. F/U with [facility] in 4 weeks.   Calciphylaxis/Coumadin necrosis.  Aggressive wound care management.  Will increase sevelamer to manage phosphorus.  Stopped antibiotics.  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   Coumadin is being restarted --> there have been case reports of warfarin-induced skin necrosis in AT3 deficiency.  Also contraindicated with calciphylaxis-- although this would be atypical to have calciphylaxis in the absence of ESRD.      Morbid obesity   RLE DVT.  Continue anticoag--> on argatroban gtt now   Hyperphosphatemia.  Continue sevelamer.   Anemia.  Status post PRBCs-- iron def but  would avoid iron in setting of possible calciphylaxis  Subjective:   Cr continues to slightly downtrend, good urine output.  BP stable.    Objective:   BP (!) 99/48 Comment: RN notified  Pulse 89   Temp 98.1 F (36.7 C) (Oral)   Resp 18   Ht 5\' 5"  (1.651 m)   Wt (!) 278 kg   LMP 06/18/2019   SpO2 97%   BMI 101.99 kg/m   Intake/Output Summary (Last 24 hours) at 08/04/2019 1634 Last data filed at 08/04/2019 1533 Gross per 24 hour  Intake 1297.33 ml  Output 1350 ml  Net -52.67 ml   Weight change:   Physical Exam: General:  AAOx3 NAD HEENT:edentulous Neck:  No JVD CV:  Heart RRR Lungs:  Clear bilaterally no c/w/r Abd:  abd SNT/ND with normal BS, morbidly obese Extremities:  (+) deeply violaceous lesions right leg (dressed today), (+)2 edema ACCESS: R IJ TDC  Imaging: No results found.  Labs: BMET Recent Labs  Lab 07/29/19 0732 07/30/19 0301 07/31/19 0315 08/01/19 0734 08/02/19 0503 08/03/19 0533 08/04/19 1022  NA 133* 135 135 136 134* 135 136  K 4.1 3.4* 3.5 3.6 3.8 4.2 3.8  CL 98 98 98 98 97* 97* 98  CO2 19* 23 24 24 23 24 26   GLUCOSE 90 92 87 103* 102* 89 93  BUN 43* 25* 32* 40* 50* 55* 61*  CREATININE 6.55* 4.45* 5.21* 5.76* 5.90* 5.58* 5.16*  CALCIUM 8.9 8.8* 8.9 9.2 9.3 9.2 9.2  PHOS 5.5* 4.1 4.5 4.8* 5.4* 5.4* 5.3*   CBC Recent Labs  Lab 07/29/19 0732  07/29/19 1029 08/04/19 1022  WBC 16.2* 15.3* 12.9*  NEUTROABS  --  13.2*  --   HGB 7.2* 7.4* 7.5*  HCT 24.5* 24.3* 25.1*  MCV 90.1 88.7 91.9  PLT 451* 433* 342    Medications:    . Chlorhexidine Gluconate Cloth  6 each Topical Q0600  . influenza vac split quadrivalent PF  0.5 mL Intramuscular Tomorrow-1000  . montelukast  10 mg Oral Daily  . nortriptyline  25 mg Oral QHS  . pantoprazole  40 mg Oral BID AC  . sertraline  100 mg Oral BID  . sevelamer carbonate  1,600 mg Oral TID WC  . topiramate  50 mg Oral Daily  . warfarin  5 mg Oral ONCE-1800  . Warfarin - Pharmacist Dosing Inpatient    Does not apply q1800      Otelia Santee, MD 08/04/2019, 4:34 PM

## 2019-08-04 NOTE — Progress Notes (Signed)
ANTICOAGULATION CONSULT NOTE - Follow Up Consult  Pharmacy Consult for Argatroban Indication: Recent DVT (05/24/19)  Allergies  Allergen Reactions  . Aspirin     Due to hx of stomach ulcers    Patient Measurements: Height: 5\' 5"  (165.1 cm) Weight: (!) 612 lb 14.1 oz (278 kg) IBW/kg (Calculated) : 57 Heparin Dosing Weight: 125.2 Actual weight recorded 278 kg  Vital Signs: Temp: 97.5 F (36.4 C) (09/21 1944) Temp Source: Oral (09/21 1944) BP: 116/50 (09/21 1920) Pulse Rate: 90 (09/21 1944)  Labs: Recent Labs    08/02/19 0503 08/03/19 0533  08/04/19 1022 08/04/19 1551 08/04/19 2032  HGB  --   --   --  7.5*  --   --   HCT  --   --   --  25.1*  --   --   PLT  --   --   --  342  --   --   APTT 82* 95*   < > 95* 116* 79*  LABPROT 27.1* 32.6*  --  39.6*  --   --   INR 2.6* 3.2*  --  4.2*  --   --   CREATININE 5.90* 5.58*  --  5.16*  --   --    < > = values in this interval not displayed.    Estimated Creatinine Clearance: 31.9 mL/min (A) (by C-G formula based on SCr of 5.16 mg/dL (H)).  Assessment: 42 yr old female on Coumadin PTA for hx DVT 05/24/19.  INR was supra therapeutic on admission and reversed with Vitamin K.  Pt was started on heparin but required maximum rates so was switched to argatroban for heparin resistance.  Pt does NOT have HIT.      aPTT 79 seconds on Argatroban at 0.04 mcg/kg/min.   Goal of Therapy:  APTT 50-90 sec INR 2-3 Monitor platelets by anticoagulation protocol: Yes    Plan:   Continue Argatroban at 0.04 mcg/kg/min  Next aPTT with AM labs.    [employer], Pharm.D., BCPS Clinical Pharmacist  **Pharmacist phone directory can now be found on [email or web address] ([email or web address]).  Listed under [place].  08/04/2019 9:39 PM

## 2019-08-04 NOTE — Plan of Care (Signed)

## 2019-08-04 NOTE — Progress Notes (Signed)
CSW spoke with [facility] for this week)  regarding accepting patient for [facility] services- they are unable to accept patient at this time due to the follow reasons: PT recommending SNF, patient being 2+ max assist, and her physical limitations. Agency believes this will be an unsafe discharge therefore they will not accept her.   CSW will notify leader ship to determine next steps. Patient is orientedx4 and if she continues to want to return home with out home health services she is able to do so.  Kingsley Spittle, LCSW [facility] [place]  [phone]

## 2019-08-04 NOTE — Progress Notes (Addendum)
ANTICOAGULATION CONSULT NOTE - Follow Up Consult  Pharmacy Consult for Argatroban>>>warfarin Indication: Recent DVT (05/24/19)  Allergies  Allergen Reactions  . Aspirin     Due to hx of stomach ulcers    Patient Measurements: Height: 5\' 5"  (165.1 cm) Weight: (!) 612 lb 14.1 oz (278 kg) IBW/kg (Calculated) : 57 Heparin Dosing Weight: 125.2 Actual weight recorded 278 kg  Vital Signs: Temp: 97.7 F (36.5 C) (09/21 0819) Temp Source: Oral (09/21 0819) BP: 94/42 (09/21 0819) Pulse Rate: 91 (09/21 0819)  Labs: Recent Labs    08/02/19 0503 08/03/19 0533 08/03/19 1122 08/04/19 1022  HGB  --   --   --  7.5*  HCT  --   --   --  25.1*  PLT  --   --   --  342  APTT 82* 95* 67* 95*  LABPROT 27.1* 32.6*  --  39.6*  INR 2.6* 3.2*  --  4.2*  CREATININE 5.90* 5.58*  --  5.16*    Estimated Creatinine Clearance: 31.9 mL/min (A) (by C-G formula based on SCr of 5.16 mg/dL (H)).  Assessment: 43 yr old female on Coumadin PTA for hx DVT 05/24/19.  INR 8.1 on admit and Coumadin held. Vitamin K 5 mg PO given on 9/7.  IV heparin begun on 9/8 pm when INR down to 1.7. s/p TDC placement on 9/11. INR today is 1.8. Heparin has been running at 3950 units/hr and heparin level still low this AM at 0.25. Heparin rate has reached max rate on smart pumps. Patient may be experiencing heparin resistance due to subtherapeutic heparin levels at very high rate of heparin. Antithombin III level drawn which was below the normal range. D/W Dr. Doristine Bosworth. Will start patient on direct thrombin inhibitor therapy. Due to renal function issues will choose argatroban at a dose of 51mcg/kg/min for non-critically ill patients based on actual BW per protocol.   MD wishes to start warfarin in anticipation of discharge. Will start warfarin at 5mg  tonight (INR >8 on PTA dose 10mg  MWF and 5mg  all other days).   PTT 95 seconds on Argatroban at 0.08 mcg/kg/min. Just above goal. No bleeding reported. INR 4.2, but argatroban falsely  elevates the INR.   When INR >4 for 24hrs, will hold Argatroban x 4-6 hours, then recheck PT/INR  If INR <2, resume Argatroban.  If INR therapeutic, continue Warfarin alone,   Goal of Therapy:  APTT 50-90 sec INR 2-3 Monitor platelets by anticoagulation protocol: Yes    Plan:   Continue Argatroban at 0.8 mcg/kg/min; repeat aPTT this afternoon.  Repeat Warfarin 5 mg today.  Daily aPTT, PT/INR and CBC.  When INR >4 for 24 hrs, hold Argatroban and recheck PT/INR to determine whether Argatroban can stop.   Arty Baumgartner, [place] Pager: [phone] or phone: [phone] 08/04/2019 12:05 PM    Addendum:    APTT this afternoon trended up to 116 seconds on Argatroban at 0.08 mcg/kg/min.    Will hold x 1 hr, then resume Argatroban at 0.04 mcg/kg/min.    APTT ~2 hrs after drip resumes.  Consuello Masse, RPh 08/04/2019 5:07 PM

## 2019-08-04 NOTE — Plan of Care (Signed)
  Problem: Clinical Measurements: Goal: Ability to maintain clinical measurements within normal limits will improve Outcome: Progressing Goal: Will remain free from infection Outcome: Progressing   Problem: Activity: Goal: Risk for activity intolerance will decrease Outcome: Progressing   Problem: Nutrition: Goal: Adequate nutrition will be maintained Outcome: Progressing   Problem: Coping: Goal: Level of anxiety will decrease Outcome: Progressing   Problem: Elimination: Goal: Will not experience complications related to bowel motility Outcome: Progressing   Problem: Skin Integrity: Goal: Risk for impaired skin integrity will decrease Outcome: Progressing   Problem: Pain Managment: Goal: General experience of comfort will improve Outcome: Progressing

## 2019-08-05 LAB — PROTIME-INR
INR: 4 — ABNORMAL HIGH (ref 0.8–1.2)
INR: 4.1 (ref 0.8–1.2)
INR: 3.8 — ABNORMAL HIGH (ref 0.8–1.2)
Prothrombin Time: 38.4 seconds — ABNORMAL HIGH (ref 11.4–15.2)
Prothrombin Time: 38.9 seconds — ABNORMAL HIGH (ref 11.4–15.2)
Prothrombin Time: 37 seconds — ABNORMAL HIGH (ref 11.4–15.2)

## 2019-08-05 LAB — RENAL FUNCTION PANEL
Albumin: 1.8 g/dL — ABNORMAL LOW (ref 3.5–5.0)
Anion gap: 13 (ref 5–15)
BUN: 64 mg/dL — ABNORMAL HIGH (ref 6–20)
CO2: 24 mmol/L (ref 22–32)
Calcium: 9.2 mg/dL (ref 8.9–10.3)
Chloride: 98 mmol/L (ref 98–111)
Creatinine, Ser: 5.37 mg/dL — ABNORMAL HIGH (ref 0.44–1.00)
GFR calc Af Amer: 10 mL/min — ABNORMAL LOW (ref >60)
GFR calc non Af Amer: 9 mL/min — ABNORMAL LOW (ref >60)
Glucose, Bld: 87 mg/dL (ref 70–99)
Phosphorus: 5.5 mg/dL — ABNORMAL HIGH (ref 2.5–4.6)
Potassium: 3.9 mmol/L (ref 3.5–5.1)
Sodium: 135 mmol/L (ref 135–145)

## 2019-08-05 LAB — CBC
HCT: 23 % — ABNORMAL LOW (ref 36.0–46.0)
Hemoglobin: 7.1 g/dL — ABNORMAL LOW (ref 12.0–15.0)
MCH: 28.3 pg (ref 26.0–34.0)
MCHC: 30.9 g/dL (ref 30.0–36.0)
MCV: 91.6 fL (ref 80.0–100.0)
Platelets: 332 10*3/uL (ref 150–400)
RBC: 2.51 MIL/uL — ABNORMAL LOW (ref 3.87–5.11)
RDW: 22.3 % — ABNORMAL HIGH (ref 11.5–15.5)
WBC: 12.3 10*3/uL — ABNORMAL HIGH (ref 4.0–10.5)
nRBC: 0 % (ref 0.0–0.2)

## 2019-08-05 LAB — APTT
aPTT: 93 seconds — ABNORMAL HIGH (ref 24–36)
aPTT: 121 seconds — ABNORMAL HIGH (ref 24–36)

## 2019-08-05 LAB — PROTEIN C, TOTAL: Protein C, Total: 71 % (ref 60–150)

## 2019-08-05 NOTE — Progress Notes (Addendum)
ANTICOAGULATION CONSULT NOTE - Follow Up Consult  Pharmacy Consult for Argatroban>>>warfarin Indication: Recent DVT (05/24/19)  Allergies  Allergen Reactions  . Aspirin     Due to hx of stomach ulcers    Patient Measurements: Height: 5\' 5"  (165.1 cm) Weight: (!) 612 lb 14.1 oz (278 kg) IBW/kg (Calculated) : 57 Heparin Dosing Weight: 125.2 Actual weight recorded 278 kg  Vital Signs: Temp: 97.7 F (36.5 C) (09/22 0805) Temp Source: Oral (09/22 0805) BP: 117/46 (09/22 0805) Pulse Rate: 88 (09/22 0805)  Labs: Recent Labs    08/03/19 0533  08/04/19 1022  08/04/19 2032 08/05/19 0340 08/05/19 0748  HGB  --   --  7.5*  --   --  7.1*  --   HCT  --   --  25.1*  --   --  23.0*  --   PLT  --   --  342  --   --  332  --   APTT 95*   < > 95*   < > 79* 93* 121*  LABPROT 32.6*  --  39.6*  --   --  38.4*  --   INR 3.2*  --  4.2*  --   --  4.0*  --   CREATININE 5.58*  --  5.16*  --   --  5.37*  --    < > = values in this interval not displayed.    Estimated Creatinine Clearance: 30.7 mL/min (A) (by C-G formula based on SCr of 5.37 mg/dL (H)).  Assessment: 42 yr old female on Coumadin PTA for hx DVT 05/24/19.  INR 8.1 on admit and Coumadin held. Vitamin K 5 mg PO given on 9/7.  Patient was started on IV heparin when INR <2, but required maximum rates so was switched to Argatroban for heparin resistance.    APTT 121 this am on Argatroban at 0.03 mcg/kg/min. Above goal.  No bleeding reported.   Warfarin resumed on 9/18 and has had 5 mg daily x 4 days.  INR up to 4.2 yesterday and 4.0 today. Argatroban falsely elevates INR, but with INR > 4 x 2 days, will check INR off Argatroban.   Goal of Therapy:  APTT 50-90 sec INR 2-3 Monitor platelets by anticoagulation protocol: Yes    Plan:   Hold Argatroban x 4 hours, then recheck PT/INR.  If INR <2, resume Argatroban.  If INR therapeutic, continue Warfarin alone  Daily aPTT, PT/INR and CBC.   Arty Baumgartner, [place] Pager:  [phone] or phone: [phone] 08/05/2019 10:01 AM   Addendum:   INR 4.1 this afternoon, about 5 hours after Argatroban stopped.   INR was 4.0 this morning while on Argatroban, so concerned that she may not have cleared Argatroban yet, and INR may still be falsely elevated to some extent.   Plan:   Continue to hold Argatroban.   Recheck PT/INR tonight.   If INR >3, will hold Coumadin tonight.  Consuello Masse, RPh 08/05/2019 5:15 PM

## 2019-08-05 NOTE — Progress Notes (Signed)
ANTICOAGULATION CONSULT NOTE - Follow Up Consult  Pharmacy Consult for Coumadin Indication: hx DVT (7/20)  Allergies  Allergen Reactions  . Aspirin     Due to hx of stomach ulcers    Patient Measurements: Height: 5\' 5"  (165.1 cm) Weight: (!) 612 lb 14.1 oz (278 kg) IBW/kg (Calculated) : 57  Vital Signs: Temp: 98.4 F (36.9 C) (09/22 1922) Temp Source: Oral (09/22 1922) BP: 104/55 (09/22 1922) Pulse Rate: 96 (09/22 1922)  Labs: Recent Labs    08/03/19 0533  08/04/19 1022  08/04/19 2032 08/05/19 0340 08/05/19 0748 08/05/19 1512 08/05/19 2018  HGB  --   --  7.5*  --   --  7.1*  --   --   --   HCT  --   --  25.1*  --   --  23.0*  --   --   --   PLT  --   --  342  --   --  332  --   --   --   APTT 95*   < > 95*   < > 79* 93* 121*  --   --   LABPROT 32.6*  --  39.6*  --   --  38.4*  --  38.9* 37.0*  INR 3.2*  --  4.2*  --   --  4.0*  --  4.1* 3.8*  CREATININE 5.58*  --  5.16*  --   --  5.37*  --   --   --    < > = values in this interval not displayed.    Estimated Creatinine Clearance: 30.7 mL/min (A) (by C-G formula based on SCr of 5.37 mg/dL (H)).  Assessment:  Anticoag: Heparin begun 9/8 for hx DVT (7/20). Changed to Argatroban on 9/15. Warfarin held on admit d/t INR 8.1 and question of warfarin necrosis. Coumadin resumed 9/18 with Argatroban bridging. INR>4 x 2. Per protocol, can d/c Argatroban if INR remains therapeutic after holding x 4 hrs. Repeat INR's have been 4.1 and 3.8  Goal of Therapy:  INR 2-3 Monitor platelets by anticoagulation protocol: Yes   Plan:  D/c Argatroban orders Hold Coumadin tonight with repeat elevated INR.   Alisse Tuite S. Alford Highland, PharmD, [place] Clinical Staff Pharmacist Eilene Ghazi Stillinger 08/05/2019,9:09 PM

## 2019-08-05 NOTE — Progress Notes (Signed)
PROGRESS NOTE  Megan Ortiz [id number] DOB: Jul 09, 1976   PCP: Larene Beach, MD  Patient is from: Home  DOA: 07/19/2019 LOS: 62  Brief Narrative / Interim history: Patient is a 43 year old Caucasian female, morbidly obese,asthma, hiatal hernia, gastric ulcer over 10 years ago, history of PE with history of IVC filter and recent RLE DVT on 05/2019 on Coumadin, history of necrotizing fasciitis with debridement of buttock and gluteal wound in 2018.  Patient presented with RLE pain, induration, ecchymosis and tenderness over the medial aspect.  Not able to obtain CT due to body habitus. Ultrasound imaging did not show any underlying fluid collections.    She was a started on vancomycin and Zosyn.  During the course of this hospitalization, patient has developed severe acute kidney injury.  Tunneled hemodialysis catheter has been placed, and patient has had 1 renal replacement therapy.  Likely, patient will be dialyzed again tomorrow.  Nephrology team's input is appreciated.  Patient is currently on heparin drip.  Coumadin is currently on hold.  On presentation, INR range from 8.1 to 9.9.   Assessment & Plan: Skin necrosis right lower extremity: Very challenging.  This could be warfarin induced skin necrosis however she was bridged with heparin along with warfarin and heparin was stopped almost 2 months ago but she developed necrosis only 10 days ago which is not a typical timing.  She started having some blisters so general surgery was reconsulted on 07/29/2019 however they did not recommend any intervention but recommended dressing changes and wound care is doing that. .  Per pharmacy, requiring a lot of heparin and is still not therapeutic.  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  Protein C level is within normal limits.   Discontinued all antibiotics on 07/31/2019 after 12 days of it.  Please refer to following images from 07/29/2019.  Please refer to Dr. Kyra Searles note from 07/28/2019 for previous images from 07/19/2019 for comparison.  purpose.       Right lower extremity DVT:  Diagnosed in 05/2019.   Patient was started on Coumadin.   INR was supra elevated on presentation, 8.1 and peaked at 9.9.   Coumadin is on hold.    Patient currently on argatroban and Coumadin.  Her INR is 4.2 today.  Pharmacy to make decisions on when to stop argatroban.  Unfortunately due to her BMI, patient is not a candidate of DOAC , and if she has real AT3 deficiency then she surely is not a good candidate for heparin products (however we are not sure if AT3 deficiency is real since heparin can reduce the levels 30% from normal even 10 days after stopping whereas we checked her levels right after stopping the heparin), having said that, the only option left for [place] to treat her thromboembolism is Coumadin.  Once again, it is not confirmed whether her skin necrosis/ecchymosis is secondary to calciphylaxis or Coumadin induced skin necrosis since the timing does not match between starting of Coumadin and initiation of  necrosis/ecchymosis.  Her INR is now over 4 so her argatroban will be discontinued and her INR will need to be repeated 6 hours and 24 hours post stoppage per pharmacy.  Acute on chronic anemia:  Possibly due to acute blood loss.  Hemoglobin dropped up to 6.6.  Seems to be total 2 unit of PRBC transfusion  so far.  Hemoglobin 7.1.  Repeat CBC later today and tomorrow morning.  Transfuse if less than 7.  AKI: Nephrology team is managing.   This is likely multifactorial (will defer to the nephrology team)  -TDC placed on 07/25/2019 for renal replacement therapy given.had 951 mL urine output in last 24 hours.  Creatinine stable.  Further management deferred to nephrology.  Dialysis started on 07/25/2019.  Last dialysis was 07/29/2019.     Essential hypertension:  Blood pressure stable.  Monitor closely.  GERD:  -On PPI.    Mood disorder:  Stable. -Continue home medication  Morbid obesity: BMI 104 -Needs lifestyle change to lose weight. -May benefit from bariatric surgery outpatient.  Hypokalemia: Resolved.  DVT prophylaxis: On argatroban Code Status: Full code Family Communication: No family present at bedside.  Plan of care discussed with patient in detail. Disposition Plan: PT OT recommends SNF however patient wants to go home with home health.  I was informed yesterday that home health agency has refused to accept her due to her weight and the fact that PT is recommending SNF.  Patient is aware of that and still wants to go home.  She is requesting wound care supplies.  I have informed social Development worker, community to help her with that if possible.  They will also help her making an appointment to see wound care doctor as an outpatient. Consultants: Nephrology, vascular surgery   Subjective: Patient seen and examined.  She has no complaints.  According to her, her wounds are improving.  She had a discussion with our case manager yesterday where she was informed that home health agency has refused to accept her and that due to not having insurance, SNF might be difficult placement.  She understands that and is still desires to go home as long as she has proper dressing change supplies and she has an appointment with wound care MD.  Objective: Vitals:   08/04/19 1920 08/04/19 1944 08/05/19 0400 08/05/19 0805  BP: (!) 116/50  (!) 116/46 (!) 117/46  Pulse: 92 90 94 88  Resp:  17 17 18   Temp:  (!) 97.5 F (36.4 C) 97.9 F (36.6 C) 97.7 F (36.5 C)  TempSrc:  Oral Oral Oral  SpO2:  93% 100% 96%  Weight:      Height:        Intake/Output Summary (Last 24 hours) at 08/05/2019 1320 Last data filed at 08/05/2019 1007 Gross per 24 hour  Intake 489.8 ml  Output 550 ml  Net -60.2 ml   Filed Weights   07/26/19  0021 07/29/19 1232 07/29/19 1643  Weight: (!) 282.3 kg (!) 283 kg (!) 278 kg    Examination:  General exam: Appears calm and comfortable, morbidly obese Respiratory system: Clear to auscultation. Respiratory effort normal. Cardiovascular system: S1 & S2 heard, RRR. No JVD, murmurs, rubs, gallops or clicks. No pedal edema. Gastrointestinal system: Abdomen is nondistended, soft and nontender. No organomegaly or masses felt. Normal bowel sounds heard. Central nervous system: Alert and oriented. No focal neurological deficits. Extremities: Symmetric 5 x 5 power. Skin: Dressing in right lower extremity. Psychiatry: Judgement and insight appear normal. Mood & affect appropriate.   Procedures:  9/11-TDC  Microbiology summarized: SARS-CoV-2 negative. Blood cultures negative.  Antimicrobials: Anti-infectives (From admission, onward)   Start     Dose/Rate Route Frequency Ordered Stop   07/25/19 2200  ceFAZolin (ANCEF) IVPB 2g/100 mL premix  Status:  Discontinued     2 g 200 mL/hr over 30  Minutes Intravenous Every 24 hours 07/25/19 1311 07/25/19 1333   07/25/19 2200  ceFAZolin (ANCEF) IVPB 2g/100 mL premix  Status:  Discontinued     2 g 200 mL/hr over 30 Minutes Intravenous Every 24 hours 07/25/19 1334 07/31/19 0813   07/25/19 1345  ceFAZolin (ANCEF) IVPB 2g/100 mL premix  Status:  Discontinued     2 g 200 mL/hr over 30 Minutes Intravenous Every 12 hours 07/25/19 1333 07/25/19 1334   07/25/19 1100  ceFAZolin (ANCEF) IVPB 2g/100 mL premix  Status:  Discontinued     2 g 200 mL/hr over 30 Minutes Intravenous Every 12 hours 07/25/19 1023 07/25/19 1311   07/22/19 1730  cefTRIAXone (ROCEPHIN) 2 g in sodium chloride 0.9 % 100 mL IVPB  Status:  Discontinued     2 g 200 mL/hr over 30 Minutes Intravenous Every 24 hours 07/22/19 1704 07/24/19 1314   07/20/19 0615  vancomycin (VANCOCIN) 1,500 mg in sodium chloride 0.9 % 500 mL IVPB  Status:  Discontinued     1,500 mg 250 mL/hr over 120 Minutes  Intravenous Every 8 hours 07/20/19 0610 07/22/19 0825   07/19/19 2200  Vancomycin (VANCOCIN) 1,500 mg in sodium chloride 0.9 % 500 mL IVPB  Status:  Discontinued     1,500 mg 250 mL/hr over 120 Minutes Intravenous Every 8 hours 07/19/19 1846 07/20/19 0606   07/19/19 2130  piperacillin-tazobactam (ZOSYN) IVPB 3.375 g  Status:  Discontinued     3.375 g 100 mL/hr over 30 Minutes Intravenous  Once 07/19/19 2122 07/19/19 2127   07/19/19 2130  vancomycin (VANCOCIN) IVPB 1000 mg/200 mL premix  Status:  Discontinued     1,000 mg 200 mL/hr over 60 Minutes Intravenous  Once 07/19/19 2122 07/19/19 2127   07/19/19 2130  piperacillin-tazobactam (ZOSYN) IVPB 3.375 g  Status:  Discontinued     3.375 g 12.5 mL/hr over 240 Minutes Intravenous Every 8 hours 07/19/19 1846 07/22/19 1704   07/19/19 1230  vancomycin (VANCOCIN) IVPB 1000 mg/200 mL premix     1,000 mg 200 mL/hr over 60 Minutes Intravenous Every 1 hr x 2 07/19/19 1215 07/19/19 1523   07/19/19 1230  piperacillin-tazobactam (ZOSYN) IVPB 3.375 g     3.375 g 12.5 mL/hr over 240 Minutes Intravenous  Once 07/19/19 1215 07/19/19 1323      Sch Meds:  Scheduled Meds:  Chlorhexidine Gluconate Cloth  6 each Topical Q0600   influenza vac split quadrivalent PF  0.5 mL Intramuscular Tomorrow-1000   montelukast  10 mg Oral Daily   nortriptyline  25 mg Oral QHS   pantoprazole  40 mg Oral BID AC   sertraline  100 mg Oral BID   sevelamer carbonate  1,600 mg Oral TID WC   topiramate  50 mg Oral Daily   Warfarin - Pharmacist Dosing Inpatient   Does not apply q1800   Continuous Infusions:  sodium chloride     argatroban 0.03 mcg/kg/min (08/05/19 0703)   PRN Meds:.sodium chloride, acetaminophen **OR** acetaminophen, albuterol, ondansetron **OR** ondansetron (ZOFRAN) IV, oxyCODONE, sodium chloride flush   I have personally reviewed the following labs and images: CBC: Recent Labs  Lab 08/04/19 1022 08/05/19 0340  WBC 12.9* 12.3*  HGB 7.5*  7.1*  HCT 25.1* 23.0*  MCV 91.9 91.6  PLT 342 332   BMP &GFR Recent Labs  Lab 08/01/19 0734 08/02/19 0503 08/03/19 0533 08/04/19 1022 08/05/19 0340  NA 136 134* 135 136 135  K 3.6 3.8 4.2 3.8 3.9  CL 98 97* 97* 98  98  CO2 24 23 24 26 24   GLUCOSE 103* 102* 89 93 87  BUN 40* 50* 55* 61* 64*  CREATININE 5.76* 5.90* 5.58* 5.16* 5.37*  CALCIUM 9.2 9.3 9.2 9.2 9.2  PHOS 4.8* 5.4* 5.4* 5.3* 5.5*   Estimated Creatinine Clearance: 30.7 mL/min (A) (by C-G formula based on SCr of 5.37 mg/dL (H)). Liver & Pancreas: Recent Labs  Lab 07/30/19 0301 07/31/19 0315 08/01/19 0734 08/02/19 0503 08/03/19 0533 08/04/19 1022 08/05/19 0340  AST 9* 9* 10*  --   --   --   --   ALT <5 <5 <5  --   --   --   --   ALKPHOS 98 93 90  --   --   --   --   BILITOT 0.7 0.5 0.4  --   --   --   --   PROT 7.2 7.5 7.3  --   --   --   --   ALBUMIN 1.9* 2.0* 2.0* 1.9* 1.8* 1.9* 1.8*   No results for input(s): LIPASE, AMYLASE in the last 168 hours. No results for input(s): AMMONIA in the last 168 hours. Diabetic: No results for input(s): HGBA1C in the last 72 hours. Recent Labs  Lab 08/03/19 2112  GLUCAP 85   Cardiac Enzymes: No results for input(s): CKTOTAL, CKMB, CKMBINDEX, TROPONINI in the last 168 hours. No results for input(s): PROBNP in the last 8760 hours. Coagulation Profile: Recent Labs  Lab 08/01/19 0734 08/02/19 0503 08/03/19 0533 08/04/19 1022 08/05/19 0340  INR 2.4* 2.6* 3.2* 4.2* 4.0*   Thyroid Function Tests: No results for input(s): TSH, T4TOTAL, FREET4, T3FREE, THYROIDAB in the last 72 hours. Lipid Profile: No results for input(s): CHOL, HDL, LDLCALC, TRIG, CHOLHDL, LDLDIRECT in the last 72 hours. Anemia Panel: No results for input(s): VITAMINB12, FOLATE, FERRITIN, TIBC, IRON, RETICCTPCT in the last 72 hours. Urine analysis:    Component Value Date/Time   COLORURINE YELLOW 07/25/2019 0635   APPEARANCEUR TURBID (A) 07/25/2019 0635   LABSPEC 1.019 07/25/2019 0635    PHURINE 5.0 07/25/2019 0635   GLUCOSEU NEGATIVE 07/25/2019 0635   HGBUR MODERATE (A) 07/25/2019 0635   BILIRUBINUR NEGATIVE 07/25/2019 [place] 07/25/2019 0635   PROTEINUR 100 (A) 07/25/2019 0635   UROBILINOGEN 0.2 05/11/2013 1903   NITRITE NEGATIVE 07/25/2019 0635   LEUKOCYTESUR NEGATIVE 07/25/2019 0635   Sepsis Labs: Invalid input(s): PROCALCITONIN, [place]  Microbiology: No results found for this or any previous visit (from the past 240 hour(s)).  Radiology Studies: No results found.  Total time spent 28 minutes Ishmael Holter, M.D. [facility]  If 7PM-7AM, please contact night-coverage [email or web address] Password [email or web address] 08/05/2019, 1:20 PM

## 2019-08-05 NOTE — Progress Notes (Signed)
ANTICOAGULATION CONSULT NOTE - Follow Up Consult  Pharmacy Consult for Argatroban Indication: Recent DVT (05/24/19)  Allergies  Allergen Reactions  . Aspirin     Due to hx of stomach ulcers    Patient Measurements: Height: 5\' 5"  (165.1 cm) Weight: (!) 612 lb 14.1 oz (278 kg) IBW/kg (Calculated) : 57 Heparin Dosing Weight: 125.2 Actual weight recorded 278 kg  Vital Signs: Temp: 97.9 F (36.6 C) (09/22 0400) Temp Source: Oral (09/22 0400) BP: 116/46 (09/22 0400) Pulse Rate: 94 (09/22 0400)  Labs: Recent Labs    08/03/19 0533  08/04/19 1022 08/04/19 1551 08/04/19 2032 08/05/19 0340  HGB  --   --  7.5*  --   --  7.1*  HCT  --   --  25.1*  --   --  23.0*  PLT  --   --  342  --   --  332  APTT 95*   < > 95* 116* 79* 93*  LABPROT 32.6*  --  39.6*  --   --  38.4*  INR 3.2*  --  4.2*  --   --  4.0*  CREATININE 5.58*  --  5.16*  --   --  5.37*   < > = values in this interval not displayed.    Estimated Creatinine Clearance: 30.7 mL/min (A) (by C-G formula based on SCr of 5.37 mg/dL (H)).  Assessment: 43 yr old female on Coumadin PTA for hx DVT 05/24/19.  INR was supra therapeutic on admission and reversed with Vitamin K.  Pt was started on heparin but required maximum rates so was switched to argatroban for heparin resistance.  Pt does NOT have HIT.      aPTT 93 seconds on Argatroban at 0.04 mcg/kg/min.   Goal of Therapy:  APTT 50-90 sec INR 2-3 Monitor platelets by anticoagulation protocol: Yes    Plan:  Decrease Argatroban to 0.03 mcg/kg/min Next aPTT in 2h  Bertis Ruddy, PharmD Clinical Pharmacist Please check AMION for all [place] numbers 08/05/2019 5:40 AM

## 2019-08-05 NOTE — Progress Notes (Signed)
Physical Therapy Treatment Patient Details Name: Megan Ortiz MRN: [id number] DOB: 1976/02/21 Today's Date: 08/05/2019    History of Present Illness 43 yo female admitted to ED on 9/5 with RLE tenderness, swelling, and skin breakdown; recent hospitalization 06/02/19 for RLE DVT. Pt with RLE cellulitis vs post-thrombotic syndrome. Pt with renal failure, nephrology following and pt on dialysis with IJ HD port. PMH includes morbid obesity with BMI of 102, necrotizing fasciitis, anemia, anxiety, PE, tachycardia, I&D buttocks x2 in 2018.    PT Comments    Pt supine in bed on arrival she reports," Today is not a good day.  I don't feel like getting up. This would be easier at home."  Pt was agreeable to long sitting and supine exercises.  She refused to sit edge of bed and reports," This is all you are getting."  Pt required active assisted ROM during LE strengthening exercises.  Pt is slow and guarded and continues to presents with noticeable weakness and fatigue.  Based on presentation she continues to benefit from SNF placement for continued rehab and management and RLE dressing changes.    Follow Up Recommendations  SNF;Supervision/Assistance - 24 hour     Equipment Recommendations  None recommended by PT    Recommendations for Other Services       Precautions / Restrictions Precautions Precautions: Fall Restrictions Weight Bearing Restrictions: No Other Position/Activity Restrictions: WBAT    Mobility  Bed Mobility Overal bed mobility: Needs Assistance Bed Mobility: Supine to Sit;Sit to Supine     Supine to sit: Supervision     General bed mobility comments: Pt only mobilized to partial long sitting with LE slight advanced to edge of bed.  She refused to sit edge of bed and PTA instructed to return to supine so we could focus on supine exercises.  Transfers                 General transfer comment: Refused to mobilize this session.  Ambulation/Gait                  Stairs             Wheelchair Mobility    Modified Rankin (Stroke Patients Only)       Balance                                            Cognition Arousal/Alertness: Awake/alert Behavior During Therapy: Agitated(tearful during session and reports," this is all you are going to get."  Pt did not respond to encouragement and reports she has been having a bad day.) Overall Cognitive Status: Difficult to assess                                 General Comments: Pt appears depressed and tearful during session.      Exercises General Exercises - Lower Extremity Ankle Circles/Pumps: AROM;Both;20 reps;Supine Quad Sets: AROM;Both;10 reps;Supine Heel Slides: AAROM;Both;10 reps;Supine Hip ABduction/ADduction: Both;10 reps;Supine;AAROM(via pillow squeeze.) Straight Leg Raises: Supine;Both;10 reps;AAROM    General Comments        Pertinent Vitals/Pain Pain Assessment: Faces Faces Pain Scale: Hurts little more Pain Location: RLE, including lower leg and thigh Pain Descriptors / Indicators: Sore Pain Intervention(s): Limited activity within patient's tolerance    Home Living  Prior Function            PT Goals (current goals can now be found in the care plan section) Acute Rehab PT Goals Patient Stated Goal: To be left alone Potential to Achieve Goals: Good Progress towards PT goals: Not progressing toward goals - comment    Frequency    Min 2X/week      PT Plan Current plan remains appropriate    Co-evaluation              AM-PAC PT "6 Clicks" Mobility   Outcome Measure  Help needed turning from your back to your side while in a flat bed without using bedrails?: A Lot Help needed moving from lying on your back to sitting on the side of a flat bed without using bedrails?: A Lot Help needed moving to and from a bed to a chair (including a wheelchair)?: A Lot Help needed standing  up from a chair using your arms (e.g., wheelchair or bedside chair)?: A Lot Help needed to walk in hospital room?: Total Help needed climbing 3-5 steps with a railing? : Total 6 Click Score: 10    End of Session   Activity Tolerance: Treatment limited secondary to agitation(Pt was not angry agitated but you could since irritability with participation,) Patient left: in bed;with call bell/phone within reach Nurse Communication: Mobility status PT Visit Diagnosis: Other abnormalities of gait and mobility (R26.89);Difficulty in walking, not elsewhere classified (R26.2);Muscle weakness (generalized) (M62.81)     Time: 1131-1150 PT Time Calculation (min) (ACUTE ONLY): 19 min  Charges:  $Therapeutic Exercise: 8-22 mins                     Governor Rooks, PTA Acute Rehabilitation Services Pager [phone] Office [phone]     Rielyn Krupinski Eli Hose 08/05/2019, 12:25 PM

## 2019-08-05 NOTE — Progress Notes (Signed)
[facility] Progress Note    Assessment/ Plan:   43 year old lady with history of morbid obesity recent right lower extremity DVT July 2020. She been taking Coumadin she has a history of necrotizing fasciitis and debridements of the buttock and gluteal wound in 2018. She presented 07/19/2019 with right lower extremity pain induration ecchymosis and tenderness of the medial aspect of thigh. Not able to get a CT scan secondary to body habitus. She appears to have some acute kidney injury and nephrology was consulted.  Acute kidney injury. [place] without obstruction. Likely due to ATN / vanco. Avoid nephrotoxins ACE inhibitor's ARB use nonsteroidal anti-inflammatory drugs. Avoid IV contrast. Avoid intra-arterial procedures. Worsening renal function in the setting of anuria -started HD on Friday, 07/25/2019. Looks like urine output is picking up a little bit. Continue Foley catheter to quantify, Strict I/O. Will make daily determination for HD.  Last HD 9/15--> held 9/17, good response to IV Lasix, continue to hold HD and watch for renal recovery. Should be ok to remove the [facility] but slight uptick in Cr. Will likely slowly downtrend and recovery can be awhile, but fortunately no acute indications for HD.  Another possibility is to have follow up labs as outpt in a week and leave the tunneled catheter in until there are clear signs of recover.    Calciphylaxis/Coumadin necrosis. Aggressive wound care management. Will increase sevelamer to manage phosphorus. Stopped antibiotics. 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 Coumadin is being restarted -->there have been case reports of warfarin-induced skin necrosis in AT3 deficiency. Also contraindicated with calciphylaxis-- although this would be atypical to have calciphylaxis in the absence of ESRD.     Morbid obesity   RLE DVT. Continue anticoag-->on argatroban gtt now   Hyperphosphatemia.  Continue sevelamer.   Anemia. Status post PRBCs-- iron def but would avoid iron in setting of possible calciphylaxis  Subjective:   Feels better, denies f/c/n/v/dyspnea. Spouse bedside; thinks she's hallucinating speaking to ppl on phone.   Objective:   BP (!) 116/51 (BP Location: Right Arm) Comment: nurse notified  Pulse (!) 101 Comment: nurse notified  Temp 97.7 F (36.5 C) (Oral)   Resp 18   Ht 5\' 5"  (1.651 m)   Wt (!) 278 kg   LMP 06/18/2019   SpO2 98%   BMI 101.99 kg/m   Intake/Output Summary (Last 24 hours) at 08/05/2019 1621 Last data filed at 08/05/2019 1541 Gross per 24 hour  Intake 729.8 ml  Output 1000 ml  Net -270.2 ml   Weight change:   Physical Exam: General: AAOx3 NAD HEENT:edentulous Neck: No JVD CV: Heart RRR Lungs: Clear bilaterally no c/w/r Abd: abd SNT/ND with normal BS, morbidly obese Extremities: (+) deeply violaceous lesions right leg (dressed today), (+)2 edema ACCESS: R IJ TDC   Imaging: No results found.  Labs: BMET Recent Labs  Lab 07/30/19 0301 07/31/19 0315 08/01/19 0734 08/02/19 0503 08/03/19 0533 08/04/19 1022 08/05/19 0340  NA 135 135 136 134* 135 136 135  K 3.4* 3.5 3.6 3.8 4.2 3.8 3.9  CL 98 98 98 97* 97* 98 98  CO2 23 24 24 23 24 26 24   GLUCOSE 92 87 103* 102* 89 93 87  BUN 25* 32* 40* 50* 55* 61* 64*  CREATININE 4.45* 5.21* 5.76* 5.90* 5.58* 5.16* 5.37*  CALCIUM 8.8* 8.9 9.2 9.3 9.2 9.2 9.2  PHOS 4.1 4.5 4.8* 5.4* 5.4* 5.3* 5.5*   CBC Recent Labs  Lab 08/04/19 1022 08/05/19 0340  WBC 12.9* 12.3*  HGB 7.5* 7.1*  HCT 25.1* 23.0*  MCV 91.9 91.6  PLT 342 332    Medications:    . Chlorhexidine Gluconate Cloth  6 each Topical Q0600  . influenza vac split quadrivalent PF  0.5 mL Intramuscular Tomorrow-1000  . montelukast  10 mg Oral Daily  . nortriptyline  25 mg Oral QHS  . pantoprazole  40 mg Oral BID AC  . sertraline  100 mg Oral BID  . sevelamer carbonate  1,600 mg Oral TID WC  . topiramate   50 mg Oral Daily  . Warfarin - Pharmacist Dosing Inpatient   Does not apply q1800      Otelia Santee, MD 08/05/2019, 4:21 PM

## 2019-08-06 DIAGNOSIS — L03115 Cellulitis of right lower limb: Secondary | ICD-10-CM | POA: Diagnosis present

## 2019-08-06 DIAGNOSIS — S8011XA Contusion of right lower leg, initial encounter: Secondary | ICD-10-CM | POA: Diagnosis present

## 2019-08-06 LAB — RENAL FUNCTION PANEL
Albumin: 1.8 g/dL — ABNORMAL LOW (ref 3.5–5.0)
Anion gap: 13 (ref 5–15)
BUN: 72 mg/dL — ABNORMAL HIGH (ref 6–20)
CO2: 24 mmol/L (ref 22–32)
Calcium: 9 mg/dL (ref 8.9–10.3)
Chloride: 99 mmol/L (ref 98–111)
Creatinine, Ser: 5.05 mg/dL — ABNORMAL HIGH (ref 0.44–1.00)
GFR calc Af Amer: 11 mL/min — ABNORMAL LOW (ref >60)
GFR calc non Af Amer: 10 mL/min — ABNORMAL LOW (ref >60)
Glucose, Bld: 96 mg/dL (ref 70–99)
Phosphorus: 5.3 mg/dL — ABNORMAL HIGH (ref 2.5–4.6)
Potassium: 4.2 mmol/L (ref 3.5–5.1)
Sodium: 136 mmol/L (ref 135–145)

## 2019-08-06 LAB — PROTIME-INR
INR: 3.6 — ABNORMAL HIGH (ref 0.8–1.2)
Prothrombin Time: 35.2 seconds — ABNORMAL HIGH (ref 11.4–15.2)

## 2019-08-06 MED ORDER — WARFARIN SODIUM 1 MG PO TABS
1.0000 mg | ORAL_TABLET | Freq: Once | ORAL | Status: DC
  Filled 2019-08-06: qty 1

## 2019-08-06 MED ORDER — OXYCODONE HCL 5 MG PO TABS: 5.0000 mg | ORAL_TABLET | ORAL | 0 refills | Status: DC | PRN

## 2019-08-06 MED ORDER — WARFARIN SODIUM 2 MG PO TABS: 2.0000 mg | ORAL_TABLET | Freq: Every day | ORAL | 0 refills | Status: DC

## 2019-08-06 MED ORDER — WARFARIN SODIUM 1 MG PO TABS: 1.0000 mg | ORAL_TABLET | Freq: Once | ORAL | Status: DC

## 2019-08-06 MED FILL — oxyCODONE HCL 5 MG TABS: 5 | 2 days supply | Qty: 10 | Fill #0

## 2019-08-06 MED FILL — WARFARIN SODIUM 2 MG TABLET: 2 | 30 days supply | Qty: 30 | Fill #0

## 2019-08-06 NOTE — Progress Notes (Signed)
Notified Will [place], [place] of white area that has developed in wound of inner, upper thigh.  No further orders received at this time.

## 2019-08-06 NOTE — Progress Notes (Signed)
Dr. Doristine Bosworth notified by Cameron Sprang, RN of white are that has developed in wound on upper, inner thigh.  No further orders received at this time.

## 2019-08-06 NOTE — TOC Transition Note (Addendum)
Transition of Care [facility]) - CM/SW Discharge Note   Patient Details  Name: Megan Ortiz MRN: [id number] Date of Birth: March 01, 1976  [facility] CM/SW Contact:  Weston Anna, LCSW Phone Number: 08/06/2019, 9:59 AM   Clinical Narrative:     Patient is able to discharge home if medically stable. Patient aware no home health agency has been arranged due to her being uninsured and refusing PT's recommendation for SNF. Patient continues to want to return home a this time. CSW spoke with unit director, Ivin Booty, and unit will provide patient with some additional supplies for her wound.   Appointment scheduled at [facility] for 10/12 at 10:30 AM. Added to patients AVS  No other needs at this time.   Final next level of care: Home/Self Care Barriers to Discharge: No Barriers Identified   Patient Goals and CMS Choice Patient states their goals for this hospitalization and ongoing recovery are:: getting back home CMS Medicare.gov Compare Post Acute Care list provided to:: Patient Choice offered to / list presented to : Patient  Discharge Placement                Patient to be transferred to facility by: ptar Name of family member notified: patient Patient and family notified of of transfer: 08/06/19  Discharge Plan and Services In-house Referral: Clinical Social Work   Post Acute Care Choice: Home Health          DME Arranged: N/A         HH Arranged: NA          Social Determinants of Health ([place]) Interventions     Readmission Risk Interventions Readmission Risk Prevention Plan 06/03/2019  Transportation Screening Complete  PCP or Specialist Appt within 5-7 Days Complete  Home Care Screening Complete  Medication Review (RN CM) Complete  Some recent data might be hidden

## 2019-08-06 NOTE — Progress Notes (Signed)
Dr. Doristine Bosworth contacted by phone to notify that need instructions for wound care, and no further orders have been received by [facility] or surgical PA. Will Creig Hines.  Dr. Doristine Bosworth provided verbal order for [facility] consult.

## 2019-08-06 NOTE — Discharge Instructions (Signed)
Prothrombin Time, International Normalized Ratio Test Why am I having this test? A prothrombin time (pro-time, PT) test may be ordered if:  You have certain medical conditions that cause abnormal bleeding or blood clotting. These can include: ? Liver disease. ? Systemic infection (sepsis). ? Inherited (genetic) bleeding disorders.  You are taking a medicine to prevent excessive blood clotting (anticoagulant), such as warfarin. ? If you are taking warfarin, you will likely be asked to have this test done at regular intervals. The results of this test will help your health care provider determine what dose of warfarin you need based on how quickly or slowly your blood clots. It is very important to have this test done as often as your health care provider recommends. What is being tested? A prothrombin time (pro-time, PT) test measures how many seconds it takes your blood to clot. The international normalized ratio (INR) is a calculation of blood clotting time based on your PT result. Most labs report both PT and INR values when reporting blood clotting times. What kind of sample is taken?  A blood sample is required for this test. It is usually collected by inserting a needle into a blood vessel. Tell a health care provider about:  Any blood disorders you have.  All medicines you are taking, including vitamins, herbs, eye drops, creams, and over-the-counter medicines. Do not stop, add, or change any medicines without letting your health care provider know.  The foods you regularly eat, especially foods that contain moderate or high amounts of vitamin K. It is important to eat a consistent amount of foods rich in vitamin K. Let your health care provider know if you have recently changed your diet.  If you drink alcohol. This can affect your lab results. How are the results reported? Your test results will be reported as values. Your health care provider will compare your results to normal  ranges that were established after testing a large group of people (reference ranges). Reference ranges may vary among different labs and hospitals. For this test, common reference ranges are:  Without anticoagulant treatment (control value): 11.0-12.5 seconds; 85-100%.  INR: 0.8-1.1. If you are taking warfarin, talk with your health care provider about what your INR result should be. Generally, an INR of 2.0-3.0 is desired for blood clot prevention. This depends on your medical conditions. What do the results mean?  A higher than normal PT or INR means that your blood takes longer to form a clot. This can result from: ? Certain medicines. ? Liver disease. ? Lack of certain proteins that form clots (coagulation factors). ? Lack of some vitamins.  A lower than normal PT or INR means that your blood can form a clot easily. This can result from: ? Supplements that contain Vitamin K. ? Medicines that contain estrogen, such as birth control pills or hormone replacement. ? Cancer. ? Some blood disorders (disseminated intravascular coagulation). Talk with your health care provider about what your test results mean. If you are taking warfarin or another anticoagulant, your result ranges may be different. Talk to your health care provider about what your results should be. Questions to ask your health care provider Ask your health care provider or the department that is doing the test:  When will my results be ready?  How will I get my results?  What are my treatment options?  What other tests do I need?  What are my next steps? Summary  A prothrombin time (pro-time, PT) test measures how   many seconds it takes your blood to clot.  You may have this test if you have a medical condition that causes abnormal bleeding or blood clotting, or if you are taking a medicine to prevent abnormal blood clotting.  A test result that is higher than normal indicates that your blood is taking too long  to form a clot. This result may occur because you lack some vitamins, take certain medicines, or have certain medical conditions.  Talk with your health care provider about what your results mean. This information is not intended to replace advice given to you by your health care provider. Make sure you discuss any questions you have with your health care provider. Document Released: 12/02/2004 Document Revised: 12/15/2017 Document Reviewed: 12/15/2017 Elsevier Patient Education  2020 [place] INR CHECKED ON 08/08/19 AND ADJUST COUMADIN DOSE ACCORDINGLY. GOAL INR FOR YOU IS 2-3.

## 2019-08-06 NOTE — Progress Notes (Signed)
ANTICOAGULATION CONSULT NOTE - Follow Up Consult  Pharmacy Consult for Coumadin Indication: hx DVT (7/20)  Allergies  Allergen Reactions  . Aspirin     Due to hx of stomach ulcers    Patient Measurements: Height: 5\' 5"  (165.1 cm) Weight: (!) 612 lb 14.1 oz (278 kg) IBW/kg (Calculated) : 57  Vital Signs: Temp: 97.8 F (36.6 C) (09/23 0839) Temp Source: Oral (09/23 0839) BP: 106/29 (09/23 0839) Pulse Rate: 85 (09/23 0839)  Labs: Recent Labs    08/04/19 1022  08/04/19 2032 08/05/19 0340 08/05/19 0748 08/05/19 1512 08/05/19 2018 08/06/19 0306  HGB 7.5*  --   --  7.1*  --   --   --   --   HCT 25.1*  --   --  23.0*  --   --   --   --   PLT 342  --   --  332  --   --   --   --   APTT 95*   < > 79* 93* 121*  --   --   --   LABPROT 39.6*  --   --  38.4*  --  38.9* 37.0* 35.2*  INR 4.2*  --   --  4.0*  --  4.1* 3.8* 3.6*  CREATININE 5.16*  --   --  5.37*  --   --   --  5.05*   < > = values in this interval not displayed.    Estimated Creatinine Clearance: 32.6 mL/min (A) (by C-G formula based on SCr of 5.05 mg/dL (H)).  Assessment:  Anticoag: Heparin begun 9/8 for hx DVT (7/20). Changed to Argatroban on 9/15. Warfarin held on admit d/t INR 8.1 and question of warfarin necrosis. Coumadin resumed 9/18 with Argatroban bridging. INR>4 x 2. Per protocol, can d/c Argatroban if INR remains therapeutic after holding x 4 hrs. Repeat INR's have been 4.1 and 3.8  Goal of Therapy:  INR 2-3 Monitor platelets by anticoagulation protocol: Yes   Plan:  Argatroban has been D/cd AM INR is 3.6 after holding dose on 9/22 Will give Warfarin 1 mg x 1 dose.  Repeat INR in the morning    Duanne Limerick, PharmD, BCPS Clinical Pharmacist 08/06/2019,8:50 AM

## 2019-08-06 NOTE — Consult Note (Addendum)
[place] Nurse wound consult note Patient receiving care in [facility] 5N31. Reason for Consult: Right leg wound care Wound type: possibly coumadin induced Will Jennings, surgical PA, ordered Xeroform gauzes and kerlex several days ago.  I assessed the leg today and see no reason to change the topical therapy, so I have entered the following order:  Continue daily gently cleansing of left leg wounds with saline (or soap and water). Pat dry. Place Xeroform gauzes Kellie Simmering [phone]) over all open wounds. Wrap in kerlex. Change daily.  I have explained to the patient and her spouse that more skin will peel off.  I also described the wound care and dressing change process to both of them. In an ideal situation, the patient could be seen in an outpatient wound care center.  If at all possible, please try to secure this follow up for her. Monitor the wound area(s) for worsening of condition such as: Signs/symptoms of infection,  Increase in size,  Development of or worsening of odor, Development of pain, or increased pain at the affected locations.  Notify the medical team if any of these develop.  Thank you for the consult.  Discussed plan of care with the patient and bedside nurse.  [place] nurse will not follow at this time.  Please re-consult the [place] team if needed.  Val Riles, RN, MSN, CWOCN, CNS-BC, pager [phone]

## 2019-08-06 NOTE — Progress Notes (Signed)
I talked to Dr Augustin Coupe, Nephro, pt can go home with the tunneled HD cath and to see him in the office in 1 week.  RLE dressing changed just prior to discharge. Pt's husband was watching and verbalized that he can do the dressing changes at home. Discharge instructions was given to pt, discharged to home accompanied by her husband transported via private vehicle.

## 2019-08-06 NOTE — Discharge Summary (Signed)
Physician Discharge Summary  Megan Ortiz [id number] DOB: 10-Dec-1975 DOA: 07/19/2019  PCP: Larene Beach, MD  Admit date: 07/19/2019 Discharge date: 08/06/2019  Admitted From: Home Disposition: Home  Recommendations for Outpatient Follow-up:  1. Follow up with PCP in 1-2 weeks 2. Please do your dressing changes daily. 3. Please get your INR checked on Friday and adjust your Coumadin dose accordingly. 4. Please obtain BMP/CBC in one week 5. Please follow up on the following pending results:  Home Health: None Equipment/Devices: None  Discharge Condition: Stable CODE STATUS: Full code Diet recommendation: Cardiac  Subjective: Patient seen and examined.  Feels well.  She states that her wounds have improved significantly and that she is comfortable doing her dressing changes along with her husband.  She still desires to go home knowing that home health care agency has refused to accept her and she does not want to go to SNF.  Brief/Interim Summary: Patient is a 43 year old Caucasian female, morbidly obese,asthma, hiatal hernia, gastric ulcer over 10 years ago, history of PE with history of IVC filter and recent RLE DVT on 05/2019 on Coumadin, history of necrotizing fasciitis with debridement of buttock and gluteal wound in 2018.  Patient presented with RLE pain, induration, ecchymosis and tenderness over the medial aspect.  Not able to obtain CT due to body habitus.Ultrasound imaging did not show any underlying fluid collections. She was diagnosed to have right lower extremity cellulitis and  She was a started on vancomycin and Zosyn which she completed the course.  During the course of this hospitalization, patient has developed severe acute kidney injury.  Tunneled hemodialysis catheter has been placed, and patient has had 2 sessions of renal replacement therapy with the last being on 07/29/2019.  After that, patient's creatinine improved a little bit and then plateaued and has remained  stable around creatinine of 5 and patient has had good urine output so nephrology did not recommend any further dialysis.  There was also ?  Whether her skin ecchymosis/necrosis was secondary to Coumadin associated skin necrosis versus calciphylaxis.  Final proper diagnosis could not be established.  Due to her having history of DVT/PE in the past, she needed anticoagulation.  She was started on heparin drip however she was consuming too much of heparin without having to reach goal PTT.  Her Antithrombin III level was checked after stopping the heparin and she had slight deficiency however this was checked right after stopping the heparin so Antithrombin III level could be falsely low within 10 days of receiving the heparin.  Her protein C level was within normal limits.  She was then switched to argatroban and eventually was started back on Coumadin since due to her body habitus, she was not a good candidate for NOACs and there was no other alternative for her about Coumadin.  Subsequently, her INR became therapeutic and her argatroban was stopped on 08/05/2019.  Her INR today is 3.6.  On 07/29/2019, her wounds in the right lower extremity seem to worsen so general surgery was reconsulted and they recommended continuing wound care and no surgical intervention.  Wound care was provided to the patient on daily basis.  According to her, her wounds have improved significantly.  She was seen by PT OT and recommended SNF however patient wanted to go home with home health care.  Home health care agency then refused to accept her stating that she is a high risk.  This was informed to the patient however patient continued to remain adamant on going  home despite of not having any home health help.  She stated that she is quite capable of taking care of wounds with the help of her husband.  Due to AKI, her lisinopril was held and despite of that her blood pressure remained stable.  Due to persistent AKI and to avoid further  worsening and eventual leading to hemodialysis, I am discontinuing her lisinopril for good.  She will continue her diltiazem.  Her electrolytes are stable.  Nurse to coordinate with nephrology to see if they would like to remove her temporary dialysis catheter before discharge.  Discharge Diagnoses:  Principal Problem:   Cellulitis Active Problems:   Benign essential HTN   Morbid Obesity    ARF (acute renal failure) (HCC)   DVT (deep venous thrombosis) (HCC)   Anemia   Supratherapeutic INR   GERD (gastroesophageal reflux disease)   Hypokalemia   Cellulitis of right lower extremity   Traumatic ecchymosis of right lower leg    Discharge Instructions  Discharge Instructions    Discharge patient   Complete by: As directed    Discharge disposition: 01-Home or Self Care   Discharge patient date: 08/06/2019     Allergies as of 08/06/2019      Reactions   Aspirin    Due to hx of stomach ulcers      Medication List    STOP taking these medications   lisinopril 20 MG tablet Commonly known as: ZESTRIL     TAKE these medications   acetaminophen 500 MG tablet Commonly known as: TYLENOL Take 500 mg by mouth every 6 (six) hours as needed for moderate pain.   diltiazem 180 MG 24 hr capsule Commonly known as: TIAZAC Take 180 mg by mouth daily.   esomeprazole 20 MG capsule Commonly known as: NEXIUM Take 20 mg by mouth daily at 12 noon.   ferrous sulfate 325 (65 FE) MG tablet Take 1 tablet (325 mg total) by mouth 2 (two) times daily with a meal.   montelukast 10 MG tablet Commonly known as: SINGULAIR Take 10 mg by mouth daily.   nortriptyline 25 MG capsule Commonly known as: PAMELOR Take 25 mg by mouth at bedtime.   ondansetron 4 MG tablet Commonly known as: ZOFRAN Take 1 tablet (4 mg total) by mouth every 8 (eight) hours as needed for nausea or vomiting.   oxyCODONE 5 MG immediate release tablet Commonly known as: Oxy IR/ROXICODONE Take 1 tablet (5 mg total) by  mouth every 4 (four) hours as needed for up to 10 doses for moderate pain or severe pain.   pantoprazole 40 MG tablet Commonly known as: PROTONIX Take 1 tablet (40 mg total) by mouth 2 (two) times daily before a meal.   sertraline 100 MG tablet Commonly known as: ZOLOFT Take 1 tablet (100 mg total) by mouth 2 (two) times daily.   topiramate 50 MG tablet Commonly known as: TOPAMAX Take 50 mg by mouth daily.   Ventolin HFA 108 (90 Base) MCG/ACT inhaler Generic drug: albuterol Inhale 2 puffs into the lungs every 6 (six) hours as needed for wheezing.   warfarin 2 MG tablet Commonly known as: Coumadin Take 1 tablet (2 mg total) by mouth daily. Start taking on: August 07, 2019 What changed:   medication strength  how much to take  when to take this      Follow-up Information    Herber, Richard, MD Follow up in 1 week(s).   Specialty: Family Medicine Contact information: [address] [place] [address] [phone]          Allergies  Allergen Reactions  . Aspirin     Due to hx of stomach ulcers    Consultations: Nephrology   Procedures/Studies: X-ray Chest Pa Or Ap  Result Date: 07/25/2019 CLINICAL DATA:  Patient is rotated to the left. Recent dialysis catheter insertion EXAM: CHEST  1 VIEW COMPARISON:  Chest radiograph July 19, 2019 FINDINGS: Central venous catheter tip projects over the mediastinum, and sac location unable to be determined given patient rotation. Stable cardiomegaly. Similar bilateral interstitial pulmonary opacities. No pleural effusion or pneumothorax. IMPRESSION: New central venous catheter with tip projecting over the mediastinum, location of the tip unable to be exactly determine given marked patient rotation. Cardiomegaly and mild interstitial edema. Electronically Signed   By: Lovey Newcomer M.D.   On: 07/25/2019 18:52   US Renal  Result Date: 07/24/2019 CLINICAL DATA:  Acute kidney injury.  Hypertension. EXAM:  RENAL / URINARY TRACT ULTRASOUND COMPLETE COMPARISON:  05/25/2019 FINDINGS: Right Kidney: Renal measurements: 13.4 x 5.4 x 6.4 cm = volume: 240 mL . Echogenicity within normal limits. No mass or hydronephrosis visualized. Left Kidney: Renal measurements: 10.8 x 5.6 x 5.8 cm = volume: 183 mL. Echogenicity within normal limits. No mass or hydronephrosis visualized. Bladder: Not visualized. IMPRESSION: Stable slight size discrepancy right kidney larger than left. No hydronephrosis or focal mass. Electronically Signed   By: Marin Olp M.D.   On: 07/24/2019 12:24   Dg Chest Portable 1 View  Result Date: 07/19/2019 CLINICAL DATA:  Tachypnea EXAM: PORTABLE CHEST 1 VIEW COMPARISON:  05/25/2019 FINDINGS: Heart size appears enlarged. Lung volumes are low. Streaky right basilar opacity. There is mild pulmonary vascular congestion. No pneumothorax. IMPRESSION: 1. Cardiomegaly with mild pulmonary vascular congestion suggesting mild CHF/fluid overload. 2. Streaky right basilar opacity may reflect atelectasis and/or developing infection in the appropriate clinical setting. Electronically Signed   By: Davina Poke M.D.   On: 07/19/2019 12:33   Dg Fluoro Guide Cv Line-no Report  Result Date: 07/25/2019 Fluoroscopy was utilized by the requesting physician.  No radiographic interpretation.   Vas [place] Burnard Bunting With/wo Tbi  Result Date: 07/26/2019 LOWER EXTREMITY DOPPLER STUDY Indications: Swelling. High Risk Factors: None.  Limitations: Today's exam was limited due to involuntary patient movement and              patient body habitus. Comparison Study: No prior studies. Performing Technologist: Carlos Levering Rvt  Examination Guidelines: A complete evaluation includes at minimum, Doppler waveform signals and systolic blood pressure reading at the level of bilateral brachial, anterior tibial, and posterior tibial arteries, when vessel segments are accessible. Bilateral testing is considered an integral part of a complete  examination. Photoelectric Plethysmograph (PPG) waveforms and toe systolic pressure readings are included as required and additional duplex testing as needed. Limited examinations for reoccurring indications may be performed as noted.  ABI Findings: +---------+------------------+-----+--------+--------------+ Right    Rt Pressure (mmHg)IndexWaveformComment        +---------+------------------+-----+--------+--------------+ Brachial                                Restricted arm +---------+------------------+-----+--------+--------------+ PTA      61                1.52 biphasic               +---------+------------------+-----+--------+--------------+ DP       70  1.75 biphasic               +---------+------------------+-----+--------+--------------+ Great Toe42                1.05                        +---------+------------------+-----+--------+--------------+ +---------+------------------+-----+--------+----------------------------------+ Left     Lt Pressure (mmHg)IndexWaveformComment                            +---------+------------------+-----+--------+----------------------------------+ Brachial 40                     biphasicRadial pressure was taken due to                                           cuff displacement as a result of                                           patient body habitus               +---------+------------------+-----+--------+----------------------------------+ PTA      67                1.68 biphasic                                   +---------+------------------+-----+--------+----------------------------------+ DP       68                1.70 biphasic                                   +---------+------------------+-----+--------+----------------------------------+ Great Toe55                1.38                                             +---------+------------------+-----+--------+----------------------------------+ +-------+-----------+-----------+------------+------------+ ABI/TBIToday's ABIToday's TBIPrevious ABIPrevious TBI +-------+-----------+-----------+------------+------------+ Right  1.75       1.05                                +-------+-----------+-----------+------------+------------+ Left   1.7        1.38                                +-------+-----------+-----------+------------+------------+  Summary: Right: Resting right ankle-brachial index indicates noncompressible right lower extremity arteries. The right toe-brachial index is normal. Posterior tibial and dorsalis pedis artery pressures are likely incorrect due to cuff placement as a result of patient body habitus. Waveforms appear multiphasic, with great toe perfusion. Left: Resting left ankle-brachial index indicates noncompressible left lower extremity arteries. The left toe-brachial index is normal. Posterior tibial and dorsalis pedis artery pressures are likely incorrect due to cuff placement as a result of patient body habitus. Waveforms appear multiphasic, with great toe perfusion.  *  See table(s) above for measurements and observations.  Electronically signed by Servando Snare MD on 07/26/2019 at 12:17:26 AM.    Final    [place] Rt [place] Soft Tissue Non Vascular  Result Date: 07/20/2019 CLINICAL DATA:  Right lower extremity pain and swelling. EXAM: ULTRASOUND RIGHT LOWER EXTREMITY LIMITED TECHNIQUE: Ultrasound examination of the lower extremity soft tissues was performed in the area of clinical concern. COMPARISON:  May 24, 2019 FINDINGS: Limited right medial thigh sonographic evaluation demonstrates diffuse subcutaneous edema and skin thickening. No drainable fluid collection is seen. No solid masses are seen. IMPRESSION: The area of pain and swelling in the right medial lower extremity corresponds to diffuse cutaneous and subcutaneous edema.  No drainable fluid collections seen. Electronically Signed   By: Fidela Salisbury M.D.   On: 07/20/2019 18:09      Discharge Exam: Vitals:   08/06/19 0504 08/06/19 0839  BP: (!) 105/57 (!) 106/29  Pulse: 87 85  Resp: 20 18  Temp: 97.8 F (36.6 C) 97.8 F (36.6 C)  SpO2: 93% 100%   Vitals:   08/05/19 1346 08/05/19 1922 08/06/19 0504 08/06/19 0839  BP: (!) 116/51 (!) 104/55 (!) 105/57 (!) 106/29  Pulse: (!) 101 96 87 85  Resp: 18 18 20 18   Temp: 97.7 F (36.5 C) 98.4 F (36.9 C) 97.8 F (36.6 C) 97.8 F (36.6 C)  TempSrc: Oral Oral Oral Oral  SpO2: 98% 95% 93% 100%  Weight:      Height:        General: Pt is alert, awake, not in acute distress, morbidly obese Cardiovascular: RRR, S1/S2 +, no rubs, no gallops Respiratory: CTA bilaterally, no wheezing, no rhonchi Abdominal: Soft, NT, ND, bowel sounds + Skin: She has dressing in the right lower extremity.    The results of significant diagnostics from this hospitalization (including imaging, microbiology, ancillary and laboratory) are listed below for reference.     Microbiology: No results found for this or any previous visit (from the past 240 hour(s)).   Labs: BNP (last 3 results) No results for input(s): BNP in the last 8760 hours. Basic Metabolic Panel: Recent Labs  Lab 08/02/19 0503 08/03/19 0533 08/04/19 1022 08/05/19 0340 08/06/19 0306  NA 134* 135 136 135 136  K 3.8 4.2 3.8 3.9 4.2  CL 97* 97* 98 98 99  CO2 23 24 26 24 24   GLUCOSE 102* 89 93 87 96  BUN 50* 55* 61* 64* 72*  CREATININE 5.90* 5.58* 5.16* 5.37* 5.05*  CALCIUM 9.3 9.2 9.2 9.2 9.0  PHOS 5.4* 5.4* 5.3* 5.5* 5.3*   Liver Function Tests: Recent Labs  Lab 07/31/19 0315 08/01/19 0734 08/02/19 0503 08/03/19 0533 08/04/19 1022 08/05/19 0340 08/06/19 0306  AST 9* 10*  --   --   --   --   --   ALT <5 <5  --   --   --   --   --   ALKPHOS 93 90  --   --   --   --   --   BILITOT 0.5 0.4  --   --   --   --   --   PROT 7.5 7.3  --    --   --   --   --   ALBUMIN 2.0* 2.0* 1.9* 1.8* 1.9* 1.8* 1.8*   No results for input(s): LIPASE, AMYLASE in the last 168 hours. No results for input(s): AMMONIA in the last 168 hours. CBC: Recent Labs  Lab  08/04/19 1022 08/05/19 0340  WBC 12.9* 12.3*  HGB 7.5* 7.1*  HCT 25.1* 23.0*  MCV 91.9 91.6  PLT 342 332   Cardiac Enzymes: No results for input(s): CKTOTAL, CKMB, CKMBINDEX, TROPONINI in the last 168 hours. BNP: Invalid input(s): POCBNP CBG: Recent Labs  Lab 08/03/19 2112  GLUCAP 85   D-Dimer No results for input(s): DDIMER in the last 72 hours. Hgb A1c No results for input(s): HGBA1C in the last 72 hours. Lipid Profile No results for input(s): CHOL, HDL, LDLCALC, TRIG, CHOLHDL, LDLDIRECT in the last 72 hours. Thyroid function studies No results for input(s): TSH, T4TOTAL, T3FREE, THYROIDAB in the last 72 hours.  Invalid input(s): FREET3 Anemia work up No results for input(s): VITAMINB12, FOLATE, FERRITIN, TIBC, IRON, RETICCTPCT in the last 72 hours. Urinalysis    Component Value Date/Time   COLORURINE YELLOW 07/25/2019 0635   APPEARANCEUR TURBID (A) 07/25/2019 0635   LABSPEC 1.019 07/25/2019 0635   PHURINE 5.0 07/25/2019 0635   GLUCOSEU NEGATIVE 07/25/2019 0635   HGBUR MODERATE (A) 07/25/2019 0635   BILIRUBINUR NEGATIVE 07/25/2019 0635   KETONESUR NEGATIVE 07/25/2019 0635   PROTEINUR 100 (A) 07/25/2019 0635   UROBILINOGEN 0.2 05/11/2013 1903   NITRITE NEGATIVE 07/25/2019 0635   LEUKOCYTESUR NEGATIVE 07/25/2019 0635   Sepsis Labs Invalid input(s): PROCALCITONIN,  WBC,  LACTICIDVEN Microbiology No results found for this or any previous visit (from the past 240 hour(s)).   Time coordinating discharge: Over 30 minutes  SIGNED:   Darliss Cheney, MD  [facility] 08/06/2019, 9:53 AM Pager [phone]  If 7PM-7AM, please contact night-coverage [email or web address] Password [email or web address]

## 2019-08-06 NOTE — Progress Notes (Signed)
Physical Therapy Treatment Patient Details Name: Megan Ortiz MRN: [id number] DOB: February 13, 1976 Today's Date: 08/06/2019    History of Present Illness 43 yo female admitted to ED on 9/5 with RLE tenderness, swelling, and skin breakdown; recent hospitalization 06/02/19 for RLE DVT. Pt with RLE cellulitis vs post-thrombotic syndrome. Pt with renal failure, nephrology following and pt on dialysis with IJ HD port. PMH includes morbid obesity with BMI of 102, necrotizing fasciitis, anemia, anxiety, PE, tachycardia, I&D buttocks x2 in 2018.    PT Comments    Pt performed gt training and bed mobility/ transfers at a supervision level of assistance.  She continues to fatigue quickly and would benefit from SNF placement to improve strength and function.  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  Plan for follow up HHPT as she is refusing placement.    Follow Up Recommendations  SNF;Supervision/Assistance - 24 hour     Equipment Recommendations  None recommended by PT    Recommendations for Other Services       Precautions / Restrictions Precautions Precautions: Fall Precaution Comments: R IJ port for HD Restrictions Weight Bearing Restrictions: No Other Position/Activity Restrictions: WBAT    Mobility  Bed Mobility Overal bed mobility: Needs Assistance Bed Mobility: Supine to Sit     Supine to sit: Supervision     General bed mobility comments: Pt able to mobilize to edge of bed unassisted.  Transfers Overall transfer level: Needs assistance Equipment used: Rolling walker (2 wheeled) Transfers: Sit to/from Stand Sit to Stand: Supervision         General transfer comment: Cues for hand placement and supervision for safety.  Pt with poor eccentric loading to toilet but this is likely her baseline.  Ambulation/Gait Ambulation/Gait assistance: Supervision Gait Distance (Feet): 15 Feet Assistive device: Rolling walker (2  wheeled)(bariatric) Gait Pattern/deviations: Step-through pattern;Shuffle;Trunk flexed     General Gait Details: Pt with standing rest break x1 balance remains poor with heavy reliance on RW for support.   Stairs             Wheelchair Mobility    Modified Rankin (Stroke Patients Only)       Balance Overall balance assessment: Needs assistance Sitting-balance support: No upper extremity supported;Feet supported Sitting balance-Leahy Scale: Good       Standing balance-Leahy Scale: Poor                              Cognition Arousal/Alertness: Awake/alert Behavior During Therapy: Agitated Overall Cognitive Status: Difficult to assess                                 General Comments: Pt more motivated during session with husband present.      Exercises      General Comments        Pertinent Vitals/Pain Pain Assessment: Faces Faces Pain Scale: Hurts little more Pain Location: RLE, including lower leg and thigh Pain Descriptors / Indicators: Sore Pain Intervention(s): Monitored during session;Repositioned    Home Living                      Prior Function            PT Goals (current goals can now be found in the care plan section) Acute Rehab PT Goals Patient Stated Goal: to  go home Potential to Achieve Goals: Good Progress towards PT goals: Progressing toward goals    Frequency    Min 2X/week      PT Plan      Co-evaluation              AM-PAC PT "6 Clicks" Mobility   Outcome Measure  Help needed turning from your back to your side while in a flat bed without using bedrails?: A Little Help needed moving from lying on your back to sitting on the side of a flat bed without using bedrails?: A Little Help needed moving to and from a bed to a chair (including a wheelchair)?: A Little Help needed standing up from a chair using your arms (e.g., wheelchair or bedside chair)?: A Little Help needed to  walk in hospital room?: A Little Help needed climbing 3-5 steps with a railing? : A Lot 6 Click Score: 17    End of Session Equipment Utilized During Treatment: Gait belt Activity Tolerance: Patient tolerated treatment well Patient left: (sitting on commode attempting to void.) Nurse Communication: Mobility status PT Visit Diagnosis: Other abnormalities of gait and mobility (R26.89);Difficulty in walking, not elsewhere classified (R26.2);Muscle weakness (generalized) (M62.81)     Time: [id number] PT Time Calculation (min) (ACUTE ONLY): 13 min  Charges:  $Gait Training: 8-22 mins                     Governor Rooks, PTA Acute Rehabilitation Services Pager [phone] Office [phone]     Manasvini Whatley Eli Hose 08/06/2019, 12:05 PM

## 2019-08-10 ENCOUNTER — Emergency Department (HOSPITAL_COMMUNITY)
Admission: EM | Admit: 2019-08-10 | Discharge: 2019-08-10 | Disposition: A | Discharge status: Home / Self Care | Payer: Self-pay | Attending: Emergency Medicine | Admitting: Emergency Medicine

## 2019-08-10 ENCOUNTER — Other Ambulatory Visit: Payer: Self-pay

## 2019-08-10 ENCOUNTER — Encounter (HOSPITAL_COMMUNITY): Payer: Self-pay

## 2019-08-10 DIAGNOSIS — I10 Essential (primary) hypertension: Secondary | ICD-10-CM | POA: Insufficient documentation

## 2019-08-10 DIAGNOSIS — M79604 Pain in right leg: Secondary | ICD-10-CM | POA: Insufficient documentation

## 2019-08-10 DIAGNOSIS — I82591 Chronic embolism and thrombosis of other specified deep vein of right lower extremity: Secondary | ICD-10-CM | POA: Insufficient documentation

## 2019-08-10 DIAGNOSIS — Z5189 Encounter for other specified aftercare: Secondary | ICD-10-CM

## 2019-08-10 DIAGNOSIS — Z4800 Encounter for change or removal of nonsurgical wound dressing: Secondary | ICD-10-CM | POA: Insufficient documentation

## 2019-08-10 DIAGNOSIS — R791 Abnormal coagulation profile: Secondary | ICD-10-CM | POA: Insufficient documentation

## 2019-08-10 DIAGNOSIS — Z79899 Other long term (current) drug therapy: Secondary | ICD-10-CM | POA: Insufficient documentation

## 2019-08-10 LAB — COMPREHENSIVE METABOLIC PANEL
ALT: 6 U/L (ref 0–44)
AST: 11 U/L — ABNORMAL LOW (ref 15–41)
Albumin: 2.4 g/dL — ABNORMAL LOW (ref 3.5–5.0)
Alkaline Phosphatase: 74 U/L (ref 38–126)
Anion gap: 10 (ref 5–15)
BUN: 19 mg/dL (ref 6–20)
CO2: 27 mmol/L (ref 22–32)
Calcium: 9.8 mg/dL (ref 8.9–10.3)
Chloride: 105 mmol/L (ref 98–111)
Creatinine, Ser: 0.93 mg/dL (ref 0.44–1.00)
GFR calc Af Amer: >60 mL/min (ref >60)
GFR calc non Af Amer: >60 mL/min (ref >60)
Glucose, Bld: 110 mg/dL — ABNORMAL HIGH (ref 70–99)
Potassium: 4.6 mmol/L (ref 3.5–5.1)
Sodium: 142 mmol/L (ref 135–145)
Total Bilirubin: 0.4 mg/dL (ref 0.3–1.2)
Total Protein: 7.7 g/dL (ref 6.5–8.1)

## 2019-08-10 LAB — CBC WITH DIFFERENTIAL/PLATELET
Abs Immature Granulocytes: 0.36 10*3/uL — ABNORMAL HIGH (ref 0.00–0.07)
Basophils Absolute: 0.1 10*3/uL (ref 0.0–0.1)
Basophils Relative: 1 %
Eosinophils Absolute: 0.2 10*3/uL (ref 0.0–0.5)
Eosinophils Relative: 2 %
HCT: 27.6 % — ABNORMAL LOW (ref 36.0–46.0)
Hemoglobin: 7.8 g/dL — ABNORMAL LOW (ref 12.0–15.0)
Immature Granulocytes: 3 %
Lymphocytes Relative: 9 %
Lymphs Abs: 0.9 10*3/uL (ref 0.7–4.0)
MCH: 26.8 pg (ref 26.0–34.0)
MCHC: 28.3 g/dL — ABNORMAL LOW (ref 30.0–36.0)
MCV: 94.8 fL (ref 80.0–100.0)
Monocytes Absolute: 0.6 10*3/uL (ref 0.1–1.0)
Monocytes Relative: 5 %
Neutro Abs: 8.4 10*3/uL — ABNORMAL HIGH (ref 1.7–7.7)
Neutrophils Relative %: 80 %
Platelets: 466 10*3/uL — ABNORMAL HIGH (ref 150–400)
RBC: 2.91 MIL/uL — ABNORMAL LOW (ref 3.87–5.11)
RDW: 21.2 % — ABNORMAL HIGH (ref 11.5–15.5)
WBC: 10.5 10*3/uL (ref 4.0–10.5)
nRBC: 0.3 % — ABNORMAL HIGH (ref 0.0–0.2)

## 2019-08-10 LAB — PROTIME-INR
INR: 4.7 (ref 0.8–1.2)
Prothrombin Time: 43.6 seconds — ABNORMAL HIGH (ref 11.4–15.2)

## 2019-08-10 MED ORDER — ACETAMINOPHEN 500 MG PO TABS
1000.0000 mg | ORAL_TABLET | Freq: Once | ORAL | Status: AC
  Administered 2019-08-10: 1000 mg via ORAL
  Filled 2019-08-10: qty 2

## 2019-08-10 MED ORDER — OXYCODONE HCL 5 MG PO TABS
5.0000 mg | ORAL_TABLET | Freq: Once | ORAL | Status: AC
  Administered 2019-08-10: 13:00:00 5 mg via ORAL
  Filled 2019-08-10: qty 1

## 2019-08-10 MED ORDER — OXYCODONE HCL 5 MG PO TABS: 5.0000 mg | ORAL_TABLET | ORAL | 0 refills | Status: AC | PRN

## 2019-08-10 NOTE — ED Notes (Signed)
CRITICAL VALUE ALERT  Critical Value:  INR 4.7  Date & Time Notied:  08/10/2019, 1306  Provider Notified:  Rosemarie Ax PA  Orders Received/Actions taken: see chart

## 2019-08-10 NOTE — ED Triage Notes (Signed)
Pt reports was admitted for wound to r lower leg and recently discharged.  Pt c/o pain to r leg and requesting to have her PT/INR checked.

## 2019-08-10 NOTE — ED Provider Notes (Signed)
[facility] EMERGENCY DEPARTMENT Provider Note   CSN: [id number] Arrival date & time: 08/10/19  1025     History   Chief Complaint Chief Complaint  Patient presents with   Leg Pain    HPI Megan Ortiz is a 43 y.o. female with history of morbid obesity, asthma, PE s/p IVC filter, recent RLE DVT July 2020 on Coumadin, history of necrotizing fasciitis requiring debridement of buttock and gluteal wound presents to the ER for evaluation of wound check and requests check of PT/INR.  Patient states she was recently discharged from the hospital.  They put her back on Coumadin and told her to get the PT/INR checked this week but she has not been able to make an appointment with her primary care doctor to get this done so she would like it to be checked today.  She was told her PT INR goal was 4 but discharge paperwork documents PT INR goal of 2-3.  At discharge patient and husband declined SNF placement and opted to go home.  Husband has been caring for the wound and cleaning it/dressing it every day.  He is using saline to rinse and applying nonadherent dressing, gauze and Kerlix over it.  She reports ongoing right lower extremity Mi knee pain that is moderate to severe, significantly worse with ambulation, with dressing changes and palpation.  States the pain is about the same as it has been for several weeks and not any worse.  Husband wants to make sure that he is caring for the wound appropriately and would like it to be checked for an infection.  He thinks it looks well and is healing.  He denies noticing any increased redness, swelling, warmth or odor.  Patient denies any increased pain in the right leg.  No associated fever.  She has been otherwise doing well at home and denies any chest pain, shortness of breath.  States she has been trying to stay active at home and walk around but this makes her leg hurt more.  Admits that due to her pain and difficulty moving around she is concerned she may  not be able to go into see her primary care doctor.  Husband expresses concern about cost of obtaining medical care as she has no insurance.  Patient states that at discharge social work and case management spoke to her but they did not have many options for her due to her lack of insurance.       HPI  Past Medical History:  Diagnosis Date   Anemia    Anxiety    Asthma    DVT (deep venous thrombosis) (HCC)    Gastric ulcer    Headache    Hiatal hernia    Hypertension    PE (pulmonary embolism)    Tachycardia     Patient Active Problem List   Diagnosis Date Noted   Cellulitis of right lower extremity 08/06/2019   Traumatic ecchymosis of right lower leg 08/06/2019   Hypokalemia 08/01/2019   Cellulitis 07/19/2019   Supratherapeutic INR 07/19/2019   GERD (gastroesophageal reflux disease) 07/19/2019   Encephalopathy    ARF (acute renal failure) (HCC)    DVT (deep venous thrombosis) (HCC)    Anemia    Morbidly obese (HCC)    Abnormal CT scan, stomach    Leg DVT (deep venous thromboembolism), acute, right ([place]) 05/24/2019   Right leg pain 05/24/2019   Suspected Pulmonary embolus 05/24/2019   Community acquired pneumonia 05/24/2019   Positive D dimer  05/24/2019   Left lower lobe pneumonia ([place]) 05/24/2019   Hypotension 05/24/2019   Benign essential HTN    Uncomplicated asthma    Morbid Obesity     Adjustment disorder with mixed anxiety and depressed mood    Hyperglycemia    Leukocytosis    Acute blood loss anemia    Acute hypoxemic respiratory failure (HCC)    AKI (acute kidney injury) ([place])    Septic shock ([place]) 02/16/2017   Necrotizing soft tissue infection 02/16/2017   Generalized anxiety disorder 12/22/2014    Past Surgical History:  Procedure Laterality Date   CESAREAN SECTION     CHOLECYSTECTOMY     ESOPHAGOGASTRODUODENOSCOPY (EGD) WITH PROPOFOL N/A 05/25/2019   Procedure: ESOPHAGOGASTRODUODENOSCOPY (EGD) WITH  PROPOFOL;  Surgeon: [name], MD;  Location: [place];  Service: Endoscopy;  Laterality: N/A;   INCISION AND DRAINAGE ABSCESS Left 02/16/2017   Procedure: DEBRIDEMENT LEFT BUTTOCK ABSCESS;  Surgeon: Fanny Skates, MD;  Location: [place];  Service: General;  Laterality: Left;   INSERTION OF DIALYSIS CATHETER Right 07/25/2019   Procedure: INSERTION OF TUNNEL DIALYSIS CATHETER;  Surgeon: Waynetta Sandy, MD;  Location: [place];  Service: Vascular;  Laterality: Right;   IRRIGATION AND DEBRIDEMENT BUTTOCKS Left 02/19/2017   Procedure: DEBRIDEMENT OF GLUTEAL WOUND;  Surgeon: Rolm Bookbinder, MD;  Location: [place];  Service: General;  Laterality: Left;     OB History    Gravida  1   Para  1   Term  1   Preterm      AB      Living  1     SAB      TAB      Ectopic      Multiple      Live Births               Home Medications    Prior to Admission medications   Medication Sig Start Date End Date Taking? Authorizing Provider  acetaminophen (TYLENOL) 500 MG tablet Take 500 mg by mouth every 6 (six) hours as needed for moderate pain.    [provider]  albuterol (VENTOLIN HFA) 108 (90 BASE) MCG/ACT inhaler Inhale 2 puffs into the lungs every 6 (six) hours as needed for wheezing.    [provider]  diltiazem (TIAZAC) 180 MG 24 hr capsule Take 180 mg by mouth daily.    [provider]  esomeprazole (NEXIUM) 20 MG capsule Take 20 mg by mouth daily at 12 noon.    [provider]  ferrous sulfate 325 (65 FE) MG tablet Take 1 tablet (325 mg total) by mouth 2 (two) times daily with a meal. 06/03/19 08/02/19  Amin, Ankit Chirag, MD  montelukast (SINGULAIR) 10 MG tablet Take 10 mg by mouth daily.    [provider]  nortriptyline (PAMELOR) 25 MG capsule Take 25 mg by mouth at bedtime.    [provider]  ondansetron (ZOFRAN) 4 MG tablet Take 1 tablet (4 mg total) by mouth every 8 (eight) hours as needed for nausea or  vomiting. 03/09/17   Rai, Ripudeep K, MD  oxyCODONE (OXY IR/ROXICODONE) 5 MG immediate release tablet Take 1 tablet (5 mg total) by mouth every 4 (four) hours as needed for up to 3 days for severe pain. 08/10/19 08/13/19  Kinnie Feil, PA-C  pantoprazole (PROTONIX) 40 MG tablet Take 1 tablet (40 mg total) by mouth 2 (two) times daily before a meal. 06/03/19 08/02/19  Damita Lack, MD  sertraline (ZOLOFT) 100 MG tablet Take 1 tablet (100 mg total) by mouth 2 (two) times daily. 01/19/15   Cloria Spring, MD  topiramate (TOPAMAX) 50 MG tablet Take 50 mg by mouth daily. 01/24/17   [provider]  warfarin (COUMADIN) 2 MG tablet Take 1 tablet (2 mg total) by mouth daily. 08/07/19 09/06/19  Darliss Cheney, MD    Family History Family History  Problem Relation Age of Onset   Depression Mother    Alcohol abuse Mother     Social History Social History   Tobacco Use   Smoking status: Former Smoker    Types: Cigarettes   Smokeless tobacco: Never Used   Tobacco comment: QUIT ABOUT 5 YEARS AGO  Substance Use Topics   Alcohol use: Yes    Comment: occa   Drug use: No     Allergies   Aspirin   Review of Systems Review of Systems  Musculoskeletal: Positive for myalgias (RLE).  Skin: Positive for wound.  All other systems reviewed and are negative.    Physical Exam Updated Vital Signs BP 138/88 (BP Location: Right Wrist)    Pulse (!) 102    Temp (!) 97.5 F (36.4 C) (Oral)    Resp 18    Ht 5\' 5"  (1.651 m)    Wt (!) 278 kg    LMP 07/27/2019    SpO2 98%    BMI 101.99 kg/m   Physical Exam Vitals signs and nursing note reviewed.  Constitutional:      General: She is not in acute distress.    Appearance: She is well-developed.     Comments: NAD.  Morbidly obese.  Husband at bedside.  HENT:     Head: Normocephalic and atraumatic.     Right Ear: External ear normal.     Left Ear: External ear normal.     Nose: Nose normal.  Eyes:     General: No scleral  icterus.    Conjunctiva/sclera: Conjunctivae normal.  Neck:     Musculoskeletal: Normal range of motion and neck supple.  Cardiovascular:     Rate and Rhythm: Normal rate and regular rhythm.     Heart sounds: Normal heart sounds. No murmur.     Comments: 1+ DP pulses bilaterally.  Toes/feet well perfused and warm. Pulmonary:     Effort: Pulmonary effort is normal.     Breath sounds: Normal breath sounds.     Comments: Clear lung sounds to upper and middle lobes, diminished lung entry to the lower lobes, difficult exam due to body habitus.  Normal work of breathing.  No crackles. Musculoskeletal: Normal range of motion.        General: No deformity.     Comments: Patient able to lift her right leg and flex her knee and ankle without significant difficulty.  No focal bony tenderness to the knee or ankle or signs of joint effusion, edema, erythema, warmth.  No calf tenderness or asymmetry.  Skin:    General: Skin is warm and dry.     Capillary Refill: Capillary refill takes less than 2 seconds.     Comments: See photo below.  Right lower extremity has chronic appearing wounds medially.  Pain reported with removal of dressing.  Scabs noted in some areas with surrounding granular tissue.  No significant circumferential erythema, fluctuance, warmth or tenderness.  No streaking of erythema outwardly from the wounds.  When compared to previous photos on chart wound appears to be improving without signs of infection.  Neurological:     Mental Status: She is alert and oriented to person, place, and time.     Comments: Sensation to light touch and strength intact in lower extremities including ankles and great toes.  Psychiatric:        Behavior: Behavior normal.        Thought Content: Thought content normal.        Judgment: Judgment normal.        ED Treatments / Results  Labs (all labs ordered are listed, but only abnormal results are displayed) Labs Reviewed  PROTIME-INR - Abnormal;  Notable for the following components:      Result Value   Prothrombin Time 43.6 (*)    INR 4.7 (*)    All other components within normal limits  CBC WITH DIFFERENTIAL/PLATELET - Abnormal; Notable for the following components:   RBC 2.91 (*)    Hemoglobin 7.8 (*)    HCT 27.6 (*)    MCHC 28.3 (*)    RDW 21.2 (*)    Platelets 466 (*)    nRBC 0.3 (*)    Neutro Abs 8.4 (*)    Abs Immature Granulocytes 0.36 (*)    All other components within normal limits  COMPREHENSIVE METABOLIC PANEL - Abnormal; Notable for the following components:   Glucose, Bld 110 (*)    Albumin 2.4 (*)    AST 11 (*)    All other components within normal limits    EKG None  Radiology No results found.  Procedures Procedures (including critical care time)  Medications Ordered in ED Medications  oxyCODONE (Oxy IR/ROXICODONE) immediate release tablet 5 mg (5 mg Oral Given 08/10/19 1249)  acetaminophen (TYLENOL) tablet 1,000 mg (1,000 mg Oral Given 08/10/19 1249)     Initial Impression / Assessment and Plan / ED Course  I have reviewed the triage vital signs and the nursing notes.  Pertinent labs & imaging results that were available during my care of the patient were reviewed by me and considered in my medical decision making (see chart for details).  Clinical Course as of Aug 09 1409  Sun Aug 10, 2019  1245 INR(!!): 4.7 [CG]  1245 Hemoglobin(!): 7.8 [CG]  1245 HCT(!): 27.6 [CG]  1315 I spoke to pharmacist regarding PT/INR elevated today, recommends holding Coumadin for 1 day and rechecking PT/INR in 24 hours.  Several factors affecting PT/INR including sensitivity to it, weight, dosage.   [CG]    Clinical Course User Index [CG] Kinnie Feil, PA-C    Patient is EMR reviewed to obtain pertinent PMH.  I reviewed her last discharge paperwork from hospitalization 9/5-9/23.  She was admitted for right lower extremity pain from wounds, suspected cellulitis.  Her PT/INR was significantly elevated.   There was concern for deeper soft tissue infection given her history of necrotizing fasciitis requiring debridement.  Unfortunately, due to body habitus patient unable to obtain CT here at [facility] and at [facility].  Ultrasound was done did not show any abnormalities.  General surgery evaluated the patient did not think there was [employer].  She was treated with IV antibiotics for suspected cellulitis.  Wounds were thought to be from either Coumadin toxicity or calciphylaxis.  During hospitalization she had severe acute kidney injury and required temporary renal replacement therapy.  Nephrology eventually discontinued this and did not recommend ongoing dialysis/replacement therapy.  She declined SNF placement at discharge.  ER work up reviewed by me.   No signs of superimposed infection of  wound today.  Wound appears significantly better than from previous photos.  She has no constitutional symptoms.  Extremity is NVI. No signs of proximal/distal joint involvement.  Compartments soft. No leukocytosis.  No evidence of abscess or cellulitis or crepitus to suggest necrotizing fasciitis.  She has chronic pain in the wounds but she denies any increase of the pain.  I do not think further emergent imaging or treatment is indicated for the wounds today.  Dressing changed here.  Encouraged continued close dressing changes and observation.  PT/INR today is 4.6.  Patient states her goal is for but when I reviewed chart pharmacy documents goal of 2-3.  She does not have any signs of acute bleeding.  Her chronic anemia is actually better than previous.  I spoke to pharmacist regarding PT/INR and Coumadin dosing.  Recommends holding it for 1 day and rechecking PT/INR in 24 hours.  Several factors can affect PT/INR and patient was instructed to have close follow-up in 24 hours for recheck.  Her creatinine was normal today.  Patient reassess. She requested something for pain.  She was discharged with pain med but  states she was cautiously using them and ran out.  She is not frequently in the ER.  I reviewed narcotic database.  I will give her the benefit of the doubt and prescribe her one short course of oxycodone.  I also encouraged Tylenol.  Discussed with her risks of ongoing oxycodone use and she is aware that we will not be able to refill these here in the ER.  PCP follow-up recommended.  Patient and husband expressed difficulty obtaining and affording medical care.  Due to her morbid obesity she has a difficult time getting to PCP appointments.  Encouraged her to contact her PCP and do telemedicine visit.  Return precautions given.  Patient and husband comfortable with this.  Final Clinical Impressions(s) / ED Diagnoses   Final diagnoses:  Right leg pain  Wound check, abscess  Elevated INR    ED Discharge Orders         Ordered    oxyCODONE (OXY IR/ROXICODONE) 5 MG immediate release tablet  Every 4 hours PRN     08/10/19 1341           Kinnie Feil, PA-C 08/10/19 1410    Fredia Sorrow, MD 08/23/19 0900

## 2019-08-10 NOTE — ED Notes (Signed)
Pt reports she has been out of hospital for 3 days   Her leg is dressed and appears to not have been changed since discharge  Pt is morbidly obese and appears unlikely that she can change dressing due to her size

## 2019-08-10 NOTE — ED Notes (Signed)
Pt returns having been told by clinician to get an INR "they didn't tell me where I could get one"  Came here   Also reports leg pain and wonders if she might get pain meds

## 2019-08-10 NOTE — Discharge Instructions (Signed)
Wound today looks to be healing well with no signs of infection.  Your PT/INR today is 4.6.  I reviewed your recent hospital discharge paperwork and your PT INR goal is 2-3.  Your PT/INR today is elevated.  Please hold your Coumadin for 24 hours.  Your PT/INR needs to be rechecked in 24 hours to make sure it has gone down.  Return to the ER if there is any signs of bleeding including blood in your urine, blood in your stool, black stools, bleeding from the wound, vaginal bleeding.  Continue your dressing changes at home.  For pain take acetaminophen.  Take [phone] mg acetaminophen (tylenol) every 6 hours.  Do not exceed 4,000 mg acetaminophen in a 24 hour period.  Do not take ibuprofen containing products if you have history of kidney disease, ulcers, GI bleeding, severe acid reflux, or take a blood thinner.  Do not take acetaminophen if you have liver disease. For break through and/or severe pain despite ibuprofen and acetaminophen regimen, take 5 mg oxycodone every 4 hours.  Oxycodone is a narcotic pain medication that has risk of overdose, death, dependence and abuse. Mild and expected side effects include nausea, stomach upset, drowsiness, constipation. Do not consume alcohol, drive or use heavy machinery while taking this medication. Do not leave unattended around children. Flush any remaining pills that you do not use and do not share.  The emergency department has a strict policy regarding prescription of narcotic medications. We prescribe a short course for acute, new pain or injuries. We are unable to refill this medication in the emergency department for chronic pain or repeatedly.  Refill need to be done by specialist or primary care provider or pain clinic.  Contact your primary care provider or specialist for chronic pain management and refill on narcotic medications.

## 2019-08-10 NOTE — ED Notes (Signed)
Pt returns for eval of R leg pain   She was recently discharged has follow up appt with wound care at [facility]

## 2019-08-10 NOTE — ED Notes (Signed)
PA in to assess 

## 2019-08-17 ENCOUNTER — Encounter (HOSPITAL_COMMUNITY): Payer: Self-pay

## 2019-08-17 ENCOUNTER — Other Ambulatory Visit: Payer: Self-pay

## 2019-08-17 ENCOUNTER — Emergency Department (HOSPITAL_COMMUNITY)
Admission: EM | Admit: 2019-08-17 | Discharge: 2019-08-17 | Disposition: A | Discharge status: Home / Self Care | Payer: Self-pay | Attending: Emergency Medicine | Admitting: Emergency Medicine

## 2019-08-17 DIAGNOSIS — J45909 Unspecified asthma, uncomplicated: Secondary | ICD-10-CM | POA: Insufficient documentation

## 2019-08-17 DIAGNOSIS — I10 Essential (primary) hypertension: Secondary | ICD-10-CM | POA: Insufficient documentation

## 2019-08-17 DIAGNOSIS — Z452 Encounter for adjustment and management of vascular access device: Secondary | ICD-10-CM | POA: Insufficient documentation

## 2019-08-17 DIAGNOSIS — Z7984 Long term (current) use of oral hypoglycemic drugs: Secondary | ICD-10-CM | POA: Insufficient documentation

## 2019-08-17 DIAGNOSIS — Z79899 Other long term (current) drug therapy: Secondary | ICD-10-CM | POA: Insufficient documentation

## 2019-08-17 DIAGNOSIS — L03115 Cellulitis of right lower limb: Secondary | ICD-10-CM | POA: Insufficient documentation

## 2019-08-17 DIAGNOSIS — Z87891 Personal history of nicotine dependence: Secondary | ICD-10-CM | POA: Insufficient documentation

## 2019-08-17 HISTORY — DX: Necrotizing fasciitis: M72.6

## 2019-08-17 LAB — I-STAT CHEM 8, ED
BUN: 3 mg/dL — ABNORMAL LOW (ref 6–20)
Calcium, Ion: 1.33 mmol/L (ref 1.15–1.40)
Chloride: 102 mmol/L (ref 98–111)
Creatinine, Ser: 0.6 mg/dL (ref 0.44–1.00)
Glucose, Bld: 111 mg/dL — ABNORMAL HIGH (ref 70–99)
HCT: 31 % — ABNORMAL LOW (ref 36.0–46.0)
Hemoglobin: 10.5 g/dL — ABNORMAL LOW (ref 12.0–15.0)
Potassium: 3.7 mmol/L (ref 3.5–5.1)
Sodium: 140 mmol/L (ref 135–145)
TCO2: 23 mmol/L (ref 22–32)

## 2019-08-17 MED ORDER — LIDOCAINE HCL (PF) 1 % IJ SOLN
5.0000 mL | Freq: Once | INTRAMUSCULAR | Status: AC
  Administered 2019-08-17: 5 mL
  Filled 2019-08-17: qty 6

## 2019-08-17 NOTE — ED Triage Notes (Signed)
Pt was discharged from hospital 2 weeks ago. She had a permacath placed while in the hospital for dialysis. Permacath is still in place and pt wants removed. Pt states it hurts

## 2019-08-17 NOTE — ED Provider Notes (Signed)
[facility] EMERGENCY DEPARTMENT Provider Note   CSN: [id number] Arrival date & time: 08/17/19  1100     History   Chief Complaint Chief Complaint  Patient presents with  . Vascular Access Problem    HPI Megan Ortiz is a 43 y.o. female with a history significant for HTN, PE with IVC filter, dvt RLE 7/20 on coumadin, gastric ulcers, morbid obesity, necrotizing fasciitis and recent hospitalization for right lower extremity cellulitis during which time she also developed acute renal failure, had a tunneled hemodialysis catheter placed in her right chest wall and had 2 dialysis treatments during the hospitalization.  The catheter was left in place with plans to recheck a creatinine as an OP in a week (dc from hospital 08/06/19) and if creatinine stable, could consider dc of the catheter. She has been unable to followup with Dr. Augustin Coupe of nephrology as was recommended.  She presents today for catheter removal, stating it hurts, she keeps catching it on her clothing and she wants it removed. She denies fevers, chills or other complaints.  She left the hospital with ulcerated wounds on her right medial lower and upper leg.  She and her husband have been changing the dressings daily and using saline to rinse followed by vaseline application as they ran out of the xeroform they were advised to use.  She reports it has been continuing to improve with no significant drainage, no increased pain or odor, no expanding lesions.     The history is provided by the patient.    Past Medical History:  Diagnosis Date  . Anemia   . Anxiety   . Asthma   . DVT (deep venous thrombosis) ([place])   . Gastric ulcer   . Headache   . Hiatal hernia   . Hypertension   . Necrotizing fasciitis ([place])   . PE (pulmonary embolism)   . Tachycardia     Patient Active Problem List   Diagnosis Date Noted  . Cellulitis of right lower extremity 08/06/2019  . Traumatic ecchymosis of right lower leg 08/06/2019  .  Hypokalemia 08/01/2019  . Cellulitis 07/19/2019  . Supratherapeutic INR 07/19/2019  . GERD (gastroesophageal reflux disease) 07/19/2019  . Encephalopathy   . ARF (acute renal failure) ([place])   . DVT (deep venous thrombosis) ([place])   . Anemia   . Morbidly obese ([place])   . Abnormal CT scan, stomach   . Leg DVT (deep venous thromboembolism), acute, right ([place]) 05/24/2019  . Right leg pain 05/24/2019  . Suspected Pulmonary embolus 05/24/2019  . Community acquired pneumonia 05/24/2019  . Positive D dimer 05/24/2019  . Left lower lobe pneumonia 05/24/2019  . Hypotension 05/24/2019  . Benign essential HTN   . Uncomplicated asthma   . Morbid Obesity    . Adjustment disorder with mixed anxiety and depressed mood   . Hyperglycemia   . Leukocytosis   . Acute blood loss anemia   . Acute hypoxemic respiratory failure ([place])   . AKI (acute kidney injury) ([place])   . Septic shock ([place]) 02/16/2017  . Necrotizing soft tissue infection 02/16/2017  . Generalized anxiety disorder 12/22/2014    Past Surgical History:  Procedure Laterality Date  . CESAREAN SECTION    . CHOLECYSTECTOMY    . ESOPHAGOGASTRODUODENOSCOPY (EGD) WITH PROPOFOL N/A 05/25/2019   Procedure: ESOPHAGOGASTRODUODENOSCOPY (EGD) WITH PROPOFOL;  Surgeon: [name], MD;  Location: [facility] ENDOSCOPY;  Service: Endoscopy;  Laterality: N/A;  . INCISION AND DRAINAGE ABSCESS Left 02/16/2017   Procedure: DEBRIDEMENT LEFT  BUTTOCK ABSCESS;  Surgeon: Fanny Skates, MD;  Location: [place];  Service: General;  Laterality: Left;  . INSERTION OF DIALYSIS CATHETER Right 07/25/2019   Procedure: INSERTION OF TUNNEL DIALYSIS CATHETER;  Surgeon: Waynetta Sandy, MD;  Location: [place];  Service: Vascular;  Laterality: Right;  . IRRIGATION AND DEBRIDEMENT BUTTOCKS Left 02/19/2017   Procedure: DEBRIDEMENT OF GLUTEAL WOUND;  Surgeon: Rolm Bookbinder, MD;  Location: [place];  Service: General;  Laterality: Left;     OB History    Gravida  1   Para   1   Term  1   Preterm      AB      Living  1     SAB      TAB      Ectopic      Multiple      Live Births               Home Medications    Prior to Admission medications   Medication Sig Start Date End Date Taking? Authorizing Provider  acetaminophen (TYLENOL) 500 MG tablet Take 500 mg by mouth every 6 (six) hours as needed for moderate pain.   Yes [provider]  albuterol (VENTOLIN HFA) 108 (90 BASE) MCG/ACT inhaler Inhale 2 puffs into the lungs every 6 (six) hours as needed for wheezing.   Yes [provider]  diltiazem (TIAZAC) 180 MG 24 hr capsule Take 180 mg by mouth daily.   Yes [provider]  esomeprazole (NEXIUM) 20 MG capsule Take 20 mg by mouth daily at 12 noon.   Yes [provider]  montelukast (SINGULAIR) 10 MG tablet Take 10 mg by mouth daily.   Yes [provider]  nortriptyline (PAMELOR) 25 MG capsule Take 25 mg by mouth at bedtime.   Yes [provider]  ondansetron (ZOFRAN) 4 MG tablet Take 1 tablet (4 mg total) by mouth every 8 (eight) hours as needed for nausea or vomiting. 03/09/17  Yes Rai, Ripudeep K, MD  pantoprazole (PROTONIX) 40 MG tablet Take 1 tablet (40 mg total) by mouth 2 (two) times daily before a meal. 06/03/19 08/17/19 Yes Amin, Ankit Chirag, MD  sertraline (ZOLOFT) 100 MG tablet Take 1 tablet (100 mg total) by mouth 2 (two) times daily. 01/19/15  Yes Cloria Spring, MD  topiramate (TOPAMAX) 50 MG tablet Take 50 mg by mouth daily. 01/24/17  Yes [provider]  warfarin (COUMADIN) 2 MG tablet Take 1 tablet (2 mg total) by mouth daily. Patient taking differently: Take 2 mg by mouth daily. 2 mg on Monday, Wednesday, and Friday. 1 mg on other days 08/07/19 09/06/19 Yes Darliss Cheney, MD  ferrous sulfate 325 (65 FE) MG tablet Take 1 tablet (325 mg total) by mouth 2 (two) times daily with a meal. Patient not taking: Reported on 08/17/2019 06/03/19 08/02/19  Damita Lack, MD     Family History Family History  Problem Relation Age of Onset  . Depression Mother   . Alcohol abuse Mother     Social History Social History   Tobacco Use  . Smoking status: Former Smoker    Types: Cigarettes  . Smokeless tobacco: Never Used  . Tobacco comment: QUIT ABOUT 5 YEARS AGO  Substance Use Topics  . Alcohol use: Yes    Comment: occa  . Drug use: No     Allergies   Aspirin   Review of Systems Review of Systems  Constitutional: Negative for fever.  HENT:  Negative for congestion and sore throat.   Eyes: Negative.   Respiratory: Negative for chest tightness and shortness of breath.   Cardiovascular: Negative for chest pain.  Gastrointestinal: Negative for abdominal pain and nausea.  Genitourinary: Negative.   Musculoskeletal: Negative for arthralgias, joint swelling and neck pain.  Skin: Positive for wound. Negative for rash.  Neurological: Negative for dizziness, weakness, light-headedness, numbness and headaches.  Psychiatric/Behavioral: Negative.      Physical Exam Updated Vital Signs BP (!) 164/96 (BP Location: Left Arm)   Pulse (!) 115   Temp (!) 97.5 F (36.4 C) (Oral)   Resp 16   Ht 5\' 5"  (1.651 m)   Wt (!) 278 kg   LMP 07/27/2019   SpO2 99%   BMI 101.99 kg/m   Physical Exam Vitals signs and nursing note reviewed.  Constitutional:      Appearance: She is well-developed. She is obese.  HENT:     Head: Normocephalic and atraumatic.  Neck:     Musculoskeletal: Normal range of motion.  Cardiovascular:     Rate and Rhythm: Normal rate and regular rhythm.     Heart sounds: Normal heart sounds.  Pulmonary:     Effort: Pulmonary effort is normal.     Breath sounds: No wheezing.  Chest:     Comments: HD catheter noted right upper chest.  No surrounding erythema.   Skin:    General: Skin is warm and dry.     Findings: Lesion present.     Comments: Healing wounds on right medial thigh and calf. No significant change from photo noted on last  visit here 9/27.  Neurological:     Mental Status: She is alert.      ED Treatments / Results  Labs (all labs ordered are listed, but only abnormal results are displayed) Labs Reviewed  I-STAT CHEM 8, ED - Abnormal; Notable for the following components:      Result Value   BUN 3 (*)    Glucose, Bld 111 (*)    Hemoglobin 10.5 (*)    HCT 31.0 (*)    All other components within normal limits    EKG None  Radiology No results found.  Procedures Procedures (including critical care time)  Medications Ordered in ED Medications  lidocaine (PF) (XYLOCAINE) 1 % injection 5 mL (5 mLs Other Given 08/17/19 1335)     Initial Impression / Assessment and Plan / ED Course  I have reviewed the triage vital signs and the nursing notes.  Pertinent labs & imaging results that were available during my care of the patient were reviewed by me and considered in my medical decision making (see chart for details).        Pt discussed with Dr. Posey Pronto who saw pt at bedside and removed her catheter.  Home instructions given.  F/u with pcp as needed.  She and husband expressed concern about dressing supplies.  She was given xeroform and dressings to assist with continued home care.   Final Clinical Impressions(s) / ED Diagnoses   Final diagnoses:  Encounter for removal of vascular catheter    ED Discharge Orders    None       Landis Martins 08/17/19 1349    Milton Ferguson, MD 08/17/19 1512

## 2019-08-17 NOTE — Procedures (Addendum)
Patient here with complaints of discomfort from [facility] placed for AKI during recent hospitalization. Unfortunately discharged from hospital without plans for catheter care or removal.  On exam; RIJ TDC present without tunnel exit site or tunnel infection. Cuff 2 cm deep.   Procedure note (Removal of tunneled HD catheter) Right chest, tunnel exit site and catheter hubs cleaned and draped in sterile fashion. Catheter cuff and tunnel infiltrated with lidocaine. Catheter anchor suture removed. Catheter pulled with manual traction and pressure held for 4 minutes over venotomy site and tunnel exit.  Tunnel exit site dressing placed and patient/husband advised on wound care.  The patient tolerated the procedure without problems.  Elmarie Shiley MD [facility]. Office # [phone] Pager # [phone] 12:48 PM

## 2019-08-17 NOTE — Discharge Instructions (Addendum)
Keep the dressing on your catheter site for the next 24 hours and avoid showering until then.

## 2019-08-25 ENCOUNTER — Encounter (HOSPITAL_BASED_OUTPATIENT_CLINIC_OR_DEPARTMENT_OTHER): Payer: Self-pay

## 2019-09-04 ENCOUNTER — Encounter (HOSPITAL_BASED_OUTPATIENT_CLINIC_OR_DEPARTMENT_OTHER): Payer: Self-pay | Admitting: Internal Medicine

## 2020-02-03 ENCOUNTER — Emergency Department (HOSPITAL_COMMUNITY): Payer: BC Managed Care – PPO

## 2020-02-03 ENCOUNTER — Inpatient Hospital Stay (HOSPITAL_COMMUNITY)
Admission: EM | Admit: 2020-02-03 | Discharge: 2020-02-12 | DRG: 853 | Disposition: E | Payer: BC Managed Care – PPO | Attending: Emergency Medicine | Admitting: Emergency Medicine

## 2020-02-03 ENCOUNTER — Encounter (HOSPITAL_COMMUNITY): Payer: Self-pay | Admitting: Emergency Medicine

## 2020-02-03 ENCOUNTER — Other Ambulatory Visit: Payer: Self-pay

## 2020-02-03 DIAGNOSIS — D509 Iron deficiency anemia, unspecified: Secondary | ICD-10-CM | POA: Diagnosis present

## 2020-02-03 DIAGNOSIS — D638 Anemia in other chronic diseases classified elsewhere: Secondary | ICD-10-CM | POA: Diagnosis present

## 2020-02-03 DIAGNOSIS — I82409 Acute embolism and thrombosis of unspecified deep veins of unspecified lower extremity: Secondary | ICD-10-CM | POA: Diagnosis present

## 2020-02-03 DIAGNOSIS — I2699 Other pulmonary embolism without acute cor pulmonale: Secondary | ICD-10-CM | POA: Diagnosis not present

## 2020-02-03 DIAGNOSIS — G8929 Other chronic pain: Secondary | ICD-10-CM | POA: Diagnosis present

## 2020-02-03 DIAGNOSIS — Z79899 Other long term (current) drug therapy: Secondary | ICD-10-CM

## 2020-02-03 DIAGNOSIS — L03311 Cellulitis of abdominal wall: Secondary | ICD-10-CM | POA: Diagnosis present

## 2020-02-03 DIAGNOSIS — N170 Acute kidney failure with tubular necrosis: Secondary | ICD-10-CM | POA: Diagnosis not present

## 2020-02-03 DIAGNOSIS — Z20822 Contact with and (suspected) exposure to covid-19: Secondary | ICD-10-CM | POA: Diagnosis present

## 2020-02-03 DIAGNOSIS — Z95828 Presence of other vascular implants and grafts: Secondary | ICD-10-CM

## 2020-02-03 DIAGNOSIS — R0600 Dyspnea, unspecified: Secondary | ICD-10-CM

## 2020-02-03 DIAGNOSIS — M793 Panniculitis, unspecified: Secondary | ICD-10-CM | POA: Diagnosis present

## 2020-02-03 DIAGNOSIS — I313 Pericardial effusion (noninflammatory): Secondary | ICD-10-CM | POA: Diagnosis not present

## 2020-02-03 DIAGNOSIS — Z0189 Encounter for other specified special examinations: Secondary | ICD-10-CM

## 2020-02-03 DIAGNOSIS — Z6841 Body Mass Index (BMI) 40.0 and over, adult: Secondary | ICD-10-CM | POA: Diagnosis not present

## 2020-02-03 DIAGNOSIS — J9601 Acute respiratory failure with hypoxia: Secondary | ICD-10-CM | POA: Diagnosis not present

## 2020-02-03 DIAGNOSIS — E869 Volume depletion, unspecified: Secondary | ICD-10-CM | POA: Diagnosis present

## 2020-02-03 DIAGNOSIS — Z515 Encounter for palliative care: Secondary | ICD-10-CM | POA: Diagnosis present

## 2020-02-03 DIAGNOSIS — Z7901 Long term (current) use of anticoagulants: Secondary | ICD-10-CM

## 2020-02-03 DIAGNOSIS — D62 Acute posthemorrhagic anemia: Secondary | ICD-10-CM | POA: Diagnosis not present

## 2020-02-03 DIAGNOSIS — L03115 Cellulitis of right lower limb: Secondary | ICD-10-CM | POA: Diagnosis present

## 2020-02-03 DIAGNOSIS — I10 Essential (primary) hypertension: Secondary | ICD-10-CM | POA: Diagnosis not present

## 2020-02-03 DIAGNOSIS — A419 Sepsis, unspecified organism: Secondary | ICD-10-CM | POA: Diagnosis not present

## 2020-02-03 DIAGNOSIS — F419 Anxiety disorder, unspecified: Secondary | ICD-10-CM | POA: Diagnosis present

## 2020-02-03 DIAGNOSIS — L89156 Pressure-induced deep tissue damage of sacral region: Secondary | ICD-10-CM | POA: Diagnosis present

## 2020-02-03 DIAGNOSIS — E43 Unspecified severe protein-calorie malnutrition: Secondary | ICD-10-CM | POA: Diagnosis not present

## 2020-02-03 DIAGNOSIS — Z86711 Personal history of pulmonary embolism: Secondary | ICD-10-CM

## 2020-02-03 DIAGNOSIS — J9811 Atelectasis: Secondary | ICD-10-CM | POA: Diagnosis present

## 2020-02-03 DIAGNOSIS — Z9289 Personal history of other medical treatment: Secondary | ICD-10-CM

## 2020-02-03 DIAGNOSIS — J9690 Respiratory failure, unspecified, unspecified whether with hypoxia or hypercapnia: Secondary | ICD-10-CM

## 2020-02-03 DIAGNOSIS — R6521 Severe sepsis with septic shock: Secondary | ICD-10-CM | POA: Diagnosis not present

## 2020-02-03 DIAGNOSIS — L89153 Pressure ulcer of sacral region, stage 3: Secondary | ICD-10-CM | POA: Diagnosis present

## 2020-02-03 DIAGNOSIS — G9341 Metabolic encephalopathy: Secondary | ICD-10-CM | POA: Diagnosis not present

## 2020-02-03 DIAGNOSIS — K279 Peptic ulcer, site unspecified, unspecified as acute or chronic, without hemorrhage or perforation: Secondary | ICD-10-CM | POA: Diagnosis present

## 2020-02-03 DIAGNOSIS — Z818 Family history of other mental and behavioral disorders: Secondary | ICD-10-CM

## 2020-02-03 DIAGNOSIS — R160 Hepatomegaly, not elsewhere classified: Secondary | ICD-10-CM | POA: Diagnosis present

## 2020-02-03 DIAGNOSIS — G92 Toxic encephalopathy: Secondary | ICD-10-CM | POA: Diagnosis not present

## 2020-02-03 DIAGNOSIS — N179 Acute kidney failure, unspecified: Secondary | ICD-10-CM | POA: Diagnosis not present

## 2020-02-03 DIAGNOSIS — R579 Shock, unspecified: Secondary | ICD-10-CM | POA: Diagnosis not present

## 2020-02-03 DIAGNOSIS — E872 Acidosis, unspecified: Secondary | ICD-10-CM | POA: Diagnosis not present

## 2020-02-03 DIAGNOSIS — Z79891 Long term (current) use of opiate analgesic: Secondary | ICD-10-CM

## 2020-02-03 DIAGNOSIS — I82401 Acute embolism and thrombosis of unspecified deep veins of right lower extremity: Secondary | ICD-10-CM | POA: Diagnosis present

## 2020-02-03 DIAGNOSIS — E162 Hypoglycemia, unspecified: Secondary | ICD-10-CM | POA: Diagnosis not present

## 2020-02-03 DIAGNOSIS — R739 Hyperglycemia, unspecified: Secondary | ICD-10-CM | POA: Diagnosis not present

## 2020-02-03 DIAGNOSIS — T82528A Displacement of other cardiac and vascular devices and implants, initial encounter: Secondary | ICD-10-CM

## 2020-02-03 DIAGNOSIS — Z452 Encounter for adjustment and management of vascular access device: Secondary | ICD-10-CM

## 2020-02-03 DIAGNOSIS — K760 Fatty (change of) liver, not elsewhere classified: Secondary | ICD-10-CM | POA: Diagnosis present

## 2020-02-03 DIAGNOSIS — L0390 Cellulitis, unspecified: Secondary | ICD-10-CM

## 2020-02-03 DIAGNOSIS — Z01818 Encounter for other preprocedural examination: Secondary | ICD-10-CM

## 2020-02-03 DIAGNOSIS — Z87891 Personal history of nicotine dependence: Secondary | ICD-10-CM

## 2020-02-03 DIAGNOSIS — J45909 Unspecified asthma, uncomplicated: Secondary | ICD-10-CM | POA: Diagnosis present

## 2020-02-03 DIAGNOSIS — M726 Necrotizing fasciitis: Secondary | ICD-10-CM | POA: Diagnosis present

## 2020-02-03 DIAGNOSIS — T884XXA Failed or difficult intubation, initial encounter: Secondary | ICD-10-CM

## 2020-02-03 DIAGNOSIS — Z4659 Encounter for fitting and adjustment of other gastrointestinal appliance and device: Secondary | ICD-10-CM

## 2020-02-03 DIAGNOSIS — Z811 Family history of alcohol abuse and dependence: Secondary | ICD-10-CM

## 2020-02-03 DIAGNOSIS — R6520 Severe sepsis without septic shock: Secondary | ICD-10-CM | POA: Diagnosis not present

## 2020-02-03 DIAGNOSIS — Z86718 Personal history of other venous thrombosis and embolism: Secondary | ICD-10-CM

## 2020-02-03 DIAGNOSIS — Z8711 Personal history of peptic ulcer disease: Secondary | ICD-10-CM

## 2020-02-03 LAB — URINALYSIS, ROUTINE W REFLEX MICROSCOPIC
Bilirubin Urine: NEGATIVE
Glucose, UA: NEGATIVE mg/dL
Hgb urine dipstick: NEGATIVE
Ketones, ur: NEGATIVE mg/dL
Leukocytes,Ua: NEGATIVE
Nitrite: NEGATIVE
Protein, ur: NEGATIVE mg/dL
Specific Gravity, Urine: 1.003 — ABNORMAL LOW (ref 1.005–1.030)
pH: 7 (ref 5.0–8.0)

## 2020-02-03 LAB — CBC WITH DIFFERENTIAL/PLATELET
Abs Immature Granulocytes: 1.53 10*3/uL — ABNORMAL HIGH (ref 0.00–0.07)
Basophils Absolute: 0.1 10*3/uL (ref 0.0–0.1)
Basophils Relative: 0 %
Eosinophils Absolute: 0 10*3/uL (ref 0.0–0.5)
Eosinophils Relative: 0 %
HCT: 20.6 % — ABNORMAL LOW (ref 36.0–46.0)
Hemoglobin: 5.6 g/dL — CL (ref 12.0–15.0)
Immature Granulocytes: 3 %
Lymphocytes Relative: 5 %
Lymphs Abs: 2.5 10*3/uL (ref 0.7–4.0)
MCH: 26.9 pg (ref 26.0–34.0)
MCHC: 27.2 g/dL — ABNORMAL LOW (ref 30.0–36.0)
MCV: 99 fL (ref 80.0–100.0)
Monocytes Absolute: 2.2 10*3/uL — ABNORMAL HIGH (ref 0.1–1.0)
Monocytes Relative: 4 %
Neutro Abs: 43.7 10*3/uL — ABNORMAL HIGH (ref 1.7–7.7)
Neutrophils Relative %: 88 %
Platelets: 397 10*3/uL (ref 150–400)
RBC: 2.08 MIL/uL — ABNORMAL LOW (ref 3.87–5.11)
RDW: 23.8 % — ABNORMAL HIGH (ref 11.5–15.5)
WBC: 50.1 10*3/uL (ref 4.0–10.5)
nRBC: 0.1 % (ref 0.0–0.2)

## 2020-02-03 LAB — CBG MONITORING, ED: Glucose-Capillary: 108 mg/dL — ABNORMAL HIGH (ref 70–99)

## 2020-02-03 LAB — COMPREHENSIVE METABOLIC PANEL
ALT: 13 U/L (ref 0–44)
AST: 25 U/L (ref 15–41)
Albumin: 1.3 g/dL — ABNORMAL LOW (ref 3.5–5.0)
Alkaline Phosphatase: 200 U/L — ABNORMAL HIGH (ref 38–126)
Anion gap: 13 (ref 5–15)
BUN: 7 mg/dL (ref 6–20)
CO2: 19 mmol/L — ABNORMAL LOW (ref 22–32)
Calcium: 8 mg/dL — ABNORMAL LOW (ref 8.9–10.3)
Chloride: 101 mmol/L (ref 98–111)
Creatinine, Ser: 0.85 mg/dL (ref 0.44–1.00)
GFR calc Af Amer: >60 mL/min (ref >60)
GFR calc non Af Amer: >60 mL/min (ref >60)
Glucose, Bld: 109 mg/dL — ABNORMAL HIGH (ref 70–99)
Potassium: 4.4 mmol/L (ref 3.5–5.1)
Sodium: 133 mmol/L — ABNORMAL LOW (ref 135–145)
Total Bilirubin: 0.3 mg/dL (ref 0.3–1.2)
Total Protein: 6.4 g/dL — ABNORMAL LOW (ref 6.5–8.1)

## 2020-02-03 LAB — RESPIRATORY PANEL BY RT PCR (FLU A&B, COVID)
Influenza A by PCR: NEGATIVE
Influenza B by PCR: NEGATIVE
SARS Coronavirus 2 by RT PCR: NEGATIVE

## 2020-02-03 LAB — PROTIME-INR
INR: 1.4 — ABNORMAL HIGH (ref 0.8–1.2)
Prothrombin Time: 17.2 seconds — ABNORMAL HIGH (ref 11.4–15.2)

## 2020-02-03 LAB — LACTIC ACID, PLASMA
Lactic Acid, Venous: 6.8 mmol/L (ref 0.5–1.9)
Lactic Acid, Venous: 8 mmol/L (ref 0.5–1.9)

## 2020-02-03 LAB — POC URINE PREG, ED: Preg Test, Ur: NEGATIVE

## 2020-02-03 LAB — ABO/RH: ABO/RH(D): O POS

## 2020-02-03 LAB — PREPARE RBC (CROSSMATCH)

## 2020-02-03 LAB — APTT: aPTT: 34 seconds (ref 24–36)

## 2020-02-03 MED ORDER — VANCOMYCIN HCL 2000 MG/400ML IV SOLN
2000.0000 mg | Freq: Two times a day (BID) | INTRAVENOUS | Status: DC
  Administered 2020-02-04 – 2020-02-05 (×3): 2000 mg via INTRAVENOUS
  Filled 2020-02-03 (×9): qty 400

## 2020-02-03 MED ORDER — VANCOMYCIN HCL 500 MG IV SOLR
INTRAVENOUS | Status: AC
  Filled 2020-02-03: qty 500

## 2020-02-03 MED ORDER — ACETAMINOPHEN 500 MG PO TABS
1000.0000 mg | ORAL_TABLET | Freq: Once | ORAL | Status: AC
  Administered 2020-02-03: 20:00:00 1000 mg via ORAL
  Filled 2020-02-03: qty 2

## 2020-02-03 MED ORDER — VANCOMYCIN HCL 10 G IV SOLR
2500.0000 mg | Freq: Once | INTRAVENOUS | Status: AC
  Administered 2020-02-03: 20:00:00 2500 mg via INTRAVENOUS
  Filled 2020-02-03: qty 2000

## 2020-02-03 MED ORDER — ONDANSETRON HCL 4 MG/2ML IJ SOLN
4.0000 mg | Freq: Once | INTRAMUSCULAR | Status: AC
  Administered 2020-02-03: 20:00:00 4 mg via INTRAVENOUS
  Filled 2020-02-03: qty 2

## 2020-02-03 MED ORDER — SODIUM CHLORIDE 0.9% IV SOLUTION: Freq: Once | INTRAVENOUS | Status: AC

## 2020-02-03 MED ORDER — LACTATED RINGERS IV BOLUS
1000.0000 mL | Freq: Once | INTRAVENOUS | Status: AC
  Administered 2020-02-04: 1000 mL via INTRAVENOUS

## 2020-02-03 MED ORDER — LACTATED RINGERS IV BOLUS
1000.0000 mL | Freq: Once | INTRAVENOUS | Status: AC
  Administered 2020-02-03: 23:00:00 1000 mL via INTRAVENOUS

## 2020-02-03 MED ORDER — SODIUM CHLORIDE 0.9 % IV SOLN
2.0000 g | Freq: Once | INTRAVENOUS | Status: AC
  Administered 2020-02-03: 2 g via INTRAVENOUS
  Filled 2020-02-03: qty 20

## 2020-02-03 MED ORDER — LACTATED RINGERS IV BOLUS
1000.0000 mL | Freq: Once | INTRAVENOUS | Status: AC
  Administered 2020-02-03: 19:00:00 1000 mL via INTRAVENOUS

## 2020-02-03 MED ORDER — VANCOMYCIN HCL 1000 MG IV SOLR
INTRAVENOUS | Status: AC
  Filled 2020-02-03: qty 2000

## 2020-02-03 NOTE — ED Notes (Signed)
Date and time results received: 01/13/2020 7:30 PM  (use smartphrase ".now" to insert current time)  Test: lactic acid Critical Value: 6.8  Name of Provider Notified: Dr. Lacinda Axon  Orders Received? Or Actions Taken?: see chart

## 2020-02-03 NOTE — ED Triage Notes (Signed)
Pt c/o generalized weakness, nausea and dizziness x 2 days. Pt also c/o multiple decubitus to hips and legs.

## 2020-02-03 NOTE — Progress Notes (Signed)
Pharmacy Antibiotic Note  Megan Ortiz is a 44 y.o. female admitted on 01/18/2020 with multiple decubitus ulcers to hips and legs.  Pharmacy has been consulted for Vancomyin dosing for cellulitis.  Pt is significantly obese with wt ~ 500lbs.  SCr 0.85.  WBC extremely elevated.  No fever noted.  Plan: Vancomycin 2500mg  IV x 1 now Vancomycin 2000mg  IV q12h Dose calculated using SCr 0.85 and Vd 0.5 Predicted AUC 522.7 (goal 400-550)  Height: 5\' 5"  (165.1 cm) Weight: (!) 500 lb (226.8 kg) IBW/kg (Calculated) : 57  Temp (24hrs), Avg:99.2 F (37.3 C), Min:99.2 F (37.3 C), Max:99.2 F (37.3 C)  Recent Labs  Lab 01/22/2020 1756  WBC 50.1*  CREATININE 0.85    Estimated Creatinine Clearance: 166.5 mL/min (by C-G formula based on SCr of 0.85 mg/dL).    Allergies  Allergen Reactions  . Aspirin     Due to hx of stomach ulcers  . Coumarin     Antimicrobials this admission: Vanc 3/23 >> Rocephin x 1 in ED 3/23  Dose adjustments this admission:   Microbiology results:  Thank you for allowing pharmacy to be a part of this patient's care.  [employer], Pharm.D., BCPS Clinical Pharmacist Clinical phone for 02/09/2020 is [phone].  **Pharmacist phone directory can be found on [email or web address] listed under [place].  02/09/2020 7:06 PM

## 2020-02-03 NOTE — ED Notes (Signed)
Date and time results received: 02/02/2020 2230 (use smartphrase ".now" to insert current time)  Test: lactic Critical Value: 8  Name of Provider Notified: notified Dr. Mariane Masters and Dr. Olevia Bowens  Orders Received? Or Actions Taken?: no/na

## 2020-02-03 NOTE — H&P (Addendum)
NAME:  Megan Ortiz, MRN:  [id number], DOB:  05/29/1976, LOS: 0 ADMISSION DATE:  01/18/2020, CONSULTATION DATE:  02/04/20 REFERRING MD:  [facility], CHIEF COMPLAINT:  weakness  Brief History   Patient with history of calciphylaxis presented to [facility] emergency room with complaints of general malaise.  History of present illness   44 year old morbidly obese female with history of calciphylaxis diagnosed several months ago at [facility], hx DVT/PE, chronic anemia, presents with weakness and general malaise.  On presentation patient was found to have a leukocytosis of 50,000 with a hemoglobin of 5.6, platelet count of 397, lactic acid 8.0.  Patient's creatinine is 0.85 calcium of 8.8 with a phosphorus of 5.3. Relative hypotension  Past Medical History  Nonuremic calciphylaxis without renal failure Microcytic anemia History of PE  and DVT status post IVC placement on Eliquis Deep chronic ulcer secondary to calciphylaxis Morbid obesity   Significant Hospital Events   Admission 02/04/2020  Consults:  Wound care  Procedures:  NA  Significant Diagnostic Tests:    Micro Data:  Blood 3/23 >>  Urine 3/24 >>   Antimicrobials:  Zosyn 3/23  >> Vancomycin 3/23 >>   Interim history/subjective:  NA  Objective   Blood pressure (!) 90/59, pulse (!) 122, temperature 99.9 F (37.7 C), temperature source Oral, resp. rate 20, height 5\' 5"  (1.651 m), weight (!) 226.8 kg, SpO2 98 %.        Intake/Output Summary (Last 24 hours) at 01/21/2020 2257 Last data filed at 01/15/2020 2127 Gross per 24 hour  Intake 1215 ml  Output --  Net 1215 ml   Filed Weights   01/27/2020 1649  Weight: (!) 226.8 kg    Examination: General: Morbidly obese woman, lying comfortably in bariatric bed HENT: Oropharynx clear, edentulous, no secretions, no stridor Lungs: Clear bilaterally, decreased at both bases Cardiovascular: Regular, tachycardic 120, no murmur Abdomen: Obese, nondistended,  hypoactive bowel sounds Extremities: No significant edema Skin: She is decubitus wounds on bilateral lateral thighs, bilateral hips with dry dressings in place, the right thigh wound is packed.  No surrounding erythema, evidence for cellulitis.  No significant odor or drainage Neuro: Awake, alert, interacting appropriately, follows commands  Resolved Hospital Problem list   NA  Assessment & Plan:  1.  Calciphylaxis with the wounds as described above: Most likely source of fever. Will cover with broad-spectrum antibiotics vancomycin and Zosyn.   Obtain culture data, blood cultures done at [place] care consult Patient will likely need palliative care consult for goals and also pain control Bariatric bed   2.  Chronic pain secondary to calciphylaxis:  Pain control ordered > tylenol, tramadol, oxycodone Elevated phosphorus, may need calcium phosphate binder  4.  Microcytic anemia: PRBC ordered Follow CBC posttransfusion for response No clear evidence for acute blood loss.  Patient denies bleeding from her wounds  5.  Sepsis with profound leukocytosis: Source seems most likely secondary to extremity wounds.  She had weakness, fatigue, relative hypotension with elevated lactate, consistent w sepsis. Her wounds actually look good Empiric broad-spectrum antibiotics pending culture data Follow lactic acid for clearance Wound care  6.  Severe weakness and fatigue.  Improving.  Suspect related to sepsis and severe anemia. Supportive care as outlined above  7.  Anion gap acidosis secondary to lactic acidosis: Most likely some degree of volume depletion, early sepsis complicated by severe microcytic anemia Restore perfusion by treating sepsis, volume cessation, blood resuscitation Follow lactate for clearance  8. Hx DVT  and PE, with IVC filter Restart her home Eliquis  8.  Atelectasis of left lower lobe: We will treat conservatively tonight with chest physiotherapy and  nebs Follow chest x-ray  Best practice:  Diet: heart heathy  Pain/Anxiety/Delirium protocol (if indicated): As needed VAP protocol (if indicated): N/A DVT prophylaxis: Eliquis GI prophylaxis: N/A Glucose control: We will monitor Mobility: Bedrest Code Status: Full Family Communication: spoke with the patient 3/24 Disposition: To ICU  Labs   CBC: Recent Labs  Lab 01/25/2020 1756  WBC 50.1*  NEUTROABS 43.7*  HGB 5.6*  HCT 20.6*  MCV 99.0  PLT 496    Basic Metabolic Panel: Recent Labs  Lab 01/16/2020 1756  NA 133*  K 4.4  CL 101  CO2 19*  GLUCOSE 109*  BUN 7  CREATININE 0.85  CALCIUM 8.0*   GFR: Estimated Creatinine Clearance: 166.5 mL/min (by C-G formula based on SCr of 0.85 mg/dL). Recent Labs  Lab 01/14/2020 1756 01/27/2020 1839 01/12/2020 2139  WBC 50.1*  --   --   LATICACIDVEN  --  6.8* 8.0*    Liver Function Tests: Recent Labs  Lab 02/05/2020 1756  AST 25  ALT 13  ALKPHOS 200*  BILITOT 0.3  PROT 6.4*  ALBUMIN 1.3*   No results for input(s): LIPASE, AMYLASE in the last 168 hours. No results for input(s): AMMONIA in the last 168 hours.  ABG    Component Value Date/Time   PHART 7.371 05/30/2019 1655   PCO2ART 33.4 05/30/2019 1655   PO2ART 79.4 (L) 05/30/2019 1655   HCO3 18.9 (L) 05/30/2019 1655   TCO2 23 08/17/2019 1148   ACIDBASEDEF 5.4 (H) 05/30/2019 1655   O2SAT 96.2 05/30/2019 1655     Coagulation Profile: Recent Labs  Lab 02/06/2020 1839  INR 1.4*    Cardiac Enzymes: No results for input(s): CKTOTAL, CKMB, CKMBINDEX, TROPONINI in the last 168 hours.  HbA1C: Hgb A1c MFr Bld  Date/Time Value Ref Range Status  02/26/2017 04:52 AM 5.4 4.8 - 5.6 % Final    Comment:    (NOTE)         Pre-diabetes: 5.7 - 6.4         Diabetes: >6.4         Glycemic control for adults with diabetes: <7.0     CBG: Recent Labs  Lab 01/18/2020 1828  GLUCAP 108*    Review of Systems:   Pain at her calciphylaxis sites, wounds  Past Medical History   She,  has a past medical history of Anemia, Anemia, Anxiety, Asthma, DVT (deep venous thrombosis) ([place]), Gastric ulcer, Headache, Hiatal hernia, Hypertension, Necrotizing fasciitis ([place]), PE (pulmonary embolism), and Tachycardia.   Surgical History    Past Surgical History:  Procedure Laterality Date  . CESAREAN SECTION    . CHOLECYSTECTOMY    . ESOPHAGOGASTRODUODENOSCOPY (EGD) WITH PROPOFOL N/A 05/25/2019   Procedure: ESOPHAGOGASTRODUODENOSCOPY (EGD) WITH PROPOFOL;  Surgeon: [name], MD;  Location: [facility] ENDOSCOPY;  Service: Endoscopy;  Laterality: N/A;  . INCISION AND DRAINAGE ABSCESS Left 02/16/2017   Procedure: DEBRIDEMENT LEFT BUTTOCK ABSCESS;  Surgeon: Fanny Skates, MD;  Location: [place];  Service: General;  Laterality: Left;  . INSERTION OF DIALYSIS CATHETER Right 07/25/2019   Procedure: INSERTION OF TUNNEL DIALYSIS CATHETER;  Surgeon: Waynetta Sandy, MD;  Location: [place];  Service: Vascular;  Laterality: Right;  . IRRIGATION AND DEBRIDEMENT BUTTOCKS Left 02/19/2017   Procedure: DEBRIDEMENT OF GLUTEAL WOUND;  Surgeon: Rolm Bookbinder, MD;  Location: [place];  Service: General;  Laterality: Left;     Social History   reports that she has quit smoking. Her smoking use included cigarettes. She has never used smokeless tobacco. She reports current alcohol use. She reports that she does not use drugs.   Family History   Her family history includes Alcohol abuse in her mother; Depression in her mother.   Allergies Allergies  Allergen Reactions  . Aspirin     Due to hx of stomach ulcers  . Coumarin      Home Medications  Prior to Admission medications   Medication Sig Start Date End Date Taking? Authorizing Provider  acetaminophen (TYLENOL) 500 MG tablet Take 500 mg by mouth every 6 (six) hours as needed for moderate pain.   Yes [provider]  albuterol (VENTOLIN HFA) 108 (90 BASE) MCG/ACT inhaler Inhale 2 puffs into the lungs every 6 (six) hours as needed  for wheezing.   Yes [provider]  ELIQUIS 5 MG TABS tablet Take 5 mg by mouth 2 (two) times daily. 12/09/19  Yes [provider]  gabapentin (NEURONTIN) 800 MG tablet Take 800 mg by mouth 3 (three) times daily. 01/15/20  Yes [provider]  montelukast (SINGULAIR) 10 MG tablet Take 10 mg by mouth daily.   Yes [provider]  nortriptyline (PAMELOR) 25 MG capsule Take 25 mg by mouth at bedtime.   Yes [provider]  ondansetron (ZOFRAN) 4 MG tablet Take 1 tablet (4 mg total) by mouth every 8 (eight) hours as needed for nausea or vomiting. 03/09/17  Yes Rai, Ripudeep K, MD  oxyCODONE (OXY IR/ROXICODONE) 5 MG immediate release tablet Take 5 mg by mouth every 6 (six) hours as needed. 01/22/20  Yes [provider]  sertraline (ZOLOFT) 100 MG tablet Take 1 tablet (100 mg total) by mouth 2 (two) times daily. 01/19/15  Yes Cloria Spring, MD  topiramate (TOPAMAX) 50 MG tablet Take 50 mg by mouth daily. 01/24/17  Yes [provider]  traMADol (ULTRAM) 50 MG tablet Take 50 mg by mouth 3 (three) times daily as needed. 01/04/20  Yes [provider]  pantoprazole (PROTONIX) 40 MG tablet Take 1 tablet (40 mg total) by mouth 2 (two) times daily before a meal. 06/03/19 08/17/19  Damita Lack, MD     Critical care time: 45 minutes     Baltazar Apo, MD, PhD 02/04/2020, 6:04 PM [place] [facility] [phone] or if no answer [phone]

## 2020-02-03 NOTE — ED Provider Notes (Addendum)
[facility] EMERGENCY DEPARTMENT Provider Note   CSN: [id number] Arrival date & time: 01/28/2020  1641     History Chief Complaint  Patient presents with  . Weakness    Megan Ortiz is a 44 y.o. female.  Level 5 caveat for acuity of condition.  Chief complaint weakness and open wounds on abdomen, left and right hip.  Past medical history includes morbid obesity and calciphylaxis.  She has multiple areas of open wounds on her body. She is seen at [facility] for her condition.  Now she is weak and dizzy.  Additional health problems include anemia, hypertension, necrotizing fasciitis, pulmonary embolism, peptic ulcer disease, DVT, anxiety, many others.        Past Medical History:  Diagnosis Date  . Anemia   . Anemia   . Anxiety   . Asthma   . DVT (deep venous thrombosis) ([place])   . Gastric ulcer   . Headache   . Hiatal hernia   . Hypertension   . Necrotizing fasciitis ([place])   . PE (pulmonary embolism)   . Tachycardia     Patient Active Problem List   Diagnosis Date Noted  . Cellulitis of right lower extremity 08/06/2019  . Traumatic ecchymosis of right lower leg 08/06/2019  . Hypokalemia 08/01/2019  . Cellulitis 07/19/2019  . Supratherapeutic INR 07/19/2019  . GERD (gastroesophageal reflux disease) 07/19/2019  . Encephalopathy   . ARF (acute renal failure) ([place])   . DVT (deep venous thrombosis) ([place])   . Anemia   . Morbidly obese ([place])   . Abnormal CT scan, stomach   . Leg DVT (deep venous thromboembolism), acute, right ([place]) 05/24/2019  . Right leg pain 05/24/2019  . Suspected Pulmonary embolus 05/24/2019  . Community acquired pneumonia 05/24/2019  . Positive D dimer 05/24/2019  . Left lower lobe pneumonia 05/24/2019  . Hypotension 05/24/2019  . Benign essential HTN   . Uncomplicated asthma   . Morbid Obesity    . Adjustment disorder with mixed anxiety and depressed mood   . Hyperglycemia   . Leukocytosis   . Acute blood loss anemia   . Acute  hypoxemic respiratory failure ([place])   . AKI (acute kidney injury) ([place])   . Septic shock ([place]) 02/16/2017  . Necrotizing soft tissue infection 02/16/2017  . Generalized anxiety disorder 12/22/2014    Past Surgical History:  Procedure Laterality Date  . CESAREAN SECTION    . CHOLECYSTECTOMY    . ESOPHAGOGASTRODUODENOSCOPY (EGD) WITH PROPOFOL N/A 05/25/2019   Procedure: ESOPHAGOGASTRODUODENOSCOPY (EGD) WITH PROPOFOL;  Surgeon: [name], MD;  Location: [facility] ENDOSCOPY;  Service: Endoscopy;  Laterality: N/A;  . INCISION AND DRAINAGE ABSCESS Left 02/16/2017   Procedure: DEBRIDEMENT LEFT BUTTOCK ABSCESS;  Surgeon: Fanny Skates, MD;  Location: [place];  Service: General;  Laterality: Left;  . INSERTION OF DIALYSIS CATHETER Right 07/25/2019   Procedure: INSERTION OF TUNNEL DIALYSIS CATHETER;  Surgeon: Waynetta Sandy, MD;  Location: [place];  Service: Vascular;  Laterality: Right;  . IRRIGATION AND DEBRIDEMENT BUTTOCKS Left 02/19/2017   Procedure: DEBRIDEMENT OF GLUTEAL WOUND;  Surgeon: Rolm Bookbinder, MD;  Location: [place];  Service: General;  Laterality: Left;     OB History    Gravida  1   Para  1   Term  1   Preterm      AB      Living  1     SAB      TAB      Ectopic  Multiple      Live Births              Family History  Problem Relation Age of Onset  . Depression Mother   . Alcohol abuse Mother     Social History   Tobacco Use  . Smoking status: Former Smoker    Types: Cigarettes  . Smokeless tobacco: Never Used  . Tobacco comment: QUIT ABOUT 5 YEARS AGO  Substance Use Topics  . Alcohol use: Yes    Comment: occa  . Drug use: No    Home Medications Prior to Admission medications   Medication Sig Start Date End Date Taking? Authorizing Provider  acetaminophen (TYLENOL) 500 MG tablet Take 500 mg by mouth every 6 (six) hours as needed for moderate pain.    [provider]  albuterol (VENTOLIN HFA) 108 (90 BASE) MCG/ACT inhaler  Inhale 2 puffs into the lungs every 6 (six) hours as needed for wheezing.    [provider]  diltiazem (TIAZAC) 180 MG 24 hr capsule Take 180 mg by mouth daily.    [provider]  esomeprazole (NEXIUM) 20 MG capsule Take 20 mg by mouth daily at 12 noon.    [provider]  ferrous sulfate 325 (65 FE) MG tablet Take 1 tablet (325 mg total) by mouth 2 (two) times daily with a meal. Patient not taking: Reported on 08/17/2019 06/03/19 08/02/19  Damita Lack, MD  montelukast (SINGULAIR) 10 MG tablet Take 10 mg by mouth daily.    [provider]  nortriptyline (PAMELOR) 25 MG capsule Take 25 mg by mouth at bedtime.    [provider]  ondansetron (ZOFRAN) 4 MG tablet Take 1 tablet (4 mg total) by mouth every 8 (eight) hours as needed for nausea or vomiting. 03/09/17   Rai, Ripudeep K, MD  pantoprazole (PROTONIX) 40 MG tablet Take 1 tablet (40 mg total) by mouth 2 (two) times daily before a meal. 06/03/19 08/17/19  Amin, Jeanella Flattery, MD  sertraline (ZOLOFT) 100 MG tablet Take 1 tablet (100 mg total) by mouth 2 (two) times daily. 01/19/15   Cloria Spring, MD  topiramate (TOPAMAX) 50 MG tablet Take 50 mg by mouth daily. 01/24/17   [provider]  warfarin (COUMADIN) 2 MG tablet Take 1 tablet (2 mg total) by mouth daily. Patient taking differently: Take 2 mg by mouth daily. 2 mg on Monday, Wednesday, and Friday. 1 mg on other days 08/07/19 09/06/19  Darliss Cheney, MD    Allergies    Aspirin and Coumarin  Review of Systems   Review of Systems  Unable to perform ROS: Acuity of condition    Physical Exam Updated Vital Signs BP (!) 85/38   Pulse (!) 140   Temp 99.2 F (37.3 C) (Oral)   Resp (!) 24   Ht 5\' 5"  (1.651 m)   Wt (!) 226.8 kg   SpO2 100%   BMI 83.20 kg/m   Physical Exam Vitals and nursing note reviewed.  Constitutional:      Appearance: She is well-developed.     Comments: Alert, pale, morbidly obese  HENT:     Head:  Normocephalic and atraumatic.  Eyes:     Conjunctiva/sclera: Conjunctivae normal.  Cardiovascular:     Rate and Rhythm: Normal rate and regular rhythm.  Pulmonary:     Effort: Pulmonary effort is normal.     Breath sounds: Normal breath sounds.  Abdominal:     General: Bowel sounds are normal.  Palpations: Abdomen is soft.  Musculoskeletal:        General: Normal range of motion.     Cervical back: Neck supple.  Skin:    Comments: Open sores on abdomen, hips  Neurological:     General: No focal deficit present.     Mental Status: She is oriented to person, place, and time.  Psychiatric:        Behavior: Behavior normal.     ED Results / Procedures / Treatments   Labs (all labs ordered are listed, but only abnormal results are displayed) Labs Reviewed  COMPREHENSIVE METABOLIC PANEL - Abnormal; Notable for the following components:      Result Value   Sodium 133 (*)    CO2 19 (*)    Glucose, Bld 109 (*)    Calcium 8.0 (*)    Total Protein 6.4 (*)    Albumin 1.3 (*)    Alkaline Phosphatase 200 (*)    All other components within normal limits  CBC WITH DIFFERENTIAL/PLATELET - Abnormal; Notable for the following components:   WBC 50.1 (*)    RBC 2.08 (*)    Hemoglobin 5.6 (*)    HCT 20.6 (*)    MCHC 27.2 (*)    RDW 23.8 (*)    Neutro Abs 43.7 (*)    Monocytes Absolute 2.2 (*)    Abs Immature Granulocytes 1.53 (*)    All other components within normal limits  PROTIME-INR - Abnormal; Notable for the following components:   Prothrombin Time 17.2 (*)    INR 1.4 (*)    All other components within normal limits  CBG MONITORING, ED - Abnormal; Notable for the following components:   Glucose-Capillary 108 (*)    All other components within normal limits  URINE CULTURE  CULTURE, BLOOD (ROUTINE X 2)  CULTURE, BLOOD (ROUTINE X 2)  APTT  URINALYSIS, ROUTINE W REFLEX MICROSCOPIC  LACTIC ACID, PLASMA  LACTIC ACID, PLASMA  POC URINE PREG, ED  TYPE AND SCREEN  PREPARE  RBC (CROSSMATCH)  ABO/RH    EKG None  Radiology DG Chest Port 1 View  Result Date: 01/20/2020 CLINICAL DATA:  Generalized weakness with nausea and dizziness. Concern for sepsis EXAM: PORTABLE CHEST 1 VIEW COMPARISON:  July 25, 2019 FINDINGS: There is atelectasis in the left base. Lungs elsewhere clear. Heart is mildly enlarged with pulmonary vascularity normal. There is a sizable hiatal hernia. No adenopathy. No bone lesions. IMPRESSION: Left base atelectasis. Lungs elsewhere clear. Stable cardiac silhouette. Sizable hiatal hernia. Electronically Signed   By: Lowella Grip III M.D.   On: 01/22/2020 18:56    Procedures Procedures (including critical care time)  Medications Ordered in ED Medications  0.9 %  sodium chloride infusion (Manually program via Guardrails IV Fluids) (has no administration in time range)  vancomycin (VANCOCIN) 2,500 mg in sodium chloride 0.9 % 500 mL IVPB (has no administration in time range)  vancomycin (VANCOREADY) IVPB 2000 mg/400 mL (has no administration in time range)  cefTRIAXone (ROCEPHIN) 2 g in sodium chloride 0.9 % 100 mL IVPB (2 g Intravenous New Bag/Given 02/01/2020 1839)  lactated ringers bolus 1,000 mL (1,000 mLs Intravenous New Bag/Given 02/06/2020 1903)    ED Course  I have reviewed the triage vital signs and the nursing notes.  Pertinent labs & imaging results that were available during my care of the patient were reviewed by me and considered in my medical decision making (see chart for details).    MDM Rules/Calculators/A&P  Code sepsis initiated.  Intravenous antibiotics and IV fluids initiated.  Hemoglobin 5.6.  White count 50.1.  Will consult critical care at [facility] for transfer.  Patient rechecked multiple times in ED course.  Discussion with critical care physician.  Please note: Appropriate fluid bolus was challenging secondary to additional pathological requirement of packed red blood cells.   Additionally, secondary to her morbid obesity, intravenous access was challenging.  I thought the risk of a central line outweighed outweighed the benefits secondary to her body habitus.   CRITICAL CARE Performed by: Nat Christen Total critical care time: 65 minutes Critical care time was exclusive of separately billable procedures and treating other patients. Critical care was necessary to treat or prevent imminent or life-threatening deterioration. Critical care was time spent personally by me on the following activities: development of treatment plan with patient and/or surrogate as well as nursing, discussions with consultants, evaluation of patient's response to treatment, examination of patient, obtaining history from patient or surrogate, ordering and performing treatments and interventions, ordering and review of laboratory studies, ordering and review of radiographic studies, pulse oximetry and re-evaluation of patient's condition. Final Clinical Impression(s) / ED Diagnoses Final diagnoses:  Sepsis, due to unspecified organism, unspecified whether acute organ dysfunction present [facility])  Calciphylaxis  Morbid obesity ([place])  Cellulitis, unspecified cellulitis site    Rx / DC Orders ED Discharge Orders    None       Nat Christen, MD 01/27/2020 Lurena Nida    Nat Christen, MD 02/04/20 2246

## 2020-02-03 NOTE — ED Notes (Signed)
Called AC for vancomycin

## 2020-02-04 DIAGNOSIS — A419 Sepsis, unspecified organism: Secondary | ICD-10-CM

## 2020-02-04 LAB — CBC
HCT: 22.9 % — ABNORMAL LOW (ref 36.0–46.0)
HCT: 27.1 % — ABNORMAL LOW (ref 36.0–46.0)
Hemoglobin: 6.5 g/dL — CL (ref 12.0–15.0)
Hemoglobin: 8 g/dL — ABNORMAL LOW (ref 12.0–15.0)
MCH: 26.7 pg (ref 26.0–34.0)
MCH: 27.9 pg (ref 26.0–34.0)
MCHC: 28.4 g/dL — ABNORMAL LOW (ref 30.0–36.0)
MCHC: 29.5 g/dL — ABNORMAL LOW (ref 30.0–36.0)
MCV: 94.2 fL (ref 80.0–100.0)
MCV: 94.4 fL (ref 80.0–100.0)
Platelets: 337 10*3/uL (ref 150–400)
Platelets: 316 10*3/uL (ref 150–400)
RBC: 2.43 MIL/uL — ABNORMAL LOW (ref 3.87–5.11)
RBC: 2.87 MIL/uL — ABNORMAL LOW (ref 3.87–5.11)
RDW: 22 % — ABNORMAL HIGH (ref 11.5–15.5)
RDW: 20.1 % — ABNORMAL HIGH (ref 11.5–15.5)
WBC: 39.3 10*3/uL — ABNORMAL HIGH (ref 4.0–10.5)
WBC: 32.6 10*3/uL — ABNORMAL HIGH (ref 4.0–10.5)
nRBC: 0.1 % (ref 0.0–0.2)
nRBC: 0.1 % (ref 0.0–0.2)

## 2020-02-04 LAB — COMPREHENSIVE METABOLIC PANEL
ALT: 13 U/L (ref 0–44)
AST: 21 U/L (ref 15–41)
Albumin: <1 g/dL — ABNORMAL LOW (ref 3.5–5.0)
Alkaline Phosphatase: 191 U/L — ABNORMAL HIGH (ref 38–126)
Anion gap: 15 (ref 5–15)
BUN: 9 mg/dL (ref 6–20)
CO2: 16 mmol/L — ABNORMAL LOW (ref 22–32)
Calcium: 7.9 mg/dL — ABNORMAL LOW (ref 8.9–10.3)
Chloride: 99 mmol/L (ref 98–111)
Creatinine, Ser: 0.95 mg/dL (ref 0.44–1.00)
GFR calc Af Amer: >60 mL/min (ref >60)
GFR calc non Af Amer: >60 mL/min (ref >60)
Glucose, Bld: 108 mg/dL — ABNORMAL HIGH (ref 70–99)
Potassium: 4.7 mmol/L (ref 3.5–5.1)
Sodium: 130 mmol/L — ABNORMAL LOW (ref 135–145)
Total Bilirubin: 0.8 mg/dL (ref 0.3–1.2)
Total Protein: 5.7 g/dL — ABNORMAL LOW (ref 6.5–8.1)

## 2020-02-04 LAB — MRSA PCR SCREENING: MRSA by PCR: POSITIVE — AB

## 2020-02-04 LAB — LACTIC ACID, PLASMA: Lactic Acid, Venous: 6 mmol/L (ref 0.5–1.9)

## 2020-02-04 LAB — PHOSPHORUS: Phosphorus: 4 mg/dL (ref 2.5–4.6)

## 2020-02-04 LAB — GLUCOSE, CAPILLARY
Glucose-Capillary: 86 mg/dL (ref 70–99)
Glucose-Capillary: 100 mg/dL — ABNORMAL HIGH (ref 70–99)

## 2020-02-04 LAB — PROTIME-INR
INR: 1.4 — ABNORMAL HIGH (ref 0.8–1.2)
Prothrombin Time: 16.6 seconds — ABNORMAL HIGH (ref 11.4–15.2)

## 2020-02-04 LAB — PREPARE RBC (CROSSMATCH)

## 2020-02-04 LAB — TYPE AND SCREEN
ABO/RH(D): O POS
Antibody Screen: NEGATIVE

## 2020-02-04 LAB — MAGNESIUM: Magnesium: 1.6 mg/dL — ABNORMAL LOW (ref 1.7–2.4)

## 2020-02-04 LAB — HEMOGLOBIN A1C
Hgb A1c MFr Bld: 5.1 % (ref 4.8–5.6)
Mean Plasma Glucose: 99.67 mg/dL

## 2020-02-04 LAB — PROCALCITONIN: Procalcitonin: 14.97 ng/mL

## 2020-02-04 MED ORDER — SILVER SULFADIAZINE 1 % EX CREA
TOPICAL_CREAM | Freq: Once | CUTANEOUS | Status: AC
  Filled 2020-02-04: qty 50

## 2020-02-04 MED ORDER — GABAPENTIN 300 MG PO CAPS
800.0000 mg | ORAL_CAPSULE | Freq: Three times a day (TID) | ORAL | Status: DC
  Administered 2020-02-04 – 2020-02-09 (×11): 800 mg via ORAL
  Filled 2020-02-04 (×11): qty 2

## 2020-02-04 MED ORDER — MONTELUKAST SODIUM 10 MG PO TABS
10.0000 mg | ORAL_TABLET | Freq: Every day | ORAL | Status: DC
  Administered 2020-02-05 – 2020-02-09 (×3): 10 mg via ORAL
  Filled 2020-02-04 (×3): qty 1

## 2020-02-04 MED ORDER — ALBUTEROL SULFATE (2.5 MG/3ML) 0.083% IN NEBU
3.0000 mL | INHALATION_SOLUTION | Freq: Four times a day (QID) | RESPIRATORY_TRACT | Status: DC | PRN
  Administered 2020-02-06 – 2020-02-07 (×2): 3 mL via RESPIRATORY_TRACT
  Filled 2020-02-04 (×2): qty 3

## 2020-02-04 MED ORDER — CHLORHEXIDINE GLUCONATE CLOTH 2 % EX PADS
6.0000 | MEDICATED_PAD | Freq: Every day | CUTANEOUS | Status: DC
  Administered 2020-02-04 – 2020-02-11 (×8): 6 via TOPICAL

## 2020-02-04 MED ORDER — SODIUM CHLORIDE 0.9 % IV SOLN
10.0000 mL/h | Freq: Once | INTRAVENOUS | Status: AC
  Administered 2020-02-04: 12:00:00 10 mL/h via INTRAVENOUS

## 2020-02-04 MED ORDER — MUPIROCIN 2 % EX OINT
1.0000 "application " | TOPICAL_OINTMENT | Freq: Two times a day (BID) | CUTANEOUS | Status: DC
  Administered 2020-02-04 – 2020-02-09 (×9): 1 via NASAL
  Filled 2020-02-04 (×3): qty 22

## 2020-02-04 MED ORDER — PIPERACILLIN-TAZOBACTAM 3.375 G IVPB
3.3750 g | Freq: Three times a day (TID) | INTRAVENOUS | Status: DC
  Filled 2020-02-04 (×3): qty 50

## 2020-02-04 MED ORDER — APIXABAN 5 MG PO TABS
5.0000 mg | ORAL_TABLET | Freq: Two times a day (BID) | ORAL | Status: DC
  Administered 2020-02-04 – 2020-02-06 (×5): 5 mg via ORAL
  Filled 2020-02-04 (×5): qty 1

## 2020-02-04 MED ORDER — GABAPENTIN 800 MG PO TABS
800.0000 mg | ORAL_TABLET | Freq: Three times a day (TID) | ORAL | Status: DC
  Filled 2020-02-04 (×2): qty 1

## 2020-02-04 MED ORDER — ALBUMIN HUMAN 5 % IV SOLN
25.0000 g | Freq: Once | INTRAVENOUS | Status: AC
  Administered 2020-02-04: 22:00:00 25 g via INTRAVENOUS
  Filled 2020-02-04: qty 250

## 2020-02-04 MED ORDER — GABAPENTIN 800 MG PO TABS: 800.0000 mg | ORAL_TABLET | Freq: Three times a day (TID) | ORAL | Status: DC

## 2020-02-04 MED ORDER — ACETAMINOPHEN 500 MG PO TABS
1000.0000 mg | ORAL_TABLET | ORAL | Status: DC | PRN
  Filled 2020-02-04: qty 2

## 2020-02-04 MED ORDER — LACTATED RINGERS IV SOLN: INTRAVENOUS | Status: DC

## 2020-02-04 MED ORDER — ACETAMINOPHEN 500 MG PO TABS
1000.0000 mg | ORAL_TABLET | Freq: Four times a day (QID) | ORAL | Status: DC | PRN
  Administered 2020-02-04 – 2020-02-06 (×2): 1000 mg via ORAL
  Filled 2020-02-04 (×2): qty 2

## 2020-02-04 MED ORDER — TRAMADOL HCL 50 MG PO TABS: 50.0000 mg | ORAL_TABLET | Freq: Three times a day (TID) | ORAL | Status: DC | PRN

## 2020-02-04 MED ORDER — OXYCODONE HCL 5 MG PO TABS
5.0000 mg | ORAL_TABLET | Freq: Four times a day (QID) | ORAL | Status: DC | PRN
  Administered 2020-02-04 – 2020-02-06 (×2): 5 mg via ORAL
  Filled 2020-02-04 (×3): qty 1

## 2020-02-04 MED ORDER — PANTOPRAZOLE SODIUM 40 MG PO TBEC
40.0000 mg | DELAYED_RELEASE_TABLET | Freq: Two times a day (BID) | ORAL | Status: DC
  Administered 2020-02-05 – 2020-02-06 (×4): 40 mg via ORAL
  Filled 2020-02-04 (×6): qty 1

## 2020-02-04 MED ORDER — PIPERACILLIN-TAZOBACTAM 3.375 G IVPB
3.3750 g | Freq: Three times a day (TID) | INTRAVENOUS | Status: DC
  Administered 2020-02-04 – 2020-02-07 (×8): 3.375 g via INTRAVENOUS
  Filled 2020-02-04 (×9): qty 50

## 2020-02-04 MED ORDER — ONDANSETRON HCL 4 MG PO TABS
4.0000 mg | ORAL_TABLET | Freq: Three times a day (TID) | ORAL | Status: DC | PRN
  Administered 2020-02-04: 22:00:00 4 mg via ORAL
  Filled 2020-02-04: qty 1

## 2020-02-04 MED ORDER — INSULIN ASPART 100 UNIT/ML ~~LOC~~ SOLN: 0.0000 [IU] | Freq: Every day | SUBCUTANEOUS | Status: DC

## 2020-02-04 MED ORDER — FENTANYL CITRATE (PF) 100 MCG/2ML IJ SOLN
50.0000 ug | Freq: Once | INTRAMUSCULAR | Status: AC
  Administered 2020-02-04: 12:00:00 50 ug via INTRAVENOUS
  Filled 2020-02-04: qty 2

## 2020-02-04 MED ORDER — TOPIRAMATE 25 MG PO TABS
50.0000 mg | ORAL_TABLET | Freq: Every day | ORAL | Status: DC
  Administered 2020-02-04 – 2020-02-09 (×4): 50 mg via ORAL
  Filled 2020-02-04 (×5): qty 2

## 2020-02-04 MED ORDER — INSULIN ASPART 100 UNIT/ML ~~LOC~~ SOLN: 0.0000 [IU] | Freq: Three times a day (TID) | SUBCUTANEOUS | Status: DC

## 2020-02-04 MED ORDER — SERTRALINE HCL 50 MG PO TABS
100.0000 mg | ORAL_TABLET | Freq: Two times a day (BID) | ORAL | Status: DC
  Administered 2020-02-04 – 2020-02-09 (×8): 100 mg via ORAL
  Filled 2020-02-04: qty 2
  Filled 2020-02-04: qty 1
  Filled 2020-02-04 (×2): qty 2
  Filled 2020-02-04: qty 1
  Filled 2020-02-04: qty 2
  Filled 2020-02-04: qty 1
  Filled 2020-02-04: qty 2

## 2020-02-04 MED ORDER — NORTRIPTYLINE HCL 25 MG PO CAPS
25.0000 mg | ORAL_CAPSULE | Freq: Every day | ORAL | Status: DC
  Administered 2020-02-04 – 2020-02-08 (×5): 25 mg via ORAL
  Filled 2020-02-04 (×8): qty 1

## 2020-02-04 NOTE — Sepsis Progress Note (Signed)
Notified bedside nurse of need to administer fluid bolus.  

## 2020-02-04 NOTE — Progress Notes (Signed)
CRITICAL VALUE ALERT  Critical Value:  Lactic acid 6  Date & Time Notied:  02/04/20 8:11 PM   Provider Notified: Lucile Shutters, MD  Orders Received/Actions taken: acknowledged

## 2020-02-04 NOTE — Progress Notes (Addendum)
[facility] Progress Note Patient Name: Laurelin Elson DOB: 1976-09-05 MRN: [id number]   Date of Service  02/04/2020  HPI/Events of Note  Lactate 6.0, serum albumin < 1.0  eICU Interventions  Albumin 5 % 25 gm iv x 1        Terre Hanneman U Pranavi Aure 02/04/2020, 8:36 PM

## 2020-02-04 NOTE — ED Notes (Signed)
Date and time results received: 02/04/20 0628 (use smartphrase ".now" to insert current time)  Test: hemoglobin Critical Value: 6.5  Name of Provider Notified: Dr. Roxanne Mins  Orders Received? Or Actions Taken?: yes, Dr. Roxanne Mins ordered 2 more units of PRBCs

## 2020-02-04 NOTE — ED Provider Notes (Signed)
After transfusion, hemoglobin has only gone up to 6.5.  We will give an additional 2 units of packed red blood cells.   Delora Fuel, MD 29/51/88 0630

## 2020-02-04 NOTE — Progress Notes (Signed)
MD advised:  Pt is nauseous & in excrutiating pain, requested PRNs.  She has also been without her home meds (specifically asked about gabapentin & Wellbutrin for anxiety) for ~2 days.   Pt also requesting Thiamine Sulfate IV while she's here to assist w/wound healing.  During admission assessment, cleansed most wounds w/ NS, repacked and replaced dressings.

## 2020-02-04 NOTE — Sepsis Progress Note (Signed)
Notified provider if we still  need more fluid bolus for this pt base on her weight  since LA went.

## 2020-02-05 ENCOUNTER — Inpatient Hospital Stay (HOSPITAL_COMMUNITY): Payer: BC Managed Care – PPO

## 2020-02-05 DIAGNOSIS — A419 Sepsis, unspecified organism: Secondary | ICD-10-CM | POA: Diagnosis not present

## 2020-02-05 LAB — TYPE AND SCREEN
ABO/RH(D): O POS
Antibody Screen: NEGATIVE
Unit division: 0
Unit division: 0
Unit division: 0
Unit division: 0

## 2020-02-05 LAB — LACTIC ACID, PLASMA
Lactic Acid, Venous: 2.9 mmol/L (ref 0.5–1.9)
Lactic Acid, Venous: 4.5 mmol/L (ref 0.5–1.9)

## 2020-02-05 LAB — BPAM RBC
Blood Product Expiration Date: 202104262359
Blood Product Expiration Date: 202104262359
Blood Product Expiration Date: 202104292359
Blood Product Expiration Date: 202104292359
ISSUE DATE / TIME: 202103231945
ISSUE DATE / TIME: 202103232122
ISSUE DATE / TIME: 202103240905
ISSUE DATE / TIME: 202103241209
Unit Type and Rh: 5100
Unit Type and Rh: 5100
Unit Type and Rh: 5100
Unit Type and Rh: 5100

## 2020-02-05 LAB — BASIC METABOLIC PANEL
Anion gap: 13 (ref 5–15)
BUN: 7 mg/dL (ref 6–20)
CO2: 19 mmol/L — ABNORMAL LOW (ref 22–32)
Calcium: 8.4 mg/dL — ABNORMAL LOW (ref 8.9–10.3)
Chloride: 99 mmol/L (ref 98–111)
Creatinine, Ser: 0.84 mg/dL (ref 0.44–1.00)
GFR calc Af Amer: >60 mL/min (ref >60)
GFR calc non Af Amer: >60 mL/min (ref >60)
Glucose, Bld: 73 mg/dL (ref 70–99)
Potassium: 4.1 mmol/L (ref 3.5–5.1)
Sodium: 131 mmol/L — ABNORMAL LOW (ref 135–145)

## 2020-02-05 LAB — CBC
HCT: 26.5 % — ABNORMAL LOW (ref 36.0–46.0)
Hemoglobin: 7.8 g/dL — ABNORMAL LOW (ref 12.0–15.0)
MCH: 27.4 pg (ref 26.0–34.0)
MCHC: 29.4 g/dL — ABNORMAL LOW (ref 30.0–36.0)
MCV: 93 fL (ref 80.0–100.0)
Platelets: 314 10*3/uL (ref 150–400)
RBC: 2.85 MIL/uL — ABNORMAL LOW (ref 3.87–5.11)
RDW: 20.2 % — ABNORMAL HIGH (ref 11.5–15.5)
WBC: 26.6 10*3/uL — ABNORMAL HIGH (ref 4.0–10.5)
nRBC: 0.1 % (ref 0.0–0.2)

## 2020-02-05 LAB — GLUCOSE, CAPILLARY
Glucose-Capillary: 79 mg/dL (ref 70–99)
Glucose-Capillary: 88 mg/dL (ref 70–99)
Glucose-Capillary: 107 mg/dL — ABNORMAL HIGH (ref 70–99)
Glucose-Capillary: 120 mg/dL — ABNORMAL HIGH (ref 70–99)

## 2020-02-05 LAB — URINE CULTURE: Culture: NO GROWTH

## 2020-02-05 LAB — MAGNESIUM: Magnesium: 1.6 mg/dL — ABNORMAL LOW (ref 1.7–2.4)

## 2020-02-05 LAB — PHOSPHORUS: Phosphorus: 4.2 mg/dL (ref 2.5–4.6)

## 2020-02-05 MED ORDER — DAKINS (1/4 STRENGTH) 0.125 % EX SOLN
Freq: Two times a day (BID) | CUTANEOUS | Status: DC
  Filled 2020-02-05: qty 473

## 2020-02-05 MED ORDER — ONDANSETRON HCL 4 MG/2ML IJ SOLN
4.0000 mg | Freq: Four times a day (QID) | INTRAMUSCULAR | Status: DC | PRN
  Administered 2020-02-05 – 2020-02-06 (×2): 4 mg via INTRAVENOUS
  Filled 2020-02-05 (×2): qty 2

## 2020-02-05 MED ORDER — DAKINS (1/4 STRENGTH) 0.125 % EX SOLN
Freq: Two times a day (BID) | CUTANEOUS | Status: AC
  Administered 2020-02-05: 1 via TOPICAL
  Filled 2020-02-05 (×3): qty 473

## 2020-02-05 MED ORDER — THIAMINE HCL 100 MG/ML IJ SOLN
100.0000 mg | Freq: Every day | INTRAMUSCULAR | Status: DC
  Administered 2020-02-05 – 2020-02-11 (×7): 100 mg via INTRAVENOUS
  Filled 2020-02-05 (×7): qty 2

## 2020-02-05 MED ORDER — SILVER SULFADIAZINE 1 % EX CREA
TOPICAL_CREAM | Freq: Two times a day (BID) | CUTANEOUS | Status: DC
  Administered 2020-02-05: 1 via TOPICAL
  Filled 2020-02-05 (×4): qty 85

## 2020-02-05 MED ORDER — MAGNESIUM SULFATE 4 GM/100ML IV SOLN
4.0000 g | Freq: Once | INTRAVENOUS | Status: AC
  Administered 2020-02-05: 14:00:00 4 g via INTRAVENOUS
  Filled 2020-02-05: qty 100

## 2020-02-05 MED ORDER — SODIUM CHLORIDE 0.9 % IV SOLN
INTRAVENOUS | Status: DC | PRN
  Administered 2020-02-05 – 2020-02-07 (×2): 250 mL via INTRAVENOUS

## 2020-02-05 MED ORDER — INSULIN ASPART 100 UNIT/ML ~~LOC~~ SOLN
0.0000 [IU] | Freq: Three times a day (TID) | SUBCUTANEOUS | Status: DC
  Administered 2020-02-10: 1 [IU] via SUBCUTANEOUS

## 2020-02-05 MED ORDER — MORPHINE SULFATE (PF) 2 MG/ML IV SOLN
2.0000 mg | INTRAVENOUS | Status: DC | PRN
  Administered 2020-02-05 (×2): 2 mg via INTRAVENOUS
  Administered 2020-02-05: 15:00:00 4 mg via INTRAVENOUS
  Administered 2020-02-05: 2 mg via INTRAVENOUS
  Administered 2020-02-06: 4 mg via INTRAVENOUS
  Administered 2020-02-06 – 2020-02-07 (×3): 2 mg via INTRAVENOUS
  Filled 2020-02-05: qty 2
  Filled 2020-02-05: qty 1
  Filled 2020-02-05: qty 2
  Filled 2020-02-05: qty 1
  Filled 2020-02-05: qty 2
  Filled 2020-02-05 (×2): qty 1

## 2020-02-05 NOTE — Progress Notes (Signed)
NAME:  Megan Ortiz, MRN:  [id number], DOB:  [birth year], LOS: 2 ADMISSION DATE:  01/16/2020, CONSULTATION DATE:  02/04/20 REFERRING MD:  [facility], CHIEF COMPLAINT:  weakness  Brief History   Patient with history of calciphylaxis presented to [facility] emergency room with complaints of general malaise.  History of present illness   44 year old morbidly obese female with history of calciphylaxis diagnosed several months ago at [facility], hx DVT/PE, chronic anemia, presents with weakness and general malaise.  On presentation patient was found to have a leukocytosis of 50,000 with a hemoglobin of 5.6, platelet count of 397, lactic acid 8.0.  Patient's creatinine is 0.85 calcium of 8.8 with a phosphorus of 5.3. Relative hypotension  Past Medical History  Nonuremic calciphylaxis without renal failure Microcytic anemia History of PE  and DVT status post IVC placement on Eliquis Deep chronic ulcer secondary to calciphylaxis Morbid obesity   Significant Hospital Events   Admission 02/04/2020  Consults:  Wound care  Procedures:  NA  Significant Diagnostic Tests:    Micro Data:  Blood 3/23 >>  Urine 3/24 >>   Antimicrobials:  Zosyn 3/23  >> Vancomycin 3/23 >>   Interim history/subjective:  Significant pain and nausea Tachycardic but otherwise hemodynamically stable  leukocytosis slightly improved  Objective   Blood pressure 104/67, pulse (!) 126, temperature 98.4 F (36.9 C), temperature source Oral, resp. rate 19, height 5\' 5"  (1.651 m), weight (!) 226.8 kg, SpO2 97 %.        Intake/Output Summary (Last 24 hours) at 02/05/2020 0932 Last data filed at 02/05/2020 0600 Gross per 24 hour  Intake 1964.62 ml  Output 1875 ml  Net 89.62 ml   Filed Weights   01/31/2020 1649  Weight: (!) 226.8 kg    Examination: General: Morbidly obese woman, laying comfortably in a bariatric bed, eating breakfast HENT: Oropharynx clear, edentulous Lungs: Distant but clear,  decreased at both bases Cardiovascular: Regular, tachycardic 130, distant, no murmur Abdomen: Nondistended, obese, hypoactive bowel sounds Extremities: Large no significant edema Skin: Decubitus wounds right buttock, left upper thigh, right hip, left buttock/hip unstageable with eschar.  Odor noted, drainage Neuro: Awake, alert, interacting appropriately, follows commands  Resolved Hospital Problem list   NA  Assessment & Plan:  Calciphylaxis with the wounds as described above: Most likely source of fever, sepsis Covered with broad-spectrum antibiotics currently, cultures pending, vancomycin, Zosyn Repeat blood cultures pending Greatly appreciate wound care consultation.  Dakin's moistened gauze into an overall wounds except the left buttock and bilateral lower extremities.  Areas with eschar twice daily application Silvadene and ABD/gauze Bariatric bed Suspect palliative care consultation for goals and also pain control may be necessary going forward pending L4   Chronic pain secondary to calciphylaxis:  Pain control ordered > tylenol, tramadol, oxycodone Add IV morphine on 3/25 Add thiamine IV at her request.  I do not see thiamine sulfate available (which was being given to promote wound healing at [facility]) but will use standard thiamine injection 100 mg  Microcytic anemia, anemia possible contributor to her relative hypotension at presentation: PRBC given, following hemoglobin, goal 7.0 No clear evidence of acute blood loss, no evidence of bleeding from wounds  Sepsis with profound leukocytosis: Source seems most likely secondary to extremity wounds.  She had weakness, fatigue, relative hypotension with elevated lactate, consistent w sepsis. Empiric broad-spectrum antibiotics as above pending culture data Follow lactic acid for clearance, improved to 2.9 Greatly appreciate wound care consultation and assistance  Severe weakness and  fatigue.  Improving.  Suspect related to sepsis and  severe anemia. Supportive care as outlined above  Anion gap acidosis secondary to lactic acidosis: Most likely some degree of volume depletion, early sepsis complicated by severe microcytic anemia Improved with restoration of adequate perfusion  Hx DVT and PE, with IVC filter Continue home Eliquis  Nausea Continue home regimen but add IV Zofran on 3/25  Atelectasis of left lower lobe: Push pulmonary hygiene, mobility as she is able to tolerate Follow intermittent chest x-ray  Best practice:  Diet: heart heathy  Pain/Anxiety/Delirium protocol (if indicated): As needed VAP protocol (if indicated): N/A DVT prophylaxis: Eliquis GI prophylaxis: N/A Glucose control: We will monitor Mobility: Bedrest Code Status: Full Family Communication: Updated patient in full 3/25 Disposition: ICU > can likely transition to progressive 3/25. Will ask TRH to assume her care as of 3/26  Labs   CBC: Recent Labs  Lab 01/30/2020 1756 02/04/20 0552 02/04/20 1921 02/05/20 0252  WBC 50.1* 39.3* 32.6* 26.6*  NEUTROABS 43.7*  --   --   --   HGB 5.6* 6.5* 8.0* 7.8*  HCT 20.6* 22.9* 27.1* 26.5*  MCV 99.0 94.2 94.4 93.0  PLT 397 337 316 009    Basic Metabolic Panel: Recent Labs  Lab 02/05/2020 1756 02/04/20 1921 02/05/20 0252  NA 133* 130* 131*  K 4.4 4.7 4.1  CL 101 99 99  CO2 19* 16* 19*  GLUCOSE 109* 108* 73  BUN 7 9 7   CREATININE 0.85 0.95 0.84  CALCIUM 8.0* 7.9* 8.4*  MG  --  1.6* 1.6*  PHOS  --  4.0 4.2   GFR: Estimated Creatinine Clearance: 168.5 mL/min (by C-G formula based on SCr of 0.84 mg/dL). Recent Labs  Lab 02/01/2020 1756 02/06/2020 1839 01/30/2020 2139 02/04/20 0552 02/04/20 1921 02/05/20 0252 02/05/20 0800  PROCALCITON  --   --   --   --  14.97  --   --   WBC 50.1*  --   --  39.3* 32.6* 26.6*  --   LATICACIDVEN  --  6.8* 8.0*  --  6.0*  --  2.9*    Liver Function Tests: Recent Labs  Lab 02/02/2020 1756 02/04/20 1921  AST 25 21  ALT 13 13  ALKPHOS 200* 191*    BILITOT 0.3 0.8  PROT 6.4* 5.7*  ALBUMIN 1.3* <1.0*   No results for input(s): LIPASE, AMYLASE in the last 168 hours. No results for input(s): AMMONIA in the last 168 hours.  ABG    Component Value Date/Time   PHART 7.371 05/30/2019 1655   PCO2ART 33.4 05/30/2019 1655   PO2ART 79.4 (L) 05/30/2019 1655   HCO3 18.9 (L) 05/30/2019 1655   TCO2 23 08/17/2019 1148   ACIDBASEDEF 5.4 (H) 05/30/2019 1655   O2SAT 96.2 05/30/2019 1655     Coagulation Profile: Recent Labs  Lab 01/15/2020 1839 02/04/20 1921  INR 1.4* 1.4*    Cardiac Enzymes: No results for input(s): CKTOTAL, CKMB, CKMBINDEX, TROPONINI in the last 168 hours.  HbA1C: Hgb A1c MFr Bld  Date/Time Value Ref Range Status  02/04/2020 07:21 PM 5.1 4.8 - 5.6 % Final    Comment:    (NOTE) Pre diabetes:          5.7%-6.4% Diabetes:              >6.4% Glycemic control for   <7.0% adults with diabetes   02/26/2017 04:52 AM 5.4 4.8 - 5.6 % Final    Comment:    (NOTE)  Pre-diabetes: 5.7 - 6.4         Diabetes: >6.4         Glycemic control for adults with diabetes: <7.0     CBG: Recent Labs  Lab 02/02/2020 1828 02/04/20 1509 02/04/20 2206 02/05/20 0722  GLUCAP 108* 86 100* 79     Critical care time: 33 minutes     Baltazar Apo, MD, PhD 02/05/2020, 9:32 AM [facility] [phone] or if no answer [phone]

## 2020-02-05 NOTE — Consult Note (Signed)
[place] Nurse Consult Note: Patient receiving care in [facility] 2M05.  Assisted with turning and positioning for wound assessment by team of 4 RNs and myself.  Patient screamed throughout assessment attempt. Reason for Consult: multiple wounds previously followed and diagnosed at calciphylaxis by dermatology at [facility]). Wound type: all wounds to right buttock, left upper thigh, right hip with exposed fat, full thickness, heavy odor, heavy drainage, primarily pink and slick.  These areas have been receiving care with 1/2 strength Dakin's solution per [facility] encounter documentation available in record.  The Left buttock/hip area has a large unstageable wound with brown eschar that I believe has been being treated with Silvadene Cream by the [facility].   There are small, few in number, scattered brown eschar on LLE, no drainage; they are dry.  These have been receiving Silvadene Cream. Pressure Injury POA: Yes/No/NA Measurement: The patient could not tolerate being in position long enough for measurement of each wound.  It would be very helpful to have photos of the wounds Wound bed: as described Drainage (amount, consistency, odor) as above Periwound: intact. Old wounds on BLE are scarred. Dressing procedure/placement/frequency: Place Dakin's moistened gauze into and over all wounds EXCEPT the left buttock and BLE.  Cover with ABD pads. Tape in place. Perform bid. For all areas with eschar, twice daily application of Silvadene and ABD/gauze. Monitor the wound area(s) for worsening of condition such as: Signs/symptoms of infection,  Increase in size,  Development of or worsening of odor, Development of pain, or increased pain at the affected locations.  Notify the medical team if any of these develop.  Thank you for the consult.  Discussed plan of care with the patient and bedside nurse.  [place] nurse will not follow at this time.  Please re-consult the [place] team if needed.  Val Riles,  RN, MSN, CWOCN, CNS-BC, pager [phone]

## 2020-02-05 NOTE — Plan of Care (Signed)

## 2020-02-05 NOTE — Discharge Instructions (Signed)

## 2020-02-06 DIAGNOSIS — A419 Sepsis, unspecified organism: Secondary | ICD-10-CM | POA: Diagnosis not present

## 2020-02-06 DIAGNOSIS — I10 Essential (primary) hypertension: Secondary | ICD-10-CM | POA: Diagnosis not present

## 2020-02-06 DIAGNOSIS — I82401 Acute embolism and thrombosis of unspecified deep veins of right lower extremity: Secondary | ICD-10-CM | POA: Diagnosis not present

## 2020-02-06 LAB — CBC
HCT: 28.4 % — ABNORMAL LOW (ref 36.0–46.0)
Hemoglobin: 8.3 g/dL — ABNORMAL LOW (ref 12.0–15.0)
MCH: 27.6 pg (ref 26.0–34.0)
MCHC: 29.2 g/dL — ABNORMAL LOW (ref 30.0–36.0)
MCV: 94.4 fL (ref 80.0–100.0)
Platelets: 357 10*3/uL (ref 150–400)
RBC: 3.01 MIL/uL — ABNORMAL LOW (ref 3.87–5.11)
RDW: 20.4 % — ABNORMAL HIGH (ref 11.5–15.5)
WBC: 29 10*3/uL — ABNORMAL HIGH (ref 4.0–10.5)
nRBC: 0.1 % (ref 0.0–0.2)

## 2020-02-06 LAB — BASIC METABOLIC PANEL
Anion gap: 14 (ref 5–15)
Anion gap: 13 (ref 5–15)
BUN: 11 mg/dL (ref 6–20)
BUN: 11 mg/dL (ref 6–20)
CO2: 19 mmol/L — ABNORMAL LOW (ref 22–32)
CO2: 19 mmol/L — ABNORMAL LOW (ref 22–32)
Calcium: 8.5 mg/dL — ABNORMAL LOW (ref 8.9–10.3)
Calcium: 8.4 mg/dL — ABNORMAL LOW (ref 8.9–10.3)
Chloride: 98 mmol/L (ref 98–111)
Chloride: 98 mmol/L (ref 98–111)
Creatinine, Ser: 0.94 mg/dL (ref 0.44–1.00)
Creatinine, Ser: 0.89 mg/dL (ref 0.44–1.00)
GFR calc Af Amer: >60 mL/min (ref >60)
GFR calc Af Amer: >60 mL/min (ref >60)
GFR calc non Af Amer: >60 mL/min (ref >60)
GFR calc non Af Amer: >60 mL/min (ref >60)
Glucose, Bld: 108 mg/dL — ABNORMAL HIGH (ref 70–99)
Glucose, Bld: 103 mg/dL — ABNORMAL HIGH (ref 70–99)
Potassium: 3.6 mmol/L (ref 3.5–5.1)
Potassium: 3.6 mmol/L (ref 3.5–5.1)
Sodium: 131 mmol/L — ABNORMAL LOW (ref 135–145)
Sodium: 130 mmol/L — ABNORMAL LOW (ref 135–145)

## 2020-02-06 LAB — VANCOMYCIN, TROUGH: Vancomycin Tr: 35 ug/mL (ref 15–20)

## 2020-02-06 LAB — GLUCOSE, CAPILLARY
Glucose-Capillary: 87 mg/dL (ref 70–99)
Glucose-Capillary: 117 mg/dL — ABNORMAL HIGH (ref 70–99)
Glucose-Capillary: 104 mg/dL — ABNORMAL HIGH (ref 70–99)

## 2020-02-06 LAB — MAGNESIUM
Magnesium: 2.2 mg/dL (ref 1.7–2.4)
Magnesium: 2.1 mg/dL (ref 1.7–2.4)

## 2020-02-06 LAB — VANCOMYCIN, PEAK: Vancomycin Pk: 56 ug/mL (ref 30–40)

## 2020-02-06 MED ORDER — METOPROLOL TARTRATE 5 MG/5ML IV SOLN
5.0000 mg | INTRAVENOUS | Status: DC | PRN
  Administered 2020-02-07: 09:00:00 5 mg via INTRAVENOUS
  Filled 2020-02-06 (×2): qty 5

## 2020-02-06 MED ORDER — VANCOMYCIN VARIABLE DOSE PER UNSTABLE RENAL FUNCTION (PHARMACIST DOSING): Status: DC

## 2020-02-06 MED ORDER — LACTATED RINGERS IV SOLN: INTRAVENOUS | Status: AC

## 2020-02-06 MED ORDER — VANCOMYCIN HCL 1750 MG/350ML IV SOLN
1750.0000 mg | INTRAVENOUS | Status: DC
  Administered 2020-02-07 – 2020-02-11 (×5): 1750 mg via INTRAVENOUS
  Filled 2020-02-06 (×7): qty 350

## 2020-02-06 NOTE — Plan of Care (Signed)

## 2020-02-06 NOTE — Progress Notes (Signed)
Pharmacy Antibiotic Note  Megan Ortiz is a 44 y.o. female admitted on 02/09/2020 with multiple decubitus ulcers to hips and legs.  Pharmacy has been consulted for Vancomyin dosing for cellulitis.  Pt is significantly obese with wt ~ 500lbs.   D#3 for cellulitis - afebrile, Tm 100.2  WBC decreased 26.6> now up to 29, LA down 6, PCT 15  Scr wnl 0.89 Vanc peak and trough at steady state checked last PM, this AM, results as follows:  3/25 VP 56 @2348  - dose @ 2104 (3/25) and, VT =35 @ 0751 (3/26) on vancomycin 2g IV q12h >  Calculated  AUC = 1068,  t1/2+ 11.9 hr, ke 0.058> decrease to 1750 mg q24h  Goal AUC is 400-550.  Pharmacy will adjust vancomycin dose to 1750mg  IV q24h , starting tonight 3/26 at 22:00   Plan: Decrease Vancomycin dose to 1750 mg q24h , est  AUC 467 SCr is 0.89 (from VP/VT done o 3/25)  Continue Zosyn EID 3.375gm IV Q8H Re check vanc levels at SS  (goal AUC 400-550)  Height: 5\' 5"  (165.1 cm) Weight: (!) 500 lb (226.8 kg) IBW/kg (Calculated) : 57  Temp (24hrs), Avg:99 F (37.2 C), Min:97.9 F (36.6 C), Max:100.2 F (37.9 C)  Recent Labs  Lab 01/24/2020 1756 01/22/2020 1839 02/02/2020 2139 02/04/20 0552 02/04/20 1921 02/05/20 0252 02/05/20 0800 02/05/20 1000 02/05/20 2348 02/06/20 0322 02/06/20 0751  WBC 50.1*  --   --  39.3* 32.6* 26.6*  --   --   --  29.0*  --   CREATININE 0.85  --   --   --  0.95 0.84  --   --  0.94 0.89  --   LATICACIDVEN  --  6.8* 8.0*  --  6.0*  --  2.9* 4.5*  --   --   --   VANCOTROUGH  --   --   --   --   --   --   --   --   --   --  35*  VANCOPEAK  --   --   --   --   --   --   --   --  56*  --   --     Estimated Creatinine Clearance: 159 mL/min (by C-G formula based on SCr of 0.89 mg/dL).    Allergies  Allergen Reactions  . Aspirin     Due to hx of stomach ulcers  . Coumarin     Antimicrobials this admission: Vanc 3/23 >> Zosyn 3/24 >> CTX x1 3/23  Dose adjustments this admission: 3/25 VP 56 @2348  - dose @ 2104  (3/25) and, VT =35 @ 0751 (3/26) on vancomycin 2g IV q12h >  Calc AUC 1068,  t1/2+ 11.9 hr, ke 0.058> decrease to 1750 mg q24h   Microbiology results: 3/23 BCx - NGTD 3/21 UCx - negative 3/23 covid / flu - negative 3/24 MRSA PCR - positive    Thank you for allowing pharmacy to be a part of this patient's care.  [employer], Pharm.D., BCPS Clinical Pharmacist Clinical phone for 02/06/2020 is [phone].  **Pharmacist phone directory can be found on [email or web address] listed under [place].  02/06/2020 10:30 AM

## 2020-02-06 NOTE — Progress Notes (Signed)
CRITICAL VALUE ALERT  Critical Value:  Vancomycin trough 35  Date & Time Notied: 0935  Provider Notified: M, Arrien  Orders Received/Actions taken: DC vancomycin

## 2020-02-06 NOTE — Progress Notes (Signed)
Pt has an inhaler in her possession, she refused to give it up with her others meds to be taken to pharmacy for safe keeping. She was advised not to use the inhaler while on admission. She was also reminded that all the meds she needs while on admission will be given to her by the hospital staff.

## 2020-02-06 NOTE — Progress Notes (Signed)
CRITICAL VALUE ALERT  Critical Value:  Vancomycin peak = 56  Date & Time Notied:  0145  Provider Notified: NP M. Sharlet Salina  Orders Received/Actions taken:

## 2020-02-06 NOTE — Progress Notes (Signed)
PROGRESS NOTE    Megan Ortiz  ZOX:[id number] DOB: 12/26/1975 DOA: 01/12/2020 PCP: Larene Beach, MD    Brief Narrative:  Patient was admitted to the hospital with a working diagnosis of sepsis due to lower extremity wounds, end-organ failure hypotension and lactic acidosis (present on admission),  44 year old female who presented with weakness.  She was transferred from [facility] due to lower extremity wounds.  She does have the past medical history for calciphylaxis, chronic anemia, history of PE/DVT, and obesity class III.  On her initial physical examination her blood pressure was 90/59, heart rate 122, temperature 99.9, respiratory rate 20, oxygen saturation 98%.  Her lungs are clear to auscultation bilaterally, heart S1-2 present, tachycardic, her abdomen was protuberant, she had decubitus wounds bilateral thighs, bilateral hips with dry dressings in place, right tight wound packed.  Sodium 133, potassium 4.4, chloride 101, bicarb 19, glucose 109, BUN 7, creatinine 0.85, AST 25, ALT 13, white count 50.1, hemoglobin 5.6, hematocrit 20.6, platelets 397.  SARS COVID-19 was negative.  Patient received broad-spectrum antibiotic therapy and local wound care.  Her hemodynamics improved and she was transferred from the ICU to [facility] on March 26.   Assessment & Plan:   Active Problems:   Benign essential HTN   Leg DVT (deep venous thromboembolism), acute, right ([place])   Morbid obesity ([place])   Sepsis ([place])   Calciphylaxis   Severe sepsis ([place])  1. Sepsis due to bilateral thighs and abdominal wound infection (in the setting of calciphylaxis), end organ failure hypotension and lactic acidosis, present on admission. Patient getting wound care and broad spectrum antibiotic therapy, wbc is down to 29, cultures no growth, screen positive for MRSA. Blood pressure 114/ 69, but positive tachycardia.   Will continue with antibiotic therapy and local wound care. Continue pain control.   2. Anemia. Multifactorial, will continue close monitoring of cell count, plan for prbc transfusion if hgb less than 7.   3. Hx of DVT., sp IV filter. Continue anticoagulation with apixaban.  4. Obesity class 3 with BMI more than 40. Will need outpatient follow up.   DVT prophylaxis: apixaban   Code Status:  full Family Communication: I spoke with patient's husband at the bedside, we talked in detail about patient's condition, plan of care and prognosis and all questions were addressed.  Disposition Plan/ discharge barriers: patient form home, barrier for dc continue need for IV antibiotic therapy and close hemodynamic monitoring.    Subjective: Patient continue to have pain at her lower extremity wounds, her mobility is very limited, no nausea or vomiting.   Objective: Vitals:   02/06/20 0800 02/06/20 0900 02/06/20 1200 02/06/20 1400  BP: 118/66  114/69   Pulse: (!) 124  (!) 123   Resp: 20 (!) 22 (!) 32 20  Temp: 98 F (36.7 C)  98.5 F (36.9 C)   TempSrc: Oral  Oral   SpO2: 92%  93%   Weight:      Height:        Intake/Output Summary (Last 24 hours) at 02/06/2020 1459 Last data filed at 02/06/2020 8119 Gross per 24 hour  Intake 1533.56 ml  Output 900 ml  Net 633.56 ml   Filed Weights   01/16/2020 1649  Weight: (!) 226.8 kg    Examination:   General: Not in pain or dyspnea, deconditioned  Neurology: Awake and alert, non focal  E ENT: no pallor, no icterus, oral mucosa moist Cardiovascular: No JVD. S1-S2 present, rhythmic, no gallops, rubs, or  murmurs. No lower extremity edema. Pulmonary: vesicular breath sounds bilaterally, adequate air movement, no wheezing, rhonchi or rales. Gastrointestinal. Abdomen protuberant with, no organomegaly, non tender, no rebound or guarding Skin. Several wounds bilateral thighs/ hips/ back/ dressing in place.  Musculoskeletal: no joint deformities     Data Reviewed: I have personally reviewed following labs and imaging  studies  CBC: Recent Labs  Lab 01/18/2020 1756 02/04/20 0552 02/04/20 1921 02/05/20 0252 02/06/20 0322  WBC 50.1* 39.3* 32.6* 26.6* 29.0*  NEUTROABS 43.7*  --   --   --   --   HGB 5.6* 6.5* 8.0* 7.8* 8.3*  HCT 20.6* 22.9* 27.1* 26.5* 28.4*  MCV 99.0 94.2 94.4 93.0 94.4  PLT 397 337 316 314 277   Basic Metabolic Panel: Recent Labs  Lab 02/04/2020 1756 02/04/20 1921 02/05/20 0252 02/05/20 2348 02/06/20 0322  NA 133* 130* 131* 131* 130*  K 4.4 4.7 4.1 3.6 3.6  CL 101 99 99 98 98  CO2 19* 16* 19* 19* 19*  GLUCOSE 109* 108* 73 108* 103*  BUN 7 9 7 11 11   CREATININE 0.85 0.95 0.84 0.94 0.89  CALCIUM 8.0* 7.9* 8.4* 8.5* 8.4*  MG  --  1.6* 1.6* 2.2 2.1  PHOS  --  4.0 4.2  --   --    GFR: Estimated Creatinine Clearance: 159 mL/min (by C-G formula based on SCr of 0.89 mg/dL). Liver Function Tests: Recent Labs  Lab 02/06/2020 1756 02/04/20 1921  AST 25 21  ALT 13 13  ALKPHOS 200* 191*  BILITOT 0.3 0.8  PROT 6.4* 5.7*  ALBUMIN 1.3* <1.0*   No results for input(s): LIPASE, AMYLASE in the last 168 hours. No results for input(s): AMMONIA in the last 168 hours. Coagulation Profile: Recent Labs  Lab 02/05/2020 1839 02/04/20 1921  INR 1.4* 1.4*   Cardiac Enzymes: No results for input(s): CKTOTAL, CKMB, CKMBINDEX, TROPONINI in the last 168 hours. BNP (last 3 results) No results for input(s): PROBNP in the last 8760 hours. HbA1C: Recent Labs    02/04/20 1921  HGBA1C 5.1   CBG: Recent Labs  Lab 02/05/20 0722 02/05/20 1130 02/05/20 1731 02/05/20 2115 02/06/20 1153  GLUCAP 79 88 107* 120* 87   Lipid Profile: No results for input(s): CHOL, HDL, LDLCALC, TRIG, CHOLHDL, LDLDIRECT in the last 72 hours. Thyroid Function Tests: No results for input(s): TSH, T4TOTAL, FREET4, T3FREE, THYROIDAB in the last 72 hours. Anemia Panel: No results for input(s): VITAMINB12, FOLATE, FERRITIN, TIBC, IRON, RETICCTPCT in the last 72 hours.    Radiology Studies: I have reviewed  all of the imaging during this hospital visit personally     Scheduled Meds: . apixaban  5 mg Oral BID  . Chlorhexidine Gluconate Cloth  6 each Topical Daily  . gabapentin  800 mg Oral TID  . insulin aspart  0-5 Units Subcutaneous QHS  . insulin aspart  0-9 Units Subcutaneous TID WC  . montelukast  10 mg Oral Daily  . mupirocin ointment  1 application Nasal BID  . nortriptyline  25 mg Oral QHS  . pantoprazole  40 mg Oral BID AC  . sertraline  100 mg Oral BID  . silver sulfADIAZINE   Topical BID  . sodium hypochlorite   Topical BID  . [START ON 01/31/2020] sodium hypochlorite   Topical BID  . thiamine injection  100 mg Intravenous Daily  . topiramate  50 mg Oral Daily   Continuous Infusions: . sodium chloride Stopped (02/06/20 0609)  . lactated ringers  50 mL/hr at 02/06/20 1437  . piperacillin-tazobactam (ZOSYN)  IV 3.375 g (02/06/20 1427)  . vancomycin       LOS: 3 days        Maeci Kalbfleisch Gerome Apley, MD

## 2020-02-07 ENCOUNTER — Inpatient Hospital Stay (HOSPITAL_COMMUNITY): Payer: BC Managed Care – PPO

## 2020-02-07 DIAGNOSIS — A419 Sepsis, unspecified organism: Secondary | ICD-10-CM | POA: Diagnosis not present

## 2020-02-07 DIAGNOSIS — G9341 Metabolic encephalopathy: Secondary | ICD-10-CM

## 2020-02-07 DIAGNOSIS — I10 Essential (primary) hypertension: Secondary | ICD-10-CM | POA: Diagnosis not present

## 2020-02-07 DIAGNOSIS — I82401 Acute embolism and thrombosis of unspecified deep veins of right lower extremity: Secondary | ICD-10-CM

## 2020-02-07 DIAGNOSIS — R6520 Severe sepsis without septic shock: Secondary | ICD-10-CM

## 2020-02-07 LAB — CBC WITH DIFFERENTIAL/PLATELET
Abs Immature Granulocytes: 1.78 10*3/uL — ABNORMAL HIGH (ref 0.00–0.07)
Basophils Absolute: 0.1 10*3/uL (ref 0.0–0.1)
Basophils Relative: 0 %
Eosinophils Absolute: 0.1 10*3/uL (ref 0.0–0.5)
Eosinophils Relative: 0 %
HCT: 27.2 % — ABNORMAL LOW (ref 36.0–46.0)
Hemoglobin: 8 g/dL — ABNORMAL LOW (ref 12.0–15.0)
Immature Granulocytes: 5 %
Lymphocytes Relative: 5 %
Lymphs Abs: 1.8 10*3/uL (ref 0.7–4.0)
MCH: 27.8 pg (ref 26.0–34.0)
MCHC: 29.4 g/dL — ABNORMAL LOW (ref 30.0–36.0)
MCV: 94.4 fL (ref 80.0–100.0)
Monocytes Absolute: 1.3 10*3/uL — ABNORMAL HIGH (ref 0.1–1.0)
Monocytes Relative: 4 %
Neutro Abs: 32.8 10*3/uL — ABNORMAL HIGH (ref 1.7–7.7)
Neutrophils Relative %: 86 %
Platelets: 428 10*3/uL — ABNORMAL HIGH (ref 150–400)
RBC: 2.88 MIL/uL — ABNORMAL LOW (ref 3.87–5.11)
RDW: 20.5 % — ABNORMAL HIGH (ref 11.5–15.5)
WBC: 37.9 10*3/uL — ABNORMAL HIGH (ref 4.0–10.5)
nRBC: 0.1 % (ref 0.0–0.2)

## 2020-02-07 LAB — POCT I-STAT EG7
Acid-base deficit: 4 mmol/L — ABNORMAL HIGH (ref 0.0–2.0)
Bicarbonate: 21.6 mmol/L (ref 20.0–28.0)
Calcium, Ion: 1.21 mmol/L (ref 1.15–1.40)
HCT: 25 % — ABNORMAL LOW (ref 36.0–46.0)
Hemoglobin: 8.5 g/dL — ABNORMAL LOW (ref 12.0–15.0)
O2 Saturation: 56 %
Patient temperature: 98.8
Potassium: 4.1 mmol/L (ref 3.5–5.1)
Sodium: 132 mmol/L — ABNORMAL LOW (ref 135–145)
TCO2: 23 mmol/L (ref 22–32)
pCO2, Ven: 39.1 mmHg — ABNORMAL LOW (ref 44.0–60.0)
pH, Ven: 7.35 (ref 7.250–7.430)
pO2, Ven: 31 mmHg — CL (ref 32.0–45.0)

## 2020-02-07 LAB — COMPREHENSIVE METABOLIC PANEL
ALT: 24 U/L (ref 0–44)
AST: 40 U/L (ref 15–41)
Albumin: 1 g/dL — ABNORMAL LOW (ref 3.5–5.0)
Alkaline Phosphatase: 168 U/L — ABNORMAL HIGH (ref 38–126)
Anion gap: 14 (ref 5–15)
BUN: 25 mg/dL — ABNORMAL HIGH (ref 6–20)
CO2: 18 mmol/L — ABNORMAL LOW (ref 22–32)
Calcium: 8.6 mg/dL — ABNORMAL LOW (ref 8.9–10.3)
Chloride: 99 mmol/L (ref 98–111)
Creatinine, Ser: 1.13 mg/dL — ABNORMAL HIGH (ref 0.44–1.00)
GFR calc Af Amer: >60 mL/min (ref >60)
GFR calc non Af Amer: 59 mL/min — ABNORMAL LOW (ref >60)
Glucose, Bld: 83 mg/dL (ref 70–99)
Potassium: 4.1 mmol/L (ref 3.5–5.1)
Sodium: 131 mmol/L — ABNORMAL LOW (ref 135–145)
Total Bilirubin: 1 mg/dL (ref 0.3–1.2)
Total Protein: 6.3 g/dL — ABNORMAL LOW (ref 6.5–8.1)

## 2020-02-07 LAB — BLOOD GAS, ARTERIAL
Acid-base deficit: 5.6 mmol/L — ABNORMAL HIGH (ref 0.0–2.0)
Bicarbonate: 19.1 mmol/L — ABNORMAL LOW (ref 20.0–28.0)
FIO2: 21
O2 Saturation: 96.4 %
Patient temperature: 37
pCO2 arterial: 36 mmHg (ref 32.0–48.0)
pH, Arterial: 7.345 — ABNORMAL LOW (ref 7.350–7.450)
pO2, Arterial: 86.6 mmHg (ref 83.0–108.0)

## 2020-02-07 LAB — BASIC METABOLIC PANEL
Anion gap: 13 (ref 5–15)
BUN: 18 mg/dL (ref 6–20)
CO2: 20 mmol/L — ABNORMAL LOW (ref 22–32)
Calcium: 8.8 mg/dL — ABNORMAL LOW (ref 8.9–10.3)
Chloride: 98 mmol/L (ref 98–111)
Creatinine, Ser: 0.78 mg/dL (ref 0.44–1.00)
GFR calc Af Amer: >60 mL/min (ref >60)
GFR calc non Af Amer: >60 mL/min (ref >60)
Glucose, Bld: 86 mg/dL (ref 70–99)
Potassium: 4 mmol/L (ref 3.5–5.1)
Sodium: 131 mmol/L — ABNORMAL LOW (ref 135–145)

## 2020-02-07 LAB — LACTIC ACID, PLASMA
Lactic Acid, Venous: 3.9 mmol/L (ref 0.5–1.9)
Lactic Acid, Venous: 3.6 mmol/L (ref 0.5–1.9)

## 2020-02-07 LAB — GLUCOSE, CAPILLARY
Glucose-Capillary: 92 mg/dL (ref 70–99)
Glucose-Capillary: 66 mg/dL — ABNORMAL LOW (ref 70–99)
Glucose-Capillary: 71 mg/dL (ref 70–99)
Glucose-Capillary: 69 mg/dL — ABNORMAL LOW (ref 70–99)
Glucose-Capillary: 78 mg/dL (ref 70–99)
Glucose-Capillary: 84 mg/dL (ref 70–99)

## 2020-02-07 LAB — AMMONIA: Ammonia: 51 umol/L — ABNORMAL HIGH (ref 9–35)

## 2020-02-07 LAB — BRAIN NATRIURETIC PEPTIDE: B Natriuretic Peptide: 235.5 pg/mL — ABNORMAL HIGH (ref 0.0–100.0)

## 2020-02-07 LAB — TSH: TSH: 3.939 u[IU]/mL (ref 0.350–4.500)

## 2020-02-07 LAB — CORTISOL: Cortisol, Plasma: 36.2 ug/dL

## 2020-02-07 MED ORDER — DEXTROSE IN LACTATED RINGERS 5 % IV SOLN: INTRAVENOUS | Status: DC

## 2020-02-07 MED ORDER — DEXTROSE 50 % IV SOLN: 12.5000 g | INTRAVENOUS | Status: AC

## 2020-02-07 MED ORDER — LACTATED RINGERS IV BOLUS
500.0000 mL | Freq: Once | INTRAVENOUS | Status: AC
  Administered 2020-02-07: 22:00:00 500 mL via INTRAVENOUS

## 2020-02-07 MED ORDER — METRONIDAZOLE 500 MG PO TABS: 500.0000 mg | ORAL_TABLET | Freq: Three times a day (TID) | ORAL | Status: DC

## 2020-02-07 MED ORDER — SODIUM CHLORIDE 0.9 % IV SOLN
2.0000 g | Freq: Three times a day (TID) | INTRAVENOUS | Status: DC
  Administered 2020-02-07 – 2020-02-11 (×14): 2 g via INTRAVENOUS
  Filled 2020-02-07 (×14): qty 2

## 2020-02-07 MED ORDER — NALOXONE HCL 0.4 MG/ML IJ SOLN
0.4000 mg | Freq: Once | INTRAMUSCULAR | Status: AC
  Administered 2020-02-07: 0.4 mg via INTRAVENOUS

## 2020-02-07 MED ORDER — ACETAMINOPHEN 650 MG RE SUPP
650.0000 mg | Freq: Four times a day (QID) | RECTAL | Status: DC | PRN
  Administered 2020-02-07: 13:00:00 650 mg via RECTAL

## 2020-02-07 MED ORDER — NALOXONE HCL 0.4 MG/ML IJ SOLN
INTRAMUSCULAR | Status: AC
  Filled 2020-02-07: qty 1

## 2020-02-07 MED ORDER — DEXTROSE 50 % IV SOLN
INTRAVENOUS | Status: AC
  Administered 2020-02-07: 13:00:00 50 mL via INTRAVENOUS
  Filled 2020-02-07: qty 50

## 2020-02-07 MED ORDER — SODIUM CHLORIDE 0.9% FLUSH: 10.0000 mL | INTRAVENOUS | Status: DC | PRN

## 2020-02-07 MED ORDER — SODIUM CHLORIDE 0.9% FLUSH
10.0000 mL | Freq: Two times a day (BID) | INTRAVENOUS | Status: DC
  Administered 2020-02-07 – 2020-02-11 (×9): 10 mL

## 2020-02-07 MED ORDER — ACETAMINOPHEN 325 MG PO TABS
650.0000 mg | ORAL_TABLET | ORAL | Status: DC | PRN
  Administered 2020-02-09: 650 mg via ORAL
  Filled 2020-02-07: qty 2

## 2020-02-07 MED ORDER — LACTATED RINGERS IV BOLUS
500.0000 mL | Freq: Once | INTRAVENOUS | Status: AC
  Administered 2020-02-07: 12:00:00 500 mL via INTRAVENOUS

## 2020-02-07 MED ORDER — LACTATED RINGERS IV SOLN: INTRAVENOUS | Status: DC

## 2020-02-07 MED ORDER — METRONIDAZOLE IN NACL 5-0.79 MG/ML-% IV SOLN
500.0000 mg | Freq: Three times a day (TID) | INTRAVENOUS | Status: DC
  Administered 2020-02-07: 15:00:00 500 mg via INTRAVENOUS
  Filled 2020-02-07: qty 100

## 2020-02-07 MED ORDER — LACTATED RINGERS IV BOLUS
500.0000 mL | Freq: Once | INTRAVENOUS | Status: AC
  Administered 2020-02-07: 09:00:00 500 mL via INTRAVENOUS

## 2020-02-07 MED ORDER — MORPHINE SULFATE (PF) 2 MG/ML IV SOLN: 1.0000 mg | INTRAVENOUS | Status: DC | PRN

## 2020-02-07 MED ORDER — DEXTROSE 50 % IV SOLN: 1.0000 | Freq: Once | INTRAVENOUS | Status: AC

## 2020-02-07 MED ORDER — VITAL HIGH PROTEIN PO LIQD: 1000.0000 mL | ORAL | Status: DC

## 2020-02-07 MED ORDER — CLINDAMYCIN PHOSPHATE 600 MG/50ML IV SOLN
600.0000 mg | Freq: Three times a day (TID) | INTRAVENOUS | Status: DC
  Administered 2020-02-07 – 2020-02-11 (×11): 600 mg via INTRAVENOUS
  Filled 2020-02-07 (×13): qty 50

## 2020-02-07 NOTE — Significant Event (Signed)
Rapid Response Event Note  Overview: ICU Transfer   Initial Focused Assessment: Called by nurse to inform me that patient will need to transfer back to the ICU and PCCM was consulted. I came up to see the patient, patient was more lethargic than she was this morning. I had a hard time keeping her awake. HR remained in the 120s, SBP 90-110s, now on 5L [place] - which is a new oxygen requirement. She received a total of 1L LR bolus this shift as well. Her sugars have been low and was placed D5LR at 50 cc as well. PCCM MD came to bedside. Per nurse, she noticed that the patient's pupils were different, LT was 29mm and RT was 3 mm - both reactive and brisk - I am not sure when this change was noticed. For me, both pupils seemed equal and reactive.   Interventions: -- STAT CT Head -- STAT CT A/P -- Transfer to ICU  Plan of Care: -- While in CT, after patient was flat on the table saturations dropped to 70% with a good pleth, I placed her on NRB 15L and saturations improved, transported to the MICU on NRB 15L. Once she was in the ICU, I was able to wean her down to 6L [place] - saturations were 98%.  -- Transferred to 2M06.   Event Summary:  Call Time 1656 Arrival Time [place]   Teyla Skidgel R

## 2020-02-07 NOTE — Plan of Care (Signed)
  Problem: Fluid Volume: Goal: Hemodynamic stability will improve 02/07/2020 1009 by Herma Ard, RN Outcome: Progressing 02/07/2020 1008 by Herma Ard, RN Outcome: Progressing   Problem: Clinical Measurements: Goal: Diagnostic test results will improve 02/07/2020 1009 by Herma Ard, RN Outcome: Progressing 02/07/2020 1008 by Herma Ard, RN Outcome: Progressing Goal: Signs and symptoms of infection will decrease 02/07/2020 1009 by Herma Ard, RN Outcome: Progressing 02/07/2020 1008 by Herma Ard, RN Outcome: Progressing   Problem: Respiratory: Goal: Ability to maintain adequate ventilation will improve 02/07/2020 1009 by Herma Ard, RN Outcome: Progressing 02/07/2020 1008 by Herma Ard, RN Outcome: Progressing

## 2020-02-07 NOTE — Progress Notes (Signed)
Patient continued to be hypotensive and tachycardic despite aggressive intravenous fluids.  Her mentation has mildly improved but continues to be very lethargic. Follow-up chest radiograph with positive bilateral interstitial infiltrates.  Septic shock due to multiple wounds.  1.  Antibiotic therapy has been modified to vancomycin, cefepime and metronidazole. 2.  New blood cultures were drawn 3.  For her refractive hypotension to intravenous fluids she will need vasopressors.  Patient will need to be transferred back to the intensive care.

## 2020-02-07 NOTE — Consult Note (Signed)
Reason for Consult: Soft tissue infection of pannus Referring Physician: [name] is an 44 y.o. female.  HPI: 44 year old female with morbid obesity, BMI 83.2 was admitted 4 days ago with multiple wounds with infection.  She has chronic wounds on her pannus, there are 2 large wounds on the left side.  She has various other wounds which were evaluated by the wound care service.  She was initially admitted by CCM then the hospitalist took over.  She developed a recurrent septic type picture and was transferred back to the ICU.  She underwent CT scan of her abdomen and pelvis which showed a lot of air down in her pannus concerning for necrotizing soft tissue infection.  This is contiguous with these large wounds that are packed with Kerlix.  She is a very poor historian and cannot answer any questions about her wounds at this time.  She is currently on Eliquis for history of pulmonary embolus.  Past Medical History:  Diagnosis Date  . Anemia   . Anemia   . Anxiety   . Asthma   . DVT (deep venous thrombosis) ([place])   . Gastric ulcer   . Headache   . Hiatal hernia   . Hypertension   . Necrotizing fasciitis ([place])   . PE (pulmonary embolism)   . Tachycardia     Past Surgical History:  Procedure Laterality Date  . CESAREAN SECTION    . CHOLECYSTECTOMY    . ESOPHAGOGASTRODUODENOSCOPY (EGD) WITH PROPOFOL N/A 05/25/2019   Procedure: ESOPHAGOGASTRODUODENOSCOPY (EGD) WITH PROPOFOL;  Surgeon: [name], MD;  Location: [facility] ENDOSCOPY;  Service: Endoscopy;  Laterality: N/A;  . INCISION AND DRAINAGE ABSCESS Left 02/16/2017   Procedure: DEBRIDEMENT LEFT BUTTOCK ABSCESS;  Surgeon: Fanny Skates, MD;  Location: [facility];  Service: General;  Laterality: Left;  . INSERTION OF DIALYSIS CATHETER Right 07/25/2019   Procedure: INSERTION OF TUNNEL DIALYSIS CATHETER;  Surgeon: Waynetta Sandy, MD;  Location: [facility];  Service: Vascular;  Laterality: Right;  . IRRIGATION AND DEBRIDEMENT  BUTTOCKS Left 02/19/2017   Procedure: DEBRIDEMENT OF GLUTEAL WOUND;  Surgeon: Rolm Bookbinder, MD;  Location: [facility] OR;  Service: General;  Laterality: Left;    Family History  Problem Relation Age of Onset  . Depression Mother   . Alcohol abuse Mother     Social History:  reports that she has quit smoking. Her smoking use included cigarettes. She has never used smokeless tobacco. She reports current alcohol use. She reports that she does not use drugs.  Allergies:  Allergies  Allergen Reactions  . Aspirin     Due to hx of stomach ulcers  . Coumarin     Medications: I have reviewed the patient's current medications.  Results for orders placed or performed during the hospital encounter of 01/27/2020 (from the past 48 hour(s))  Vancomycin, peak     Status: Abnormal   Collection Time: 02/05/20 11:48 PM  Result Value Ref Range   Vancomycin Pk 56 (HH) 30 - 40 ug/mL    Comment: RESULTS CONFIRMED BY MANUAL DILUTION CRITICAL RESULT CALLED TO, READ BACK BY AND VERIFIED WITH: ESSIEN I,RN 02/06/20 0136 WAYK Performed at [facility], [place] [address] [place], [place] [address]   Magnesium     Status: None   Collection Time: 02/05/20 11:48 PM  Result Value Ref Range   Magnesium 2.2 1.7 - 2.4 mg/dL    Comment: Performed at [facility], [place] [address], [place], [place] [address]  Basic metabolic panel     Status: Abnormal   Collection Time: 02/05/20 11:48 PM  Result Value Ref Range   Sodium 131 (L) 135 - 145 mmol/L   Potassium 3.6 3.5 - 5.1 mmol/L   Chloride 98 98 - 111 mmol/L   CO2 19 (L) 22 - 32 mmol/L   Glucose, Bld 108 (H) 70 - 99 mg/dL    Comment: Glucose reference range applies only to samples taken after fasting for at least 8 hours.   BUN 11 6 - 20 mg/dL   Creatinine, Ser 0.94 0.44 - 1.00 mg/dL   Calcium 8.5 (L) 8.9 - 10.3 mg/dL   GFR calc non Af Amer >60 >60 mL/min   GFR calc Af Amer >60 >60 mL/min   Anion gap 14 5 - 15    Comment: Performed at [facility] [address] [place], [place] [address]  Basic metabolic panel     Status: Abnormal   Collection Time: 02/06/20  3:22 AM  Result Value Ref Range   Sodium 130 (L) 135 - 145 mmol/L   Potassium 3.6 3.5 - 5.1 mmol/L   Chloride 98 98 - 111 mmol/L   CO2 19 (L) 22 - 32 mmol/L   Glucose, Bld 103 (H) 70 - 99 mg/dL    Comment: Glucose reference range applies only to samples taken after fasting for at least 8 hours.   BUN 11 6 - 20 mg/dL   Creatinine, Ser 0.89 0.44 - 1.00 mg/dL   Calcium 8.4 (L) 8.9 - 10.3 mg/dL   GFR calc non Af Amer >60 >60 mL/min   GFR calc Af Amer >60 >60 mL/min   Anion gap 13 5 - 15    Comment: Performed at [facility] [address] [place], [place] [address]  Magnesium     Status: None   Collection Time: 02/06/20  3:22 AM  Result Value Ref Range   Magnesium 2.1 1.7 - 2.4 mg/dL    Comment: Performed at [facility], [place] [address], [place], [place] [address]  CBC     Status: Abnormal   Collection Time: 02/06/20  3:22 AM  Result Value Ref Range   WBC 29.0 (H) 4.0 - 10.5 K/uL   RBC 3.01 (L) 3.87 - 5.11 MIL/uL   Hemoglobin 8.3 (L) 12.0 - 15.0 g/dL   HCT 28.4 (L) 36.0 - 46.0 %   MCV 94.4 80.0 - 100.0 fL   MCH 27.6 26.0 - 34.0 pg   MCHC 29.2 (L) 30.0 - 36.0 g/dL   RDW 20.4 (H) 11.5 - 15.5 %   Platelets 357 150 - 400 K/uL   nRBC 0.1 0.0 - 0.2 %    Comment: Performed at [facility], [place] [address] [place], [place] [address]  Vancomycin, trough     Status: Abnormal   Collection Time: 02/06/20  7:51 AM  Result Value Ref Range   Vancomycin Tr 35 (HH) 15 - 20 ug/mL    Comment: CRITICAL RESULT CALLED TO, READ BACK BY AND VERIFIED WITH: SANTIAGO,R RN @0925  ON [id number] BY FLEMINGS Performed at [facility] [address], [place], [place] [address]   Glucose, capillary     Status: None   Collection Time: 02/06/20 11:53 AM  Result Value Ref Range   Glucose-Capillary 87 70 - 99 mg/dL    Comment: Glucose reference range applies only  to samples taken after fasting for at least 8 hours.  Glucose, capillary  Status: Abnormal   Collection Time: 02/06/20  4:39 PM  Result Value Ref Range   Glucose-Capillary 117 (H) 70 - 99 mg/dL    Comment: Glucose reference range applies only to samples taken after fasting for at least 8 hours.  Glucose, capillary     Status: Abnormal   Collection Time: 02/06/20 10:19 PM  Result Value Ref Range   Glucose-Capillary 104 (H) 70 - 99 mg/dL    Comment: Glucose reference range applies only to samples taken after fasting for at least 8 hours.  CBC with Differential/Platelet     Status: Abnormal   Collection Time: 02/07/20  2:28 AM  Result Value Ref Range   WBC 37.9 (H) 4.0 - 10.5 K/uL   RBC 2.88 (L) 3.87 - 5.11 MIL/uL   Hemoglobin 8.0 (L) 12.0 - 15.0 g/dL   HCT 27.2 (L) 36.0 - 46.0 %   MCV 94.4 80.0 - 100.0 fL   MCH 27.8 26.0 - 34.0 pg   MCHC 29.4 (L) 30.0 - 36.0 g/dL   RDW 20.5 (H) 11.5 - 15.5 %   Platelets 428 (H) 150 - 400 K/uL   nRBC 0.1 0.0 - 0.2 %   Neutrophils Relative % 86 %   Neutro Abs 32.8 (H) 1.7 - 7.7 K/uL   Lymphocytes Relative 5 %   Lymphs Abs 1.8 0.7 - 4.0 K/uL   Monocytes Relative 4 %   Monocytes Absolute 1.3 (H) 0.1 - 1.0 K/uL   Eosinophils Relative 0 %   Eosinophils Absolute 0.1 0.0 - 0.5 K/uL   Basophils Relative 0 %   Basophils Absolute 0.1 0.0 - 0.1 K/uL   WBC Morphology MILD LEFT SHIFT (1-5% METAS, OCC MYELO, OCC BANDS)     Comment: VACUOLATED NEUTROPHILS   Immature Granulocytes 5 %   Abs Immature Granulocytes 1.78 (H) 0.00 - 0.07 K/uL   Polychromasia PRESENT     Comment: Performed at [facility], [place] [address], [place],  [address]  Basic metabolic panel     Status: Abnormal   Collection Time: 02/07/20  2:28 AM  Result Value Ref Range   Sodium 131 (L) 135 - 145 mmol/L   Potassium 4.0 3.5 - 5.1 mmol/L   Chloride 98 98 - 111 mmol/L   CO2 20 (L) 22 - 32 mmol/L   Glucose, Bld 86 70 - 99 mg/dL    Comment: Glucose reference range applies  only to samples taken after fasting for at least 8 hours.   BUN 18 6 - 20 mg/dL   Creatinine, Ser 0.78 0.44 - 1.00 mg/dL   Calcium 8.8 (L) 8.9 - 10.3 mg/dL   GFR calc non Af Amer >60 >60 mL/min   GFR calc Af Amer >60 >60 mL/min   Anion gap 13 5 - 15    Comment: Performed at [facility] [address] [place], [place] [address]  Glucose, capillary     Status: None   Collection Time: 02/07/20  8:14 AM  Result Value Ref Range   Glucose-Capillary 92 70 - 99 mg/dL    Comment: Glucose reference range applies only to samples taken after fasting for at least 8 hours.  Lactic acid, plasma     Status: Abnormal   Collection Time: 02/07/20  9:03 AM  Result Value Ref Range   Lactic Acid, Venous 3.9 (HH) 0.5 - 1.9 mmol/L    Comment: CRITICAL VALUE NOTED.  VALUE IS CONSISTENT WITH PREVIOUSLY REPORTED AND CALLED VALUE. Performed at [facility], [address], [place], [place] [address]   Blood gas, arterial     Status: Abnormal   Collection Time: 02/07/20  9:18 AM  Result Value Ref Range   FIO2 21.00    pH, Arterial 7.345 (L) 7.350 - 7.450   pCO2 arterial 36.0 32.0 - 48.0 mmHg   pO2, Arterial 86.6 83.0 - 108.0 mmHg   Bicarbonate 19.1 (L) 20.0 - 28.0 mmol/L   Acid-base deficit 5.6 (H) 0.0 - 2.0 mmol/L   O2 Saturation 96.4 %   Patient temperature 37.0    Collection site LEFT RADIAL    Drawn by COLLECTED BY RT     Comment: [id number]   Sample type ARTERIAL DRAW    Allens test (pass/fail) PASS PASS    Comment: Performed at [facility], [place] [address] [place], [place] [address]  Lactic acid, plasma     Status: Abnormal   Collection Time: 02/07/20 11:47 AM  Result Value Ref Range   Lactic Acid, Venous 3.6 (HH) 0.5 - 1.9 mmol/L    Comment: CRITICAL VALUE NOTED.  VALUE IS CONSISTENT WITH PREVIOUSLY REPORTED AND CALLED VALUE. Performed at [facility], [place] [address], [place], [place] [address]   Culture, blood (routine x 2)     Status: None (Preliminary result)    Collection Time: 02/07/20 12:56 PM   Specimen: BLOOD RIGHT WRIST  Result Value Ref Range   Specimen Description BLOOD RIGHT WRIST    Special Requests      BOTTLES DRAWN AEROBIC AND ANAEROBIC Blood Culture adequate volume   Culture      NO GROWTH < 12 HOURS Performed at [facility], [place] [address] [place], [place] [address]    Report Status PENDING   Glucose, capillary     Status: Abnormal   Collection Time: 02/07/20  1:03 PM  Result Value Ref Range   Glucose-Capillary 66 (L) 70 - 99 mg/dL    Comment: Glucose reference range applies only to samples taken after fasting for at least 8 hours.  Culture, blood (routine x 2)     Status: None (Preliminary result)   Collection Time: 02/07/20  1:07 PM   Specimen: BLOOD  Result Value Ref Range   Specimen Description BLOOD LEFT INDEX FINGER    Special Requests AEROBIC BOTTLE ONLY Blood Culture adequate volume    Culture      NO GROWTH < 12 HOURS Performed at [facility] [address], [place], [place] [address]    Report Status PENDING   Glucose, capillary     Status: None   Collection Time: 02/07/20  3:16 PM  Result Value Ref Range   Glucose-Capillary 71 70 - 99 mg/dL    Comment: Glucose reference range applies only to samples taken after fasting for at least 8 hours.  Glucose, capillary     Status: Abnormal   Collection Time: 02/07/20  4:21 PM  Result Value Ref Range   Glucose-Capillary 69 (L) 70 - 99 mg/dL    Comment: Glucose reference range applies only to samples taken after fasting for at least 8 hours.  Glucose, capillary     Status: None   Collection Time: 02/07/20  6:35 PM  Result Value Ref Range   Glucose-Capillary 78 70 - 99 mg/dL    Comment: Glucose reference range applies only to samples taken after fasting for at least 8 hours.  Cortisol     Status: None   Collection Time: 02/07/20  7:15  PM  Result Value Ref Range   Cortisol, Plasma 36.2 ug/dL    Comment: (NOTE) AM    6.7 - 22.6 ug/dL PM   <10.0        ug/dL Performed at [facility] [address] [place], [place] [address]   Brain natriuretic peptide     Status: Abnormal   Collection Time: 02/07/20  7:15 PM  Result Value Ref Range   B Natriuretic Peptide 235.5 (H) 0.0 - 100.0 pg/mL    Comment: Performed at [facility] [address], [place], [place] [address]  Comprehensive metabolic panel     Status: Abnormal   Collection Time: 02/07/20  7:15 PM  Result Value Ref Range   Sodium 131 (L) 135 - 145 mmol/L   Potassium 4.1 3.5 - 5.1 mmol/L   Chloride 99 98 - 111 mmol/L   CO2 18 (L) 22 - 32 mmol/L   Glucose, Bld 83 70 - 99 mg/dL    Comment: Glucose reference range applies only to samples taken after fasting for at least 8 hours.   BUN 25 (H) 6 - 20 mg/dL   Creatinine, Ser 1.13 (H) 0.44 - 1.00 mg/dL   Calcium 8.6 (L) 8.9 - 10.3 mg/dL   Total Protein 6.3 (L) 6.5 - 8.1 g/dL   Albumin 1.0 (L) 3.5 - 5.0 g/dL   AST 40 15 - 41 U/L   ALT 24 0 - 44 U/L   Alkaline Phosphatase 168 (H) 38 - 126 U/L   Total Bilirubin 1.0 0.3 - 1.2 mg/dL   GFR calc non Af Amer 59 (L) >60 mL/min   GFR calc Af Amer >60 >60 mL/min   Anion gap 14 5 - 15    Comment: Performed at [facility] [address], [place], [place] [address]  Ammonia     Status: Abnormal   Collection Time: 02/07/20  7:15 PM  Result Value Ref Range   Ammonia 51 (H) 9 - 35 umol/L    Comment: Performed at [facility], [place] [address], [place], [place] [address]  TSH     Status: None   Collection Time: 02/07/20  7:15 PM  Result Value Ref Range   TSH 3.939 0.350 - 4.500 uIU/mL    Comment: Performed by a 3rd Generation assay with a functional sensitivity of <=0.01 uIU/mL. Performed at [facility], [place] [address] [place], [place] [address]   Glucose, capillary     Status: None   Collection Time: 02/07/20 10:49 PM  Result Value Ref Range   Glucose-Capillary 84 70 - 99 mg/dL    Comment: Glucose reference range applies only to samples taken after  fasting for at least 8 hours.  POCT I-Stat EG7     Status: Abnormal   Collection Time: 02/07/20 11:14 PM  Result Value Ref Range   pH, Ven 7.350 7.250 - 7.430   pCO2, Ven 39.1 (L) 44.0 - 60.0 mmHg   pO2, Ven 31.0 (LL) 32.0 - 45.0 mmHg   Bicarbonate 21.6 20.0 - 28.0 mmol/L   TCO2 23 22 - 32 mmol/L   O2 Saturation 56.0 %   Acid-base deficit 4.0 (H) 0.0 - 2.0 mmol/L   Sodium 132 (L) 135 - 145 mmol/L   Potassium 4.1 3.5 - 5.1 mmol/L   Calcium, Ion 1.21 1.15 - 1.40 mmol/L   HCT 25.0 (L) 36.0 - 46.0 %   Hemoglobin 8.5 (L) 12.0 - 15.0 g/dL   Patient temperature  98.8 F    Sample type VENOUS    Comment NOTIFIED PHYSICIAN     CT ABDOMEN PELVIS WO CONTRAST  Result Date: 02/07/2020 CLINICAL DATA:  Sepsis.  Multiple deep soft tissue wounds EXAM: CT ABDOMEN AND PELVIS WITHOUT CONTRAST TECHNIQUE: Multidetector CT imaging of the abdomen and pelvis was performed following the standard protocol without IV contrast. COMPARISON:  CT dated May 29, 2019 FINDINGS: Lower chest: There are diffuse bilateral ground-glass airspace opacities.The heart is enlarged. There is a small to moderate-sized pericardial effusion. Hepatobiliary: There is decreased hepatic attenuation suggestive of hepatic steatosis. The liver is massively enlarged measuring approximately 31 cm craniocaudad. Status post cholecystectomy.There is no biliary ductal dilation. Pancreas: Not well evaluated secondary to the patient's body habitus. Spleen: The spleen is borderline enlarged. Adrenals/Urinary Tract: --Adrenal glands: No adrenal hemorrhage. --Right kidney/ureter: Not well evaluated secondary to the patient's body habitus. --Left kidney/ureter: Not well evaluated secondary to the patient's body habitus. --Urinary bladder: Not well evaluated. Stomach/Bowel: --Stomach/Duodenum: There is a large hiatal hernia. --Small bowel: Small bowel does not appear to be dilated but evaluation is significantly limited. --Colon: There appears to be an above  average amount of stool in the colon. Colon is poorly evaluated. --Appendix: Not visualized. Vascular/Lymphatic: There is an IVC filter in place with significant penetration outside the cava. There is a calcification at the level of the hepatic IVC which may represent chronic thrombus. --No retroperitoneal lymphadenopathy. --No mesenteric lymphadenopathy. Reproductive: Not well evaluated. Other: In the patient's low midline pannus, there are extensive inflammatory changes with pockets of subcutaneous gas and overlying skin thickening. There is no definite well-formed drainable fluid collection, however evaluation is limited by lack of IV contrast and the patient's body habitus. The abdominal wall is normal. Musculoskeletal. No acute displaced fractures. IMPRESSION: 1. Significant portions of the study are nearly nondiagnostic secondary to lack of IV contrast and the patient's body habitus. 2. There are pockets of subcutaneous gas with surrounding edema and overlying skin thickening involving the patient's pannus. These findings are concerning for a necrotizing infectious process in the appropriate clinical setting. There is no well-formed drainable fluid collection. 3. Massive hepatomegaly with hepatic steatosis. 4. Diffuse bilateral ground-glass airspace opacities concerning for an atypical infectious process. 5. Large hiatal hernia. 6. Borderline splenomegaly. 7. Cardiomegaly with a small to moderate-sized pericardial effusion. 8. IVC filter in place with significant caval penetration. These results will be called to the ordering clinician or representative by the Radiologist Assistant, and communication documented in the PACS or [employer]. Electronically Signed   By: Constance Holster M.D.   On: 02/07/2020 18:49   DG Chest 1 View  Result Date: 02/07/2020 CLINICAL DATA:  Dyspnea. EXAM: CHEST  1 VIEW COMPARISON:  Chest x-rays dated 02/05/2020 and 01/19/2020 and chest CT dated 05/29/2019 FINDINGS: The  patient has developed hazy bilateral pulmonary infiltrates, most prominent in the right upper lobe. There is slight pulmonary vascular prominence. Heart size is within normal limits considering the large chronic hiatal hernia which extensively 8 6 the mediastinal silhouette. No bone abnormality. IMPRESSION: New bilateral pulmonary infiltrates, most prominent in the right upper lobe. Electronically Signed   By: [name] M.D.   On: 02/07/2020 13:50   CT HEAD WO CONTRAST  Result Date: 02/07/2020 CLINICAL DATA:  Encephalopathy. Septic shock. EXAM: CT HEAD WITHOUT CONTRAST TECHNIQUE: Contiguous axial images were obtained from the base of the skull through the vertex without intravenous contrast. COMPARISON:  05/30/2019 FINDINGS: Brain: There is streak artifact through the  skull base from the patient's left shoulder limiting assessment of the posterior fossa and temporal lobes. Within this limitation, no acute infarct, intracranial hemorrhage, mass, midline shift, or extra-axial fluid collection is identified. The ventricles and sulci are normal. Vascular: No hyperdense vessel. Skull: No fracture or suspicious osseous lesion. Sinuses/Orbits: Partially visualized mild mucosal thickening inferiorly in the right maxillary sinus. Clear mastoid air cells. Chronic calcification posteriorly in both ocular globes. Other: None. IMPRESSION: No evidence of acute intracranial abnormality. Electronically Signed   By: Logan Bores M.D.   On: 02/07/2020 18:37   DG CHEST PORT 1 VIEW  Result Date: 02/07/2020 CLINICAL DATA:  Central line placement. EXAM: PORTABLE CHEST 1 VIEW COMPARISON:  Radiograph earlier this day, lung bases from abdominal CT earlier today. FINDINGS: Right internal jugular central venous catheter tip projects over the lower SVC. No pneumothorax. The patient is rotated. Cardiomegaly is not significantly changed. There is a retrocardiac hiatal hernia. Patchy bilateral lung opacities, slight suprahilar  prominence, unchanged from earlier today. No large pleural effusion. IMPRESSION: 1. Tip of the right internal jugular central venous catheter projects over the lower SVC. No pneumothorax. 2. Stable cardiomegaly. Patchy bilateral lung opacities, slight suprahilar prominence, unchanged from earlier today. Hiatal hernia. Electronically Signed   By: Keith Rake M.D.   On: 02/07/2020 23:16    Review of Systems  Unable to perform ROS: Mental status change   Blood pressure (!) 106/54, pulse (!) 124, temperature 98.6 F (37 C), temperature source Oral, resp. rate (!) 26, height 5\' 5"  (1.651 m), weight (!) 226.8 kg, SpO2 97 %. Physical Exam  Constitutional: She appears well-developed. No distress.  HENT:  Mouth/Throat: Oropharynx is clear and moist.  Eyes: Pupils are equal, round, and reactive to light. EOM are normal. Right eye exhibits no discharge. Left eye exhibits no discharge. No scleral icterus.  Cardiovascular: Regular rhythm and normal heart sounds.  Respiratory: Effort normal and breath sounds normal. No respiratory distress. She has no wheezes.  GI: Soft. She exhibits no distension. There is no rebound and no guarding.  Some erythema along the inferior portion of her pannus, 2 large wounds on the left side packed with a large amount of Kerlix.  Hepatomegaly.  Musculoskeletal:     Cervical back: Neck supple.  Neurological:  Awake but does not answer questions well  Skin:  See wound care notes  Psychiatric:  Alert but not oriented    Assessment/Plan: Pannus with 2 large chronic wounds and ongoing cellulitis -continue IV antibiotics, may require surgical debridement so please hold Eliquis at this time and do not start tube feeds.  There is a high risk for uncontrollable bleeding if we were to proceed with surgery at this time.  We will follow closely.  Zenovia Jarred 02/07/2020, 11:42 PM

## 2020-02-07 NOTE — Progress Notes (Signed)
Wound care performed at this time, pt tolerated well although she did have complaints of pain. PRN morphine given beforehand. No other needs at this time, will CTM.

## 2020-02-07 NOTE — Significant Event (Addendum)
Rapid Response Event Note  Overview: Follow Up  I called for a follow up and spoke with the CRN. Per CRN, patient spiked a temperature 101.3, given APAP PR, received another fluid bolus and Narcan 0.4 mg IV. LA 3.9>3.6. I saw that her blood sugar at 1303 was 66 - RN was getting ready to given some Dextrose.   I asked the nursing staff page the MD on call and update MD on patient being hypoglycemic. MD has been by to see the patient several times per staff as well  Call RRRN as needed    Atticus Wedin R

## 2020-02-07 NOTE — Progress Notes (Signed)
Pt transported to [facility] with RRT RN. After scan, pt taken to 2M06, bedside report given to RN. Husband in waiting room, RN will bring him to bedside once pt is settled.

## 2020-02-07 NOTE — Progress Notes (Signed)
PROGRESS NOTE    Megan Ortiz  MWN:[id number] DOB: Jun 15, 1976 DOA: 01/16/2020 PCP: Larene Beach, MD    Brief Narrative:  Patient was admitted to the hospital with a working diagnosis of sepsis due to lower extremity wounds, end-organ failure hypotension and lactic acidosis (present on admission),  44 year old female who presented with weakness.  She was transferred from [facility] due to lower extremity wounds.  She does have the past medical history for calciphylaxis, chronic anemia, history of PE/DVT, and obesity class III.  On her initial physical examination her blood pressure was 90/59, heart rate 122, temperature 99.9, respiratory rate 20, oxygen saturation 98%.  Her lungs are clear to auscultation bilaterally, heart S1-2 present, tachycardic, her abdomen was protuberant, she had decubitus wounds bilateral thighs, bilateral hips with dry dressings in place, right tight wound packed.  Sodium 133, potassium 4.4, chloride 101, bicarb 19, glucose 109, BUN 7, creatinine 0.85, AST 25, ALT 13, white count 50.1, hemoglobin 5.6, hematocrit 20.6, platelets 397.  SARS COVID-19 was negative.  Patient received broad-spectrum antibiotic therapy and local wound care.  Her hemodynamics improved and she was transferred from the ICU to [facility] on March 26.   Today patient is more lethargic, has been febrile and persistent tachycardia. Less responsive.   Assessment & Plan:   Active Problems:   Benign essential HTN   Leg DVT (deep venous thromboembolism), acute, right ([place])   Morbid obesity ([place])   Sepsis ([place])   Calciphylaxis   Severe sepsis ([place])   1. Sepsis due to bilateral thighs and abdominal wound infection (in the setting of calciphylaxis), end organ failure hypotension and lactic acidosis, present on admission.  This am patient with hypotension, febrile and persistent tachycardia, her mentation has deteriorated, now more lethargic. Wbc is up to 37.9, her blood cultures continue  with no growth. ABG with pH of 7.34, PCO2 36, Pa02 86 and bicarb is 19. Systolic blood pressure has been in the 90's.  She has multiple extensive and deep wounds lower extremities , abdomen and hips. Continue local wound care.   Will change Zosyn to Cefepime and metronidazole, and will continue IV vancomycin. Continue LR boluses to keep MAP more than 65 mmHg. Continue 100 ml per H/ LR. Will follow up on chest film and repeat blood cultures. Patient is on metoprolol for HR more than 130 bpm.   Patient critically ill, will continue aggressive medical management to prevent acute deterioration.   2. New metabolic encephalopathy. Seems to be multifactorial, patient received morphine at 4 am, 2 mg along with wound care. Will give one dose of Naloxone and continue close neuro check, aspiration precautions.    2. Anemia. Multifactorial, her Hgb is 8,0 with hct at 27, will continue close follow up of cell count.  3. Hx of DVT., sp IV filter. Anticoagulation with apixaban.  4. Obesity class 3 with BMI more than 40. Patient with high risk for hospital acquired complications.  Critical care 60 minutes.   DVT prophylaxis: apixaban   Code Status:  full Family Communication: I spoke with patient's husband at the bedside, we talked in detail about patient's condition, plan of care and prognosis and all questions were addressed.  Disposition Plan/ discharge barriers: patient form home, barrier for dc continue need for IV antibiotic therapy and close hemodynamic monitoring   Consultants:     Procedures:     Antimicrobials:   Vancomycin. Cefepime. metronidazole    Subjective: Patient this am very lethargic and hyporeactive, hypotensive and tachycardic.  Patient had 2 mg morphine at 4 am for wound care. No apparent pain this am.   Objective: Vitals:   02/07/20 0645 02/07/20 0800 02/07/20 0825 02/07/20 0838  BP:  96/68  94/60  Pulse: (!) 127 (!) 130  (!) 109  Resp:      Temp:  98.3 F  (36.8 C)    TempSrc:  Oral    SpO2: 92% (!) 88% 90% 90%  Weight:      Height:        Intake/Output Summary (Last 24 hours) at 02/07/2020 0854 Last data filed at 02/07/2020 0300 Gross per 24 hour  Intake 2939.34 ml  Output 2000 ml  Net 939.34 ml   Filed Weights   01/17/2020 1649  Weight: (!) 226.8 kg    Examination:   General: Not in pain or dyspnea, deconditioned  Neurology: somnolent and confused,   E GEZ:MOQHUTML pallor, no icterus, oral mucosa moist Cardiovascular: No JVD. S1-S2 present, rhythmic, no gallops, rubs, or murmurs. ++++ bilateral lower extremity edema, more right than left. Pulmonary: positive breath sounds bilaterally, adequate air movement, no wheezing, rhonchi or rales. Gastrointestinal. Abdomen  protuberant no organomegaly, non tender, no rebound or guarding Skin. No rashes Musculoskeletal: no joint deformities     Data Reviewed: I have personally reviewed following labs and imaging studies  CBC: Recent Labs  Lab 01/12/2020 1756 01/17/2020 1756 02/04/20 0552 02/04/20 1921 02/05/20 0252 02/06/20 0322 02/07/20 0228  WBC 50.1*   < > 39.3* 32.6* 26.6* 29.0* 37.9*  NEUTROABS 43.7*  --   --   --   --   --  32.8*  HGB 5.6*   < > 6.5* 8.0* 7.8* 8.3* 8.0*  HCT 20.6*   < > 22.9* 27.1* 26.5* 28.4* 27.2*  MCV 99.0   < > 94.2 94.4 93.0 94.4 94.4  PLT 397   < > 337 316 314 357 428*   < > = values in this interval not displayed.   Basic Metabolic Panel: Recent Labs  Lab 02/04/20 1921 02/05/20 0252 02/05/20 2348 02/06/20 0322 02/07/20 0228  NA 130* 131* 131* 130* 131*  K 4.7 4.1 3.6 3.6 4.0  CL 99 99 98 98 98  CO2 16* 19* 19* 19* 20*  GLUCOSE 108* 73 108* 103* 86  BUN 9 7 11 11 18   CREATININE 0.95 0.84 0.94 0.89 0.78  CALCIUM 7.9* 8.4* 8.5* 8.4* 8.8*  MG 1.6* 1.6* 2.2 2.1  --   PHOS 4.0 4.2  --   --   --    GFR: Estimated Creatinine Clearance: 176.9 mL/min (by C-G formula based on SCr of 0.78 mg/dL). Liver Function Tests: Recent Labs  Lab  01/25/2020 1756 02/04/20 1921  AST 25 21  ALT 13 13  ALKPHOS 200* 191*  BILITOT 0.3 0.8  PROT 6.4* 5.7*  ALBUMIN 1.3* <1.0*   No results for input(s): LIPASE, AMYLASE in the last 168 hours. No results for input(s): AMMONIA in the last 168 hours. Coagulation Profile: Recent Labs  Lab 02/09/2020 1839 02/04/20 1921  INR 1.4* 1.4*   Cardiac Enzymes: No results for input(s): CKTOTAL, CKMB, CKMBINDEX, TROPONINI in the last 168 hours. BNP (last 3 results) No results for input(s): PROBNP in the last 8760 hours. HbA1C: Recent Labs    02/04/20 1921  HGBA1C 5.1   CBG: Recent Labs  Lab 02/05/20 2115 02/06/20 1153 02/06/20 1639 02/06/20 2219 02/07/20 0814  GLUCAP 120* 87 117* 104* 92   Lipid Profile: No results for input(s): CHOL, HDL,  LDLCALC, TRIG, CHOLHDL, LDLDIRECT in the last 72 hours. Thyroid Function Tests: No results for input(s): TSH, T4TOTAL, FREET4, T3FREE, THYROIDAB in the last 72 hours. Anemia Panel: No results for input(s): VITAMINB12, FOLATE, FERRITIN, TIBC, IRON, RETICCTPCT in the last 72 hours.    Radiology Studies: I have reviewed all of the imaging during this hospital visit personally     Scheduled Meds: . apixaban  5 mg Oral BID  . Chlorhexidine Gluconate Cloth  6 each Topical Daily  . gabapentin  800 mg Oral TID  . insulin aspart  0-5 Units Subcutaneous QHS  . insulin aspart  0-9 Units Subcutaneous TID WC  . montelukast  10 mg Oral Daily  . mupirocin ointment  1 application Nasal BID  . nortriptyline  25 mg Oral QHS  . pantoprazole  40 mg Oral BID AC  . sertraline  100 mg Oral BID  . silver sulfADIAZINE   Topical BID  . sodium hypochlorite   Topical BID  . [START ON 01/17/2020] sodium hypochlorite   Topical BID  . thiamine injection  100 mg Intravenous Daily  . topiramate  50 mg Oral Daily   Continuous Infusions: . sodium chloride Stopped (02/06/20 0609)  . lactated ringers    . piperacillin-tazobactam (ZOSYN)  IV 3.375 g (02/07/20 0524)    . vancomycin 1,750 mg (02/07/20 0011)     LOS: 4 days        Audra Bellard Gerome Apley, MD \

## 2020-02-07 NOTE — Significant Event (Signed)
Rapid Response Event Note  Overview: Neurologic - Decreased LOC/AMS  Initial Focused Assessment: Called by nurse with concerns of patient being more lethargic and altered. Per nurse, she has seen the patient previously on the floor and she is normally alert/interactive and conversant. Upon my arrival, patient was quite lethargic, woke to my voice, only oriented to self/situation at best, able to follow commands, moves all extremities to commands. HR 120s - ST - not new, ongoing, afebrile - 98.5 (O), SBP 90-110s, MAP >65, RR normal - even - ETCO2 cannula applied reading 28-34. Lung sounds - diminished throughout. Skin warm but dry, + 3 pitting edema in RLE, + 1 in LLE, + extensive wounds on legs/back - being treated for infected wounds. Good capillary refill. Blood sugar was normal 92. Nurse had administered LR bolus 500 per MD. UOP from 0630-0900 ~ 200 cc. MD also ordered ABG and labs.  Interventions: -- NO RRT INTERVENTIONS  Plan of Care: -- I spoke with the MD on 5W as well - will closely monitor the patient for now.  -- ABG - uncompensated metabolic acidosis - MD aware -- Pending LA result -- Ongoing sepsis picture -- monitor VS closely and mental status.  -- Strict I/Os.  -- Received Narcan around 0200 for dressing change - could trial Narcan?  -- High risk for aspiration -- given AMS -- Keep HOB elevated, suction at bedside -- Monitor for signs of worsening shock - low UOP, hypotension, skin color changes, worsening mental status, etc.  -- Rest per MD  Event Summary:  Start Time 0920 End Time 0950   Stuart Mirabile R

## 2020-02-07 NOTE — Progress Notes (Signed)
NAME:  Megan Ortiz, MRN:  [id number], DOB:  Mar 28, 1976, LOS: 4 ADMISSION DATE:  01/29/2020, CONSULTATION DATE:  02/04/20 REFERRING MD:  [facility], CHIEF COMPLAINT:  weakness  Brief History   Patient with history of calciphylaxis presented to [facility] emergency room with complaints of general malaise, treated for sepsis of unk source.   History of present illness   44 year old morbidly obese female with history of calciphylaxis diagnosed several months ago at [facility], hx DVT/PE, chronic anemia, presents with weakness and general malaise.  On presentation patient was found to have a leukocytosis of 50,000 with a hemoglobin of 5.6, platelet count of 397, lactic acid 8.0.  Patient's creatinine is 0.85 calcium of 8.8 with a phosphorus of 5.3. Relative hypotension  In summary: Hypotension, WBC, anemia, lactate.   Past Medical History  Nonuremic calciphylaxis without renal failure Microcytic anemia History of PE  and DVT status post IVC placement on Eliquis Deep chronic ulcer secondary to calciphylaxis Morbid obesity BMI 83 HTN   Significant Hospital Events   Admission 02/04/2020  Consults:  Wound care  Procedures:  NA  Significant Diagnostic Tests:    Micro Data:  Blood 3/23 >>  Urine 3/24 >>  Blood cx 3/27  Antimicrobials:  Zosyn 3/23  >>3/26 Vancomycin 3/23 >>   Cefepime 3/27--> Flagyl 3/27 -->   Interim history/subjective:  Still tachycardic, intermittently lethargic, more hypotensive.   Objective   Blood pressure 113/60, pulse (!) 121, temperature (!) 100.7 F (38.2 C), temperature source Rectal, resp. rate (!) 24, height 5\' 5"  (1.651 m), weight (!) 226.8 kg, SpO2 96 %.        Intake/Output Summary (Last 24 hours) at 02/07/2020 1629 Last data filed at 02/07/2020 1418 Gross per 24 hour  Intake 2579.34 ml  Output 1750 ml  Net 829.34 ml   Filed Weights   01/21/2020 1649  Weight: (!) 226.8 kg    Examination: General: Morbidly obese woman, laying  comfortably in a bariatric bed, intermittenly drowsy HENT: Oropharynx clear, edentulous Lungs: Distant but clear, decreased at both bases Cardiovascular: Regular, tachycardic 130,no murmur Abdomen: Nondistended, obese, hypoactive bowel sounds Extremities: non pitting edema Skin: Decubitus wounds right buttock, left upper thigh, right hip, left buttock/hip unstageable with eschar.  Odor noted, drainage, no surrounding erythema Neuro: lethargic but arousable when stimulated.  L pupil slightly larger than R.  Both reactive.   Resolved Hospital Problem list   NA  Assessment & Plan:  Fever, hypotension, tachycardia: altered mental status.  All appear likely secondary to worsening sepsis.   Leukocytosis worsening today.  Only can give limited IV fluids given worsening pulmonary edema.  Broadened abtx.  Will transfer to ICU for closer monitoring.   Will likely need pressors.  Repeat blood cultures pending  Altered mental status: Likely 2/2 worsening sepsis.  Unk source.  Wounds appear stable on the surface.  Consider possible deeper infection, abscess?   CT head to r/o bleed or stroke (on anticoagulation).  Possible morphine worsening situation, unclear if responded to narcan (was being turned and stimulated after narcan given and was more awake at the time).  Check cortisol, CMP, TSH, Ammonia.    Calciphylaxis with the wounds as described above: Most likely source of fever, sepsis Wound care consulting.   Dakin's moistened gauze into an overall wounds except the left buttock and bilateral lower extremities.  Areas with eschar twice daily application Silvadene and ABD/gauze Bariatric bed  Chronic pain secondary to calciphylaxis:  Pain control ordered > tylenol,  tramadol, oxycodone - both held today.  Morphine prn.   Microcytic anemia, anemia possible contributor to her relative hypotension at presentation: PRBC given, following hemoglobin, goal 7.0 No clear evidence of acute blood  loss, no evidence of bleeding from wounds  Severe weakness and fatigue.  Improving.  Suspect related to sepsis and severe anemia. Supportive care as outlined above  Hx DVT and PE, with IVC filter Consider holding for possible procedures.  Cont eliquis vs heparin in am depending on stability.   Nausea Continue home regimen but add IV Zofran on 3/25  Atelectasis of left lower lobe: Push pulmonary hygiene, mobility as she is able to tolerate Follow intermittent chest x-ray  Best practice:  Diet: npo for now except sips as tolerates Pain/Anxiety/Delirium protocol (if indicated): As needed VAP protocol (if indicated): N/A DVT prophylaxis: Eliquis GI prophylaxis: N/A Glucose control: We will monitor Mobility: Bedrest Code Status: Full Family Communication: Updated patient and husband in full 3/25 Disposition: ICU  Labs   CBC: Recent Labs  Lab 02/09/2020 1756 01/13/2020 1756 02/04/20 0552 02/04/20 1921 02/05/20 0252 02/06/20 0322 02/07/20 0228  WBC 50.1*   < > 39.3* 32.6* 26.6* 29.0* 37.9*  NEUTROABS 43.7*  --   --   --   --   --  32.8*  HGB 5.6*   < > 6.5* 8.0* 7.8* 8.3* 8.0*  HCT 20.6*   < > 22.9* 27.1* 26.5* 28.4* 27.2*  MCV 99.0   < > 94.2 94.4 93.0 94.4 94.4  PLT 397   < > 337 316 314 357 428*   < > = values in this interval not displayed.    Basic Metabolic Panel: Recent Labs  Lab 02/04/20 1921 02/05/20 0252 02/05/20 2348 02/06/20 0322 02/07/20 0228  NA 130* 131* 131* 130* 131*  K 4.7 4.1 3.6 3.6 4.0  CL 99 99 98 98 98  CO2 16* 19* 19* 19* 20*  GLUCOSE 108* 73 108* 103* 86  BUN 9 7 11 11 18   CREATININE 0.95 0.84 0.94 0.89 0.78  CALCIUM 7.9* 8.4* 8.5* 8.4* 8.8*  MG 1.6* 1.6* 2.2 2.1  --   PHOS 4.0 4.2  --   --   --    GFR: Estimated Creatinine Clearance: 176.9 mL/min (by C-G formula based on SCr of 0.78 mg/dL). Recent Labs  Lab 02/04/20 1921 02/04/20 1921 02/05/20 0252 02/05/20 0800 02/05/20 1000 02/06/20 0322 02/07/20 0228 02/07/20 0903  02/07/20 1147  PROCALCITON 14.97  --   --   --   --   --   --   --   --   WBC 32.6*  --  26.6*  --   --  29.0* 37.9*  --   --   LATICACIDVEN 6.0*   < >  --  2.9* 4.5*  --   --  3.9* 3.6*   < > = values in this interval not displayed.    Liver Function Tests: Recent Labs  Lab 01/23/2020 1756 02/04/20 1921  AST 25 21  ALT 13 13  ALKPHOS 200* 191*  BILITOT 0.3 0.8  PROT 6.4* 5.7*  ALBUMIN 1.3* <1.0*   No results for input(s): LIPASE, AMYLASE in the last 168 hours. No results for input(s): AMMONIA in the last 168 hours.  ABG    Component Value Date/Time   PHART 7.345 (L) 02/07/2020 0918   PCO2ART 36.0 02/07/2020 0918   PO2ART 86.6 02/07/2020 0918   HCO3 19.1 (L) 02/07/2020 0918   TCO2 23 08/17/2019 1148  ACIDBASEDEF 5.6 (H) 02/07/2020 0918   O2SAT 96.4 02/07/2020 0918     Coagulation Profile: Recent Labs  Lab 01/26/2020 1839 02/04/20 1921  INR 1.4* 1.4*    Cardiac Enzymes: No results for input(s): CKTOTAL, CKMB, CKMBINDEX, TROPONINI in the last 168 hours.  HbA1C: Hgb A1c MFr Bld  Date/Time Value Ref Range Status  02/04/2020 07:21 PM 5.1 4.8 - 5.6 % Final    Comment:    (NOTE) Pre diabetes:          5.7%-6.4% Diabetes:              >6.4% Glycemic control for   <7.0% adults with diabetes   02/26/2017 04:52 AM 5.4 4.8 - 5.6 % Final    Comment:    (NOTE)         Pre-diabetes: 5.7 - 6.4         Diabetes: >6.4         Glycemic control for adults with diabetes: <7.0     CBG: Recent Labs  Lab 02/06/20 2219 02/07/20 0814 02/07/20 1303 02/07/20 1516 02/07/20 1621  GLUCAP 104* 92 66* 71 69*     Critical care time: 33 minutes     Collier Bullock MD PCCM

## 2020-02-07 NOTE — Progress Notes (Signed)
Pharmacy Antibiotic Note  Megan Ortiz is a 44 y.o. female admitted on 01/18/2020 with sepsis due to bilateral thighs and abdominal wound infection. Pharmacy has been consulted for vancomyin and zosyn dosing for cellulitis. Patient has been receiving Zosyn 3.375g IV q8h and vancomycin 1750mg  IV q24h. Today, patient's WBC increased to 37.9 and patient was febrile to 101.3. Zosyn is being changed to cefepime and pharmacy is consulted to dose.   Plan: Start cefepime 2g IV q8h  Continue vancomycin 1750mg  IV q24h  Monitor renal function, cultures/sensitivites, vancomycin levels, and clinical progression   Height: 5\' 5"  (165.1 cm) Weight: (!) 500 lb (226.8 kg) IBW/kg (Calculated) : 57  Temp (24hrs), Avg:99.2 F (37.3 C), Min:98.1 F (36.7 C), Max:101.3 F (38.5 C)  Recent Labs  Lab 01/23/2020 2139 02/04/20 0552 02/04/20 1921 02/05/20 0252 02/05/20 0800 02/05/20 1000 02/05/20 2348 02/06/20 0322 02/06/20 0751 02/07/20 0228 02/07/20 0903 02/07/20 1147  WBC  --  39.3* 32.6* 26.6*  --   --   --  29.0*  --  37.9*  --   --   CREATININE   < >  --  0.95 0.84  --   --  0.94 0.89  --  0.78  --   --   LATICACIDVEN   < >  --  6.0*  --  2.9* 4.5*  --   --   --   --  3.9* 3.6*  VANCOTROUGH  --   --   --   --   --   --   --   --  35*  --   --   --   VANCOPEAK  --   --   --   --   --   --  56*  --   --   --   --   --    < > = values in this interval not displayed.    Estimated Creatinine Clearance: 176.9 mL/min (by C-G formula based on SCr of 0.78 mg/dL).    Allergies  Allergen Reactions  . Aspirin     Due to hx of stomach ulcers  . Coumarin     Antimicrobials this admission: Vanc 3/23 >> Zosyn 3/24 >> 3/27 CTX x1 3/23 Cefepime 3/27 >>   Dose adjustments this admission: 3/25 VP 56 @2348  - dose @ 2104 (3/25) and, VT =35 @ 0751 (3/26) on vancomycin 2g IV q12h >  Calc AUC 1068,  t1/2+ 11.9 hr, ke 0.058> decrease to 1750 mg q24h   Microbiology results: 3/23 BCx - NGTD 3/21 UCx -  negative 3/23 covid / flu - negative 3/24 MRSA PCR - positive 3/27 Bcx:     Thank you for allowing pharmacy to be a part of this patient's care.  Cristela Felt, PharmD PGY1 Pharmacy Resident [employer]: [phone]  02/07/2020 12:36 PM

## 2020-02-07 NOTE — Progress Notes (Signed)
eLink Physician-Brief Progress Note Patient Name: Megan Ortiz DOB: Jan 14, 1976 MRN: [id number]   Date of Service  02/07/2020  HPI/Events of Note  Pt transferred to the ICU due to altered mental status, she is not able to take PO medications due to her lethargy. Pt has been hypoglycemic.  eICU Interventions  NG tube insertion ordered for medication access,  D 5 NS increased from 50 to 100 ml/ hour.         Kerry Kass Abraham Margulies 02/07/2020, 11:35 PM

## 2020-02-07 NOTE — Progress Notes (Addendum)
Upon assessment, pt is very drowsy. She struggles to open her eyes to hold a conversation. Morning PO medication held d/t drowsiness. HR sustaining >130: PRN IV metoprolol given with improvement of HR to 110s.BP soft at 94/60.  Dr. Cathlean Sauer paged d/t concerns of patient condition. Informed him of LOC, vitals, and UOP (277mL overnight). He ordered LR bolus of 517mL, started at this time, stat lactic acid, and stated he will be ordering an ABG. Notified RT of ABG order. Bolus infusing at this time, will monitor VS closely.   0916- lab drew lactic acid and RT at bedside to get ABG.  39- RRT RN at bedside evaluated pt. No new orders. ABG resulted, notified Dr. Cathlean Sauer. Of note, +2 pitting edema present in RLE, not previously documented.  1045- pt had 658mL urine output  1130- BP 95/52, rechecked to be 96/57. Second 530mL LR bolus given at this time. Narcan given shortly after per MD order to treat potential lasting effects of IV morphine given overnight.  1230- pt turned to complete wound care. Rectal temp checked as pt felt warm but oral temp WNL. Rectal temp found to be 101.3. Tylenol given rectally at this time. Lab at bedside to draw blood cultures x2.   1300- BG checked to be 66, 1 amp D50 given. Portable CXR completed at 1330 and results show new bilateral pulm infiltrates.  1516- BG recheck 71, notified MD and changed IVF to contain dextrose, not much improvement of mental status, pressures have been low and fluctuating  1540- Rectal temp rechecked to be 100.7, pt packed with ice packs around neck and axillary regions. Husband at bedside at this time, updated him. Dr. Cathlean Sauer also at bedside, feels that pt will soon require pressors and ICU level of care. Critical care team to evaluate pt for possible ICU transfer. Unable to treat tachycardia with IV metoprolol d/t hypotension. No urine output since 1045 today, even with two IVF boluses.   1620- BG rechecked 69. D5LR infusing per MD orders.     Lowella Dell, RN 02/07/2020,8:52 AM

## 2020-02-08 ENCOUNTER — Inpatient Hospital Stay (HOSPITAL_COMMUNITY): Payer: BC Managed Care – PPO

## 2020-02-08 ENCOUNTER — Encounter (HOSPITAL_COMMUNITY): Payer: Self-pay | Admitting: Emergency Medicine

## 2020-02-08 ENCOUNTER — Encounter (HOSPITAL_COMMUNITY): Admission: EM | Disposition: E | Payer: Self-pay | Source: Home / Self Care | Attending: Emergency Medicine

## 2020-02-08 ENCOUNTER — Inpatient Hospital Stay (HOSPITAL_COMMUNITY): Payer: BC Managed Care – PPO | Admitting: Certified Registered"

## 2020-02-08 DIAGNOSIS — T884XXA Failed or difficult intubation, initial encounter: Secondary | ICD-10-CM

## 2020-02-08 DIAGNOSIS — J9601 Acute respiratory failure with hypoxia: Secondary | ICD-10-CM

## 2020-02-08 DIAGNOSIS — I313 Pericardial effusion (noninflammatory): Secondary | ICD-10-CM

## 2020-02-08 DIAGNOSIS — I10 Essential (primary) hypertension: Secondary | ICD-10-CM | POA: Diagnosis not present

## 2020-02-08 DIAGNOSIS — A419 Sepsis, unspecified organism: Secondary | ICD-10-CM | POA: Diagnosis not present

## 2020-02-08 DIAGNOSIS — I82401 Acute embolism and thrombosis of unspecified deep veins of right lower extremity: Secondary | ICD-10-CM | POA: Diagnosis not present

## 2020-02-08 HISTORY — PX: IRRIGATION AND DEBRIDEMENT ABSCESS: SHX5252

## 2020-02-08 LAB — POCT I-STAT 7, (LYTES, BLD GAS, ICA,H+H)
Acid-base deficit: 7 mmol/L — ABNORMAL HIGH (ref 0.0–2.0)
Acid-base deficit: 4 mmol/L — ABNORMAL HIGH (ref 0.0–2.0)
Acid-base deficit: 4 mmol/L — ABNORMAL HIGH (ref 0.0–2.0)
Acid-base deficit: 5 mmol/L — ABNORMAL HIGH (ref 0.0–2.0)
Bicarbonate: 16.7 mmol/L — ABNORMAL LOW (ref 20.0–28.0)
Bicarbonate: 23.4 mmol/L (ref 20.0–28.0)
Bicarbonate: 23.4 mmol/L (ref 20.0–28.0)
Bicarbonate: 22.4 mmol/L (ref 20.0–28.0)
Calcium, Ion: 1.21 mmol/L (ref 1.15–1.40)
Calcium, Ion: 1.18 mmol/L (ref 1.15–1.40)
Calcium, Ion: 1.17 mmol/L (ref 1.15–1.40)
Calcium, Ion: 1.18 mmol/L (ref 1.15–1.40)
HCT: 24 % — ABNORMAL LOW (ref 36.0–46.0)
HCT: 23 % — ABNORMAL LOW (ref 36.0–46.0)
HCT: 24 % — ABNORMAL LOW (ref 36.0–46.0)
HCT: 25 % — ABNORMAL LOW (ref 36.0–46.0)
Hemoglobin: 8.2 g/dL — ABNORMAL LOW (ref 12.0–15.0)
Hemoglobin: 7.8 g/dL — ABNORMAL LOW (ref 12.0–15.0)
Hemoglobin: 8.2 g/dL — ABNORMAL LOW (ref 12.0–15.0)
Hemoglobin: 8.5 g/dL — ABNORMAL LOW (ref 12.0–15.0)
O2 Saturation: 91 %
O2 Saturation: 100 %
O2 Saturation: 100 %
O2 Saturation: 100 %
Patient temperature: 98.9
Patient temperature: 99.1
Potassium: 4.1 mmol/L (ref 3.5–5.1)
Potassium: 4.1 mmol/L (ref 3.5–5.1)
Potassium: 4 mmol/L (ref 3.5–5.1)
Potassium: 4 mmol/L (ref 3.5–5.1)
Sodium: 130 mmol/L — ABNORMAL LOW (ref 135–145)
Sodium: 133 mmol/L — ABNORMAL LOW (ref 135–145)
Sodium: 134 mmol/L — ABNORMAL LOW (ref 135–145)
Sodium: 133 mmol/L — ABNORMAL LOW (ref 135–145)
TCO2: 18 mmol/L — ABNORMAL LOW (ref 22–32)
TCO2: 25 mmol/L (ref 22–32)
TCO2: 25 mmol/L (ref 22–32)
TCO2: 24 mmol/L (ref 22–32)
pCO2 arterial: 26.1 mmHg — ABNORMAL LOW (ref 32.0–48.0)
pCO2 arterial: 57.8 mmHg — ABNORMAL HIGH (ref 32.0–48.0)
pCO2 arterial: 55.8 mmHg — ABNORMAL HIGH (ref 32.0–48.0)
pCO2 arterial: 55.5 mmHg — ABNORMAL HIGH (ref 32.0–48.0)
pH, Arterial: 7.416 (ref 7.350–7.450)
pH, Arterial: 7.216 — ABNORMAL LOW (ref 7.350–7.450)
pH, Arterial: 7.231 — ABNORMAL LOW (ref 7.350–7.450)
pH, Arterial: 7.215 — ABNORMAL LOW (ref 7.350–7.450)
pO2, Arterial: 59 mmHg — ABNORMAL LOW (ref 83.0–108.0)
pO2, Arterial: 304 mmHg — ABNORMAL HIGH (ref 83.0–108.0)
pO2, Arterial: 376 mmHg — ABNORMAL HIGH (ref 83.0–108.0)
pO2, Arterial: 322 mmHg — ABNORMAL HIGH (ref 83.0–108.0)

## 2020-02-08 LAB — BASIC METABOLIC PANEL
Anion gap: 15 (ref 5–15)
Anion gap: 11 (ref 5–15)
BUN: 29 mg/dL — ABNORMAL HIGH (ref 6–20)
BUN: 33 mg/dL — ABNORMAL HIGH (ref 6–20)
CO2: 16 mmol/L — ABNORMAL LOW (ref 22–32)
CO2: 20 mmol/L — ABNORMAL LOW (ref 22–32)
Calcium: 8.2 mg/dL — ABNORMAL LOW (ref 8.9–10.3)
Calcium: 7.7 mg/dL — ABNORMAL LOW (ref 8.9–10.3)
Chloride: 100 mmol/L (ref 98–111)
Chloride: 101 mmol/L (ref 98–111)
Creatinine, Ser: 1.09 mg/dL — ABNORMAL HIGH (ref 0.44–1.00)
Creatinine, Ser: 1.23 mg/dL — ABNORMAL HIGH (ref 0.44–1.00)
GFR calc Af Amer: >60 mL/min (ref >60)
GFR calc Af Amer: >60 mL/min (ref >60)
GFR calc non Af Amer: >60 mL/min (ref >60)
GFR calc non Af Amer: 53 mL/min — ABNORMAL LOW (ref >60)
Glucose, Bld: 83 mg/dL (ref 70–99)
Glucose, Bld: 124 mg/dL — ABNORMAL HIGH (ref 70–99)
Potassium: 4 mmol/L (ref 3.5–5.1)
Potassium: 3.9 mmol/L (ref 3.5–5.1)
Sodium: 131 mmol/L — ABNORMAL LOW (ref 135–145)
Sodium: 132 mmol/L — ABNORMAL LOW (ref 135–145)

## 2020-02-08 LAB — CBC
HCT: 25.3 % — ABNORMAL LOW (ref 36.0–46.0)
Hemoglobin: 7.3 g/dL — ABNORMAL LOW (ref 12.0–15.0)
MCH: 27.4 pg (ref 26.0–34.0)
MCHC: 28.9 g/dL — ABNORMAL LOW (ref 30.0–36.0)
MCV: 95.1 fL (ref 80.0–100.0)
Platelets: 472 10*3/uL — ABNORMAL HIGH (ref 150–400)
RBC: 2.66 MIL/uL — ABNORMAL LOW (ref 3.87–5.11)
RDW: 20.6 % — ABNORMAL HIGH (ref 11.5–15.5)
WBC: 40.9 10*3/uL — ABNORMAL HIGH (ref 4.0–10.5)
nRBC: 0.3 % — ABNORMAL HIGH (ref 0.0–0.2)

## 2020-02-08 LAB — GLUCOSE, CAPILLARY
Glucose-Capillary: 70 mg/dL (ref 70–99)
Glucose-Capillary: 87 mg/dL (ref 70–99)
Glucose-Capillary: 84 mg/dL (ref 70–99)
Glucose-Capillary: 84 mg/dL (ref 70–99)
Glucose-Capillary: 100 mg/dL — ABNORMAL HIGH (ref 70–99)
Glucose-Capillary: 108 mg/dL — ABNORMAL HIGH (ref 70–99)
Glucose-Capillary: 132 mg/dL — ABNORMAL HIGH (ref 70–99)

## 2020-02-08 LAB — CULTURE, BLOOD (ROUTINE X 2)
Culture: NO GROWTH
Culture: NO GROWTH
Special Requests: ADEQUATE
Special Requests: ADEQUATE

## 2020-02-08 LAB — ECHOCARDIOGRAM COMPLETE
Height: 65 in
Weight: 8000 oz

## 2020-02-08 LAB — MAGNESIUM
Magnesium: 2.2 mg/dL (ref 1.7–2.4)
Magnesium: 2.3 mg/dL (ref 1.7–2.4)

## 2020-02-08 LAB — PROTIME-INR
INR: 1.6 — ABNORMAL HIGH (ref 0.8–1.2)
Prothrombin Time: 19.2 seconds — ABNORMAL HIGH (ref 11.4–15.2)

## 2020-02-08 LAB — PHOSPHORUS: Phosphorus: 4.7 mg/dL — ABNORMAL HIGH (ref 2.5–4.6)

## 2020-02-08 LAB — TROPONIN I (HIGH SENSITIVITY): Troponin I (High Sensitivity): 15 ng/L (ref <18)

## 2020-02-08 LAB — LACTIC ACID, PLASMA
Lactic Acid, Venous: 3.4 mmol/L (ref 0.5–1.9)
Lactic Acid, Venous: 3.1 mmol/L (ref 0.5–1.9)

## 2020-02-08 LAB — HEPARIN LEVEL (UNFRACTIONATED): Heparin Unfractionated: 0.49 IU/mL (ref 0.30–0.70)

## 2020-02-08 LAB — APTT: aPTT: 34 seconds (ref 24–36)

## 2020-02-08 LAB — PREPARE RBC (CROSSMATCH)

## 2020-02-08 SURGERY — IRRIGATION AND DEBRIDEMENT ABSCESS
Anesthesia: General | Site: Abdomen

## 2020-02-08 MED ORDER — PHENYLEPHRINE 40 MCG/ML (10ML) SYRINGE FOR IV PUSH (FOR BLOOD PRESSURE SUPPORT)
PREFILLED_SYRINGE | INTRAVENOUS | Status: DC | PRN
  Administered 2020-02-08: 120 ug via INTRAVENOUS

## 2020-02-08 MED ORDER — ROCURONIUM BROMIDE 10 MG/ML (PF) SYRINGE
PREFILLED_SYRINGE | INTRAVENOUS | Status: AC
  Administered 2020-02-08: 16:00:00 100 mg
  Filled 2020-02-08: qty 10

## 2020-02-08 MED ORDER — PROPOFOL 10 MG/ML IV BOLUS
INTRAVENOUS | Status: AC
  Filled 2020-02-08: qty 20

## 2020-02-08 MED ORDER — DEXTROSE 10 % IV SOLN: INTRAVENOUS | Status: DC

## 2020-02-08 MED ORDER — ETOMIDATE 2 MG/ML IV SOLN
20.0000 mg | Freq: Once | INTRAVENOUS | Status: AC
  Administered 2020-02-08: 16:00:00 20 mg via INTRAVENOUS
  Filled 2020-02-08: qty 10

## 2020-02-08 MED ORDER — NOREPINEPHRINE 4 MG/250ML-% IV SOLN
0.0000 ug/min | INTRAVENOUS | Status: DC
  Administered 2020-02-08: 13:00:00 4 ug/min via INTRAVENOUS
  Administered 2020-02-08: 22:00:00 15 ug/min via INTRAVENOUS
  Administered 2020-02-09: 28 ug/min via INTRAVENOUS
  Filled 2020-02-08 (×3): qty 250

## 2020-02-08 MED ORDER — MIDAZOLAM HCL 2 MG/2ML IJ SOLN
INTRAMUSCULAR | Status: AC
  Filled 2020-02-08: qty 2

## 2020-02-08 MED ORDER — COLLAGENASE 250 UNIT/GM EX OINT
TOPICAL_OINTMENT | Freq: Every day | CUTANEOUS | Status: DC
  Filled 2020-02-08: qty 30

## 2020-02-08 MED ORDER — SODIUM CHLORIDE 0.9 % IV SOLN: INTRAVENOUS | Status: DC | PRN

## 2020-02-08 MED ORDER — VASOPRESSIN 20 UNIT/ML IV SOLN
INTRAVENOUS | Status: DC | PRN
  Administered 2020-02-08 (×3): 1 [IU] via INTRAVENOUS

## 2020-02-08 MED ORDER — ORAL CARE MOUTH RINSE
15.0000 mL | OROMUCOSAL | Status: DC
  Administered 2020-02-09 – 2020-02-11 (×29): 15 mL via OROMUCOSAL

## 2020-02-08 MED ORDER — ROCURONIUM BROMIDE 50 MG/5ML IV SOLN: 100.0000 mg | Freq: Once | INTRAVENOUS | Status: DC

## 2020-02-08 MED ORDER — FENTANYL CITRATE (PF) 100 MCG/2ML IJ SOLN
100.0000 ug | Freq: Once | INTRAMUSCULAR | Status: AC
  Administered 2020-02-08: 100 ug via INTRAVENOUS
  Filled 2020-02-08: qty 2

## 2020-02-08 MED ORDER — FENTANYL CITRATE (PF) 100 MCG/2ML IJ SOLN
INTRAMUSCULAR | Status: DC | PRN
  Administered 2020-02-08 (×2): 25 ug via INTRAVENOUS
  Administered 2020-02-08: 100 ug via INTRAVENOUS
  Administered 2020-02-08 (×2): 25 ug via INTRAVENOUS

## 2020-02-08 MED ORDER — STERILE WATER FOR IRRIGATION IR SOLN
Status: DC | PRN
  Administered 2020-02-08: 1000 mL

## 2020-02-08 MED ORDER — PROPOFOL 1000 MG/100ML IV EMUL: 0.0000 ug/kg/min | INTRAVENOUS | Status: DC

## 2020-02-08 MED ORDER — HEPARIN BOLUS VIA INFUSION
6000.0000 [IU] | Freq: Once | INTRAVENOUS | Status: AC
  Administered 2020-02-08: 6000 [IU] via INTRAVENOUS
  Filled 2020-02-08: qty 6000

## 2020-02-08 MED ORDER — SODIUM BICARBONATE 8.4 % IV SOLN
INTRAVENOUS | Status: DC | PRN
  Administered 2020-02-08 (×2): 50 meq via INTRAVENOUS

## 2020-02-08 MED ORDER — ALBUMIN HUMAN 5 % IV SOLN
12.5000 g | Freq: Once | INTRAVENOUS | Status: AC
  Administered 2020-02-08: 13:00:00 12.5 g via INTRAVENOUS
  Filled 2020-02-08: qty 250

## 2020-02-08 MED ORDER — ALBUMIN HUMAN 5 % IV SOLN: INTRAVENOUS | Status: DC | PRN

## 2020-02-08 MED ORDER — CHLORHEXIDINE GLUCONATE 0.12% ORAL RINSE (MEDLINE KIT)
15.0000 mL | Freq: Two times a day (BID) | OROMUCOSAL | Status: DC
  Administered 2020-02-08 – 2020-02-11 (×7): 15 mL via OROMUCOSAL

## 2020-02-08 MED ORDER — LACTATED RINGERS IV SOLN: INTRAVENOUS | Status: DC

## 2020-02-08 MED ORDER — HEPARIN (PORCINE) 25000 UT/250ML-% IV SOLN
2200.0000 [IU]/h | INTRAVENOUS | Status: DC
  Administered 2020-02-08: 12:00:00 2200 [IU]/h via INTRAVENOUS
  Filled 2020-02-08: qty 250

## 2020-02-08 MED ORDER — ROCURONIUM BROMIDE 50 MG/5ML IV SOSY
PREFILLED_SYRINGE | INTRAVENOUS | Status: DC | PRN
  Administered 2020-02-08: 50 mg via INTRAVENOUS
  Administered 2020-02-08: 30 mg via INTRAVENOUS

## 2020-02-08 MED ORDER — 0.9 % SODIUM CHLORIDE (POUR BTL) OPTIME
TOPICAL | Status: DC | PRN
  Administered 2020-02-08: 1000 mL

## 2020-02-08 MED ORDER — FENTANYL 2500MCG IN NS 250ML (10MCG/ML) PREMIX INFUSION
50.0000 ug/h | INTRAVENOUS | Status: DC
  Administered 2020-02-09: 50 ug/h via INTRAVENOUS
  Administered 2020-02-10: 75 ug/h via INTRAVENOUS
  Administered 2020-02-11: 100 ug/h via INTRAVENOUS
  Filled 2020-02-08 (×3): qty 250

## 2020-02-08 MED ORDER — MIDAZOLAM HCL 2 MG/2ML IJ SOLN
INTRAMUSCULAR | Status: DC | PRN
  Administered 2020-02-08: 2 mg via INTRAVENOUS
  Administered 2020-02-08 (×4): .5 mg via INTRAVENOUS

## 2020-02-08 MED ORDER — MORPHINE SULFATE (PF) 2 MG/ML IV SOLN
1.0000 mg | INTRAVENOUS | Status: DC | PRN
  Administered 2020-02-09: 2 mg via INTRAVENOUS
  Filled 2020-02-08: qty 1

## 2020-02-08 MED ORDER — FENTANYL CITRATE (PF) 250 MCG/5ML IJ SOLN
INTRAMUSCULAR | Status: AC
  Filled 2020-02-08: qty 5

## 2020-02-08 MED ORDER — VASOPRESSIN 20 UNIT/ML IV SOLN
0.0300 [IU]/min | INTRAVENOUS | Status: DC
  Administered 2020-02-09 – 2020-02-11 (×2): 0.03 [IU]/min via INTRAVENOUS
  Filled 2020-02-08 (×5): qty 2

## 2020-02-08 MED ORDER — FENTANYL BOLUS VIA INFUSION
50.0000 ug | INTRAVENOUS | Status: DC | PRN
  Administered 2020-02-09 – 2020-02-10 (×4): 50 ug via INTRAVENOUS
  Filled 2020-02-08: qty 50

## 2020-02-08 MED ORDER — FENTANYL CITRATE (PF) 100 MCG/2ML IJ SOLN
50.0000 ug | Freq: Once | INTRAMUSCULAR | Status: AC
  Administered 2020-02-09: 50 ug via INTRAVENOUS
  Filled 2020-02-08: qty 2

## 2020-02-08 SURGICAL SUPPLY — 36 items
APPLIER CLIP 11 MED OPEN (CLIP) ×3
BNDG GAUZE ELAST 4 BULKY (GAUZE/BANDAGES/DRESSINGS) ×3 IMPLANT
CANISTER SUCT 3000ML PPV (MISCELLANEOUS) ×3 IMPLANT
CLIP APPLIE 11 MED OPEN (CLIP) ×1 IMPLANT
COVER SURGICAL LIGHT HANDLE (MISCELLANEOUS) ×3 IMPLANT
DRAPE LAPAROSCOPIC ABDOMINAL (DRAPES) ×3 IMPLANT
DRSG PAD ABDOMINAL 8X10 ST (GAUZE/BANDAGES/DRESSINGS) ×3 IMPLANT
ELECT CAUTERY BLADE 6.4 (BLADE) ×3 IMPLANT
ELECT PAD DSPR THERM+ ADLT (MISCELLANEOUS) ×3 IMPLANT
ELECT REM PT RETURN 9FT ADLT (ELECTROSURGICAL) ×3
ELECTRODE REM PT RTRN 9FT ADLT (ELECTROSURGICAL) ×1 IMPLANT
GLOVE BIOGEL M STRL SZ7.5 (GLOVE) ×3 IMPLANT
GLOVE INDICATOR 8.0 STRL GRN (GLOVE) ×6 IMPLANT
GLOVE SURG SS PI 7.0 STRL IVOR (GLOVE) ×3 IMPLANT
GOWN STRL REUS W/ TWL LRG LVL3 (GOWN DISPOSABLE) ×1 IMPLANT
GOWN STRL REUS W/TWL 2XL LVL3 (GOWN DISPOSABLE) ×3 IMPLANT
GOWN STRL REUS W/TWL LRG LVL3 (GOWN DISPOSABLE) ×2
HOVERMATT SINGLE USE (MISCELLANEOUS) ×3 IMPLANT
KIT BASIN OR (CUSTOM PROCEDURE TRAY) ×3 IMPLANT
KIT TURNOVER KIT B (KITS) ×3 IMPLANT
MARKER SKIN DUAL TIP RULER LAB (MISCELLANEOUS) ×3 IMPLANT
NEEDLE 18GX1X1/2 (RX/OR ONLY) (NEEDLE) ×3 IMPLANT
NS IRRIG 1000ML POUR BTL (IV SOLUTION) ×3 IMPLANT
PACK GENERAL/GYN (CUSTOM PROCEDURE TRAY) ×3 IMPLANT
PAD ARMBOARD 7.5X6 YLW CONV (MISCELLANEOUS) ×3 IMPLANT
PENCIL SMOKE EVACUATOR (MISCELLANEOUS) ×3 IMPLANT
PROBE LAPAROSCOPIC 5MM W/FTSWT (MISCELLANEOUS) ×3 IMPLANT
SPONGE LAP 18X18 RF (DISPOSABLE) ×9 IMPLANT
SUT ETHILON 3 0 PS 1 (SUTURE) ×6 IMPLANT
SWAB COLLECTION DEVICE MRSA (MISCELLANEOUS) ×3 IMPLANT
SWAB CULTURE ESWAB REG 1ML (MISCELLANEOUS) ×3 IMPLANT
SYR 20ML LL LF (SYRINGE) ×3 IMPLANT
TAPE MEASURE VINYL STERILE (MISCELLANEOUS) ×3 IMPLANT
TAPE PAPER 3X10 WHT MICROPORE (GAUZE/BANDAGES/DRESSINGS) ×3 IMPLANT
TOWEL GREEN STERILE (TOWEL DISPOSABLE) ×3 IMPLANT
TOWEL GREEN STERILE FF (TOWEL DISPOSABLE) ×3 IMPLANT

## 2020-02-08 NOTE — Progress Notes (Addendum)
[place] Progress Note Patient Name: Megan Ortiz DOB: [birth year] MRN: [id number]   Date of Service  02/07/2020  HPI/Events of Note  Received a call from surgical team that patient had extensive debridement of necrotizing fascitis of pannus. Had 500 cc blood loss. Got 2 units RBC. Also had some initial issues with oxygenation but did well later. Is returning to OR tomorrow. Planned for vent/sedation and support overnight.   eICU Interventions  Saw her on camera Under anesthesia, on vent PRVC is set with VT 510, RR 28, peep 6, Fio2 100 IBW based maximum VT should be 460, asked RN to have RT lower this ABG in 30 min ordered CBC, BMP and mag ordered CXR to be done again Sedation ordered Patient is currently on Levophed at 6 mic/min and D10 infusions   Asked RN to let us know once labs and imaging done      Intervention Category Major Interventions: Respiratory failure - evaluation and management;Sepsis - evaluation and management Minor Interventions: Other:  Margaretmary Lombard 01/24/2020, 9:10 PM   Reviewed labs and ABG ABG - improving Ph from prior. Reduce Fio2. Repeat at 1 am CBC - Hb 7.0. Will repeat at 1 am to make sure not dropping further IN addition, also will start LR at 125 cc/hour  Repeat lactate at 1 am  CXR pending Please call E link with labs when done

## 2020-02-08 NOTE — Anesthesia Procedure Notes (Signed)
Arterial Line Insertion Start/End3/28/2021 6:19 PM Performed by: Albertha Ghee, MD, anesthesiologist  Patient location: OR. Preanesthetic checklist: patient identified, IV checked, site marked, risks and benefits discussed, surgical consent, monitors and equipment checked, pre-op evaluation, timeout performed and anesthesia consent Patient sedated Left, radial was placed Catheter size: 20 G Hand hygiene performed  and maximum sterile barriers used   Attempts: 1 Procedure performed without using ultrasound guided technique. Following insertion, dressing applied and Biopatch. Post procedure assessment: normal  Patient tolerated the procedure well with no immediate complications.

## 2020-02-08 NOTE — Brief Op Note (Signed)
01/19/2020 - 01/26/2020  8:17 PM  PATIENT:  Megan Ortiz  44 y.o. female  PRE-OPERATIVE DIAGNOSIS:  necrotizing abdominal wall  POST-OPERATIVE DIAGNOSIS:  necrotizing abdominal wall  PROCEDURE:  Procedure(s): EXCISIONAL DEBRIDEMENT OF NECROTIZING ABDOMINAL WALL PANNUS (N/A)/panniculectomy (57 cm x 16 cm x 15 cm)  SURGEON:  Surgeon(s) and Role:    Greer Pickerel, MD - Primary  PHYSICIAN ASSISTANT:   ASSISTANTS: none   ANESTHESIA:   general  EBL:  500 mL   BLOOD ADMINISTERED:2u CC PRBC  DRAINS: Nasogastric Tube   LOCAL MEDICATIONS USED:  NONE  SPECIMEN:  Source of Specimen:  cultures of purulent wound &2) skin,soft tissue, muscle  DISPOSITION OF SPECIMEN:  micro and path  COUNTS:  YES  TOURNIQUET:  * No tourniquets in log *  DICTATION: .Dragon Dictation  PLAN OF CARE: already inpatient  PATIENT DISPOSITION:  ICU - intubated and hemodynamically stable.   Delay start of Pharmacological VTE agent (>24hrs) due to surgical blood loss or risk of bleeding: YES!  Leighton Ruff. Redmond Pulling, MD, FACS General, Bariatric, & Minimally Invasive Surgery [facility], [place]

## 2020-02-08 NOTE — Progress Notes (Signed)
Patient ID: Megan Ortiz, female   DOB: 10/04/1976, 44 y.o.   MRN: [id number]       Subjective: Somnolent.  Wakes up some with dressing changes due to pain  ROS: unable due to mental acuity  Objective: Vital signs in last 24 hours: Temp:  [98.1 F (36.7 C)-101.3 F (38.5 C)] 100.1 F (37.8 C) (03/28 0748) Pulse Rate:  [118-133] 125 (03/28 0730) Resp:  [21-31] 26 (03/28 0730) BP: (81-134)/(35-74) 108/66 (03/28 0730) SpO2:  [90 %-100 %] 99 % (03/28 0730) Last BM Date: 02/05/20  Intake/Output from previous day: 03/27 0701 - 03/28 0700 In: 2796.2 [I.V.:931.8; IV Piggyback:1864.3] Out: 850 [Urine:850] Intake/Output this shift: No intake/output data recorded.  PE: Skin:  Multiple wounds, all 100% clean with no infection except left hip with 100% eschar.  This eschar is tight but no purulent drainage identified. See pictures:  Left abdominal wall   Right thigh   Right buttock   Left hip  Lab Results:  Recent Labs    02/07/20 0228 02/07/20 2314 01/21/2020 0348 02/01/2020 0449  WBC 37.9*  --  40.9*  --   HGB 8.0*   < > 7.3* 8.2*  HCT 27.2*   < > 25.3* 24.0*  PLT 428*  --  472*  --    < > = values in this interval not displayed.   BMET Recent Labs    02/07/20 0228 02/07/20 0228 02/07/20 1915 02/07/20 1915 02/07/20 2314 01/20/2020 0449  NA 131*   < > 131*   < > 132* 130*  K 4.0   < > 4.1   < > 4.1 4.1  CL 98  --  99  --   --   --   CO2 20*  --  18*  --   --   --   GLUCOSE 86  --  83  --   --   --   BUN 18  --  25*  --   --   --   CREATININE 0.78  --  1.13*  --   --   --   CALCIUM 8.8*  --  8.6*  --   --   --    < > = values in this interval not displayed.   PT/INR Recent Labs    01/23/2020 0802  LABPROT 19.2*  INR 1.6*   CMP     Component Value Date/Time   NA 130 (L) 01/23/2020 0449   NA 132 (A) 03/09/2017 0000   K 4.1 02/07/2020 0449   CL 99 02/07/2020 1915   CO2 18 (L) 02/07/2020 1915   GLUCOSE 83 02/07/2020 1915   BUN 25 (H) 02/07/2020 1915    BUN 10 03/09/2017 0000   CREATININE 1.13 (H) 02/07/2020 1915   CALCIUM 8.6 (L) 02/07/2020 1915   PROT 6.3 (L) 02/07/2020 1915   ALBUMIN 1.0 (L) 02/07/2020 1915   AST 40 02/07/2020 1915   ALT 24 02/07/2020 1915   ALKPHOS 168 (H) 02/07/2020 1915   BILITOT 1.0 02/07/2020 1915   GFRNONAA 59 (L) 02/07/2020 1915   GFRAA >60 02/07/2020 1915   Lipase     Component Value Date/Time   LIPASE 34 05/29/2019 1920       Studies/Results: CT ABDOMEN PELVIS WO CONTRAST  Result Date: 02/07/2020 CLINICAL DATA:  Sepsis.  Multiple deep soft tissue wounds EXAM: CT ABDOMEN AND PELVIS WITHOUT CONTRAST TECHNIQUE: Multidetector CT imaging of the abdomen and pelvis was performed following the standard protocol without IV contrast. COMPARISON:  CT dated May 29, 2019 FINDINGS: Lower chest: There are diffuse bilateral ground-glass airspace opacities.The heart is enlarged. There is a small to moderate-sized pericardial effusion. Hepatobiliary: There is decreased hepatic attenuation suggestive of hepatic steatosis. The liver is massively enlarged measuring approximately 31 cm craniocaudad. Status post cholecystectomy.There is no biliary ductal dilation. Pancreas: Not well evaluated secondary to the patient's body habitus. Spleen: The spleen is borderline enlarged. Adrenals/Urinary Tract: --Adrenal glands: No adrenal hemorrhage. --Right kidney/ureter: Not well evaluated secondary to the patient's body habitus. --Left kidney/ureter: Not well evaluated secondary to the patient's body habitus. --Urinary bladder: Not well evaluated. Stomach/Bowel: --Stomach/Duodenum: There is a large hiatal hernia. --Small bowel: Small bowel does not appear to be dilated but evaluation is significantly limited. --Colon: There appears to be an above average amount of stool in the colon. Colon is poorly evaluated. --Appendix: Not visualized. Vascular/Lymphatic: There is an IVC filter in place with significant penetration outside the cava. There  is a calcification at the level of the hepatic IVC which may represent chronic thrombus. --No retroperitoneal lymphadenopathy. --No mesenteric lymphadenopathy. Reproductive: Not well evaluated. Other: In the patient's low midline pannus, there are extensive inflammatory changes with pockets of subcutaneous gas and overlying skin thickening. There is no definite well-formed drainable fluid collection, however evaluation is limited by lack of IV contrast and the patient's body habitus. The abdominal wall is normal. Musculoskeletal. No acute displaced fractures. IMPRESSION: 1. Significant portions of the study are nearly nondiagnostic secondary to lack of IV contrast and the patient's body habitus. 2. There are pockets of subcutaneous gas with surrounding edema and overlying skin thickening involving the patient's pannus. These findings are concerning for a necrotizing infectious process in the appropriate clinical setting. There is no well-formed drainable fluid collection. 3. Massive hepatomegaly with hepatic steatosis. 4. Diffuse bilateral ground-glass airspace opacities concerning for an atypical infectious process. 5. Large hiatal hernia. 6. Borderline splenomegaly. 7. Cardiomegaly with a small to moderate-sized pericardial effusion. 8. IVC filter in place with significant caval penetration. These results will be called to the ordering clinician or representative by the Radiologist Assistant, and communication documented in the PACS or [employer]. Electronically Signed   By: Constance Holster M.D.   On: 02/07/2020 18:49   DG Chest 1 View  Result Date: 02/07/2020 CLINICAL DATA:  Dyspnea. EXAM: CHEST  1 VIEW COMPARISON:  Chest x-rays dated 02/05/2020 and 01/24/2020 and chest CT dated 05/29/2019 FINDINGS: The patient has developed hazy bilateral pulmonary infiltrates, most prominent in the right upper lobe. There is slight pulmonary vascular prominence. Heart size is within normal limits considering the  large chronic hiatal hernia which extensively 8 6 the mediastinal silhouette. No bone abnormality. IMPRESSION: New bilateral pulmonary infiltrates, most prominent in the right upper lobe. Electronically Signed   By: [name] M.D.   On: 02/07/2020 13:50   CT HEAD WO CONTRAST  Result Date: 02/07/2020 CLINICAL DATA:  Encephalopathy. Septic shock. EXAM: CT HEAD WITHOUT CONTRAST TECHNIQUE: Contiguous axial images were obtained from the base of the skull through the vertex without intravenous contrast. COMPARISON:  05/30/2019 FINDINGS: Brain: There is streak artifact through the skull base from the patient's left shoulder limiting assessment of the posterior fossa and temporal lobes. Within this limitation, no acute infarct, intracranial hemorrhage, mass, midline shift, or extra-axial fluid collection is identified. The ventricles and sulci are normal. Vascular: No hyperdense vessel. Skull: No fracture or suspicious osseous lesion. Sinuses/Orbits: Partially visualized mild mucosal thickening inferiorly in the right maxillary sinus. Clear mastoid  air cells. Chronic calcification posteriorly in both ocular globes. Other: None. IMPRESSION: No evidence of acute intracranial abnormality. Electronically Signed   By: Logan Bores M.D.   On: 02/07/2020 18:37   DG CHEST PORT 1 VIEW  Result Date: 02/07/2020 CLINICAL DATA:  Central line placement. EXAM: PORTABLE CHEST 1 VIEW COMPARISON:  Radiograph earlier this day, lung bases from abdominal CT earlier today. FINDINGS: Right internal jugular central venous catheter tip projects over the lower SVC. No pneumothorax. The patient is rotated. Cardiomegaly is not significantly changed. There is a retrocardiac hiatal hernia. Patchy bilateral lung opacities, slight suprahilar prominence, unchanged from earlier today. No large pleural effusion. IMPRESSION: 1. Tip of the right internal jugular central venous catheter projects over the lower SVC. No pneumothorax. 2. Stable  cardiomegaly. Patchy bilateral lung opacities, slight suprahilar prominence, unchanged from earlier today. Hiatal hernia. Electronically Signed   By: Keith Rake M.D.   On: 02/07/2020 23:16   DG Abd Portable 1V  Result Date: 01/23/2020 CLINICAL DATA:  NG tube placement. EXAM: PORTABLE ABDOMEN - 1 VIEW COMPARISON:  Abdominal CT yesterday. FINDINGS: Tip and side port of the enteric tube below the diaphragm in the stomach. An IVC filter is in place. More detailed evaluation is limited by habitus. IMPRESSION: Tip and side port of the enteric tube below the diaphragm in the stomach. Electronically Signed   By: Keith Rake M.D.   On: 01/29/2020 00:16    Anti-infectives: Anti-infectives (From admission, onward)   Start     Dose/Rate Route Frequency Ordered Stop   02/07/20 2230  clindamycin (CLEOCIN) IVPB 600 mg     600 mg 100 mL/hr over 30 Minutes Intravenous Every 8 hours 02/07/20 2127     02/07/20 1400  metroNIDAZOLE (FLAGYL) tablet 500 mg  Status:  Discontinued     500 mg Oral Every 8 hours 02/07/20 1309 02/07/20 1343   02/07/20 1400  metroNIDAZOLE (FLAGYL) IVPB 500 mg  Status:  Discontinued     500 mg 100 mL/hr over 60 Minutes Intravenous Every 8 hours 02/07/20 1343 02/07/20 2134   02/07/20 1330  ceFEPIme (MAXIPIME) 2 g in sodium chloride 0.9 % 100 mL IVPB     2 g 200 mL/hr over 30 Minutes Intravenous Every 8 hours 02/07/20 1243     02/06/20 2200  vancomycin (VANCOREADY) IVPB 1750 mg/350 mL     1,750 mg 175 mL/hr over 120 Minutes Intravenous Every 24 hours 02/06/20 1034     02/06/20 0153  vancomycin variable dose per unstable renal function (pharmacist dosing)  Status:  Discontinued      Does not apply See admin instructions 02/06/20 0153 02/06/20 1034   02/04/20 2000  piperacillin-tazobactam (ZOSYN) IVPB 3.375 g  Status:  Discontinued     3.375 g 12.5 mL/hr over 240 Minutes Intravenous Every 8 hours 02/04/20 1826 02/07/20 1228   02/04/20 1730  piperacillin-tazobactam (ZOSYN) IVPB  3.375 g  Status:  Discontinued     3.375 g 12.5 mL/hr over 240 Minutes Intravenous Every 8 hours 02/04/20 1617 02/04/20 1826   02/04/20 0800  vancomycin (VANCOREADY) IVPB 2000 mg/400 mL  Status:  Discontinued     2,000 mg 200 mL/hr over 120 Minutes Intravenous Every 12 hours 01/28/2020 1908 02/06/20 0153   01/12/2020 1930  vancomycin (VANCOCIN) 2,500 mg in sodium chloride 0.9 % 500 mL IVPB     2,500 mg 250 mL/hr over 120 Minutes Intravenous  Once 01/19/2020 1858 01/20/2020 2138   01/27/2020 1830  cefTRIAXone (ROCEPHIN) 2 g in  sodium chloride 0.9 % 100 mL IVPB     2 g 200 mL/hr over 30 Minutes Intravenous  Once 01/28/2020 1819 01/23/2020 1956       Assessment/Plan Multiple large wounds on abdominal wall and hip/buttock -all wounds are 100% clean.  There is some mild erythema on her abdominal wall but no need for surgical intervention -left hip wound with 100% eschar.  Would not debride that currently. Would treat with conservative management with santyl and hydrotherapy if she can tolerate to help debride away tight eschar. -no surgical needs for this patient at this time.    LOS: 5 days    Henreitta Cea , [facility] 01/13/2020, 9:04 AM Please see Amion for pager number during day hours 7:00am-4:30pm or 7:00am -11:30am on weekends

## 2020-02-08 NOTE — Progress Notes (Signed)
Pulled ET tube back to 20 per MD order.

## 2020-02-08 NOTE — Transfer of Care (Signed)
Immediate Anesthesia Transfer of Care Note  Patient: Megan Ortiz  Procedure(s) Performed: EXCISIONAL DEBRIDEMENT OF SKIN, SOFT Ortiz, MUSCLE OF NECROTIZING ABDOMINAL WALL INFECTION. (N/A Abdomen)  Patient Location: ICU  Anesthesia Type:General  Level of Consciousness: sedated and Patient remains intubated per anesthesia plan  Airway & Oxygen Therapy: Patient remains intubated per anesthesia plan and Patient placed on Ventilator (see vital sign flow sheet for setting)  Post-op Assessment: Report given to RN and Post -op Vital signs reviewed and stable  Post vital signs: Reviewed and stable  Last Vitals:  Vitals Value Taken Time  BP    Temp    Pulse 119 01/26/2020 2100  Resp 28 01/25/2020 2100  SpO2 100 % 01/18/2020 2100  Vitals shown include unvalidated device data.  Last Pain:  Vitals:   01/15/2020 1130  TempSrc: Axillary  PainSc:       Patients Stated Pain Goal: 4 (17/91/50 5697)  Complications: No apparent anesthesia complications

## 2020-02-08 NOTE — Anesthesia Preprocedure Evaluation (Signed)
Anesthesia Evaluation  Patient identified by MRN, date of birth, ID band Patient unresponsive  General Assessment Comment:Pt is intubated and sedated.  Reviewed: Allergy & Precautions, H&P , NPO status , Patient's Chart, lab work & pertinent test results  Airway Mallampati: Intubated       Dental   Pulmonary asthma , former smoker,    breath sounds clear to auscultation       Cardiovascular hypertension, + DVT   Rhythm:regular Rate:Normal  H/o DVT   Neuro/Psych  Headaches, PSYCHIATRIC DISORDERS Anxiety    GI/Hepatic PUD, GERD  ,  Endo/Other  Morbid obesity  Renal/GU      Musculoskeletal   Abdominal   Peds  Hematology  (+) Blood dyscrasia, anemia ,   Anesthesia Other Findings   Reproductive/Obstetrics                             Anesthesia Physical Anesthesia Plan  ASA: IV and emergent  Anesthesia Plan: General   Post-op Pain Management:    Induction: Intravenous  PONV Risk Score and Plan: 3 and Ondansetron, Midazolam, Dexamethasone and Treatment may vary due to age or medical condition  Airway Management Planned: Oral ETT  Additional Equipment:   Intra-op Plan:   Post-operative Plan: Post-operative intubation/ventilation  Informed Consent: I have reviewed the patients History and Physical, chart, labs and discussed the procedure including the risks, benefits and alternatives for the proposed anesthesia with the patient or authorized representative who has indicated his/her understanding and acceptance.       Plan Discussed with: CRNA, Anesthesiologist and Surgeon  Anesthesia Plan Comments:         Anesthesia Quick Evaluation

## 2020-02-08 NOTE — Procedures (Signed)
Central Venous Catheter Insertion Procedure Note Akirah Storck [id number] 1976/08/15  Procedure: Insertion of Central Venous Catheter Indications: Drug and/or fluid administration and Frequent blood sampling  Procedure Details Consent: Risks of procedure as well as the alternatives and risks of each were explained to the (patient/caregiver).  Consent for procedure obtained. Time Out: Verified patient identification, verified procedure, site/side was marked, verified correct patient position, special equipment/implants available, medications/allergies/relevent history reviewed, required imaging and test results available.  Performed  Maximum sterile technique was used including antiseptics, cap, gloves, gown, hand hygiene, mask and sheet. Skin prep: chloraprep; local anesthetic administered A antimicrobial bonded/coated triple lumen catheter was placed in the right internal jugular vein using the Seldinger technique.  Evaluation Blood flow good Complications: No apparent complications Patient did tolerate procedure well. Chest X-ray ordered to verify placement.  CXR: normal. Wire visualized in vein in 2 views on [place] during placement and vbg confirming venous O2 saturation. Line cleared.   Maryjane Hurter 01/23/2020, 1:39 AM

## 2020-02-08 NOTE — Progress Notes (Signed)
Assisted MD with Intubation.  Pt tolerated well. ABG and CXR tro follow

## 2020-02-08 NOTE — Progress Notes (Signed)
Echocardiogram 2D Echocardiogram has been performed.  Darlina Sicilian M 01/30/2020, 8:01 AM

## 2020-02-08 NOTE — Op Note (Signed)
02/05/2020  9:14 PM  PATIENT:  Megan Ortiz  44 y.o. female  PRE-OPERATIVE DIAGNOSIS:  necrotizing abdominal wall infection of pannus  POST-OPERATIVE DIAGNOSIS:  necrotizing abdominal wall infection of pannus  PROCEDURE:  Procedure(s): EXCISIONAL DEBRIDEMENT OF SKIN, SOFT TISSUE, MUSCLE OF NECROTIZING ABDOMINAL WALL INFECTION/panniculectomy (57cm x 16 cm x15 cm)  SURGEON:  Surgeon(s): Greer Pickerel, MD  PHYSICIAN ASSISTANT: none  ASSISTANTS: none   ANESTHESIA:   general  EBL:  500 mL   BLOOD ADMINISTERED:2u CC PRBC  DRAINS: Nasogastric Tube   LOCAL MEDICATIONS USED:  NONE  SPECIMEN:  Source of Specimen:  cultures of purulent wound &2) skin,soft tissue, muscle  DISPOSITION OF SPECIMEN:  micro and path  COUNTS:  YES  TOURNIQUET:  * No tourniquets in log *  INDICATION: 44 year old female with severe obesity, BMI 83, who was admitted a few days ago with multiple chronic wounds with infection.  She has a chronic wound of her pannus of the left side which were open and various other wounds.  She has a history of calciphylaxis.  She developed a recurrent septic picture and was transferred back to the ICU.  CT scan done showed air in her pannus concerning for necrotizing soft tissue infection.  The open wounds on the left side appeared stable she did have some cellulitis of her abdominal wall to the right of those open wounds.  When I saw her this afternoon she had worsening mentation.  She was essentially obtunded.  She did not really respond when I probed the open wound on the left of her pannus.  When I pressed on her right side of her pannus she grimaced and appeared very uncomfortable.  There was also some mottling of the skin in that location.  Without any other clear source of infection and her physical exam I discussed with the patient's husband my recommendation for going to the operating room for excisional debridement of nonviable skin and soft tissue.  I had a  prolonged conversation with the patient's husband regarding risk and benefits such as bleeding ongoing infection, need for additional procedures, significant wound healing issues, and additional concerns which are separately documented.  Patient was on Eliquis due to history of DVTs.  She had also received IV heparin bolus earlier today.  I have felt she need to go to the OR even though she still had Eliquis on board because of the probable necrotizing soft tissue infection.  PROCEDURE: After obtaining informed consent from the patient's husband she was taken to [facility], OR 2 and placed supine on the operating room table.  Her endotracheal tube was connected to the anesthesia circuit.  Anesthesia had to place a new A-line.  They initially had some trouble ventilating the patient.  We had to place bed extenders on each side of the bed.  She was on therapeutic scheduled antibiotics.  Her lower abdominal wall and pannus were prepped and draped in the usual standard surgical fashion with CHG solution.  As previously mentioned she had 2 open wounds on her left lateral pannus.  The base of those wounds look clean.  In the central portion of her pannus there is an area of mottling of the skin.  A surgical timeout was performed.  The area was aspirated and there was purulent fluid that was obtained.  Then using a scalpel I sharply incised and made an elliptical incision in the central portion of her lower pannus.  There was frank drainage pus.  There was necrotic fat.  This extended laterally toward the right side.  I continued to sharply and serially excise skin and subcutaneous fat which was nonviable.  Muscle was involved as well.  I ended up taking additional superior and inferior skin flaps as well due to finding additional nonviable tissue.  I essentially ended up doing large panniculectomy.  The nonviable muscle was removed.  This was taken all the way down to her fascia.  At times I used a rongeur to also  debride nonviable adipose tissue.  there was some significant oozing from skin edges and collateral vessels near her skin.  These were controlled with 3-0 Vicryl sutures as well as with multiple clips.  Once all the nonviable tissue was excised I went about obtaining hemostasis with a combination of clips, argon coagulation as well as typical coagulation.  I did oversew to the skin edges with a running 3-0 nylon that were refractory to coagulation.  She ended up having a large open pannus wound.  The wound was packed with moistened Kerlix x3 followed by multiple ABDs and tape.  All needle, instrument, and sponge counts were correct x2.  She tolerated suture well.  She was taken directly back to the ICU intubated.  .  Tool used for debridement (curette, scapel, etc.) scalpel, rongeur    Frequency of surgical debridement.   Initial   Area and depth of devitalized tissue removed from wound.  57 x 16 cm wide by 15 cm deep   Blood loss and description of tissue removed.  500 cc, skin, subcutaneous fat, muscle    Evidence of the progress of the wound's response to treatment.  A.  Current wound volume (current dimensions and depth).  57cm x 16 x 15 cm  B.  Presence (and extent of) of infection.  Skin, subcutaneous fat, muscle of the abdominal wall  C.  Presence (and extent of) of non viable tissue.  Necrotic fat, calciphylaxis, nonviable muscle down to fascia  D.  Other material in the wound that is expected to inhibit healing.  Calciphylaxis    Was there any viable tissue removed (measurements): Small amount of skin-probably about a total of 3 cm x 5 cm   DICTATION: .Dragon Dictation  PLAN OF CARE: already inpatient  PATIENT DISPOSITION:  ICU - intubated and hemodynamically stable.   Delay start of Pharmacological VTE agent (>24hrs) due to surgical blood loss or risk of bleeding: YES!

## 2020-02-08 NOTE — Procedures (Addendum)
Intubation Procedure Note Megan Ortiz [id number] 1976/07/06  Procedure: Intubation Indications: Airway protection and maintenance  Procedure Details Consent: Obtained from husband over the phone.  Time Out: Verified patient identification, verified procedure, site/side was marked, verified correct patient position, special equipment/implants available, medications/allergies/relevent history reviewed, required imaging and test results available.  Performed  Glidescope MAC and 4 Drugs used: 100 mcg fentanyl, 20 mg, 100 mcg rocuronium There was dessicated mucus in the oropharynx that required suctioning.  This was a difficult airway due to redundancy of soft tissue in her oropharynx despite good sniffing position and adjuvant cricoid pressure. I was able to obtain a Grade 2 view. ETT placed on first attempt. ETT visualized entering the vocal cords on video and confirmed with EtCO2 and bilateral breath sounds.  Evaluation Hemodynamic Status: BP stable throughout; O2 sats: transiently fell during during procedure and quickly recovered. Patient's Current Condition: stable Complications: No apparent complications Patient did tolerate procedure well. Chest X-ray ordered to verify placement.  CXR: pending.   Spero Geralds 01/31/2020

## 2020-02-08 NOTE — Progress Notes (Signed)
NAME:  Megan Ortiz, MRN:  [id number], DOB:  17-Feb-1976, LOS: 5 ADMISSION DATE:  02/02/2020, CONSULTATION DATE:  02/04/20 REFERRING MD:  Forestine [facility], CHIEF COMPLAINT:  weakness  Brief History   Patient with history of calciphylaxis presented to [facility] emergency room with complaints of general malaise, treated for sepsis of unk source.   History of present illness   44 year old morbidly obese female with history of calciphylaxis diagnosed several months ago at [facility], hx DVT/PE, chronic anemia, presents with weakness and general malaise.  On presentation patient was found to have a leukocytosis of 50,000 with a hemoglobin of 5.6, platelet count of 397, lactic acid 8.0.  Patient's creatinine is 0.85 calcium of 8.8 with a phosphorus of 5.3. Relative hypotension  In summary: Hypotension, WBC, anemia, lactate.   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 Transferred to ICU  Surgery consulted.  Consults:  Wound care  Procedures:  NA  Significant Diagnostic Tests:  CT abd/pelv:  IMPRESSION: 1. Significant portions of the study are nearly nondiagnostic secondary to lack of IV contrast and the patient's body habitus. 2. There are pockets of subcutaneous gas with surrounding edema and overlying skin thickening involving the patient's pannus. These findings are concerning for a necrotizing infectious process in the appropriate clinical setting. There is no well-formed drainable fluid collection. 3. Massive hepatomegaly with hepatic steatosis. 4. Diffuse bilateral ground-glass airspace opacities concerning for an atypical infectious process. 5. Large hiatal hernia. 6. Borderline  splenomegaly. 7. Cardiomegaly with a small to moderate-sized pericardial effusion. 8. IVC filter in place with significant caval penetration.   Micro Data:  Blood 3/23 >>  Urine 3/24 >>  Blood cx 3/27  Antimicrobials:  Zosyn 3/23  >>3/26 Vancomycin 3/23 >>   Cefepime 3/27--> Flagyl 3/27 --> Clindamycin 3/27 -->     Interim history/subjective:   Still tachycardic overnight  Objective   Blood pressure 115/65, pulse (!) 126, temperature 98.9 F (37.2 C), temperature source Oral, resp. rate (!) 23, height 5\' 5"  (1.651 m), weight (!) 226.8 kg, SpO2 98 %.        Intake/Output Summary (Last 24 hours) at 01/20/2020 4801 Last data filed at 01/16/2020 0600 Gross per 24 hour  Intake 2796.15 ml  Output 850 ml  Net 1946.15 ml   Filed Weights   02/02/2020 1649  Weight: (!) 226.8 kg    Examination: General: Morbidly obese woman, laying comfortably in a bariatric bed, intermittenly drowsy HENT: Oropharynx clear, edentulous Lungs: Distant but clear, decreased at both bases Cardiovascular: Regular, tachycardic 130,no murmur Abdomen: Nondistended, obese, hypoactive bowel sounds Extremities: non pitting edema Skin: Decubitus wounds right buttock, left upper thigh, right hip, left buttock/hip unstageable with eschar.  Odor noted, drainage, no surrounding erythema Neuro: lethargic but arousable when stimulated.  L pupil slightly larger than R.  Both reactive.   Resolved Hospital Problem list   NA  Assessment & Plan:  Fever, hypotension, tachycardia: altered mental status.  All appear likely secondary to worsening sepsis.   Leukocytosis worsening today.  Given limited IV fluids given worsening pulmonary edema.  Wounds appear stable on the surface.  Surgery consulted for possible wound infection based on CT Broadened abtx 3/27, started clinda overnight.  Repeat blood cultures pending BNP elevated  200s Pericardial effusion. Formal echo pending.   Appears small on my exam but very  poor windows.  Does not appear to be contributing to hypotension. Hx of effusion?  Dx?  Procal 14.97 on 3/24  Hypoglycemia:  Starting D 10 infusion, q 1 hr BS until stable.   Altered mental status: Likely 2/2 worsening sepsis.   .  CT head to r/o bleed or stroke (on anticoagulation).  No morphine given overnight  Hypoxemic resp failure: worsening edema, atelectasis.  Possible PNA but no cough, no sputum production.   Hepatic steatosis, massive hepatomegaly Ammonia 51 AP slightly elevated nl AST/ALT Bili.   Calciphylaxis with the wounds as described above: Most likely source of fever, sepsis Wound care consulting.   Dakin's moistened gauze into an overall wounds except the left buttock and bilateral lower extremities.  Areas with eschar twice daily application Silvadene and ABD/gauze Bariatric bed  Protein cal malnutrition; Alb 1 Contributing to anasarca/edema.   Chronic pain secondary to calciphylaxis:  Pain control ordered > tylenol, tramadol, oxycodone -all held today.  Morphine prn. - on hold  Microcytic anemia, anemia possible contributor to her relative hypotension at presentation: PRBC given, following hemoglobin, goal 7.0 No clear evidence of acute blood loss, no evidence of bleeding from wounds  Severe weakness and fatigue.  Improving.  Suspect related to sepsis and severe anemia. Supportive care as outlined above  Hx DVT and PE, with IVC filter Consider holding for possible procedures.  Hold Eliquis, start heparin today IF she is not going to surgery.   Nausea Continue home regimen but add IV Zofran on 3/25  Best practice:  Diet: npo for now except sips as tolerates Pain/Anxiety/Delirium protocol (if indicated): As needed VAP protocol (if indicated): N/A DVT prophylaxis: Eliquis GI prophylaxis: N/A Glucose control: We will monitor Mobility: Bedrest Code Status: Full Family Communication: Updated patient and husband in full 3/25 Disposition: ICU  Labs     CBC: Recent Labs  Lab 01/21/2020 1756 02/04/20 0552 02/04/20 1921 02/04/20 1921 02/05/20 0252 02/05/20 0252 02/06/20 0322 02/07/20 0228 02/07/20 2314 01/24/2020 0348 01/13/2020 0449  WBC 50.1*   < > 32.6*  --  26.6*  --  29.0* 37.9*  --  40.9*  --   NEUTROABS 43.7*  --   --   --   --   --   --  32.8*  --   --   --   HGB 5.6*   < > 8.0*   < > 7.8*   < > 8.3* 8.0* 8.5* 7.3* 8.2*  HCT 20.6*   < > 27.1*   < > 26.5*   < > 28.4* 27.2* 25.0* 25.3* 24.0*  MCV 99.0   < > 94.4  --  93.0  --  94.4 94.4  --  95.1  --   PLT 397   < > 316  --  314  --  357 428*  --  472*  --    < > = values in this interval not displayed.    Basic Metabolic Panel: Recent Labs  Lab 02/04/20 1921 02/04/20 1921 02/05/20 0252 02/05/20 0252 02/05/20 2348 02/05/20 2348 02/06/20 0322 02/07/20 0228 02/07/20 1915 02/07/20 2314 02/01/2020 0348 02/07/2020 0449  NA 130*   < > 131*   < > 131*   < > 130* 131* 131* 132*  --  130*  K 4.7   < > 4.1   < > 3.6   < > 3.6 4.0 4.1 4.1  --  4.1  CL 99   < > 99  --  98  --  98 98 99  --   --   --   CO2 16*   < > 19*  --  19*  --  19* 20* 18*  --   --   --   GLUCOSE 108*   < > 73  --  108*  --  103* 86 83  --   --   --   BUN 9   < > 7  --  11  --  11 18 25*  --   --   --   CREATININE 0.95   < > 0.84  --  0.94  --  0.89 0.78 1.13*  --   --   --   CALCIUM 7.9*   < > 8.4*  --  8.5*  --  8.4* 8.8* 8.6*  --   --   --   MG 1.6*  --  1.6*  --  2.2  --  2.1  --   --   --  2.2  --   PHOS 4.0  --  4.2  --   --   --   --   --   --   --  4.7*  --    < > = values in this interval not displayed.   GFR: Estimated Creatinine Clearance: 125.3 mL/min (A) (by C-G formula based on SCr of 1.13 mg/dL (H)). Recent Labs  Lab 02/04/20 1921 02/04/20 1921 02/05/20 0252 02/05/20 0800 02/05/20 1000 02/06/20 0322 02/07/20 0228 02/07/20 0903 02/07/20 1147 02/05/2020 0348  PROCALCITON 14.97  --   --   --   --   --   --   --   --   --   WBC 32.6*   < > 26.6*  --   --  29.0* 37.9*  --   --  40.9*   LATICACIDVEN 6.0*   < >  --  2.9* 4.5*  --   --  3.9* 3.6*  --    < > = values in this interval not displayed.    Liver Function Tests: Recent Labs  Lab 02/06/2020 1756 02/04/20 1921 02/07/20 1915  AST 25 21 40  ALT 13 13 24   ALKPHOS 200* 191* 168*  BILITOT 0.3 0.8 1.0  PROT 6.4* 5.7* 6.3*  ALBUMIN 1.3* <1.0* 1.0*   No results for input(s): LIPASE, AMYLASE in the last 168 hours. Recent Labs  Lab 02/07/20 1915  AMMONIA 51*    ABG    Component Value Date/Time   PHART 7.416 01/29/2020 0449   PCO2ART 26.1 (L) 01/13/2020 0449   PO2ART 59.0 (L) 02/01/2020 0449   HCO3 16.7 (L) 01/12/2020 0449   TCO2 18 (L) 01/30/2020 0449   ACIDBASEDEF 7.0 (H) 02/07/2020 0449   O2SAT 91.0 02/02/2020 0449     Coagulation Profile: Recent Labs  Lab 02/01/2020 1839 02/04/20 1921  INR 1.4* 1.4*    Cardiac Enzymes: No results for input(s): CKTOTAL, CKMB, CKMBINDEX, TROPONINI in the last 168 hours.  HbA1C: Hgb A1c MFr Bld  Date/Time Value Ref Range Status  02/04/2020 07:21 PM 5.1 4.8 - 5.6 % Final    Comment:    (NOTE) Pre diabetes:          5.7%-6.4% Diabetes:              >6.4% Glycemic control for   <7.0% adults with diabetes   02/26/2017 04:52 AM 5.4 4.8 -  5.6 % Final    Comment:    (NOTE)         Pre-diabetes: 5.7 - 6.4         Diabetes: >6.4         Glycemic control for adults with diabetes: <7.0     CBG: Recent Labs  Lab 02/07/20 1516 02/07/20 1621 02/07/20 1835 02/07/20 2249 02/06/2020 0348  GLUCAP 71 69* 78 84 70     Critical care time: 33 minutes     Collier Bullock MD PCCM

## 2020-02-08 NOTE — Plan of Care (Addendum)
PCCM Plan of Care Note  Surgery consulted given cellulitis with sq gas on CT A/P (unclear to what extent this may represent progressive necrotizing infection vs related to prior sinus tract development from calciphylaxis. No purulence on my exam including in packed areas of abdomen), they will follow and clindamycin added in favor of flagyl. Central line placed for poor IV access.   Franchot Heidelberg PGY-5 Pulmonary/Critical Care

## 2020-02-08 NOTE — Progress Notes (Signed)
CRITICAL VALUE ALERT  Critical Value:  WBC 54.7  Date & Time Notied:  02/07/2020 @ 2212  Provider Notified: Warren Lacy  Orders Received/Actions taken: TBD

## 2020-02-08 NOTE — Progress Notes (Signed)
ANTICOAGULATION CONSULT NOTE - Initial Consult  Pharmacy Consult for Heparin Indication: History of PE and DVT   Allergies  Allergen Reactions  . Aspirin     Due to hx of stomach ulcers  . Coumarin     Patient Measurements: Height: 5\' 5"  (165.1 cm) Weight: (!) 500 lb (226.8 kg) IBW/kg (Calculated) : 57 Heparin Dosing Weight: 118kg  Vital Signs: Temp: 100.1 F (37.8 C) (03/28 0748) Temp Source: Axillary (03/28 0748) BP: 103/56 (03/28 0930) Pulse Rate: 124 (03/28 0930)  Labs: Recent Labs    02/06/20 0322 02/06/20 0322 02/07/20 0228 02/07/20 0228 02/07/20 1915 02/07/20 2314 02/07/20 2314 02/05/2020 0348 01/15/2020 0449 01/26/2020 0802  HGB 8.3*   < > 8.0*   < >  --  8.5*   < > 7.3* 8.2*  --   HCT 28.4*   < > 27.2*   < >  --  25.0*  --  25.3* 24.0*  --   PLT 357  --  428*  --   --   --   --  472*  --   --   LABPROT  --   --   --   --   --   --   --   --   --  19.2*  INR  --   --   --   --   --   --   --   --   --  1.6*  CREATININE 0.89   < > 0.78  --  1.13*  --   --   --   --  1.09*   < > = values in this interval not displayed.    Estimated Creatinine Clearance: 129.9 mL/min (A) (by C-G formula based on SCr of 1.09 mg/dL (H)).   Medical History: Past Medical History:  Diagnosis Date  . Anemia   . Anemia   . Anxiety   . Asthma   . DVT (deep venous thrombosis) ([place])   . Gastric ulcer   . Headache   . Hiatal hernia   . Hypertension   . Necrotizing fasciitis ([place])   . PE (pulmonary embolism)   . Tachycardia     Medications:  Medications Prior to Admission  Medication Sig Dispense Refill Last Dose  . acetaminophen (TYLENOL) 500 MG tablet Take 500 mg by mouth every 6 (six) hours as needed for moderate pain.   02/02/2020 at pmt  . albuterol (VENTOLIN HFA) 108 (90 BASE) MCG/ACT inhaler Inhale 2 puffs into the lungs every 6 (six) hours as needed for wheezing.   02/02/2020 at Unknown time  . ELIQUIS 5 MG TABS tablet Take 5 mg by mouth 2 (two) times daily.    01/28/2020 at 0800  . gabapentin (NEURONTIN) 800 MG tablet Take 800 mg by mouth 3 (three) times daily.   02/02/2020 at Unknown time  . montelukast (SINGULAIR) 10 MG tablet Take 10 mg by mouth daily.   02/02/2020 at Unknown time  . nortriptyline (PAMELOR) 25 MG capsule Take 25 mg by mouth at bedtime.   02/02/2020 at Unknown time  . ondansetron (ZOFRAN) 4 MG tablet Take 1 tablet (4 mg total) by mouth every 8 (eight) hours as needed for nausea or vomiting. 20 tablet 0 02/02/2020 at Unknown time  . oxyCODONE (OXY IR/ROXICODONE) 5 MG immediate release tablet Take 5 mg by mouth every 6 (six) hours as needed.   02/02/2020 at Unknown time  . sertraline (ZOLOFT) 100 MG tablet Take 1 tablet (100 mg total)  by mouth 2 (two) times daily. 60 tablet 2 02/02/2020 at Unknown time  . topiramate (TOPAMAX) 50 MG tablet Take 50 mg by mouth daily.  11 02/02/2020 at Unknown time  . traMADol (ULTRAM) 50 MG tablet Take 50 mg by mouth 3 (three) times daily as needed.   02/02/2020 at Unknown time  . pantoprazole (PROTONIX) 40 MG tablet Take 1 tablet (40 mg total) by mouth 2 (two) times daily before a meal. 60 tablet 1     Assessment: Patient with history of DVT/PE with IVC filter placement currently admitted with general malaise, presenting septic with leukocytosis. Her apixaban is being held with general surgery consulting for potential surgical intervention with her wounds. Currently not going to the OR so will start heparin.   In July 2020 patient was on a heparin drip and required 2650 units/hr to be therapeutic. I will not start at this dose but will bolus and start at the upper end of the recommended range.  Goal of Therapy:  Heparin level 0.3-0.7 units/ml Monitor platelets by anticoagulation protocol: Yes   Plan:  Heparin 6000 unit bolus x1 Heparin 2200 unuts/hr HL 1700 Daily HL and CBC  Megan Ortiz 01/22/2020,10:14 AM

## 2020-02-08 NOTE — Progress Notes (Addendum)
Initial Nutrition Assessment  RD working remotely.  DOCUMENTATION CODES:   Morbid obesity  INTERVENTION:   -If aggressive care is desired, pt may benefit from short term enteral nutrition support via small breo feeding tube (ec cortrak). Cortrak service available on Mondays, Wednesdays, and Fridays on the [facility] from 8 AM to 4 PM -If pt remains NPO, recommend:  Initiate Vital 1.5 @ 55 ml/hr    60 ml Prostat BID.    1 packet Juven BID, each packet provides 95 calories, 2.5 grams of protein (collagen), and 9.8 grams of carbohydrate (3 grams sugar); also contains 7 grams of L-arginine and L-glutamine, 300 mg vitamin C, 15 mg vitamin E, 1.2 mcg vitamin B-12, 9.5 mg zinc, 200 mg calcium, and 1.5 g  Calcium Beta-hydroxy-Beta-methylbutyrate to support wound healing  Tube feeding regimen provides 2570 kcal (100% of needs), 154 grams of protein, and 1008 ml of H2O.   NUTRITION DIAGNOSIS:   Increased nutrient needs related to wound healing as evidenced by estimated needs.  GOAL:   Patient will meet greater than or equal to 90% of their needs  MONITOR:   Diet advancement, Labs, Weight trends, Skin, I & O's  REASON FOR ASSESSMENT:   Consult Assessment of nutrition requirement/status  ASSESSMENT:   44 year old morbidly obese female with history of calciphylaxis diagnosed several months ago at [facility], hx DVT/PE, chronic anemia, presents with weakness and general malaise.  On presentation patient was found to have a leukocytosis of 50,000 with a hemoglobin of 5.6, platelet count of 397, lactic acid 8.0.  Patient's creatinine is 0.85 calcium of 8.8 with a phosphorus of 5.3. Relative hypotension  Pt admitted with calciphylaxis.   3/27- transferred to ICU to [place] and inability to take PO medications 3/28- NGT inserted and removed  Reviewed I/O's: +1.9 L x 24 hours and +5 L since admission  UOP: 850 ml x 24 hours  Pt unable to provide history at this time.   Per [facility]  note from 02/05/20, pt with calciphylaxis on multiple wounds followed by [facility] (all wounds to right buttock, left upper thigh, right hip with exposed fat, full thickness, heavy odor, heavy drainage, primarily pink and slick)  Per general surgery notes, pt may require surgical debridement and requesting to hold TF at this time. Pt with no feeding access at this time.   Noted wt gain trend; suspect related to deep pitting edema. Edema likely masking true weight loss as well as potential fat and muscle depletion.   Medications reviewed and include thiamine, dextrose 10% infusion @ 50 ml/hr, vasopressin, and norepinephrine.   Lab Results  Component Value Date   HGBA1C 5.1 02/04/2020   PTA DM medications are none.   Labs reviewed: Na: 130, Phos: 4.2, Mg and K WDL, CBGS: 70 (inpatient orders for glycemic control are 0-5 units insulin aspart q HS, 0-9 units insulin aspart TID with meals).   Diet Order:   Diet Order            Diet NPO time specified Except for: Sips with Meds  Diet effective now              EDUCATION NEEDS:   No education needs have been identified at this time  Skin:  Skin Assessment: Skin Integrity Issues: Skin Integrity Issues:: Other (Comment) Other: calciphylaxis (rt elbow and bilateral chest); rt wrist bruise 2/2 IV filtration; scabbing lt lower leg; scars on rt leg; non-pressure wounds (rt lateral hip, lt hip,  and rt upper leg)  Last BM:  02/05/20  Height:   Ht Readings from Last 1 Encounters:  01/24/2020 5\' 5"  (1.651 m)    Weight:   Wt Readings from Last 1 Encounters:  01/22/2020 (!) 226.8 kg    Ideal Body Weight:  56.8 kg  BMI:  Body mass index is 83.2 kg/m.  Estimated Nutritional Needs:   Kcal:  6010-9323  Protein:  > 140 grams  Fluid:  1000 ml + UOP    Loistine Chance, RD, LDN, [place] Registered Dietitian II Certified Diabetes Care and Education Specialist Please refer to [facility] for RD and/or RD on-call/weekend/after  hours pager

## 2020-02-08 NOTE — Procedures (Signed)
Arterial Catheter Insertion Procedure Note Kriti Katayama [id number] [birth year]  Procedure: Insertion of Arterial Catheter  Indications: Blood pressure monitoring  Procedure Details Consent: Unable to obtain consent because of altered level of consciousness. Time Out: Verified patient identification, verified procedure, site/side was marked, verified correct patient position, special equipment/implants available, medications/allergies/relevent history reviewed, required imaging and test results available.  Performed  Maximum sterile technique was used including antiseptics, gloves, gown, hand hygiene, mask and sheet. Skin prep: Chlorhexidine; local anesthetic administered 20 gauge catheter was inserted into right radial artery using the Seldinger technique. ULTRASOUND GUIDANCE USED: NO Evaluation Blood flow good; BP tracing good. Complications: No apparent complications.   Gonzella Lex 02/07/2020

## 2020-02-09 DIAGNOSIS — I82401 Acute embolism and thrombosis of unspecified deep veins of right lower extremity: Secondary | ICD-10-CM | POA: Diagnosis not present

## 2020-02-09 DIAGNOSIS — A419 Sepsis, unspecified organism: Secondary | ICD-10-CM | POA: Diagnosis not present

## 2020-02-09 LAB — POCT I-STAT 7, (LYTES, BLD GAS, ICA,H+H)
Acid-base deficit: 7 mmol/L — ABNORMAL HIGH (ref 0.0–2.0)
Acid-base deficit: 7 mmol/L — ABNORMAL HIGH (ref 0.0–2.0)
Acid-base deficit: 8 mmol/L — ABNORMAL HIGH (ref 0.0–2.0)
Acid-base deficit: 8 mmol/L — ABNORMAL HIGH (ref 0.0–2.0)
Bicarbonate: 17.9 mmol/L — ABNORMAL LOW (ref 20.0–28.0)
Bicarbonate: 18.2 mmol/L — ABNORMAL LOW (ref 20.0–28.0)
Bicarbonate: 20 mmol/L (ref 20.0–28.0)
Bicarbonate: 22.2 mmol/L (ref 20.0–28.0)
Calcium, Ion: 1.04 mmol/L — ABNORMAL LOW (ref 1.15–1.40)
Calcium, Ion: 1.09 mmol/L — ABNORMAL LOW (ref 1.15–1.40)
Calcium, Ion: 1.17 mmol/L (ref 1.15–1.40)
Calcium, Ion: 1.25 mmol/L (ref 1.15–1.40)
HCT: 24 % — ABNORMAL LOW (ref 36.0–46.0)
HCT: 26 % — ABNORMAL LOW (ref 36.0–46.0)
HCT: 27 % — ABNORMAL LOW (ref 36.0–46.0)
HCT: 32 % — ABNORMAL LOW (ref 36.0–46.0)
Hemoglobin: 10.9 g/dL — ABNORMAL LOW (ref 12.0–15.0)
Hemoglobin: 8.2 g/dL — ABNORMAL LOW (ref 12.0–15.0)
Hemoglobin: 8.8 g/dL — ABNORMAL LOW (ref 12.0–15.0)
Hemoglobin: 9.2 g/dL — ABNORMAL LOW (ref 12.0–15.0)
O2 Saturation: 100 %
O2 Saturation: 95 %
O2 Saturation: 95 %
O2 Saturation: 96 %
Patient temperature: 100.8
Patient temperature: 102.6
Patient temperature: 99.5
Potassium: 3.8 mmol/L (ref 3.5–5.1)
Potassium: 4 mmol/L (ref 3.5–5.1)
Potassium: 4 mmol/L (ref 3.5–5.1)
Potassium: 4.3 mmol/L (ref 3.5–5.1)
Sodium: 132 mmol/L — ABNORMAL LOW (ref 135–145)
Sodium: 132 mmol/L — ABNORMAL LOW (ref 135–145)
Sodium: 133 mmol/L — ABNORMAL LOW (ref 135–145)
Sodium: 133 mmol/L — ABNORMAL LOW (ref 135–145)
TCO2: 19 mmol/L — ABNORMAL LOW (ref 22–32)
TCO2: 19 mmol/L — ABNORMAL LOW (ref 22–32)
TCO2: 21 mmol/L — ABNORMAL LOW (ref 22–32)
TCO2: 24 mmol/L (ref 22–32)
pCO2 arterial: 38.4 mmHg (ref 32.0–48.0)
pCO2 arterial: 39.9 mmHg (ref 32.0–48.0)
pCO2 arterial: 48.5 mmHg — ABNORMAL HIGH (ref 32.0–48.0)
pCO2 arterial: 71.8 mmHg (ref 32.0–48.0)
pH, Arterial: 7.098 — CL (ref 7.350–7.450)
pH, Arterial: 7.226 — ABNORMAL LOW (ref 7.350–7.450)
pH, Arterial: 7.272 — ABNORMAL LOW (ref 7.350–7.450)
pH, Arterial: 7.289 — ABNORMAL LOW (ref 7.350–7.450)
pO2, Arterial: 291 mmHg — ABNORMAL HIGH (ref 83.0–108.0)
pO2, Arterial: 88 mmHg (ref 83.0–108.0)
pO2, Arterial: 92 mmHg (ref 83.0–108.0)
pO2, Arterial: 99 mmHg (ref 83.0–108.0)

## 2020-02-09 LAB — BASIC METABOLIC PANEL
Anion gap: 12 (ref 5–15)
BUN: 32 mg/dL — ABNORMAL HIGH (ref 6–20)
CO2: 19 mmol/L — ABNORMAL LOW (ref 22–32)
Calcium: 7.4 mg/dL — ABNORMAL LOW (ref 8.9–10.3)
Chloride: 101 mmol/L (ref 98–111)
Creatinine, Ser: 1.29 mg/dL — ABNORMAL HIGH (ref 0.44–1.00)
GFR calc Af Amer: 58 mL/min — ABNORMAL LOW (ref >60)
GFR calc non Af Amer: 50 mL/min — ABNORMAL LOW (ref >60)
Glucose, Bld: 141 mg/dL — ABNORMAL HIGH (ref 70–99)
Potassium: 4.1 mmol/L (ref 3.5–5.1)
Sodium: 132 mmol/L — ABNORMAL LOW (ref 135–145)

## 2020-02-09 LAB — CBC WITH DIFFERENTIAL/PLATELET
Abs Immature Granulocytes: 9.85 10*3/uL — ABNORMAL HIGH (ref 0.00–0.07)
Abs Immature Granulocytes: 10.54 10*3/uL — ABNORMAL HIGH (ref 0.00–0.07)
Basophils Absolute: 0.1 10*3/uL (ref 0.0–0.1)
Basophils Absolute: 0 10*3/uL (ref 0.0–0.1)
Basophils Relative: 0 %
Basophils Relative: 0 %
Eosinophils Absolute: 0.3 10*3/uL (ref 0.0–0.5)
Eosinophils Absolute: 0 10*3/uL (ref 0.0–0.5)
Eosinophils Relative: 1 %
Eosinophils Relative: 0 %
HCT: 23.9 % — ABNORMAL LOW (ref 36.0–46.0)
HCT: 25.7 % — ABNORMAL LOW (ref 36.0–46.0)
Hemoglobin: 7 g/dL — ABNORMAL LOW (ref 12.0–15.0)
Hemoglobin: 7.7 g/dL — ABNORMAL LOW (ref 12.0–15.0)
Immature Granulocytes: 18 %
Immature Granulocytes: 21 %
Lymphocytes Relative: 5 %
Lymphocytes Relative: 7 %
Lymphs Abs: 2.9 10*3/uL (ref 0.7–4.0)
Lymphs Abs: 3.5 10*3/uL (ref 0.7–4.0)
MCH: 28.7 pg (ref 26.0–34.0)
MCH: 28.8 pg (ref 26.0–34.0)
MCHC: 29.3 g/dL — ABNORMAL LOW (ref 30.0–36.0)
MCHC: 30 g/dL (ref 30.0–36.0)
MCV: 98 fL (ref 80.0–100.0)
MCV: 96.3 fL (ref 80.0–100.0)
Monocytes Absolute: 2 10*3/uL — ABNORMAL HIGH (ref 0.1–1.0)
Monocytes Absolute: 1.5 10*3/uL — ABNORMAL HIGH (ref 0.1–1.0)
Monocytes Relative: 4 %
Monocytes Relative: 3 %
Neutro Abs: 39.7 10*3/uL — ABNORMAL HIGH (ref 1.7–7.7)
Neutro Abs: 34 10*3/uL — ABNORMAL HIGH (ref 1.7–7.7)
Neutrophils Relative %: 72 %
Neutrophils Relative %: 69 %
Platelets: 458 10*3/uL — ABNORMAL HIGH (ref 150–400)
Platelets: 456 10*3/uL — ABNORMAL HIGH (ref 150–400)
RBC: 2.44 MIL/uL — ABNORMAL LOW (ref 3.87–5.11)
RBC: 2.67 MIL/uL — ABNORMAL LOW (ref 3.87–5.11)
RDW: 19.4 % — ABNORMAL HIGH (ref 11.5–15.5)
RDW: 19 % — ABNORMAL HIGH (ref 11.5–15.5)
Smear Review: INCREASED
WBC: 54.7 10*3/uL (ref 4.0–10.5)
WBC: 49.5 10*3/uL — ABNORMAL HIGH (ref 4.0–10.5)
nRBC: 0.8 % — ABNORMAL HIGH (ref 0.0–0.2)
nRBC: 1.2 % — ABNORMAL HIGH (ref 0.0–0.2)

## 2020-02-09 LAB — PHOSPHORUS
Phosphorus: 4.7 mg/dL — ABNORMAL HIGH (ref 2.5–4.6)
Phosphorus: 4.8 mg/dL — ABNORMAL HIGH (ref 2.5–4.6)

## 2020-02-09 LAB — GLUCOSE, CAPILLARY
Glucose-Capillary: 128 mg/dL — ABNORMAL HIGH (ref 70–99)
Glucose-Capillary: 111 mg/dL — ABNORMAL HIGH (ref 70–99)
Glucose-Capillary: 97 mg/dL (ref 70–99)
Glucose-Capillary: 86 mg/dL (ref 70–99)
Glucose-Capillary: 94 mg/dL (ref 70–99)
Glucose-Capillary: 126 mg/dL — ABNORMAL HIGH (ref 70–99)
Glucose-Capillary: 135 mg/dL — ABNORMAL HIGH (ref 70–99)

## 2020-02-09 LAB — VITAMIN D 25 HYDROXY (VIT D DEFICIENCY, FRACTURES): Vit D, 25-Hydroxy: 10.52 ng/mL — ABNORMAL LOW (ref 30–100)

## 2020-02-09 LAB — CBC
HCT: 23.3 % — ABNORMAL LOW (ref 36.0–46.0)
HCT: 25.5 % — ABNORMAL LOW (ref 36.0–46.0)
Hemoglobin: 6.9 g/dL — CL (ref 12.0–15.0)
Hemoglobin: 7.6 g/dL — ABNORMAL LOW (ref 12.0–15.0)
MCH: 28.9 pg (ref 26.0–34.0)
MCH: 28.9 pg (ref 26.0–34.0)
MCHC: 29.6 g/dL — ABNORMAL LOW (ref 30.0–36.0)
MCHC: 29.8 g/dL — ABNORMAL LOW (ref 30.0–36.0)
MCV: 97.5 fL (ref 80.0–100.0)
MCV: 97 fL (ref 80.0–100.0)
Platelets: 481 10*3/uL — ABNORMAL HIGH (ref 150–400)
Platelets: 453 10*3/uL — ABNORMAL HIGH (ref 150–400)
RBC: 2.39 MIL/uL — ABNORMAL LOW (ref 3.87–5.11)
RBC: 2.63 MIL/uL — ABNORMAL LOW (ref 3.87–5.11)
RDW: 19.9 % — ABNORMAL HIGH (ref 11.5–15.5)
RDW: 19.3 % — ABNORMAL HIGH (ref 11.5–15.5)
WBC: 64 10*3/uL (ref 4.0–10.5)
WBC: 45 10*3/uL — ABNORMAL HIGH (ref 4.0–10.5)
nRBC: 1.2 % — ABNORMAL HIGH (ref 0.0–0.2)
nRBC: 1.1 % — ABNORMAL HIGH (ref 0.0–0.2)

## 2020-02-09 LAB — URINE CULTURE: Culture: NO GROWTH

## 2020-02-09 LAB — VITAMIN B12: Vitamin B-12: 4515 pg/mL — ABNORMAL HIGH (ref 180–914)

## 2020-02-09 LAB — PATHOLOGIST SMEAR REVIEW

## 2020-02-09 LAB — MAGNESIUM
Magnesium: 2.1 mg/dL (ref 1.7–2.4)
Magnesium: 2.1 mg/dL (ref 1.7–2.4)

## 2020-02-09 LAB — TRIGLYCERIDES: Triglycerides: 195 mg/dL — ABNORMAL HIGH (ref <150)

## 2020-02-09 LAB — LACTIC ACID, PLASMA
Lactic Acid, Venous: 2.6 mmol/L (ref 0.5–1.9)
Lactic Acid, Venous: 2.5 mmol/L (ref 0.5–1.9)

## 2020-02-09 LAB — PREPARE RBC (CROSSMATCH)

## 2020-02-09 MED ORDER — ACETAMINOPHEN 325 MG PO TABS
650.0000 mg | ORAL_TABLET | ORAL | Status: DC | PRN
  Administered 2020-02-09 – 2020-02-11 (×5): 650 mg
  Filled 2020-02-09 (×5): qty 2

## 2020-02-09 MED ORDER — SODIUM BICARBONATE-DEXTROSE 150-5 MEQ/L-% IV SOLN
150.0000 meq | INTRAVENOUS | Status: DC
  Filled 2020-02-09: qty 1000

## 2020-02-09 MED ORDER — PIVOT 1.5 CAL PO LIQD
1000.0000 mL | ORAL | Status: DC
  Administered 2020-02-09 – 2020-02-11 (×2): 1000 mL
  Filled 2020-02-09 (×5): qty 1000

## 2020-02-09 MED ORDER — HEPARIN (PORCINE) 25000 UT/250ML-% IV SOLN
2000.0000 [IU]/h | INTRAVENOUS | Status: AC
  Administered 2020-02-09: 2000 [IU]/h via INTRAVENOUS
  Filled 2020-02-09: qty 250

## 2020-02-09 MED ORDER — LACTATED RINGERS IV BOLUS
1000.0000 mL | Freq: Once | INTRAVENOUS | Status: AC
  Administered 2020-02-09: 1000 mL via INTRAVENOUS

## 2020-02-09 MED ORDER — GABAPENTIN 400 MG PO CAPS
800.0000 mg | ORAL_CAPSULE | Freq: Three times a day (TID) | ORAL | Status: DC
  Administered 2020-02-09 – 2020-02-11 (×7): 800 mg
  Filled 2020-02-09 (×9): qty 2

## 2020-02-09 MED ORDER — OXYCODONE HCL 5 MG PO TABS: 5.0000 mg | ORAL_TABLET | Freq: Four times a day (QID) | ORAL | Status: DC | PRN

## 2020-02-09 MED ORDER — SODIUM BICARBONATE 8.4 % IV SOLN
INTRAVENOUS | Status: DC
  Filled 2020-02-09: qty 100
  Filled 2020-02-09 (×8): qty 150

## 2020-02-09 MED ORDER — SERTRALINE HCL 50 MG PO TABS
100.0000 mg | ORAL_TABLET | Freq: Two times a day (BID) | ORAL | Status: DC
  Administered 2020-02-09 – 2020-02-10 (×3): 100 mg
  Filled 2020-02-09 (×3): qty 2

## 2020-02-09 MED ORDER — TOPIRAMATE 25 MG PO TABS
50.0000 mg | ORAL_TABLET | Freq: Every day | ORAL | Status: DC
  Administered 2020-02-10 – 2020-02-11 (×2): 50 mg
  Filled 2020-02-09 (×2): qty 2

## 2020-02-09 MED ORDER — NOREPINEPHRINE 16 MG/250ML-% IV SOLN
0.0000 ug/min | INTRAVENOUS | Status: DC
  Administered 2020-02-09: 20 ug/min via INTRAVENOUS
  Administered 2020-02-09: 28 ug/min via INTRAVENOUS
  Administered 2020-02-10: 21 ug/min via INTRAVENOUS
  Administered 2020-02-10: 40 ug/min via INTRAVENOUS
  Administered 2020-02-11: 45 ug/min via INTRAVENOUS
  Administered 2020-02-11 (×2): 40 ug/min via INTRAVENOUS
  Filled 2020-02-09 (×7): qty 250

## 2020-02-09 MED ORDER — PANTOPRAZOLE SODIUM 40 MG PO PACK
40.0000 mg | PACK | Freq: Two times a day (BID) | ORAL | Status: DC
  Administered 2020-02-09 – 2020-02-11 (×6): 40 mg
  Filled 2020-02-09 (×7): qty 20

## 2020-02-09 MED ORDER — PRO-STAT SUGAR FREE PO LIQD
30.0000 mL | Freq: Four times a day (QID) | ORAL | Status: DC
  Administered 2020-02-09 – 2020-02-11 (×8): 30 mL
  Filled 2020-02-09 (×7): qty 30

## 2020-02-09 MED ORDER — NORTRIPTYLINE HCL 25 MG PO CAPS
25.0000 mg | ORAL_CAPSULE | Freq: Every day | ORAL | Status: DC
  Administered 2020-02-09 – 2020-02-11 (×3): 25 mg
  Filled 2020-02-09 (×5): qty 1

## 2020-02-09 MED ORDER — SODIUM CHLORIDE 0.9% IV SOLUTION: Freq: Once | INTRAVENOUS | Status: AC

## 2020-02-09 MED ORDER — MONTELUKAST SODIUM 10 MG PO TABS
10.0000 mg | ORAL_TABLET | Freq: Every day | ORAL | Status: DC
  Administered 2020-02-11: 09:00:00 10 mg
  Filled 2020-02-09: qty 1

## 2020-02-09 NOTE — Progress Notes (Signed)
Nutrition Follow-up  DOCUMENTATION CODES:   Severe malnutrition in context of chronic illness, Not applicable  INTERVENTION:   Tube Feeding:  Pivot 1.5 at 75 ml/hr Pro-Stat 30 mL QID Provides 3100 kcals, 229 g of protein and 1368 mL of free water Meets 100% estimated protein and calorie needs  Check Vitamin B-12, C, D, A, zinc and copper. Pt already receiving thiamine supplementation. Pt chronic non-healing wounds and malnutrition; at risk for multiple deficiencies   NUTRITION DIAGNOSIS:   Severe Malnutrition related to chronic illness as evidenced by severe fat depletion, severe muscle depletion, edema.  Being addressed via TF   GOAL:   Patient will meet greater than or equal to 90% of their needs  Progressing  MONITOR:   Vent status, TF tolerance, Labs, Weight trends, Skin  REASON FOR ASSESSMENT:   Consult Assessment of nutrition requirement/status  ASSESSMENT:   44 yo female admitted with severe weakness and fatigue with sepsis and non-healing wounds and found to have necrotizing abdominal wall infection of pannus. PMH includes non-uremic calciphylaxis without CKD (dx several months ago at [facility]), chronic pain, chronic anemia, morbid obesity   3/24 Admit 3/28 Intubated, OR for debridement of skin, soft tissue and muscle of abdominal wall infection/panniculectomy (587 cm x 16 cm x 15 cm) 3/29 Cortrak, TF initiated  Noted plan to return to OR tomorrow  Patient is currently intubated on ventilator support, requiring levophed, sedated MV: 15.1 L/min Temp (24hrs), Avg:100.7 F (38.2 C), Min:99.1 F (37.3 C), Max:102.9 F (39.4 C)  Propofol: none  OG tube in place; plan to exchange out for Cortrak today  Weight from 3/23 226.8 kg; no new weights  Labs: reviewed Meds: thiamine  NUTRITION - FOCUSED PHYSICAL EXAM:    Most Recent Value  Orbital Region  Severe depletion  Upper Arm Region  No depletion  Thoracic and Lumbar Region   Unable to assess  Buccal Region  Severe depletion  Temple Region  Severe depletion  Clavicle Bone Region  Severe depletion  Clavicle and Acromion Bone Region  Severe depletion  Scapular Bone Region  Severe depletion  Dorsal Hand  Unable to assess  Patellar Region  Unable to assess  Anterior Thigh Region  Unable to assess  Posterior Calf Region  Unable to assess  Edema (RD Assessment)  Moderate  Hair  -- [brittle, easily plucked]  Eyes  Unable to assess  Mouth  -- [poor dentition, broken teet, tongue pale]  Skin  -- [scar tissue, ecchymosis, petichiae, wounds]  Nails  Unable to assess       Diet Order:   Diet Order            Diet NPO time specified Except for: Sips with Meds  Diet effective now              EDUCATION NEEDS:   No education needs have been identified at this time  Skin:  Skin Assessment: Skin Integrity Issues: Skin Integrity Issues:: Other (Comment) Other: calciphylaxis (rt elbow and bilateral chest); rt wrist bruise 2/2 IV filtration; scabbing lt lower leg; scars on rt leg; non-pressure wounds (rt lateral hip, lt hip, and rt upper leg)  Last BM:  02/05/20  Height:   Ht Readings from Last 1 Encounters:  01/30/2020 5\' 5"  (1.651 m)    Weight:   Wt Readings from Last 1 Encounters:  01/20/2020 (!) 226.8 kg    Ideal Body Weight:  56.8 kg  BMI:  Body mass index is 83.2 kg/m.  Estimated Nutritional Needs:   Kcal:  3000-3500 kcals  Protein:  225 g  Fluid:  >/= 2 L    Kerman Passey MS, RDN, LDN, CNSC RD Pager Number and Weekend/On-Call After Hours Pager Located in [place]

## 2020-02-09 NOTE — Progress Notes (Signed)
[place] Progress Note Patient Name: Camiya Vinal DOB: 1976/04/04 MRN: [id number]   Date of Service  02/09/2020  HPI/Events of Note  isat Hg > 10. CBC hg < 7. No active bleeding. On Levo at 28.  Discussed with bed side RN.  eICU Interventions  1 PRBC ordered. Follow Hg Watch for any bleeding.      Intervention Category Intermediate Interventions: Other:  Elmer Sow 02/09/2020, 2:15 AM

## 2020-02-09 NOTE — Progress Notes (Signed)
NAME:  Megan Ortiz, MRN:  [id number], DOB:  04-26-1976, LOS: 6 ADMISSION DATE:  01/29/2020, CONSULTATION DATE:  02/04/20 REFERRING MD:  [facility], CHIEF COMPLAINT:  weakness  Brief History   Patient with history of calciphylaxis presented to [facility] emergency room with complaints of general malaise, treated for sepsis of unk source.   History of present illness   44 year old morbidly obese female with history of calciphylaxis diagnosed several months ago at [facility], hx DVT/PE, chronic anemia, presents with weakness and general malaise.  On presentation patient was found to have a leukocytosis of 50,000 with a hemoglobin of 5.6, platelet count of 397, lactic acid 8.0.  Patient's creatinine is 0.85 calcium of 8.8 with a phosphorus of 5.3. Relative hypotension  In summary: Hypotension, WBC, anemia, lactic acidosis.    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 Transferred to ICU  Surgery consulted.   3/28: Intubated for worsening AMS.  OR for debridement of pannus cellulitis/gas on CT abd.  Debridement of nec fas of pannus.  500cc EBL, 2 U PRBC.  Oxygenation issues.   Plan for return to OR 3/29.  Consults:  Wound care  Procedures:  NA  Significant Diagnostic Tests:  CT abd/pelv:  IMPRESSION: 1. Significant portions of the study are nearly nondiagnostic secondary to lack of IV contrast and the patient's body habitus. 2. There are pockets of subcutaneous gas with surrounding edema and overlying skin thickening involving the patient's pannus. These findings are concerning for a necrotizing infectious process in the appropriate clinical setting. There is no well-formed  drainable fluid collection. 3. Massive hepatomegaly with hepatic steatosis. 4. Diffuse bilateral ground-glass airspace opacities concerning for an atypical infectious process. 5. Large hiatal hernia. 6. Borderline splenomegaly. 7. Cardiomegaly with a small to moderate-sized pericardial effusion. 8. IVC filter in place with significant caval penetration.   Micro Data:  Blood 3/23 >>  Urine 3/24 >>  Blood cx 3/27  Antimicrobials:  Zosyn 3/23  >>3/26 Vancomycin 3/23 >>   Cefepime 3/27--> Flagyl 3/27 --> Clindamycin 3/27 -->     Interim history/subjective:   Still tachycardic overnight, increasing pressor needs, febrile overnight.  I/o + 4.6 uop 415 overnight  Objective   Blood pressure (!) 122/59, pulse (!) 125, temperature 100.2 F (37.9 C), temperature source Oral, resp. rate (!) 25, height 5\' 5"  (1.651 m), weight (!) 226.8 kg, SpO2 99 %. CVP:  [3 mmHg-11 mmHg] 11 mmHg  Vent Mode: PRVC FiO2 (%):  [40 %-100 %] 40 % Set Rate:  [16 bmp-28 bmp] 28 bmp Vt Set:  [450 mL] 450 mL PEEP:  [5 cmH20-6 cmH20] 6 cmH20 Plateau Pressure:  [17 cmH20-26 cmH20] 17 cmH20   Intake/Output Summary (Last 24 hours) at 02/09/2020 2979 Last data filed at 02/09/2020 0600 Gross per 24 hour  Intake 5571.61 ml  Output 915 ml  Net 4656.61 ml   Filed Weights   01/24/2020 1649  Weight: (!) 226.8 kg    Examination: General: Morbidly obese woman, inbutated, non responsive HENT: Oropharynx clear, edentulous Lungs: Distant but clear, decreased at both bases Cardiovascular: Regular, tachycardic 120s, no mgr Abdomen: Nondistended, obese, hypoactive bowel sounds Extremities: non pitting edema Skin: Decubitus wounds right buttock, left upper thigh, right hip, left buttock/hip unstageable with  eschar.   Open surgical wound on abdominal pannus, wound is dressed, see surgical not for photos.  Neuro: lethargic but arousable when stimulated.  perrl  Resolved Hospital Problem list   NA  Assessment &  Plan:  Nec fasciitis of pannus: fever, hypotension, altered mental status, septic shock. Metabolic acidosis.  S/p surgery for debridement.  Per surgery, will likely take back to OR tomorrow.  Leukocytosis worsened, now slightly improved today.  Remains on clinda, cefepime, vanc.  Repeat blood cultures pending Starting bicarb infusion. Recheck ABG and  H/h today.   Anemia:  Hx microcytic chronic anemia, now acute 2/2 BL during surgery.  Recheck h/h today. Restart ac for chronic dvt and hx PE if no further signs of bleeding.    Pericardial effusion. Small to moderate.  No tamponade on echo.      Hypoglycemia:  Improving today, will reduce rate of D10, cont q 4 BS.   Altered mental status: Likely 2/2 worsening sepsis.     Hypoxemic resp failure: worsening edema, atelectasis.  Possible PNA but no cough, no sputum production.  Stable now on vent.    Hepatic steatosis, massive hepatomegaly Ammonia 51 AP slightly elevated nl AST/ALT Bili.   Calciphylaxis with the wounds as described above:  Dx after 05/2020 after she had been started on warfarin.  Warfarin necrosis vs more likely calciphylaxis per dermatology (biopsy done in 2020). Has been following with dermatology as OP.  Wound care consulting.   Dakin's moistened gauze into an overall wounds except the left buttock and bilateral lower extremities.  Areas with eschar twice daily application Silvadene and ABD/gauze Bariatric bed  Protein cal malnutrition; Alb 1 Contributing to anasarca/edema.   Chronic pain secondary to calciphylaxis:  Typically on tramadol and oxycodone.    Hx DVT and PE, with IVC filter PE and IVC filter done >10 years ago, per my conversation with spouse.   She was not on chronic ac until 7/21 when diagnosed with RLE dvt.   Plan for lifelong AC per her spouse.   Of note, by CT non con, "IVC filter in place with significant caval penetration".  Will need to address once acute illness resolved.  If no  further drop in h/h and no signs of bleeding, restart heparin, hold at midnight for surgery tomorrow   Best practice:  Diet: npo for now, will start tube feeds after surgery.   Pain/Anxiety/Delirium protocol (if indicated): As needed VAP protocol (if indicated): N/A DVT prophylaxis: restarting heparin gt.  GI prophylaxis: N/A Glucose control: We will monitor Mobility: Bedrest Code Status: Full Family Communication: Updated patient and husband in full 3/29.  He is distraught.   Disposition: ICU  Labs   CBC: Recent Labs  Lab 01/21/2020 1756 02/04/20 0552 02/06/20 0322 02/06/20 0322 02/07/20 0228 02/07/20 2314 01/24/2020 0348 01/18/2020 0449 01/16/2020 2026 01/23/2020 2120 02/03/2020 2243 02/09/20 0101 02/09/20 0113  WBC 50.1*   < > 29.0*  --  37.9*  --  40.9*  --   --  54.7*  --  64.0*  --   NEUTROABS 43.7*  --   --   --  32.8*  --   --   --   --  39.7*  --   --   --   HGB 5.6*   < > 8.3*   < > 8.0*   < > 7.3*   < > 8.2* 7.0* 8.5* 6.9* 10.9*  HCT 20.6*   < > 28.4*   < > 27.2*   < >  25.3*   < > 24.0* 23.9* 25.0* 23.3* 32.0*  MCV 99.0   < > 94.4  --  94.4  --  95.1  --   --  98.0  --  97.5  --   PLT 397   < > 357  --  428*  --  472*  --   --  458*  --  481*  --    < > = values in this interval not displayed.    Basic Metabolic Panel: Recent Labs  Lab 02/04/20 1921 02/04/20 1921 02/05/20 0252 02/05/20 0252 02/05/20 2348 02/05/20 2348 02/06/20 0322 02/06/20 0322 02/07/20 0228 02/07/20 0228 02/07/20 1915 02/07/20 2314 01/23/2020 0348 01/15/2020 0449 01/21/2020 0802 02/07/2020 1920 01/30/2020 2026 02/07/2020 2120 02/02/2020 2243 02/09/20 0101 02/09/20 0113  NA 130*   < > 131*   < > 131*   < > 130*   < > 131*   < > 131*   < >  --    < > 131*   < > 134* 132* 133* 132* 132*  K 4.7   < > 4.1   < > 3.6   < > 3.6   < > 4.0   < > 4.1   < >  --    < > 4.0   < > 4.0 3.9 4.0 4.1 4.0  CL 99   < > 99   < > 98   < > 98   < > 98  --  99  --   --   --  100  --   --  101  --  101  --   CO2 16*   <  > 19*   < > 19*   < > 19*   < > 20*  --  18*  --   --   --  16*  --   --  20*  --  19*  --   GLUCOSE 108*   < > 73   < > 108*   < > 103*   < > 86  --  83  --   --   --  83  --   --  124*  --  141*  --   BUN 9   < > 7   < > 11   < > 11   < > 18  --  25*  --   --   --  29*  --   --  33*  --  32*  --   CREATININE 0.95   < > 0.84   < > 0.94   < > 0.89   < > 0.78  --  1.13*  --   --   --  1.09*  --   --  1.23*  --  1.29*  --   CALCIUM 7.9*   < > 8.4*   < > 8.5*   < > 8.4*   < > 8.8*  --  8.6*  --   --   --  8.2*  --   --  7.7*  --  7.4*  --   MG 1.6*   < > 1.6*  --  2.2  --  2.1  --   --   --   --   --  2.2  --   --   --   --  2.3  --   --   --   PHOS 4.0  --  4.2  --   --   --   --   --   --   --   --   --  4.7*  --   --   --   --   --   --   --   --    < > = values in this interval not displayed.   GFR: Estimated Creatinine Clearance: 109.7 mL/min (A) (by C-G formula based on SCr of 1.29 mg/dL (H)). Recent Labs  Lab 02/04/20 1921 02/05/20 0252 02/07/20 0228 02/07/20 0903 02/07/20 1147 02/09/2020 0348 02/07/2020 0802 01/20/2020 1029 01/17/2020 2120 02/09/20 0101 02/09/20 0510  PROCALCITON 14.97  --   --   --   --   --   --   --   --   --   --   WBC 32.6*   < > 37.9*  --   --  40.9*  --   --  54.7* 64.0*  --   LATICACIDVEN 6.0*   < >  --    < >   < >  --  3.4* 3.1*  --  2.6* 2.5*   < > = values in this interval not displayed.    Liver Function Tests: Recent Labs  Lab 01/12/2020 1756 02/04/20 1921 02/07/20 1915  AST 25 21 40  ALT 13 13 24   ALKPHOS 200* 191* 168*  BILITOT 0.3 0.8 1.0  PROT 6.4* 5.7* 6.3*  ALBUMIN 1.3* <1.0* 1.0*   No results for input(s): LIPASE, AMYLASE in the last 168 hours. Recent Labs  Lab 02/07/20 1915  AMMONIA 51*    ABG    Component Value Date/Time   PHART 7.226 (L) 02/09/2020 0113   PCO2ART 48.5 (H) 02/09/2020 0113   PO2ART 99.0 02/09/2020 0113   HCO3 20.0 02/09/2020 0113   TCO2 21 (L) 02/09/2020 0113   ACIDBASEDEF 7.0 (H) 02/09/2020 0113   O2SAT  96.0 02/09/2020 0113     Coagulation Profile: Recent Labs  Lab 01/28/2020 1839 02/04/20 1921 02/06/2020 0802  INR 1.4* 1.4* 1.6*    Cardiac Enzymes: No results for input(s): CKTOTAL, CKMB, CKMBINDEX, TROPONINI in the last 168 hours.  HbA1C: Hgb A1c MFr Bld  Date/Time Value Ref Range Status  02/04/2020 07:21 PM 5.1 4.8 - 5.6 % Final    Comment:    (NOTE) Pre diabetes:          5.7%-6.4% Diabetes:              >6.4% Glycemic control for   <7.0% adults with diabetes   02/26/2017 04:52 AM 5.4 4.8 - 5.6 % Final    Comment:    (NOTE)         Pre-diabetes: 5.7 - 6.4         Diabetes: >6.4         Glycemic control for adults with diabetes: <7.0     CBG: Recent Labs  Lab 01/30/2020 1147 01/22/2020 1533 01/22/2020 2021 01/25/2020 2323 02/09/20 0348  GLUCAP 84 100* 108* 132* 128*     Critical care time: 33 minutes     Collier Bullock MD PCCM

## 2020-02-09 NOTE — Progress Notes (Signed)
CRITICAL VALUE ALERT  Critical Value:  Hgb 6.9  Date & Time Notied:  02/09/20 @ 0141  Provider Notified: Warren Lacy  Orders Received/Actions taken: TBD

## 2020-02-09 NOTE — Progress Notes (Signed)
1 Day Post-Op  Subjective: CC: On vent. On Norepi. Febrile overnight.   Objective: Vital signs in last 24 hours: Temp:  [98 F (36.7 C)-101.4 F (38.6 C)] 101.4 F (38.6 C) (03/29 0739) Pulse Rate:  [119-135] 128 (03/29 0855) Resp:  [15-32] 31 (03/29 0855) BP: (97-136)/(45-74) 130/62 (03/29 0700) SpO2:  [89 %-100 %] 99 % (03/29 0855) Arterial Line BP: (85-149)/(35-64) 120/45 (03/29 0700) FiO2 (%):  [40 %-100 %] 40 % (03/29 0855) Last BM Date: 02/05/20  Intake/Output from previous day: 03/28 0701 - 03/29 0700 In: 5805.6 [I.V.:3879.6; Blood:625; IV Piggyback:1301] Out: 915 [Urine:415; Blood:500] Intake/Output this shift: No intake/output data recorded.  Vent Mode: PRVC FiO2 (%):  [40 %-100 %] 40 % Set Rate:  [16 bmp-28 bmp] 28 bmp Vt Set:  [450 mL] 450 mL PEEP:  [5 cmH20-6 cmH20] 5 cmH20 Plateau Pressure:  [17 cmH20-26 cmH20] 18 cmH20   PE: Gen: On vent Abd: See picture below. Large wound of the right panus extending to the left measuring ~57cm. The right portion of the wound and base is with healthy appearing tissue. Sutures noted at the left lower lateral portion of the wound edge without evidence of bleeding. There is noted dry, fatty tissue without much granulation on the most left aspect of the wound with gray/clear fluid that has drained from this portion and colleted at the base. There is dusky, blue/purple skin bridging between the surgical wound and her chronic wound on the left pannus (picture 2 and 3). The left panus wound is clean with healthy granulation tissue at the base.          Lab Results:  Recent Labs    02/09/20 0101 02/09/20 0101 02/09/20 0113 02/09/20 0658  WBC 64.0*  --   --  49.5*  HGB 6.9*   < > 10.9* 7.7*  HCT 23.3*   < > 32.0* 25.7*  PLT 481*  --   --  456*   < > = values in this interval not displayed.   BMET Recent Labs    01/21/2020 2120 01/25/2020 2243 02/09/20 0101 02/09/20 0113  NA 132*   < > 132* 132*  K 3.9   < >  4.1 4.0  CL 101  --  101  --   CO2 20*  --  19*  --   GLUCOSE 124*  --  141*  --   BUN 33*  --  32*  --   CREATININE 1.23*  --  1.29*  --   CALCIUM 7.7*  --  7.4*  --    < > = values in this interval not displayed.   PT/INR Recent Labs    02/02/2020 0802  LABPROT 19.2*  INR 1.6*   CMP     Component Value Date/Time   NA 132 (L) 02/09/2020 0113   NA 132 (A) 03/09/2017 0000   K 4.0 02/09/2020 0113   CL 101 02/09/2020 0101   CO2 19 (L) 02/09/2020 0101   GLUCOSE 141 (H) 02/09/2020 0101   BUN 32 (H) 02/09/2020 0101   BUN 10 03/09/2017 0000   CREATININE 1.29 (H) 02/09/2020 0101   CALCIUM 7.4 (L) 02/09/2020 0101   PROT 6.3 (L) 02/07/2020 1915   ALBUMIN 1.0 (L) 02/07/2020 1915   AST 40 02/07/2020 1915   ALT 24 02/07/2020 1915   ALKPHOS 168 (H) 02/07/2020 1915   BILITOT 1.0 02/07/2020 1915   GFRNONAA 50 (L) 02/09/2020 0101   GFRAA 58 (L) 02/09/2020 0101  Lipase     Component Value Date/Time   LIPASE 34 05/29/2019 1920       Studies/Results: CT ABDOMEN PELVIS WO CONTRAST  Result Date: 02/07/2020 CLINICAL DATA:  Sepsis.  Multiple deep soft tissue wounds EXAM: CT ABDOMEN AND PELVIS WITHOUT CONTRAST TECHNIQUE: Multidetector CT imaging of the abdomen and pelvis was performed following the standard protocol without IV contrast. COMPARISON:  CT dated May 29, 2019 FINDINGS: Lower chest: There are diffuse bilateral ground-glass airspace opacities.The heart is enlarged. There is a small to moderate-sized pericardial effusion. Hepatobiliary: There is decreased hepatic attenuation suggestive of hepatic steatosis. The liver is massively enlarged measuring approximately 31 cm craniocaudad. Status post cholecystectomy.There is no biliary ductal dilation. Pancreas: Not well evaluated secondary to the patient's body habitus. Spleen: The spleen is borderline enlarged. Adrenals/Urinary Tract: --Adrenal glands: No adrenal hemorrhage. --Right kidney/ureter: Not well evaluated secondary to the  patient's body habitus. --Left kidney/ureter: Not well evaluated secondary to the patient's body habitus. --Urinary bladder: Not well evaluated. Stomach/Bowel: --Stomach/Duodenum: There is a large hiatal hernia. --Small bowel: Small bowel does not appear to be dilated but evaluation is significantly limited. --Colon: There appears to be an above average amount of stool in the colon. Colon is poorly evaluated. --Appendix: Not visualized. Vascular/Lymphatic: There is an IVC filter in place with significant penetration outside the cava. There is a calcification at the level of the hepatic IVC which may represent chronic thrombus. --No retroperitoneal lymphadenopathy. --No mesenteric lymphadenopathy. Reproductive: Not well evaluated. Other: In the patient's low midline pannus, there are extensive inflammatory changes with pockets of subcutaneous gas and overlying skin thickening. There is no definite well-formed drainable fluid collection, however evaluation is limited by lack of IV contrast and the patient's body habitus. The abdominal wall is normal. Musculoskeletal. No acute displaced fractures. IMPRESSION: 1. Significant portions of the study are nearly nondiagnostic secondary to lack of IV contrast and the patient's body habitus. 2. There are pockets of subcutaneous gas with surrounding edema and overlying skin thickening involving the patient's pannus. These findings are concerning for a necrotizing infectious process in the appropriate clinical setting. There is no well-formed drainable fluid collection. 3. Massive hepatomegaly with hepatic steatosis. 4. Diffuse bilateral ground-glass airspace opacities concerning for an atypical infectious process. 5. Large hiatal hernia. 6. Borderline splenomegaly. 7. Cardiomegaly with a small to moderate-sized pericardial effusion. 8. IVC filter in place with significant caval penetration. These results will be called to the ordering clinician or representative by the  Radiologist Assistant, and communication documented in the PACS or [employer]. Electronically Signed   By: Constance Holster M.D.   On: 02/07/2020 18:49   DG Chest 1 View  Result Date: 02/07/2020 CLINICAL DATA:  Dyspnea. EXAM: CHEST  1 VIEW COMPARISON:  Chest x-rays dated 02/05/2020 and 01/21/2020 and chest CT dated 05/29/2019 FINDINGS: The patient has developed hazy bilateral pulmonary infiltrates, most prominent in the right upper lobe. There is slight pulmonary vascular prominence. Heart size is within normal limits considering the large chronic hiatal hernia which extensively 8 6 the mediastinal silhouette. No bone abnormality. IMPRESSION: New bilateral pulmonary infiltrates, most prominent in the right upper lobe. Electronically Signed   By: [name] M.D.   On: 02/07/2020 13:50   DG Abd 1 View  Result Date: 01/29/2020 CLINICAL DATA:  OG tube placement. EXAM: ABDOMEN - 1 VIEW 4:48 p.m. COMPARISON:  11/09/2020 at 12:03 a.m. FINDINGS: OG tube tip is in the left mid abdomen likely in the body of the stomach. Moderate  stool in the left side of the colon. No fecal impaction. Bowel gas pattern is normal. IVC filter in place. Surgical clips in the mid abdomen, unchanged. No acute bone abnormality. IMPRESSION: OG tube in good position. Electronically Signed   By: [name] M.D.   On: 02/07/2020 17:09   CT HEAD WO CONTRAST  Result Date: 02/07/2020 CLINICAL DATA:  Encephalopathy. Septic shock. EXAM: CT HEAD WITHOUT CONTRAST TECHNIQUE: Contiguous axial images were obtained from the base of the skull through the vertex without intravenous contrast. COMPARISON:  05/30/2019 FINDINGS: Brain: There is streak artifact through the skull base from the patient's left shoulder limiting assessment of the posterior fossa and temporal lobes. Within this limitation, no acute infarct, intracranial hemorrhage, mass, midline shift, or extra-axial fluid collection is identified. The ventricles and sulci are  normal. Vascular: No hyperdense vessel. Skull: No fracture or suspicious osseous lesion. Sinuses/Orbits: Partially visualized mild mucosal thickening inferiorly in the right maxillary sinus. Clear mastoid air cells. Chronic calcification posteriorly in both ocular globes. Other: None. IMPRESSION: No evidence of acute intracranial abnormality. Electronically Signed   By: Logan Bores M.D.   On: 02/07/2020 18:37   DG CHEST PORT 1 VIEW  Result Date: 02/07/2020 CLINICAL DATA:  Check endotracheal tube placement EXAM: PORTABLE CHEST 1 VIEW COMPARISON:  02/02/2020 FINDINGS: Cardiac shadow is stable. Gastric catheter is noted extending into the stomach stable from the prior exam. Endotracheal tube has been withdrawn and now lies approximately 2 cm above the carina. Patchy opacities are again identified within both lungs stable from the prior exam. Stable right jugular central line at the cavoatrial junction. No bony abnormality is seen. IMPRESSION: Tubes and lines as described in satisfactory position. Patchy opacities bilaterally stable from the prior exam. Electronically Signed   By: Inez Catalina M.D.   On: 01/16/2020 22:33   DG CHEST PORT 1 VIEW  Result Date: 01/20/2020 CLINICAL DATA:  Encounter for intubation. EXAM: PORTABLE CHEST 1 VIEW COMPARISON:  02/07/2020 FINDINGS: An endotracheal tube is been inserted. The tip is in the proximal right mainstem bronchus and needs to be retracted approximately 4 cm. Central venous catheter tip is above the cavoatrial junction in good position approximately 3 cm below the carina. This is unchanged. New NG tube tip is below the diaphragm likely in the body of the stomach. New infiltrate in the right upper lobe with increased hazy infiltrate throughout the left lung. No effusions. Heart size and vascularity are normal. No bone abnormality. IMPRESSION: 1. Endotracheal tube tip is in the proximal right mainstem bronchus and needs to be retracted approximately 4 cm. 2. Increasing  bilateral pulmonary infiltrates. Critical Value/emergent results were called by telephone at the time of interpretation on 02/07/2020 at 5:05 pm to Shanon Brow, RN , who verbally acknowledged these results. Electronically Signed   By: [name] M.D.   On: 01/20/2020 17:08   DG CHEST PORT 1 VIEW  Result Date: 02/07/2020 CLINICAL DATA:  Central line placement. EXAM: PORTABLE CHEST 1 VIEW COMPARISON:  Radiograph earlier this day, lung bases from abdominal CT earlier today. FINDINGS: Right internal jugular central venous catheter tip projects over the lower SVC. No pneumothorax. The patient is rotated. Cardiomegaly is not significantly changed. There is a retrocardiac hiatal hernia. Patchy bilateral lung opacities, slight suprahilar prominence, unchanged from earlier today. No large pleural effusion. IMPRESSION: 1. Tip of the right internal jugular central venous catheter projects over the lower SVC. No pneumothorax. 2. Stable cardiomegaly. Patchy bilateral lung opacities, slight suprahilar prominence, unchanged  from earlier today. Hiatal hernia. Electronically Signed   By: Keith Rake M.D.   On: 02/07/2020 23:16   DG Abd Portable 1V  Result Date: 01/28/2020 CLINICAL DATA:  NG tube placement. EXAM: PORTABLE ABDOMEN - 1 VIEW COMPARISON:  Abdominal CT yesterday. FINDINGS: Tip and side port of the enteric tube below the diaphragm in the stomach. An IVC filter is in place. More detailed evaluation is limited by habitus. IMPRESSION: Tip and side port of the enteric tube below the diaphragm in the stomach. Electronically Signed   By: Keith Rake M.D.   On: 01/21/2020 00:16   ECHOCARDIOGRAM COMPLETE  Result Date: 02/09/2020    ECHOCARDIOGRAM REPORT   Patient Name:   JANESHA BRISSETTE Date of Exam: 01/20/2020 Medical Rec #:  [id number]         Height:       65.0 in Accession #:    [id number]        Weight:       500.0 lb Date of Birth:  [birth year]          BSA:          2.920 m Patient Age:    24 years           BP:           108/66 mmHg Patient Gender: F                 HR:           129 bpm. Exam Location:  Inpatient Procedure: 2D Echo Indications:    Pericardial effusion 423.9 / I31.3  History:        Patient has prior history of Echocardiogram examinations, most                 recent 07/26/2019. Hx DVT/PE with IVC filter, chronic anemia.                 Altered mental status. Hypoxemic resp failure. History of                 tachycardia.  Sonographer:    Darlina Sicilian RDCS Referring Phys: [id number] [place]  1. Left ventricular ejection fraction, by estimation, is 70 to 75%. The left ventricle has hyperdynamic function. The left ventricle has no regional wall motion abnormalities. Indeterminate diastolic filling due to E-A fusion.  2. Right ventricular systolic function is hyperdynamic. The right ventricular size is normal. There is normal pulmonary artery systolic pressure. The estimated right ventricular systolic pressure is 47.6 mmHg.  3. Moderate pericardial effusion. The pericardial effusion is posterior to the left ventricle. There is no evidence of cardiac tamponade.  4. The mitral valve is grossly normal. Mild mitral valve regurgitation. No evidence of mitral stenosis.  5. The aortic valve is tricuspid. Aortic valve regurgitation is not visualized. No aortic stenosis is present.  6. The inferior vena cava is normal in size with greater than 50% respiratory variability, suggesting right atrial pressure of 3 mmHg. Comparison(s): A prior study was performed on 07/26/2019. No major change. Pericardial effusion was small 07/26/2019 and is now small to moderate. There is no evidence of tamponade. FINDINGS  Left Ventricle: Left ventricular ejection fraction, by estimation, is 70 to 75%. The left ventricle has hyperdynamic function. The left ventricle has no regional wall motion abnormalities. The left ventricular internal cavity size was normal in size. There is no left ventricular hypertrophy.  Indeterminate diastolic filling due to E-A fusion. Right Ventricle:  The right ventricular size is normal. No increase in right ventricular wall thickness. Right ventricular systolic function is hyperdynamic. There is normal pulmonary artery systolic pressure. The tricuspid regurgitant velocity is 2.79 m/s, and with an assumed right atrial pressure of 3 mmHg, the estimated right ventricular systolic pressure is 93.7 mmHg. Left Atrium: Left atrial size was normal in size. Right Atrium: Right atrial size was normal in size. Pericardium: A moderately sized pericardial effusion is present. The pericardial effusion is posterior to the left ventricle. There is no evidence of cardiac tamponade. Presence of pericardial fat pad. Mitral Valve: The mitral valve is grossly normal. Mild mitral valve regurgitation. No evidence of mitral valve stenosis. Tricuspid Valve: The tricuspid valve is grossly normal. Tricuspid valve regurgitation is trivial. No evidence of tricuspid stenosis. Aortic Valve: The aortic valve is tricuspid. Aortic valve regurgitation is not visualized. No aortic stenosis is present. Pulmonic Valve: The pulmonic valve was grossly normal. Pulmonic valve regurgitation is not visualized. No evidence of pulmonic stenosis. Aorta: The aortic root is normal in size and structure. Venous: The inferior vena cava is normal in size with greater than 50% respiratory variability, suggesting right atrial pressure of 3 mmHg. IAS/Shunts: The atrial septum is grossly normal.  LEFT VENTRICLE PLAX 2D LVIDd:         4.40 cm LVIDs:         3.40 cm LV PW:         1.10 cm LV IVS:        1.10 cm LVOT diam:     2.10 cm LV SV:         52 LV SV Index:   18 LVOT Area:     3.46 cm  LV Volumes (MOD) LV vol d, MOD A2C: 176.0 ml LV vol d, MOD A4C: 168.0 ml LV vol s, MOD A2C: 88.7 ml LV vol s, MOD A4C: 101.0 ml LV SV MOD A2C:     87.3 ml LV SV MOD A4C:     168.0 ml LV SV MOD BP:      77.2 ml RIGHT VENTRICLE RV S prime:     24.40 cm/s TAPSE  (M-mode): 2.6 cm LEFT ATRIUM             Index       RIGHT ATRIUM           Index LA diam:        2.60 cm 0.89 cm/m  RA Area:     11.10 cm LA Vol (A2C):   51.0 ml 17.47 ml/m RA Volume:   22.10 ml  7.57 ml/m LA Vol (A4C):   26.4 ml 9.04 ml/m LA Biplane Vol: 38.0 ml 13.01 ml/m  AORTIC VALVE LVOT Vmax:   112.00 cm/s LVOT Vmean:  75.700 cm/s LVOT VTI:    0.150 m  AORTA Ao Root diam: 2.90 cm TRICUSPID VALVE TR Peak grad:   31.1 mmHg TR Vmax:        279.00 cm/s  SHUNTS Systemic VTI:  0.15 m Systemic Diam: 2.10 cm Eleonore Chiquito MD Electronically signed by Eleonore Chiquito MD Signature Date/Time: 01/15/2020/12:30:34 PM    Final     Anti-infectives: Anti-infectives (From admission, onward)   Start     Dose/Rate Route Frequency Ordered Stop   02/07/20 2230  clindamycin (CLEOCIN) IVPB 600 mg     600 mg 100 mL/hr over 30 Minutes Intravenous Every 8 hours 02/07/20 2127     02/07/20 1400  metroNIDAZOLE (FLAGYL) tablet 500 mg  Status:  Discontinued     500 mg Oral Every 8 hours 02/07/20 1309 02/07/20 1343   02/07/20 1400  metroNIDAZOLE (FLAGYL) IVPB 500 mg  Status:  Discontinued     500 mg 100 mL/hr over 60 Minutes Intravenous Every 8 hours 02/07/20 1343 02/07/20 2134   02/07/20 1330  ceFEPIme (MAXIPIME) 2 g in sodium chloride 0.9 % 100 mL IVPB     2 g 200 mL/hr over 30 Minutes Intravenous Every 8 hours 02/07/20 1243     02/06/20 2200  vancomycin (VANCOREADY) IVPB 1750 mg/350 mL     1,750 mg 175 mL/hr over 120 Minutes Intravenous Every 24 hours 02/06/20 1034     02/06/20 0153  vancomycin variable dose per unstable renal function (pharmacist dosing)  Status:  Discontinued      Does not apply See admin instructions 02/06/20 0153 02/06/20 1034   02/04/20 2000  piperacillin-tazobactam (ZOSYN) IVPB 3.375 g  Status:  Discontinued     3.375 g 12.5 mL/hr over 240 Minutes Intravenous Every 8 hours 02/04/20 1826 02/07/20 1228   02/04/20 1730  piperacillin-tazobactam (ZOSYN) IVPB 3.375 g  Status:  Discontinued      3.375 g 12.5 mL/hr over 240 Minutes Intravenous Every 8 hours 02/04/20 1617 02/04/20 1826   02/04/20 0800  vancomycin (VANCOREADY) IVPB 2000 mg/400 mL  Status:  Discontinued     2,000 mg 200 mL/hr over 120 Minutes Intravenous Every 12 hours 02/09/2020 1908 02/06/20 0153   01/30/2020 1930  vancomycin (VANCOCIN) 2,500 mg in sodium chloride 0.9 % 500 mL IVPB     2,500 mg 250 mL/hr over 120 Minutes Intravenous  Once 01/24/2020 1858 01/28/2020 2138   01/18/2020 1830  cefTRIAXone (ROCEPHIN) 2 g in sodium chloride 0.9 % 100 mL IVPB     2 g 200 mL/hr over 30 Minutes Intravenous  Once 02/07/2020 1819 02/07/2020 1956       Assessment/Plan Sepsis ABL Anemia  Necrotizing abdominal wall infection of pannus - S/p EXCISIONAL DEBRIDEMENT OF SKIN, SOFT TISSUE, MUSCLE OF NECROTIZING ABDOMINAL WALL INFECTION/panniculectomy (57cm x 16 cm x15 cm) - Dr. Redmond Pulling - 01/24/2020 - POD #1 - WTD BID - Cont abx. Await cx's. - Discussed with MD. Will plan to take back tomorrow to allow wound to declare itself over the next 24 hours. Appreciate CCM's assistance in patients care.  - Hgb 7.7. Trend.   FEN - NPO VTE - SCDs ID - Cefepime/Clinda/Vanc. Febrile at 101.4. WBC 49.5 Foley - In place I called and updated the patients husband    LOS: 6 days    Jillyn Ledger , [facility] Surgery 02/09/2020, 9:27 AM Please see Amion for pager number during day hours 7:00am-4:30pm

## 2020-02-09 NOTE — Procedures (Signed)
Cortrak  Tube Type:  Cortrak - 43 inches Tube Location:  Right nare Initial Placement:  Stomach Secured by: Bridle Technique Used to Measure Tube Placement:  Documented cm marking at nare/ corner of mouth Cortrak Secured At:  68 cm    Cortrak Tube Team Note:  Consult received to place a Cortrak feeding tube.   No x-ray is required. RN may begin using tube.   If the tube becomes dislodged please keep the tube and contact the Cortrak team at [email or web address] (password [email or web address]) for replacement.  If after hours and replacement cannot be delayed, place a NG tube and confirm placement with an abdominal x-ray.    Knox Saliva MS, RD, LDN Please refer to [facility] for RD and/or RD on-call/weekend/after hours pager

## 2020-02-09 NOTE — Progress Notes (Signed)
eLink Physician-Brief Progress Note Patient Name: Megan Ortiz DOB: 24-Nov-1975 MRN: [id number]   Date of Service  02/09/2020  HPI/Events of Note  Hypotension I was notified that patient is now up to 28 mic of levophed. Although SBP has been 110-120, MAPs are low due to low diastolic Bps Camera assessment done - has not required sedation since coming back from OR, still very lethargic and grimaces to painful stimuli but has not woken up completely  No bleeding from the wound /belly noted H/H was improved  eICU Interventions  Suspect that she needs more fluids Will give a 1 liter LR bolus In addition, has labs coming up in the next hour Monitor urine output and mental status  I think with improvement in diastolics, her Maps will improve (MAP has been above 65 anyway) Am not adding a second pressor or steroids at this time since MAP is good and fluid challenge being given, but this will need to be considered if not improving In addition, RN to update [place] about urine output in next hour as well     Intervention Category Major Interventions: Shock - evaluation and management  Megan Ortiz G Megan Ortiz 02/09/2020, 12:04 AM

## 2020-02-09 NOTE — Progress Notes (Addendum)
anemeLink Physician-Brief Progress Note Patient Name: Megan Ortiz DOB: 1976/01/16 MRN: [id number]   Date of Service  02/09/2020  HPI/Events of Note  Hypotension I was notified that patient is now up to 28 mic of levophed. Although SBP has been 110-120, MAP now 75 on camera  Camera assessment done - has not required sedation since coming back from OR, still very lethargic and grimaces to painful stimuli but has not woken up completely  No bleeding from the wound /belly noted H/H was improved  eICU Interventions  - get foley cath for UOP. Continue care. - P/F > 200. 40% fio2. In synchrony on Vent.     Intervention Category Intermediate Interventions: Oliguria - evaluation and management  Elmer Sow 02/09/2020, 1:57 AM

## 2020-02-09 NOTE — Progress Notes (Signed)
CRITICAL VALUE ALERT  Critical Value:  WBC 64  Date & Time Notied:  02/09/20 @ 0141  Provider Notified: Warren Lacy  Orders Received/Actions taken: TBD

## 2020-02-09 NOTE — Consult Note (Signed)
Reason for Consult: Large wound of panus Referring Physician: Dr. Ninfa Linden  Megan Ortiz is an 44 y.o. female.  HPI: Patient presented to emergency room on 3/23 with complaints of general malaise. She was found to have sepsis. She is morbidly obese (BMI 83.2) with hx of calciphylaxis w/o CKD, DVT/PE, chronic pain, and chronic anemia. She has a chronic wound of the left pannus and found to have a necrotizing abdominal wall infection.  She underwent debridement of skin, soft tissue, and muscle of necrotizing abdominal wall infection/panniculectomy (57 x 16 x 15 cm) on 01/20/2020 with Dr. Redmond Pulling.  Patient is currently on a vent. She's tachycardic. Dusky skin surrounding left wound of pannus. Left wound and surgical wound packed with moistened Kerlix. On ABX regimen.    Past Medical History:  Diagnosis Date  . Anemia   . Anemia   . Anxiety   . Asthma   . DVT (deep venous thrombosis) ([place])   . Gastric ulcer   . Headache   . Hiatal hernia   . Hypertension   . Necrotizing fasciitis ([place])   . PE (pulmonary embolism)   . Tachycardia     Past Surgical History:  Procedure Laterality Date  . CESAREAN SECTION    . CHOLECYSTECTOMY    . ESOPHAGOGASTRODUODENOSCOPY (EGD) WITH PROPOFOL N/A 05/25/2019   Procedure: ESOPHAGOGASTRODUODENOSCOPY (EGD) WITH PROPOFOL;  Surgeon: [name], MD;  [facility] ENDOSCOPY;  Service: Endoscopy;  Laterality: N/A;  . INCISION AND DRAINAGE ABSCESS Left 02/16/2017   Procedure: DEBRIDEMENT LEFT BUTTOCK ABSCESS;  Surgeon: Fanny Skates, MD;  Location: [facility];  Service: General;  Laterality: Left;  . INSERTION OF DIALYSIS CATHETER Right 07/25/2019   Procedure: INSERTION OF TUNNEL DIALYSIS CATHETER;  Surgeon: Waynetta Sandy, MD;  Location: [facility];  Service: Vascular;  Laterality: Right;  . IRRIGATION AND DEBRIDEMENT ABSCESS N/A 01/30/2020   Procedure: EXCISIONAL DEBRIDEMENT OF SKIN, SOFT TISSUE, MUSCLE OF NECROTIZING ABDOMINAL WALL INFECTION.;  Surgeon:  Greer Pickerel, MD;  Location: [facility];  Service: General;  Laterality: N/A;  . IRRIGATION AND DEBRIDEMENT BUTTOCKS Left 02/19/2017   Procedure: DEBRIDEMENT OF GLUTEAL WOUND;  Surgeon: Rolm Bookbinder, MD;  Location: [facility] OR;  Service: General;  Laterality: Left;    Family History  Problem Relation Age of Onset  . Depression Mother   . Alcohol abuse Mother     Social History:  reports that she has quit smoking. Her smoking use included cigarettes. She has never used smokeless tobacco. She reports current alcohol use. She reports that she does not use drugs.  Allergies:  Allergies  Allergen Reactions  . Aspirin     Due to hx of stomach ulcers  . Coumarin     Medications: I have reviewed the patient's current medications.  Results for orders placed or performed during the hospital encounter of 01/24/2020 (from the past 48 hour(s))  Glucose, capillary     Status: None   Collection Time: 02/07/20 10:49 PM  Result Value Ref Range   Glucose-Capillary 84 70 - 99 mg/dL    Comment: Glucose reference range applies only to samples taken after fasting for at least 8 hours.  POCT I-Stat EG7     Status: Abnormal   Collection Time: 02/07/20 11:14 PM  Result Value Ref Range   pH, Ven 7.350 7.250 - 7.430   pCO2, Ven 39.1 (L) 44.0 - 60.0 mmHg   pO2, Ven 31.0 (LL) 32.0 - 45.0 mmHg   Bicarbonate 21.6 20.0 - 28.0 mmol/L   TCO2 23  22 - 32 mmol/L   O2 Saturation 56.0 %   Acid-base deficit 4.0 (H) 0.0 - 2.0 mmol/L   Sodium 132 (L) 135 - 145 mmol/L   Potassium 4.1 3.5 - 5.1 mmol/L   Calcium, Ion 1.21 1.15 - 1.40 mmol/L   HCT 25.0 (L) 36.0 - 46.0 %   Hemoglobin 8.5 (L) 12.0 - 15.0 g/dL   Patient temperature 98.8 F    Sample type VENOUS    Comment NOTIFIED PHYSICIAN   CBC     Status: Abnormal   Collection Time: 01/13/2020  3:48 AM  Result Value Ref Range   WBC 40.9 (H) 4.0 - 10.5 K/uL   RBC 2.66 (L) 3.87 - 5.11 MIL/uL   Hemoglobin 7.3 (L) 12.0 - 15.0 g/dL   HCT 25.3 (L) 36.0 - 46.0 %   MCV 95.1 80.0  - 100.0 fL   MCH 27.4 26.0 - 34.0 pg   MCHC 28.9 (L) 30.0 - 36.0 g/dL   RDW 20.6 (H) 11.5 - 15.5 %   Platelets 472 (H) 150 - 400 K/uL   nRBC 0.3 (H) 0.0 - 0.2 %    Comment: Performed at [facility], [address], [place], [place] [address]  Magnesium     Status: None   Collection Time: 01/14/2020  3:48 AM  Result Value Ref Range   Magnesium 2.2 1.7 - 2.4 mg/dL    Comment: Performed at [facility] [address], [place], [place] [address]  Phosphorus     Status: Abnormal   Collection Time: 02/07/2020  3:48 AM  Result Value Ref Range   Phosphorus 4.7 (H) 2.5 - 4.6 mg/dL    Comment: Performed at [facility] [address] [place], [place] [address]  Glucose, capillary     Status: None   Collection Time: 01/22/2020  3:48 AM  Result Value Ref Range   Glucose-Capillary 70 70 - 99 mg/dL    Comment: Glucose reference range applies only to samples taken after fasting for at least 8 hours.  I-STAT 7, (LYTES, BLD GAS, ICA, H+H)     Status: Abnormal   Collection Time: 01/19/2020  4:49 AM  Result Value Ref Range   pH, Arterial 7.416 7.350 - 7.450   pCO2 arterial 26.1 (L) 32.0 - 48.0 mmHg   pO2, Arterial 59.0 (L) 83.0 - 108.0 mmHg   Bicarbonate 16.7 (L) 20.0 - 28.0 mmol/L   TCO2 18 (L) 22 - 32 mmol/L   O2 Saturation 91.0 %   Acid-base deficit 7.0 (H) 0.0 - 2.0 mmol/L   Sodium 130 (L) 135 - 145 mmol/L   Potassium 4.1 3.5 - 5.1 mmol/L   Calcium, Ion 1.21 1.15 - 1.40 mmol/L   HCT 24.0 (L) 36.0 - 46.0 %   Hemoglobin 8.2 (L) 12.0 - 15.0 g/dL   Patient temperature 98.9 F    Sample type ARTERIAL   Glucose, capillary     Status: None   Collection Time: 01/12/2020  7:57 AM  Result Value Ref Range   Glucose-Capillary 87 70 - 99 mg/dL    Comment: Glucose reference range applies only to samples taken after fasting for at least 8 hours.  Basic metabolic panel     Status: Abnormal   Collection Time: 01/27/2020  8:02 AM  Result Value Ref Range   Sodium 131 (L) 135 - 145 mmol/L    Potassium 4.0 3.5 - 5.1 mmol/L   Chloride 100 98 - 111 mmol/L   CO2 16 (  L) 22 - 32 mmol/L   Glucose, Bld 83 70 - 99 mg/dL    Comment: Glucose reference range applies only to samples taken after fasting for at least 8 hours.   BUN 29 (H) 6 - 20 mg/dL   Creatinine, Ser 1.09 (H) 0.44 - 1.00 mg/dL   Calcium 8.2 (L) 8.9 - 10.3 mg/dL   GFR calc non Af Amer >60 >60 mL/min   GFR calc Af Amer >60 >60 mL/min   Anion gap 15 5 - 15    Comment: Performed at [facility] [address], [place], [place] [address]  Lactic acid, plasma     Status: Abnormal   Collection Time: 01/31/2020  8:02 AM  Result Value Ref Range   Lactic Acid, Venous 3.4 (HH) 0.5 - 1.9 mmol/L    Comment: CRITICAL VALUE NOTED.  VALUE IS CONSISTENT WITH PREVIOUSLY REPORTED AND CALLED VALUE. Performed at [facility], [place] [address] [place], [place] [address]   Protime-INR     Status: Abnormal   Collection Time: 01/18/2020  8:02 AM  Result Value Ref Range   Prothrombin Time 19.2 (H) 11.4 - 15.2 seconds   INR 1.6 (H) 0.8 - 1.2    Comment: (NOTE) INR goal varies based on device and disease states. Performed at [facility], [place] [address], [place], [place] [address]   Glucose, capillary     Status: None   Collection Time: 01/12/2020 10:06 AM  Result Value Ref Range   Glucose-Capillary 84 70 - 99 mg/dL    Comment: Glucose reference range applies only to samples taken after fasting for at least 8 hours.  Lactic acid, plasma     Status: Abnormal   Collection Time: 01/31/2020 10:29 AM  Result Value Ref Range   Lactic Acid, Venous 3.1 (HH) 0.5 - 1.9 mmol/L    Comment: CRITICAL VALUE NOTED.  VALUE IS CONSISTENT WITH PREVIOUSLY REPORTED AND CALLED VALUE. Performed at [facility], [place] [address] [place], [place] [address]   Glucose, capillary     Status: None   Collection Time: 02/09/2020 11:47 AM  Result Value Ref Range   Glucose-Capillary 84 70 - 99 mg/dL    Comment: Glucose reference range applies only to  samples taken after fasting for at least 8 hours.  Troponin I (High Sensitivity)     Status: None   Collection Time: 01/17/2020  2:07 PM  Result Value Ref Range   Troponin I (High Sensitivity) 15 <18 ng/L    Comment: (NOTE) Elevated high sensitivity troponin I (hsTnI) values and significant  changes across serial measurements may suggest ACS but many other  chronic and acute conditions are known to elevate hsTnI results.  Refer to the "Links" section for chest pain algorithms and additional  guidance. Performed at [facility], [place] [address] [place], [place] [address]   Glucose, capillary     Status: Abnormal   Collection Time: 02/05/2020  3:33 PM  Result Value Ref Range   Glucose-Capillary 100 (H) 70 - 99 mg/dL    Comment: Glucose reference range applies only to samples taken after fasting for at least 8 hours.  Heparin level (unfractionated)     Status: None   Collection Time: 02/09/2020  3:37 PM  Result Value Ref Range   Heparin Unfractionated 0.49 0.30 - 0.70 IU/mL    Comment: (NOTE) If heparin results are below expected values, and patient dosage has  been confirmed, suggest follow up testing  of antithrombin III levels. Performed at [facility], [place] [address] [place], [place] [address]   APTT     Status: None   Collection Time: 01/18/2020  3:37 PM  Result Value Ref Range   aPTT 34 24 - 36 seconds    Comment: Performed at [facility] [address] [place], [place] [address]  Type and screen [place]     Status: None (Preliminary result)   Collection Time: 01/22/2020  3:50 PM  Result Value Ref Range   ABO/RH(D) O POS    Antibody Screen NEG    Sample Expiration 03/11/2020,2359    Unit Number [id number]    Blood Component Type RBC LR PHER2    Unit division 00    Status of Unit ISSUED,FINAL    Transfusion Status OK TO TRANSFUSE    Crossmatch Result      Compatible Performed at [facility], [place] [address] [place],  [place] [address]    Unit Number [id number]    Blood Component Type RBC LR PHER1    Unit division 00    Status of Unit ISSUED    Transfusion Status OK TO TRANSFUSE    Crossmatch Result Compatible   I-STAT 7, (LYTES, BLD GAS, ICA, H+H)     Status: Abnormal   Collection Time: 02/05/2020  6:18 PM  Result Value Ref Range   pH, Arterial 7.098 (LL) 7.350 - 7.450   pCO2 arterial 71.8 (HH) 32.0 - 48.0 mmHg   pO2, Arterial 291.0 (H) 83.0 - 108.0 mmHg   Bicarbonate 22.2 20.0 - 28.0 mmol/L   TCO2 24 22 - 32 mmol/L   O2 Saturation 100.0 %   Acid-base deficit 8.0 (H) 0.0 - 2.0 mmol/L   Sodium 132 (L) 135 - 145 mmol/L   Potassium 4.3 3.5 - 5.1 mmol/L   Calcium, Ion 1.25 1.15 - 1.40 mmol/L   HCT 27.0 (L) 36.0 - 46.0 %   Hemoglobin 9.2 (L) 12.0 - 15.0 g/dL   Patient temperature HIDE    Sample type ARTERIAL   Aerobic/Anaerobic Culture (surgical/deep wound)     Status: None (Preliminary result)   Collection Time: 01/15/2020  7:19 PM   Specimen: PATH Other; Tissue  Result Value Ref Range   Specimen Description WOUND LOWER ABDOMEN    Special Requests NONE    Gram Stain      FEW WBC PRESENT,BOTH PMN AND MONONUCLEAR FEW GRAM POSITIVE COCCI IN PAIRS    Culture      RARE CULTURE REINCUBATED FOR BETTER GROWTH Performed at [facility], [address] [place], [place] [address]    Report Status PENDING   I-STAT 7, (LYTES, BLD GAS, ICA, H+H)     Status: Abnormal   Collection Time: 02/07/2020  7:20 PM  Result Value Ref Range   pH, Arterial 7.216 (L) 7.350 - 7.450   pCO2 arterial 57.8 (H) 32.0 - 48.0 mmHg   pO2, Arterial 304.0 (H) 83.0 - 108.0 mmHg   Bicarbonate 23.4 20.0 - 28.0 mmol/L   TCO2 25 22 - 32 mmol/L   O2 Saturation 100.0 %   Acid-base deficit 4.0 (H) 0.0 - 2.0 mmol/L   Sodium 133 (L) 135 - 145 mmol/L   Potassium 4.1 3.5 - 5.1 mmol/L   Calcium, Ion 1.18 1.15 - 1.40 mmol/L   HCT 23.0 (L) 36.0 - 46.0 %   Hemoglobin 7.8 (L) 12.0 - 15.0 g/dL   Patient temperature HIDE  Sample type  ARTERIAL   Prepare RBC (crossmatch)     Status: None   Collection Time: 01/31/2020  7:30 PM  Result Value Ref Range   Order Confirmation      ORDER PROCESSED BY BLOOD BANK Performed at [facility], [place] [address], [place], [place] [address]   Glucose, capillary     Status: Abnormal   Collection Time: 01/14/2020  8:21 PM  Result Value Ref Range   Glucose-Capillary 108 (H) 70 - 99 mg/dL    Comment: Glucose reference range applies only to samples taken after fasting for at least 8 hours.  I-STAT 7, (LYTES, BLD GAS, ICA, H+H)     Status: Abnormal   Collection Time: 01/13/2020  8:26 PM  Result Value Ref Range   pH, Arterial 7.231 (L) 7.350 - 7.450   pCO2 arterial 55.8 (H) 32.0 - 48.0 mmHg   pO2, Arterial 376.0 (H) 83.0 - 108.0 mmHg   Bicarbonate 23.4 20.0 - 28.0 mmol/L   TCO2 25 22 - 32 mmol/L   O2 Saturation 100.0 %   Acid-base deficit 4.0 (H) 0.0 - 2.0 mmol/L   Sodium 134 (L) 135 - 145 mmol/L   Potassium 4.0 3.5 - 5.1 mmol/L   Calcium, Ion 1.17 1.15 - 1.40 mmol/L   HCT 24.0 (L) 36.0 - 46.0 %   Hemoglobin 8.2 (L) 12.0 - 15.0 g/dL   Patient temperature HIDE    Sample type ARTERIAL   CBC with Differential/Platelet     Status: Abnormal   Collection Time: 01/20/2020  9:20 PM  Result Value Ref Range   WBC 54.7 (HH) 4.0 - 10.5 K/uL    Comment: WHITE COUNT CONFIRMED ON SMEAR THIS CRITICAL RESULT HAS VERIFIED AND BEEN CALLED TO E.CHEEK,RN BY MELISSA BROGDON ON 03 28 2021 AT 2211, AND HAS BEEN READ BACK.  REPEATED TO VERIFY CORRECTED ON 03/29 AT 0826: PREVIOUSLY REPORTED AS 54.7 WHITE COUNT CONFIRMED ON SMEAR THIS CRITICAL RESULT HAS VERIFIED AND BEEN CALLED TO E.CHEEK,RN BY MELISSA BROGDON ON 03 28 2021 AT 2211, AND HAS BEEN READ BACK.     RBC 2.44 (L) 3.87 - 5.11 MIL/uL   Hemoglobin 7.0 (L) 12.0 - 15.0 g/dL   HCT 23.9 (L) 36.0 - 46.0 %   MCV 98.0 80.0 - 100.0 fL   MCH 28.7 26.0 - 34.0 pg   MCHC 29.3 (L) 30.0 - 36.0 g/dL   RDW 19.4 (H) 11.5 - 15.5 %   Platelets 458 (H) 150 - 400 K/uL    nRBC 0.8 (H) 0.0 - 0.2 %   Neutrophils Relative % 72 %   Neutro Abs 39.7 (H) 1.7 - 7.7 K/uL   Lymphocytes Relative 5 %   Lymphs Abs 2.9 0.7 - 4.0 K/uL   Monocytes Relative 4 %   Monocytes Absolute 2.0 (H) 0.1 - 1.0 K/uL   Eosinophils Relative 1 %   Eosinophils Absolute 0.3 0.0 - 0.5 K/uL   Basophils Relative 0 %   Basophils Absolute 0.1 0.0 - 0.1 K/uL   WBC Morphology      MODERATE LEFT SHIFT (>5% METAS AND MYELOS,OCC PRO NOTED)    Comment: VACUOLATED NEUTROPHILS   Immature Granulocytes 18 %   Abs Immature Granulocytes 9.85 (H) 0.00 - 0.07 K/uL   Polychromasia PRESENT     Comment: Performed at [facility], [address], [place], [place] [address]  Basic metabolic panel     Status: Abnormal   Collection Time: 02/05/2020  9:20 PM  Result  Value Ref Range   Sodium 132 (L) 135 - 145 mmol/L   Potassium 3.9 3.5 - 5.1 mmol/L   Chloride 101 98 - 111 mmol/L   CO2 20 (L) 22 - 32 mmol/L   Glucose, Bld 124 (H) 70 - 99 mg/dL    Comment: Glucose reference range applies only to samples taken after fasting for at least 8 hours.   BUN 33 (H) 6 - 20 mg/dL   Creatinine, Ser 1.23 (H) 0.44 - 1.00 mg/dL   Calcium 7.7 (L) 8.9 - 10.3 mg/dL   GFR calc non Af Amer 53 (L) >60 mL/min   GFR calc Af Amer >60 >60 mL/min   Anion gap 11 5 - 15    Comment: Performed at [facility] [address] [place], [place] [address]  Magnesium     Status: None   Collection Time: 01/16/2020  9:20 PM  Result Value Ref Range   Magnesium 2.3 1.7 - 2.4 mg/dL    Comment: Performed at [facility], [place] [address] [place], [place] [address]  Pathologist smear review     Status: None   Collection Time: 01/29/2020  9:20 PM  Result Value Ref Range   Path Review Neutrophilia     Comment: Toxic granulation Mild left shift Thrombocytosis Anisopoikilocytosis Reviewed by Lennox Solders. Lyndon Code, M.D. 02/09/2020 Performed at [facility], [place] [address], [place], [place] [address]   I-STAT 7, (LYTES, BLD GAS,  ICA, H+H)     Status: Abnormal   Collection Time: 02/09/2020 10:43 PM  Result Value Ref Range   pH, Arterial 7.215 (L) 7.350 - 7.450   pCO2 arterial 55.5 (H) 32.0 - 48.0 mmHg   pO2, Arterial 322.0 (H) 83.0 - 108.0 mmHg   Bicarbonate 22.4 20.0 - 28.0 mmol/L   TCO2 24 22 - 32 mmol/L   O2 Saturation 100.0 %   Acid-base deficit 5.0 (H) 0.0 - 2.0 mmol/L   Sodium 133 (L) 135 - 145 mmol/L   Potassium 4.0 3.5 - 5.1 mmol/L   Calcium, Ion 1.18 1.15 - 1.40 mmol/L   HCT 25.0 (L) 36.0 - 46.0 %   Hemoglobin 8.5 (L) 12.0 - 15.0 g/dL   Patient temperature 99.1 F    Collection site ARTERIAL LINE    Drawn by RT    Sample type ARTERIAL   Glucose, capillary     Status: Abnormal   Collection Time: 01/24/2020 11:23 PM  Result Value Ref Range   Glucose-Capillary 132 (H) 70 - 99 mg/dL    Comment: Glucose reference range applies only to samples taken after fasting for at least 8 hours.  CBC     Status: Abnormal   Collection Time: 02/09/20  1:01 AM  Result Value Ref Range   WBC 64.0 (HH) 4.0 - 10.5 K/uL    Comment: REPEATED TO VERIFY CRITICAL VALUE NOTED.  VALUE IS CONSISTENT WITH PREVIOUSLY REPORTED AND CALLED VALUE. THIS CRITICAL RESULT HAS VERIFIED AND BEEN CALLED TO M.TOLER,RN BY MELISSA BROGDON ON 03 29 2021 AT 0139, AND HAS BEEN READ BACK.     RBC 2.39 (L) 3.87 - 5.11 MIL/uL   Hemoglobin 6.9 (LL) 12.0 - 15.0 g/dL    Comment: REPEATED TO VERIFY THIS CRITICAL RESULT HAS VERIFIED AND BEEN CALLED TO M.TOLER,RN BY MELISSA BROGDON ON 03 29 2021 AT 0139, AND HAS BEEN READ BACK.     HCT 23.3 (L) 36.0 - 46.0 %   MCV 97.5 80.0 - 100.0 fL   MCH  28.9 26.0 - 34.0 pg   MCHC 29.6 (L) 30.0 - 36.0 g/dL   RDW 19.9 (H) 11.5 - 15.5 %   Platelets 481 (H) 150 - 400 K/uL   nRBC 1.2 (H) 0.0 - 0.2 %    Comment: Performed at [facility] [address], [place], [place] [address]  Lactic acid, plasma     Status: Abnormal   Collection Time: 02/09/20  1:01 AM  Result Value Ref Range   Lactic Acid, Venous 2.6 (HH)  0.5 - 1.9 mmol/L    Comment: CRITICAL VALUE NOTED.  VALUE IS CONSISTENT WITH PREVIOUSLY REPORTED AND CALLED VALUE. Performed at [facility], [place] [address] [place], [place] [address]   Basic metabolic panel     Status: Abnormal   Collection Time: 02/09/20  1:01 AM  Result Value Ref Range   Sodium 132 (L) 135 - 145 mmol/L   Potassium 4.1 3.5 - 5.1 mmol/L   Chloride 101 98 - 111 mmol/L   CO2 19 (L) 22 - 32 mmol/L   Glucose, Bld 141 (H) 70 - 99 mg/dL    Comment: Glucose reference range applies only to samples taken after fasting for at least 8 hours.   BUN 32 (H) 6 - 20 mg/dL   Creatinine, Ser 1.29 (H) 0.44 - 1.00 mg/dL   Calcium 7.4 (L) 8.9 - 10.3 mg/dL   GFR calc non Af Amer 50 (L) >60 mL/min   GFR calc Af Amer 58 (L) >60 mL/min   Anion gap 12 5 - 15    Comment: Performed at [facility] [address] [place], [place] [address]  I-STAT 7, (LYTES, BLD GAS, ICA, H+H)     Status: Abnormal   Collection Time: 02/09/20  1:13 AM  Result Value Ref Range   pH, Arterial 7.226 (L) 7.350 - 7.450   pCO2 arterial 48.5 (H) 32.0 - 48.0 mmHg   pO2, Arterial 99.0 83.0 - 108.0 mmHg   Bicarbonate 20.0 20.0 - 28.0 mmol/L   TCO2 21 (L) 22 - 32 mmol/L   O2 Saturation 96.0 %   Acid-base deficit 7.0 (H) 0.0 - 2.0 mmol/L   Sodium 132 (L) 135 - 145 mmol/L   Potassium 4.0 3.5 - 5.1 mmol/L   Calcium, Ion 1.17 1.15 - 1.40 mmol/L   HCT 32.0 (L) 36.0 - 46.0 %   Hemoglobin 10.9 (L) 12.0 - 15.0 g/dL   Patient temperature 99.5 F    Collection site ARTERIAL LINE    Drawn by Nurse    Sample type ARTERIAL   Prepare RBC (crossmatch)     Status: None   Collection Time: 02/09/20  2:14 AM  Result Value Ref Range   Order Confirmation      BB SAMPLE OR UNITS ALREADY AVAILABLE Performed at [facility], [address] [place], [place] [address]   Glucose, capillary     Status: Abnormal   Collection Time: 02/09/20  3:48 AM  Result Value Ref Range   Glucose-Capillary 128 (H) 70 - 99 mg/dL     Comment: Glucose reference range applies only to samples taken after fasting for at least 8 hours.   Comment 1 Notify RN   Triglycerides     Status: Abnormal   Collection Time: 02/09/20  5:10 AM  Result Value Ref Range   Triglycerides 195 (H) <150 mg/dL    Comment: Performed at [facility] [address] [place], [place] [address]  Lactic acid,  plasma     Status: Abnormal   Collection Time: 02/09/20  5:10 AM  Result Value Ref Range   Lactic Acid, Venous 2.5 (HH) 0.5 - 1.9 mmol/L    Comment: CRITICAL VALUE NOTED.  VALUE IS CONSISTENT WITH PREVIOUSLY REPORTED AND CALLED VALUE. Performed at [facility], [place] [address], [place], [place] [address]   CBC with Differential/Platelet     Status: Abnormal   Collection Time: 02/09/20  6:58 AM  Result Value Ref Range   WBC 49.5 (H) 4.0 - 10.5 K/uL    Comment: WHITE COUNT CONFIRMED ON SMEAR   RBC 2.67 (L) 3.87 - 5.11 MIL/uL   Hemoglobin 7.7 (L) 12.0 - 15.0 g/dL    Comment: REPEATED TO VERIFY   HCT 25.7 (L) 36.0 - 46.0 %   MCV 96.3 80.0 - 100.0 fL   MCH 28.8 26.0 - 34.0 pg   MCHC 30.0 30.0 - 36.0 g/dL   RDW 19.0 (H) 11.5 - 15.5 %   Platelets 456 (H) 150 - 400 K/uL   nRBC 1.2 (H) 0.0 - 0.2 %   Neutrophils Relative % 69 %   Neutro Abs 34.0 (H) 1.7 - 7.7 K/uL   Lymphocytes Relative 7 %   Lymphs Abs 3.5 0.7 - 4.0 K/uL   Monocytes Relative 3 %   Monocytes Absolute 1.5 (H) 0.1 - 1.0 K/uL   Eosinophils Relative 0 %   Eosinophils Absolute 0.0 0.0 - 0.5 K/uL   Basophils Relative 0 %   Basophils Absolute 0.0 0.0 - 0.1 K/uL   WBC Morphology      MODERATE LEFT SHIFT (>5% METAS AND MYELOS,OCC PRO NOTED)   Smear Review PLATELETS APPEAR INCREASED     Comment: PLATELET CLUMPS NOTED   Immature Granulocytes 21 %   Abs Immature Granulocytes 10.54 (H) 0.00 - 0.07 K/uL    Comment: Performed at [facility], [place] [address], [place], [place] [address]  Glucose, capillary     Status: Abnormal   Collection Time: 02/09/20  7:35 AM   Result Value Ref Range   Glucose-Capillary 111 (H) 70 - 99 mg/dL    Comment: Glucose reference range applies only to samples taken after fasting for at least 8 hours.  Glucose, capillary     Status: None   Collection Time: 02/09/20 11:24 AM  Result Value Ref Range   Glucose-Capillary 97 70 - 99 mg/dL    Comment: Glucose reference range applies only to samples taken after fasting for at least 8 hours.  CBC     Status: Abnormal   Collection Time: 02/09/20 11:53 AM  Result Value Ref Range   WBC 45.0 (H) 4.0 - 10.5 K/uL   RBC 2.63 (L) 3.87 - 5.11 MIL/uL   Hemoglobin 7.6 (L) 12.0 - 15.0 g/dL   HCT 25.5 (L) 36.0 - 46.0 %   MCV 97.0 80.0 - 100.0 fL   MCH 28.9 26.0 - 34.0 pg   MCHC 29.8 (L) 30.0 - 36.0 g/dL   RDW 19.3 (H) 11.5 - 15.5 %   Platelets 453 (H) 150 - 400 K/uL   nRBC 1.1 (H) 0.0 - 0.2 %    Comment: Performed at [facility] [address] [place], [place] [address]  I-STAT 7, (LYTES, BLD GAS, ICA, H+H)     Status: Abnormal   Collection Time: 02/09/20 12:13 PM  Result Value Ref Range   pH, Arterial 7.272 (L) 7.350 - 7.450   pCO2 arterial 39.9 32.0 - 48.0  mmHg   pO2, Arterial 92.0 83.0 - 108.0 mmHg   Bicarbonate 17.9 (L) 20.0 - 28.0 mmol/L   TCO2 19 (L) 22 - 32 mmol/L   O2 Saturation 95.0 %   Acid-base deficit 8.0 (H) 0.0 - 2.0 mmol/L   Sodium 133 (L) 135 - 145 mmol/L   Potassium 4.0 3.5 - 5.1 mmol/L   Calcium, Ion 1.09 (L) 1.15 - 1.40 mmol/L   HCT 26.0 (L) 36.0 - 46.0 %   Hemoglobin 8.8 (L) 12.0 - 15.0 g/dL   Patient temperature 102.6 F    Collection site RADIAL, ALLEN'S TEST ACCEPTABLE    Sample type ARTERIAL   Vitamin B12     Status: Abnormal   Collection Time: 02/09/20  3:00 PM  Result Value Ref Range   Vitamin B-12 4,515 (H) 180 - 914 pg/mL    Comment: (NOTE) This assay is not validated for testing neonatal or myeloproliferative syndrome specimens for Vitamin B12 levels. Performed at [facility], [place] [address], [place], [place] [address]    Magnesium     Status: None   Collection Time: 02/09/20  3:00 PM  Result Value Ref Range   Magnesium 2.1 1.7 - 2.4 mg/dL    Comment: Performed at [facility], [place] [address] [place], [place] [address]  Phosphorus     Status: Abnormal   Collection Time: 02/09/20  3:00 PM  Result Value Ref Range   Phosphorus 4.7 (H) 2.5 - 4.6 mg/dL    Comment: Performed at [facility] [address] [place], [place] [address]  VITAMIN D 25 Hydroxy (Vit-D Deficiency, Fractures)     Status: Abnormal   Collection Time: 02/09/20  3:00 PM  Result Value Ref Range   Vit D, 25-Hydroxy 10.52 (L) 30 - 100 ng/mL    Comment: (NOTE) Vitamin D deficiency has been defined by the Institute of [employer]  and an Endocrine Society practice guideline as a level of serum 25-OH  vitamin D less than 20 ng/mL (1,2). The Endocrine Society went on to  further define vitamin D insufficiency as a level between 21 and 29  ng/mL (2). 1. IOM (Institute of Medicine). 2010. Dietary reference intakes for  calcium and D. [place]: The [employer]. 2. Holick MF, Binkley [place], Bischoff-Ferrari HA, et al. Evaluation,  treatment, and prevention of vitamin D deficiency: an Endocrine  Society clinical practice guideline, JCEM. 2011 Jul; 96(7): 1911-30. Performed at [facility], [place] [address] [place], [place] [address]   Glucose, capillary     Status: None   Collection Time: 02/09/20  3:20 PM  Result Value Ref Range   Glucose-Capillary 86 70 - 99 mg/dL    Comment: Glucose reference range applies only to samples taken after fasting for at least 8 hours.  Glucose, capillary     Status: None   Collection Time: 02/09/20  6:41 PM  Result Value Ref Range   Glucose-Capillary 94 70 - 99 mg/dL    Comment: Glucose reference range applies only to samples taken after fasting for at least 8 hours.  Magnesium     Status: None   Collection Time: 02/09/20  6:50 PM  Result Value Ref Range   Magnesium 2.1 1.7 -  2.4 mg/dL    Comment: Performed at [facility], [place] [address] [place],  [address]  Phosphorus     Status: Abnormal   Collection Time: 02/09/20  6:50 PM  Result Value Ref Range  Phosphorus 4.8 (H) 2.5 - 4.6 mg/dL    Comment: Performed at [facility], [place] [address] [place], [place] [address]    DG Abd 1 View  Result Date: 01/31/2020 CLINICAL DATA:  OG tube placement. EXAM: ABDOMEN - 1 VIEW 4:48 p.m. COMPARISON:  11/09/2020 at 12:03 a.m. FINDINGS: OG tube tip is in the left mid abdomen likely in the body of the stomach. Moderate stool in the left side of the colon. No fecal impaction. Bowel gas pattern is normal. IVC filter in place. Surgical clips in the mid abdomen, unchanged. No acute bone abnormality. IMPRESSION: OG tube in good position. Electronically Signed   By: [name] M.D.   On: 01/12/2020 17:09   DG CHEST PORT 1 VIEW  Result Date: 02/05/2020 CLINICAL DATA:  Check endotracheal tube placement EXAM: PORTABLE CHEST 1 VIEW COMPARISON:  01/21/2020 FINDINGS: Cardiac shadow is stable. Gastric catheter is noted extending into the stomach stable from the prior exam. Endotracheal tube has been withdrawn and now lies approximately 2 cm above the carina. Patchy opacities are again identified within both lungs stable from the prior exam. Stable right jugular central line at the cavoatrial junction. No bony abnormality is seen. IMPRESSION: Tubes and lines as described in satisfactory position. Patchy opacities bilaterally stable from the prior exam. Electronically Signed   By: Inez Catalina M.D.   On: 01/19/2020 22:33   DG CHEST PORT 1 VIEW  Result Date: 01/17/2020 CLINICAL DATA:  Encounter for intubation. EXAM: PORTABLE CHEST 1 VIEW COMPARISON:  02/07/2020 FINDINGS: An endotracheal tube is been inserted. The tip is in the proximal right mainstem bronchus and needs to be retracted approximately 4 cm. Central venous catheter tip is above the cavoatrial junction in good  position approximately 3 cm below the carina. This is unchanged. New NG tube tip is below the diaphragm likely in the body of the stomach. New infiltrate in the right upper lobe with increased hazy infiltrate throughout the left lung. No effusions. Heart size and vascularity are normal. No bone abnormality. IMPRESSION: 1. Endotracheal tube tip is in the proximal right mainstem bronchus and needs to be retracted approximately 4 cm. 2. Increasing bilateral pulmonary infiltrates. Critical Value/emergent results were called by telephone at the time of interpretation on 02/01/2020 at 5:05 pm to Shanon Brow, RN , who verbally acknowledged these results. Electronically Signed   By: [name] M.D.   On: 01/27/2020 17:08   DG CHEST PORT 1 VIEW  Result Date: 02/07/2020 CLINICAL DATA:  Central line placement. EXAM: PORTABLE CHEST 1 VIEW COMPARISON:  Radiograph earlier this day, lung bases from abdominal CT earlier today. FINDINGS: Right internal jugular central venous catheter tip projects over the lower SVC. No pneumothorax. The patient is rotated. Cardiomegaly is not significantly changed. There is a retrocardiac hiatal hernia. Patchy bilateral lung opacities, slight suprahilar prominence, unchanged from earlier today. No large pleural effusion. IMPRESSION: 1. Tip of the right internal jugular central venous catheter projects over the lower SVC. No pneumothorax. 2. Stable cardiomegaly. Patchy bilateral lung opacities, slight suprahilar prominence, unchanged from earlier today. Hiatal hernia. Electronically Signed   By: Keith Rake M.D.   On: 02/07/2020 23:16   DG Abd Portable 1V  Result Date: 01/27/2020 CLINICAL DATA:  NG tube placement. EXAM: PORTABLE ABDOMEN - 1 VIEW COMPARISON:  Abdominal CT yesterday. FINDINGS: Tip and side port of the enteric tube below the diaphragm in the stomach. An IVC filter is in place. More detailed evaluation is limited by habitus. IMPRESSION:  Tip and side port of the enteric tube  below the diaphragm in the stomach. Electronically Signed   By: Keith Rake M.D.   On: 01/24/2020 00:16   ECHOCARDIOGRAM COMPLETE  Result Date: 01/27/2020    ECHOCARDIOGRAM REPORT   Patient Name:   AYAT DRENNING Date of Exam: 01/26/2020 Medical Rec #:  [id number]         Height:       65.0 in Accession #:    [id number]        Weight:       500.0 lb Date of Birth:  07-25-1976          BSA:          2.920 m Patient Age:    29 years          BP:           108/66 mmHg Patient Gender: F                 HR:           129 bpm. Exam Location:  Inpatient Procedure: 2D Echo Indications:    Pericardial effusion 423.9 / I31.3  History:        Patient has prior history of Echocardiogram examinations, most                 recent 07/26/2019. Hx DVT/PE with IVC filter, chronic anemia.                 Altered mental status. Hypoxemic resp failure. History of                 tachycardia.  Sonographer:    Darlina Sicilian RDCS Referring Phys: [id number] [place]  1. Left ventricular ejection fraction, by estimation, is 70 to 75%. The left ventricle has hyperdynamic function. The left ventricle has no regional wall motion abnormalities. Indeterminate diastolic filling due to E-A fusion.  2. Right ventricular systolic function is hyperdynamic. The right ventricular size is normal. There is normal pulmonary artery systolic pressure. The estimated right ventricular systolic pressure is 25.8 mmHg.  3. Moderate pericardial effusion. The pericardial effusion is posterior to the left ventricle. There is no evidence of cardiac tamponade.  4. The mitral valve is grossly normal. Mild mitral valve regurgitation. No evidence of mitral stenosis.  5. The aortic valve is tricuspid. Aortic valve regurgitation is not visualized. No aortic stenosis is present.  6. The inferior vena cava is normal in size with greater than 50% respiratory variability, suggesting right atrial pressure of 3 mmHg. Comparison(s): A prior study was  performed on 07/26/2019. No major change. Pericardial effusion was small 07/26/2019 and is now small to moderate. There is no evidence of tamponade. FINDINGS  Left Ventricle: Left ventricular ejection fraction, by estimation, is 70 to 75%. The left ventricle has hyperdynamic function. The left ventricle has no regional wall motion abnormalities. The left ventricular internal cavity size was normal in size. There is no left ventricular hypertrophy. Indeterminate diastolic filling due to E-A fusion. Right Ventricle: The right ventricular size is normal. No increase in right ventricular wall thickness. Right ventricular systolic function is hyperdynamic. There is normal pulmonary artery systolic pressure. The tricuspid regurgitant velocity is 2.79 m/s, and with an assumed right atrial pressure of 3 mmHg, the estimated right ventricular systolic pressure is 52.7 mmHg. Left Atrium: Left atrial size was normal in size. Right Atrium: Right atrial size was normal in size. Pericardium: A moderately sized pericardial  effusion is present. The pericardial effusion is posterior to the left ventricle. There is no evidence of cardiac tamponade. Presence of pericardial fat pad. Mitral Valve: The mitral valve is grossly normal. Mild mitral valve regurgitation. No evidence of mitral valve stenosis. Tricuspid Valve: The tricuspid valve is grossly normal. Tricuspid valve regurgitation is trivial. No evidence of tricuspid stenosis. Aortic Valve: The aortic valve is tricuspid. Aortic valve regurgitation is not visualized. No aortic stenosis is present. Pulmonic Valve: The pulmonic valve was grossly normal. Pulmonic valve regurgitation is not visualized. No evidence of pulmonic stenosis. Aorta: The aortic root is normal in size and structure. Venous: The inferior vena cava is normal in size with greater than 50% respiratory variability, suggesting right atrial pressure of 3 mmHg. IAS/Shunts: The atrial septum is grossly normal.  LEFT  VENTRICLE PLAX 2D LVIDd:         4.40 cm LVIDs:         3.40 cm LV PW:         1.10 cm LV IVS:        1.10 cm LVOT diam:     2.10 cm LV SV:         52 LV SV Index:   18 LVOT Area:     3.46 cm  LV Volumes (MOD) LV vol d, MOD A2C: 176.0 ml LV vol d, MOD A4C: 168.0 ml LV vol s, MOD A2C: 88.7 ml LV vol s, MOD A4C: 101.0 ml LV SV MOD A2C:     87.3 ml LV SV MOD A4C:     168.0 ml LV SV MOD BP:      77.2 ml RIGHT VENTRICLE RV S prime:     24.40 cm/s TAPSE (M-mode): 2.6 cm LEFT ATRIUM             Index       RIGHT ATRIUM           Index LA diam:        2.60 cm 0.89 cm/m  RA Area:     11.10 cm LA Vol (A2C):   51.0 ml 17.47 ml/m RA Volume:   22.10 ml  7.57 ml/m LA Vol (A4C):   26.4 ml 9.04 ml/m LA Biplane Vol: 38.0 ml 13.01 ml/m  AORTIC VALVE LVOT Vmax:   112.00 cm/s LVOT Vmean:  75.700 cm/s LVOT VTI:    0.150 m  AORTA Ao Root diam: 2.90 cm TRICUSPID VALVE TR Peak grad:   31.1 mmHg TR Vmax:        279.00 cm/s  SHUNTS Systemic VTI:  0.15 m Systemic Diam: 2.10 cm Eleonore Chiquito MD Electronically signed by Eleonore Chiquito MD Signature Date/Time: 01/22/2020/12:30:34 PM    Final     Review of Systems  Unable to perform ROS: Intubated   Blood pressure (!) 103/57, pulse (!) 126, temperature 98.8 F (37.1 C), temperature source Axillary, resp. rate (!) 24, height 5\' 5"  (1.651 m), weight (!) 226.8 kg, SpO2 100 %. Physical Exam  Vitals reviewed. Constitutional: She is intubated.  Morbidly obese  HENT:  Head: Normocephalic and atraumatic.  Cardiovascular: Tachycardia present.  Respiratory: She is intubated.  GI:  Surgical wound of pannus and left chronic pannus wound packed with moistened kerlix. Moistened washcloth covering central portion of wound. Skin bridging between surgical wound and left chronic wound is dusky purplish in color.      Assessment/Plan: Agree with General Surgery assessment and plan. We remain available to assist as needed.   Thank you for  the opportunity to consult on this patient.    Threasa Heads, PA-C 02/09/2020, 7:31 PM

## 2020-02-09 NOTE — Progress Notes (Signed)
ANTICOAGULATION CONSULT NOTE - Initial Consult  Pharmacy Consult for Heparin Indication: History of PE and DVT   Allergies  Allergen Reactions  . Aspirin     Due to hx of stomach ulcers  . Coumarin     Patient Measurements: Height: 5\' 5"  (165.1 cm) Weight: (!) 500 lb (226.8 kg) IBW/kg (Calculated) : 57 Heparin Dosing Weight: 118kg  Vital Signs: Temp: 102.9 F (39.4 C) (03/29 1125) Temp Source: Axillary (03/29 1125) BP: 102/49 (03/29 1200) Pulse Rate: 132 (03/29 1230)  Labs: Recent Labs     0000 02/01/2020 0802 02/07/2020 1407 02/05/2020 1537 01/23/2020 1920 01/26/2020 2120 01/18/2020 2243 02/09/20 0101 02/09/20 0113 02/09/20 0658 02/09/20 0658 02/09/20 1153 02/09/20 1213  HGB  --   --   --   --    < > 7.0*   < > 6.9*   < > 7.7*   < > 7.6* 8.8*  HCT  --   --   --   --    < > 23.9*   < > 23.3*   < > 25.7*  --  25.5* 26.0*  PLT   < >  --   --   --   --  458*   < > 481*  --  456*  --  453*  --   APTT  --   --   --  34  --   --   --   --   --   --   --   --   --   LABPROT  --  19.2*  --   --   --   --   --   --   --   --   --   --   --   INR  --  1.6*  --   --   --   --   --   --   --   --   --   --   --   HEPARINUNFRC  --   --   --  0.49  --   --   --   --   --   --   --   --   --   CREATININE  --  1.09*  --   --   --  1.23*  --  1.29*  --   --   --   --   --   TROPONINIHS  --   --  15  --   --   --   --   --   --   --   --   --   --    < > = values in this interval not displayed.    Estimated Creatinine Clearance: 109.7 mL/min (A) (by C-G formula based on SCr of 1.29 mg/dL (H)).  Assessment: Patient with history of DVT/PE with IVC filter placement currently admitted with general malaise, presenting septic with leukocytosis. Her apixaban is being held with general surgery consulting for potential surgical intervention with her wounds.   Had surgery yesterday; planning to return tomorrow  Goal of Therapy:  Heparin level 0.3-0.7 units/ml Monitor platelets by  anticoagulation protocol: Yes   Plan:  Resume heparin 2000 units/hr - due to short time will not be able to get labs (also likely not needed) Off at 0000 for surgery  Barth Kirks, PharmD, BCPS, [facility] Pharmacist [phone]  Please check AMION for all [place] numbers  02/09/2020 1:03 PM

## 2020-02-10 ENCOUNTER — Inpatient Hospital Stay (HOSPITAL_COMMUNITY): Payer: BC Managed Care – PPO

## 2020-02-10 ENCOUNTER — Encounter (HOSPITAL_COMMUNITY): Admission: EM | Disposition: E | Payer: Self-pay | Source: Home / Self Care | Attending: Emergency Medicine

## 2020-02-10 ENCOUNTER — Inpatient Hospital Stay (HOSPITAL_COMMUNITY): Payer: BC Managed Care – PPO | Admitting: Certified Registered Nurse Anesthetist

## 2020-02-10 DIAGNOSIS — R6521 Severe sepsis with septic shock: Secondary | ICD-10-CM | POA: Diagnosis not present

## 2020-02-10 DIAGNOSIS — J9601 Acute respiratory failure with hypoxia: Secondary | ICD-10-CM | POA: Diagnosis not present

## 2020-02-10 DIAGNOSIS — R579 Shock, unspecified: Secondary | ICD-10-CM | POA: Diagnosis not present

## 2020-02-10 DIAGNOSIS — A419 Sepsis, unspecified organism: Secondary | ICD-10-CM | POA: Diagnosis not present

## 2020-02-10 DIAGNOSIS — E43 Unspecified severe protein-calorie malnutrition: Secondary | ICD-10-CM | POA: Insufficient documentation

## 2020-02-10 HISTORY — PX: WOUND DEBRIDEMENT: SHX247

## 2020-02-10 LAB — POCT I-STAT 7, (LYTES, BLD GAS, ICA,H+H)
Acid-base deficit: 10 mmol/L — ABNORMAL HIGH (ref 0.0–2.0)
Acid-base deficit: 10 mmol/L — ABNORMAL HIGH (ref 0.0–2.0)
Bicarbonate: 17.7 mmol/L — ABNORMAL LOW (ref 20.0–28.0)
Bicarbonate: 17.2 mmol/L — ABNORMAL LOW (ref 20.0–28.0)
Calcium, Ion: 1.02 mmol/L — ABNORMAL LOW (ref 1.15–1.40)
Calcium, Ion: 1 mmol/L — ABNORMAL LOW (ref 1.15–1.40)
HCT: 24 % — ABNORMAL LOW (ref 36.0–46.0)
HCT: 35 % — ABNORMAL LOW (ref 36.0–46.0)
Hemoglobin: 8.2 g/dL — ABNORMAL LOW (ref 12.0–15.0)
Hemoglobin: 11.9 g/dL — ABNORMAL LOW (ref 12.0–15.0)
O2 Saturation: 100 %
O2 Saturation: 100 %
Patient temperature: 98
Potassium: 3.9 mmol/L (ref 3.5–5.1)
Potassium: 4 mmol/L (ref 3.5–5.1)
Sodium: 133 mmol/L — ABNORMAL LOW (ref 135–145)
Sodium: 133 mmol/L — ABNORMAL LOW (ref 135–145)
TCO2: 19 mmol/L — ABNORMAL LOW (ref 22–32)
TCO2: 18 mmol/L — ABNORMAL LOW (ref 22–32)
pCO2 arterial: 45.8 mmHg (ref 32.0–48.0)
pCO2 arterial: 39.3 mmHg (ref 32.0–48.0)
pH, Arterial: 7.194 — CL (ref 7.350–7.450)
pH, Arterial: 7.247 — ABNORMAL LOW (ref 7.350–7.450)
pO2, Arterial: 268 mmHg — ABNORMAL HIGH (ref 83.0–108.0)
pO2, Arterial: 267 mmHg — ABNORMAL HIGH (ref 83.0–108.0)

## 2020-02-10 LAB — CBC
HCT: 22.8 % — ABNORMAL LOW (ref 36.0–46.0)
Hemoglobin: 6.9 g/dL — CL (ref 12.0–15.0)
MCH: 29.4 pg (ref 26.0–34.0)
MCHC: 30.3 g/dL (ref 30.0–36.0)
MCV: 97 fL (ref 80.0–100.0)
Platelets: 424 10*3/uL — ABNORMAL HIGH (ref 150–400)
RBC: 2.35 MIL/uL — ABNORMAL LOW (ref 3.87–5.11)
RDW: 19.9 % — ABNORMAL HIGH (ref 11.5–15.5)
WBC: 67.9 10*3/uL (ref 4.0–10.5)
nRBC: 1.5 % — ABNORMAL HIGH (ref 0.0–0.2)

## 2020-02-10 LAB — CBC WITH DIFFERENTIAL/PLATELET
Abs Immature Granulocytes: 13.07 10*3/uL — ABNORMAL HIGH (ref 0.00–0.07)
Basophils Absolute: 0 10*3/uL (ref 0.0–0.1)
Basophils Relative: 0 %
Eosinophils Absolute: 0 10*3/uL (ref 0.0–0.5)
Eosinophils Relative: 0 %
HCT: 24 % — ABNORMAL LOW (ref 36.0–46.0)
Hemoglobin: 7.1 g/dL — ABNORMAL LOW (ref 12.0–15.0)
Immature Granulocytes: 22 %
Lymphocytes Relative: 7 %
Lymphs Abs: 3.9 10*3/uL (ref 0.7–4.0)
MCH: 29 pg (ref 26.0–34.0)
MCHC: 29.6 g/dL — ABNORMAL LOW (ref 30.0–36.0)
MCV: 98 fL (ref 80.0–100.0)
Monocytes Absolute: 1.4 10*3/uL — ABNORMAL HIGH (ref 0.1–1.0)
Monocytes Relative: 2 %
Neutro Abs: 41.7 10*3/uL — ABNORMAL HIGH (ref 1.7–7.7)
Neutrophils Relative %: 69 %
Platelets: 398 10*3/uL (ref 150–400)
RBC: 2.45 MIL/uL — ABNORMAL LOW (ref 3.87–5.11)
RDW: 19.7 % — ABNORMAL HIGH (ref 11.5–15.5)
WBC: 60.1 10*3/uL (ref 4.0–10.5)
nRBC: 0.7 % — ABNORMAL HIGH (ref 0.0–0.2)

## 2020-02-10 LAB — GLUCOSE, CAPILLARY
Glucose-Capillary: 100 mg/dL — ABNORMAL HIGH (ref 70–99)
Glucose-Capillary: 104 mg/dL — ABNORMAL HIGH (ref 70–99)
Glucose-Capillary: 121 mg/dL — ABNORMAL HIGH (ref 70–99)
Glucose-Capillary: 130 mg/dL — ABNORMAL HIGH (ref 70–99)
Glucose-Capillary: 89 mg/dL (ref 70–99)
Glucose-Capillary: 96 mg/dL (ref 70–99)

## 2020-02-10 LAB — COMPREHENSIVE METABOLIC PANEL
ALT: 27 U/L (ref 0–44)
AST: 28 U/L (ref 15–41)
Albumin: <1 g/dL — ABNORMAL LOW (ref 3.5–5.0)
Alkaline Phosphatase: 124 U/L (ref 38–126)
Anion gap: 18 — ABNORMAL HIGH (ref 5–15)
BUN: 37 mg/dL — ABNORMAL HIGH (ref 6–20)
CO2: 16 mmol/L — ABNORMAL LOW (ref 22–32)
Calcium: 7 mg/dL — ABNORMAL LOW (ref 8.9–10.3)
Chloride: 98 mmol/L (ref 98–111)
Creatinine, Ser: 1.43 mg/dL — ABNORMAL HIGH (ref 0.44–1.00)
GFR calc Af Amer: 51 mL/min — ABNORMAL LOW (ref >60)
GFR calc non Af Amer: 44 mL/min — ABNORMAL LOW (ref >60)
Glucose, Bld: 141 mg/dL — ABNORMAL HIGH (ref 70–99)
Potassium: 3.6 mmol/L (ref 3.5–5.1)
Sodium: 132 mmol/L — ABNORMAL LOW (ref 135–145)
Total Bilirubin: 1 mg/dL (ref 0.3–1.2)
Total Protein: 5.3 g/dL — ABNORMAL LOW (ref 6.5–8.1)

## 2020-02-10 LAB — PREPARE RBC (CROSSMATCH)

## 2020-02-10 LAB — TRIGLYCERIDES: Triglycerides: 201 mg/dL — ABNORMAL HIGH (ref <150)

## 2020-02-10 LAB — PHOSPHORUS
Phosphorus: 4.6 mg/dL (ref 2.5–4.6)
Phosphorus: 5.1 mg/dL — ABNORMAL HIGH (ref 2.5–4.6)

## 2020-02-10 LAB — SURGICAL PATHOLOGY

## 2020-02-10 LAB — MAGNESIUM
Magnesium: 2.1 mg/dL (ref 1.7–2.4)
Magnesium: 2 mg/dL (ref 1.7–2.4)

## 2020-02-10 SURGERY — IRRIGATION AND DEBRIDEMENT, WOUND, ABDOMEN, WITH CLOSURE
Anesthesia: General | Site: Abdomen

## 2020-02-10 MED ORDER — HEPARIN (PORCINE) 25000 UT/250ML-% IV SOLN
2000.0000 [IU]/h | INTRAVENOUS | Status: DC
  Administered 2020-02-10 (×2): 2000 [IU]/h via INTRAVENOUS
  Filled 2020-02-10: qty 250

## 2020-02-10 MED ORDER — PHENYLEPHRINE HCL-NACL 10-0.9 MG/250ML-% IV SOLN
0.0000 ug/min | INTRAVENOUS | Status: DC
  Administered 2020-02-10: 22:00:00 20 ug/min via INTRAVENOUS
  Administered 2020-02-11 (×2): 70 ug/min via INTRAVENOUS
  Administered 2020-02-11: 120 ug/min via INTRAVENOUS
  Filled 2020-02-10 (×5): qty 250

## 2020-02-10 MED ORDER — LACTATED RINGERS IV SOLN: INTRAVENOUS | Status: DC | PRN

## 2020-02-10 MED ORDER — SODIUM BICARBONATE 8.4 % IV SOLN
100.0000 meq | Freq: Once | INTRAVENOUS | Status: AC
  Administered 2020-02-10: 100 meq via INTRAVENOUS
  Filled 2020-02-10: qty 50

## 2020-02-10 MED ORDER — SODIUM CHLORIDE 0.9% IV SOLUTION: Freq: Once | INTRAVENOUS | Status: AC

## 2020-02-10 MED ORDER — SODIUM BICARBONATE 8.4 % IV SOLN
INTRAVENOUS | Status: AC
  Filled 2020-02-10: qty 50

## 2020-02-10 MED ORDER — 0.9 % SODIUM CHLORIDE (POUR BTL) OPTIME
TOPICAL | Status: DC | PRN
  Administered 2020-02-10: 11:00:00 1000 mL

## 2020-02-10 SURGICAL SUPPLY — 34 items
BAG DECANTER FOR FLEXI CONT (MISCELLANEOUS) IMPLANT
BLADE CLIPPER SURG (BLADE) IMPLANT
BNDG GAUZE ELAST 4 BULKY (GAUZE/BANDAGES/DRESSINGS) ×9 IMPLANT
CANISTER SUCT 3000ML PPV (MISCELLANEOUS) ×3 IMPLANT
COVER SURGICAL LIGHT HANDLE (MISCELLANEOUS) ×3 IMPLANT
COVER WAND RF STERILE (DRAPES) IMPLANT
DRAPE INCISE IOBAN 66X45 STRL (DRAPES) IMPLANT
DRAPE LAPAROSCOPIC ABDOMINAL (DRAPES) IMPLANT
DRAPE UNIVERSAL PACK (DRAPES) ×3 IMPLANT
ELECT REM PT RETURN 9FT ADLT (ELECTROSURGICAL) ×3
ELECTRODE REM PT RTRN 9FT ADLT (ELECTROSURGICAL) ×1 IMPLANT
GAUZE SPONGE 4X4 12PLY STRL (GAUZE/BANDAGES/DRESSINGS) IMPLANT
GAUZE SPONGE 4X4 12PLY STRL LF (GAUZE/BANDAGES/DRESSINGS) ×6 IMPLANT
GLOVE BIO SURGEON STRL SZ8 (GLOVE) ×3 IMPLANT
GLOVE BIOGEL PI IND STRL 8 (GLOVE) ×1 IMPLANT
GLOVE BIOGEL PI INDICATOR 8 (GLOVE) ×2
GOWN STRL REUS W/ TWL LRG LVL3 (GOWN DISPOSABLE) ×1 IMPLANT
GOWN STRL REUS W/TWL LRG LVL3 (GOWN DISPOSABLE) ×2
KIT BASIN OR (CUSTOM PROCEDURE TRAY) ×3 IMPLANT
KIT TURNOVER KIT B (KITS) ×3 IMPLANT
NS IRRIG 1000ML POUR BTL (IV SOLUTION) ×3 IMPLANT
PACK GENERAL/GYN (CUSTOM PROCEDURE TRAY) ×3 IMPLANT
PAD ABD 8X10 STRL (GAUZE/BANDAGES/DRESSINGS) ×9 IMPLANT
PAD ARMBOARD 7.5X6 YLW CONV (MISCELLANEOUS) ×6 IMPLANT
PENCIL SMOKE EVACUATOR (MISCELLANEOUS) ×3 IMPLANT
SPONGE LAP 18X18 RF (DISPOSABLE) IMPLANT
STAPLER VISISTAT 35W (STAPLE) IMPLANT
SUT ETHILON 2 0 FS 18 (SUTURE) ×3 IMPLANT
SUT VIC AB 2-0 SH 18 (SUTURE) ×3 IMPLANT
TAPE CLOTH SURG 6X10 WHT LF (GAUZE/BANDAGES/DRESSINGS) ×3 IMPLANT
TAPE MEASURE VINYL STERILE (MISCELLANEOUS) ×3 IMPLANT
TOWEL GREEN STERILE (TOWEL DISPOSABLE) ×3 IMPLANT
TOWEL GREEN STERILE FF (TOWEL DISPOSABLE) ×3 IMPLANT
UNDERPAD 30X30 (UNDERPADS AND DIAPERS) ×6 IMPLANT

## 2020-02-10 NOTE — Progress Notes (Signed)
[facility] Progress Note Patient Name: Nadine Ryle DOB: August 07, 1976 MRN: [id number]   Date of Service  01/12/2020  HPI/Events of Note  Hypotension - BP = 92/43 with MAP = 58.  Last pH = 7.24. Patient is already on a NaHCO3 IV infusion at 125 mL/hour. Also on Norepinephrine and Vasopressin IV infusions.  eICU Interventions  Will order: 1. Monitor CVP now and Q 4 hours. 2. Phenylephrine IV infusion. Titrate to MAP >= 65. 3. NaHCO3 100 meq IV now. 4. Repeat ABG at 12 midnight.      Intervention Category Major Interventions: Acid-Base disturbance - evaluation and management;Respiratory failure - evaluation and management  Elba Schaber Cornelia Copa 02/07/2020, 9:02 PM

## 2020-02-10 NOTE — Progress Notes (Signed)
[facility] Progress Note Patient Name: Megan Ortiz DOB: 01-19-1976 MRN: [id number]   Date of Service  02/02/2020  HPI/Events of Note  Anemia - Hgb = 6.9. CVP = 6.   eICU Interventions  Will order: 1. Transfuse 1 unit PRBC now.      Intervention Category Major Interventions: Other:  Chasta Deshpande Cornelia Copa 01/22/2020, 10:03 PM

## 2020-02-10 NOTE — Progress Notes (Signed)
Pt w/ desat to 70's, evaluated sat probe function and sats remain low with new appliance and good waveform. With O2 bolus sats improved and back to 90s with titration of Fio2 to 100%. Dr. Lamonte Sakai updated via msg and await orders. RT notified.

## 2020-02-10 NOTE — Op Note (Signed)
Megan Ortiz 01/30/2020 - 01/13/2020   Pre-op Diagnosis: NECROTIZING FASCITIS OF THE ABDOMINAL WALL     Post-op Diagnosis: SAME  Procedure(s): SHARP EXCISIONAL DEBRIDEMENT ABDOMINAL WALL 20 CM X 25 CM X 10 CM AND 10 CM X 10 CM X 10 CM SKIN AND UNDERLYING SOFT TISSUE  Surgeon(s): Coralie Keens, MD  Anesthesia: General  Staff:  Circulator: Ronney Lion, RN; Small, Benjaman Lobe, RN Scrub Person: Paulette Blanch, RN  Estimated Blood Loss: less than 100 mL               Indications: This is a 44 year old female with a BMI of 82 who was taken to the operating room emergently on 3/28 for necrotizing fasciitis of the abdominal wall.  She underwent extensive debridement at that time.  She now presents back to the operating room for further wound exploration and debridement.  She remains clinically ill on the ventilator.  Findings: The patient was found to have extensive necrosis and fasciitis with dishwater fluid in the left lateral abdominal wall necessitating sharp debridement of 2 large areas of abdominal wall skin and underlying soft tissue.  Procedure: The patient was brought to the operating room straight from the intensive care unit still intubated.  She is placed upon the operating room table and general anesthesia was induced.  We removed all packing from her abdominal wall pannus.  She had necrotic skin on the left side of her abdomen.  I sharply excised a 20 cm x 25 cm x 10 cm area of skin and subcutaneous tissue with a scalpel.  I then explored more lateral and found further necrotic underlying tissue.  I had to sharply excise another 10 cm x 10 cm x 10 cm area of skin and underlying soft tissue.  She had extensive tracking of dishwater type of fluid into the planes of the abdominal wall.  I opened up the spleens bluntly.  A lot of the fluid was then suctioned out of the tissue planes.  At this point, I achieved hemostasis with the cautery as well as several 2-0 Vicryl sutures  and a 2-0 nylon suture at the skin edge.  We then repacked the wound with at least 6 Kerlix gauze which was soaked in saline and then cover these with ABDs.  The patient was then taken today in guarded condition still intubated back to the intensive care unit.  All counts were correct at the end of the procedure.  Excisional debridement:  1.  Tool used for debridement (curette, scapel, etc.)  SCAPLE  2.  Frequency of surgical debridement.   Every 48 hours  3.  Measurement of total devitalized tissue (wound surface) before and after surgical debridement.   2 areas.  See above not for measurements  4.  Area and depth of devitalized tissue removed from wound.  Abdominal wall, 10 cm deep  5.  Blood loss and description of tissue removed.  100cc.  Necrotic skin and subcutaneous tissue  6.  Progress note or procedure note with a detailed description of the procedure. See above  7.  Evidence of the progress of the wound's response to treatment.  A.  Current wound volume. 105 cm x 50 cm x 10 cm  B.  Presence of infection.  yes  C.  Presence of non viable tissue.  No longer  D.  Other material in the wound that is expected to inhibit healing.  none  Coralie Keens   Date: 01/12/2020  Time: 11:06 AM

## 2020-02-10 NOTE — Transfer of Care (Signed)
Immediate Anesthesia Transfer of Care Note  Patient: Megan Ortiz  Procedure(s) Performed: DEBRIDEMENT ABDOMINAL WALL (N/A Abdomen)  Patient Location: ICU  Anesthesia Type:General  Level of Consciousness: sedated  Airway & Oxygen Therapy: Patient remains intubated per anesthesia plan and Patient placed on Ventilator (see vital sign flow sheet for setting)  Post-op Assessment: Report given to RN and Post -op Vital signs reviewed and stable  Post vital signs: Reviewed and stable  Last Vitals:  Vitals Value Taken Time  BP 108/41 01/18/2020 1139  Temp    Pulse 128 01/12/2020 1142  Resp 21 02/05/2020 1142  SpO2 91 % 02/09/2020 1142  Vitals shown include unvalidated device data.  Last Pain:  Vitals:   02/02/2020 0700  TempSrc: Axillary  PainSc:       Patients Stated Pain Goal: 4 (33/35/45 6256)  Complications: No apparent anesthesia complications

## 2020-02-10 NOTE — Anesthesia Preprocedure Evaluation (Addendum)
Anesthesia Evaluation  Patient identified by MRN, date of birth, ID band Patient unresponsive  General Assessment Comment:Pt is intubated and sedated.  Reviewed: Allergy & Precautions, H&P , NPO status , Patient's Chart, lab work & pertinent test results  Airway Mallampati: Intubated       Dental   Pulmonary asthma , former smoker,    + rhonchi        Cardiovascular hypertension, + DVT   Rhythm:Regular Rate:Tachycardia  H/o DVT   Neuro/Psych  Headaches, PSYCHIATRIC DISORDERS Anxiety    GI/Hepatic PUD, GERD  ,  Endo/Other  Morbid obesity  Renal/GU      Musculoskeletal   Abdominal   Peds  Hematology  (+) Blood dyscrasia, anemia ,   Anesthesia Other Findings   Reproductive/Obstetrics                            Anesthesia Physical  Anesthesia Plan  ASA: IV  Anesthesia Plan: General   Post-op Pain Management:    Induction: Intravenous  PONV Risk Score and Plan: 3 and Ondansetron, Midazolam, Dexamethasone and Treatment may vary due to age or medical condition  Airway Management Planned: Oral ETT  Additional Equipment:   Intra-op Plan:   Post-operative Plan: Post-operative intubation/ventilation  Informed Consent: I have reviewed the patients History and Physical, chart, labs and discussed the procedure including the risks, benefits and alternatives for the proposed anesthesia with the patient or authorized representative who has indicated his/her understanding and acceptance.       Plan Discussed with: CRNA, Anesthesiologist and Surgeon  Anesthesia Plan Comments:        Anesthesia Quick Evaluation

## 2020-02-10 NOTE — Anesthesia Postprocedure Evaluation (Signed)
Anesthesia Post Note  Patient: Megan Ortiz  Procedure(s) Performed: DEBRIDEMENT ABDOMINAL WALL (N/A Abdomen)     Patient location during evaluation: SICU Anesthesia Type: General Level of consciousness: sedated Pain management: pain level controlled Vital Signs Assessment: post-procedure vital signs reviewed and stable Respiratory status: patient remains intubated per anesthesia plan Cardiovascular status: stable Postop Assessment: no apparent nausea or vomiting Anesthetic complications: no    Last Vitals:  Vitals:   01/16/2020 1145 02/07/2020 1200  BP: (!) 101/48 (!) 111/44  Pulse: (!) 127 (!) 127  Resp: 20 (!) 24  Temp:    SpO2: 92% 95%    Last Pain:  Vitals:   02/06/2020 1100  TempSrc: Axillary  PainSc:                  Lavora Brisbon DANIEL

## 2020-02-10 NOTE — Progress Notes (Signed)
Pharmacy Antibiotic Note  Megan Ortiz is a 44 y.o. female admitted on 01/22/2020 with sepsis due to bilateral thighs and abdominal wound infection. S/p OR with likely return today  Remains on pressors  Renal fx slightly worse  Plan: Continue vanc 1750 mg q24h Continue cefepime F/U LOT, lvls prn - consider thur Continue clinda as still on pressors  Height: 5\' 5"  (165.1 cm) Weight: (!) 395 lb (179.2 kg) IBW/kg (Calculated) : 57  Temp (24hrs), Avg:101.4 F (38.6 C), Min:98.8 F (37.1 C), Max:103.2 F (39.6 C)  Recent Labs  Lab 02/05/20 2348 02/06/20 0322 02/06/20 0751 02/07/20 0228 02/07/20 0903 02/07/20 1147 02/07/20 1915 01/22/2020 0348 02/10/2020 0802 01/23/2020 1029 01/29/2020 2120 02/09/20 0101 02/09/20 0510 02/09/20 0658 02/09/20 1153 01/15/2020 0335  WBC  --    < >  --  37.9*  --   --   --    < >  --   --  54.7* 64.0*  --  49.5* 45.0* 60.1*  CREATININE 0.94   < >  --  0.78  --   --  1.13*  --  1.09*  --  1.23* 1.29*  --   --   --   --   LATICACIDVEN  --   --   --   --    < > 3.6*  --   --  3.4* 3.1*  --  2.6* 2.5*  --   --   --   VANCOTROUGH  --   --  35*  --   --   --   --   --   --   --   --   --   --   --   --   --   VANCOPEAK 56*  --   --   --   --   --   --   --   --   --   --   --   --   --   --   --    < > = values in this interval not displayed.    Estimated Creatinine Clearance: 93 mL/min (A) (by C-G formula based on SCr of 1.29 mg/dL (H)).    Allergies  Allergen Reactions  . Aspirin     Due to hx of stomach ulcers  . Coumarin    Barth Kirks, PharmD, BCPS, BCCCP Clinical Pharmacist [phone]  Please check AMION for all [place] numbers  01/15/2020 9:08 AM

## 2020-02-10 NOTE — Procedures (Addendum)
Arterial Catheter Insertion Procedure Note Megan Ortiz [id number] [birth year]  Procedure: Insertion of Arterial Catheter  Indications: Blood pressure monitoring  Procedure Details Consent: Unable to obtain consent because of emergent medical necessity. Time Out: Verified patient identification, verified procedure, site/side was marked, verified correct patient position, special equipment/implants available, medications/allergies/relevent history reviewed, required imaging and test results available.  Performed  Maximum sterile technique was used including antiseptics, gloves, gown, hand hygiene and mask. Skin prep: Chlorhexidine; local anesthetic administered 20 gauge catheter was inserted into left radial artery using the Seldinger technique. ULTRASOUND GUIDANCE USED: YES Evaluation Blood flow good; BP tracing good. Complications: No apparent complications.   Candee Furbish 01/18/2020

## 2020-02-10 NOTE — Progress Notes (Signed)
Pt in the OR at this time. RT will assess pt when they arrive back to the unit.

## 2020-02-10 NOTE — Progress Notes (Signed)
OR staff here to transport patient to OR for debridement, report given to CRNA.

## 2020-02-10 NOTE — Progress Notes (Signed)
NAME:  Megan Ortiz, MRN:  [id number], DOB:  1976-04-11, LOS: 7 ADMISSION DATE:  01/17/2020, CONSULTATION DATE:  02/04/20 REFERRING MD:  [facility], CHIEF COMPLAINT:  weakness  Brief History   Patient with history of calciphylaxis presented to [facility] emergency room with complaints of general malaise, treated for sepsis of unk source.   History of present illness   44 year old morbidly obese female with history of calciphylaxis diagnosed several months ago at [facility], hx DVT/PE, chronic anemia, presents with weakness and general malaise.  On presentation patient was found to have a leukocytosis of 50,000 with a hemoglobin of 5.6, platelet count of 397, lactic acid 8.0.  Patient's creatinine is 0.85 calcium of 8.8 with a phosphorus of 5.3. Relative hypotension  In summary: Hypotension, WBC, anemia, lactic acidosis.    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 Transferred to ICU  Surgery consulted.   3/28: Intubated for worsening AMS.  OR for debridement of pannus cellulitis/gas on CT abd.  Debridement of nec fas of pannus.  500cc EBL, 2 U PRBC.  Oxygenation issues.   Plan for return to OR 3/29.  Consults:  Wound care  Procedures:  NA  Significant Diagnostic Tests:  CT abd/pelv:  IMPRESSION: 1. Significant portions of the study are nearly nondiagnostic secondary to lack of IV contrast and the patient's body habitus. 2. There are pockets of subcutaneous gas with surrounding edema and overlying skin thickening involving the patient's pannus. These findings are concerning for a necrotizing infectious process in the appropriate clinical setting. There is no well-formed  drainable fluid collection. 3. Massive hepatomegaly with hepatic steatosis. 4. Diffuse bilateral ground-glass airspace opacities concerning for an atypical infectious process. 5. Large hiatal hernia. 6. Borderline splenomegaly. 7. Cardiomegaly with a small to moderate-sized pericardial effusion. 8. IVC filter in place with significant caval penetration.   Micro Data:  Blood 3/23 >>  Urine 3/24 >>  Blood cx 3/27  Antimicrobials:  Zosyn 3/23  >>3/26 Vancomycin 3/23 >>   Cefepime 3/27--> Flagyl 3/27 --> Clindamycin 3/27 -->     Interim history/subjective:  Persistent tachycardia.  Norepinephrine currently 21, vasopressin running, bicarb drip running, fentanyl 50 Currently on PSV 10 and tolerating  Objective   Blood pressure (!) 122/48, pulse (!) 128, temperature (!) 101.7 F (38.7 C), temperature source Axillary, resp. rate (!) 27, height 5\' 5"  (1.651 m), weight (!) 179.2 kg, SpO2 100 %. CVP:  [6 mmHg-12 mmHg] 8 mmHg  Vent Mode: PSV;CPAP FiO2 (%):  [40 %] 40 % Set Rate:  [28 bmp] 28 bmp Vt Set:  [450 mL] 450 mL PEEP:  [5 cmH20] 5 cmH20 Pressure Support:  [10 cmH20] 10 cmH20   Intake/Output Summary (Last 24 hours) at 02/01/2020 9562 Last data filed at 02/05/2020 0700 Gross per 24 hour  Intake 5774.18 ml  Output 1005 ml  Net 4769.18 ml   Filed Weights   01/15/2020 1649 01/27/2020 0600  Weight: (!) 226.8 kg (!) 179.2 kg    Examination: General: Ill-appearing morbidly obese woman, intubated HENT: ET tube in place, edentulous, no secretions Lungs: Very distant, clear, decreased at both bases Cardiovascular: Regular, tachycardic 120s, no murmur Abdomen: Obese, nondistended, wound packed without a VAC in place.  No bowel sounds heard Extremities:  1+ pretibial pitting edema Skin: Decubitus wounds right buttock, left upper thigh, right hip, left buttock/hip unstageable with eschar.   Open surgical wound on abdominal pannus, wound is dressed, see surgical note for photos.    Neuro: Opens eyes to stimulation then quickly back to sleep.  Did not follow commands  Resolved Hospital Problem list   NA  Assessment & Plan:  Nec fasciitis of pannus: fever, hypotension, altered mental status, septic shock. Metabolic acidosis.  Septic shock Planning to return to the OR this morning for more debridement, possible closure.  Appreciate surgery and plastic surgery management Found leukocytosis, following Continue broad-spectrum antibiotics, cefepime, vancomycin, clindamycin Continue bicarbonate infusion for now, follow ABG, BMP  Anemia:  Hx microcytic chronic anemia, now acute 2/2 BL during surgery.  Follow CBC Restart systemic anticoagulation for her history DVT/PE when okay with surgery, no further procedures  Pericardial effusion. Small to moderate.  No tamponade on echo.    Follow  Hypoglycemia:  Continue D10 as ordered, wean as able depending on serial CBG  Toxic metabolic encephalopathy: Likely 2/2 worsening sepsis.   Continue to address underlying sepsis Minimize sedating medications as possible  Acute respiratory failure with hypoxemia, due to septic shock.  Potential contribution atelectasis, cardiogenic edema Continue current ventilator support PRVC 8 cc/kg No plans for extubation until underlying sepsis improved, pressor needs improve, no further need for OR/debridement  Hepatic steatosis, massive hepatomegaly Repeat LFT 1  Calciphylaxis with the wounds as described above:  Dx after 05/2020 after she had been started on warfarin.  Warfarin necrosis vs more likely calciphylaxis per dermatology (biopsy done in 2020). Has been following with dermatology as OP.  Appreciate wound care consultation: Dakin's moistened gauze to lower extremity wounds.  Silvadene with ABD/gauze to eschar. Bariatric bed Pain control  Protein cal malnutrition; Tube feeding as tolerated  Chronic pain secondary to calciphylaxis:  Typically on tramadol and  oxycodone. Currently fentanyl, minimize as able   Hx DVT and PE, with IVC filter PE and IVC filter done >10 years ago, per my conversation with spouse.   She was not on chronic ac until 7/21 when diagnosed with RLE dvt.   Plan for lifelong AC per her spouse.   Of note, by CT non con, "IVC filter in place with significant caval penetration".  Will need to address once acute illness resolved.  Heparin for anticoagulation, plan to hold in preparation for procedures   Best practice:  Diet: npo for now, will start tube feeds after surgery.   Pain/Anxiety/Delirium protocol (if indicated): As needed VAP protocol (if indicated): N/A DVT prophylaxis: Heparin infusion GI prophylaxis: N/A Glucose control: We will monitor Mobility: Bedrest Code Status: Full Family Communication: Discussed her status and plans with pt's husband by phone 3/30.  Disposition: ICU  Labs   CBC: Recent Labs  Lab 01/15/2020 1756 02/04/20 0552 02/07/20 0228 02/07/20 2314 02/09/2020 2120 01/30/2020 2243 02/09/20 0101 02/09/20 0113 02/09/20 7829 02/09/20 1153 02/09/20 1213 02/09/20 2342 01/16/2020 0335  WBC 50.1*   < > 37.9*   < > 54.7*  --  64.0*  --  49.5* 45.0*  --   --  60.1*  NEUTROABS 43.7*  --  32.8*  --  39.7*  --   --   --  34.0*  --   --   --  41.7*  HGB 5.6*   < > 8.0*   < > 7.0*   < > 6.9*   < > 7.7* 7.6* 8.8* 8.2* 7.1*  HCT 20.6*   < >  27.2*   < > 23.9*   < > 23.3*   < > 25.7* 25.5* 26.0* 24.0* 24.0*  MCV 99.0   < > 94.4   < > 98.0  --  97.5  --  96.3 97.0  --   --  98.0  PLT 397   < > 428*   < > 458*  --  481*  --  456* 453*  --   --  398   < > = values in this interval not displayed.    Basic Metabolic Panel: Recent Labs  Lab 02/05/20 0252 02/05/20 2348 02/07/20 0228 02/07/20 0228 02/07/20 1915 02/07/20 2314 01/15/2020 0348 01/20/2020 0449 01/30/2020 0802 02/02/2020 1818 01/22/2020 2120 02/01/2020 2120 02/09/2020 2243 02/09/20 0101 02/09/20 0113 02/09/20 1213 02/09/20 1500 02/09/20 1850  02/09/20 2342 02/05/2020 0335  NA 131*   < > 131*   < > 131*   < >  --    < > 131*   < > 132*   < > 133* 132* 132* 133*  --   --  133*  --   K 4.1   < > 4.0   < > 4.1   < >  --    < > 4.0   < > 3.9   < > 4.0 4.1 4.0 4.0  --   --  3.8  --   CL 99   < > 98  --  99  --   --   --  100  --  101  --   --  101  --   --   --   --   --   --   CO2 19*   < > 20*  --  18*  --   --   --  16*  --  20*  --   --  19*  --   --   --   --   --   --   GLUCOSE 73   < > 86  --  83  --   --   --  83  --  124*  --   --  141*  --   --   --   --   --   --   BUN 7   < > 18  --  25*  --   --   --  29*  --  33*  --   --  32*  --   --   --   --   --   --   CREATININE 0.84   < > 0.78  --  1.13*  --   --   --  1.09*  --  1.23*  --   --  1.29*  --   --   --   --   --   --   CALCIUM 8.4*   < > 8.8*  --  8.6*  --   --   --  8.2*  --  7.7*  --   --  7.4*  --   --   --   --   --   --   MG 1.6*   < >  --   --   --   --  2.2  --   --   --  2.3  --   --   --   --   --  2.1 2.1  --  2.1  PHOS 4.2  --   --   --   --   --  4.7*  --   --   --   --   --   --   --   --   --  4.7* 4.8*  --  4.6   < > = values in this interval not displayed.   GFR: Estimated Creatinine Clearance: 93 mL/min (A) (by C-G formula based on SCr of 1.29 mg/dL (H)). Recent Labs  Lab 02/04/20 1921 02/05/20 0252 02/07/2020 0802 01/16/2020 1029 01/20/2020 2120 02/09/20 0101 02/09/20 0510 02/09/20 0658 02/09/20 1153 02/09/2020 0335  PROCALCITON 14.97  --   --   --   --   --   --   --   --   --   WBC 32.6*   < >  --   --    < > 64.0*  --  49.5* 45.0* 60.1*  LATICACIDVEN 6.0*   < > 3.4* 3.1*  --  2.6* 2.5*  --   --   --    < > = values in this interval not displayed.    Liver Function Tests: Recent Labs  Lab 01/26/2020 1756 02/04/20 1921 02/07/20 1915  AST 25 21 40  ALT 13 13 24   ALKPHOS 200* 191* 168*  BILITOT 0.3 0.8 1.0  PROT 6.4* 5.7* 6.3*  ALBUMIN 1.3* <1.0* 1.0*   No results for input(s): LIPASE, AMYLASE in the last 168 hours. Recent Labs  Lab  02/07/20 1915  AMMONIA 51*    ABG    Component Value Date/Time   PHART 7.289 (L) 02/09/2020 2342   PCO2ART 38.4 02/09/2020 2342   PO2ART 88.0 02/09/2020 2342   HCO3 18.2 (L) 02/09/2020 2342   TCO2 19 (L) 02/09/2020 2342   ACIDBASEDEF 7.0 (H) 02/09/2020 2342   O2SAT 95.0 02/09/2020 2342     Coagulation Profile: Recent Labs  Lab 01/27/2020 1839 02/04/20 1921 02/06/2020 0802  INR 1.4* 1.4* 1.6*    Cardiac Enzymes: No results for input(s): CKTOTAL, CKMB, CKMBINDEX, TROPONINI in the last 168 hours.  HbA1C: Hgb A1c MFr Bld  Date/Time Value Ref Range Status  02/04/2020 07:21 PM 5.1 4.8 - 5.6 % Final    Comment:    (NOTE) Pre diabetes:          5.7%-6.4% Diabetes:              >6.4% Glycemic control for   <7.0% adults with diabetes   02/26/2017 04:52 AM 5.4 4.8 - 5.6 % Final    Comment:    (NOTE)         Pre-diabetes: 5.7 - 6.4         Diabetes: >6.4         Glycemic control for adults with diabetes: <7.0     CBG: Recent Labs  Lab 02/09/20 1841 02/09/20 2101 02/09/20 2317 01/14/2020 0332 01/30/2020 0735  GLUCAP 94 126* 135* 130* 121*     Critical care time: 32 minutes     Baltazar Apo, MD, PhD 01/24/2020, 9:19 AM [facility] [phone] or if no answer [phone]

## 2020-02-10 NOTE — Progress Notes (Signed)
CRITICAL VALUE ALERT  Critical Value:  WBC 60.1  Date & Time Notied:  02/09/2020 0425

## 2020-02-10 NOTE — Progress Notes (Signed)
Left radial A-line inserted by Dr. Tamala Julian. ABG results given to Dr. Tamala Julian who is at pt bedside. Verbal order received to increase pt RR to 32 and VT to 530ml. Verbal order received to obtain ABG at 2000. RT will continue to monitor.

## 2020-02-10 NOTE — Anesthesia Postprocedure Evaluation (Signed)
Anesthesia Post Note  Patient: Megan Ortiz  Procedure(s) Performed: EXCISIONAL DEBRIDEMENT OF SKIN, SOFT TISSUE, MUSCLE OF NECROTIZING ABDOMINAL WALL INFECTION. (N/A Abdomen)     Patient location during evaluation: SICU Anesthesia Type: General Level of consciousness: sedated Pain management: pain level controlled Vital Signs Assessment: post-procedure vital signs reviewed and stable Respiratory status: patient remains intubated per anesthesia plan Cardiovascular status: stable Postop Assessment: no apparent nausea or vomiting Anesthetic complications: no    Last Vitals:  Vitals:   01/23/2020 0648 01/15/2020 0700  BP: (!) 108/46 (!) 103/51  Pulse: (!) 123 (!) 124  Resp: (!) 26 (!) 24  Temp:    SpO2: 97% 96%    Last Pain:  Vitals:   01/23/2020 0406  TempSrc: Axillary  PainSc:                  Luciana Cammarata S

## 2020-02-10 NOTE — Progress Notes (Signed)
ANTICOAGULATION CONSULT NOTE - Initial Consult  Pharmacy Consult for Heparin Indication: History of PE and DVT   Allergies  Allergen Reactions  . Aspirin     Due to hx of stomach ulcers  . Coumarin     Patient Measurements: Height: 5\' 5"  (165.1 cm) Weight: (!) 395 lb (179.2 kg) IBW/kg (Calculated) : 57 Heparin Dosing Weight: 118kg  Vital Signs: Temp: 101.8 F (38.8 C) (03/30 1100) Temp Source: Axillary (03/30 1100) BP: 111/44 (03/30 1200) Pulse Rate: 127 (03/30 1200)  Labs: Recent Labs     0000 01/31/2020 0802 02/01/2020 1407 02/02/2020 1537 01/22/2020 1818 01/28/2020 2120 02/02/2020 2243 02/09/20 0101 02/09/20 0113 02/09/20 0658 02/09/20 0658 02/09/20 1153 02/09/20 1153 02/09/20 1213 02/09/20 1213 02/09/20 2342 01/24/2020 0335 01/15/2020 0930  HGB  --   --   --   --    < > 7.0*   < > 6.9*   < > 7.7*   < > 7.6*   < > 8.8*   < > 8.2* 7.1*  --   HCT  --   --   --   --    < > 23.9*   < > 23.3*   < > 25.7*   < > 25.5*   < > 26.0*  --  24.0* 24.0*  --   PLT   < >  --   --   --   --  458*   < > 481*   < > 456*  --  453*  --   --   --   --  398  --   APTT  --   --   --  34  --   --   --   --   --   --   --   --   --   --   --   --   --   --   LABPROT  --  19.2*  --   --   --   --   --   --   --   --   --   --   --   --   --   --   --   --   INR  --  1.6*  --   --   --   --   --   --   --   --   --   --   --   --   --   --   --   --   HEPARINUNFRC  --   --   --  0.49  --   --   --   --   --   --   --   --   --   --   --   --   --   --   CREATININE   < > 1.09*  --   --   --  1.23*  --  1.29*  --   --   --   --   --   --   --   --   --  1.43*  TROPONINIHS  --   --  15  --   --   --   --   --   --   --   --   --   --   --   --   --   --   --    < > = values  in this interval not displayed.    Estimated Creatinine Clearance: 83.9 mL/min (A) (by C-G formula based on SCr of 1.43 mg/dL (H)).  Assessment: Patient with history of DVT/PE with IVC filter placement currently admitted with  general malaise, presenting septic with leukocytosis. Her apixaban is being held with general surgery consulting for potential surgical intervention with her wounds.   Patient had further surgery this am - ok to resume heparin in 6 hours per surgeon  Goal of Therapy:  Heparin level 0.3-0.7 units/ml Monitor platelets by anticoagulation protocol: Yes   Plan:  Resume heparin 2000 units/hr - @ 1800 Initial hep lvl 0000 Daily hep lvl cbc  Barth Kirks, PharmD, BCPS, BCCCP Clinical Pharmacist [phone]  Please check AMION for all [place] numbers  01/12/2020 12:52 PM

## 2020-02-11 ENCOUNTER — Encounter: Payer: Self-pay | Admitting: *Deleted

## 2020-02-11 DIAGNOSIS — J9601 Acute respiratory failure with hypoxia: Secondary | ICD-10-CM | POA: Diagnosis not present

## 2020-02-11 DIAGNOSIS — A419 Sepsis, unspecified organism: Secondary | ICD-10-CM | POA: Diagnosis not present

## 2020-02-11 DIAGNOSIS — M726 Necrotizing fasciitis: Secondary | ICD-10-CM | POA: Diagnosis not present

## 2020-02-11 DIAGNOSIS — R6521 Severe sepsis with septic shock: Secondary | ICD-10-CM | POA: Diagnosis not present

## 2020-02-11 LAB — TYPE AND SCREEN
ABO/RH(D): O POS
Antibody Screen: NEGATIVE
Unit division: 0
Unit division: 0
Unit division: 0

## 2020-02-11 LAB — COMPREHENSIVE METABOLIC PANEL
ALT: 31 U/L (ref 0–44)
AST: 39 U/L (ref 15–41)
Albumin: <1 g/dL — ABNORMAL LOW (ref 3.5–5.0)
Alkaline Phosphatase: 114 U/L (ref 38–126)
Anion gap: 20 — ABNORMAL HIGH (ref 5–15)
BUN: 41 mg/dL — ABNORMAL HIGH (ref 6–20)
CO2: 16 mmol/L — ABNORMAL LOW (ref 22–32)
Calcium: 6.6 mg/dL — ABNORMAL LOW (ref 8.9–10.3)
Chloride: 97 mmol/L — ABNORMAL LOW (ref 98–111)
Creatinine, Ser: 1.83 mg/dL — ABNORMAL HIGH (ref 0.44–1.00)
GFR calc Af Amer: 38 mL/min — ABNORMAL LOW (ref >60)
GFR calc non Af Amer: 33 mL/min — ABNORMAL LOW (ref >60)
Glucose, Bld: 103 mg/dL — ABNORMAL HIGH (ref 70–99)
Potassium: 3.7 mmol/L (ref 3.5–5.1)
Sodium: 133 mmol/L — ABNORMAL LOW (ref 135–145)
Total Bilirubin: 0.8 mg/dL (ref 0.3–1.2)
Total Protein: 5.1 g/dL — ABNORMAL LOW (ref 6.5–8.1)

## 2020-02-11 LAB — CBC WITH DIFFERENTIAL/PLATELET
Abs Immature Granulocytes: 14.56 10*3/uL — ABNORMAL HIGH (ref 0.00–0.07)
Basophils Absolute: 0.1 10*3/uL (ref 0.0–0.1)
Basophils Relative: 0 %
Eosinophils Absolute: 0.1 10*3/uL (ref 0.0–0.5)
Eosinophils Relative: 0 %
HCT: 25.4 % — ABNORMAL LOW (ref 36.0–46.0)
Hemoglobin: 7.4 g/dL — ABNORMAL LOW (ref 12.0–15.0)
Immature Granulocytes: 22 %
Lymphocytes Relative: 6 %
Lymphs Abs: 4 10*3/uL (ref 0.7–4.0)
MCH: 28.7 pg (ref 26.0–34.0)
MCHC: 29.1 g/dL — ABNORMAL LOW (ref 30.0–36.0)
MCV: 98.4 fL (ref 80.0–100.0)
Monocytes Absolute: 1.2 10*3/uL — ABNORMAL HIGH (ref 0.1–1.0)
Monocytes Relative: 2 %
Neutro Abs: 46.8 10*3/uL — ABNORMAL HIGH (ref 1.7–7.7)
Neutrophils Relative %: 70 %
Platelets: 371 10*3/uL (ref 150–400)
RBC: 2.58 MIL/uL — ABNORMAL LOW (ref 3.87–5.11)
RDW: 20.3 % — ABNORMAL HIGH (ref 11.5–15.5)
WBC: 66.7 10*3/uL (ref 4.0–10.5)
nRBC: 2.2 % — ABNORMAL HIGH (ref 0.0–0.2)

## 2020-02-11 LAB — BPAM RBC
Blood Product Expiration Date: 202105012359
Blood Product Expiration Date: 202105012359
Blood Product Expiration Date: 202105022359
ISSUE DATE / TIME: 202103281909
ISSUE DATE / TIME: 202103290335
ISSUE DATE / TIME: 202103302220
Unit Type and Rh: 5100
Unit Type and Rh: 5100
Unit Type and Rh: 5100

## 2020-02-11 LAB — POCT I-STAT 7, (LYTES, BLD GAS, ICA,H+H)
Acid-base deficit: 10 mmol/L — ABNORMAL HIGH (ref 0.0–2.0)
Acid-base deficit: 9 mmol/L — ABNORMAL HIGH (ref 0.0–2.0)
Acid-base deficit: 19 mmol/L — ABNORMAL HIGH (ref 0.0–2.0)
Bicarbonate: 17.4 mmol/L — ABNORMAL LOW (ref 20.0–28.0)
Bicarbonate: 18.2 mmol/L — ABNORMAL LOW (ref 20.0–28.0)
Bicarbonate: 11 mmol/L — ABNORMAL LOW (ref 20.0–28.0)
Calcium, Ion: 0.95 mmol/L — ABNORMAL LOW (ref 1.15–1.40)
Calcium, Ion: 0.93 mmol/L — ABNORMAL LOW (ref 1.15–1.40)
Calcium, Ion: 0.89 mmol/L — CL (ref 1.15–1.40)
HCT: 25 % — ABNORMAL LOW (ref 36.0–46.0)
HCT: 23 % — ABNORMAL LOW (ref 36.0–46.0)
HCT: 22 % — ABNORMAL LOW (ref 36.0–46.0)
Hemoglobin: 8.5 g/dL — ABNORMAL LOW (ref 12.0–15.0)
Hemoglobin: 7.8 g/dL — ABNORMAL LOW (ref 12.0–15.0)
Hemoglobin: 7.5 g/dL — ABNORMAL LOW (ref 12.0–15.0)
O2 Saturation: 93 %
O2 Saturation: 92 %
O2 Saturation: 94 %
Patient temperature: 101
Patient temperature: 103
Patient temperature: 103.3
Potassium: 4 mmol/L (ref 3.5–5.1)
Potassium: 3.7 mmol/L (ref 3.5–5.1)
Potassium: 4.3 mmol/L (ref 3.5–5.1)
Sodium: 134 mmol/L — ABNORMAL LOW (ref 135–145)
Sodium: 136 mmol/L (ref 135–145)
Sodium: 135 mmol/L (ref 135–145)
TCO2: 19 mmol/L — ABNORMAL LOW (ref 22–32)
TCO2: 19 mmol/L — ABNORMAL LOW (ref 22–32)
TCO2: 12 mmol/L — ABNORMAL LOW (ref 22–32)
pCO2 arterial: 43.9 mmHg (ref 32.0–48.0)
pCO2 arterial: 47.6 mmHg (ref 32.0–48.0)
pCO2 arterial: 52.7 mmHg — ABNORMAL HIGH (ref 32.0–48.0)
pH, Arterial: 7.212 — ABNORMAL LOW (ref 7.350–7.450)
pH, Arterial: 7.203 — ABNORMAL LOW (ref 7.350–7.450)
pH, Arterial: 6.947 — CL (ref 7.350–7.450)
pO2, Arterial: 87 mmHg (ref 83.0–108.0)
pO2, Arterial: 87 mmHg (ref 83.0–108.0)
pO2, Arterial: 128 mmHg — ABNORMAL HIGH (ref 83.0–108.0)

## 2020-02-11 LAB — TRIGLYCERIDES: Triglycerides: 303 mg/dL — ABNORMAL HIGH (ref <150)

## 2020-02-11 LAB — HEPARIN LEVEL (UNFRACTIONATED)
Heparin Unfractionated: 0.24 IU/mL — ABNORMAL LOW (ref 0.30–0.70)
Heparin Unfractionated: 0.32 IU/mL (ref 0.30–0.70)
Heparin Unfractionated: 0.44 IU/mL (ref 0.30–0.70)

## 2020-02-11 LAB — GLUCOSE, CAPILLARY
Glucose-Capillary: 97 mg/dL (ref 70–99)
Glucose-Capillary: 93 mg/dL (ref 70–99)
Glucose-Capillary: 100 mg/dL — ABNORMAL HIGH (ref 70–99)
Glucose-Capillary: 71 mg/dL (ref 70–99)
Glucose-Capillary: 65 mg/dL — ABNORMAL LOW (ref 70–99)
Glucose-Capillary: 59 mg/dL — ABNORMAL LOW (ref 70–99)
Glucose-Capillary: 69 mg/dL — ABNORMAL LOW (ref 70–99)

## 2020-02-11 MED ORDER — DEXTROSE 50 % IV SOLN
INTRAVENOUS | Status: AC
  Filled 2020-02-11: qty 50

## 2020-02-11 MED ORDER — INSULIN ASPART 100 UNIT/ML ~~LOC~~ SOLN: 0.0000 [IU] | SUBCUTANEOUS | Status: DC

## 2020-02-11 MED ORDER — SODIUM BICARBONATE 8.4 % IV SOLN
200.0000 meq | Freq: Once | INTRAVENOUS | Status: AC
  Administered 2020-02-11: 200 meq via INTRAVENOUS

## 2020-02-11 MED ORDER — HEPARIN (PORCINE) 25000 UT/250ML-% IV SOLN
2200.0000 [IU]/h | INTRAVENOUS | Status: DC
  Administered 2020-02-11 (×2): 2200 [IU]/h via INTRAVENOUS
  Filled 2020-02-11 (×2): qty 250

## 2020-02-11 MED ORDER — MORPHINE SULFATE (PF) 2 MG/ML IV SOLN
2.0000 mg | INTRAVENOUS | Status: DC | PRN
  Administered 2020-02-11: 2 mg via INTRAVENOUS
  Filled 2020-02-11: qty 1

## 2020-02-11 MED ORDER — GLYCOPYRROLATE 0.2 MG/ML IJ SOLN: 0.2000 mg | INTRAMUSCULAR | Status: DC | PRN

## 2020-02-11 MED ORDER — LINEZOLID 600 MG/300ML IV SOLN
600.0000 mg | Freq: Two times a day (BID) | INTRAVENOUS | Status: DC
  Administered 2020-02-11 (×2): 600 mg via INTRAVENOUS
  Filled 2020-02-11 (×2): qty 300

## 2020-02-11 MED ORDER — DEXTROSE 5 % IV SOLN: INTRAVENOUS | Status: DC

## 2020-02-11 MED ORDER — GLYCOPYRROLATE 1 MG PO TABS: 1.0000 mg | ORAL_TABLET | ORAL | Status: DC | PRN

## 2020-02-11 MED ORDER — SODIUM BICARBONATE 8.4 % IV SOLN
INTRAVENOUS | Status: AC
  Filled 2020-02-11: qty 100

## 2020-02-11 MED ORDER — PHENYLEPHRINE CONCENTRATED 100MG/250ML (0.4 MG/ML) INFUSION SIMPLE
0.0000 ug/min | INTRAVENOUS | Status: DC
  Administered 2020-02-11: 120 ug/min via INTRAVENOUS
  Administered 2020-02-11: 380 ug/min via INTRAVENOUS
  Administered 2020-02-11: 400 ug/min via INTRAVENOUS
  Filled 2020-02-11 (×4): qty 250

## 2020-02-11 MED ORDER — DIPHENHYDRAMINE HCL 50 MG/ML IJ SOLN: 25.0000 mg | INTRAMUSCULAR | Status: DC | PRN

## 2020-02-11 MED ORDER — ALBUMIN HUMAN 25 % IV SOLN
50.0000 g | Freq: Once | INTRAVENOUS | Status: AC
  Administered 2020-02-11: 50 g via INTRAVENOUS
  Filled 2020-02-11: qty 100

## 2020-02-11 MED ORDER — ACETAMINOPHEN 325 MG PO TABS: 650.0000 mg | ORAL_TABLET | Freq: Four times a day (QID) | ORAL | Status: DC | PRN

## 2020-02-11 MED ORDER — SODIUM CHLORIDE 0.9 % IV BOLUS
1000.0000 mL | Freq: Once | INTRAVENOUS | Status: AC
  Administered 2020-02-11: 1000 mL via INTRAVENOUS

## 2020-02-11 MED ORDER — POLYVINYL ALCOHOL 1.4 % OP SOLN: 1.0000 [drp] | Freq: Four times a day (QID) | OPHTHALMIC | Status: DC | PRN

## 2020-02-11 MED ORDER — DEXTROSE 50 % IV SOLN
12.5000 g | INTRAVENOUS | Status: AC
  Administered 2020-02-11: 12.5 g via INTRAVENOUS

## 2020-02-11 MED ORDER — DEXTROSE 50 % IV SOLN
INTRAVENOUS | Status: AC
  Administered 2020-02-11: 50 mL
  Filled 2020-02-11: qty 50

## 2020-02-11 MED ORDER — ACETAMINOPHEN 650 MG RE SUPP: 650.0000 mg | Freq: Four times a day (QID) | RECTAL | Status: DC | PRN

## 2020-02-11 MED ORDER — SODIUM BICARBONATE 8.4 % IV SOLN
100.0000 meq | Freq: Once | INTRAVENOUS | Status: AC
  Administered 2020-02-11: 06:00:00 100 meq via INTRAVENOUS
  Filled 2020-02-11: qty 100

## 2020-02-11 MED ORDER — GLYCOPYRROLATE 0.2 MG/ML IJ SOLN
0.2000 mg | INTRAMUSCULAR | Status: DC | PRN
  Administered 2020-02-11: 0.2 mg via INTRAVENOUS
  Filled 2020-02-11: qty 1

## 2020-02-11 MED ORDER — NOREPINEPHRINE 16 MG/250ML-% IV SOLN
0.0000 ug/min | INTRAVENOUS | Status: DC
  Administered 2020-02-11: 55 ug/min via INTRAVENOUS

## 2020-02-12 LAB — ZINC: Zinc: 29 ug/dL — ABNORMAL LOW (ref 44–115)

## 2020-02-12 LAB — CULTURE, BLOOD (ROUTINE X 2)
Culture: NO GROWTH
Culture: NO GROWTH
Special Requests: ADEQUATE
Special Requests: ADEQUATE

## 2020-02-12 LAB — COPPER, SERUM: Copper: 96 ug/dL (ref 80–158)

## 2020-02-12 NOTE — Progress Notes (Signed)
[facility] Progress Note Patient Name: Megan Ortiz DOB: 06-04-1976 MRN: [id number]   Date of Service  2020-02-18  HPI/Events of Note  Hypotension - BP = 91/30. ABG with pH = 6.947/52.7/128.0  eICU Interventions  Will order: 1. NaHCO3 200 meq IV now. 2. Increase D5 NaHCO3 IV infusion to 150 mL/hour.  3 Repeat ABG at 12 midnight.      Intervention Category Major Interventions: Hypotension - evaluation and management;Acid-Base disturbance - evaluation and management  Demir Titsworth [place] 02-18-20, 9:44 PM

## 2020-02-12 NOTE — Progress Notes (Signed)
ANTICOAGULATION CONSULT NOTE - Initial Consult  Pharmacy Consult for Heparin Indication: History of PE and DVT   Allergies  Allergen Reactions  . Aspirin     Due to hx of stomach ulcers  . Coumarin     Patient Measurements: Height: 5\' 5"  (165.1 cm) Weight: (!) 395 lb (179.2 kg) IBW/kg (Calculated) : 57 Heparin Dosing Weight: 118kg  Vital Signs: Temp: 103.1 F (39.5 C) (03/31 1125) Temp Source: Oral (03/31 1125) BP: 87/31 (03/31 1143) Pulse Rate: 141 (03/31 1143)  Labs: Recent Labs    02/07/2020 1407 02/05/2020 1537 02/05/2020 1537 01/29/2020 1818 01/23/2020 2120 02/04/2020 2243 02/09/20 0101 02/09/20 0113 01/12/2020 0335 01/25/2020 0930 01/22/2020 1840 01/24/2020 2058 01/21/2020 2058 Mar 10, 2020 0010 2020/03/10 0414 March 10, 2020 0414 2020-03-10 0443 March 10, 2020 0744 03/10/2020 1020  HGB  --   --   --    < > 7.0*   < > 6.9*   < > 7.1*  --    < > 6.9*   < >  --  8.5*   < > 7.4* 7.8*  --   HCT  --   --   --    < > 23.9*   < > 23.3*   < > 24.0*  --    < > 22.8*   < >  --  25.0*  --  25.4* 23.0*  --   PLT  --   --   --   --  458*   < > 481*   < > 398  --   --  424*  --   --   --   --  371  --   --   APTT  --  34  --   --   --   --   --   --   --   --   --   --   --   --   --   --   --   --   --   HEPARINUNFRC  --  0.49   < >  --   --   --   --   --   --   --   --   --   --  0.24*  --   --  0.32  --  0.44  CREATININE  --   --   --   --  1.23*  --  1.29*  --   --  1.43*  --   --   --   --   --   --   --   --   --   TROPONINIHS 15  --   --   --   --   --   --   --   --   --   --   --   --   --   --   --   --   --   --    < > = values in this interval not displayed.    Estimated Creatinine Clearance: 83.9 mL/min (A) (by C-G formula based on SCr of 1.43 mg/dL (H)).  Assessment: Patient with history of DVT/PE with IVC filter placement currently admitted with general malaise, presenting septic with leukocytosis. Her apixaban is being held with general surgery consulting for potential surgical intervention  with her wounds.   Patient to have re-exploration in OR tomorrow  Hep lvl therapeutic x 2  Cbc low but stable - no concerns for bleeding  Goal of Therapy:  Heparin level 0.3-0.7 units/ml Monitor platelets by anticoagulation protocol: Yes   Plan:  Continue heparin 2200 units/hr  Off at 0000 for surgery thur F/u restart after  Barth Kirks, PharmD, BCPS, BCCCP Clinical Pharmacist [phone]  Please check AMION for all [place] numbers  03/06/20 12:27 PM

## 2020-02-12 NOTE — Progress Notes (Signed)
eLink Physician-Brief Progress Note Patient Name: Megan Ortiz DOB: 08-27-1976 MRN: [id number]   Date of Service  Mar 02, 2020  HPI/Events of Note  Hypotension - BP = 91/30 on Norepinephrine IV infusion at ceiling, Phenylephrine IV infusion at ceiling and Vasopressin at shock dose.    eICU Interventions  Will order: 1. ABG STAT. 2. Increase ceiling on Norepinephrine IV infusion to 60 mcg/min.      Intervention Category Major Interventions: Hypotension - evaluation and management  Lysle Dingwall Mar 02, 2020, 9:24 PM

## 2020-02-12 NOTE — Progress Notes (Signed)
NAME:  Megan Ortiz, MRN:  [id number], DOB:  [birth year], LOS: 8 ADMISSION DATE:  01/14/2020, CONSULTATION DATE:  02/04/20 REFERRING MD:  [facility], CHIEF COMPLAINT:  weakness  Brief History   Patient with history of calciphylaxis presented to [facility] emergency room with complaints of general malaise, treated for sepsis of unk source.   History of present illness   44 year old morbidly obese female with history of calciphylaxis diagnosed several months ago at [facility], hx DVT/PE, chronic anemia, presents with weakness and general malaise.  On presentation patient was found to have a leukocytosis of 50,000 with a hemoglobin of 5.6, platelet count of 397, lactic acid 8.0.  Patient's creatinine is 0.85 calcium of 8.8 with a phosphorus of 5.3. Relative hypotension   Past Medical History  Nonuremic calciphylaxis without renal failure Microcytic anemia History of PE  and DVT status post IVC placement on Eliquis Deep chronic ulcer secondary to calciphylaxis Morbid obesity BMI 83 HTN  Significant Hospital Events   Admission 02/04/2020 3/27 Still tachycardic, intermittently lethargic, more hypotensive. Transferred to ICU  Surgery consulted.  3/28: Intubated for worsening AMS.  OR for debridement of pannus cellulitis/gas on CT abd.  Debridement of nec fas of pannus.  500cc EBL, 2 U PRBC.  Oxygenation issues.   Plan for return to OR 3/29.  3/30 to OR again for debridement of abdominal wall. Post-op developed worsening shock and renal failure. On multiple pressors.   Consults:  Wound care  Procedures:  NA  Significant Diagnostic Tests:  CT abd/pelv: Significant portions of the study are nearly nondiagnostic secondary to lack of IV contrast and the patient's body habitus. There are pockets of subcutaneous gas with surrounding edema and overlying skin thickening involving the patient's pannus. These findings are concerning for a necrotizing infectious process in the appropriate  clinical setting. There is no well-formed drainable fluid collection. Massive hepatomegaly with hepatic steatosis. Diffuse bilateral ground-glass airspace opacities concerning for an atypical infectious process. Large hiatal hernia. Borderline splenomegaly. Cardiomegaly with a small to moderate-sized pericardial effusion. IVC filtr in place with significant caval penetration.   Micro Data:  Blood 3/23 >> neg Urine 3/24 >> neg Blood cx 3/27 >>>  3/28 wound > proteus mirabilis, e.coli (R to ampicillin, cefazolin, unasyn) ( S to cefepim, zosyn)  Antimicrobials:  Zosyn 3/23 - > 3/26 Vancomycin 3/23 >> 3/31 Flagyl 3/27 --> 3/31 Clindamycin 3/27 -->  3/31  Cefepime 3/27 > Linezolid 3/31 >    Interim history/subjective:  Progressive shock and respiratory failure after debridement yesterday.  Objective   Blood pressure (!) 122/38, pulse (!) 141, temperature (!) 103 F (39.4 C), temperature source Axillary, resp. rate (!) 32, height 5\' 5"  (1.651 m), weight (!) 179.2 kg, SpO2 97 %. CVP:  [7 mmHg-13 mmHg] 13 mmHg  Vent Mode: PRVC FiO2 (%):  [40 %-100 %] 60 % Set Rate:  [28 bmp-32 bmp] 32 bmp Vt Set:  [450 mL-500 mL] 500 mL PEEP:  [5 cmH20-10 cmH20] 10 cmH20 Pressure Support:  [5 cmH20-10 cmH20] 5 cmH20 Plateau Pressure:  [25 cmH20] 25 cmH20   Intake/Output Summary (Last 24 hours) at February 27, 2020 2585 Last data filed at 02/27/20 0200 Gross per 24 hour  Intake 5069.57 ml  Output 860 ml  Net 4209.57 ml   Filed Weights   01/23/2020 1649 01/15/2020 0600  Weight: (!) 226.8 kg (!) 179.2 kg    Examination:  General: Morbidly obese female on vent HENT: [place]/AT, PERRL, no JVD Lungs: Coarse bilateral breath sounds Cardiovascular:  Tachy, regular, no MRG Abdomen: Obese, soft. Wound VAC in place with surgical dressing CDI  Extremities: 1+ pretibial pitting edema Skin: Decubitus wounds right buttock, left upper thigh, right hip, left buttock/hip unstageable with eschar.  Not assessed today. Open  surgical wound on abdominal pannus, wound is dressed, see surgical note for photos.  Neuro: Sedated.   Resolved Hospital Problem list   NA  Assessment & Plan:   Necrotizing fasciitis of pannus: fever, hypotension, altered mental status, septic shock. S/p surgical debridement 3/28 and 3/30. Septic shock: profound leukocytosis - Appreciate surgery and plastic surgery management - Continue broad-spectrum antibiotics, cefepime, linezolid - Continue bicarbonate infusion for now - Continue norepinephrine, vasopressin, phenylephrine  - Trial albumin 50g  Acute respiratory failure with hypoxemia, due to septic shock.  Potential contribution atelectasis, cardiogenic edema. - Requiring high PEEP/FiO2 - Follow ABG, unable to meet ventilatory needs even with settings maximized.  - Not a candidate for SBT  Anemia: Hx microcytic chronic anemia, now acute 2/2 BL during surgery.  - Follow CBC  Pericardial effusion. Small to moderate.  No tamponade on echo.    - Follow  Hypoglycemia:  - Continue D10 as ordered, wean as able depending on serial CBG  Toxic metabolic encephalopathy: Likely 2/2 worsening sepsis.   - Continue to address underlying sepsis - Minimize sedating medications as possible  Hepatic steatosis, massive hepatomegaly - Follow LFT  Calciphylaxis with the wounds as described above:  Dx after 05/2020 after she had been started on warfarin.  Warfarin necrosis vs more likely calciphylaxis per dermatology (biopsy done in 2020). - Has been following with dermatology as OP.  - Surgery following inpatient. - Bariatric bed - Pain control  Protein cal malnutrition; - Tube feeding as tolerated  Chronic pain secondary to calciphylaxis:  -Typically on tramadol and oxycodone -Currently fentanyl, minimize as able  Hx DVT and PE, with IVC filter PE and IVC filter done > 10 years ago, per my conversation with spouse.   She was not on chronic ac until 7/21 when diagnosed with RLE dvt.    - Plan for lifelong AC per her spouse.   - Of note, by CT non con, "IVC filter in place with significant caval penetration".  Will need to address once acute illness resolved.  - Heparin for anticoagulation, plan to hold PRN in preparation for procedures   Best practice:  Diet: npo for now, will start tube feeds after surgery.   Pain/Anxiety/Delirium protocol (if indicated): As needed VAP protocol (if indicated): N/A DVT prophylaxis: Heparin infusion GI prophylaxis: N/A Glucose control: We will monitor Mobility: Bedrest Code Status: Full Family Communication: Updated husband. He is upset to hear she is worsening. He asked for the odds she survives. I told him I cannot give him a percentage, but I feel it is much more likely she does not survive the admission, than it is she does survive. I have brought up the subject of DNR with him. He is considering this.  Disposition: ICU  Labs   CBC: Recent Labs  Lab 02/07/20 0228 02/07/20 2314 01/21/2020 2120 01/19/2020 2243 02/09/20 2355 02/09/20 7322 02/09/20 1153 02/09/20 1213 02/05/2020 0335 02/02/2020 1840 01/30/2020 2050 01/22/2020 2058 03-04-2020 0414 Mar 04, 2020 0443 2020/03/04 0744  WBC 37.9*   < > 54.7*   < > 49.5*  --  45.0*  --  60.1*  --   --  67.9*  --  66.7*  --   NEUTROABS 32.8*  --  39.7*  --  34.0*  --   --   --  41.7*  --   --   --   --  46.8*  --   HGB 8.0*   < > 7.0*   < > 7.7*   < > 7.6*   < > 7.1*   < > 11.9* 6.9* 8.5* 7.4* 7.8*  HCT 27.2*   < > 23.9*   < > 25.7*   < > 25.5*   < > 24.0*   < > 35.0* 22.8* 25.0* 25.4* 23.0*  MCV 94.4   < > 98.0   < > 96.3  --  97.0  --  98.0  --   --  97.0  --  98.4  --   PLT 428*   < > 458*   < > 456*  --  453*  --  398  --   --  424*  --  371  --    < > = values in this interval not displayed.    Basic Metabolic Panel: Recent Labs  Lab 02/07/20 1915 02/07/20 2314 01/24/2020 0348 01/31/2020 0449 01/26/2020 0802 01/23/2020 1818 01/29/2020 2120 01/14/2020 2243 02/09/20 0101 02/09/20 0113 02/09/20  1500 02/09/20 1850 02/09/20 2342 01/24/2020 0335 02/04/2020 0930 01/24/2020 1840 01/20/2020 2050 01/14/2020 2058 2020/03/05 0414 March 05, 2020 0744  NA 131*   < >  --    < > 131*   < > 132*   < > 132*   < >  --   --    < >  --  132* 133* 133*  --  134* 136  K 4.1   < >  --    < > 4.0   < > 3.9   < > 4.1   < >  --   --    < >  --  3.6 3.9 4.0  --  4.0 3.7  CL 99  --   --   --  100  --  101  --  101  --   --   --   --   --  98  --   --   --   --   --   CO2 18*  --   --   --  16*  --  20*  --  19*  --   --   --   --   --  16*  --   --   --   --   --   GLUCOSE 83  --   --   --  83  --  124*  --  141*  --   --   --   --   --  141*  --   --   --   --   --   BUN 25*  --   --   --  29*  --  33*  --  32*  --   --   --   --   --  37*  --   --   --   --   --   CREATININE 1.13*  --   --   --  1.09*  --  1.23*  --  1.29*  --   --   --   --   --  1.43*  --   --   --   --   --   CALCIUM 8.6*  --   --   --  8.2*  --  7.7*  --  7.4*  --   --   --   --   --  7.0*  --   --   --   --   --   MG  --   --  2.2   < >  --   --  2.3  --   --   --  2.1 2.1  --  2.1  --   --   --  2.0  --   --   PHOS  --   --  4.7*  --   --   --   --   --   --   --  4.7* 4.8*  --  4.6  --   --   --  5.1*  --   --    < > = values in this interval not displayed.   GFR: Estimated Creatinine Clearance: 83.9 mL/min (A) (by C-G formula based on SCr of 1.43 mg/dL (H)). Recent Labs  Lab 02/04/20 1921 02/05/20 0252 01/16/2020 0802 01/26/2020 1029 01/16/2020 2120 02/09/20 0101 02/09/20 0510 02/09/20 0658 02/09/20 1153 01/23/2020 0335 01/16/2020 2058 02/22/2020 0443  PROCALCITON 14.97  --   --   --   --   --   --   --   --   --   --   --   WBC 32.6*   < >  --   --    < > 64.0*  --    < > 45.0* 60.1* 67.9* 66.7*  LATICACIDVEN 6.0*   < > 3.4* 3.1*  --  2.6* 2.5*  --   --   --   --   --    < > = values in this interval not displayed.    Liver Function Tests: Recent Labs  Lab 02/04/20 1921 02/07/20 1915 01/31/2020 0930  AST 21 40 28  ALT 13 24 27    ALKPHOS 191* 168* 124  BILITOT 0.8 1.0 1.0  PROT 5.7* 6.3* 5.3*  ALBUMIN <1.0* 1.0* <1.0*   No results for input(s): LIPASE, AMYLASE in the last 168 hours. Recent Labs  Lab 02/07/20 1915  AMMONIA 51*    ABG    Component Value Date/Time   PHART 7.203 (L) 02/22/2020 0744   PCO2ART 47.6 02-22-2020 0744   PO2ART 87.0 Feb 22, 2020 0744   HCO3 18.2 (L) 02-22-2020 0744   TCO2 19 (L) 2020/02/22 0744   ACIDBASEDEF 9.0 (H) 02/22/20 0744   O2SAT 92.0 02-22-20 0744     Coagulation Profile: Recent Labs  Lab 02/04/20 1921 01/18/2020 0802  INR 1.4* 1.6*    Cardiac Enzymes: No results for input(s): CKTOTAL, CKMB, CKMBINDEX, TROPONINI in the last 168 hours.  HbA1C: Hgb A1c MFr Bld  Date/Time Value Ref Range Status  02/04/2020 07:21 PM 5.1 4.8 - 5.6 % Final    Comment:    (NOTE) Pre diabetes:          5.7%-6.4% Diabetes:              >6.4% Glycemic control for   <7.0% adults with diabetes   02/26/2017 04:52 AM 5.4 4.8 - 5.6 % Final    Comment:    (NOTE)         Pre-diabetes: 5.7 - 6.4         Diabetes: >6.4         Glycemic control for adults with diabetes: <7.0     CBG: Recent Labs  Lab 01/14/2020 1524 01/16/2020 1951 01/31/2020 2342 02-22-20 0425 February 22, 2020 0719  GLUCAP 89 100* 104* 97 93     Critical care time: 50 minutes  Georgann Housekeeper, AGACNP-BC [place]  See Amion for personal pager PCCM on call pager [phone]  03-05-2020 10:35 AM

## 2020-02-12 NOTE — Progress Notes (Signed)
[place] Progress Note Patient Name: Megan Ortiz DOB: 1976-03-10 MRN: [id number]   Date of Service  Feb 25, 2020  HPI/Events of Note  Multiple issues: 1. ABG on 60%/PRVC 28/TV 500/P 10 = 7.212/43.9/87.0 and 2. Sinus Tachycardia with HR = 140. Temp = 103.3 F. Increased HR likely related to sepsis and fever. CVP = 10.  eICU Interventions  Will order: 1. NaHCO3 100 meq IV now.  2. Bolus with 0.9 NaCl 1 liter IV over 1 hour now.  3. Control fever. 4. Repeat ABG at 8 AM.     Intervention Category Major Interventions: Acid-Base disturbance - evaluation and management;Respiratory failure - evaluation and management  Makalah Asberry Cornelia Copa 02/25/20, 5:12 AM

## 2020-02-12 NOTE — Progress Notes (Signed)
Wound care completed with surgery PA. Patient tolerated well. See images updated in chart. Abdominal wound now open post surgery tunneling noted. . Large sacral wound noted with tunneling. See other wound care notes. See wound care nursing notes.

## 2020-02-12 NOTE — Progress Notes (Signed)
Dr Lamonte Sakai notified of that patient is on hight limits of Pressors and made aware of patient low B/P. See vitals no furthers orders given.

## 2020-02-12 NOTE — Progress Notes (Signed)
[place] Progress Note Patient Name: Raeven Pint DOB: [birth year] MRN: [id number]   Date of Service  2020/02/23  HPI/Events of Note  Hypotension - BP = 63/24 with MAP = 37.   eICU Interventions  Will order: 1. NaHCO3 200 meq IV now. 2. ABG already ordered for 12 midnight.     Intervention Category Major Interventions: Hypotension - evaluation and management  Lysle Dingwall 2020-02-23, 10:31 PM

## 2020-02-12 NOTE — Progress Notes (Signed)
ABG ordered for 0800 this AM.  Sample obtained on ventilator settings of VT: 500, RR: 32, FIO2: 60%, and PEEP: 10.  Results given to MD.  No further changes at this time per MD.     Ref. Range 03-03-2020 07:44  Sample type Unknown ARTERIAL  pH, Arterial Latest Ref Range: 7.350 - 7.450  7.203 (L)  pCO2 arterial Latest Ref Range: 32.0 - 48.0 mmHg 47.6  pO2, Arterial Latest Ref Range: 83.0 - 108.0 mmHg 87.0  TCO2 Latest Ref Range: 22 - 32 mmol/L 19 (L)  Acid-base deficit Latest Ref Range: 0.0 - 2.0 mmol/L 9.0 (H)  Bicarbonate Latest Ref Range: 20.0 - 28.0 mmol/L 18.2 (L)  O2 Saturation Latest Units: % 92.0  Patient temperature Unknown 103.0 F  Collection site Unknown ARTERIAL LINE

## 2020-02-12 NOTE — Progress Notes (Signed)
Wasted remaining 250 ml of Fentanyl with Linwood Dibbles RN

## 2020-02-12 NOTE — Progress Notes (Signed)
1 Day Post-Op  Subjective: CC: Intubated. On pressors (vaso and norepi). Septic overnight (febrile to 103, tachycardia etc as below). WBC 66.7.  Objective: Vital signs in last 24 hours: Temp:  [99.1 F (37.3 C)-103.3 F (39.6 C)] 103 F (39.4 C) (03/31 0721) Pulse Rate:  [126-141] 141 (03/31 0710) Resp:  [20-37] 32 (03/31 0710) BP: (78-122)/(33-84) 122/38 (03/31 0710) SpO2:  [69 %-100 %] 97 % (03/31 0710) Arterial Line BP: (64-121)/(14-66) 106/40 (03/31 0500) FiO2 (%):  [40 %-100 %] 60 % (03/31 0710) Last BM Date: 02/05/20  Intake/Output from previous day: 03/30 0701 - 03/31 0700 In: 5069.6 [I.V.:3932.4; Blood:320; NG/GT:190; IV Piggyback:627.2] Out: 860 [Urine:660; Blood:200] Intake/Output this shift: No intake/output data recorded.    Vent Mode: PRVC FiO2 (%):  [40 %-100 %] 60 % Set Rate:  [28 bmp-32 bmp] 32 bmp Vt Set:  [450 mL-500 mL] 500 mL PEEP:  [5 cmH20-10 cmH20] 10 cmH20 Pressure Support:  [5 cmH20-10 cmH20] 5 cmH20 Plateau Pressure:  [25 cmH20] 25 cmH20  Blood pressure (!) 122/38, pulse (!) 141, temperature (!) 103 F (39.4 C), temperature source Axillary, resp. rate (!) 32, height 5\' 5"  (1.651 m), weight (!) 179.2 kg, SpO2 97 %.  PE: Gen: On vent.  Abd: See pictures below with descriptions. Entire pannus essentially open with wound that has overall healthy appearing tissue. There is >75% fatty/fibrinous tissue with some granulation tissue at edges and spread throughout. Tunneling noted ~10cm on left lower wound edge. There is no drainage or dishwater like fluid. Small amount of bruising to left lower lateral wound edge, otherwise skin edges appear healthy Back/Msk: Right thigh wound appears healthy, measuring 4x3cm. No current signs of infection. Right buttock wound as below appears healthy with no drainage or signs of infection. Multiple noted areas of skin tears that were dressed by nursing staff.  All open wound dressed with WTD dressing, dry abd pads and  tape to secure.    Entire pannus   Left pannus  Right pannus   Right back/buttock  Right hip/thigh    Lab Results:  Recent Labs    01/20/2020 2058 03-03-20 0414 03/03/20 0443 March 03, 2020 0744  WBC 67.9*  --  66.7*  --   HGB 6.9*   < > 7.4* 7.8*  HCT 22.8*   < > 25.4* 23.0*  PLT 424*  --  371  --    < > = values in this interval not displayed.   BMET Recent Labs    02/09/20 0101 02/09/20 0113 02/09/2020 0930 01/23/2020 1840 03-03-2020 0414 03-03-20 0744  NA 132*   < > 132*   < > 134* 136  K 4.1   < > 3.6   < > 4.0 3.7  CL 101  --  98  --   --   --   CO2 19*  --  16*  --   --   --   GLUCOSE 141*  --  141*  --   --   --   BUN 32*  --  37*  --   --   --   CREATININE 1.29*  --  1.43*  --   --   --   CALCIUM 7.4*  --  7.0*  --   --   --    < > = values in this interval not displayed.   PT/INR No results for input(s): LABPROT, INR in the last 72 hours. CMP     Component Value Date/Time   NA  136 03-11-20 0744   NA 132 (A) 03/09/2017 0000   K 3.7 03-11-20 0744   CL 98 01/17/2020 0930   CO2 16 (L) 01/20/2020 0930   GLUCOSE 141 (H) 01/20/2020 0930   BUN 37 (H) 02/02/2020 0930   BUN 10 03/09/2017 0000   CREATININE 1.43 (H) 01/31/2020 0930   CALCIUM 7.0 (L) 01/30/2020 0930   PROT 5.3 (L) 01/28/2020 0930   ALBUMIN <1.0 (L) 01/13/2020 0930   AST 28 01/22/2020 0930   ALT 27 02/09/2020 0930   ALKPHOS 124 02/02/2020 0930   BILITOT 1.0 01/29/2020 0930   GFRNONAA 44 (L) 02/06/2020 0930   GFRAA 51 (L) 01/15/2020 0930   Lipase     Component Value Date/Time   LIPASE 34 05/29/2019 1920       Studies/Results: DG Chest Port 1 View  Result Date: 02/06/2020 CLINICAL DATA:  Respiratory failure with hypoxia. Tachycardia. Morbid obesity. EXAM: PORTABLE CHEST 1 VIEW COMPARISON:  01/12/2020 FINDINGS: Moderate patient rotation left. Endotracheal tube terminates 2.5 cm above carina. Feeding tube is difficult to visualized distally. Cardiomegaly accentuated by AP portable  technique. No right and no definite left pleural effusion. No pneumothorax. Given differences in technique, no significant change in relatively diffuse interstitial and airspace disease. IMPRESSION: Cardiomegaly with similar diffuse interstitial and airspace disease. Pneumonia and/or alveolar pulmonary edema. Electronically Signed   By: Abigail Miyamoto M.D.   On: 02/01/2020 17:22    Anti-infectives: Anti-infectives (From admission, onward)   Start     Dose/Rate Route Frequency Ordered Stop   02/07/20 2230  clindamycin (CLEOCIN) IVPB 600 mg     600 mg 100 mL/hr over 30 Minutes Intravenous Every 8 hours 02/07/20 2127     02/07/20 1400  metroNIDAZOLE (FLAGYL) tablet 500 mg  Status:  Discontinued     500 mg Oral Every 8 hours 02/07/20 1309 02/07/20 1343   02/07/20 1400  metroNIDAZOLE (FLAGYL) IVPB 500 mg  Status:  Discontinued     500 mg 100 mL/hr over 60 Minutes Intravenous Every 8 hours 02/07/20 1343 02/07/20 2134   02/07/20 1330  ceFEPIme (MAXIPIME) 2 g in sodium chloride 0.9 % 100 mL IVPB     2 g 200 mL/hr over 30 Minutes Intravenous Every 8 hours 02/07/20 1243     02/06/20 2200  vancomycin (VANCOREADY) IVPB 1750 mg/350 mL     1,750 mg 175 mL/hr over 120 Minutes Intravenous Every 24 hours 02/06/20 1034     02/06/20 0153  vancomycin variable dose per unstable renal function (pharmacist dosing)  Status:  Discontinued      Does not apply See admin instructions 02/06/20 0153 02/06/20 1034   02/04/20 2000  piperacillin-tazobactam (ZOSYN) IVPB 3.375 g  Status:  Discontinued     3.375 g 12.5 mL/hr over 240 Minutes Intravenous Every 8 hours 02/04/20 1826 02/07/20 1228   02/04/20 1730  piperacillin-tazobactam (ZOSYN) IVPB 3.375 g  Status:  Discontinued     3.375 g 12.5 mL/hr over 240 Minutes Intravenous Every 8 hours 02/04/20 1617 02/04/20 1826   02/04/20 0800  vancomycin (VANCOREADY) IVPB 2000 mg/400 mL  Status:  Discontinued     2,000 mg 200 mL/hr over 120 Minutes Intravenous Every 12 hours  01/28/2020 1908 02/06/20 0153   01/15/2020 1930  vancomycin (VANCOCIN) 2,500 mg in sodium chloride 0.9 % 500 mL IVPB     2,500 mg 250 mL/hr over 120 Minutes Intravenous  Once 02/05/2020 1858 02/06/2020 2138   01/31/2020 1830  cefTRIAXone (ROCEPHIN) 2 g in sodium chloride  0.9 % 100 mL IVPB     2 g 200 mL/hr over 30 Minutes Intravenous  Once 02/01/2020 1819 01/12/2020 1956       Assessment/Plan Hx DVT and PE, with IVC filter - Currently on heparin drip Pericardial Effusion VDRF ABL Anemia - - Hgb 7.4. Trend. s/p 2U 3/23, 2U 3/24, 1U 3/29, 1U 3/30 - Per CCM -   Sepsis Necrotizing abdominal wall infection of pannus - S/p EXCISIONAL DEBRIDEMENT OF SKIN, SOFT TISSUE, MUSCLE OF NECROTIZING ABDOMINAL WALL INFECTION/panniculectomy (57cm x 16 cm x15 cm) - Dr. Redmond Pulling - 01/18/2020 - s/p SHARP EXCISIONAL DEBRIDEMENT ABDOMINAL WALL; 20 CM X 25 CM X 10 CM AND 10 CM X 10 CM X 10 CM SKIN AND UNDERLYING SOFT TISSUE - Dr. Ninfa Linden - 02/06/2020 - Continue WTD BID - Cont abx. Cx's with Proteus Mirabilis and E. Coli. Discussed with Pharmacy, will continue Cefepime and change to Linezolid.  - Plan to take back to the OR in the AM   FEN - NPO, NGT VTE - SCDs, Heparin gtt. Hold in AM (discussed with pharmacy) ID - Currently Cefepime/Zyvoix. Febrile at 103. WBC 66.7 Foley - In place I called and updated the patients husband     LOS: 8 days    Jillyn Ledger , [facility] Surgery 03-04-20, 8:28 AM Please see Amion for pager number during day hours 7:00am-4:30pm

## 2020-02-12 NOTE — Progress Notes (Signed)
Per order RT did a one way extubation with fellow RT at bedside and family member.

## 2020-02-12 NOTE — Progress Notes (Signed)
PCCM Interval Progress Note  Called to see pt and speak with husband.  Pt with refractory shock and acidemia despite multiple bicarb pushes and 3 vasopressors (vaso, neo, levo).  MAP in 40's.  Pt unresponsive with no sedation.  I have had an extensive bedside discussion with pt's husband (Megan Ortiz) regarding the current circumstances, multiorgan failure, and extremely poor prognosis for Megan Ortiz.  We also discussed the patient's prior wishes under circumstances such as this.  While he was hopeful for a miracle, he now realizes that she is suffering and has been through an aweful lot.  He has agreed to transition to full comfort care.  He has been fully updated on the process and expectations and all questions have been answered.   Montey Hora, [facility] Mar 03, 2020, 11:15 PM

## 2020-02-12 NOTE — Progress Notes (Signed)
Pt status changed to DNR and comfort care with one way extubation. Md at bedside to prepare patient and significant other with explanation and expected outcomes. ETT pulled and removed by RT at 2337. Two RN verification of no audible breath sounds and palpable pulse. Time of death 23:41. [facility] donor notified.  Offered emotional support to family member. Patients family did not want patients belongings and was told he had everything he needed and to throw away other belongings.

## 2020-02-12 NOTE — Progress Notes (Signed)
[place] for Heparin Indication: History of PE and DVT   Allergies  Allergen Reactions  . Aspirin     Due to hx of stomach ulcers  . Coumarin     Patient Measurements: Height: 5\' 5"  (165.1 cm) Weight: (!) 395 lb (179.2 kg) IBW/kg (Calculated) : 57 Heparin Dosing Weight: 118kg  Vital Signs: Temp: 101.6 F (38.7 C) (03/30 2344) Temp Source: Oral (03/30 2344) BP: 78/64 (03/30 1700) Pulse Rate: 137 (03/31 0015)  Labs: Recent Labs     0000 01/21/2020 0802 01/26/2020 1407 01/15/2020 1537 01/14/2020 1818 01/17/2020 2120 01/16/2020 2243 02/09/20 0101 02/09/20 0113 02/09/20 1153 02/09/20 1213 01/31/2020 0335 01/31/2020 0335 01/18/2020 0930 01/14/2020 1840 01/16/2020 1840 01/17/2020 2050 01/30/2020 2058 2020-02-18 0010  HGB  --   --   --   --    < > 7.0*   < > 6.9*   < > 7.6*   < > 7.1*   < >  --  8.2*   < > 11.9* 6.9*  --   HCT  --   --   --   --    < > 23.9*   < > 23.3*   < > 25.5*   < > 24.0*   < >  --  24.0*  --  35.0* 22.8*  --   PLT   < >  --   --   --   --  458*   < > 481*   < > 453*  --  398  --   --   --   --   --  424*  --   APTT  --   --   --  34  --   --   --   --   --   --   --   --   --   --   --   --   --   --   --   LABPROT  --  19.2*  --   --   --   --   --   --   --   --   --   --   --   --   --   --   --   --   --   INR  --  1.6*  --   --   --   --   --   --   --   --   --   --   --   --   --   --   --   --   --   HEPARINUNFRC  --   --   --  0.49  --   --   --   --   --   --   --   --   --   --   --   --   --   --  0.24*  CREATININE   < > 1.09*  --   --   --  1.23*  --  1.29*  --   --   --   --   --  1.43*  --   --   --   --   --   TROPONINIHS  --   --  15  --   --   --   --   --   --   --   --   --   --   --   --   --   --   --   --    < > =  values in this interval not displayed.    Estimated Creatinine Clearance: 83.9 mL/min (A) (by C-G formula based on SCr of 1.43 mg/dL (H)).  Assessment: Patient with history of DVT/PE with IVC  filter placement currently admitted with general malaise, presenting septic with leukocytosis. Her apixaban is being held with general surgery consulting for potential surgical intervention with her wounds.   Patient had further surgery this am - ok to resume heparin in 6 hours per surgeon  3/31 AM update:  -Heparin level just below goal after re-start -Hgb down some post-op to 6.9-CCM aware-already ordered blood-continuing heparin for now   Goal of Therapy:  Heparin level 0.3-0.7 units/ml Monitor platelets by anticoagulation protocol: Yes   Plan:  Inc heparin to 2200 units/hr Re-check heparin level at 0900 T[facility] Hgb after PRBCs  Narda Bonds, PharmD, BCPS Clinical Pharmacist Phone: [phone]

## 2020-02-12 DEATH — deceased

## 2020-02-13 LAB — VITAMIN C: Vitamin C: 0.2 mg/dL — ABNORMAL LOW (ref 0.4–2.0)

## 2020-02-14 LAB — AEROBIC/ANAEROBIC CULTURE W GRAM STAIN (SURGICAL/DEEP WOUND)

## 2020-02-20 LAB — VITAMIN A: Vitamin A (Retinoic Acid): <2.5 ug/dL — ABNORMAL LOW (ref 20.1–62.0)

## 2020-03-13 DEATH — deceased

## 2020-03-24 DIAGNOSIS — E872 Acidosis, unspecified: Secondary | ICD-10-CM | POA: Diagnosis not present

## 2020-04-13 NOTE — Death Summary Note (Signed)
  DEATH SUMMARY   Patient Details  Name: Megan Ortiz MRN: [id number] DOB: Nov 17, 1975  Admission/Discharge Information   Admit Date:  02-19-2020  Date of Death: Date of Death: 02/27/2020  Time of Death:    Length of Stay: 02-05-23  Referring Physician: Larene Beach, MD   Reason(s) for Hospitalization  Weakness, malaise  Diagnoses  Preliminary cause of death:  Secondary Diagnoses (including complications and co-morbidities):  Principal Problem:   Septic shock ([place]) Active Problems:   Benign essential HTN   Hyperglycemia   Leg DVT (deep venous thromboembolism), acute, right ([place])   Acute renal failure (ARF) ([facility])   Anemia of chronic disease   Morbid obesity ([place])   Sepsis ([place])   Calciphylaxis   Severe sepsis ([place])   Protein-calorie malnutrition, severe   Acute respiratory failure with hypoxia ([place])   Necrotizing fasciitis ([place])   Lactic acidosis   Brief Hospital Course (including significant findings, care, treatment, and services provided and events leading to death)  Megan Ortiz is a 44 y.o. morbidly obese woman with a history of calciphylaxis, DVT/PE, admitted with sepsis which devolved to severe septic shock, acute respiratory failure requiring mechanical ventilation.    Source identified as necrotizing fasciitis and panniculitis.  She required urgent debridement, has been back to the OR for further debridement, most recently 3/30.    She unfortunately progressively worsened with refractory shock on norepinephrine, phenylephrine, vasopressin even on broad-spectrum antibiotics. She became more difficult to ventilate adequately, had a combined metabolic and respiratory acidosis.    Despite maximal support she failed to improve.  Decision was made to transition to comfort care on February 27, 2020. She was extubated at that time and expired 02/18/2020.   Megan Ortiz 03/24/2020, 3:40 PM

## 2020-07-23 IMAGING — CR PORTABLE CHEST - 1 VIEW
1 series · 2 of 2 positions shown · non-contrast
Comparison: February 21, 2017

CLINICAL DATA: Hypotension

EXAM:
PORTABLE CHEST 1 VIEW

[Series 1: portable · 0.17mm/px · 2 of 2 slices shown]
[im 1/2]
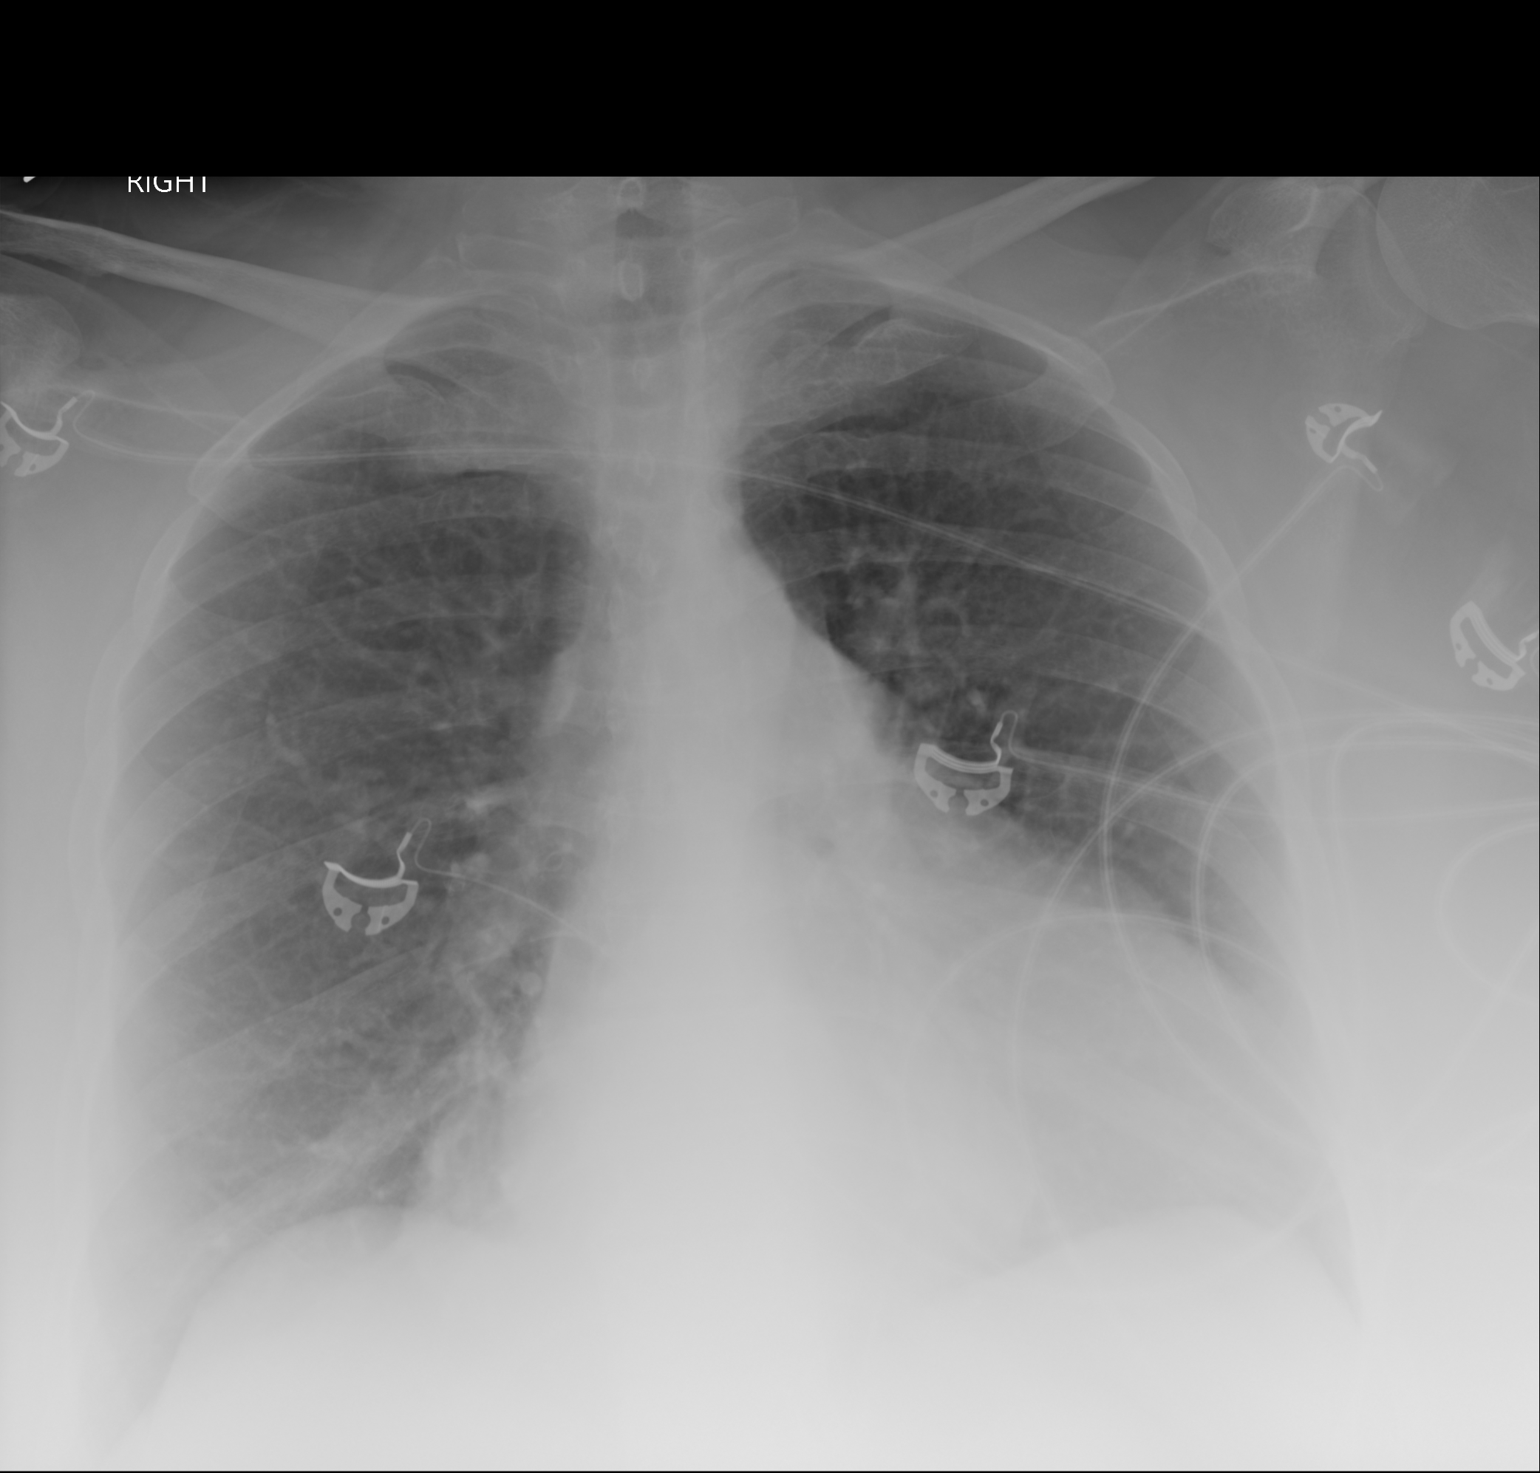
[im 2/2]
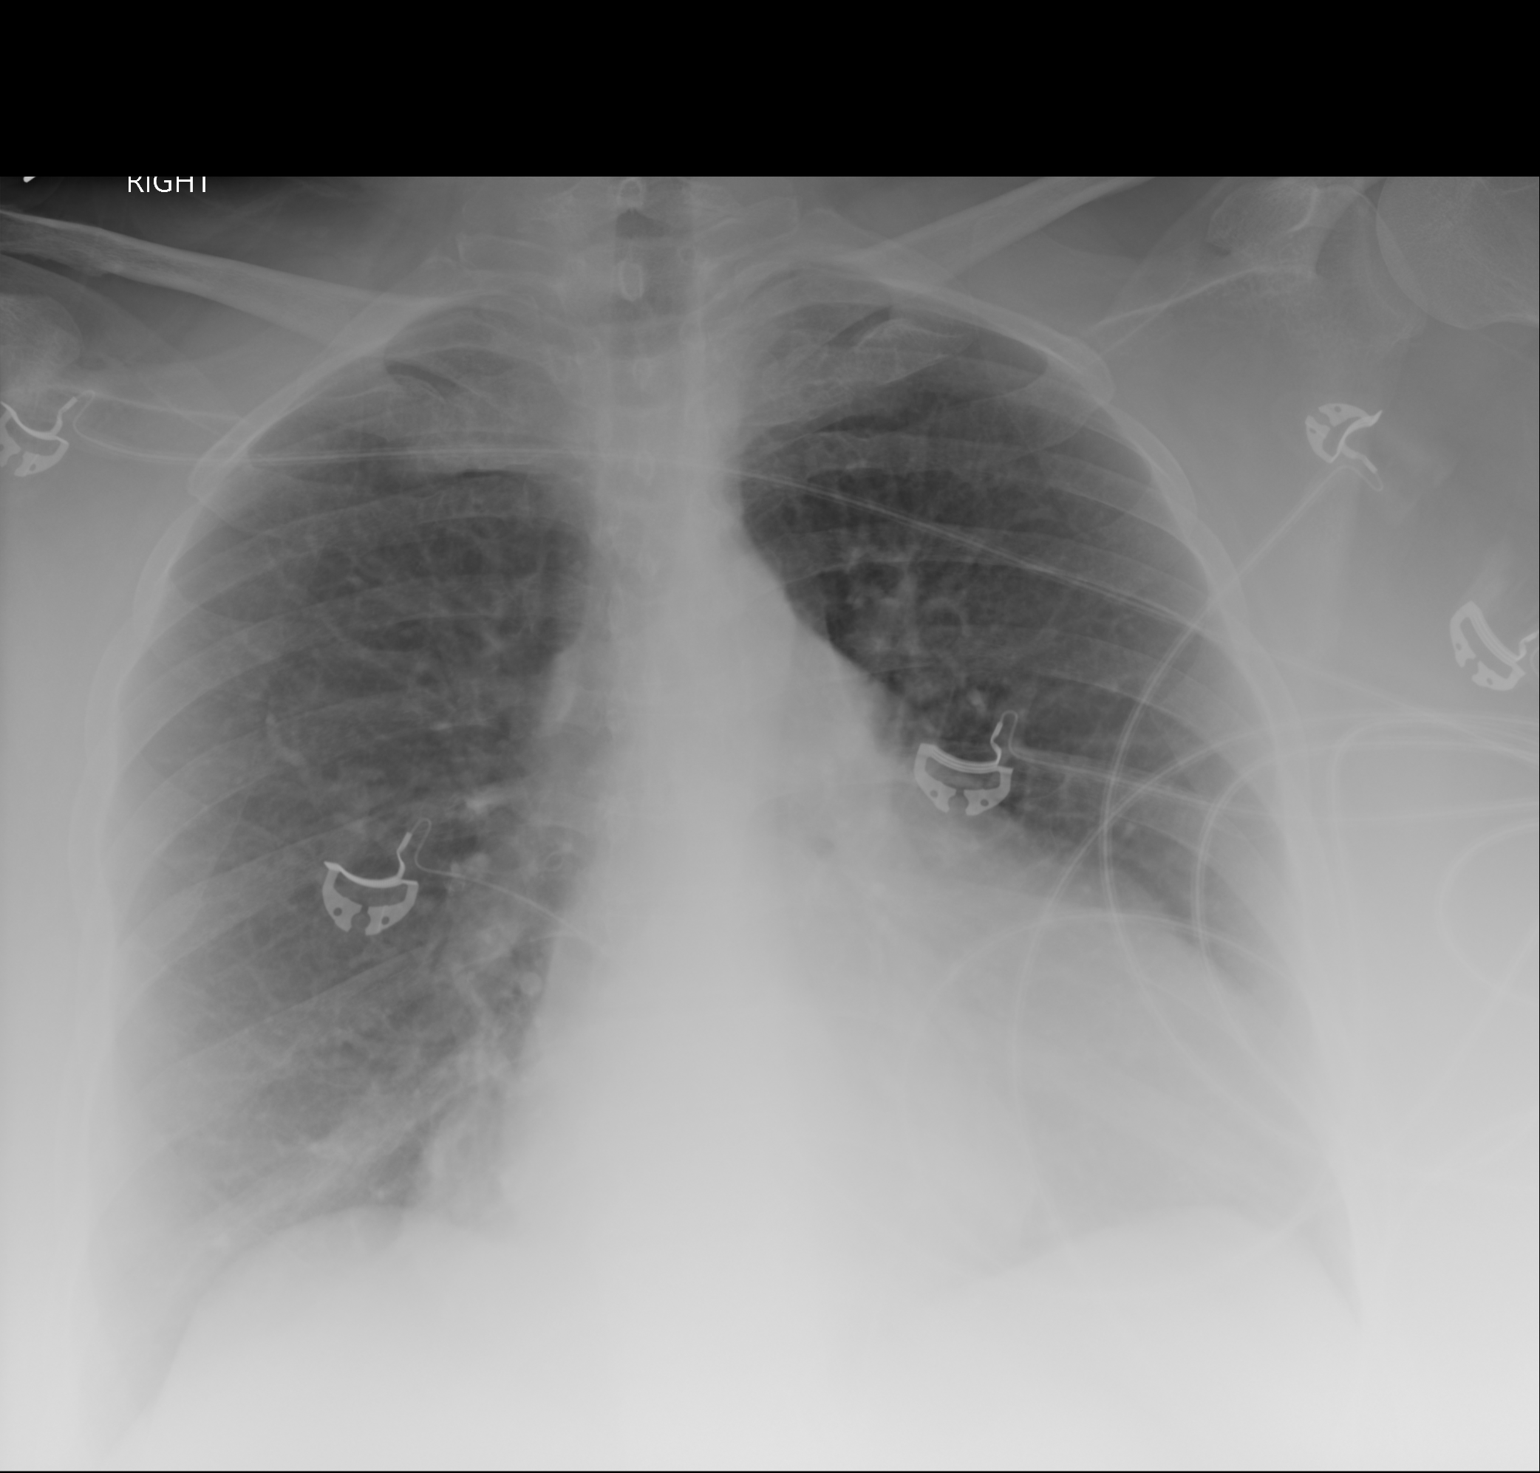

[2 of 2 positions shown; findings below may reference images not displayed]

FINDINGS: Airspace opacity is noted in the medial left base. There is
cardiomegaly with pulmonary vascularity within normal limits. No
adenopathy. No bone lesions.
IMPRESSION: Opacity medial left base concerning for consolidation/pneumonia.
Lungs elsewhere clear. There is cardiomegaly. No adenopathy evident.

## 2020-07-23 IMAGING — CT CT ANGIOGRAPHY CHEST
2 of 9 series · 18 of 46 positions shown · IV contrast (APPLIED)
Comparison: None.

CLINICAL DATA: Chest pain

EXAM:
CT ANGIOGRAPHY CHEST WITH CONTRAST
TECHNIQUE: Multidetector CT imaging of the chest was performed using the
standard protocol during bolus administration of intravenous
contrast. Multiplanar CT image reconstructions and MIPs were
obtained to evaluate the vascular anatomy.
CONTRAST:  100mL OMNIPAQUE IOHEXOL 350 MG/ML SOLN

[Series 7: thins · axial · 0.83mm/px · z∈[+1360,+1641]mm · 15 of 311 slices shown]
[im 15/311  lung]
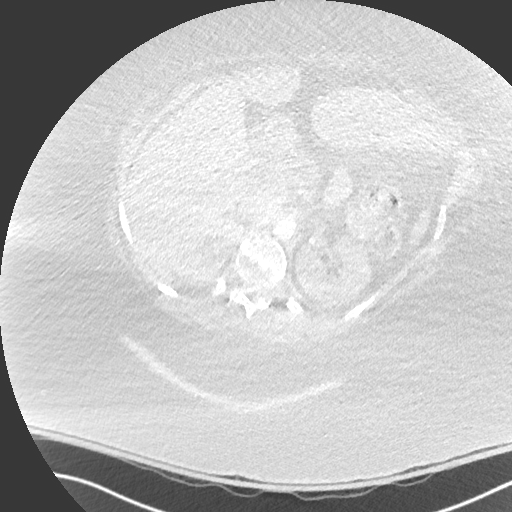
[im 43/311  soft-tissue]
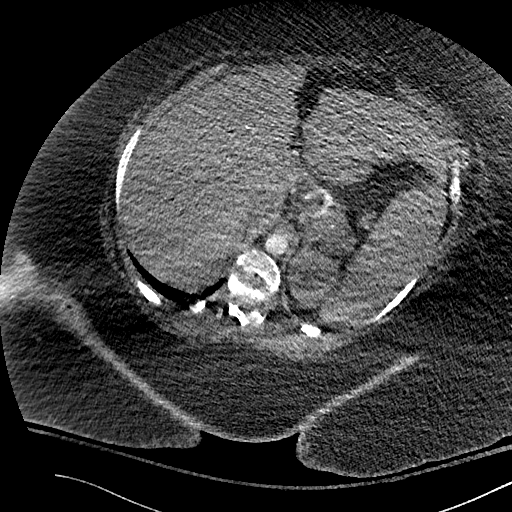
[im 57/311  lung]
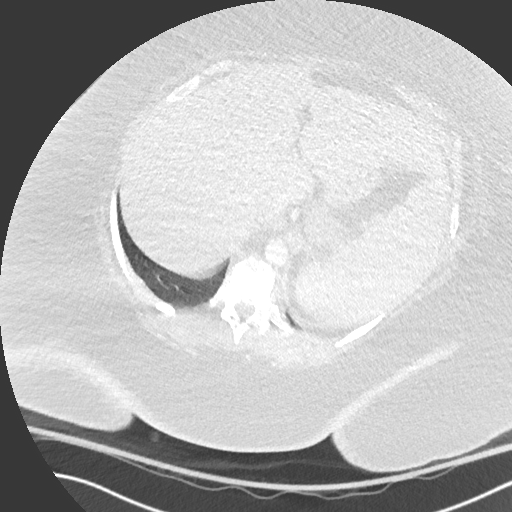
[im 71/311  soft-tissue]
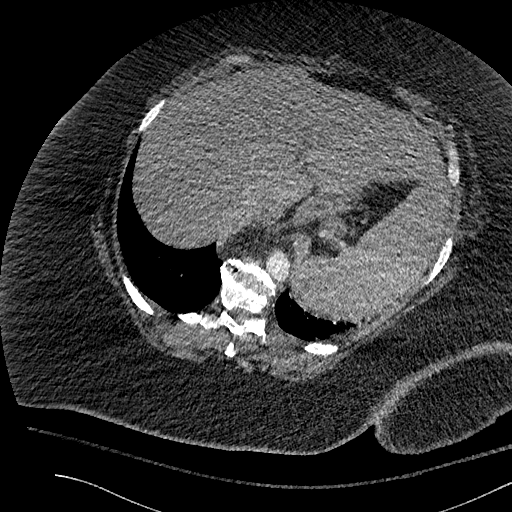
[im 99/311  lung]
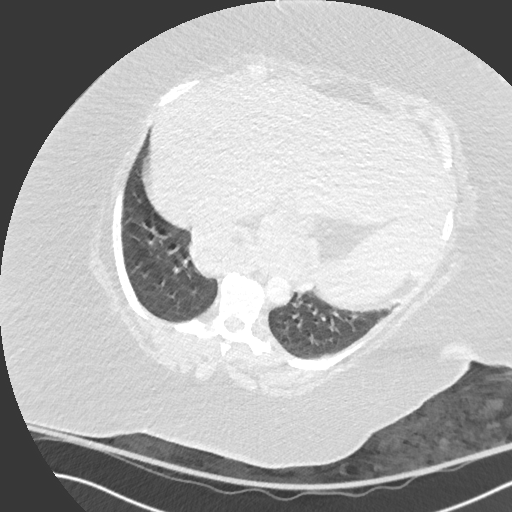
[im 113/311  soft-tissue]
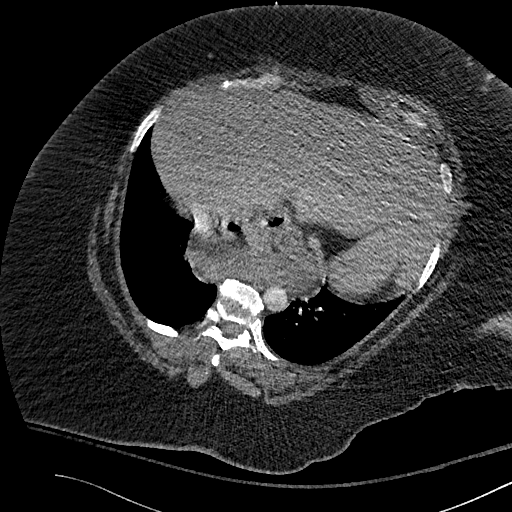
[im 141/311  lung]
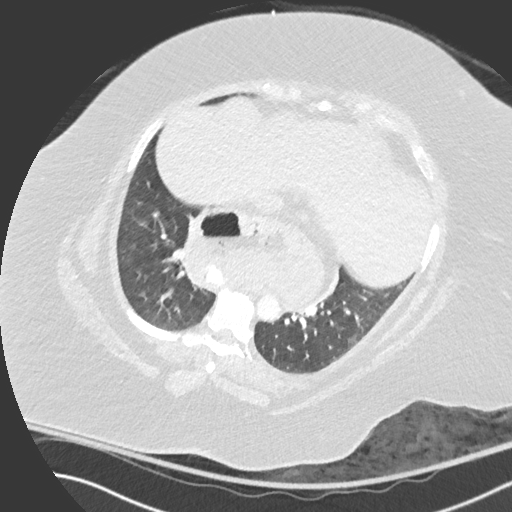
[im 156/311  soft-tissue]
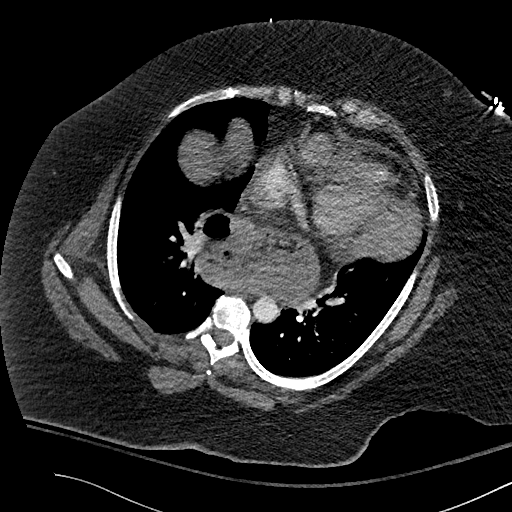
[im 170/311  lung]
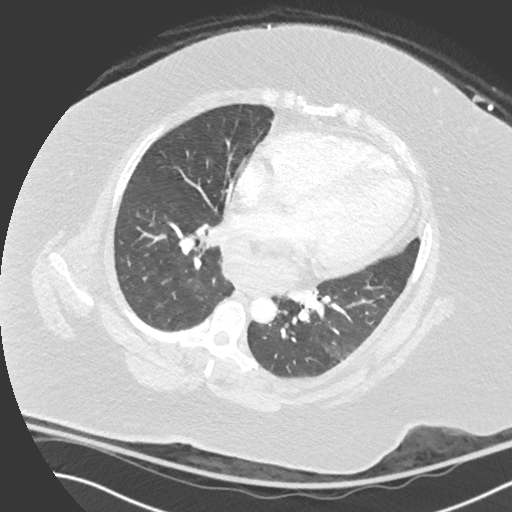
[im 198/311  soft-tissue]
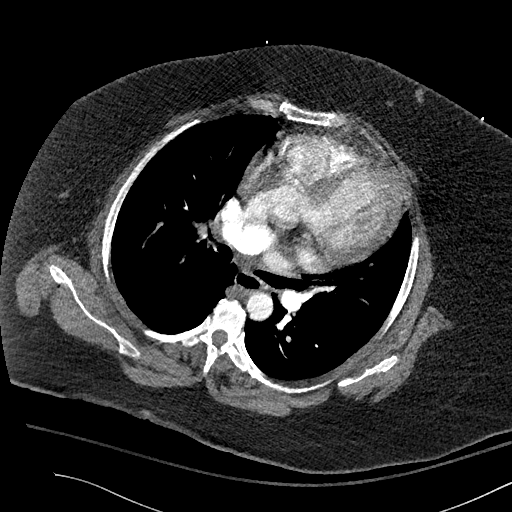
[im 212/311  lung]
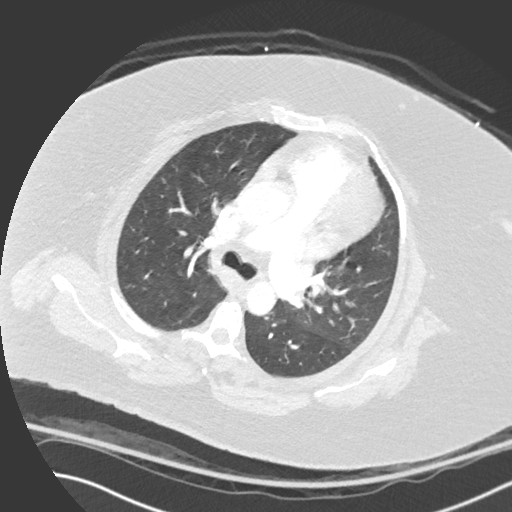
[im 240/311  soft-tissue]
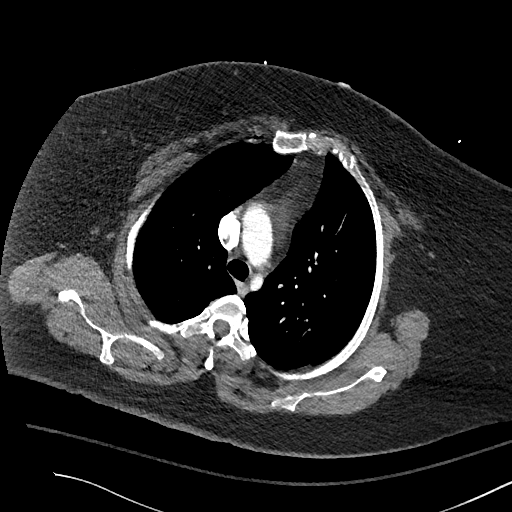
[im 254/311  lung]
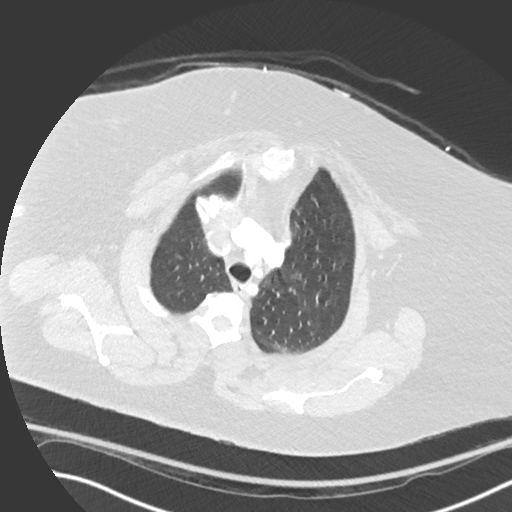
[im 268/311  soft-tissue]
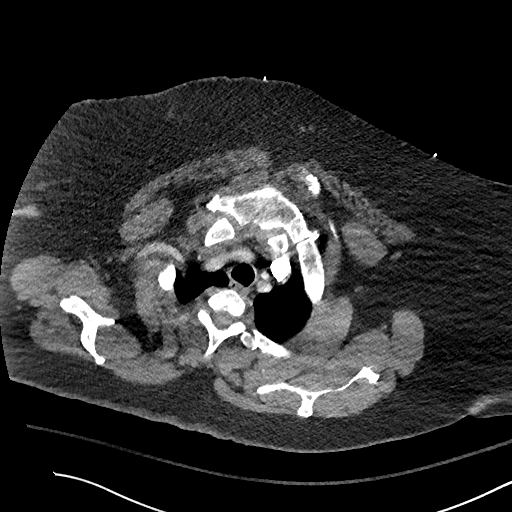
[im 296/311  lung]
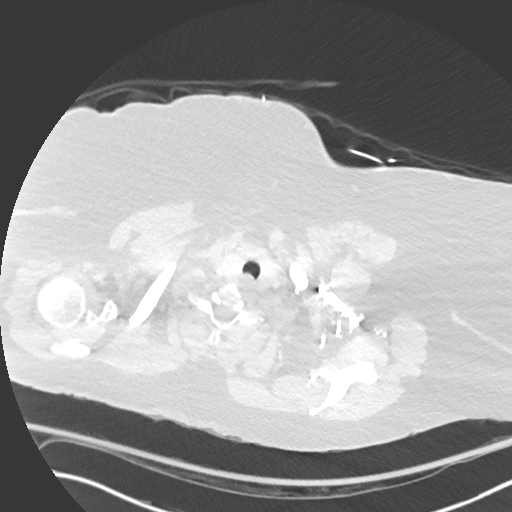

[Series 9: coronal mpr · coronal · 0.61mm/px · 3 of 151 slices shown]
[im 38/151  soft-tissue]
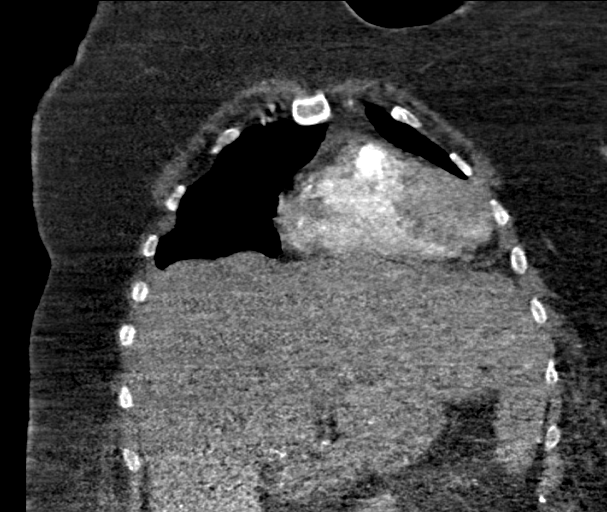
[im 76/151  soft-tissue]
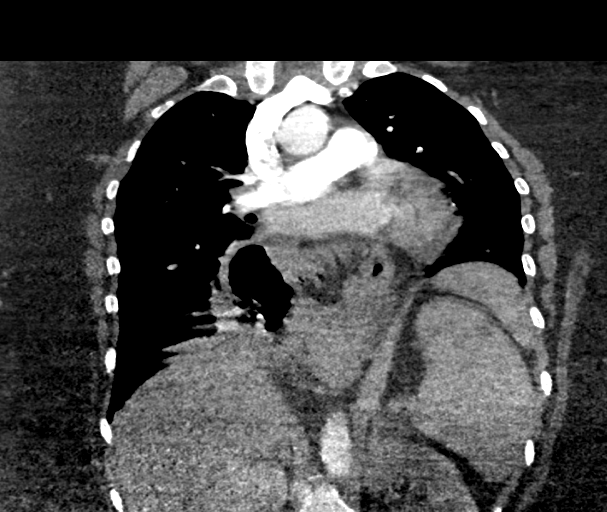
[im 113/151  soft-tissue]
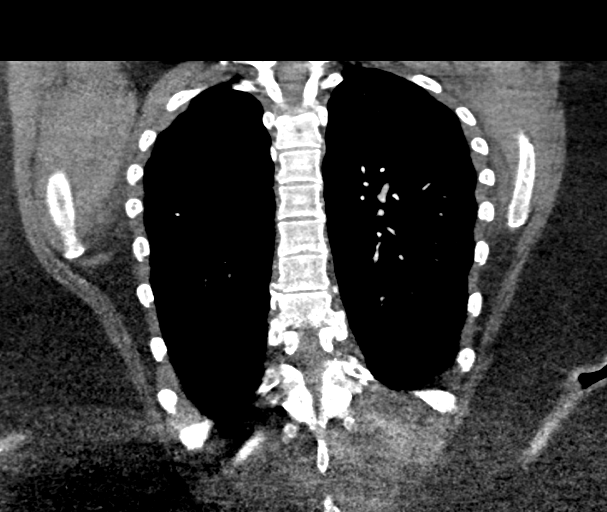

[18 of 46 positions shown; findings below may reference images not displayed]

FINDINGS: Cardiovascular: The thoracic aorta shows no aneurysmal dilatation or
evidence of dissection. No cardiac enlargement is seen. No coronary
calcifications are noted. Pulmonary artery shows a normal branching
pattern without intraluminal filling defect to suggest pulmonary
embolism.

Mediastinum/Nodes: Large hiatal hernia is noted with approximately
50% of the stomach within the chest cavity. Thoracic inlet is within
normal limits. No hilar or mediastinal adenopathy is noted.

Lungs/Pleura: Lungs are well aerated bilaterally. No focal
infiltrate or sizable effusion is seen. No parenchymal nodules are
noted.

Upper Abdomen: Visualized upper abdomen shows the large hiatal
hernia. There is a focus of increased attenuation identified
posteriorly best seen on image number 177 of series 7. It would be
difficult to exclude the possibility of gastric hemorrhage based on
this density.

Musculoskeletal: No acute bony abnormality is noted.

Review of the MIP images confirms the above findings.
IMPRESSION: No evidence of pulmonary emboli.

Large hiatal hernia. There is a dependent density within the hiatal
hernia suspicious for active hemorrhage. Clinical correlation is
recommended.

These results will be called to the ordering clinician or
representative by the Radiologist Assistant, and communication
documented in the PACS or zVision Dashboard.
